# Patient Record
Sex: Female | Born: 1935 | Race: White | Hispanic: No | Marital: Married | Smoking: Never smoker
Health system: Southern US, Community
[De-identification: ages and names within clinical notes are randomized; demographics above are authoritative.]

## PROBLEM LIST (undated history)

## (undated) DIAGNOSIS — I6529 Occlusion and stenosis of unspecified carotid artery: Secondary | ICD-10-CM

## (undated) DIAGNOSIS — K5730 Diverticulosis of large intestine without perforation or abscess without bleeding: Secondary | ICD-10-CM

## (undated) DIAGNOSIS — J189 Pneumonia, unspecified organism: Secondary | ICD-10-CM

## (undated) DIAGNOSIS — I350 Nonrheumatic aortic (valve) stenosis: Secondary | ICD-10-CM

## (undated) DIAGNOSIS — G20A1 Parkinson's disease without dyskinesia, without mention of fluctuations: Secondary | ICD-10-CM

## (undated) DIAGNOSIS — I639 Cerebral infarction, unspecified: Secondary | ICD-10-CM

## (undated) DIAGNOSIS — F419 Anxiety disorder, unspecified: Secondary | ICD-10-CM

## (undated) DIAGNOSIS — M549 Dorsalgia, unspecified: Secondary | ICD-10-CM

## (undated) DIAGNOSIS — E039 Hypothyroidism, unspecified: Secondary | ICD-10-CM

## (undated) DIAGNOSIS — K219 Gastro-esophageal reflux disease without esophagitis: Secondary | ICD-10-CM

## (undated) DIAGNOSIS — N3000 Acute cystitis without hematuria: Secondary | ICD-10-CM

## (undated) DIAGNOSIS — J45909 Unspecified asthma, uncomplicated: Secondary | ICD-10-CM

## (undated) DIAGNOSIS — I10 Essential (primary) hypertension: Secondary | ICD-10-CM

## (undated) DIAGNOSIS — F329 Major depressive disorder, single episode, unspecified: Secondary | ICD-10-CM

## (undated) DIAGNOSIS — F32A Depression, unspecified: Secondary | ICD-10-CM

## (undated) DIAGNOSIS — G20 Parkinson's disease: Secondary | ICD-10-CM

## (undated) DIAGNOSIS — D649 Anemia, unspecified: Secondary | ICD-10-CM

## (undated) DIAGNOSIS — Z8669 Personal history of other diseases of the nervous system and sense organs: Secondary | ICD-10-CM

## (undated) DIAGNOSIS — E785 Hyperlipidemia, unspecified: Secondary | ICD-10-CM

## (undated) DIAGNOSIS — M1990 Unspecified osteoarthritis, unspecified site: Secondary | ICD-10-CM

## (undated) DIAGNOSIS — N390 Urinary tract infection, site not specified: Secondary | ICD-10-CM

## (undated) DIAGNOSIS — W19XXXA Unspecified fall, initial encounter: Secondary | ICD-10-CM

## (undated) DIAGNOSIS — I679 Cerebrovascular disease, unspecified: Secondary | ICD-10-CM

## (undated) DIAGNOSIS — G459 Transient cerebral ischemic attack, unspecified: Secondary | ICD-10-CM

## (undated) DIAGNOSIS — J449 Chronic obstructive pulmonary disease, unspecified: Secondary | ICD-10-CM

## (undated) DIAGNOSIS — R0602 Shortness of breath: Secondary | ICD-10-CM

## (undated) DIAGNOSIS — F0390 Unspecified dementia without behavioral disturbance: Secondary | ICD-10-CM

## (undated) DIAGNOSIS — I272 Pulmonary hypertension, unspecified: Secondary | ICD-10-CM

## (undated) DIAGNOSIS — J209 Acute bronchitis, unspecified: Secondary | ICD-10-CM

## (undated) HISTORY — DX: Nonrheumatic aortic (valve) stenosis: I35.0

## (undated) HISTORY — DX: Anxiety disorder, unspecified: F41.9

## (undated) HISTORY — DX: Diverticulosis of large intestine without perforation or abscess without bleeding: K57.30

## (undated) HISTORY — DX: Gastro-esophageal reflux disease without esophagitis: K21.9

## (undated) HISTORY — DX: Hypothyroidism, unspecified: E03.9

## (undated) HISTORY — DX: Chronic obstructive pulmonary disease, unspecified: J44.9

## (undated) HISTORY — DX: Essential (primary) hypertension: I10

## (undated) HISTORY — DX: Unspecified asthma, uncomplicated: J45.909

## (undated) HISTORY — DX: Depression, unspecified: F32.A

## (undated) HISTORY — PX: COLECTOMY: SHX59

## (undated) HISTORY — DX: Dorsalgia, unspecified: M54.9

## (undated) HISTORY — DX: Acute cystitis without hematuria: N30.00

## (undated) HISTORY — DX: Unspecified osteoarthritis, unspecified site: M19.90

## (undated) HISTORY — DX: Acute bronchitis, unspecified: J20.9

## (undated) HISTORY — DX: Personal history of other diseases of the nervous system and sense organs: Z86.69

## (undated) HISTORY — PX: VAGINAL HYSTERECTOMY: SUR661

## (undated) HISTORY — DX: Pulmonary hypertension, unspecified: I27.20

## (undated) HISTORY — DX: Major depressive disorder, single episode, unspecified: F32.9

## (undated) HISTORY — PX: TUBAL LIGATION: SHX77

## (undated) HISTORY — DX: Transient cerebral ischemic attack, unspecified: G45.9

## (undated) HISTORY — PX: FOOT SURGERY: SHX648

## (undated) HISTORY — DX: Cerebrovascular disease, unspecified: I67.9

## (undated) HISTORY — PX: ENDARTERECTOMY: SHX5162

## (undated) HISTORY — DX: Anemia, unspecified: D64.9

## (undated) HISTORY — PX: REPLACEMENT TOTAL KNEE: SUR1224

---

## 2001-11-11 ENCOUNTER — Ambulatory Visit (HOSPITAL_COMMUNITY): Admission: RE | Admit: 2001-11-11 | Discharge: 2001-11-11 | Payer: Self-pay | Admitting: Pulmonary Disease

## 2001-11-11 ENCOUNTER — Encounter: Payer: Self-pay | Admitting: Pulmonary Disease

## 2001-12-17 ENCOUNTER — Ambulatory Visit (HOSPITAL_COMMUNITY): Admission: RE | Admit: 2001-12-17 | Discharge: 2001-12-17 | Payer: Self-pay | Admitting: Gastroenterology

## 2001-12-24 ENCOUNTER — Encounter: Payer: Self-pay | Admitting: Gastroenterology

## 2001-12-24 ENCOUNTER — Encounter: Admission: RE | Admit: 2001-12-24 | Discharge: 2001-12-24 | Payer: Self-pay | Admitting: Gastroenterology

## 2002-01-19 ENCOUNTER — Encounter: Payer: Self-pay | Admitting: General Surgery

## 2002-01-23 ENCOUNTER — Inpatient Hospital Stay (HOSPITAL_COMMUNITY): Admission: RE | Admit: 2002-01-23 | Discharge: 2002-01-28 | Payer: Self-pay | Admitting: General Surgery

## 2002-01-23 ENCOUNTER — Encounter (INDEPENDENT_AMBULATORY_CARE_PROVIDER_SITE_OTHER): Payer: Self-pay | Admitting: Specialist

## 2003-03-11 ENCOUNTER — Inpatient Hospital Stay (HOSPITAL_COMMUNITY): Admission: EM | Admit: 2003-03-11 | Discharge: 2003-03-12 | Payer: Self-pay | Admitting: Emergency Medicine

## 2003-03-18 ENCOUNTER — Ambulatory Visit (HOSPITAL_COMMUNITY): Admission: RE | Admit: 2003-03-18 | Discharge: 2003-03-18 | Payer: Self-pay | Admitting: Cardiology

## 2004-09-29 ENCOUNTER — Ambulatory Visit: Payer: Self-pay | Admitting: Pulmonary Disease

## 2004-10-03 ENCOUNTER — Ambulatory Visit (HOSPITAL_COMMUNITY): Admission: RE | Admit: 2004-10-03 | Discharge: 2004-10-03 | Payer: Self-pay | Admitting: Pulmonary Disease

## 2004-10-05 ENCOUNTER — Ambulatory Visit: Payer: Self-pay

## 2004-10-16 ENCOUNTER — Ambulatory Visit: Payer: Self-pay | Admitting: Pulmonary Disease

## 2004-11-16 ENCOUNTER — Ambulatory Visit: Payer: Self-pay | Admitting: Pulmonary Disease

## 2005-02-15 ENCOUNTER — Ambulatory Visit: Payer: Self-pay | Admitting: Pulmonary Disease

## 2005-02-21 ENCOUNTER — Ambulatory Visit (HOSPITAL_COMMUNITY): Admission: RE | Admit: 2005-02-21 | Discharge: 2005-02-21 | Payer: Self-pay | Admitting: Pulmonary Disease

## 2005-03-20 ENCOUNTER — Ambulatory Visit (HOSPITAL_COMMUNITY): Admission: RE | Admit: 2005-03-20 | Discharge: 2005-03-20 | Payer: Self-pay | Admitting: Gastroenterology

## 2005-06-13 ENCOUNTER — Ambulatory Visit: Payer: Self-pay | Admitting: Pulmonary Disease

## 2005-10-05 ENCOUNTER — Ambulatory Visit: Payer: Self-pay

## 2006-02-20 ENCOUNTER — Ambulatory Visit: Payer: Self-pay | Admitting: Pulmonary Disease

## 2006-02-21 ENCOUNTER — Ambulatory Visit: Payer: Self-pay | Admitting: Pulmonary Disease

## 2006-07-17 ENCOUNTER — Encounter: Admission: RE | Admit: 2006-07-17 | Discharge: 2006-07-17 | Payer: Self-pay | Admitting: General Surgery

## 2006-08-12 ENCOUNTER — Inpatient Hospital Stay (HOSPITAL_COMMUNITY): Admission: RE | Admit: 2006-08-12 | Discharge: 2006-08-15 | Payer: Self-pay | Admitting: Orthopedic Surgery

## 2006-10-24 ENCOUNTER — Ambulatory Visit: Payer: Self-pay

## 2007-08-13 ENCOUNTER — Telehealth: Payer: Self-pay | Admitting: Pulmonary Disease

## 2007-08-15 DIAGNOSIS — E039 Hypothyroidism, unspecified: Secondary | ICD-10-CM

## 2007-08-15 DIAGNOSIS — K219 Gastro-esophageal reflux disease without esophagitis: Secondary | ICD-10-CM

## 2007-08-15 DIAGNOSIS — M545 Low back pain: Secondary | ICD-10-CM

## 2007-08-15 DIAGNOSIS — I10 Essential (primary) hypertension: Secondary | ICD-10-CM

## 2007-08-15 DIAGNOSIS — M1990 Unspecified osteoarthritis, unspecified site: Secondary | ICD-10-CM

## 2007-08-15 DIAGNOSIS — F411 Generalized anxiety disorder: Secondary | ICD-10-CM

## 2007-08-15 DIAGNOSIS — F329 Major depressive disorder, single episode, unspecified: Secondary | ICD-10-CM

## 2007-10-02 ENCOUNTER — Encounter: Payer: Self-pay | Admitting: Pulmonary Disease

## 2007-10-02 ENCOUNTER — Ambulatory Visit: Payer: Self-pay

## 2007-11-13 ENCOUNTER — Telehealth (INDEPENDENT_AMBULATORY_CARE_PROVIDER_SITE_OTHER): Payer: Self-pay | Admitting: *Deleted

## 2007-11-19 ENCOUNTER — Ambulatory Visit: Payer: Self-pay | Admitting: Pulmonary Disease

## 2007-11-19 DIAGNOSIS — J42 Unspecified chronic bronchitis: Secondary | ICD-10-CM | POA: Insufficient documentation

## 2007-11-19 DIAGNOSIS — K5730 Diverticulosis of large intestine without perforation or abscess without bleeding: Secondary | ICD-10-CM | POA: Insufficient documentation

## 2007-11-19 DIAGNOSIS — I679 Cerebrovascular disease, unspecified: Secondary | ICD-10-CM

## 2007-11-23 LAB — CONVERTED CEMR LAB
AST: 22 units/L (ref 0–37)
Alkaline Phosphatase: 53 units/L (ref 39–117)
BUN: 21 mg/dL (ref 6–23)
Basophils Absolute: 0 10*3/uL (ref 0.0–0.1)
Bilirubin, Direct: 0.1 mg/dL (ref 0.0–0.3)
Eosinophils Absolute: 0.1 10*3/uL (ref 0.0–0.7)
GFR calc non Af Amer: 58 mL/min
Glucose, Bld: 100 mg/dL — ABNORMAL HIGH (ref 70–99)
HCT: 34.6 % — ABNORMAL LOW (ref 36.0–46.0)
Hemoglobin: 12 g/dL (ref 12.0–15.0)
Lymphocytes Relative: 37 % (ref 12.0–46.0)
Monocytes Absolute: 0.4 10*3/uL (ref 0.1–1.0)
Monocytes Relative: 7.7 % (ref 3.0–12.0)
Neutro Abs: 2.6 10*3/uL (ref 1.4–7.7)
Neutrophils Relative %: 53.9 % (ref 43.0–77.0)
Total Protein: 7.2 g/dL (ref 6.0–8.3)
WBC: 5 10*3/uL (ref 4.5–10.5)

## 2008-02-11 ENCOUNTER — Telehealth (INDEPENDENT_AMBULATORY_CARE_PROVIDER_SITE_OTHER): Payer: Self-pay | Admitting: *Deleted

## 2008-08-24 ENCOUNTER — Encounter: Payer: Self-pay | Admitting: Pulmonary Disease

## 2008-10-08 ENCOUNTER — Ambulatory Visit: Payer: Self-pay | Admitting: Pulmonary Disease

## 2008-10-08 ENCOUNTER — Ambulatory Visit: Payer: Self-pay

## 2008-10-20 ENCOUNTER — Telehealth (INDEPENDENT_AMBULATORY_CARE_PROVIDER_SITE_OTHER): Payer: Self-pay | Admitting: *Deleted

## 2008-10-29 ENCOUNTER — Telehealth: Payer: Self-pay | Admitting: Pulmonary Disease

## 2008-11-02 ENCOUNTER — Ambulatory Visit: Payer: Self-pay | Admitting: Pulmonary Disease

## 2008-11-02 ENCOUNTER — Telehealth: Payer: Self-pay | Admitting: Pulmonary Disease

## 2008-11-03 ENCOUNTER — Telehealth (INDEPENDENT_AMBULATORY_CARE_PROVIDER_SITE_OTHER): Payer: Self-pay | Admitting: *Deleted

## 2008-11-16 ENCOUNTER — Ambulatory Visit: Payer: Self-pay | Admitting: Pulmonary Disease

## 2008-11-29 ENCOUNTER — Ambulatory Visit: Payer: Self-pay | Admitting: Pulmonary Disease

## 2008-12-03 LAB — CONVERTED CEMR LAB
ALT: 13 units/L (ref 0–35)
AST: 16 units/L (ref 0–37)
Albumin: 3.3 g/dL — ABNORMAL LOW (ref 3.5–5.2)
Alkaline Phosphatase: 58 units/L (ref 39–117)
BUN: 14 mg/dL (ref 6–23)
Bilirubin, Direct: 0.1 mg/dL (ref 0.0–0.3)
CO2: 30 meq/L (ref 19–32)
Cholesterol: 193 mg/dL (ref 0–200)
Creatinine, Ser: 0.8 mg/dL (ref 0.4–1.2)
GFR calc non Af Amer: 75.08 mL/min (ref >60)
HDL: 74.5 mg/dL (ref >39.00)
LDL Cholesterol: 99 mg/dL (ref 0–99)
Lymphocytes Relative: 32.2 % (ref 12.0–46.0)
Monocytes Relative: 9.1 % (ref 3.0–12.0)
Neutro Abs: 2.6 10*3/uL (ref 1.4–7.7)
Neutrophils Relative %: 55.6 % (ref 43.0–77.0)
Platelets: 281 10*3/uL (ref 150.0–400.0)
Sodium: 143 meq/L (ref 135–145)
Total Protein: 6.8 g/dL (ref 6.0–8.3)

## 2008-12-30 ENCOUNTER — Ambulatory Visit: Payer: Self-pay | Admitting: Surgery

## 2009-01-04 ENCOUNTER — Ambulatory Visit: Payer: Self-pay | Admitting: Vascular Surgery

## 2009-01-10 ENCOUNTER — Ambulatory Visit: Payer: Self-pay | Admitting: Pulmonary Disease

## 2009-01-10 DIAGNOSIS — G459 Transient cerebral ischemic attack, unspecified: Secondary | ICD-10-CM | POA: Insufficient documentation

## 2009-01-10 DIAGNOSIS — H53139 Sudden visual loss, unspecified eye: Secondary | ICD-10-CM

## 2009-01-12 ENCOUNTER — Ambulatory Visit: Payer: Self-pay | Admitting: Vascular Surgery

## 2009-01-12 ENCOUNTER — Inpatient Hospital Stay (HOSPITAL_COMMUNITY): Admission: RE | Admit: 2009-01-12 | Discharge: 2009-01-13 | Payer: Self-pay | Admitting: Vascular Surgery

## 2009-01-12 ENCOUNTER — Encounter: Payer: Self-pay | Admitting: Vascular Surgery

## 2009-01-13 HISTORY — PX: CARDIOTHORACIC PROCEDURE: SHX1298

## 2009-02-01 ENCOUNTER — Ambulatory Visit: Payer: Self-pay | Admitting: Vascular Surgery

## 2009-07-11 ENCOUNTER — Encounter: Payer: Self-pay | Admitting: Pulmonary Disease

## 2009-07-26 ENCOUNTER — Telehealth: Payer: Self-pay | Admitting: Pulmonary Disease

## 2009-08-05 ENCOUNTER — Ambulatory Visit: Payer: Self-pay | Admitting: Vascular Surgery

## 2009-10-13 ENCOUNTER — Telehealth (INDEPENDENT_AMBULATORY_CARE_PROVIDER_SITE_OTHER): Payer: Self-pay | Admitting: *Deleted

## 2010-02-07 ENCOUNTER — Ambulatory Visit: Payer: Self-pay | Admitting: Vascular Surgery

## 2010-07-12 ENCOUNTER — Encounter: Payer: Self-pay | Admitting: Pulmonary Disease

## 2010-08-29 NOTE — Progress Notes (Signed)
Summary: rx's  Phone Note Call from Patient Call back at Home Phone [phone]   Caller: Patient Call For: [name] Reason for Call: Talk to Nurse Summary of Call: pt needs written rx to take to [facility] for : Prilosec 20mg , Premorin 9 mg, Cozaar 50mg  & Synthroid 100mg .  Pt going next week.  Please mail rx to her home. Initial call taken by: Eugene Gavia,  October 13, 2009 9:23 AM  Follow-up for Phone Call        printed all requested rx's for sn to sign except premarin I have called pt but no ansewer on pt's home phone, I don't show where sn has filled premarin for pt before.  Philipp Deputy [facility]  October 13, 2009 10:27 AM    called and spoke with pt and she is aware that the rx for meds have been mailed to her---except for the premarin and she will call Dr. Arlyce Dice for this med. Randell Loop CMA  October 13, 2009 4:34 PM     Prescriptions: PRILOSEC OTC 20 MG  TBEC (OMEPRAZOLE MAGNESIUM) Take 1 tablet by mouth once a day  #90 x 3   Entered by:   Philipp Deputy CMA   Authorized by:   Michele Mcalpine MD   Signed by:   Philipp Deputy CMA on 10/13/2009   Method used:   Print then Give to Patient   RxID:   [id number] SYNTHROID 100 MCG  TABS (LEVOTHYROXINE SODIUM) Take 1 tablet by mouth once a day  #90 x 3   Entered by:   Philipp Deputy CMA   Authorized by:   Michele Mcalpine MD   Signed by:   Philipp Deputy CMA on 10/13/2009   Method used:   Print then Give to Patient   RxID:   [id number] COZAAR 50 MG  TABS (LOSARTAN POTASSIUM) Take 1 tablet by mouth once a day  #90 x 3   Entered by:   Philipp Deputy CMA   Authorized by:   Michele Mcalpine MD   Signed by:   Philipp Deputy CMA on 10/13/2009   Method used:   Print then Give to Patient   RxID:   [id number]

## 2010-08-31 NOTE — Letter (Signed)
[facility]   Imported By: Sherian Rein 07/20/2010 14:10:28  _____________________________________________________________________  External Attachment:    Type:   Image     Comment:   External Document

## 2010-10-09 ENCOUNTER — Encounter: Payer: Self-pay | Admitting: Pulmonary Disease

## 2010-10-09 ENCOUNTER — Other Ambulatory Visit: Payer: Self-pay | Admitting: Pulmonary Disease

## 2010-10-09 ENCOUNTER — Ambulatory Visit (INDEPENDENT_AMBULATORY_CARE_PROVIDER_SITE_OTHER)
Admission: RE | Admit: 2010-10-09 | Discharge: 2010-10-09 | Disposition: A | Discharge status: Home / Self Care | Payer: Medicare Other | Source: Ambulatory Visit | Attending: Pulmonary Disease | Admitting: Pulmonary Disease

## 2010-10-09 ENCOUNTER — Other Ambulatory Visit: Payer: Medicare Other

## 2010-10-09 ENCOUNTER — Ambulatory Visit (INDEPENDENT_AMBULATORY_CARE_PROVIDER_SITE_OTHER): Payer: Medicare Other | Admitting: Pulmonary Disease

## 2010-10-09 DIAGNOSIS — I10 Essential (primary) hypertension: Secondary | ICD-10-CM

## 2010-10-09 DIAGNOSIS — J42 Unspecified chronic bronchitis: Secondary | ICD-10-CM

## 2010-10-09 DIAGNOSIS — E039 Hypothyroidism, unspecified: Secondary | ICD-10-CM

## 2010-10-09 DIAGNOSIS — G459 Transient cerebral ischemic attack, unspecified: Secondary | ICD-10-CM

## 2010-10-09 DIAGNOSIS — I679 Cerebrovascular disease, unspecified: Secondary | ICD-10-CM

## 2010-10-09 DIAGNOSIS — K219 Gastro-esophageal reflux disease without esophagitis: Secondary | ICD-10-CM

## 2010-10-09 DIAGNOSIS — M1990 Unspecified osteoarthritis, unspecified site: Secondary | ICD-10-CM

## 2010-10-09 DIAGNOSIS — K5730 Diverticulosis of large intestine without perforation or abscess without bleeding: Secondary | ICD-10-CM

## 2010-10-09 DIAGNOSIS — M545 Low back pain: Secondary | ICD-10-CM

## 2010-10-09 DIAGNOSIS — J45909 Unspecified asthma, uncomplicated: Secondary | ICD-10-CM | POA: Insufficient documentation

## 2010-10-09 LAB — HEPATIC FUNCTION PANEL
ALT: 10 U/L (ref 0–35)
AST: 13 U/L (ref 0–37)
Albumin: 3.6 g/dL (ref 3.5–5.2)
Alkaline Phosphatase: 60 U/L (ref 39–117)
Bilirubin, Direct: 0.1 mg/dL (ref 0.0–0.3)
Total Protein: 7.1 g/dL (ref 6.0–8.3)

## 2010-10-09 LAB — CBC WITH DIFFERENTIAL/PLATELET
Basophils Absolute: 0 10*3/uL (ref 0.0–0.1)
Eosinophils Absolute: 0.2 10*3/uL (ref 0.0–0.7)
Lymphocytes Relative: 32.5 % (ref 12.0–46.0)
Monocytes Absolute: 0.5 10*3/uL (ref 0.1–1.0)
Neutro Abs: 3.4 10*3/uL (ref 1.4–7.7)
Neutrophils Relative %: 56.3 % (ref 43.0–77.0)
Platelets: 285 10*3/uL (ref 150.0–400.0)
RBC: 3.61 Mil/uL — ABNORMAL LOW (ref 3.87–5.11)
RDW: 14.9 % — ABNORMAL HIGH (ref 11.5–14.6)

## 2010-10-09 LAB — BASIC METABOLIC PANEL
CO2: 29 mEq/L (ref 19–32)
Calcium: 8.9 mg/dL (ref 8.4–10.5)
GFR: 73.63 mL/min (ref >60.00)
Glucose, Bld: 86 mg/dL (ref 70–99)

## 2010-10-09 LAB — TSH: TSH: 0.46 u[IU]/mL (ref 0.35–5.50)

## 2010-10-12 ENCOUNTER — Telehealth: Payer: Self-pay | Admitting: Pulmonary Disease

## 2010-10-17 NOTE — Progress Notes (Signed)
Summary: results-pt returned call  Phone Note Call from Patient Call back at Home Phone [phone]   Caller: Patient Call For: Takisha Pelle Summary of Call: pt wants results of labs/ cxr from 10/09/10 Initial call taken by: Tivis Ringer, CNA,  October 12, 2010 3:15 PM  Follow-up for Phone Call        pt is calling requesting her lab and cxr results that were done on 10/09/2010.  Results are unsigned.  Please advise.  Thank you.  Aundra Millet Reynolds LPN  October 12, 2010 3:16 PM    attempted to call pt with results---line is busy ...will try again later Randell Loop CMA  October 12, 2010 4:07 PM   pt returned call. Tivis Ringer, CNA  October 13, 2010 1:22 PM attmpted to call pt again with results of labs/cxr----no answer this time---machine picks up and states system on---will attempt to call pt back Randell Loop CMA  October 12, 2010 4:37 PM   Additional Follow-up for Phone Call Additional follow up Details #1::        called and spoke with pt about her lab results----PFT--with combined restriction and mild obstruction---all labs look ok---mild anemia that we will follow up with----cxr with some old scarring  NAD no change---pt voiced her understanding of this and will call back for any questions or concerns Randell Loop CMA  October 13, 2010 1:39 PM

## 2010-10-17 NOTE — Assessment & Plan Note (Signed)
Summary: OV SOB//SH   CC:  21 month ROV & review of mult medical problems....  History of Present Illness: 76 y/o WF here for a follow up visit... she is followed both here and at the [place] where she gets her meds ([facility])... she has mult med problems including hx refractory AB requiring Advair, Ventolin, Mucinex;  Cerebrovas dis w/ right CAE 6/10 by DrLawson;  Hypothy on synthroid;  GERD & Divertics w/ sigm colectomy 2003 for diverticulitis;  DJD w/ right TKR 2008 by DrAlusio; and Anxiety...   ~  January 10, 2009:  she developed loss of vision in right eye on 6/1 and was eval by [facility] DrMatthews... also had TIA manifestation w/ transient slurring of speech and ? arm weakness... sent to [facility] w/ repeat CDoppler showing similar 60-79% right stenosis + 40-59% LICA stenosis... she is set up for right CAE on 6/16... pre-op labs done today w/ +UTI- we will Rx w/ Cipro...   ~  October 09, 2010:  92mo ROV- she continues to get her meds at [facility] notes sl incr SOB/ DOE recently- hard for her to describe "I have the old asthma cough", no phlegm, no CP or cardiac symptoms;   we decided to recheck her CXR (COPD, mild scarring, cardiomeg, NAD) & PFT (more restricted than obstructed);  refill meds, discussed incr exerc program...    BP controlled on Losartan 50mg /d w/ BP= 130/82;  denies CP, palpit, edema, etc> and there are no cerebral ischemic symptoms (remains on ASA 81mg /d)...    Other medical problems remain stable>  Thyroid OK on Synth100 w/ TSH= 0.46;  GERD controlled w/ Prilosec & hx sigm colectomy for diverticulitis & ?when due for f/u colon (she will check w/ [facility] as DrWeissman has retired);  her GYN is Sports administrator & she tells me she saw DrKimbrough for UTIs but we don't have notes from him;  DJD & LBP followed by DrALusio (prev right TKR 2008);  still under incredible stress w/ grandsons critical illness (testic cancer surg then necrotizing fasciitis)...    Current Problems:   BRONCHITIS, RECURRENT  (ICD-491.9) - she is a non-smoker... episode refractory AB finally resolved 4/10 w/ Ab's/ Pred/ Mucinex/ etc...  HYPERTENSION (ICD-401.9) - on COZAAR 50mg /d... BP = 120/70 today and she says similar at home... takes med regularly and tol well... denies HA, fatigue, visual changes, CP, palipit, dizziness, syncope, dyspnea, edema, etc...  ~  2DEcho 8/91 showed mild MR, trace AI, EF=60%...  ~  NuclearStressTest 11/95 was normal...   CEREBROVASCULAR DISEASE (ICD-437.9) - on ASA 81mg /d... she has hx small vessel ischemic dis and has been seen by DrWillis for Neuro w/ hx amaurosis fugax in 2006... 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 CDopplers w/ progressive LICA stenosis.  ~  MRI Brain 2006 w/ sm vessel ischemic dis & 3mm intracavernous carotid aneurym- being followed.  ~  CDopplers 3/08 showed bilat ICA plaque, 0-39% bilat stenoses (no change from prev).  ~  CDopplers 3/10 showed mod plaque in prox ICAs bilat w/ 60-79% RICA & 0-39% LICA stenoses.  ~  6/10: developed loss of vision in left eye and transient slurring of speech- ophthal eval by DrMatthews & vasc eval DrLawson w/ repeat CDopplers showing 60-79% LICA stenosis, & 40-59% RICA stenosis ==> right RAE 6/10.  ~  CDopplers followed by DrLawson's office since her surg & stable...  HYPOTHYROIDISM (ICD-244.9) - on SYNTHROID 143mcg/d... feeling well, energy is good.  GERD (ICD-530.81) - on PRILOSEC 20mg /d... GI=DrWeissman & last EGD  8/06 showed esoph stricture- dilated... rec for Prilosec daily...   DIVERTICULOSIS OF COLON (ICD-562.10) - she is s/p sigmoid colectomy 6/03 by DrHoxworth... ? if she has had a f/u colonoscopy... she will check w/ DrWeissman for this.  DEGENERATIVE JOINT DISEASE (ICD-715.90) - know CSpine DJD w/ osteophytes on prev scan... she is s/p right TKR 1/08 by DrAlusio...  BACK PAIN, LUMBAR (ICD-724.2) - she takes ALEVE & VICODIN as needed.  ANXIETY (ICD-300.00) DEPRESSION (ICD-311) - she takes cranberry  extract for bladder stem problems- OK... not on anxiety or depression meds but under considerable stress w/ grandson's illness...   Preventive Screening-Counseling & Management  Alcohol-Tobacco     Smoking Status: never  Allergies: 1)  ! Penicillin 2)  ! * Relefen 3)  ! Zachery Dakins  Past History:  Past Medical History: Hx of SUDDEN VISUAL LOSS (ICD-368.11) ACUTE CYSTITIS (ICD-595.0) BRONCHITIS, RECURRENT (ICD-491.9) HYPERTENSION (ICD-401.9) CEREBROVASCULAR DISEASE (ICD-437.9) HYPOTHYROIDISM (ICD-244.9) GERD (ICD-530.81) DIVERTICULOSIS OF COLON (ICD-562.10) DEGENERATIVE JOINT DISEASE (ICD-715.90) BACK PAIN, LUMBAR (ICD-724.2) TRANSIENT ISCHEMIC ATTACK (ICD-435.9) ANXIETY (ICD-300.00) DEPRESSION (ICD-311)  Past Surgical History: S/P hysterectomy S/P sigmoid colectomyy for diverticulitis - 6/03 by DrHoxworth S/P right TKR 1/08 by DrAlusio  Family History: Reviewed history from 11/16/2008 and no changes required. Father died age 46 w/ emphysema Mother died age 12 w/ heart disease & CHF 6 Siblings: 1 died at birth 1 sister died age 31 w/ AAA (Orion Smith's wife) 2 sisters alive- w/ hx of Etoh, CAD/ stents, DJD w/ replacements 2 brothers alive- one w/ hx rheumatic fever  Social History: Reviewed history from 11/16/2008 and no changes required. Married, husb= Charles, 55 yrs 3 children (one bipolar), 5 grand children (one w/ psych issues) never smoked social alcohol retired [employer] 2yrs  Review of Systems       The patient complains of dyspnea on exertion, gas/bloating, urinary hesitancy, nocturia, back pain, joint pain, arthritis, difficulty walking, and depression.  The patient denies fever, chills, sweats, anorexia, fatigue, weakness, malaise, weight loss, sleep disorder, blurring, diplopia, eye irritation, eye discharge, vision loss, eye pain, photophobia, earache, ear discharge, tinnitus, decreased hearing, nasal congestion, nosebleeds, sore throat, hoarseness,  chest pain, palpitations, syncope, orthopnea, PND, peripheral edema, cough, dyspnea at rest, excessive sputum, hemoptysis, wheezing, pleurisy, nausea, vomiting, diarrhea, constipation, change in bowel habits, abdominal pain, melena, hematochezia, jaundice, indigestion/heartburn, dysphagia, odynophagia, dysuria, hematuria, urinary frequency, incontinence, joint swelling, muscle cramps, muscle weakness, stiffness, sciatica, restless legs, leg pain at night, leg pain with exertion, rash, itching, dryness, suspicious lesions, paralysis, paresthesias, seizures, tremors, vertigo, transient blindness, frequent falls, frequent headaches, anxiety, memory loss, confusion, cold intolerance, heat intolerance, polydipsia, polyphagia, polyuria, unusual weight change, abnormal bruising, bleeding, enlarged lymph nodes, urticaria, allergic rash, hay fever, and recurrent infections.    Vital Signs:  Patient profile:   76 year old female Height:      66 inches Weight:      194.25 pounds BMI:     31.47 O2 Sat:      98 % on Room air Temp:     97.8 degrees F oral Pulse rate:   77 / minute BP sitting:   130 / 82  (left arm) Cuff size:   regular  Vitals Entered By: Randell Loop CMA (October 09, 2010 2:37 PM)  O2 Sat at Rest %:  98 O2 Flow:  Room air CC: 21 month ROV & review of mult medical problems... Is Patient Diabetic? No Pain Assessment Patient in pain? no      Comments meds updated  today with pt   Physical Exam  Additional Exam:  WD, WN, 76 y/o WF in NAD... GENERAL:  Alert & oriented; pleasant & cooperative... HEENT:  [place]/AT, EOM-wnl, left marcus-gunn pupil, EACs-clear, TMs-wnl, NOSE-clear, THROAT-clear & wnl. NECK:  Supple w/ fairROM; no JVD; normal carotid impulses w/ faint right bruit; no thyromegaly or nodules palpated; no lymphadenopathy. CHEST:  Clear to P & A; without wheezes/ rales/ or rhonchi heard... HEART:  Regular Rhythm; without murmurs/ rubs/ or gallops detected... ABDOMEN:  Soft &  nontender; normal bowel sounds; no organomegaly or masses palpated... EXT: without deformities, mild arthritic changes; no varicose veins/ +venous insuffic/ no edema. NEURO:  no focal neuro deficits x part loss of vision left eye... DERM:  No lesions noted; no rash etc...    MISC. Report  Procedure date:  10/09/2010  Findings:      DATA REVIEWED:  ~  CXR, PFT, non-fasting labs...   ~  CDopplers by Windell Moulding 7/11...   Impression & Recommendations:  Problem # 1:  ASTHMA (ICD-493.90) Hx asthma & recurrent bronchitic infections>  everything quiet recently, continue meds below... SPIROMETRY> FVC=1.75 (56%), FEV1=1.37 (58%), %1sec=78, mid-flows=65+%... Pt asked to continue current meds and incr exercise program (& decr the stress)... Her updated medication list for this problem includes:    Advair Diskus 250-50 Mcg/dose Misc (Fluticasone-salmeterol) .Marland Kitchen... 1 puff two times a day    Ventolin Hfa 108 (90 Base) Mcg/act Aers (Albuterol sulfate) .Marland Kitchen... 1-2 puffs every 4 hours as needed for sob  Orders: Spirometry w/Graph (94010)  Problem # 2:  HYPERTENSION (ICD-401.9) Controlled>  same med. Her updated medication list for this problem includes:    Cozaar 50 Mg Tabs (Losartan potassium) .Marland Kitchen... Take 1 tablet by mouth once a day  Orders: T-1 View CXR (71010TC) TLB-BMP (Basic Metabolic Panel-BMET) (80048-METABOL) TLB-Hepatic/Liver Function Pnl (80076-HEPATIC) TLB-CBC Platelet - w/Differential (85025-CBCD) TLB-TSH (Thyroid Stimulating Hormone) (84443-TSH)  Problem # 3:  CEREBROVASCULAR DISEASE (ICD-437.9) Stable on ASA>  followed by [facility] s/p right CAE...  Problem # 4:  HYPOTHYROIDISM (ICD-244.9) Stable on the synthroid... Her updated medication list for this problem includes:    Synthroid 100 Mcg Tabs (Levothyroxine sodium) .Marland Kitchen... Take 1 tablet by mouth once a day  Problem # 5:  GI >>> Stable 7 she will check w/ [facility] re: f/u colonoscopy...  Problem # 6:  DEGENERATIVE JOINT  DISEASE (ICD-715.90) Hx DJD w/ TKR as noted + LBP & rx below... The following medications were removed from the medication list:    Aleve 220 Mg Tabs (Naproxen sodium) .Marland Kitchen... Take 1 tablet by mouth once a day as needed Her updated medication list for this problem includes:    Adult Aspirin Low Strength 81 Mg Tbdp (Aspirin) .Marland Kitchen... Take 1 tablet by mouth once a day    Hydrocodone-acetaminophen 5-500 Mg Tabs (Hydrocodone-acetaminophen) .Marland Kitchen... Take 1 tab by mouth every 6-8h as needed for pain (not to exceed 3 per day).  Problem # 7:  DEPRESSION (ICD-311) Under stress from hx of grandson's serious illness... she does NOT want anxiolytic meds even when I suggested that it would help w/ her SOB...  Problem # 8:  OTHER MEDICAL PROBLEMS AS NOTED>>>  Complete Medication List: 1)  Advair Diskus 250-50 Mcg/dose Misc (Fluticasone-salmeterol) .Marland Kitchen.. 1 puff two times a day 2)  Ventolin Hfa 108 (90 Base) Mcg/act Aers (Albuterol sulfate) .Marland Kitchen.. 1-2 puffs every 4 hours as needed for sob 3)  Adult Aspirin Low Strength 81 Mg Tbdp (Aspirin) .... Take 1 tablet by mouth once a  day 4)  Cozaar 50 Mg Tabs (Losartan potassium) .... Take 1 tablet by mouth once a day 5)  Fish Oil 1000 Mg Caps (Omega-3 fatty acids) .... Take 1 tablet by mouth once a day 6)  Synthroid 100 Mcg Tabs (Levothyroxine sodium) .... Take 1 tablet by mouth once a day 7)  Prilosec Otc 20 Mg Tbec (Omeprazole magnesium) .... Take 1 tablet by mouth once a day 8)  Premarin 0.9 Mg Tabs (Estrogens conjugated) .... Take 1 tablet by mouth once a day 9)  Calcium 600/vitamin D 600-400 Mg-unit Tabs (Calcium carbonate-vitamin d) .... Take 1 tablet by mouth once a day 10)  Vitamin B-12 Cr 1000 Mcg Tbcr (Cyanocobalamin) .... Take 1 tablets by mouth once daily 11)  Hydrocodone-acetaminophen 5-500 Mg Tabs (Hydrocodone-acetaminophen) .... Take 1 tab by mouth every 6-8h as needed for pain (not to exceed 3 per day).  Patient Instructions: 1)  Today we updated your med  list- see below.... 2)  We refilled your meds for 2012... 3)  Today we did your follow up CXR & PFT.Marland KitchenMarland Kitchen 4)  We also did your follow up nonfasting blood work... 5)  please call the "phone tree" in a few days for your lab results.Marland KitchenMarland Kitchen  6)  Call for any problems.Marland KitchenMarland Kitchen 7)  Please schedule a follow-up appointment in 1 year, sooner as needed... Prescriptions: PRILOSEC OTC 20 MG  TBEC (OMEPRAZOLE MAGNESIUM) Take 1 tablet by mouth once a day  #90 x 4   Entered and Authorized by:   Michele Mcalpine MD   Signed by:   Michele Mcalpine MD on 10/09/2010   Method used:   Print then Give to Patient   RxID:   [id number] SYNTHROID 100 MCG  TABS (LEVOTHYROXINE SODIUM) Take 1 tablet by mouth once a day  #90 x 4   Entered and Authorized by:   Michele Mcalpine MD   Signed by:   Michele Mcalpine MD on 10/09/2010   Method used:   Print then Give to Patient   RxID:   [id number] FISH OIL 1000 MG CAPS (OMEGA-3 FATTY ACIDS) Take 1 tablet by mouth once a day  #90 x 4   Entered and Authorized by:   Michele Mcalpine MD   Signed by:   Michele Mcalpine MD on 10/09/2010   Method used:   Print then Give to Patient   RxID:   [id number] COZAAR 50 MG  TABS (LOSARTAN POTASSIUM) Take 1 tablet by mouth once a day  #90 x 4   Entered and Authorized by:   Michele Mcalpine MD   Signed by:   Michele Mcalpine MD on 10/09/2010   Method used:   Print then Give to Patient   RxID:   [id number] VENTOLIN HFA 108 (90 BASE) MCG/ACT AERS (ALBUTEROL SULFATE) 1-2 puffs every 4 hours as needed for SOB  #3 x 4   Entered and Authorized by:   Michele Mcalpine MD   Signed by:   Michele Mcalpine MD on 10/09/2010   Method used:   Print then Give to Patient   RxID:   [id number] ADVAIR DISKUS 250-50 MCG/DOSE MISC (FLUTICASONE-SALMETEROL) 1 puff two times a day  #3 x 4   Entered and Authorized by:   Michele Mcalpine MD   Signed by:   Michele Mcalpine MD on 10/09/2010   Method used:   Print then Give to Patient   RxID:   [id number]    Immunization  History:  Influenza Immunization History:    Influenza:  historical (05/08/2010)  Pneumovax Immunization History:    Pneumovax:  historical (10/17/2004)

## 2010-11-06 LAB — COMPREHENSIVE METABOLIC PANEL
ALT: 12 U/L (ref 0–35)
Albumin: 3.5 g/dL (ref 3.5–5.2)
CO2: 26 mEq/L (ref 19–32)
Calcium: 8.8 mg/dL (ref 8.4–10.5)
Creatinine, Ser: 0.79 mg/dL (ref 0.4–1.2)
GFR calc non Af Amer: >60 mL/min (ref >60)
Glucose, Bld: 100 mg/dL — ABNORMAL HIGH (ref 70–99)
Potassium: 4 mEq/L (ref 3.5–5.1)
Sodium: 141 mEq/L (ref 135–145)

## 2010-11-06 LAB — BASIC METABOLIC PANEL
BUN: 8 mg/dL (ref 6–23)
GFR calc non Af Amer: >60 mL/min (ref >60)
Glucose, Bld: 118 mg/dL — ABNORMAL HIGH (ref 70–99)

## 2010-11-06 LAB — CBC
HCT: 34.6 % — ABNORMAL LOW (ref 36.0–46.0)
Hemoglobin: 10.2 g/dL — ABNORMAL LOW (ref 12.0–15.0)
MCHC: 35.2 g/dL (ref 30.0–36.0)
MCHC: 35.4 g/dL (ref 30.0–36.0)
MCV: 96.1 fL (ref 78.0–100.0)
MCV: 96.2 fL (ref 78.0–100.0)
Platelets: 197 10*3/uL (ref 150–400)
RDW: 13.6 % (ref 11.5–15.5)
RDW: 13.4 % (ref 11.5–15.5)
WBC: 7.7 10*3/uL (ref 4.0–10.5)

## 2010-11-06 LAB — URINE CULTURE
Colony Count: NO GROWTH
Culture: NO GROWTH
Special Requests: POSITIVE

## 2010-11-06 LAB — URINALYSIS, ROUTINE W REFLEX MICROSCOPIC
Glucose, UA: NEGATIVE mg/dL
Ketones, ur: NEGATIVE mg/dL
Protein, ur: NEGATIVE mg/dL
Urobilinogen, UA: 0.2 mg/dL (ref 0.0–1.0)
pH: 5.5 (ref 5.0–8.0)

## 2010-11-06 LAB — APTT: aPTT: 26 seconds (ref 24–37)

## 2010-11-06 LAB — TYPE AND SCREEN

## 2010-11-06 LAB — PROTIME-INR: Prothrombin Time: 12.5 seconds (ref 11.6–15.2)

## 2010-11-06 LAB — URINE MICROSCOPIC-ADD ON

## 2010-11-06 LAB — ABO/RH: ABO/RH(D): AB POS

## 2010-12-12 NOTE — Discharge Summary (Signed)
NAME:  [name], [name]                ACCOUNT NO.:  [id number]   MEDICAL RECORD NO.:  [id number]          PATIENT TYPE:  INP   LOCATION:  3309                         FACILITY:  [facility]   PHYSICIAN:  Quita Skye. Hart Rochester, M.D.  DATE OF BIRTH:  [birth year]   DATE OF ADMISSION:  01/12/2009  DATE OF DISCHARGE:  01/13/2009                               DISCHARGE SUMMARY   ADMISSION DIAGNOSES:  1. Right retinal embolus with right carotid occlusive disease.  2. Urinary tract infection, on Septra.   FINAL DISCHARGE DIAGNOSES:  1. Right retinal embolus with right carotid occlusive disease, status      post right carotid endarterectomy.  2. Urinary tract infection, on Septra.  3. Hyperlipidemia.  4. Hypertension.  5. History of asthma.  6. History of right total knee replacement.  7. History of partial colon resection for diverticulosis.  8. Hysterectomy.  9. History of tubal ligation.   ALLERGIES:  PENICILLIN, RELAFEN, and ORUVAIL.   PROCEDURES:  January 12, 2009, right carotid endarterectomy with Dacron  patch angioplasty by Dr. Josephina Gip.   BRIEF HISTORY:  [name] is a 76 year old Caucasian female, who  suffered partial blindness in her right eye on Dec 24, 2008, with  previous morning.  She had no further other neurologic deficits, was  found to have a retinal embolus by ophthalmologist evaluation with Dr.  Alan Mulder.  She also states she had episode of right hand became  mildly clumsy and another episode of some mild speech irregularity  lasting a very short time.  She was found to have bilateral carotid  occlusive disease at the right side, more severe than the left and the  left being relatively mild.  Dr. Hart Rochester recommended right carotid  endarterectomy to reduce her risk for future stroke.   HOSPITAL COURSE:  [name] was electively admitted to [facility] [place] on January 11, 2009, and she underwent the previously mentioned  procedure.  Preoperative labs did reveal positive  urinalysis, and her  family physician put her on Septra.  Postoperatively, she was extubated  neurologically intact.  She does have persistent visual deficit in her  right eye.  Few hours after surgery, she did have very severe headache,  systolic blood pressure around that the time between 150 and 160.  She  had no neurologic deficits, however, and ultimately this resolved with  IV Dilaudid.  Postoperative day 1, she remained hemodynamically stable.   VITAL SIGNS: 120/43, oxygen saturation 97%, heart rate around 59-71 in  sinus rhythm.  She is afebrile.   Labs showed a white count of 7.7, hemoglobin 10.2, hematocrit 28.9, and  platelet count 197.  Sodium 140, potassium 4, BUN of 8, creatinine 0.69,  and blood glucose of 118.   We had asked for urine culture that is still pending.  At morning, she  reported she had little appetite, but did not have dysphagia, nausea, or  vomiting.  She also fell a little loudly on her feet, but felt that was  improving.  We will watch her through the morning if she continues to  progress  and feels her ambulation is improving.  We anticipate she will  be ready for discharge home later on postoperative day #1, January 13, 2009.  Currently, she remained stable, improving condition.  Tongue is  midline.  Incision shows no evidence of hematoma.   DISCHARGE MEDICATIONS:  She will resume her home medications which are;  1. Premarin at her home dose daily nightly.  2. Synthroid at her home dose daily.  3. Cozaar 50 mg daily.  4. Aspirin 81 mg daily.  5. Prilosec 20 mg daily.  6. Vitamin B12 1000 mcg daily.  7. Calcium with vitamin D 600 mg daily.  8. Aleve daily as needed.  9. Advair inhaler 2 puffs b.i.d. p.r.n. per home regimen.  10.Ventolin HFA 1-2 puffs as needed per home regimen.  11.Septra as prescribed for her urinary tract infection.  She      completes her full course.  12.Tylox 1 tablet p.o. q.4 h. p.r.n. pain.   DISCHARGE INSTRUCTIONS:  She  continue a heart-healthy diet.  May shower  and clean her incisions gently with soap and water.  Call if she has  fever greater than 101, redness or drainage from her incision site,  severe headache, or new neurologic changes.  Otherwise, she could see  Dr. Hart Rochester in 2-3 weeks.  Avoid driving or heavy lifting until that  time.  She is encouraged to continue daily walking exercises.      Jerold Coombe, P.A.      Quita Skye Hart Rochester, M.D.  Electronically Signed    AWZ/MEDQ  D:  01/13/2009  T:  01/13/2009  Job:  [id number]   cc:   Beulah Gandy. Ashley Royalty, M.D.  Lonzo Cloud. Kriste Basque, MD

## 2010-12-12 NOTE — H&P (Signed)
HISTORY AND PHYSICAL EXAMINATION   January 04, 2009   Re:  [name], [name]                DOB:  04/19/1935   CHIEF COMPLAINT:  Right retinal embolus with partial blindness right  eye.   HISTORY OF PRESENT ILLNESS:  This 76 year old female on May 28 developed  acute onset of loss of vision in the right visual field (lower half).  This has not returned.  She was evaluated by Dr. Alan [name] who noted  a retinal embolus on his examination and was referred for further  evaluation.  She also denies any episodes of transient weakness on the  left side of her body but does state that she had an episode where her  right hand became mild clumsy and also another episode where her speech  possibly became slurred for a very short period of time (less than a few  minutes).  This is less clear-cut.  She has no history of stroke.   PAST MEDICAL HISTORY:  1. Hypertension.  2. Hyperlipidemia.  3. Asthma.  4. Negative for diabetes, coronary artery disease, COPD or stroke.   PAST SURGICAL HISTORY:  1. Right total knee replacement.  2. Partial colon resection for diverticulosis.  3. Hysterectomy.  4. Tubal ligation.   FAMILY HISTORY:  Positive for coronary artery disease in her mother who  had coronary artery bypass grafting, diabetes in her mother and ruptured  aneurysm in a sister.  Negative for stroke.   SOCIAL HISTORY:  The patient is married, has three children, is retired.  She does not use tobacco or alcohol.   REVIEW OF SYSTEMS:  Occasional dyspnea on exertion secondary to her  asthma.  Has had reflux esophagitis in the past.  Arthritis and change  in her recent vision as noted in history and physical/present illness.   ALLERGIES:  To penicillin, Oruvail and Relafen.   MEDICATIONS:  1. Premarin dose unknown one daily.  2. Synthroid dose unknown one daily.  3. Aspirin 81 mg one daily.  4. Prilosec dose unknown one daily.  5. Calcium 600 mEq with vitamin D one  daily.  6. B12 1000 mg one daily.  7. Cozaar 50 mg one daily.  8. Advair inhalant 2 puffs a day p.r.n.  9. Ventolin HFA one to two puffs q.4 h p.r.n.   PHYSICAL EXAMINATION:  Vital signs:  Blood pressure is 160/78, heart  rate 73, respirations 14.  General:  She is a healthy-appearing female  in no apparent distress, alert and oriented x3.  Neck:  Is supple, 3+  carotid pulse is palpable.  No bruits are audible.  Neurologic:  Reveals  loss of vision in the right eyes lower half grossly.  No other deficits  are noted.  No palpable adenopathy in the neck.  Chest:  Clear to  auscultation.  Cardiovascular:  Regular rhythm.  No murmurs.  Abdomen:  Soft, nontender with no masses.  Extremities:  Reveal 3+ femoral,  popliteal and dorsalis pedis pulses bilaterally.   Carotid duplex exam reveals moderate right internal carotid stenosis  approximating 70% with some mild left internal carotid stenosis  approximating 40-50%.   IMPRESSION:  1. Recent right retinal embolus secondary to right carotid occlusive      disease.  2. Hypertension.  3. Hyperlipidemia.  4. Asthma.   PLAN:  Is to admit the patient for right carotid endarterectomy on June  16.  The risks and benefits have been thoroughly discussed.  If the  patient has any further symptoms in the contralateral side involving  speech or the right upper or lower extremity she will be in touch with  [place] for possible left brain TIAs although her disease on the left is  fairly mild.   [name], M.D.  Electronically Signed   JDL/MEDQ  D:  01/04/2009  T:  01/05/2009  Job:  [id number]   cc:   [name], M.D.  Angel Cloud. Kriste Basque, MD

## 2010-12-12 NOTE — Procedures (Signed)
CAROTID DUPLEX EXAM   INDICATION:  Right arterial occlusion with partial vision loss since  Friday.   HISTORY:  Diabetes:  No.  Cardiac:  No.  Hypertension:  Yes.  Smoking:  No.  Previous Surgery:  No.  CV History:  Right eye partial vision loss, possible TIA Friday with  limited right arm control, yesterday the patient states had difficulty  speaking.  Amaurosis Fugax No, Paresthesias No, Hemiparesis No.                                       RIGHT             LEFT  Brachial systolic pressure:         158               160  Brachial Doppler waveforms:         WNL               WNL  Vertebral direction of flow:        Antegrade         Antegrade  DUPLEX VELOCITIES (cm/sec)  CCA peak systolic                   88                104  ECA peak systolic                   104               102  ICA peak systolic                   233               132  ICA end diastolic                   67                39  PLAQUE MORPHOLOGY:                  Calcified         Mixed  PLAQUE AMOUNT:                      Moderate          Mild/moderate  PLAQUE LOCATION:                    ICA/ECA           ICA   IMPRESSION:  1. Right ICA shows evidence of 60% to 79% stenosis (low end of range).  2. Left ICA shows evidence of 40% to 59% stenosis (low end of range).  3. Dr. Madilyn Fireman was informed of the results and follow-up appointment      scheduled to see Dr. Hart Rochester Tuesday.   ___________________________________________  Quita Skye. Hart Rochester, M.D.   AS/MEDQ  D:  12/30/2008  T:  12/30/2008  Job:  [id number]

## 2010-12-12 NOTE — Procedures (Signed)
CAROTID DUPLEX EXAM   INDICATION:  Followup of carotid artery disease.   HISTORY:  Diabetes:  No.  Cardiac:  No.  Hypertension:  Yes.  Smoking:  No.  Previous Surgery:  Right CEA with DPA 01/12/2009.  CV History:  Asymptomatic.  Amaurosis Fugax No, Paresthesias No, Hemiparesis No                                       RIGHT             LEFT  Brachial systolic pressure:         172               170  Brachial Doppler waveforms:         WNL               WNL  Vertebral direction of flow:        Antegrade         Antegrade  DUPLEX VELOCITIES (cm/sec)  CCA peak systolic                   82                81  ECA peak systolic                   103               79  ICA peak systolic                   97                125  ICA end diastolic                   34                28  PLAQUE MORPHOLOGY:                                    Mixed  PLAQUE AMOUNT:                      None              None/moderate  PLAQUE LOCATION:                                      ICA   IMPRESSION:  1. Right internal carotid artery shows no evidence of stenosis status      post carotid endarterectomy.  2. Left internal carotid artery shows evidence of 40%-59% stenosis      (low end of range).        ___________________________________________  Quita Skye Hart Rochester, M.D.   AS/MEDQ  D:  08/05/2009  T:  08/05/2009  Job:  [id number]

## 2010-12-12 NOTE — Op Note (Signed)
NAME:  [name], [name]                ACCOUNT NO.:  [id number]   MEDICAL RECORD NO.:  [id number]          PATIENT TYPE:  INP   LOCATION:  3309                         FACILITY:  [facility]   PHYSICIAN:  Quita Skye. Hart Rochester, M.D.  DATE OF BIRTH:  [birth year]   DATE OF PROCEDURE:  01/12/2009  DATE OF DISCHARGE:                               OPERATIVE REPORT   PREOPERATIVE DIAGNOSIS:  Right retinal embolus with right carotid  occlusive disease.   POSTOPERATIVE DIAGNOSIS:  Right retinal embolus with right carotid  occlusive disease.   OPERATIONS:  Right carotid endarterectomy with Dacron patch angioplasty.   SURGEON:  Quita Skye. Hart Rochester, MD   FIRST ASSISTANT:  Jerold Coombe, PA   ANESTHESIA:  General endotracheal.   BRIEF HISTORY:  This patient suffered a partial blindness in the right  eye on Dec 24, 2008, without previous warning.  She had no other  neurologic deficits, but was found to have a retinal embolus by  ophthalmologic evaluation with Dr. Alan Mulder.  She did state that  she had had an episode where her right hand had become mildly clumsy and  another episode of some mild speech irregularity lasting a very short  time.  She was found to have bilateral carotid occlusive disease with  the right side more severe than the left and the left being relatively  mild.  She is scheduled for right carotid endarterectomy to prevent  further neurologic deficit.   PROCEDURE:  The patient was taken to the operating room and placed in  the supine position at which time a satisfactory general endotracheal  anesthesia was administered.  Right neck was prepped with Betadine scrub  and solution and draped in a routine sterile manner.  An incision was  made along the anterior border of the sternocleidomastoid muscle and  carried down through the subcutaneous tissue and platysma using Bovie.  The common facial vein and external jugular veins were ligated with 3-0  silk ties and divided exposing  the common, internal, and external  carotid arteries.  Care was taken not to injure the vagus or hypoglossal  nerves both of which were exposed.  There was calcified atherosclerotic  plaque at the carotid bifurcation extending up the internal carotid  about 4-cm plaque and distal vessel appeared normal.  A #10 shunt was  prepared and the patient was heparinized.  Carotid vessels were occluded  with vascular clamps.  A longitudinal was opening made in the common  carotid with a 15 blade and extended up the internal carotid with Potts  scissors to a point distal to the disease.  The plaque was not severely  stenotic in nature, but was very irregular and ulcerated on the  posterior wall.  Distal vessel was normal.  A #10 shunt was inserted  without difficulty reestablishing flow in about 2 minutes.  Standard  endarterectomy was then performed using the elevator and Potts scissors  with an eversion endarterectomy of the external carotid.  The plaque  feathered off distal internal carotid artery nicely, not requiring any  tacking sutures.  Lumen was thoroughly irrigated with  heparin saline.  All loose debris was carefully removed and arteriotomy was closed with a  patch using continuous 6-0 Prolene.  Prior to completion of the closure,  the shunt was removed after about 30 minutes of shunt time.  Following  antegrade and retrograde flushing, closure was completed reestablishing  flow initially up the external and up the internal branch.  Carotid was  occluded for less than 2 minutes for removal shunt.  Protamine was  then given to reverse the heparin.  Following adequate hemostasis,  wounds were irrigated with saline and closed in layers with Vicryl in a  subcuticular fashion.  Sterile dressing was applied.  The patient was  taken to the recovery room in satisfactory condition.      Quita Skye Hart Rochester, M.D.  Electronically Signed     JDL/MEDQ  D:  01/12/2009  T:  01/13/2009  Job:  [id number]

## 2010-12-12 NOTE — Assessment & Plan Note (Signed)
OFFICE VISIT   Grissom, Ahniya A  DOB:  Dec 19, 1934                                       02/01/2009  [id number]#:[id number]   The patient returns status post right carotid endarterectomy on June 16  following a retinal embolus on the right side with partial visual loss.  This occurred May 28.  She had had no previous right brain symptoms.  She did have one episode of transient weakness in her right hand and a  second episode of some mild speech irregularity both lasting only a few  minutes or less and around the same time period.  Her carotid studies  revealed moderately severe right internal carotid stenosis and only mild  to moderate left internal carotid disease.  She has had no further left  brain symptoms.  She has done well since the surgery with no new  neurologic complications and she is taking one aspirin per day.  She is  swallowing well and has no hoarseness.   PHYSICAL EXAM:  Blood pressure 172/79, heart rate 90, respirations 18.  Right neck incision has healed nicely.  Carotid pulses are 3+ with no  audible bruits.  Neurologic exam is normal.  She does have a very mild  right marginal mandibular nerve paresis which is improving.   In general I think she is doing well.  We discussed potentially  proceeding with left carotid endarterectomy versus following this.  Since the disease is quite mild and she has only had one small set of  symptoms on two separate occasions she would like to follow this for  now.  I will see her in 6 months with followup carotid duplex exam  unless she develops any left brain symptoms in the interim at which  point she will be in touch with me.  We will see her in 6 months.   Quita Skye Hart Rochester, M.D.  Electronically Signed   JDL/MEDQ  D:  02/01/2009  T:  02/02/2009  Job:  [id number]

## 2010-12-12 NOTE — Procedures (Signed)
CAROTID DUPLEX EXAM   INDICATION:  Follow up known carotid disease.   HISTORY:  Diabetes:  No.  Cardiac:  No.  Hypertension:  Yes.  Smoking:  No.  Previous Surgery:  Right carotid endarterectomy with DPA on 01/02/2009.  CV History:  Asymptomatic.  Amaurosis Fugax No, Paresthesias No, Hemiparesis No.                                       RIGHT             LEFT  Brachial systolic pressure:         170               170  Brachial Doppler waveforms:         Normal            Normal  Vertebral direction of flow:        Antegrade         Antegrade  DUPLEX VELOCITIES (cm/sec)  CCA peak systolic                   80                72  ECA peak systolic                   105               75  ICA peak systolic                   112               137  ICA end diastolic                   21                44  PLAQUE MORPHOLOGY:                                    Mixed  PLAQUE AMOUNT:                      None              Mild  PLAQUE LOCATION:                    None              ICA   IMPRESSION:  1. Patent right internal carotid artery post carotid endarterectomy      with no evidence of restenosis.  2. Doppler velocities suggest low-end 40% to 59% stenosis in the left      internal carotid artery.  3. Bilateral vertebral arteries suggest antegrade flow.  4. Stable from previous exams.   ___________________________________________  Quita Skye Hart Rochester, M.D.   NT/MEDQ  D:  02/07/2010  T:  02/07/2010  Job:  [id number]

## 2010-12-15 NOTE — H&P (Signed)
NAMEERRICKA, [name]                            ACCOUNT NO.:  [id number]   MEDICAL RECORD NO.:  [id number]                   PATIENT TYPE:  INP   LOCATION:  1824                                 FACILITY:  [facility]   PHYSICIAN:  [place] Bing, M.D.               DATE OF BIRTH:  [birth year]   DATE OF ADMISSION:  03/11/2003  DATE OF DISCHARGE:                                HISTORY & PHYSICAL   REFERRING PHYSICIAN:  Lonzo Cloud. Kriste Basque, M.D.   PRIMARY CARDIOLOGIST:  Willa Rough, M.D.   HISTORY OF PRESENT ILLNESS:  A 76 year old woman with no known cardiac  disease presenting with episodic left arm pain.  [name] was evaluated by  Dr. Myrtis Ser approximately 10 years ago for presyncope and palpitations.  A  Cardiolite and presumably echocardiogram were negative at that time.  She  has no history of chest discomfort nor dyspnea.  This morning at 11 a.m.  while at rest, she developed left upper arm and shoulder discomfort that was  moderately severe.  There were no associated symptoms.  There was no  relationship to movement of the arm.  The discomfort decreased after  approximately 5 minutes.  A few hours later, she had recurrent pain  radiating down the dorsal aspect of her arm to the hand where there were  some paresthesias.  She described some clumsiness of the hand and difficulty  holding a bag while these symptoms were occurring.  She describes the arm as  being very heavy.  Again, her symptoms resolved spontaneously after a few  minutes.  She consulted a local physician, Dr. Jeannetta Nap, who found nothing  abnormal on exam except for a blood pressure of 190/110, which is decidedly  abnormal for her.  He called our office and was advised to send the patient  to the emergency department.   PAST MEDICAL HISTORY:  1. Mild asthma.  2. DJD of the spine.  3. DJD of the knees with recommendation for right total knee replacement in     the past.  4. GERD symptoms with the diagnosis of hiatal  hernia.  5. Surgery for diverticular disease in the past.   She does not have hypertension nor diabetes.  Cholesterol status is unknown.   ALLERGIES:  Described to PENICILLIN, RELAFEN, and ORVELL.   CURRENT MEDICATIONS:  Premarin, Celebrex, Zantac, and albuterol metered dose  inhaler.   SOCIAL HISTORY:  Married and lives in [place]; three children; no history  of excessive alcohol use.   FAMILY HISTORY:  Mother had CHF; father lives into his late 7s with COPD.   REVIEW OF SYSTEMS:  The patient continues to have occasional hot flashes;  she describes an episode of diplopia a few weeks ago.  She has had chronic  headaches since an accident in 1989.  There is a history of depression.  She  has arthralgias, particularly in the right knee.  All other systems  negative.   PHYSICAL EXAMINATION:  GENERAL:  Pleasant woman in no acute distress.  VITAL SIGNS:  Blood pressure 175/75, heart rate 64 and regular, respirations  16.  HEENT:  Anicteric sclerae.  NECK:  No jugular venous distention; no carotid bruits.  ENDOCRINE:  No thyromegaly.  SKIN:  No significant lesions.  LUNGS:  Clear.  CARDIAC:  Normal first and second heart sounds; fourth heart sound present.  ABDOMEN:  Soft and nontender; no organomegaly.  EXTREMITIES:  Normal distal pulses; no edema.  NEUROMUSCULAR:  Symmetric strength and tone; no sensory abnormalities.   LABORATORY DATA:  EKG: Normal sinus rhythm; within normal limits.   Chest x-ray:  No active disease.   Initial cardiac markers negative.  Chemistry profile normal.   IMPRESSION:  [name] presents with symptoms that are not classed for  myocardial ischemia, orthopedic problems, neurologic problems, or  circulatory impairment in her upper extremity.  Of these diagnostic  considerations, her symptoms most closely resemble a transient ischemic  attack.  She may be describing dysesthesias as pain.  Myocardial ischemia is  unlikely, but certainly not  impossible.  We will treat her with  anticoagulation for both of these possibilities overnight.  Serial cardiac  markers and EKGs will be obtained.  A pharmacologic stress test will be  planned as well as neurologic evaluation in the morning.                                                Maries Bing, M.D.    RR/MEDQ  D:  03/11/2003  T:  03/12/2003  Job:  [id number]

## 2010-12-15 NOTE — Consult Note (Signed)
NAME:  GAYLYNN, SEIPLE NO.:  [id number]   MEDICAL RECORD NO.:  [id number]                   PATIENT TYPE:  INP   LOCATION:  6531                                 FACILITY:  [facility]   PHYSICIAN:  Melvyn Novas, M.D.               DATE OF BIRTH:  Sep 19, 1934   DATE OF CONSULTATION:  DATE OF DISCHARGE:  03/12/2003                                   CONSULTATION   HISTORY:  This pleasant 76 year old Caucasian female states that yesterday she  drove with her husband to [facility] where he is seen in an [place] hospital and  receives medication from.  On her way back, she developed suddenly left arm  pain below the shoulder, a severe ache, but no chest tightness, shortness of  breath or pain in the neck or head region.  The spell occurred twice.  The  second stayed for 30 minutes.  The first one was perhaps 8-10 minutes long,  she estimates.  She finally noticed during the second spell that she was  unable to grip or grasp a light bag that contained the just-picked-up  medications.  Her blood pressure was measured and showed 172/79, which was,  for her, a highly-unusual finding.  She states that she never had trouble  with high blood pressure.  Her husband drove her to Dr. Jeannetta Nap' office in  [facility], and he arranged for her ER visit and admission to [facility].  The ER vital signs read 190/110 blood pressure.  At the time  she arrived here, she had no further left arm pain or clumsiness.  No IV  heparin was used, no nitroglycerin.  A CT showed no abnormalities in form of  any acute brain changes, and a cardio stress test was scheduled for today  which, reportedly, looked normal.  Cardiac enzymes on EKG showed no  abnormality.   PAST MEDICAL HISTORY:  1. The patient suffers from colon irritation.  2. Diverticulitis.  3. Asthma.  4. Kidney cysts.  5. Knee arthritis.   REVIEW OF SYSTEMS:  CARDIOVASCULAR/ENDOCRINE:  She denies hypertension,  hypercholesterolemia or hyperglycemia.   MEDICATIONS:  1. Premarin 1.25 mg once a day, which was not given here in the hospital due     to its potentially increasing the stroke risk.  2. She took no aspirin or multivitamins at home but Celebrex 100 mg twice a     day.  3. Zantac 1 a day.   SOCIAL HISTORY:  No alcohol, drugs or smoking.  She is retired from  __________ at age 76 after 30 years.  She is married, has 3 healthy children  that are all obese but have no other medical problems, and 5 grandchildren  that she describes as in good health.   PHYSICAL EXAMINATION:  GENERAL:  Her general physical is remarkable only for  the transient hypertension that did not repeat itself  when she is admitted  to the floor.  VITAL SIGNS:  Here, 120/60.  The diastolic blood pressure was sometimes as  low as 45.  She is normal temperature at 97.2.  Her respiratory rate is 14,  and her heart rate is in the low 60s and regular.  LUNGS:  Clear to auscultation.  ABDOMEN:  Soft, nontender.  EXTREMITIES:  No peripheral edema, clubbing or cyanosis.  HEENT:  Mucous membranes well perfused.  Anicteric sclerae.  The right eye  shows a mild cloudiness of the vitreum, but the patient denies having a  history of cataract.  She states that she knows she has been diagnosed with  a beginning glaucoma.  NEUROLOGIC:  Mental status:  Alert and oriented x3.  Repetition is intact,  naming fluent, and she shows no deficits for memory or attention span.  Cranial nerves show full visual fields with bilateral simultaneous  stimulation.  Also, she states that her right vision is cloudy.  She can see  lights and shades to full visual field.  She has no papilledema.  Pupils  react equally to light.  No facial numbness is seen, and the facial symmetry  is preserved.  Tongue and uvula are midline.  Motor exam shows equal  strength, tone and mass, equal grip.  There is no tremor, ataxia or  dysmetria on finger-to-nose, and  her sensory is intact to all primary  modalities and coordination.   GENERAL CONCLUSION:  The general conclusion is that the patient suffered  less likely a TIA than a peripheral nerve injury perhaps from cervical spine  arthritis as a known underlying condition in this patient.  She has no  cranial nerve or leg involvement, which makes the focal left arm finding  much more likely to be of peripheral origin and especially since it included  motor and sensory findings and was painful.   RECOMMENDATIONS:  1. I recommend an MRI of the neck spine to evaluate the spinal cords.  2. A brain MRI with and without gadolinium to evaluate if this patient has     small vessel disease at all.  3. I would then recommend an aspirin a day.  4. She should continue her Zantac.  5. I am aware that the Celebrex and aspirin together can multiply her risk     for sigmoidal or duodenal bleeds, but I feel that this risk of having any     small vessel disease might be outweighing the risk of a gastric ulcer.  6. She should follow up with a neuromuscular specialist and either Dr.     Anne Hahn or Dr. Thad Ranger at [facility], telephone number     [phone], in 4-5 weeks, and bring her MRI for the followup visit.                                               Melvyn Novas, M.D.    CD/MEDQ  D:  03/12/2003  T:  03/13/2003  Job:  [id number]

## 2010-12-15 NOTE — Discharge Summary (Signed)
[name], [name]                            ACCOUNT NO.:  [id number]   MEDICAL RECORD NO.:  [id number]                   PATIENT TYPE:  INP   LOCATION:  6531                                 FACILITY:  [facility]   PHYSICIAN:  [place] Bing, M.D.               DATE OF BIRTH:  11-Apr-1935   DATE OF ADMISSION:  03/11/2003  DATE OF DISCHARGE:  03/12/2003                                 DISCHARGE SUMMARY   DISCHARGE DIAGNOSES:  1. Left arm discomfort.     a. Cardiac enzymes negative for myocardial infarction.     b. Adenosine-Cardiolite negative for ischemia, ejection fraction 71%.     c. Neurology consult this admission - to rule out possible transient        ischemic attack.  2. History of asthma.  3. History of hiatal hernia.  4. History of diverticular disease.  5. Osteoarthritis.   HOSPITAL COURSE:  Please see the dictated admission history and physical by  Dr. Spickard Bing for complete details.  Briefly this 76 year old female  presented to the [facility] Emergency Room with left arm pain.  She had had  two episodes of left arm pain on the date of admission March 11, 2003.  She  was admitted and treated with Lovenox and aspirin.  Her blood pressure was  elevated at 177/76 upon admission and she was started on Altace 5 mg daily.  She ruled out for myocardial infarction by serial enzymes.  Her  electrocardiogram was negative for ischemia.  She went for Adenosine-  Cardiolite on March 12, 2003.  This was negative for ischemia with an EF of  71%.  Neurology was asked to see the patient to rule out possible TIA with  her left arm symptoms.  The impression from neurology was that TIA was  possible but unusual since she had no cranial nerve or leg involvement and  the infected left arm was painful.  They question peripheral nerve injury or  transient impingement.  Neck/spinal cord MRI and brain MRI with and without  gadolinium were recommended as well as to continue Zantac and add  a baby  aspirin a day.  They recommended followup with Dr. Anne Hahn or Dr. Thad Ranger  (neuromuscular specialists) after the MRIs are complete.  She will need to  bring her films with her.  The patient needs no further cardiac workup and  can follow up with Dr. Kriste Basque.  She can see Dr. Myrtis Ser, who she has seen in the  past, in the future as necessary.  She will need a BMET drawn in 2 weeks  with the initiation of her Altace and we will try to arrange that through  Dr. Jodelle Green office.  At discharge her potassium is 3.2 and we will replace  that prior to discharge.   LABORATORIES:  White count 6100, hemoglobin 11.9, hematocrit 34, platelet  count 169,000, INR 1.1, sodium 139, potassium  3.2, chloride 107, CO2 26,  glucose 86, BUN 13, creatinine 0.8, total bilirubin 0.6, alkaline  phosphatase 52, AST 15, ALT 11, total protein 6.5, albumin 3.2, calcium 8.5,  cardiac enzymes negative x3, total cholesterol 203, triglycerides 176, HDL  69, LDL 99.  Chest x-ray no acute cardiopulmonary process.   DISCHARGE MEDICATIONS:  1. Altace 5 mg daily.  2. Aspirin 81 mg daily.  3. Multivitamin daily.  4. Premarin 1.25 mg daily.  5. Celebrex 100 mg daily.  6. Zantac.  7. Inhalers as directed.   ACTIVITY:  No restrictions.   DIET:  Low fat, low sodium.   DISPOSITION AND FOLLOWUP:  The patient will be scheduled for a brain MRI  with and without gadolinium and a neck/spinal cord MRI through [facility] and she will be provided with the date and time on that prior to  discharge.  She has been asked to call Dr. Jodelle Green office and arrange a  followup appointment.  We will try to have our office contact Dr. Jodelle Green  office to set that up.  The patient will need a BMET drawn in 2 weeks to  follow up on her initiation of Altace.  She will need carotid duplex  ultrasound performed and we will set this up through our office and she will  be contacted with an appointment.  After her MRI is complete she will  need  to contact [facility] to set up an appointment with  Dr. Anne Hahn or Dr. Thad Ranger (neuromuscular specialists).  She has been  provided with phone number.      Tereso Newcomer, P.A.                        [place] Bing, M.D.    SW/MEDQ  D:  03/12/2003  T:  03/12/2003  Job:  [id number]   cc:   Lonzo Cloud. Kriste Basque, M.D. [facility]   [facility]  Attn: Dr. Anne Hahn and Dr. Vernie Shanks [facility]

## 2010-12-15 NOTE — H&P (Signed)
NAME:  [name], [name]                ACCOUNT NO.:  [id number]   MEDICAL RECORD NO.:  [id number]         PATIENT TYPE:  LINP   LOCATION:  1512                         FACILITY:  [facility]   PHYSICIAN:  Ollen Gross, M.D.    DATE OF BIRTH:  1934-10-23   DATE OF ADMISSION:  08/12/2006  DATE OF DISCHARGE:                              HISTORY & PHYSICAL   CHIEF COMPLAINT:  Right knee pain.   HISTORY OF PRESENT ILLNESS:  This is a 76 year old female seen by Dr.  Lequita Halt for ongoing right knee pain that has been ongoing right knee  pain that has been going on for several years now.  She is seen in the  office where x-rays show that she has end-stage valgus arthritis with  some lateral subluxation of the tibia and patella femoral arthritis.  It  is felt she could benefit medical knee replacement due to her end-stage  arthritis.  Risks and benefits have been discussed.  The patient is  subsequently admitted to the hospital.   ALLERGIES:  PENICILLIN causes hives.  ORUVAIL causes bleeding.  RELAFEN.   CURRENT MEDICATIONS:  Diovan, levothyroxine, Celebrex, omeprazole,  vitamin B12, B-caps, hydrocodone, Premarin, cranberry pills and aspirin.   PAST MEDICAL HISTORY:  1. Asthma.  2. Bronchitis.  3. History of pneumonia requiring hospitalization twice.  4. Hypertension.  5. Hiatal hernia.  6. History of diverticulosis.  7. Esophageal strictures.  8. History of UTIs.  9. History of cystitis.  10.Hypothyroidism.  11.History of anemia.  12.Postmenopausal.  13.Fibrocystic breast disease.   PAST SURGICAL HISTORY:  1. Tubal ligation.  2. Left breast cyst aspiration.  3. Partial colectomy secondary to diverticulosis.  4. Hysterectomy.  5. Right knee arthroscopy.  6. Nerve clipping of the right foot which sounds like a Morton's      neuroma surgery.  7. EGD with esophageal dilatation.   SOCIAL HISTORY:  Married, nonsmoker, no alcohol with three children.  Husband will be assisting with the  care after surgery.   FAMILY HISTORY:  Father deceased at age 49 with history of emphysema.  Mother deceased at age 63 with adult-onset diabetes, heart disease and  arthritis.   REVIEW OF SYSTEMS:  General:  No fevers, chills or night sweats.  Neurologic:  No seizures or paralysis.  Respiratory:  There is a little  bit of shortness of breath on exertion, none at rest.  No productive  cough or hemoptysis.  Cardiovascular:  No chest pain, angina or  orthopnea.  GI:  No nausea, vomiting, diarrhea or constipation.  GU:  No  dysuria, hematuria or discharge.  Musculoskeletal:  Right knee.   PHYSICAL EXAMINATION:  VITAL SIGNS:  Pulse 88, respirations 12, blood  pressure 174/80.  GENERAL:  A 76 year old, white female well-developed, well-nourished in  no acute distress.  She is alert, oriented and cooperative, very  pleasant, average historian.  HEENT:  Normocephalic, atraumatic, pupils equal round and reactive to  light.  Oropharynx clear.  TMs intact.  Full upper and lower denture  plates.  NECK:  Supple, no bruits.  CHEST:  Clear except with some end-inspiratory  short wheezes.  This does  clear after coughing though.  HEART:  Regular rate and rhythm with no murmur.  ABDOMEN:  Soft, nontender, bowel sounds present.  BREASTS/GENITALIA/RECTAL:  Not done and not pertinent to present  illness.  EXTREMITIES:  10-degree valgus deformity malalignment.  Range of motion  0-125 in the lateral and medial.   IMPRESSION:  1. Osteoarthritis of right knee.  2. History of asthma.  3. History of bronchitis.  4. History of pneumonia.  5. Hypertension.  6. Hiatal hernia.  7. Reflux disease.  8. History of esophageal strictures, status post dilatation.  9. History of diverticulosis, status post partial colectomy.  10.History of urinary tract infections.  11.History of cystitis.  12.Hypothyroidism.  13.History of anemia.  14.Postmenopausal.  15.History of fibrocystic breast disease.   PLAN:  The  patient is admitted to [facility] to undergo a  right total knee replacement arthroplasty.  Surgery will be performed by  Dr. Lequita Halt.      Alexzandrew L. Julien Girt, P.A.      Ollen Gross, M.D.  Electronically Signed    ALP/MEDQ  D:  08/11/2006  T:  08/12/2006  Job:  [id number]   cc:   Lonzo Cloud. Kriste Basque, MD  [address]  [place]  [place] [address]   Ollen Gross, M.D.  Fax: [phone]

## 2010-12-15 NOTE — Discharge Summary (Signed)
[facility]  Patient:    [name], [name] Visit Number: [id number] MRN: [id number]          Service Type: SUR Location: 3W 0355 01 Attending Physician:  Delsa Bern Dictated by:   Lorne Skeens. Hoxworth, M.D. Admit Date:  01/23/2002 Discharge Date: 01/28/2002   CC:         Genene Churn. Sherin Quarry, M.D.  Lonzo Cloud. Kriste Basque, M.D.   Discharge Summary  DISCHARGE DIAGNOSIS:  Diverticulosis.  OPERATIONS AND PROCEDURES:  Sigmoid colectomy on 01/23/02.  HISTORY OF PRESENT ILLNESS:  The patient is a 76 year old white female with a long history of recurrent bouts of diverticulitis that had been treated and followed by Dr. Sherin Quarry.  This has been episodic over the last 10 or 15 years with worsening this past year.  She has had a CT scan revealing diffuse pericolonic inflammatory change involving the sigmoid colon during one of these episodes.  A colonoscopy recently performed showed marked tortuosity and a possible stricture of the sigmoid colon.  Due to worsening and ongoing symptoms, we have elected to proceed with elective sigmoid colectomy. Following mechanical antibiotic bowel prep the patient is admitted for the procedure.  PAST MEDICAL HISTORY: 1. Hysterectomy through a Pfannenstiel incision. 2. Tubal ligation. 3. Herniated disk treated non-surgically. 4. She is followed for asthma by Dr. Kriste Basque.  MEDICATIONS: 1. Premarin 1.25 mg q.d. 2. Zantac one q.d. for mild reflux. 3. Atrovent and Ventolin inhalers p.r.n.  ALLERGIES:  PENICILLIN, RELAFEN.  Family history, social history, and review of systems, see detailed H&P.  PHYSICAL EXAMINATION:  GENERAL:  She is a well-developed white female in no acute distress.  Mildly overweight.  ABDOMEN:  Unremarkable to exam.  No other significant findings.  HOSPITAL COURSE:  The patient was admitted on the morning of her procedure, and underwent an uneventful sigmoid colectomy.  Her postoperative  recovery was smooth.  Foley catheter was discontinued on the first postoperative day.  She was started on a clear liquid diet on the third postoperative day, which she tolerated well, was able to be advanced rapidly to a regular diet.  By the fifth postoperative day, her bowels were moving, abdomen was soft and nontender, wound healing primarily.  She was discharged home at this time.  DISCHARGE MEDICATIONS: 1. Same as admission. 2. Tylox for pain.  Final pathology revealed extensive diverticulosis and some scarring.  FOLLOWUP:  With me in my office in 7 to 10 days. Dictated by:   Lorne Skeens. Hoxworth, M.D. Attending Physician:  Delsa Bern DD:  02/11/02 TD:  02/15/02 Job: [id number]

## 2010-12-15 NOTE — Op Note (Signed)
NAME:  CEIRA, HOESCHEN                ACCOUNT NO.:  [id number]   MEDICAL RECORD NO.:  [id number]          PATIENT TYPE:  INP   LOCATION:  X009                         [facility]ACILITY:  [facility]   PHYSICIAN:  Ollen Gross, M.D.    DATE OF BIRTH:  01-23-1935   DATE OF PROCEDURE:  08/12/2006  DATE OF DISCHARGE:                               OPERATIVE REPORT   PREOPERATIVE DIAGNOSIS:  Osteoarthritis, right knee.   POSTOPERATIVE DIAGNOSIS:  Osteoarthritis, right knee.   PROCEDURE:  Right total knee arthroplasty.   SURGEON:  Dr. Lequita Halt   ASSISTANT:  [name], PA-C   ANESTHESIA:  General with postop Marcaine pain pump.   ESTIMATED BLOOD LOSS:  Minimal.   DRAIN:  Hemovac x1.   TOURNIQUET TIME:  40 minutes at 300 mmHg   COMPLICATIONS:  None.   CONDITION:  Stable to recovery.   BRIEF CLINICAL NOTE:  Ms. Kaman is a 76 year old female with end-stage  osteoarthritis of the right knee with intractable pain.  She presents  for total knee arthroplasty.   PROCEDURE IN DETAIL:  After the successful administration of general  anesthetic, a tourniquet is placed high on the right thigh and right  lower extremity is prepped and draped in the usual sterile fashion.  Extremity is wrapped in Esmarch, knee flexed, tourniquet inflated to 300  mmHg.  Midline incision is made with a 10 blade through subcutaneous  tissue to the level of the extensor mechanism.  Given her significant  valgus deformity, we did a lateral parapatellar arthrotomy.  Soft tissue  over the proximal and lateral tibia is subperiosteally elevated to the  joint line with a knife.  We then everted the patella medially and  flexed the knee 90 degrees.  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  Drill was used to  create a starting hole in the distal femur, and the canal is thoroughly  irrigated.  A 5-degree right valgus alignment guide is placed and  referencing off the posterior  condyles, rotation is marked and the block  pinned to remove 10 mm off the distal femur.  Distal femoral resection  is made with an oscillating saw.  A sizing block is placed, and size 3  is most appropriate.  Size 3 cutting block is placed and the anterior,  posterior, and chamfer cuts are made.   Tibia is subluxed forward, and the menisci are removed.  Extramedullary  tibial alignment guide is placed, referencing proximally at the medial  aspect of the tibial tubercle and distally along the second metatarsal  axis and tibial crest.  The block is pinned to remove approximately 4 mm  off the more deficient lateral side.  Tibial resection is made with an  oscillating saw.  Sizing block is placed.  Size 3 is the most  appropriate.  The proximal tibia is then prepared with the modular drill  and keel punch for a size 3.  Femoral preparation is completed with the  intercondylar cut.   Size 3 mobile bearing tibial trial, size 3 posterior stabilized femoral  trial with a 10 mm posterior stabilized rotating platform insert trial  are placed.  With the 10, full extension is achieved with excellent  varus and valgus balance throughout full range of motion.  The patella  was again everted and thickness measured to be 22 mm.  Free-hand  resection is taken to 12 mm, 38 template placed; lug holes are drilled;  trial patellar is placed, and it tracks normally.  Osteophytes are  removed off the posterior femur with the trial in place.  All trials are  removed and the cut bone surfaces prepared with pulsatile lavage.  Cement is mixed and once ready for implantation, the size 3 mobile  bearing tibial tray, size 3 posterior stabilized femur, and 35 patella  are cemented into place, and patella is held with a clamp.  Trial 10 mm  insert is placed, knee held in full extension, and all extruded cement  removed.  Once the cement is fully hardened, then the permanent 10 mm  posterior stabilized rotating  platform insert is placed into the tibial  tray.  The wound is copiously irrigated with saline solution and the  tourniquet released for a total time of 40 minutes.  Minor bleeding  stopped with cautery.  The arthrotomy is closed over a Hemovac drain  with interrupted #1 PDS.  A left open area from the superior to the  inferior pole of the patella to serve as a mini-release.  Subcu is then  closed with interrupted 2-0 Vicryl, subcuticular running 4-0 Monocryl.  The catheter for the Marcaine pain pump is placed, and the pump is  initiated.  Steri-Strips and a bulky sterile dressing are then applied,  and she is placed into a knee immobilizer, awakened, and transported to  recovery in stable condition.      Ollen Gross, M.D.  Electronically Signed     FA/MEDQ  D:  08/12/2006  T:  08/12/2006  Job:  [id number]

## 2010-12-15 NOTE — Op Note (Signed)
[facility]  Patient:    [name], [name] Visit Number: [id number] MRN: [id number]          Service Type: SUR Location: 3W 0355 01 Attending Physician:  Delsa Bern Dictated by:   Lorne Skeens. Hoxworth, M.D. Proc. Date: 01/23/02 Admit Date:  01/23/2002                             Operative Report  PREOPERATIVE DIAGNOSIS:  Sigmoid diverticulitis.  POSTOPERATIVE DIAGNOSIS:  Sigmoid diverticulitis.  SURGICAL PROCEDURE:  Sigmoid colectomy.  SURGEON:  Lorne Skeens. Hoxworth, M.D.  ASSISTANT:  Currie Paris, M.D.  ANESTHESIA:  General.  BRIEF HISTORY:  [name] is a 76 year old white female, with a long history of recurrent bouts of acute diverticulitis documented by CT scan. This has been localized in the sigmoid colon.  More recently, these have become more frequent, and recent sigmoidoscopy was unable to negotiate the sigmoid colon due to apparent stricturing and tortuosity.  Due to worsening chronic and acute symptoms and probable developing stricture, sigmoid colectomy has been recommended and accepted.  The nature of the procedure, its indications, alternatives, risks of bleeding, infection, anastomotic leak, and possible colostomy were discussed and understood.  She is now brought to the operating room for this procedure.  DESCRIPTION OF OPERATION:  Following mechanical and antibiotic bowel prep at home, the patient was brought to the operating room and placed in the supine position on the operating table, and general endotracheal anesthesia was induced.  The abdomen was sterilely prepped and draped.  PAS were in place. She received preoperative broad-spectrum IV antibiotics.  A low midline incision obscuring the umbilicus was used and dissection carried down to the midline fascia.  The peritoneum was entered under direct vision.  The sigmoid colon was exposed and had chronic diverticular changes, adhesions down into the  pelvis, and thickening.  The sigmoid colon was extensively mobilized, dividing lateral peritoneal attachments and attachments to the peritoneum of the bladder, and the very redundant sigmoid colon was mobilized up out of the pelvis.  Examination revealed extensive diverticular changes in the mid sigmoid colon, and proximally and distally the bowel was soft and normal which would allow resection and anastomosis under no tension.  Points of proximal and distal resection at the distal left colon and rectosigmoid near the pelvic brim were chosen.  The mesentery between these two areas was then sequentially taken and divided between clamps and tied with 2-0 silk ties.  The colon was divided at these two points between [facility] and [place] clamps.  The specimen was removed.  An end-to-end anastomosis was then created using full-thickness interrupted 2-0 silk sutures circumferentially.  Following this, all gloves and instruments were changed.  The abdomen was irrigated and complete hemostasis assured.  The mesenteric defect was closed with interrupted silks. The viscera were returned to their anatomic position.  The midline fascia was closed with running #1 PDS, beginning at either end of the incision and tied centrally.  The subcutaneous tissue was irrigated and the skin closed with staples.  Sponge, needle, and instrument counts were correct.  Dry sterile dressings were applied and the patient taken to recovery in good condition. Dictated by:   Lorne Skeens. Hoxworth, M.D. Attending Physician:  Delsa Bern DD:  01/23/02 TD:  01/24/02 Job: [id number]

## 2010-12-15 NOTE — Discharge Summary (Signed)
[name], [name]                ACCOUNT NO.:  [id number]   MEDICAL RECORD NO.:  [id number]          PATIENT TYPE:  INP   LOCATION:  1512                         FACILITY:  [facility]   PHYSICIAN:  Ollen Gross, M.D.    DATE OF BIRTH:  10-15-1934   DATE OF ADMISSION:  08/12/2006  DATE OF DISCHARGE:  08/15/2006                               DISCHARGE SUMMARY   DISCHARGE SUMMARY   ADMISSION DIAGNOSES:  1. Osteoarthritis right knee.  2. History of asthma.  3. History of bronchitis.  4. History of pneumonia.  5. Hypertension.  6. Hiatal hernia.  7. Reflux disease.  8. History of esophageal stricture status post dilatation.  9. History of diverticulosis status post partial colectomy.  10.History of urinary tract infections.  11.History of cystitis.  12.Hypothyroidism.  13.History of anemia.  14.Postmenopausal.  15.History of fibrocystic breast disease.   DISCHARGE DIAGNOSES:  1. Osteoarthritis right knee status post right total knee      arthroplasty.  2. Post-op acute blood loss anemia, did not require transfusion.  3. Pre-op mild hypokalemia, improved.  4. History of asthma.  5. History of bronchitis.  6. History of pneumonia.  7. Hypertension.  8. Hiatal hernia.  9. Reflux disease.  10.History of esophageal stricture status post dilatation.  11.History of diverticulosis status post partial colectomy.  12.History of urinary tract infections.  13.History of cystitis.  14.Hypothyroidism.  15.History of anemia.  16.Postmenopausal.  17.History of fibrocystic breast disease.   PROCEDURE:  August 12, 2006:  Right total knee.   SURGEON:  Dr. Lequita Halt.   ASSISTANTJulien [name], PAC.   ANESTHESIA:  General.   CONSULTATIONS:  None.   HISTORY:  [name] is a 76 year old female with end-stage arthritis of  the right knee with intractable pain who now presents for total knee  arthroplasty.   LABORATORY DATA:  Pre-op CBC:  Hemoglobin 12.1, hematocrit 34.5, white  cell  count 4.7.  Post-op hemoglobin 9.6, drifted down to 8.8.  Last  known hemoglobin and hematocrit 8.7 and 24.7.  PT-PTT pre-op 13.3 and 28  respectively.  INR 1.  Last known PT and INR 19.2 and 1.6.  chem panel  on admission all within normal limits with the exception of mildly low  potassium of 3.4.  __________ came back up to 4.3.  Glucose 198 to 138,  back down to 124.  Remainder electrolytes remained within normal limits.  Pre-op UA negative.  Blood group type AB positive.   EKG January 8,2008:  Normal sinus rhythm.  Normal EKG when compared.  No  change since January 19, 2002, confirmed by Dr. Charlton Haws.   Two view chest August 06, 2006:  Borderline cardiomegaly.  No active  disease.   HOSPITAL COURSE:  Patient admitted to [facility].  Tolerated  the procedure well.  Later transferred to the recovery room to  orthopedic floor.  Started on PCA __________ surgery had a pretty rough  night with pain after surgery.  He was utilizing PCA and encouraged  pills, starting up on day 1.  Hemovac drain pulled.  Has some discomfort  on the back of the knee so we loosened up the dressing a little bit.  Hemoglobin was down to 9.6 post-op.  Started on iron supplements.  Started getting up with PT.  By day 2 she was doing much better.  Still  hurting but the pain was under better control and she was in better  spirits.  Looked more comfortable.  Hemoglobin was down to 8.8 but she  was asymptomatic with this.  Will monitor for symptoms.  PC and IV fluid  were discontinued.  Dressing was changed.  The incision looked good.  She got up on day 2 and walked about 30 feet.  Later did very up to 200  feet.  Did so well she was ready to go home by the next day on August 15, 2006.   DISCHARGE PLAN:  1. Patient discharged home on August 15, 2006.  2. Discharge diagnoses:  Please see above.  3. Discharge meds:  Coumadin, Vicodin, Robaxin and Nu-Iron.  4. Diet:  Resume home diet.  5. Followup  2 weeks.  6. Activity:  Weightbearing as tolerated.  Total knee protocol.  Home      PT.  Home nursing.   DISPOSITION:  Home.  Condition on discharge improved.      [name], P.A.      Ollen Gross, M.D.  Electronically Signed    ALP/MEDQ  D:  09/27/2006  T:  09/27/2006  Job:  [id number]   cc:   Ollen Gross, M.D.  Fax: [phone]   Lonzo Cloud. Kriste Basque, MD  [address]  [place]  [place] [address]

## 2011-02-06 ENCOUNTER — Other Ambulatory Visit: Payer: Self-pay

## 2011-02-20 ENCOUNTER — Other Ambulatory Visit: Payer: Self-pay

## 2011-03-06 ENCOUNTER — Other Ambulatory Visit (INDEPENDENT_AMBULATORY_CARE_PROVIDER_SITE_OTHER): Payer: Medicare Other

## 2011-03-06 ENCOUNTER — Other Ambulatory Visit: Payer: Self-pay

## 2011-03-06 DIAGNOSIS — I6529 Occlusion and stenosis of unspecified carotid artery: Secondary | ICD-10-CM

## 2011-03-06 DIAGNOSIS — Z48812 Encounter for surgical aftercare following surgery on the circulatory system: Secondary | ICD-10-CM

## 2011-03-14 ENCOUNTER — Other Ambulatory Visit: Payer: Medicare Other

## 2011-03-19 NOTE — Procedures (Unsigned)
CAROTID DUPLEX EXAM  INDICATION:  Follow up right CEA.  HISTORY: Diabetes:  No. Cardiac:  No. Hypertension:  Yes. Smoking:  No. Previous Surgery:  Right CEA performed 01/02/2009. CV History: Amaurosis Fugax No, Paresthesias No, Hemiparesis No.                                      RIGHT             LEFT Brachial systolic pressure:         182               182 Brachial Doppler waveforms:         WNL               WNL Vertebral direction of flow:        Antegrade         Antegrade DUPLEX VELOCITIES (cm/sec) CCA peak systolic                   94                64 ECA peak systolic                   72                73 ICA peak systolic                   85                88 ICA end diastolic                   19                24 PLAQUE MORPHOLOGY:                                    Heterogenous PLAQUE AMOUNT:                      NA                Mild PLAQUE LOCATION:                                      ICA  IMPRESSION: 1. Widely patent right carotid endarterectomy without evidence of     restenosis or hyperplasia. 2. 1% to 39% left internal carotid artery plaquing. 3. Bilateral vertebral arteries are within normal limits. 4. Stable results compared to previous study 1 year ago.  ___________________________________________ Quita Skye. Hart Rochester, M.D.  LT/MEDQ  D:  03/06/2011  T:  03/06/2011  Job:  [id number]

## 2011-08-22 DIAGNOSIS — Z1231 Encounter for screening mammogram for malignant neoplasm of breast: Secondary | ICD-10-CM | POA: Diagnosis not present

## 2011-08-22 DIAGNOSIS — N951 Menopausal and female climacteric states: Secondary | ICD-10-CM | POA: Diagnosis not present

## 2011-10-04 DIAGNOSIS — M76899 Other specified enthesopathies of unspecified lower limb, excluding foot: Secondary | ICD-10-CM | POA: Diagnosis not present

## 2011-10-10 ENCOUNTER — Encounter: Payer: Self-pay | Admitting: Gastroenterology

## 2011-10-10 ENCOUNTER — Encounter: Payer: Self-pay | Admitting: Pulmonary Disease

## 2011-10-10 ENCOUNTER — Other Ambulatory Visit (INDEPENDENT_AMBULATORY_CARE_PROVIDER_SITE_OTHER): Payer: Medicare Other

## 2011-10-10 ENCOUNTER — Ambulatory Visit (INDEPENDENT_AMBULATORY_CARE_PROVIDER_SITE_OTHER): Payer: Medicare Other | Admitting: Pulmonary Disease

## 2011-10-10 VITALS — BP 150/84 | HR 64 | Temp 97.0°F | Ht 65.0 in | Wt 196.2 lb

## 2011-10-10 DIAGNOSIS — E039 Hypothyroidism, unspecified: Secondary | ICD-10-CM

## 2011-10-10 DIAGNOSIS — M545 Low back pain: Secondary | ICD-10-CM

## 2011-10-10 DIAGNOSIS — I679 Cerebrovascular disease, unspecified: Secondary | ICD-10-CM | POA: Diagnosis not present

## 2011-10-10 DIAGNOSIS — K219 Gastro-esophageal reflux disease without esophagitis: Secondary | ICD-10-CM

## 2011-10-10 DIAGNOSIS — M1990 Unspecified osteoarthritis, unspecified site: Secondary | ICD-10-CM

## 2011-10-10 DIAGNOSIS — I10 Essential (primary) hypertension: Secondary | ICD-10-CM

## 2011-10-10 DIAGNOSIS — J45909 Unspecified asthma, uncomplicated: Secondary | ICD-10-CM

## 2011-10-10 DIAGNOSIS — E559 Vitamin D deficiency, unspecified: Secondary | ICD-10-CM | POA: Diagnosis not present

## 2011-10-10 DIAGNOSIS — K5730 Diverticulosis of large intestine without perforation or abscess without bleeding: Secondary | ICD-10-CM

## 2011-10-10 DIAGNOSIS — F411 Generalized anxiety disorder: Secondary | ICD-10-CM

## 2011-10-10 LAB — CBC WITH DIFFERENTIAL/PLATELET
Basophils Relative: 0.9 % (ref 0.0–3.0)
Eosinophils Relative: 0.9 % (ref 0.0–5.0)
HCT: 36.1 % (ref 36.0–46.0)
MCV: 97.2 fl (ref 78.0–100.0)
Monocytes Absolute: 0.4 10*3/uL (ref 0.1–1.0)
Monocytes Relative: 6.2 % (ref 3.0–12.0)
Neutrophils Relative %: 68.3 % (ref 43.0–77.0)
RBC: 3.71 Mil/uL — ABNORMAL LOW (ref 3.87–5.11)
WBC: 6.8 10*3/uL (ref 4.5–10.5)

## 2011-10-10 LAB — LDL CHOLESTEROL, DIRECT: Direct LDL: 103.1 mg/dL

## 2011-10-10 LAB — BASIC METABOLIC PANEL
BUN: 18 mg/dL (ref 6–23)
Creatinine, Ser: 0.9 mg/dL (ref 0.4–1.2)
GFR: 64.19 mL/min (ref >60.00)

## 2011-10-10 LAB — LIPID PANEL
Cholesterol: 214 mg/dL — ABNORMAL HIGH (ref 0–200)
HDL: 90.8 mg/dL (ref >39.00)
VLDL: 21.2 mg/dL (ref 0.0–40.0)

## 2011-10-10 LAB — HEPATIC FUNCTION PANEL
ALT: 10 U/L (ref 0–35)
Total Bilirubin: 0.3 mg/dL (ref 0.3–1.2)

## 2011-10-10 MED ORDER — LEVOTHYROXINE SODIUM 100 MCG PO TABS: 100.0000 ug | ORAL_TABLET | Freq: Every day | ORAL | Status: DC

## 2011-10-10 MED ORDER — ALBUTEROL SULFATE HFA 108 (90 BASE) MCG/ACT IN AERS: 2.0000 | INHALATION_SPRAY | RESPIRATORY_TRACT | Status: DC | PRN

## 2011-10-10 MED ORDER — LOSARTAN POTASSIUM 50 MG PO TABS: 50.0000 mg | ORAL_TABLET | Freq: Every day | ORAL | Status: DC

## 2011-10-10 MED ORDER — LOSARTAN POTASSIUM 100 MG PO TABS: 100.0000 mg | ORAL_TABLET | Freq: Every day | ORAL | Status: DC

## 2011-10-10 MED ORDER — OMEPRAZOLE 20 MG PO CPDR: 20.0000 mg | DELAYED_RELEASE_CAPSULE | Freq: Every day | ORAL | Status: DC

## 2011-10-10 NOTE — Progress Notes (Signed)
Subjective:     Patient ID: [name], female   DOB: 1935/03/07, 77 y.o.   MRN: [id number]  HPI 77 y/o WF here for a follow up visit... she is followed both here and at the [place] where she gets her meds ([facility])... she has mult med problems including hx refractory AB requiring Advair, Ventolin, Mucinex;  Cerebrovas dis w/ right CAE 6/10 by DrLawson;  Hypothy on synthroid;  GERD & Divertics w/ sigm colectomy 2003 for diverticulitis;  DJD w/ right TKR 2008 by DrAlusio; and Anxiety...  ~  January 10, 2009:  she developed loss of vision in right eye on 6/1 and was eval by [facility] DrMatthews... also had TIA manifestation w/ transient slurring of speech and ? arm weakness... sent to [facility] w/ repeat CDoppler showing similar 60-79% right stenosis + 40-59% LICA stenosis... she is set up for right CAE on 6/16... pre-op labs done today w/ +UTI- we will Rx w/ Cipro...  ~  October 09, 2010:  18mo ROV- she continues to get her meds at [facility] notes sl incr SOB/ DOE recently- hard for her to describe "I have the old asthma cough", no phlegm, no CP or cardiac symptoms;  we decided to recheck her CXR (COPD, mild scarring, cardiomeg, NAD) & PFT (more restricted than obstructed);  refill meds, discussed incr exerc program...    BP controlled on Losartan 50mg /d w/ BP= 130/82;  denies CP, palpit, edema, etc> and there are no cerebral ischemic symptoms (remains on ASA 81mg /d)...    Other medical problems remain stable>  Thyroid OK on Synth100 w/ TSH= 0.46;  GERD controlled w/ Prilosec & hx sigm colectomy for diverticulitis & ?when due for f/u colon (she will check w/ [facility] as DrWeissman has retired);  her GYN is [facility] & she tells me she saw DrKimbrough for UTIs but we don't have notes from him;  DJD & LBP followed by DrALusio (prev right TKR 2008);  still under incredible stress w/ grandsons critical illness (testic cancer surg then necrotizing fasciitis)...  ~  October 10, 2011:  Yearly ROV & Seda states that she is doing  well w/o new complaints or concerns;  She wants refill prescriptions for [facility]- see meds below...    AB> she is NOT using the Advair, only the ProventilHFA as needed; no recent URIs or resp exac...    HBP> on Losartan50; BP= 150/84 & even better at home she says; denies HA, CP, palpit, SOB, edema, etc...    Cerebrovasc dis> on ASA daily; s/p right CAE 2010 by Windell Moulding; she has yearly f/u w/ [facility] & last seen 8/12 w/ Carotid Duplex showing patent right CAE w/o restenosis, & 0-39% left ICA stenosis w/ antegrade vertebrals...    Hypothy> on Synthroid100; TSH=0.92 & she is clinically & biochemically euthyroid...    GI> GERD on Prilosec20/d, Hx divertics w/ sigmoid colectomy 2003 by DrHoxworth; prev followed by DrWeissman- ?last colon 2003? Referred to [facility] for on-going care...    DJD> she is s/p right TKR 2008 by DrAlusio; known DJD in spine, & bursitis in right hip w/ shot from DrAlusio; she uses Vicodin & osteo-biflex...    Anxiety/ Depression> not on anxiolytic meds but under stress w/ family issues; she declines med rx... LABS 3/13:  FLP- she has excellent HDL; Chems- wnl;  CBC- wnl w/ Hg=12.1;  TSH=0.92 on Synth100;  VitD=41    Current Problems:   BRONCHITIS, RECURRENT (ICD-491.9) - she is a non-smoker... episode refractory AB finally resolved 4/10 w/ Ab's/ Pred/  Mucinex/ etc; prev on Advair she stopped on her own & just uses AlbutHFA inhaler as needed...  HYPERTENSION (ICD-401.9) - on COZAAR 50mg /d... ~  2DEcho 8/91 showed mild MR, trace AI, EF=60%... ~  NuclearStressTest 11/95 was normal...  ~  3/12:  BP= 120/70 & she says similar at home... takes med regularly and tol well... denies HA, fatigue, visual changes, CP, palipit, dizziness, syncope, dyspnea, edema, etc... ~  3/13:  BP= 150/84 & even better at home; she remains asymptomatic...  CEREBROVASCULAR DISEASE (ICD-437.9) - on ASA 81mg /d... she has hx small vessel ischemic dis and has been seen by DrWillis for Neuro w/ hx amaurosis  fugax in 2006... 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 ~  MRI Brain 2006 w/ sm vessel ischemic dis & 3mm intracavernous carotid aneurym- being followed... ~  CDopplers 3/08 showed bilat ICA plaque, 0-39% bilat stenoses (no change from prev). ~  CDopplers 3/09 showed 40-59% right ICA stenosis & 0-39% left ICA stenosis; she was asymptomatic... ~  CDopplers 3/10 showed mod plaque in prox ICAs bilat w/ 60-79% RICA & 0-39% LICA stenoses. ~  6/10: developed transient loss of vision in right eye- ophthal eval by DrMatthews & vasc eval DrLawson w/ repeat CDopplers showing 60-79% RICA stenosis, & 40-59% LICA stenosis ==> right RAE 6/10. ~  CDopplers followed by DrLawson's office since her surg & stable w/ patent right ICA endart site & 40-59% (low end) LICA stenosis felt to be stable & nonprogressive (done 7/11 & 8/12)...  HYPOTHYROIDISM (ICD-244.9) - on SYNTHROID 15mcg/d... feeling well, energy is good. ~  Labs 3/13 on Synth100 showed TSH= 0.92  GERD (ICD-530.81) - on PRILOSEC 20mg /d... GI=DrWeissman & last EGD 8/06 showed esoph stricture- dilated... rec for Prilosec daily...   DIVERTICULOSIS OF COLON (ICD-562.10) - she is s/p sigmoid colectomy 6/03 by DrHoxworth... ? if she has had a f/u colonoscopy... ~  3/13:  She never ret to [employer], now retired... We will set her up to see DrKaplan for f/u colonoscopy...  DEGENERATIVE JOINT DISEASE (ICD-715.90) - know CSpine DJD w/ osteophytes on prev scan... she is s/p right TKR 1/08 by DrAlusio... ~  3/13:  She noted right hip pain & given shot for bursitis by DrAlusio...  BACK PAIN, LUMBAR (ICD-724.2) - she takes ALEVE & VICODIN as needed.  ANXIETY (ICD-300.00) DEPRESSION (ICD-311) - she takes cranberry extract for bladder stem problems- OK... not on anxiety or depression meds but under considerable stress w/ grandson's illness...   Past Surgical History  Procedure Date  . Vaginal hysterectomy   . Sigmoid colectomy   . Right tkr      Outpatient Encounter Prescriptions as of 10/10/2011  Medication Sig Dispense Refill  . albuterol (PROVENTIL HFA;VENTOLIN HFA) 108 (90 BASE) MCG/ACT inhaler Inhale 2 puffs into the lungs every 4 (four) hours as needed.      Marland Kitchen aspirin 81 MG tablet Take 81 mg by mouth daily.      . Calcium Carbonate-Vit D-Min 600-400 MG-UNIT TABS Take 1 tablet by mouth daily.      Marland Kitchen estrogens, conjugated, (PREMARIN) 0.9 MG tablet Take 0.9 mg by mouth daily. Take daily for 21 days then do not take for 7 days.      . fish oil-omega-3 fatty acids 1000 MG capsule Take 2 g by mouth daily.      . Fluticasone-Salmeterol (ADVAIR) 250-50 MCG/DOSE AEPB Inhale 1 puff into the lungs every 12 (twelve) hours as needed.      Marland Kitchen HYDROcodone-acetaminophen (VICODIN) 5-500  MG per tablet Take 1 tablet by mouth every 6 (six) hours as needed.      Marland Kitchen levothyroxine (SYNTHROID, LEVOTHROID) 100 MCG tablet Take 100 mcg by mouth daily.      Marland Kitchen losartan (COZAAR) 50 MG tablet Take 50 mg by mouth daily.      . Misc Natural Products (OSTEO BI-FLEX ADV DOUBLE ST PO) Take 2 tablets by mouth daily.      Marland Kitchen omeprazole (PRILOSEC) 20 MG capsule Take 20 mg by mouth daily.      . vitamin B-12 (CYANOCOBALAMIN) 1000 MCG tablet Take 1,000 mcg by mouth daily.        Allergies  Allergen Reactions  . Penicillins     REACTION: hives  . Relafen (Nabumetone)     Current Medications, Allergies, Past Medical History, Past Surgical History, Family History, and Social History were reviewed in [employer] record.   Review of Systems        The patient complains of dyspnea on exertion, gas/bloating, urinary hesitancy, nocturia, back pain, joint pain, arthritis, difficulty walking, and depression.  The patient denies fever, chills, sweats, anorexia, fatigue, weakness, malaise, weight loss, sleep disorder, blurring, diplopia, eye irritation, eye discharge, vision loss, eye pain, photophobia, earache, ear discharge, tinnitus, decreased  hearing, nasal congestion, nosebleeds, sore throat, hoarseness, chest pain, palpitations, syncope, orthopnea, PND, peripheral edema, cough, dyspnea at rest, excessive sputum, hemoptysis, wheezing, pleurisy, nausea, vomiting, diarrhea, constipation, change in bowel habits, abdominal pain, melena, hematochezia, jaundice, indigestion/heartburn, dysphagia, odynophagia, dysuria, hematuria, urinary frequency, incontinence, joint swelling, muscle cramps, muscle weakness, stiffness, sciatica, restless legs, leg pain at night, leg pain with exertion, rash, itching, dryness, suspicious lesions, paralysis, paresthesias, seizures, tremors, vertigo, transient blindness, frequent falls, frequent headaches, anxiety, memory loss, confusion, cold intolerance, heat intolerance, polydipsia, polyphagia, polyuria, unusual weight change, abnormal bruising, bleeding, enlarged lymph nodes, urticaria, allergic rash, hay fever, and recurrent infections.     Objective:   Physical Exam    WD, WN, 77 y/o WF in NAD... GENERAL:  Alert & oriented; pleasant & cooperative... HEENT:  [place]/AT, EOM-wnl, left marcus-gunn pupil, EACs-clear, TMs-wnl, NOSE-clear, THROAT-clear & wnl. NECK:  Supple w/ fairROM; no JVD; normal carotid impulses w/ faint right bruit; no thyromegaly or nodules palpated; no lymphadenopathy. CHEST:  Clear to P & A; without wheezes/ rales/ or rhonchi heard... HEART:  Regular Rhythm; without murmurs/ rubs/ or gallops detected... ABDOMEN:  Soft & nontender; normal bowel sounds; no organomegaly or masses palpated... EXT: without deformities, mild arthritic changes; no varicose veins/ +venous insuffic/ no edema. NEURO:  no focal neuro deficits x part loss of vision left eye... DERM:  No lesions noted; no rash etc...  RADIOLOGY DATA:  Reviewed in the EPIC EMR & discussed w/ the patient...    >>Last CXR 3/12 showed cardiomeg w/o CHF; COPD w/ scarring left base, NAD...  LABORATORY DATA:  Reviewed in the EPIC EMR &  discussed w/ the patient...    >>LABS 3/13:  FLP- she has excellent HDL; Chems- wnl;  CBC- wnl w/ Hg=12.1;  TSH=0.92 on Synth100;  VitD=41   Assessment:     AB>  She uses the ProventilHFA as needed & hasn't needed; ok as long as she avoids infections...  HBP>  Controlled on Losartan + diet> low sodium & needs to lose weight!  Cerebrovasc Dis>  Followed by [facility] & stable s/p right CAE in 2010; continue ASA & [facility] follow up...  Hypothyroid>  Stable on Synthroid 128mcg/d; continue same...  GERD>  Stable on  PPI Rx daily; continue Prilosec...  Divertics>  S/p sigmoid colectomy 2003; she is in need of GI f/u & 19yr colonoscopy- refer to DrKaplan...  DJD>  S/p right TKR w/ known back pain & bursitis right hip- s/p shot per DrAlusio...  Anxiety/ Depression>  Not on meds and she feels she is doing satis & doesn't want med rx...     Plan:     Patient's Medications  New Prescriptions   LOSARTAN (COZAAR) 100 MG TABLET    Take 1 tablet (100 mg total) by mouth daily.  Previous Medications   ASPIRIN 81 MG TABLET    Take 81 mg by mouth daily.   CALCIUM CARBONATE-VIT D-MIN 600-400 MG-UNIT TABS    Take 1 tablet by mouth daily.   ESTROGENS, CONJUGATED, (PREMARIN) 0.9 MG TABLET    Take 0.9 mg by mouth daily. Take daily for 21 days then do not take for 7 days.   FISH OIL-OMEGA-3 FATTY ACIDS 1000 MG CAPSULE    Take 2 g by mouth daily.   FLUTICASONE-SALMETEROL (ADVAIR) 250-50 MCG/DOSE AEPB    Inhale 1 puff into the lungs every 12 (twelve) hours as needed.   HYDROCODONE-ACETAMINOPHEN (VICODIN) 5-500 MG PER TABLET    Take 1 tablet by mouth every 6 (six) hours as needed.   MISC NATURAL PRODUCTS (OSTEO BI-FLEX ADV DOUBLE ST PO)    Take 2 tablets by mouth daily.   VITAMIN B-12 (CYANOCOBALAMIN) 1000 MCG TABLET    Take 1,000 mcg by mouth daily.  Modified Medications   Modified Medication Previous Medication   ALBUTEROL (PROVENTIL HFA;VENTOLIN HFA) 108 (90 BASE) MCG/ACT INHALER albuterol (PROVENTIL  HFA;VENTOLIN HFA) 108 (90 BASE) MCG/ACT inhaler      Inhale 2 puffs into the lungs every 4 (four) hours as needed.    Inhale 2 puffs into the lungs every 4 (four) hours as needed.   LEVOTHYROXINE (SYNTHROID, LEVOTHROID) 100 MCG TABLET levothyroxine (SYNTHROID, LEVOTHROID) 100 MCG tablet      Take 1 tablet (100 mcg total) by mouth daily.    Take 100 mcg by mouth daily.   OMEPRAZOLE (PRILOSEC) 20 MG CAPSULE omeprazole (PRILOSEC) 20 MG capsule      Take 1 capsule (20 mg total) by mouth daily.    Take 20 mg by mouth daily.  Discontinued Medications   LOSARTAN (COZAAR) 50 MG TABLET    Take 50 mg by mouth daily.

## 2011-10-10 NOTE — Patient Instructions (Signed)
Today we updated your med list in our EPIC system...    Continue your current medications the same...    We refilled your prescriptions per your request...  Today we did your follow up fasting blood work...    Please call the [facility]ONE [facility]REE in a few days for your results...    Dial [id number] & when prompted enter your patient number followed by the # symbol...    Your patient number is:  [phone]#  Call for any questions or if we can be of service in any way...  Let's continue our yearly check ups.Marland KitchenMarland Kitchen

## 2011-10-11 LAB — VITAMIN D 25 HYDROXY (VIT D DEFICIENCY, FRACTURES): Vit D, 25-Hydroxy: 41 ng/mL (ref 30–89)

## 2011-11-03 ENCOUNTER — Emergency Department (HOSPITAL_COMMUNITY)
Admission: EM | Admit: 2011-11-03 | Discharge: 2011-11-03 | Disposition: A | Discharge status: Home / Self Care | Payer: Medicare Other | Attending: Emergency Medicine | Admitting: Emergency Medicine

## 2011-11-03 ENCOUNTER — Emergency Department (HOSPITAL_COMMUNITY): Payer: Medicare Other

## 2011-11-03 ENCOUNTER — Other Ambulatory Visit: Payer: Self-pay

## 2011-11-03 ENCOUNTER — Encounter (HOSPITAL_COMMUNITY): Payer: Self-pay | Admitting: Nurse Practitioner

## 2011-11-03 DIAGNOSIS — Z79899 Other long term (current) drug therapy: Secondary | ICD-10-CM | POA: Insufficient documentation

## 2011-11-03 DIAGNOSIS — J449 Chronic obstructive pulmonary disease, unspecified: Secondary | ICD-10-CM

## 2011-11-03 DIAGNOSIS — K219 Gastro-esophageal reflux disease without esophagitis: Secondary | ICD-10-CM | POA: Diagnosis not present

## 2011-11-03 DIAGNOSIS — S37009A Unspecified injury of unspecified kidney, initial encounter: Secondary | ICD-10-CM | POA: Diagnosis not present

## 2011-11-03 DIAGNOSIS — R079 Chest pain, unspecified: Secondary | ICD-10-CM | POA: Diagnosis not present

## 2011-11-03 DIAGNOSIS — J984 Other disorders of lung: Secondary | ICD-10-CM | POA: Diagnosis not present

## 2011-11-03 DIAGNOSIS — J4489 Other specified chronic obstructive pulmonary disease: Secondary | ICD-10-CM | POA: Insufficient documentation

## 2011-11-03 DIAGNOSIS — I10 Essential (primary) hypertension: Secondary | ICD-10-CM | POA: Diagnosis not present

## 2011-11-03 DIAGNOSIS — R0602 Shortness of breath: Secondary | ICD-10-CM | POA: Diagnosis not present

## 2011-11-03 DIAGNOSIS — Z8673 Personal history of transient ischemic attack (TIA), and cerebral infarction without residual deficits: Secondary | ICD-10-CM | POA: Insufficient documentation

## 2011-11-03 DIAGNOSIS — N179 Acute kidney failure, unspecified: Secondary | ICD-10-CM | POA: Insufficient documentation

## 2011-11-03 DIAGNOSIS — E039 Hypothyroidism, unspecified: Secondary | ICD-10-CM | POA: Diagnosis not present

## 2011-11-03 DIAGNOSIS — Z7982 Long term (current) use of aspirin: Secondary | ICD-10-CM | POA: Insufficient documentation

## 2011-11-03 DIAGNOSIS — F341 Dysthymic disorder: Secondary | ICD-10-CM | POA: Insufficient documentation

## 2011-11-03 LAB — BASIC METABOLIC PANEL
Calcium: 9.9 mg/dL (ref 8.4–10.5)
GFR calc Af Amer: 46 mL/min — ABNORMAL LOW (ref >90)
GFR calc non Af Amer: 40 mL/min — ABNORMAL LOW (ref >90)
Potassium: 4.6 mEq/L (ref 3.5–5.1)
Sodium: 133 mEq/L — ABNORMAL LOW (ref 135–145)

## 2011-11-03 LAB — CBC
Hemoglobin: 12.4 g/dL (ref 12.0–15.0)
MCH: 32.7 pg (ref 26.0–34.0)
MCHC: 35.4 g/dL (ref 30.0–36.0)
Platelets: 253 10*3/uL (ref 150–400)
RDW: 12.6 % (ref 11.5–15.5)

## 2011-11-03 LAB — GLUCOSE, CAPILLARY
Glucose-Capillary: 82 mg/dL (ref 70–99)
Glucose-Capillary: 113 mg/dL — ABNORMAL HIGH (ref 70–99)

## 2011-11-03 LAB — TROPONIN I: Troponin I: <0.3 ng/mL (ref <0.30)

## 2011-11-03 NOTE — ED Notes (Signed)
CBG was 82. Notified Nurse Aundra Millet.

## 2011-11-03 NOTE — Discharge Instructions (Signed)
As we discussed, there was no evidence of a pneumonia on your chest x-ray or fluid on your lungs. Your EKG did not show any concerning changes. Your labs did not show any electrolyte problems. However, there is an elevation in the enzymes in your blood that we look at her kidney function. This indicates that your kidneys are not functioning as well as they were several weeks ago. You should decrease her Cozaar back to 50 mg. You should not take any NSAIDs. This includes Advil, Aleve, ibuprofen, naproxen. You can still use the diclofenac and that you apply to your skin only.      Chronic Obstructive Pulmonary Disease Chronic obstructive pulmonary disease (COPD) is a lung disease. The lungs become damaged, making it hard to get air in and out of your lungs. The damage to your lungs cannot be changed.  HOME CARE  Stop smoking if you smoke. Avoid secondhand smoke.   Only take medicine as told by your doctor.   Talk to your doctor about using cough syrup or over-the-counter medicines.   Drink enough fluids to keep your pee (urine) clear or pale yellow.   Use a humidifier or vaporizer. This may help loosen the thick spit (mucus).   Talk to your doctor about vaccines that help prevent other lung problems (pneumonia and flu vaccines).   Use home oxygen as told by your doctor.   Stay active and exercise.   Eat healthy foods.  GET HELP RIGHT AWAY IF:   Your heart is beating fast.   You become disturbed, confused, shake, or are dazed.   You have trouble breathing.   You have chest pain.   You have a fever.   You cough up thick spit that is yellowish-white or green.   Your breathing becomes worse when you exercise.   You are running out of the medicine you take for your breathing.  MAKE SURE YOU:   Understand these instructions.   Will watch your condition.   Will get help right away if you are not doing well or get worse.  Document Released: 01/02/2008 Document Revised:  07/05/2011 Document Reviewed: 09/15/2010 [facility] Patient Information 2012 [place], [place].

## 2011-11-03 NOTE — ED Notes (Signed)
Pt called from in waiting room, no answer

## 2011-11-03 NOTE — ED Notes (Signed)
Pt O2 dropped down to 84% while walked pt heart rate was 94. Pt is back in the bed and O2 is 100% and heart rate is 75

## 2011-11-03 NOTE — ED Provider Notes (Signed)
History     CSN: [id number]  Arrival date & time 11/03/11  1359   First MD Initiated Contact with Patient 11/03/11 1733      Chief Complaint  Patient presents with  . Shortness of Breath    (Consider location/radiation/quality/duration/timing/severity/associated sxs/prior treatment) Patient is a 77 y.o. female presenting with shortness of breath. The history is provided by the patient.  Shortness of Breath  The current episode started more than 1 week ago. The onset was gradual. The problem occurs frequently. The problem has been gradually worsening. The problem is moderate. The symptoms are relieved by rest. The symptoms are aggravated by activity. Associated symptoms include shortness of breath. Pertinent negatives include no chest pain, no chest pressure, no orthopnea, no fever, no cough and no wheezing. Past medical history comments: asthma, chronic bronchitis. Urine output has been normal. Recent Medical Care: seen by PCP 3 weeks ago, but denies she was having any symptoms at that time.  Pt has a reported hx of TIA vs CVA with visual loss in the right eye as well as carotid stent to the right side.  Past Medical History  Diagnosis Date  . History of sudden visual loss   . Acute cystitis   . Bronchitis, acute   . Hypertension   . Cerebrovascular disease   . Hypothyroidism   . GERD (gastroesophageal reflux disease)   . Diverticulosis of colon   . DJD (degenerative joint disease)   . Back pain   . Transient ischemic attack   . Anxiety   . Depression     Past Surgical History  Procedure Date  . Vaginal hysterectomy   . Sigmoid colectomy   . Right tkr   . Tubal ligation   . Replacement total knee     Family History  Problem Relation Age of Onset  . Emphysema Father   . Heart disease Mother   . Coronary artery disease Sister   . Rheumatic fever Brother     x2    History  Substance Use Topics  . Smoking status: Never Smoker   . Smokeless tobacco: Not on file  .  Alcohol Use: No     Review of Systems  Constitutional: Negative for fever.  Respiratory: Positive for shortness of breath. Negative for cough and wheezing.   Cardiovascular: Negative for chest pain and orthopnea.  All other systems reviewed and are negative.    Allergies  Relafen and Penicillins  Home Medications   Current Outpatient Rx  Name Route Sig Dispense Refill  . ALBUTEROL SULFATE HFA 108 (90 BASE) MCG/ACT IN AERS Inhalation Inhale 2 puffs into the lungs every 6 (six) hours as needed. For shortness of breath    . ASPIRIN 81 MG PO TABS Oral Take 81 mg by mouth daily.    Marland Kitchen CALCIUM CARBONATE-VIT D-MIN 600-400 MG-UNIT PO TABS Oral Take 2 tablets by mouth daily.     Marland Kitchen DICLOFENAC SODIUM 1 % TD GEL Topical Apply 1 application topically daily as needed. For pain    . ESTROGENS CONJUGATED 0.9 MG PO TABS Oral Take 0.9 mg by mouth daily. Take daily for 21 days then do not take for 7 days.    . OMEGA-3 FATTY ACIDS 1000 MG PO CAPS Oral Take 3 g by mouth daily.     Marland Kitchen HYDROCODONE-ACETAMINOPHEN 5-500 MG PO TABS Oral Take 1 tablet by mouth every 6 (six) hours as needed. For pain    . LEVOTHYROXINE SODIUM 100 MCG PO TABS Oral Take 1  tablet (100 mcg total) by mouth daily. 90 tablet 3  . LOSARTAN POTASSIUM 100 MG PO TABS Oral Take 1 tablet (100 mg total) by mouth daily. 90 tablet 3  . OSTEO BI-FLEX ADV DOUBLE ST PO Oral Take 2 tablets by mouth daily.    Marland Kitchen NAPROXEN SODIUM 220 MG PO TABS Oral Take 220 mg by mouth 2 (two) times daily with a meal.    . OMEPRAZOLE 20 MG PO CPDR Oral Take 1 capsule (20 mg total) by mouth daily. 90 capsule 3  . AZO-STANDARD PO Oral Take 2 tablets by mouth every morning.    Marland Kitchen VITAMIN B-12 1000 MCG PO TABS Oral Take 1,000 mcg by mouth daily.      BP 160/67  Pulse 73  Temp(Src) 97.6 F (36.4 C) (Oral)  Resp 20  Ht 5\' 5"  (1.651 m)  Wt 189 lb (85.73 kg)  BMI 31.45 kg/m2  SpO2 100%  Physical Exam  Constitutional: She is oriented to person, place, and time. She  appears well-developed and well-nourished. No distress.       VS reviewed, sig for HTN  HENT:  Head: Normocephalic and atraumatic.  Right Ear: External ear normal.  Left Ear: External ear normal.  Nose: Nose normal.  Mouth/Throat: Oropharynx is clear and moist.  Eyes: Conjunctivae and EOM are normal. Pupils are equal, round, and reactive to light.  Neck: Normal range of motion. Neck supple.       No carotid bruit heard  Cardiovascular: Normal rate, regular rhythm and normal heart sounds.   Pulmonary/Chest: Effort normal and breath sounds normal. No respiratory distress. She has no wheezes. She has no rales. She exhibits no tenderness.       Speaks in paragraphs.  Abdominal: Soft. Bowel sounds are normal. She exhibits no distension. There is no tenderness.  Musculoskeletal: She exhibits no edema and no tenderness.  Neurological: She is alert and oriented to person, place, and time. No cranial nerve deficit.  Skin: Skin is warm and dry. No rash noted.  Psychiatric: She has a normal mood and affect.    ED Course  Procedures (including critical care time)  Labs Reviewed  GLUCOSE, CAPILLARY - Abnormal; Notable for the following:    Glucose-Capillary 113 (*)    All other components within normal limits  CBC - Abnormal; Notable for the following:    RBC 3.79 (*)    HCT 35.0 (*)    All other components within normal limits  BASIC METABOLIC PANEL - Abnormal; Notable for the following:    Sodium 133 (*)    Glucose, Bld 101 (*)    BUN 32 (*)    Creatinine, Ser 1.29 (*)    GFR calc non Af Amer 40 (*)    GFR calc Af Amer 46 (*)    All other components within normal limits  GLUCOSE, CAPILLARY  TROPONIN I   Dg Chest 2 View  11/03/2011  *RADIOLOGY REPORT*  Clinical Data: Chest pain and shortness of breath  CHEST - 2 VIEW  Comparison: 10/09/2010  Findings: Heart size appears normal.  No pleural effusion or pulmonary edema.  Increased lung volumes.  The scarring is again noted within the  left lung base.  IMPRESSION:  1.  No acute findings. 2.  Stable left base scarring.  Original Report Authenticated By: Rosealee Albee, M.D.    Date: 11/03/2011  Rate: 71  Rhythm: normal sinus rhythm  QRS Axis: normal  Intervals: normal  ST/T Wave abnormalities: nonspecific T wave  changes  Conduction Disutrbances:none  Narrative Interpretation: Prior ECG dated 01/10/2009, current ECG with TWI in leads aVR, aVL, V1. Prior tracing with TWI in aVR, V1, flat t in aVL.  Old EKG Reviewed: changes noted    1. COPD (chronic obstructive pulmonary disease)   2. Acute kidney injury       MDM  Exertional dyspnea x 1 week. Otherwise negative ROS. Review of pt chart shows hx COPD. Visit with PCP approx 4 weeks ago mentions a c/o DOE. In ED, pt asymptomatic on examination. PulseOx dropped to 84% on RA while ambulating after approx 30 feet, then returned to 100% at rest. In the setting of a relatively unremarkable ECG, negative troponin with sx for at least 1 week, and hypoxia with ambulation appearance most c/w COPD rather than anginal pain. Labs are reviewed and are sig for new mild renal insufficiency, not present several weeks ago. Pt notes that she has been doubling her dose of Cozaar as her PCP recommended changing the dose when she got a refill- review of this medication side effects show renal insufficiency as a possibility.   I have recommended home oxygen, which is being arranged through [facility] and will be brought to the patient at home. In addition, I have recommended reducing her dose of Cozaar by half (to return to prior dose) until she can follow-up with her doctor regarding her new mild renal insufficiency. She has also been advised not to take any oral NSAIDs in the interim. Pt voices understanding of plan and is comfortable with d/c home.         Shaaron Adler, [place] 11/03/11 2203

## 2011-11-03 NOTE — ED Notes (Signed)
Pt c/o sob especially on exertion over past week. No pain. A&Ox4. Breathing easily at rest

## 2011-11-03 NOTE — ED Provider Notes (Addendum)
Complains of exertional dyspnea for one week. Patient asymptomatic at rest becomes dyspneic with minimal exertion no associated chest pain nausea or sweating patient asymptomatic as I examine her,. History of stroke, carotid stents On exam no distress lungs clear to auscultation heart regular rate and rhythm extremities without edema   Doug Sou, MD 11/03/11 1819  Note patient's pulse oximetry dropped to 84% on room air when she walked. Past medical history consistent with COPD. Drop in pulse ox consistent with COPD rather than anginal equivalent  Doug Sou, MD 11/03/11 1857

## 2011-11-03 NOTE — ED Notes (Signed)
Pt felt the her blood sugar had dropped blood sugar was checked it is 113

## 2011-11-04 NOTE — ED Provider Notes (Signed)
Medical screening examination/treatment/procedure(s) were conducted as a shared visit with non-physician practitioner(s) and myself.  I personally evaluated the patient during the encounter  Doug Sou, MD 11/04/11 0150

## 2011-11-05 ENCOUNTER — Ambulatory Visit (INDEPENDENT_AMBULATORY_CARE_PROVIDER_SITE_OTHER): Payer: Medicare Other | Admitting: Adult Health

## 2011-11-05 ENCOUNTER — Encounter: Payer: Self-pay | Admitting: Adult Health

## 2011-11-05 ENCOUNTER — Telehealth: Payer: Self-pay | Admitting: Pulmonary Disease

## 2011-11-05 VITALS — BP 112/70 | HR 79 | Temp 96.9°F | Ht 65.0 in | Wt 188.2 lb

## 2011-11-05 DIAGNOSIS — J45909 Unspecified asthma, uncomplicated: Secondary | ICD-10-CM | POA: Diagnosis not present

## 2011-11-05 NOTE — Assessment & Plan Note (Signed)
?  recent Flare  Workup has been unrvealing w/ neg CXR, EKG, troponin . Spirometry today nml without desats.   Plan:  Will restart on Advair.  Close follow up in 6 weeks and As needed   Please contact office for sooner follow up if symptoms do not improve or worsen or seek emergency care

## 2011-11-05 NOTE — Telephone Encounter (Signed)
Spoke with patient-States on Saturday 11-03-2011 she was outside and started having increased SOB with activity; if at rest no problems with breathing. She felt feeling like she was going to pass out and as if she had been running on the treadmill. She went to the ER; they walked her around the nurses station; O2 dropped to 84% Room Air. They placed her on 1L/M O2 and walked her again-sats stayed between 97-100%; however the ER did not send patient home on O2 due to the fact when walking without O2 (after being on O2 for a little bit) she did not drop. They requested patient be seen today by our office. Also, patient noted that she was having body cramps while on Cozaar 100mg ; the ER dropped the patients Cozaar back to 50mg  QD. Pt was placed on TP's schedule today at 10:45 per Leigh and pt is aware of appt time.

## 2011-11-05 NOTE — Progress Notes (Signed)
Subjective:     Patient ID: [name], female   DOB: [birth year], 77 y.o.   MRN: [id number]  HPI  77 y/o WF here   ~  January 10, 2009:  she developed loss of vision in right eye on 6/1 and was eval by [facility] DrMatthews... also had TIA manifestation w/ transient slurring of speech and ? arm weakness... sent to [facility] w/ repeat CDoppler showing similar 60-79% right stenosis + 40-59% LICA stenosis... she is set up for right CAE on 6/16... pre-op labs done today w/ +UTI- we will Rx w/ Cipro...  ~  October 09, 2010:  21mo ROV- she continues to get her meds at [facility] notes sl incr SOB/ DOE recently- hard for her to describe "I have the old asthma cough", no phlegm, no CP or cardiac symptoms;  we decided to recheck her CXR (COPD, mild scarring, cardiomeg, NAD) & PFT (more restricted than obstructed);  refill meds, discussed incr exerc program...    BP controlled on Losartan 50mg /d w/ BP= 130/82;  denies CP, palpit, edema, etc> and there are no cerebral ischemic symptoms (remains on ASA 81mg /d)...    Other medical problems remain stable>  Thyroid OK on Synth100 w/ TSH= 0.46;  GERD controlled w/ Prilosec & hx sigm colectomy for diverticulitis & ?when due for f/u colon (she will check w/ [facility] as DrWeissman has retired);  her GYN is Sports administrator & she tells me she saw DrKimbrough for UTIs but we don't have notes from him;  DJD & LBP followed by DrALusio (prev right TKR 2008);  still under incredible stress w/ grandsons critical illness (testic cancer surg then necrotizing fasciitis)...  ~  October 10, 2011:  Yearly ROV & Joellyn states that she is doing well w/o new complaints or concerns;  She wants refill prescriptions for [employer]- see meds below...    AB> she is NOT using the Advair, only the ProventilHFA as needed; no recent URIs or resp exac...    HBP> on Losartan50; BP= 150/84 & even better at home she says; denies HA, CP, palpit, SOB, edema, etc...    Cerebrovasc dis> on ASA daily; s/p right CAE 2010 by  Windell Moulding; she has yearly f/u w/ [facility] & last seen 8/12 w/ Carotid Duplex showing patent right CAE w/o restenosis, & 0-39% left ICA stenosis w/ antegrade vertebrals...    Hypothy> on Synthroid100; TSH=0.92 & she is clinically & biochemically euthyroid...    GI> GERD on Prilosec20/d, Hx divertics w/ sigmoid colectomy 2003 by DrHoxworth; prev followed by DrWeissman- ?last colon 2003? Referred to [facility] for on-going care...    DJD> she is s/p right TKR 2008 by DrAlusio; known DJD in spine, & bursitis in right hip w/ shot from DrAlusio; she uses Vicodin & osteo-biflex...    Anxiety/ Depression> not on anxiolytic meds but under stress w/ family issues; she declines med rx... LABS 3/13:  FLP- she has excellent HDL; Chems- wnl;  CBC- wnl w/ Hg=12.1;  TSH=0.92 on Synth100;  VitD=41   11/05/2011 Acute OV  Complains of increased SOB x 2 weeks. Went to ER on 4/6  Due to  increased SOB with activity; if at rest no problems with breathing. She was outside working in flowers. She felt feeling like she was going to pass out and as if she had been running on the treadmill. She went to the ER; they walked her around the nurses station; O2 dropped to 84% Room Air. They placed her on 1L/M O2 and walked her again-sats stayed  between 97-100%; started on O2 .   They requested patient be seen today by our office.  Chest x-ray showed no acute process. Lab work revealed a negative troponin. Patient's Cozaar was decreased to half dose. Patient has a history of asthma. She is a never smoker. Previous spirometry in March of 2012 showed restriction than  airway obstruction, with an FEV1 of 1.37 L, which was 55% of predicted. Normal ratio . She was previously on Advair however, stopped this on her on. No leg swelling or calf pain. Started weight loss nutrisystem this past few weeks , lost 10 lbs .  No chest pain . Today in office o2 sats remained 96% with walking 3 laps.  Fev1 today was nml at 1.83L 84%    Current Problems:    BRONCHITIS, RECURRENT (ICD-491.9) - she is a non-smoker... episode refractory AB finally resolved 4/10 w/ Ab's/ Pred/ Mucinex/ etc; prev on Advair she stopped on her own & just uses AlbutHFA inhaler as needed...  HYPERTENSION (ICD-401.9) - on COZAAR 50mg /d... ~  2DEcho 8/91 showed mild MR, trace AI, EF=60%... ~  NuclearStressTest 11/95 was normal...  ~  3/12:  BP= 120/70 & she says similar at home... takes med regularly and tol well... denies HA, fatigue, visual changes, CP, palipit, dizziness, syncope, dyspnea, edema, etc... ~  3/13:  BP= 150/84 & even better at home; she remains asymptomatic...  CEREBROVASCULAR DISEASE (ICD-437.9) - on ASA 81mg /d... she has hx small vessel ischemic dis and has been seen by DrWillis for Neuro w/ hx amaurosis fugax in 2006... 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 ~  MRI Brain 2006 w/ sm vessel ischemic dis & 3mm intracavernous carotid aneurym- being followed... ~  CDopplers 3/08 showed bilat ICA plaque, 0-39% bilat stenoses (no change from prev). ~  CDopplers 3/09 showed 40-59% right ICA stenosis & 0-39% left ICA stenosis; she was asymptomatic... ~  CDopplers 3/10 showed mod plaque in prox ICAs bilat w/ 60-79% RICA & 0-39% LICA stenoses. ~  6/10: developed transient loss of vision in right eye- ophthal eval by DrMatthews & vasc eval DrLawson w/ repeat CDopplers showing 60-79% RICA stenosis, & 40-59% LICA stenosis ==> right RAE 6/10. ~  CDopplers followed by DrLawson's office since her surg & stable w/ patent right ICA endart site & 40-59% (low end) LICA stenosis felt to be stable & nonprogressive (done 7/11 & 8/12)...  HYPOTHYROIDISM (ICD-244.9) - on SYNTHROID 144mcg/d... feeling well, energy is good. ~  Labs 3/13 on Synth100 showed TSH= 0.92  GERD (ICD-530.81) - on PRILOSEC 20mg /d... GI=DrWeissman & last EGD 8/06 showed esoph stricture- dilated... rec for Prilosec daily...   DIVERTICULOSIS OF COLON (ICD-562.10) - she is s/p sigmoid  colectomy 6/03 by DrHoxworth... ? if she has had a f/u colonoscopy... ~  3/13:  She never ret to [employer], now retired... We will set her up to see DrKaplan for f/u colonoscopy...  DEGENERATIVE JOINT DISEASE (ICD-715.90) - know CSpine DJD w/ osteophytes on prev scan... she is s/p right TKR 1/08 by DrAlusio... ~  3/13:  She noted right hip pain & given shot for bursitis by DrAlusio...  BACK PAIN, LUMBAR (ICD-724.2) - she takes ALEVE & VICODIN as needed.  ANXIETY (ICD-300.00) DEPRESSION (ICD-311) - she takes cranberry extract for bladder stem problems- OK... not on anxiety or depression meds but under considerable stress w/ grandson's illness...   Past Surgical History  Procedure Date  . Vaginal hysterectomy   . Sigmoid colectomy   . Right tkr   .  Tubal ligation   . Replacement total knee     Outpatient Encounter Prescriptions as of 11/05/2011  Medication Sig Dispense Refill  . albuterol (PROVENTIL HFA;VENTOLIN HFA) 108 (90 BASE) MCG/ACT inhaler Inhale 2 puffs into the lungs every 6 (six) hours as needed. For shortness of breath      . aspirin 81 MG tablet Take 81 mg by mouth daily.      . Calcium Carbonate-Vit D-Min 600-400 MG-UNIT TABS Take 2 tablets by mouth daily.       . diclofenac sodium (VOLTAREN) 1 % GEL Apply 1 application topically daily as needed. For pain      . estrogens, conjugated, (PREMARIN) 0.9 MG tablet Take 0.9 mg by mouth daily. Take daily for 21 days then do not take for 7 days.      . fish oil-omega-3 fatty acids 1000 MG capsule Take 3 g by mouth daily.       Marland Kitchen HYDROcodone-acetaminophen (VICODIN) 5-500 MG per tablet Take 1 tablet by mouth every 6 (six) hours as needed. For pain      . levothyroxine (SYNTHROID, LEVOTHROID) 100 MCG tablet Take 1 tablet (100 mcg total) by mouth daily.  90 tablet  3  . losartan (COZAAR) 100 MG tablet Take 1 tablet (100 mg total) by mouth daily.  90 tablet  3  . Misc Natural Products (OSTEO BI-FLEX ADV DOUBLE ST PO) Take 2 tablets by  mouth daily.      Marland Kitchen omeprazole (PRILOSEC) 20 MG capsule Take 1 capsule (20 mg total) by mouth daily.  90 capsule  3  . Phenazopyridine HCl (AZO-STANDARD PO) Take 2 tablets by mouth every morning.      . vitamin B-12 (CYANOCOBALAMIN) 1000 MCG tablet Take 1,000 mcg by mouth daily.        Allergies  Allergen Reactions  . Relafen (Nabumetone)     unknown  . Penicillins Hives, Swelling and Rash    Current Medications, Allergies, Past Medical History, Past Surgical History, Family History, and Social History were reviewed in [employer] record.   Review of Systems Constitutional:   No  weight loss, night sweats,  Fevers, chills,  +fatigue, or  lassitude.  HEENT:   No headaches,  Difficulty swallowing,  Tooth/dental problems, or  Sore throat,                No sneezing, itching, ear ache, nasal congestion, post nasal drip,   CV:  No chest pain,  Orthopnea, PND, swelling in lower extremities, anasarca, dizziness, palpitations, syncope.   GI  No heartburn, indigestion, abdominal pain, nausea, vomiting, diarrhea, change in bowel habits, loss of appetite, bloody stools.   Resp:  .  No excess mucus, no productive cough,  No non-productive cough,  No coughing up of blood.  No change in color of mucus.  No wheezing.  No chest wall deformity  Skin: no rash or lesions.  GU: no dysuria, change in color of urine, no urgency or frequency.  No flank pain, no hematuria   MS:  No joint pain or swelling.  No decreased range of motion.  No back pain.  Psych:  No change in mood or affect. No depression or anxiety.  No memory loss.                Objective:   Physical Exam     WD, WN, 77 y/o WF in NAD... GENERAL:  Alert & oriented; pleasant & cooperative... HEENT:  [place]/AT, EOM-wnl,   EACs-clear,  TMs-wnl, NOSE-clear, THROAT-clear & wnl. NECK:  Supple w/ fairROM; no JVD; normal carotid impulses w/ faint right bruit; no thyromegaly or nodules palpated; no  lymphadenopathy. CHEST:  Clear to P & A; without wheezes/ rales/ or rhonchi heard... HEART:  Regular Rhythm; without murmurs/ rubs/ or gallops detected... ABDOMEN:  Soft & nontender; normal bowel sounds; no organomegaly or masses palpated... EXT: without deformities, mild arthritic changes; no varicose veins/ +venous insuffic/ no edema. NEURO:  no focal neuro deficits x part loss of vision left eye... DERM:  No lesions noted; no rash etc...    Assessment:

## 2011-11-05 NOTE — Patient Instructions (Addendum)
Restart Advair 250 /50 1 puff Twice daily   follow up with Dr. Kriste Basque  In 6 weeks and As needed   Please contact office for sooner follow up if symptoms do not improve or worsen or seek emergency care

## 2011-11-22 ENCOUNTER — Encounter: Payer: Medicare Other | Admitting: Gastroenterology

## 2011-11-29 DIAGNOSIS — M76899 Other specified enthesopathies of unspecified lower limb, excluding foot: Secondary | ICD-10-CM | POA: Diagnosis not present

## 2011-12-18 ENCOUNTER — Encounter: Payer: Self-pay | Admitting: Pulmonary Disease

## 2011-12-18 ENCOUNTER — Ambulatory Visit (INDEPENDENT_AMBULATORY_CARE_PROVIDER_SITE_OTHER): Payer: Medicare Other | Admitting: Pulmonary Disease

## 2011-12-18 VITALS — BP 158/72 | HR 75 | Temp 98.2°F | Ht 65.0 in | Wt 188.0 lb

## 2011-12-18 DIAGNOSIS — I10 Essential (primary) hypertension: Secondary | ICD-10-CM

## 2011-12-18 DIAGNOSIS — M1990 Unspecified osteoarthritis, unspecified site: Secondary | ICD-10-CM

## 2011-12-18 DIAGNOSIS — K219 Gastro-esophageal reflux disease without esophagitis: Secondary | ICD-10-CM

## 2011-12-18 DIAGNOSIS — E039 Hypothyroidism, unspecified: Secondary | ICD-10-CM

## 2011-12-18 DIAGNOSIS — J45909 Unspecified asthma, uncomplicated: Secondary | ICD-10-CM | POA: Diagnosis not present

## 2011-12-18 DIAGNOSIS — I679 Cerebrovascular disease, unspecified: Secondary | ICD-10-CM

## 2011-12-18 DIAGNOSIS — G459 Transient cerebral ischemic attack, unspecified: Secondary | ICD-10-CM

## 2011-12-18 DIAGNOSIS — K5730 Diverticulosis of large intestine without perforation or abscess without bleeding: Secondary | ICD-10-CM

## 2011-12-18 MED ORDER — LOSARTAN POTASSIUM 50 MG PO TABS: 50.0000 mg | ORAL_TABLET | Freq: Every day | ORAL | Status: DC

## 2011-12-18 NOTE — Progress Notes (Signed)
Subjective:     Patient ID: [name], female   DOB: 10/23/1934, 77 y.o.   MRN: [id number]  HPI 77 y/o WF here for a follow up visit... she is followed both here and at the [place] where she gets her meds ([facility])... she has mult med problems including hx refractory AB requiring Advair, Ventolin, Mucinex;  Cerebrovas dis w/ right CAE 6/10 by DrLawson;  Hypothy on synthroid;  GERD & Divertics w/ sigm colectomy 2003 for diverticulitis;  DJD w/ right TKR 2008 by DrAlusio; and Anxiety...  ~  January 10, 2009:  she developed loss of vision in right eye on 6/1 and was eval by [facility] DrMatthews... also had TIA manifestation w/ transient slurring of speech and ? arm weakness... sent to [facility] w/ repeat CDoppler showing similar 60-79% right stenosis + 40-59% LICA stenosis... she is set up for right CAE on 6/16... pre-op labs done today w/ +UTI- we will Rx w/ Cipro...  ~  October 09, 2010:  71mo ROV- she continues to get her meds at [facility] notes sl incr SOB/ DOE recently- hard for her to describe "I have the old asthma cough", no phlegm, no CP or cardiac symptoms;  we decided to recheck her CXR (COPD, mild scarring, cardiomeg, NAD) & PFT (more restricted than obstructed);  refill meds, discussed incr exerc program...    BP controlled on Losartan 50mg /d w/ BP= 130/82;  denies CP, palpit, edema, etc> and there are no cerebral ischemic symptoms (remains on ASA 81mg /d)...    Other medical problems remain stable>  Thyroid OK on Synth100 w/ TSH= 0.46;  GERD controlled w/ Prilosec & hx sigm colectomy for diverticulitis & ?when due for f/u colon (she will check w/ [facility] as DrWeissman has retired);  her GYN is Sports administrator & she tells me she saw DrKimbrough for UTIs but we don't have notes from him;  DJD & LBP followed by DrALusio (prev right TKR 2008);  still under incredible stress w/ grandsons critical illness (testic cancer surg then necrotizing fasciitis)...  ~  October 10, 2011:  Yearly ROV & [name] states that she is doing  well w/o new complaints or concerns;  She wants refill prescriptions for [facility]- see meds below...    AB> she is NOT using the Advair, only the ProventilHFA as needed; no recent URIs or resp exac...    HBP> on Losartan50; BP= 150/84 & even better at home she says; denies HA, CP, palpit, SOB, edema, etc...    Cerebrovasc dis> on ASA daily; s/p right CAE 2010 by Windell Moulding; she has yearly f/u w/ [facility] & last seen 8/12 w/ Carotid Duplex showing patent right CAE w/o restenosis, & 0-39% left ICA stenosis w/ antegrade vertebrals...    Hypothy> on Synthroid100; TSH=0.92 & she is clinically & biochemically euthyroid...    GI> GERD on Prilosec20/d, Hx divertics w/ sigmoid colectomy 2003 by DrHoxworth; prev followed by DrWeissman- ?last colon 2003? Referred to [facility] for on-going care...    DJD> she is s/p right TKR 2008 by DrAlusio; known DJD in spine, & bursitis in right hip w/ shot from DrAlusio; she uses Vicodin & osteo-biflex...    Anxiety/ Depression> not on anxiolytic meds but under stress w/ family issues; she declines med rx... LABS 3/13:  FLP- she has excellent HDL; Chems- wnl;  CBC- wnl w/ Hg=12.1;  TSH=0.92 on Synth100;  VitD=41  ~  Dec 18, 2011:  59mo ROV & post ER follow up> After last visit we increased her Losartan to 100mg /d for  BP but she says she "can't tolerate 100mg  doses" and she feels it caused cramping all over- went to ER & decreased to 1/2 tab w/ improvement back to baseline;  Review of records indicates ER visit 11/03/11 w/ 2wk hx incr SOB/DOE, she had hypoxemia & placed on O2 at 1L/min, CXR & labs were neg, note> prev hx AB on Advair that she stopped on her own, weight down 10# on Nutrisys diet, they cut her Cozaar to 50mg  due to Creat of 1.3 on labs...  Now she feels she is back to baseline since Losartan back down to 50mg  dose, O2 sats are normal & ambulatory O2 monitor here today on RA showed sat= 97% RA at rest (HR=89), and after 3 laps sat=96% RA (HR=102), therefore we will have the  Oxygen picked up & she is pleased...  CXR 4/13 showed normal heart size, incr lung vols, clear x scarring left base, NAD... PFT 4/13 showed FVC= 2.45 (84%), FEV1= 1.83 (84%), %1sec= 75, mid-flows= 84% predicted... LABS 4/13:  Chems- ok x BUN=32 Creat=1.3;  CBC- ok w/ Hg=12.4   Problem List:    BRONCHITIS, RECURRENT (ICD-491.9) - she is a non-smoker... episode refractory AB finally resolved 4/10 w/ Ab's/ Pred/ Mucinex/ etc; prev on Advair she stopped on her own & just uses AlbutHFA inhaler as needed...  HYPERTENSION (ICD-401.9) - on COZAAR 50mg /d... ~  2DEcho 8/91 showed mild MR, trace AI, EF=60%... ~  NuclearStressTest 11/95 was normal...  ~  3/12:  BP= 120/70 & she says similar at home... takes med regularly and tol well... denies HA, fatigue, visual changes, CP, palipit, dizziness, syncope, dyspnea, edema, etc... ~  3/13:  BP= 150/84 & even better at home; she remains asymptomatic... ~  We attempted to incr the Losartan to 100mg /d but she states she can't tolerate a dose that high, went to ER w/ dyspnea, mild hypoxemia, & Creat=1.3; Cozaar reduced back to 50mg  & she states improved. ~  5/13:  BP= 158/72 but she says better at home & doesn't want additional meds...  CEREBROVASCULAR DISEASE (ICD-437.9) - on ASA 81mg /d... she has hx small vessel ischemic dis and has been seen by DrWillis for Neuro w/ hx amaurosis fugax in 2006... 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 ~  MRI Brain 2006 w/ sm vessel ischemic dis & 3mm intracavernous carotid aneurym- being followed... ~  CDopplers 3/08 showed bilat ICA plaque, 0-39% bilat stenoses (no change from prev). ~  CDopplers 3/09 showed 40-59% right ICA stenosis & 0-39% left ICA stenosis; she was asymptomatic... ~  CDopplers 3/10 showed mod plaque in prox ICAs bilat w/ 60-79% RICA & 0-39% LICA stenoses. ~  6/10: developed transient loss of vision in right eye- ophthal eval by DrMatthews & vasc eval DrLawson w/ repeat CDopplers showing  60-79% RICA stenosis, & 40-59% LICA stenosis ==> right RAE 6/10. ~  CDopplers followed by DrLawson's office since her surg & stable w/ patent right ICA endart site & 40-59% (low end) LICA stenosis felt to be stable & nonprogressive (done 7/11 & 8/12)...  HYPOTHYROIDISM (ICD-244.9) - on SYNTHROID 137mcg/d... feeling well, energy is good. ~  Labs 3/13 on Synth100 showed TSH= 0.92  GERD (ICD-530.81) - on PRILOSEC 20mg /d... GI=DrWeissman & last EGD 8/06 showed esoph stricture- dilated... rec for Prilosec daily...   DIVERTICULOSIS OF COLON (ICD-562.10) - she is s/p sigmoid colectomy 6/03 by DrHoxworth... ? if she has had a f/u colonoscopy... ~  3/13:  She never ret to [employer], now retired... We  will set her up to see DrKaplan for f/u colonoscopy...  DEGENERATIVE JOINT DISEASE (ICD-715.90) - know CSpine DJD w/ osteophytes on prev scan... she is s/p right TKR 1/08 by DrAlusio... ~  3/13:  She noted right hip pain & given shot for bursitis by DrAlusio...  BACK PAIN, LUMBAR (ICD-724.2) - she takes ALEVE & VICODIN as needed.  ANXIETY (ICD-300.00) DEPRESSION (ICD-311) - she takes cranberry extract for bladder stem problems- OK... not on anxiety or depression meds but under considerable stress w/ grandson's illness...   Past Surgical History  Procedure Date  . Vaginal hysterectomy   . Sigmoid colectomy   . Right tkr   . Tubal ligation   . Replacement total knee     right knee    Outpatient Encounter Prescriptions as of 12/18/2011  Medication Sig Dispense Refill  . albuterol (PROVENTIL HFA;VENTOLIN HFA) 108 (90 BASE) MCG/ACT inhaler Inhale 2 puffs into the lungs every 6 (six) hours as needed. For shortness of breath      . aspirin 81 MG tablet Take 81 mg by mouth daily.      . Calcium Carbonate-Vit D-Min 600-400 MG-UNIT TABS Take 2 tablets by mouth daily.       . diclofenac sodium (VOLTAREN) 1 % GEL Apply 1 application topically daily as needed. For pain      . estrogens, conjugated,  (PREMARIN) 0.9 MG tablet Take 0.9 mg by mouth daily. Take daily for 21 days then do not take for 7 days.      . fish oil-omega-3 fatty acids 1000 MG capsule Take 3 g by mouth daily.       Marland Kitchen HYDROcodone-acetaminophen (VICODIN) 5-500 MG per tablet Take 1 tablet by mouth every 6 (six) hours as needed. For pain      . levothyroxine (SYNTHROID, LEVOTHROID) 100 MCG tablet Take 1 tablet (100 mcg total) by mouth daily.  90 tablet  3  . losartan (COZAAR) 100 MG tablet daily. Take 1/2 tablet by mouth daily      . Misc Natural Products (OSTEO BI-FLEX ADV DOUBLE ST PO) Take 2 tablets by mouth daily.      Marland Kitchen omeprazole (PRILOSEC) 20 MG capsule Take 1 capsule (20 mg total) by mouth daily.  90 capsule  3  . Phenazopyridine HCl (AZO-STANDARD PO) Take 2 tablets by mouth every morning.      . vitamin B-12 (CYANOCOBALAMIN) 1000 MCG tablet Take 1,000 mcg by mouth daily.      Marland Kitchen DISCONTD: losartan (COZAAR) 100 MG tablet Take 1 tablet (100 mg total) by mouth daily.  90 tablet  3    Allergies  Allergen Reactions  . Relafen (Nabumetone)     unknown  . Penicillins Hives, Swelling and Rash    Current Medications, Allergies, Past Medical History, Past Surgical History, Family History, and Social History were reviewed in [employer] record.   Review of Systems        The patient complains of dyspnea on exertion, gas/bloating, urinary hesitancy, nocturia, back pain, joint pain, arthritis, difficulty walking, and depression.  The patient denies fever, chills, sweats, anorexia, fatigue, weakness, malaise, weight loss, sleep disorder, blurring, diplopia, eye irritation, eye discharge, vision loss, eye pain, photophobia, earache, ear discharge, tinnitus, decreased hearing, nasal congestion, nosebleeds, sore throat, hoarseness, chest pain, palpitations, syncope, orthopnea, PND, peripheral edema, cough, dyspnea at rest, excessive sputum, hemoptysis, wheezing, pleurisy, nausea, vomiting, diarrhea,  constipation, change in bowel habits, abdominal pain, melena, hematochezia, jaundice, indigestion/heartburn, dysphagia, odynophagia, dysuria, hematuria, urinary frequency,  incontinence, joint swelling, muscle cramps, muscle weakness, stiffness, sciatica, restless legs, leg pain at night, leg pain with exertion, rash, itching, dryness, suspicious lesions, paralysis, paresthesias, seizures, tremors, vertigo, transient blindness, frequent falls, frequent headaches, anxiety, memory loss, confusion, cold intolerance, heat intolerance, polydipsia, polyphagia, polyuria, unusual weight change, abnormal bruising, bleeding, enlarged lymph nodes, urticaria, allergic rash, hay fever, and recurrent infections.     Objective:   Physical Exam    WD, WN, 77 y/o WF in NAD... GENERAL:  Alert & oriented; pleasant & cooperative... HEENT:  [place]/AT, EOM-wnl, left marcus-gunn pupil, EACs-clear, TMs-wnl, NOSE-clear, THROAT-clear & wnl. NECK:  Supple w/ fairROM; no JVD; normal carotid impulses w/ faint right bruit; no thyromegaly or nodules palpated; no lymphadenopathy. CHEST:  Clear to P & A; without wheezes/ rales/ or rhonchi heard... HEART:  Regular Rhythm; without murmurs/ rubs/ or gallops detected... ABDOMEN:  Soft & nontender; normal bowel sounds; no organomegaly or masses palpated... EXT: without deformities, mild arthritic changes; no varicose veins/ +venous insuffic/ no edema. NEURO:  no focal neuro deficits x part loss of vision left eye... DERM:  No lesions noted; no rash etc...  RADIOLOGY DATA:  Reviewed in the EPIC EMR & discussed w/ the patient...    >>CXR 4/13 showed cardiomeg w/o CHF; COPD w/ scarring left base, NAD...  LABORATORY DATA:  Reviewed in the EPIC EMR & discussed w/ the patient...    >>LABS 3/13:  FLP- she has excellent HDL; Chems- wnl;  CBC- wnl w/ Hg=12.1;  TSH=0.92 on Synth100;  VitD=41   Assessment:     AB>  She uses the ProventilHFA as needed & hasn't needed; ok as long as she avoids  infections, refuses regular Advair dosing...  HBP>  Controlled on Losartan 50 & refuses the 100mg  dose; + diet,low sodium & needs to lose weight!  Cerebrovasc Dis>  Followed by [facility] & stable s/p right CAE in 2010; continue ASA & [facility] follow up...  Hypothyroid>  Stable on Synthroid 159mcg/d; continue same...  GERD>  Stable on PPI Rx daily; continue Prilosec...  Divertics>  S/p sigmoid colectomy 2003; she is in need of GI f/u & 79yr colonoscopy- refer to DrKaplan...  DJD>  S/p right TKR w/ known back pain & bursitis right hip- s/p shot per DrAlusio...  Anxiety/ Depression>  Not on meds and she feels she is doing satis & doesn't want med rx...     Plan:     Patient's Medications  New Prescriptions   No medications on file  Previous Medications   ALBUTEROL (PROVENTIL HFA;VENTOLIN HFA) 108 (90 BASE) MCG/ACT INHALER    Inhale 2 puffs into the lungs every 6 (six) hours as needed. For shortness of breath   ASPIRIN 81 MG TABLET    Take 81 mg by mouth daily.   CALCIUM CARBONATE-VIT D-MIN 600-400 MG-UNIT TABS    Take 2 tablets by mouth daily.    DICLOFENAC SODIUM (VOLTAREN) 1 % GEL    Apply 1 application topically daily as needed. For pain   ESTROGENS, CONJUGATED, (PREMARIN) 0.9 MG TABLET    Take 0.9 mg by mouth daily. Take daily for 21 days then do not take for 7 days.   FISH OIL-OMEGA-3 FATTY ACIDS 1000 MG CAPSULE    Take 3 g by mouth daily.    HYDROCODONE-ACETAMINOPHEN (VICODIN) 5-500 MG PER TABLET    Take 1 tablet by mouth every 6 (six) hours as needed. For pain   LEVOTHYROXINE (SYNTHROID, LEVOTHROID) 100 MCG TABLET    Take 1 tablet (100  mcg total) by mouth daily.   MISC NATURAL PRODUCTS (OSTEO BI-FLEX ADV DOUBLE ST PO)    Take 2 tablets by mouth daily.   OMEPRAZOLE (PRILOSEC) 20 MG CAPSULE    Take 1 capsule (20 mg total) by mouth daily.   PHENAZOPYRIDINE HCL (AZO-STANDARD PO)    Take 2 tablets by mouth every morning.   VITAMIN B-12 (CYANOCOBALAMIN) 1000 MCG TABLET    Take 1,000 mcg by  mouth daily.  Modified Medications   Modified Medication Previous Medication   LOSARTAN (COZAAR) 100 MG TABLET losartan (COZAAR) 100 MG tablet      daily. Take 1/2 tablet by mouth daily    Take 1 tablet (100 mg total) by mouth daily.  Discontinued Medications   No medications on file

## 2011-12-18 NOTE — Patient Instructions (Signed)
Today we updated your med list in our EPIC system...    Continue your current medications the same...  We will mark your record as INTOLERANT to Losartan 100mg  dose...  We will ask [facility] to pick up your Oxygen since you do not need it any longer...  Call for any questions or further problems.Marland KitchenMarland Kitchen

## 2011-12-31 ENCOUNTER — Encounter (INDEPENDENT_AMBULATORY_CARE_PROVIDER_SITE_OTHER): Payer: Medicare Other | Admitting: Ophthalmology

## 2012-01-09 ENCOUNTER — Encounter (INDEPENDENT_AMBULATORY_CARE_PROVIDER_SITE_OTHER): Payer: Medicare Other | Admitting: Ophthalmology

## 2012-01-09 DIAGNOSIS — H2510 Age-related nuclear cataract, unspecified eye: Secondary | ICD-10-CM | POA: Diagnosis not present

## 2012-01-09 DIAGNOSIS — H35039 Hypertensive retinopathy, unspecified eye: Secondary | ICD-10-CM

## 2012-01-09 DIAGNOSIS — H43819 Vitreous degeneration, unspecified eye: Secondary | ICD-10-CM | POA: Diagnosis not present

## 2012-01-09 DIAGNOSIS — I10 Essential (primary) hypertension: Secondary | ICD-10-CM

## 2012-01-09 DIAGNOSIS — H34239 Retinal artery branch occlusion, unspecified eye: Secondary | ICD-10-CM | POA: Diagnosis not present

## 2012-02-07 ENCOUNTER — Encounter (INDEPENDENT_AMBULATORY_CARE_PROVIDER_SITE_OTHER): Payer: Medicare Other | Admitting: Ophthalmology

## 2012-02-07 DIAGNOSIS — H40019 Open angle with borderline findings, low risk, unspecified eye: Secondary | ICD-10-CM

## 2012-02-07 DIAGNOSIS — H35039 Hypertensive retinopathy, unspecified eye: Secondary | ICD-10-CM | POA: Diagnosis not present

## 2012-02-07 DIAGNOSIS — I10 Essential (primary) hypertension: Secondary | ICD-10-CM | POA: Diagnosis not present

## 2012-03-05 ENCOUNTER — Encounter: Payer: Self-pay | Admitting: Neurosurgery

## 2012-03-06 ENCOUNTER — Ambulatory Visit (INDEPENDENT_AMBULATORY_CARE_PROVIDER_SITE_OTHER): Payer: Medicare Other | Admitting: *Deleted

## 2012-03-06 ENCOUNTER — Ambulatory Visit (INDEPENDENT_AMBULATORY_CARE_PROVIDER_SITE_OTHER): Payer: Medicare Other | Admitting: Neurosurgery

## 2012-03-06 ENCOUNTER — Encounter: Payer: Self-pay | Admitting: Neurosurgery

## 2012-03-06 VITALS — BP 133/60 | HR 59 | Resp 18 | Ht 65.0 in | Wt 190.0 lb

## 2012-03-06 DIAGNOSIS — Z48812 Encounter for surgical aftercare following surgery on the circulatory system: Secondary | ICD-10-CM

## 2012-03-06 DIAGNOSIS — I6529 Occlusion and stenosis of unspecified carotid artery: Secondary | ICD-10-CM | POA: Diagnosis not present

## 2012-03-06 NOTE — Progress Notes (Signed)
[facility] Carotid Office Note  CC: Annual carotid duplex Referring Physician: Hart Rochester  History of Present Illness: 77 year old female patient of Dr. Hart Rochester who status post right carotid endarterectomy in 2010. The patient denies signs or symptoms of CVA, TIA, amaurosis fugax or any neural deficit. The patient denies any new medical diagnoses or recent surgeries. The patient does state she would like to make an appointment with Dr. Hart Rochester in his vein clinic.  Past Medical History  Diagnosis Date  . History of sudden visual loss   . Acute cystitis   . Bronchitis, acute   . Hypertension   . Cerebrovascular disease   . Hypothyroidism   . GERD (gastroesophageal reflux disease)   . Diverticulosis of colon   . DJD (degenerative joint disease)   . Back pain   . Transient ischemic attack   . Anxiety   . Depression   . Asthma   . COPD (chronic obstructive pulmonary disease)     ROS: [x]  Positive   [ ]  Denies    General: [ ]  Weight loss, [ ]  Fever, [ ]  chills Neurologic: [ ]  Dizziness, [ ]  Blackouts, [ ]  Seizure [ ]  Stroke, [ ]  "Mini stroke", [ ]  Slurred speech, [ ]  Temporary blindness; [ ]  weakness in arms or legs, [ ]  Hoarseness Cardiac: [ ]  Chest pain/pressure, [ ]  Shortness of breath at rest [ ]  Shortness of breath with exertion, [ ]  Atrial fibrillation or irregular heartbeat Vascular: [ ]  Pain in legs with walking, [ ]  Pain in legs at rest, [ ]  Pain in legs at night,  [ ]  Non-healing ulcer, [ ]  Blood clot in vein/DVT,   Pulmonary: [ ]  Home oxygen, [ ]  Productive cough, [ ]  Coughing up blood, [ ]  Asthma,  [ ]  Wheezing Musculoskeletal:  [ ]  Arthritis, [ ]  Low back pain, [ ]  Joint pain Hematologic: [ ]  Easy Bruising, [ ]  Anemia; [ ]  Hepatitis Gastrointestinal: [ ]  Blood in stool, [ ]  Gastroesophageal Reflux/heartburn, [ ]  Trouble swallowing Urinary: [ ]  chronic Kidney disease, [ ]  on HD - [ ]  MWF or [ ]  TTHS, [ ]  Burning with urination, [ ]  Difficulty  urinating Skin: [ ]  Rashes, [ ]  Wounds Psychological: [ ]  Anxiety, [ ]  Depression   Social History History  Substance Use Topics  . Smoking status: Never Smoker   . Smokeless tobacco: Not on file  . Alcohol Use: No    Family History Family History  Problem Relation Age of Onset  . Emphysema Father   . Heart disease Mother   . Coronary artery disease Sister   . Heart disease Sister   . Rheumatic fever Brother     x2  . Heart attack Brother     Allergies  Allergen Reactions  . Losartan     To the 100mg  dose   . Relafen (Nabumetone)     unknown  . Penicillins Hives, Swelling and Rash    Current Outpatient Prescriptions  Medication Sig Dispense Refill  . albuterol (PROVENTIL HFA;VENTOLIN HFA) 108 (90 BASE) MCG/ACT inhaler Inhale 2 puffs into the lungs every 6 (six) hours as needed. For shortness of breath      . aspirin 81 MG tablet Take 81 mg by mouth daily.      . Calcium Carbonate-Vit D-Min 600-400 MG-UNIT TABS Take 2 tablets by mouth daily.       . diclofenac sodium (VOLTAREN) 1 % GEL Apply 1 application topically daily as needed. For  pain      . estrogens, conjugated, (PREMARIN) 0.9 MG tablet Take 0.9 mg by mouth daily. Take daily for 21 days then do not take for 7 days.      . fish oil-omega-3 fatty acids 1000 MG capsule Take 3 g by mouth daily.       Marland Kitchen HYDROcodone-acetaminophen (VICODIN) 5-500 MG per tablet Take 1 tablet by mouth every 6 (six) hours as needed. For pain      . levothyroxine (SYNTHROID, LEVOTHROID) 100 MCG tablet Take 1 tablet (100 mcg total) by mouth daily.  90 tablet  3  . losartan (COZAAR) 50 MG tablet Take 1 tablet (50 mg total) by mouth daily.  90 tablet  3  . Misc Natural Products (OSTEO BI-FLEX ADV DOUBLE ST PO) Take 2 tablets by mouth daily.      . Naproxen Sodium (ALEVE) 220 MG CAPS Take by mouth 2 (two) times daily.      Marland Kitchen omeprazole (PRILOSEC) 20 MG capsule Take 1 capsule (20 mg total) by mouth daily.  90 capsule  3  . vitamin B-12  (CYANOCOBALAMIN) 1000 MCG tablet Take 1,000 mcg by mouth daily.      . Phenazopyridine HCl (AZO-STANDARD PO) Take 2 tablets by mouth every morning.        Physical Examination  Filed Vitals:   03/06/12 1103  BP: 133/60  Pulse: 59  Resp:     Body mass index is 31.62 kg/(m^2).  General:  WDWN in NAD Gait: Normal HEENT: WNL Eyes: Pupils equal Pulmonary: normal non-labored breathing , without Rales, rhonchi,  wheezing Cardiac: RRR, without  Murmurs, rubs or gallops; Abdomen: soft, NT, no masses Skin: no rashes, ulcers noted  Vascular Exam Pulses: 3+ radial pulses bilaterally Carotid bruits: Carotid pulses to auscultation no bruits are heard Extremities without ischemic changes, no Gangrene , no cellulitis; no open wounds;  Musculoskeletal: no muscle wasting or atrophy   Neurologic: A&O X 3; Appropriate Affect ; SENSATION: normal; MOTOR FUNCTION:  moving all extremities equally. Speech is fluent/normal  Non-Invasive Vascular Imaging CAROTID DUPLEX 03/06/2012  Right ICA 0 - 19% stenosis Left ICA 20 - 39 % stenosis   ASSESSMENT/PLAN: Asymptomatic patient with minimal left internal carotid stenosis. The patient will followup in one year with repeat carotid duplex. The patient's questions were encouraged and answered, she is in agreement with this plan. The patient knows the signs and symptoms of CVA and knows to contact the nearest emergency department should that occur.  Lauree Chandler ANP   Clinic MD: Myra Gianotti on call

## 2012-03-10 ENCOUNTER — Encounter: Payer: Self-pay | Admitting: Neurosurgery

## 2012-03-10 NOTE — Addendum Note (Signed)
Addended by: MCCHESNEY, MARILYN K on: 03/10/2012 10:10 AM   Modules accepted: Orders  

## 2012-05-05 ENCOUNTER — Other Ambulatory Visit: Payer: Self-pay | Admitting: *Deleted

## 2012-05-05 DIAGNOSIS — I83893 Varicose veins of bilateral lower extremities with other complications: Secondary | ICD-10-CM

## 2012-06-16 ENCOUNTER — Encounter: Payer: Self-pay | Admitting: Vascular Surgery

## 2012-06-17 ENCOUNTER — Encounter: Payer: Medicare Other | Admitting: Vascular Surgery

## 2012-08-05 DIAGNOSIS — Z96659 Presence of unspecified artificial knee joint: Secondary | ICD-10-CM | POA: Diagnosis not present

## 2012-08-11 ENCOUNTER — Ambulatory Visit (INDEPENDENT_AMBULATORY_CARE_PROVIDER_SITE_OTHER): Payer: Medicare Other | Admitting: Ophthalmology

## 2012-08-11 DIAGNOSIS — H2510 Age-related nuclear cataract, unspecified eye: Secondary | ICD-10-CM

## 2012-08-11 DIAGNOSIS — H35039 Hypertensive retinopathy, unspecified eye: Secondary | ICD-10-CM

## 2012-08-11 DIAGNOSIS — H34239 Retinal artery branch occlusion, unspecified eye: Secondary | ICD-10-CM

## 2012-08-11 DIAGNOSIS — I10 Essential (primary) hypertension: Secondary | ICD-10-CM

## 2012-08-11 DIAGNOSIS — H43819 Vitreous degeneration, unspecified eye: Secondary | ICD-10-CM

## 2012-08-27 ENCOUNTER — Encounter: Payer: Self-pay | Admitting: Pulmonary Disease

## 2012-08-27 ENCOUNTER — Ambulatory Visit (INDEPENDENT_AMBULATORY_CARE_PROVIDER_SITE_OTHER): Payer: Medicare Other | Admitting: Pulmonary Disease

## 2012-08-27 VITALS — BP 124/80 | HR 68 | Temp 96.9°F | Ht 65.0 in | Wt 191.0 lb

## 2012-08-27 DIAGNOSIS — F411 Generalized anxiety disorder: Secondary | ICD-10-CM

## 2012-08-27 DIAGNOSIS — M25569 Pain in unspecified knee: Secondary | ICD-10-CM

## 2012-08-27 DIAGNOSIS — K219 Gastro-esophageal reflux disease without esophagitis: Secondary | ICD-10-CM

## 2012-08-27 DIAGNOSIS — K5730 Diverticulosis of large intestine without perforation or abscess without bleeding: Secondary | ICD-10-CM

## 2012-08-27 DIAGNOSIS — I10 Essential (primary) hypertension: Secondary | ICD-10-CM

## 2012-08-27 DIAGNOSIS — I679 Cerebrovascular disease, unspecified: Secondary | ICD-10-CM | POA: Diagnosis not present

## 2012-08-27 DIAGNOSIS — M545 Low back pain: Secondary | ICD-10-CM

## 2012-08-27 DIAGNOSIS — M1990 Unspecified osteoarthritis, unspecified site: Secondary | ICD-10-CM

## 2012-08-27 DIAGNOSIS — R0989 Other specified symptoms and signs involving the circulatory and respiratory systems: Secondary | ICD-10-CM | POA: Diagnosis not present

## 2012-08-27 DIAGNOSIS — E039 Hypothyroidism, unspecified: Secondary | ICD-10-CM

## 2012-08-27 DIAGNOSIS — R0609 Other forms of dyspnea: Secondary | ICD-10-CM

## 2012-08-27 DIAGNOSIS — M25562 Pain in left knee: Secondary | ICD-10-CM

## 2012-08-27 NOTE — Progress Notes (Signed)
Subjective:     Patient ID: [name], female   DOB: 05/05/1935, 78 y.o.   MRN: [id number]  HPI 78 y/o WF here for a follow up visit... she is followed both here and at the [place] where she gets her meds ([facility])... she has mult med problems including hx refractory AB requiring Advair, Ventolin, Mucinex;  Cerebrovas dis w/ right CAE 6/10 by DrLawson;  Hypothy on Synthroid;  GERD & Divertics w/ sigm colectomy 2003 for diverticulitis;  DJD w/ right TKR 2008 by DrAlusio; and Anxiety...  ~  October 09, 2010:  430mo ROV- she continues to get her meds at [facility] notes sl incr SOB/ DOE recently- hard for her to describe "I have the old asthma cough", no phlegm, no CP or cardiac symptoms;  we decided to recheck her CXR (COPD, mild scarring, cardiomeg, NAD) & PFT (more restricted than obstructed);  refill meds, discussed incr exerc program...    BP controlled on Losartan 50mg /d w/ BP= 130/82;  denies CP, palpit, edema, etc> and there are no cerebral ischemic symptoms (remains on ASA 81mg /d)...    Other medical problems remain stable>  Thyroid OK on Synth100 w/ TSH= 0.46;  GERD controlled w/ Prilosec & hx sigm colectomy for diverticulitis & ?when due for f/u colon (she will check w/ [facility] as DrWeissman has retired);  her GYN is Sports administrator & she tells me she saw DrKimbrough for UTIs but we don't have notes from him;  DJD & LBP followed by DrALusio (prev right TKR 2008);  still under incredible stress w/ grandsons critical illness (testic cancer surg then necrotizing fasciitis)...  ~  October 10, 2011:  Yearly ROV & [name] states that she is doing well w/o new complaints or concerns;  She wants refill prescriptions for [facility]- see meds below...    AB> she is NOT using the Advair, only the ProventilHFA as needed; no recent URIs or resp exac...    HBP> on Losartan50; BP= 150/84 & even better at home she says; denies HA, CP, palpit, SOB, edema, etc...    Cerebrovasc dis> on ASA daily; s/p right CAE 2010 by Windell Moulding; she  has yearly f/u w/ [facility] & last seen 8/12 w/ Carotid Duplex showing patent right CAE w/o restenosis, & 0-39% left ICA stenosis w/ antegrade vertebrals...    Hypothy> on Synthroid100; TSH=0.92 & she is clinically & biochemically euthyroid...    GI> GERD on Prilosec20/d, Hx divertics w/ sigmoid colectomy 2003 by DrHoxworth; prev followed by DrWeissman- ?last colon 2003? Referred to [facility] for on-going care...    DJD> she is s/p right TKR 2008 by DrAlusio; known DJD in spine, & bursitis in right hip w/ shot from DrAlusio; she uses Vicodin & osteo-biflex...    Anxiety/ Depression> not on anxiolytic meds but under stress w/ family issues; she declines med rx... LABS 3/13:  FLP- she has excellent HDL; Chems- wnl;  CBC- wnl w/ Hg=12.1;  TSH=0.92 on Synth100;  VitD=41  ~  Dec 18, 2011:  30mo ROV & post ER follow up> After last visit we increased her Losartan to 100mg /d for BP but she says she "can't tolerate 100mg  doses" and she feels it caused cramping all over- went to ER & decreased to 1/2 tab w/ improvement back to baseline;  Review of records indicates ER visit 11/03/11 w/ 2wk hx incr SOB/DOE, she had hypoxemia & placed on O2 at 1L/min, CXR & labs were neg, note> prev hx AB on Advair that she stopped on her own, weight down  10# on Nutris[employer] diet, they cut her Cozaar to 50mg  due to Creat of 1.3 on labs...  Now she feels she is back to baseline since Losartan back down to 50mg  dose, O2 sats are normal & ambulatory O2 monitor here today on RA showed sat= 97% RA at rest (HR=89), and after 3 laps sat=96% RA (HR=102), therefore we will have the Oxygen picked up & she is pleased...  CXR 4/13 showed normal heart size, incr lung vols, clear x scarring left base, NAD... PFT 4/13 showed FVC= 2.45 (84%), FEV1= 1.83 (84%), %1sec= 75, mid-flows= 84% predicted... LABS 4/13:  Chems- ok x BUN=32 Creat=1.3;  CBC- ok w/ Hg=12.4  ~  August 27, 2012:  108mo ROV & pre-op clearance for DrAlusio's planned left TKR> but she's not sure  she wants it & found a supplement off the internet "Alleviate" that she is very hopeful will help her knee & prevent her from needing surg (I told he OK to try the supplement but don't cancel the surg already sched for 4/14 w/ DrAlusio... We reviewed the following medical problems during today's office visit >>      AB> she is NOT using Advair, has ProventilHFA as needed; no recent URIs or resp exac but c/o DOE w/ any activity, she is way too sedentary/ no exercise at all; O2 sat on RA=99% and she was ambulated in office- 3laps w/ O2sat nadir=96%; advised to start regular exercise program thru the [facility] or similar...    HBP> on Losartan50; BP= 124/80 & similar at home she says; denies HA, CP, palpit, ch in SOB, edema, etc...    Cerebrovasc dis> on ASA daily; s/p right CAE 2010 by Windell Moulding; she has yearly f/u w/ [facility] & last seen 8/13 w/ Carotid Duplex showing patent right CAE w/o restenosis, & 0-39% left ICA stenosis w/ antegrade vertebrals (no change from last yr); she denies cerebral ischemic symptoms & continues f/u DrMatthews [facility] for stroke in right eye w/ visual impairment...    Hypothy> on Synthroid100; labs 3/13 showed TSH=0.92 & she is clinically & biochemically euthyroid...    GI> GERD on Prilosec20/d, Hx divertics w/ sigmoid colectomy 2003 by DrHoxworth; prev followed by DrWeissman- ?last colon 2003? Referred to DrKaplan for on-going care but she has yet to resched her colonoscopy & reminded to do so...    DJD> she is s/p right TKR 2008 by DrAlusio; known DJD in spine, & bursitis in right hip w/ shot from DrAlusio; she is bone on bone in the left knee & DrAlusio has sched TKR for 4/14- she wants to try supplement "Alleviate" first; she uses Vicodin & osteo-biflex as well; "I think I'm just getting old"...    Anxiety/ Depression> not on anxiolytic meds but under stress w/ family issues; she declines med rx...  We reviewed prob list, meds, xrays and labs> see below for updates >> OK for surgery...           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  BRONCHITIS, RECURRENT (ICD-491.9) - she is a non-smoker... episode refractory AB finally resolved 4/10 w/ Ab's/ Pred/ Mucinex/ etc; prev on Advair she stopped on her own & just uses AlbutHFA inhaler as needed...  HYPERTENSION (ICD-401.9) - on COZAAR 50mg /d... ~  2DEcho 8/91 showed mild MR, trace AI, EF=60%... ~  NuclearStressTest 11/95 was normal...  ~  3/12:  BP= 120/70 & she says similar at home... takes med regularly and tol well... denies HA, fatigue, visual changes, CP, palipit, dizziness, syncope, dyspnea, edema, etc... ~  3/13:  BP= 150/84 & even better at home; she remains asymptomatic... ~  We attempted to incr the Losartan to 100mg /d but she states she can't tolerate a dose that high, went to ER w/ dyspnea, mild hypoxemia, & Creat=1.3; Cozaar reduced back to 50mg  & she states improved. ~  5/13:  BP= 158/72 but she says better at home & doesn't want additional meds... ~  1/14:  on Losartan50; BP= 124/80 & similar at home she says; denies HA, CP, palpit, ch in SOB, edema, etc...  CEREBROVASCULAR DISEASE (ICD-437.9) - on ASA 81mg /d... she has hx small vessel ischemic dis and has been seen by DrWillis for Neuro w/ hx amaurosis fugax in 2006... 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 ~  MRI Brain 2006 w/ sm vessel ischemic dis & 3mm intracavernous carotid aneurym- being followed... ~  CDopplers 3/08 showed bilat ICA plaque, 0-39% bilat stenoses (no change from prev). ~  CDopplers 3/09 showed 40-59% right ICA stenosis & 0-39% left ICA stenosis; she was asymptomatic... ~  CDopplers 3/10 showed mod plaque in prox ICAs bilat w/ 60-79% RICA & 0-39% LICA stenoses. ~  6/10: developed transient loss of vision in right eye- ophthal eval by DrMatthews & vasc eval DrLawson w/ repeat CDopplers showing  60-79% RICA stenosis, & 40-59% LICA stenosis ==> right RAE 6/10. ~  CDopplers followed by DrLawson's office since her surg & stable w/ patent right ICA endart site & 40-59% (low end) LICA stenosis felt to be stable & nonprogressive (done 7/11 & 8/12)... ~  on ASA daily; seen 8/13 w/ Carotid Duplex showing patent right CAE w/o restenosis, & 0-39% left ICA stenosis w/ antegrade vertebrals (no change from last yr); she denies cerebral ischemic symptoms...  HYPOTHYROIDISM (ICD-244.9) - on SYNTHROID 19mcg/d... feeling well, energy is good. ~  Labs 3/13 on Synth100 showed TSH= 0.92  GERD (ICD-530.81) - on PRILOSEC 20mg /d... GI=DrWeissman & last EGD 8/06 showed esoph stricture- dilated... rec for Prilosec daily...   DIVERTICULOSIS OF COLON (ICD-562.10) - she is s/p sigmoid colectomy 6/03 by DrHoxworth... ? if she has had a f/u colonoscopy... ~  3/13:  She never ret to [employer], now retired... We will set her up to see DrKaplan for f/u colonoscopy... ~  1/14:  She has yet to sched the needed colonoscopy & reminded to do do...  DEGENERATIVE JOINT DISEASE (ICD-715.90) - know CSpine DJD w/ osteophytes on prev scan... she is s/p right TKR 1/08 by DrAlusio... ~  3/13:  She noted right hip pain & given shot for bursitis by DrAlusio... ~  1/14:  She is sched for left TKR w/ DrAlusio for 4/14; she wants to try "Alleviate" supplement 1st- ok...  BACK PAIN, LUMBAR (ICD-724.2) - she takes ALEVE & VICODIN as needed.  ANXIETY (ICD-300.00) DEPRESSION (ICD-311) - she takes cranberry extract for bladder stem problems- OK... not on anxiety or depression meds but under considerable stress w/ grandson's illness...   Past Surgical History  Procedure Date  . Vaginal hysterectomy   . Sigmoid colectomy   . Tubal ligation   . Replacement total knee     right knee  . Cardiothoracic procedure 01/13/2009    Right     Outpatient Encounter Prescriptions as of 08/27/2012  Medication Sig Dispense Refill  . albuterol  (PROVENTIL HFA;VENTOLIN HFA) 108 (90 BASE)  MCG/ACT inhaler Inhale 2 puffs into the lungs every 6 (six) hours as needed. For shortness of breath      . aspirin 81 MG tablet Take 81 mg by mouth daily.      . Calcium Carbonate-Vit D-Min 600-400 MG-UNIT TABS Take 2 tablets by mouth daily.       . diclofenac sodium (VOLTAREN) 1 % GEL Apply 1 application topically daily as needed. For pain      . estrogens, conjugated, (PREMARIN) 0.9 MG tablet Take 0.9 mg by mouth daily. Take daily for 21 days then do not take for 7 days.      . fish oil-omega-3 fatty acids 1000 MG capsule Take 3 g by mouth daily.       Marland Kitchen HYDROcodone-acetaminophen (VICODIN) 5-500 MG per tablet Take 1 tablet by mouth every 6 (six) hours as needed. For pain      . levothyroxine (SYNTHROID, LEVOTHROID) 100 MCG tablet Take 1 tablet (100 mcg total) by mouth daily.  90 tablet  3  . losartan (COZAAR) 50 MG tablet Take 1 tablet (50 mg total) by mouth daily.  90 tablet  3  . Misc Natural Products (OSTEO BI-FLEX ADV DOUBLE ST PO) Take 2 tablets by mouth daily.      . Naproxen Sodium (ALEVE) 220 MG CAPS Take by mouth 2 (two) times daily.      Marland Kitchen omeprazole (PRILOSEC) 20 MG capsule Take 1 capsule (20 mg total) by mouth daily.  90 capsule  3  . Phenazopyridine HCl (AZO-STANDARD PO) Take 2 tablets by mouth every morning.      . vitamin B-12 (CYANOCOBALAMIN) 1000 MCG tablet Take 1,000 mcg by mouth daily.        Allergies  Allergen Reactions  . Losartan     To the 100mg  dose   . Relafen (Nabumetone)     unknown  . Penicillins Hives, Swelling and Rash    Current Medications, Allergies, Past Medical History, Past Surgical History, Family History, and Social History were reviewed in [employer] record.   Review of Systems        The patient complains of dyspnea on exertion, gas/bloating, urinary hesitancy, nocturia, back pain, joint pain, arthritis, difficulty walking, and depression.  The patient denies fever, chills,  sweats, anorexia, fatigue, weakness, malaise, weight loss, sleep disorder, blurring, diplopia, eye irritation, eye discharge, vision loss, eye pain, photophobia, earache, ear discharge, tinnitus, decreased hearing, nasal congestion, nosebleeds, sore throat, hoarseness, chest pain, palpitations, syncope, orthopnea, PND, peripheral edema, cough, dyspnea at rest, excessive sputum, hemoptysis, wheezing, pleurisy, nausea, vomiting, diarrhea, constipation, change in bowel habits, abdominal pain, melena, hematochezia, jaundice, indigestion/heartburn, dysphagia, odynophagia, dysuria, hematuria, urinary frequency, incontinence, joint swelling, muscle cramps, muscle weakness, stiffness, sciatica, restless legs, leg pain at night, leg pain with exertion, rash, itching, dryness, suspicious lesions, paralysis, paresthesias, seizures, tremors, vertigo, transient blindness, frequent falls, frequent headaches, anxiety, memory loss, confusion, cold intolerance, heat intolerance, polydipsia, polyphagia, polyuria, unusual weight change, abnormal bruising, bleeding, enlarged lymph nodes, urticaria, allergic rash, hay fever, and recurrent infections.     Objective:   Physical Exam    WD, WN, 78 y/o WF in NAD... GENERAL:  Alert & oriented; pleasant & cooperative... HEENT:  [place]/AT, EOM-wnl, left marcus-gunn pupil, EACs-clear, TMs-wnl, NOSE-clear, THROAT-clear & wnl. NECK:  Supple w/ fairROM; no JVD; normal carotid impulses w/ faint right bruit; no thyromegaly or nodules palpated; no lymphadenopathy. CHEST:  Clear to P & A; without wheezes/ rales/ or rhonchi heard... HEART:  Regular  Rhythm; without murmurs/ rubs/ or gallops detected... ABDOMEN:  Soft & nontender; normal bowel sounds; no organomegaly or masses palpated... EXT: without deformities, mild arthritic changes; no varicose veins/ +venous insuffic/ no edema. NEURO:  no focal neuro deficits x part loss of vision left eye... DERM:  No lesions noted; no rash  etc...  RADIOLOGY DATA:  Reviewed in the EPIC EMR & discussed w/ the patient...    >>CXR 4/13 showed cardiomeg w/o CHF; COPD w/ scarring left base, NAD...  LABORATORY DATA:  Reviewed in the EPIC EMR & discussed w/ the patient...    >>LABS 3/13:  FLP- she has excellent HDL; Chems- wnl;  CBC- wnl w/ Hg=12.1;  TSH=0.92 on Synth100;  VitD=41   Assessment:      AB/ DOE>  She uses the ProventilHFA as needed & hasn't needed it often; she refuses regular Advair dosing; needs to avoid infections... Her DOE is from her sedentary lifestyle, asked to incr exercise & join the [facility]...  HBP>  Controlled on Losartan 50 & refuses the 100mg  dose of any medication; + diet- low sodium & needs to lose weight!  Cerebrovasc Dis>  Followed by [facility] & stable s/p right CAE in 2010; continue ASA & [facility]VS follow up...  Hypothyroid>  Stable on Synthroid 0.1mg /d; continue same...  GERD>  Stable on PPI Rx daily; continue Prilosec...  Divertics>  S/p sigmoid colectomy 2003; she is in need of GI f/u & 58yr colonoscopy- refer to DrKaplan...  DJD>  S/p right TKR w/ known back pain & bursitis right hip- s/p shot per DrAlusio; she is sched for left TKR 4/14- ok for surg...  Anxiety/ Depression>  Not on meds and she feels she is doing satis & doesn't want med rx...     Plan:     Patient's Medications  New Prescriptions   No medications on file  Previous Medications   ALBUTEROL (PROVENTIL HFA;VENTOLIN HFA) 108 (90 BASE) MCG/ACT INHALER    Inhale 2 puffs into the lungs every 6 (six) hours as needed. For shortness of breath   ASPIRIN 81 MG TABLET    Take 81 mg by mouth daily.   CALCIUM CARBONATE-VIT D-MIN 600-400 MG-UNIT TABS    Take 2 tablets by mouth daily.    DICLOFENAC SODIUM (VOLTAREN) 1 % GEL    Apply 1 application topically daily as needed. For pain   ESTROGENS, CONJUGATED, (PREMARIN) 0.9 MG TABLET    Take 0.9 mg by mouth daily. Take daily for 21 days then do not take for 7 days.   FISH OIL-OMEGA-3 FATTY ACIDS  1000 MG CAPSULE    Take 3 g by mouth daily.    HYDROCODONE-ACETAMINOPHEN (VICODIN) 5-500 MG PER TABLET    Take 1 tablet by mouth every 6 (six) hours as needed. For pain   LEVOTHYROXINE (SYNTHROID, LEVOTHROID) 100 MCG TABLET    Take 1 tablet (100 mcg total) by mouth daily.   LOSARTAN (COZAAR) 50 MG TABLET    Take 1 tablet (50 mg total) by mouth daily.   MISC NATURAL PRODUCTS (OSTEO BI-FLEX ADV DOUBLE ST PO)    Take 2 tablets by mouth daily.   NAPROXEN SODIUM (ALEVE) 220 MG CAPS    Take by mouth 2 (two) times daily.   OMEPRAZOLE (PRILOSEC) 20 MG CAPSULE    Take 1 capsule (20 mg total) by mouth daily.   PHENAZOPYRIDINE HCL (AZO-STANDARD PO)    Take 2 tablets by mouth every morning.   VITAMIN B-12 (CYANOCOBALAMIN) 1000 MCG TABLET  Take 1,000 mcg by mouth daily.  Modified Medications   No medications on file  Discontinued Medications   No medications on file

## 2012-08-27 NOTE — Patient Instructions (Addendum)
Today we updated your med list in our EPIC system...    Continue your current medications the same...   I have recommended an exercise program to help your mobility, breathing & stamina...    [facility]ining the [facility] is a great idea...  Good luck w/ the upcoming left knee replacement in April...  Call for any questions...  Let's plan a routine follow up visit w/ FASTING blood work in 6 months.Marland KitchenMarland Kitchen

## 2012-08-28 ENCOUNTER — Encounter: Payer: Self-pay | Admitting: Pulmonary Disease

## 2012-10-31 ENCOUNTER — Other Ambulatory Visit: Payer: Self-pay | Admitting: Orthopedic Surgery

## 2012-10-31 MED ORDER — BUPIVACAINE LIPOSOME 1.3 % IJ SUSP: 20.0000 mL | Freq: Once | INTRAMUSCULAR | Status: DC

## 2012-10-31 MED ORDER — DEXAMETHASONE SODIUM PHOSPHATE 10 MG/ML IJ SOLN: 10.0000 mg | Freq: Once | INTRAMUSCULAR | Status: DC

## 2012-10-31 NOTE — Progress Notes (Signed)
Preoperative surgical orders have been place into the Epic hospital system for [name] on 10/31/2012, 3:53 PM  by Patrica Duel for surgery on 11/17/2012.  Preop Total Knee orders including Experal, IV Tylenol, and IV Decadron as long as there are no contraindications to the above medications. [name], PA-C

## 2012-11-04 ENCOUNTER — Encounter (HOSPITAL_COMMUNITY): Payer: Self-pay | Admitting: Pharmacy Technician

## 2012-11-04 ENCOUNTER — Ambulatory Visit (INDEPENDENT_AMBULATORY_CARE_PROVIDER_SITE_OTHER): Payer: Medicare Other | Admitting: Cardiology

## 2012-11-04 ENCOUNTER — Encounter: Payer: Self-pay | Admitting: Cardiology

## 2012-11-04 VITALS — BP 161/74 | HR 66 | Ht 65.0 in | Wt 187.1 lb

## 2012-11-04 DIAGNOSIS — E7800 Pure hypercholesterolemia, unspecified: Secondary | ICD-10-CM

## 2012-11-04 DIAGNOSIS — I6529 Occlusion and stenosis of unspecified carotid artery: Secondary | ICD-10-CM | POA: Diagnosis not present

## 2012-11-04 DIAGNOSIS — R0609 Other forms of dyspnea: Secondary | ICD-10-CM

## 2012-11-04 DIAGNOSIS — I10 Essential (primary) hypertension: Secondary | ICD-10-CM | POA: Diagnosis not present

## 2012-11-04 DIAGNOSIS — R0989 Other specified symptoms and signs involving the circulatory and respiratory systems: Secondary | ICD-10-CM | POA: Diagnosis not present

## 2012-11-04 DIAGNOSIS — E785 Hyperlipidemia, unspecified: Secondary | ICD-10-CM | POA: Insufficient documentation

## 2012-11-04 MED ORDER — POTASSIUM CHLORIDE ER 10 MEQ PO TBCR: 10.0000 meq | EXTENDED_RELEASE_TABLET | Freq: Every day | ORAL | Status: DC

## 2012-11-04 MED ORDER — HYDROCHLOROTHIAZIDE 25 MG PO TABS: 25.0000 mg | ORAL_TABLET | Freq: Every day | ORAL | Status: DC

## 2012-11-04 MED ORDER — ATORVASTATIN CALCIUM 20 MG PO TABS: 20.0000 mg | ORAL_TABLET | Freq: Every day | ORAL | Status: DC

## 2012-11-04 NOTE — Patient Instructions (Addendum)
Start HCTZ(hydrochlorothiazide) 25mg  daily.   Start KCL(potassium) 10 mEq daily.   Start atorvastatin 20mg  daily.   Your physician has requested that you have an echocardiogram. Echocardiography is a painless test that uses sound waves to create images of your heart. It provides your doctor with information about the size and shape of your heart and how well your heart's chambers and valves are working. This procedure takes approximately one hour. There are no restrictions for this procedure.  Your physician has requested that you have a lexiscan myoview. For further information please visit [email or web address] Please follow instruction sheet, as given.  When you have your lab work prior to your surgery ask them to flag the results to Dr Shirlee Latch also.  Your physician recommends that you return for a FASTING lipid profile /liver profile in 2 months.  Your physician wants you to follow-up in: 1 year with Dr Shirlee Latch. (April 2015).  You will receive a reminder letter in the mail two months in advance. If you don't receive a letter, please call our office to schedule the follow-up appointment.

## 2012-11-04 NOTE — Progress Notes (Signed)
Patient ID: [name], female   DOB: [birth year], 78 y.o.   MRN: [id number] PCP: Dr. Kriste Basque  78 yo with long history of asthma with flares as well as carotid stenosis s/p CEA presents for cardiology evaluation.  She has had right TKR and now needs left TKR.  She wants her heart evaluated prior to the surgery as she is concerned about her chronic exertional dyspnea.  [name] has had exertional dyspnea that has been worse for > 1 year.  She does not feel like it is all attributable to her asthma.  At times, she will get short of breath just walking around her house.  However, sometimes she is also able to get out in the yard and work for up to an hour before giving out of breath.  She is short of breath walking up steps. No chest pain.  No PND or orthopnea.  She occasionally wheezes but gets short of breath at other times as well.  She is followed by Dr. Kriste Basque for her asthma.  Also of note, BP is significantly elevated today.  It has been high recently when she has checked it at pharmacies, etc.    ECG: NSR, normal  Labs (3/13): LDL 103, HDL 91 Labs (4/13): K 4.6, creatinine 1.29  PMH: 1. Carotid stenosis: Amaurosis fugax in 2010, then right CEA in 6/10 (follows with Hart Rochester).  2. Hypothyroidism 3. GERD 4. H/o diverticulitis with sigmoid colectomy in 2003.   5. OA with right TKR in 2008.  6. Asthma with flares 7. HTN 8. Negative stress test in 1995 9. Depression  SH: Married, never smoked, retired from [employer]  FH: Father with CABG in his 20s, mother with CAD.    ROS: All systems reviewed and negative except as per HPI.   Current Outpatient Prescriptions  Medication Sig Dispense Refill  . albuterol (PROVENTIL HFA;VENTOLIN HFA) 108 (90 BASE) MCG/ACT inhaler Inhale 2 puffs into the lungs every 6 (six) hours as needed. For shortness of breath      . aspirin 81 MG tablet Take 81 mg by mouth daily.      . Calcium Carbonate-Vit D-Min 600-400 MG-UNIT TABS Take 2 tablets by mouth daily.        . diclofenac sodium (VOLTAREN) 1 % GEL Apply 1 application topically daily as needed (pain). Apply to painful areas on knee      . estrogens, conjugated, (PREMARIN) 0.9 MG tablet Take 0.9 mg by mouth at bedtime.       . fish oil-omega-3 fatty acids 1000 MG capsule Take 2 g by mouth every morning.       Marland Kitchen HYDROcodone-acetaminophen (VICODIN) 5-500 MG per tablet Take 1 tablet by mouth every 6 (six) hours as needed. For pain      . Misc Natural Products (OSTEO BI-FLEX ADV DOUBLE ST PO) Take 2 tablets by mouth daily.      . Naproxen Sodium (ALEVE) 220 MG CAPS Take by mouth 2 (two) times daily.      Marland Kitchen omeprazole (PRILOSEC) 20 MG capsule Take 1 capsule (20 mg total) by mouth daily.  90 capsule  3  . atorvastatin (LIPITOR) 20 MG tablet Take 1 tablet (20 mg total) by mouth daily.  90 tablet  3  . bisacodyl (DULCOLAX) 5 MG EC tablet Take 5 mg by mouth daily as needed for constipation.      . Cranberry-Vitamin C-Probiotic (AZO CRANBERRY) 250-30 MG TABS Take 2 capsules by mouth daily.      Marland Kitchen  hydrochlorothiazide (HYDRODIURIL) 25 MG tablet Take 1 tablet (25 mg total) by mouth daily.  90 tablet  3  . levothyroxine (SYNTHROID, LEVOTHROID) 100 MCG tablet Take 100 mcg by mouth daily before breakfast.      . losartan (COZAAR) 50 MG tablet Take 50 mg by mouth daily before breakfast.      . potassium chloride (K-DUR) 10 MEQ tablet Take 1 tablet (10 mEq total) by mouth daily.  90 tablet  3   No current facility-administered medications for this visit.   BP 161/74  Pulse 66  Ht 5\' 5"  (1.651 m)  Wt 187 lb 1.9 oz (84.877 kg)  BMI 31.14 kg/m2  SpO2 96% General: NAD, overweight Neck: No JVD, no thyromegaly or thyroid nodule.  Lungs: Clear but somewhat prolonged expiratory phase.  CV: Nondisplaced PMI.  Heart regular S1/S2, no S3/S4, no murmur.  No peripheral edema.  No carotid bruit.  Normal pedal pulses.  Abdomen: Soft, nontender, no hepatosplenomegaly, no distention.  Skin: Intact without lesions or rashes.   Neurologic: Alert and oriented x 3.  Psych: Normal affect. Extremities: No clubbing or cyanosis.  HEENT: Normal.   Assessment/Plan: 1. Exertional dyspnea: This has been chronic but worse over the last 6+ months.  She has known vascular disease (carotid stenosis).  Other risk factors include HTN and family history of CAD.  She is going to undergo left TKR.  Given her exertional symptoms and risk factors, I think that it would be reasonable to get a Lexiscan Sestamibi prior to surgery.  She will continue ASA 81 mg daily.  2. Hyperlipidemia: She has known vascular disease (carotid stenosis).  She needs to be on a statin.  I will start her on atorvastatin 20 mg daily and will get lipids/LFTs in 2 months.  3. HTN: BP is running high.  I will start her on HCTZ 25 mg daily with KCl 10 mEq daily.  4. Carotid stenosis s/p CEA: Follows with Dr. Hart Rochester.   [name] 11/04/2012 5:10 PM

## 2012-11-05 ENCOUNTER — Other Ambulatory Visit: Payer: Self-pay

## 2012-11-05 ENCOUNTER — Ambulatory Visit (HOSPITAL_COMMUNITY): Payer: Medicare Other | Attending: Cardiovascular Disease | Admitting: Radiology

## 2012-11-05 DIAGNOSIS — E7800 Pure hypercholesterolemia, unspecified: Secondary | ICD-10-CM

## 2012-11-05 DIAGNOSIS — R0989 Other specified symptoms and signs involving the circulatory and respiratory systems: Secondary | ICD-10-CM | POA: Diagnosis not present

## 2012-11-05 DIAGNOSIS — R0602 Shortness of breath: Secondary | ICD-10-CM | POA: Insufficient documentation

## 2012-11-05 DIAGNOSIS — R0609 Other forms of dyspnea: Secondary | ICD-10-CM

## 2012-11-05 NOTE — Progress Notes (Signed)
Echocardiogram performed.  

## 2012-11-07 NOTE — Patient Instructions (Signed)
Christiona A Sapien  11/07/2012   Your procedure is scheduled on:  11/17/12   Report to [facility] at     1045 AM.  Call this number if you have problems the morning of surgery: [phone]   Remember:   Do not eat food after midnite.  May have clear liquids until 0630am then npo.     Take these medicines the morning of surgery with A SIP OF WATER:    Do not wear jewelry, make-up or nail polish.  Do not wear lotions, powders, or perfumes  Do not shave 48 hours prior to surgery.   Do not bring valuables to the hospital.  Contacts, dentures or bridgework may not be worn into surgery.  Leave suitcase in the car. After surgery it may be brought to your room.  For patients admitted to the hospital, checkout time is 11:00 AM the day of  discharge.       SEE CHG INSTRUCTION SHEET    Please read over the following fact sheets that you were given: MRSA Information, coughing and deep breathing exercises, leg exercises, Blood Transfusion Fact sheet, Incentive Spirometry Fact Sheet                Failure to comply with these instructions may result in cancellation of your surgery.                Patient Signature ____________________________              Nurse Signature _____________________________

## 2012-11-10 ENCOUNTER — Encounter (HOSPITAL_COMMUNITY)
Admission: RE | Admit: 2012-11-10 | Discharge: 2012-11-10 | Disposition: A | Discharge status: Home / Self Care | Payer: Medicare Other | Source: Ambulatory Visit | Attending: Orthopedic Surgery | Admitting: Orthopedic Surgery

## 2012-11-10 ENCOUNTER — Ambulatory Visit (HOSPITAL_COMMUNITY)
Admission: RE | Admit: 2012-11-10 | Discharge: 2012-11-10 | Disposition: A | Discharge status: Home / Self Care | Payer: Medicare Other | Source: Ambulatory Visit | Attending: Orthopedic Surgery | Admitting: Orthopedic Surgery

## 2012-11-10 ENCOUNTER — Encounter (HOSPITAL_COMMUNITY): Payer: Self-pay

## 2012-11-10 DIAGNOSIS — I10 Essential (primary) hypertension: Secondary | ICD-10-CM | POA: Diagnosis not present

## 2012-11-10 DIAGNOSIS — Z01818 Encounter for other preprocedural examination: Secondary | ICD-10-CM | POA: Diagnosis not present

## 2012-11-10 DIAGNOSIS — R0602 Shortness of breath: Secondary | ICD-10-CM | POA: Diagnosis not present

## 2012-11-10 DIAGNOSIS — Z01812 Encounter for preprocedural laboratory examination: Secondary | ICD-10-CM | POA: Diagnosis not present

## 2012-11-10 DIAGNOSIS — J984 Other disorders of lung: Secondary | ICD-10-CM | POA: Insufficient documentation

## 2012-11-10 DIAGNOSIS — M1710 Unilateral primary osteoarthritis, unspecified knee: Secondary | ICD-10-CM | POA: Insufficient documentation

## 2012-11-10 HISTORY — DX: Cerebral infarction, unspecified: I63.9

## 2012-11-10 HISTORY — DX: Pneumonia, unspecified organism: J18.9

## 2012-11-10 HISTORY — DX: Shortness of breath: R06.02

## 2012-11-10 HISTORY — DX: Urinary tract infection, site not specified: N39.0

## 2012-11-10 LAB — URINALYSIS, ROUTINE W REFLEX MICROSCOPIC
Bilirubin Urine: NEGATIVE
Hgb urine dipstick: NEGATIVE
Ketones, ur: NEGATIVE mg/dL
Nitrite: POSITIVE — AB
Urobilinogen, UA: 0.2 mg/dL (ref 0.0–1.0)
pH: 5.5 (ref 5.0–8.0)

## 2012-11-10 LAB — PROTIME-INR
INR: 0.91 (ref 0.00–1.49)
Prothrombin Time: 12.2 seconds (ref 11.6–15.2)

## 2012-11-10 LAB — COMPREHENSIVE METABOLIC PANEL
ALT: 6 U/L (ref 0–35)
Albumin: 3.1 g/dL — ABNORMAL LOW (ref 3.5–5.2)
Alkaline Phosphatase: 61 U/L (ref 39–117)
BUN: 15 mg/dL (ref 6–23)
Chloride: 103 mEq/L (ref 96–112)
Creatinine, Ser: 0.95 mg/dL (ref 0.50–1.10)
Glucose, Bld: 98 mg/dL (ref 70–99)
Potassium: 4.1 mEq/L (ref 3.5–5.1)
Total Protein: 7.4 g/dL (ref 6.0–8.3)

## 2012-11-10 LAB — CBC
HCT: 35.6 % — ABNORMAL LOW (ref 36.0–46.0)
MCH: 31.1 pg (ref 26.0–34.0)
MCHC: 33.7 g/dL (ref 30.0–36.0)
MCV: 92.2 fL (ref 78.0–100.0)
Platelets: 327 10*3/uL (ref 150–400)
RDW: 13.6 % (ref 11.5–15.5)

## 2012-11-10 LAB — APTT: aPTT: 28 seconds (ref 24–37)

## 2012-11-10 LAB — URINE MICROSCOPIC-ADD ON

## 2012-11-10 NOTE — Progress Notes (Signed)
Echo 11/05/12 epic  03/10/12 CAROTID DOPPLERS epic  Last office visit with Dr Shirlee Latch 11/04/12 EPIC  To have Stress Test 11/12/12

## 2012-11-12 ENCOUNTER — Encounter (HOSPITAL_COMMUNITY): Payer: Medicare Other

## 2012-11-12 LAB — URINE CULTURE: Colony Count: >=100000

## 2012-11-13 ENCOUNTER — Ambulatory Visit (HOSPITAL_COMMUNITY): Payer: Medicare Other | Attending: Cardiology | Admitting: Radiology

## 2012-11-13 ENCOUNTER — Telehealth: Payer: Self-pay | Admitting: Cardiology

## 2012-11-13 VITALS — BP 158/89 | Ht 65.0 in | Wt 182.0 lb

## 2012-11-13 DIAGNOSIS — Z8673 Personal history of transient ischemic attack (TIA), and cerebral infarction without residual deficits: Secondary | ICD-10-CM | POA: Diagnosis not present

## 2012-11-13 DIAGNOSIS — Z8249 Family history of ischemic heart disease and other diseases of the circulatory system: Secondary | ICD-10-CM | POA: Diagnosis not present

## 2012-11-13 DIAGNOSIS — I10 Essential (primary) hypertension: Secondary | ICD-10-CM | POA: Insufficient documentation

## 2012-11-13 DIAGNOSIS — R002 Palpitations: Secondary | ICD-10-CM | POA: Insufficient documentation

## 2012-11-13 DIAGNOSIS — I779 Disorder of arteries and arterioles, unspecified: Secondary | ICD-10-CM | POA: Insufficient documentation

## 2012-11-13 DIAGNOSIS — R0609 Other forms of dyspnea: Secondary | ICD-10-CM | POA: Diagnosis not present

## 2012-11-13 DIAGNOSIS — R0989 Other specified symptoms and signs involving the circulatory and respiratory systems: Secondary | ICD-10-CM | POA: Insufficient documentation

## 2012-11-13 DIAGNOSIS — Z01810 Encounter for preprocedural cardiovascular examination: Secondary | ICD-10-CM | POA: Diagnosis not present

## 2012-11-13 DIAGNOSIS — R0602 Shortness of breath: Secondary | ICD-10-CM | POA: Diagnosis not present

## 2012-11-13 DIAGNOSIS — E7800 Pure hypercholesterolemia, unspecified: Secondary | ICD-10-CM

## 2012-11-13 MED ORDER — TECHNETIUM TC 99M SESTAMIBI GENERIC - CARDIOLITE
33.0000 | Freq: Once | INTRAVENOUS | Status: AC | PRN
  Administered 2012-11-13: 33 via INTRAVENOUS

## 2012-11-13 MED ORDER — TECHNETIUM TC 99M SESTAMIBI GENERIC - CARDIOLITE
11.0000 | Freq: Once | INTRAVENOUS | Status: AC | PRN
  Administered 2012-11-13: 11 via INTRAVENOUS

## 2012-11-13 MED ORDER — REGADENOSON 0.4 MG/5ML IV SOLN
0.4000 mg | Freq: Once | INTRAVENOUS | Status: AC
  Administered 2012-11-13: 0.4 mg via INTRAVENOUS

## 2012-11-13 NOTE — Progress Notes (Addendum)
[facility] SITE 3 NUCLEAR MED [address] [place], [place] [address] [phone]    Cardiology Nuclear Med Study  [name] is a 78 y.o. female     MRN : [id number]     DOB: [birth year]  Procedure Date: 11/13/2012  Nuclear Med Background Indication for Stress Test:  Evaluation for Ischemia and Pending Surgical Clearance: TKR 11/17/12- Dr. Ollen Gross History:  2014 Echo EF 60-65% 2004 MPS EF 71% Normal Cardiac Risk Factors: Carotid Disease, Family History - CAD, Hypertension and TIA  Symptoms:  DOE, Palpitations and SOB   Nuclear Pre-Procedure Caffeine/Decaff Intake:  None NPO After: 8:30am   Lungs:  clear O2 Sat: 95% on room air. IV 0.9% NS with Angio Cath:  20g  IV Site: R Antecubital  IV Started by:  Stanton Kidney, EMT-P  Chest Size (in):  36 Cup Size: DD  Height: 5\' 5"  (1.651 m)  Weight:  182 lb (82.555 kg)  BMI:  Body mass index is 30.29 kg/(m^2). Tech Comments:  NA    Nuclear Med Study 1 or 2 day study: 1 day  Stress Test Type:  Lexiscan  Reading MD: Marca Ancona, MD  Order Authorizing Provider:  Marca Ancona, MD  Resting Radionuclide: Technetium 23m Sestamibi  Resting Radionuclide Dose: 10.8 mCi   Stress Radionuclide:  Technetium 60m Sestamibi  Stress Radionuclide Dose: 33.0 mCi           Stress Protocol Rest HR: 84 Stress HR: 131  Rest BP: 158/89 Stress BP: 192/69  Exercise Time (min): n/a METS: n/a   Predicted Max HR: 145 bpm % Max HR: 90.34 bpm Rate Pressure Product: 25956   Dose of Adenosine (mg):  n/a Dose of Lexiscan: 0.4 mg  Dose of Atropine (mg): n/a Dose of Dobutamine: n/a mcg/kg/min (at max HR)  Stress Test Technologist: Bonnita Levan, RN  Nuclear Technologist:  Doyne Keel, CNMT     Rest Procedure:  Myocardial perfusion imaging was performed at rest 45 minutes following the intravenous administration of Technetium 51m Sestamibi. Rest ECG: NSR - Normal EKG  Stress Procedure:  The patient received IV Lexiscan 0.4 mg over  15-seconds.  Technetium 44m Sestamibi injected at 30-seconds.  Quantitative spect images were obtained after a 45 minute delay. Stress ECG: No significant change from baseline ECG  QPS Raw Data Images:  Normal; no motion artifact; normal heart/lung ratio. Stress Images:  Normal homogeneous uptake in all areas of the myocardium. Rest Images:  Normal homogeneous uptake in all areas of the myocardium. Subtraction (SDS):  There is no evidence of scar or ischemia. Transient Ischemic Dilatation (Normal <1.22):  1.16 Lung/Heart Ratio (Normal <0.45):  0.39  Quantitative Gated Spect Images QGS EDV:  NA QGS ESV:  NA  Impression Exercise Capacity:  Lexiscan with no exercise. BP Response:  Hypertensive blood pressure response. Clinical Symptoms:  Fatigue.  ECG Impression:  No significant ST segment change suggestive of ischemia. Comparison with Prior Nuclear Study: No images to compare  Overall Impression:  Normal stress nuclear study.  LV Ejection Fraction: Study not gated.  LV Wall Motion:  NA  Marca Ancona 11/13/2012  Normal study but hypertensive BP response.   Marca Ancona 11/18/2012

## 2012-11-13 NOTE — Telephone Encounter (Signed)
Walk in pt Form " Pt Needs Handicaped Pla-Card"  Pt Did Not Leave Handicapped paperwork I called & spoke With Her Husband and Let them know If they go to the [facility] they can Pick up the paper for the Sticker Bring it here For Dr.McLean to Sign. I did Not send this Walk in pt Form around because there was paper attached, i called  Patient myself. 11/13/12/KM

## 2012-11-15 ENCOUNTER — Other Ambulatory Visit: Payer: Self-pay | Admitting: Orthopedic Surgery

## 2012-11-15 NOTE — H&P (Signed)
[name]  DOB: 26-Mar-1935 Married / Language: English / Race: White Female  Date of Admission:  11/17/2012  Chief Complaint:  Left Knee Pain  History of Present Illness The patient is a 78 year old female who comes in for a preoperative History and Physical. The patient is scheduled for a left total knee arthroplasty to be performed by Dr. Gus Rankin. Aluisio, MD at [facility] on 11/17/2012. The patient comes in today 6 years out from right total knee arthroplasty. The patient states that she is doing very well at this time. The right knee is doing great but the left knee is getting progressively worse over time. The left knee is as bad as the right one was prior to her surgery there. She is at a stage where she can not get around like she would like to. She also has pulmonary issues and has required oxygen intermittently for activities. At this stage she said the left knee is bothering her bad enough where she would like to get it fixed if possible. She has had cortisone injections in the past. They have not provided any long term benefit. She is ready to get the left one fixed. They have been treated conservatively in the past for the above stated problem and despite conservative measures, they continue to have progressive pain and severe functional limitations and dysfunction. They have failed non-operative management including home exercise, medications, and injections. It is felt that they would benefit from undergoing total joint replacement. Risks and benefits of the procedure have been discussed with the patient and they elect to proceed with surgery. There are no active contraindications to surgery such as ongoing infection or rapidly progressive neurological disease.    Problem List S/P knee replacement (V43.65) Primary osteoarthritis of one knee (715.16)   Allergies Penicillins. Hives. Relafen *ANALGESICS - ANTI-INFLAMMATORY* Oruvail *ANALGESICS -  ANTI-INFLAMMATORY*   Family History Diabetes Mellitus. mother Congestive Heart Failure. mother and brother Heart disease in female family member before age 27 Drug / Alcohol Addiction. brother Cerebrovascular Accident. sister Mother. Deceased. Congestive Heart Failure Father. Deceased. Emphysema   Social History Number of flights of stairs before winded. 2-3 Marital status. married Tobacco / smoke exposure. yes outdoors only Pain Contract. no Living situation. live with spouse Drug/Alcohol Rehab (Previously). no Drug/Alcohol Rehab (Currently). no Illicit drug use. no Exercise. Exercises never Tobacco use. never smoker Current work status. retired Copywriter, advertising. 3 Alcohol use. never consumed alcohol   Medication History Osteo Bi-Flex Triple Strength ( Oral) Active. Synthroid ( Tablet, Oral) Active. Cozaar (50MG  Tablet, Oral) Active. Aleve (220MG  Capsule, Oral) Active. Aspirin EC (81MG  Tablet DR, Oral) Active. Vitamin B12 TR ( Oral) Specific dose unknown - Active. Fish Oil Active. Calcium "900" w/D ( Oral) Active. Premarin ( Oral) Specific dose unknown - Active.   Past Surgical History Colectomy. partial Arthroscopy of Knee. right Total Knee Replacement. right Hysterectomy. partial (non-cancerous) Carotid Artery Surgery - Right. Endarterectomy Tubal Ligation Morton's Neuroma Surgery Right Foot   Medical History Hiatal Hernia Hypercholesterolemia High blood pressure Gastroesophageal Reflux Disease Cerebrovascular Accident. Right Ocular Stoke - Partial Blindness Asthma Diverticulitis Of Colon Chronic Cystitis Impaired Vision. Partial Blindness Right Eye Carotid Artery Stenosis. Mild on Left   Review of Systems General:Not Present- Chills, Fever, Night Sweats, Fatigue, Weight Gain, Weight Loss and Memory Loss. Skin:Not Present- Hives, Itching, Rash, Eczema and Lesions. HEENT:Not Present- Tinnitus, Headache, Double  Vision, Visual Loss, Hearing Loss and Dentures. Respiratory:Present- Shortness of breath with exertion. Not Present- Shortness of  breath at rest, Allergies, Coughing up blood and Chronic Cough. Cardiovascular:Not Present- Chest Pain, Racing/skipping heartbeats, Difficulty Breathing Lying Down, Murmur, Swelling and Palpitations. Gastrointestinal:Not Present- Bloody Stool, Heartburn, Abdominal Pain, Vomiting, Nausea, Constipation, Diarrhea, Difficulty Swallowing, Jaundice and Loss of appetitie. Female Genitourinary:Present- Urinary frequency and Urinating at Night. Not Present- Blood in Urine, Weak urinary stream, Discharge, Flank Pain, Incontinence, Painful Urination, Urgency and Urinary Retention. Musculoskeletal:Present- Joint Pain. Not Present- Muscle Weakness, Muscle Pain, Joint Swelling, Back Pain, Morning Stiffness and Spasms. Neurological:Not Present- Tremor, Dizziness, Blackout spells, Paralysis, Difficulty with balance and Weakness. Psychiatric:Not Present- Insomnia.   Vitals Weight: 185 lb Height: 65 in Body Surface Area: 1.96 m Body Mass Index: 30.79 kg/m Pulse: 76 (Regular) Resp.: 14 (Unlabored) BP: 142/88 (Sitting, Left Arm, Standard)    Physical Exam The physical exam findings are as follows:  Note: Patient is a 78 year old female with continued left knee pain.   General Mental Status - Alert, cooperative and good historian. General Appearance- pleasant. Not in acute distress. Orientation- Oriented X3. Build & Nutrition- Well nourished and Well developed.   Head and Neck Head- normocephalic, atraumatic . Neck Global Assessment- supple. no bruit auscultated on the right and no bruit auscultated on the left. Note: Previous right carotid endarterectomy incision noted.   Eye Pupil- Bilateral- Regular and Round. Motion- Bilateral- EOMI.   Chest and Lung Exam Auscultation: Breath sounds:- clear at anterior chest wall and -  clear at posterior chest wall. Adventitious sounds:Inspiratory wheeze- Both Lung Fields (Right more so than Left), Right Lower Lobe (Anterior), Right Middle Lobe (Anterior) and Left Upper Lobe (Anterior).   Cardiovascular Auscultation:Rhythm- Regular rate and rhythm. Heart Sounds- S1 WNL and S2 WNL. Murmurs & Other Heart Sounds:Auscultation of the heart reveals - No Murmurs.   Abdomen Palpation/Percussion:Tenderness- Abdomen is non-tender to palpation. Rigidity (guarding)- Abdomen is soft. Auscultation:Auscultation of the abdomen reveals - Bowel sounds normal.   Female Genitourinary Not done, not pertinent to present illness  Musculoskeletal Well developed female alert and oriented in no apparent distress. Both hips show normal range of motion with no discomfort. Her right knee shows no swelling. Range of motion 0 to 125 degrees. There is no tenderness or instability in the right knee. The left knee varus deformity, range 5 to 120, marked crepitus on range of motion, tenderness medial greater than lateral with no instability.  RADIOGRAPHS: AP and lateral of both knees show prosthesis on the right in excellent position, no periprosthetic abnormalities. On the left she is bone on bone arthritis medial and patellofemoral compartments.  Assessment & Plan Primary osteoarthritis of one knee (715.16) Impression: Left Knee  Note: Plan is for a Left Total Knee Replacement by Dr. Lequita Halt.  Plan is to go home.  Signed electronically by Roberts Gaudy, PA-C

## 2012-11-17 ENCOUNTER — Inpatient Hospital Stay (HOSPITAL_COMMUNITY): Payer: Medicare Other | Admitting: Anesthesiology

## 2012-11-17 ENCOUNTER — Inpatient Hospital Stay (HOSPITAL_COMMUNITY)
Admission: RE | Admit: 2012-11-17 | Discharge: 2012-11-19 | DRG: 470 | Disposition: A | Discharge status: Home Health | Payer: Medicare Other | Source: Ambulatory Visit | Attending: Orthopedic Surgery | Admitting: Orthopedic Surgery

## 2012-11-17 ENCOUNTER — Encounter (HOSPITAL_COMMUNITY): Payer: Self-pay | Admitting: Anesthesiology

## 2012-11-17 ENCOUNTER — Encounter (HOSPITAL_COMMUNITY)
Admission: RE | Disposition: A | Discharge status: Home Health | Payer: Self-pay | Source: Ambulatory Visit | Attending: Orthopedic Surgery

## 2012-11-17 ENCOUNTER — Encounter (HOSPITAL_COMMUNITY): Payer: Self-pay | Admitting: *Deleted

## 2012-11-17 DIAGNOSIS — F3289 Other specified depressive episodes: Secondary | ICD-10-CM | POA: Diagnosis present

## 2012-11-17 DIAGNOSIS — H5440 Blindness, one eye, unspecified eye: Secondary | ICD-10-CM | POA: Diagnosis present

## 2012-11-17 DIAGNOSIS — J4489 Other specified chronic obstructive pulmonary disease: Secondary | ICD-10-CM | POA: Diagnosis present

## 2012-11-17 DIAGNOSIS — J449 Chronic obstructive pulmonary disease, unspecified: Secondary | ICD-10-CM | POA: Diagnosis present

## 2012-11-17 DIAGNOSIS — M179 Osteoarthritis of knee, unspecified: Secondary | ICD-10-CM | POA: Diagnosis present

## 2012-11-17 DIAGNOSIS — E7800 Pure hypercholesterolemia, unspecified: Secondary | ICD-10-CM | POA: Diagnosis present

## 2012-11-17 DIAGNOSIS — R0602 Shortness of breath: Secondary | ICD-10-CM | POA: Diagnosis not present

## 2012-11-17 DIAGNOSIS — M1710 Unilateral primary osteoarthritis, unspecified knee: Secondary | ICD-10-CM | POA: Diagnosis not present

## 2012-11-17 DIAGNOSIS — Z96659 Presence of unspecified artificial knee joint: Secondary | ICD-10-CM | POA: Diagnosis not present

## 2012-11-17 DIAGNOSIS — K219 Gastro-esophageal reflux disease without esophagitis: Secondary | ICD-10-CM | POA: Diagnosis present

## 2012-11-17 DIAGNOSIS — I69998 Other sequelae following unspecified cerebrovascular disease: Secondary | ICD-10-CM

## 2012-11-17 DIAGNOSIS — I10 Essential (primary) hypertension: Secondary | ICD-10-CM | POA: Diagnosis present

## 2012-11-17 DIAGNOSIS — F329 Major depressive disorder, single episode, unspecified: Secondary | ICD-10-CM | POA: Diagnosis present

## 2012-11-17 DIAGNOSIS — F411 Generalized anxiety disorder: Secondary | ICD-10-CM | POA: Diagnosis present

## 2012-11-17 DIAGNOSIS — Z880 Allergy status to penicillin: Secondary | ICD-10-CM

## 2012-11-17 DIAGNOSIS — Z9049 Acquired absence of other specified parts of digestive tract: Secondary | ICD-10-CM

## 2012-11-17 DIAGNOSIS — M549 Dorsalgia, unspecified: Secondary | ICD-10-CM | POA: Diagnosis not present

## 2012-11-17 DIAGNOSIS — E039 Hypothyroidism, unspecified: Secondary | ICD-10-CM | POA: Diagnosis present

## 2012-11-17 DIAGNOSIS — IMO0002 Reserved for concepts with insufficient information to code with codable children: Secondary | ICD-10-CM | POA: Diagnosis not present

## 2012-11-17 HISTORY — PX: TOTAL KNEE ARTHROPLASTY: SHX125

## 2012-11-17 LAB — TYPE AND SCREEN: Antibody Screen: NEGATIVE

## 2012-11-17 SURGERY — ARTHROPLASTY, KNEE, TOTAL
Anesthesia: General | Site: Knee | Laterality: Left | Wound class: Clean

## 2012-11-17 MED ORDER — OXYCODONE HCL 5 MG PO TABS: 5.0000 mg | ORAL_TABLET | Freq: Once | ORAL | Status: DC | PRN

## 2012-11-17 MED ORDER — SODIUM CHLORIDE 0.9 % IV SOLN: INTRAVENOUS | Status: DC

## 2012-11-17 MED ORDER — GLYCOPYRROLATE 0.2 MG/ML IJ SOLN
INTRAMUSCULAR | Status: DC | PRN
  Administered 2012-11-17: .6 mg via INTRAVENOUS

## 2012-11-17 MED ORDER — STERILE WATER FOR IRRIGATION IR SOLN
Status: DC | PRN
  Administered 2012-11-17: 4500 mL

## 2012-11-17 MED ORDER — MIDAZOLAM HCL 5 MG/5ML IJ SOLN
INTRAMUSCULAR | Status: DC | PRN
  Administered 2012-11-17: 0.5 mg via INTRAVENOUS

## 2012-11-17 MED ORDER — ONDANSETRON HCL 4 MG PO TABS: 4.0000 mg | ORAL_TABLET | Freq: Four times a day (QID) | ORAL | Status: DC | PRN

## 2012-11-17 MED ORDER — CISATRACURIUM BESYLATE (PF) 10 MG/5ML IV SOLN
INTRAVENOUS | Status: DC | PRN
  Administered 2012-11-17: 6 mg via INTRAVENOUS

## 2012-11-17 MED ORDER — ONDANSETRON HCL 4 MG/2ML IJ SOLN
INTRAMUSCULAR | Status: DC | PRN
  Administered 2012-11-17 (×2): 2 mg via INTRAVENOUS

## 2012-11-17 MED ORDER — RIVAROXABAN 10 MG PO TABS
10.0000 mg | ORAL_TABLET | Freq: Every day | ORAL | Status: DC
  Administered 2012-11-18 – 2012-11-19 (×2): 10 mg via ORAL
  Filled 2012-11-17 (×4): qty 1

## 2012-11-17 MED ORDER — POLYETHYLENE GLYCOL 3350 17 G PO PACK: 17.0000 g | PACK | Freq: Every day | ORAL | Status: DC | PRN

## 2012-11-17 MED ORDER — DEXTROSE-NACL 5-0.9 % IV SOLN
INTRAVENOUS | Status: DC
  Administered 2012-11-17: 75 mL/h via INTRAVENOUS

## 2012-11-17 MED ORDER — METHOCARBAMOL 100 MG/ML IJ SOLN: 500.0000 mg | Freq: Four times a day (QID) | INTRAVENOUS | Status: DC | PRN

## 2012-11-17 MED ORDER — 0.9 % SODIUM CHLORIDE (POUR BTL) OPTIME
TOPICAL | Status: DC | PRN
  Administered 2012-11-17: 1000 mL

## 2012-11-17 MED ORDER — METHOCARBAMOL 500 MG PO TABS
500.0000 mg | ORAL_TABLET | Freq: Four times a day (QID) | ORAL | Status: DC | PRN
  Administered 2012-11-18 – 2012-11-19 (×5): 500 mg via ORAL
  Filled 2012-11-17 (×5): qty 1

## 2012-11-17 MED ORDER — ALBUTEROL SULFATE HFA 108 (90 BASE) MCG/ACT IN AERS
2.0000 | INHALATION_SPRAY | Freq: Four times a day (QID) | RESPIRATORY_TRACT | Status: DC | PRN
  Filled 2012-11-17: qty 6.7

## 2012-11-17 MED ORDER — HYDROMORPHONE HCL PF 1 MG/ML IJ SOLN
0.2500 mg | INTRAMUSCULAR | Status: DC | PRN
  Administered 2012-11-17 (×3): 0.5 mg via INTRAVENOUS

## 2012-11-17 MED ORDER — PHENOL 1.4 % MT LIQD: 1.0000 | OROMUCOSAL | Status: DC | PRN

## 2012-11-17 MED ORDER — METOCLOPRAMIDE HCL 5 MG/ML IJ SOLN: 5.0000 mg | Freq: Three times a day (TID) | INTRAMUSCULAR | Status: DC | PRN

## 2012-11-17 MED ORDER — NEOSTIGMINE METHYLSULFATE 1 MG/ML IJ SOLN
INTRAMUSCULAR | Status: DC | PRN
  Administered 2012-11-17: 4 mg via INTRAVENOUS

## 2012-11-17 MED ORDER — ACETAMINOPHEN 10 MG/ML IV SOLN: 1000.0000 mg | Freq: Once | INTRAVENOUS | Status: DC | PRN

## 2012-11-17 MED ORDER — ACETAMINOPHEN 650 MG RE SUPP: 650.0000 mg | Freq: Four times a day (QID) | RECTAL | Status: DC | PRN

## 2012-11-17 MED ORDER — PANTOPRAZOLE SODIUM 40 MG PO TBEC
40.0000 mg | DELAYED_RELEASE_TABLET | Freq: Every day | ORAL | Status: DC
  Administered 2012-11-18 – 2012-11-19 (×2): 40 mg via ORAL
  Filled 2012-11-17 (×2): qty 1

## 2012-11-17 MED ORDER — HYDROCHLOROTHIAZIDE 25 MG PO TABS
25.0000 mg | ORAL_TABLET | Freq: Every day | ORAL | Status: DC
  Administered 2012-11-17 – 2012-11-19 (×3): 25 mg via ORAL
  Filled 2012-11-17 (×5): qty 1

## 2012-11-17 MED ORDER — DIPHENHYDRAMINE HCL 12.5 MG/5ML PO ELIX: 12.5000 mg | ORAL_SOLUTION | ORAL | Status: DC | PRN

## 2012-11-17 MED ORDER — FLEET ENEMA 7-19 GM/118ML RE ENEM: 1.0000 | ENEMA | Freq: Once | RECTAL | Status: AC | PRN

## 2012-11-17 MED ORDER — VANCOMYCIN HCL 10 G IV SOLR
1500.0000 mg | INTRAVENOUS | Status: AC
  Administered 2012-11-17: 1500 mg via INTRAVENOUS
  Filled 2012-11-17: qty 1500

## 2012-11-17 MED ORDER — VANCOMYCIN HCL IN DEXTROSE 1-5 GM/200ML-% IV SOLN
1000.0000 mg | Freq: Two times a day (BID) | INTRAVENOUS | Status: AC
  Administered 2012-11-18: 1000 mg via INTRAVENOUS
  Filled 2012-11-17: qty 200

## 2012-11-17 MED ORDER — TRAMADOL HCL 50 MG PO TABS
50.0000 mg | ORAL_TABLET | Freq: Four times a day (QID) | ORAL | Status: DC | PRN
  Administered 2012-11-18 – 2012-11-19 (×2): 100 mg via ORAL
  Filled 2012-11-17 (×2): qty 2

## 2012-11-17 MED ORDER — SODIUM CHLORIDE 0.9 % IR SOLN
Status: DC | PRN
  Administered 2012-11-17: 1000 mL

## 2012-11-17 MED ORDER — ACETAMINOPHEN 10 MG/ML IV SOLN
1000.0000 mg | Freq: Once | INTRAVENOUS | Status: AC
  Administered 2012-11-17: 1000 mg via INTRAVENOUS

## 2012-11-17 MED ORDER — HYDROMORPHONE HCL PF 1 MG/ML IJ SOLN
INTRAMUSCULAR | Status: DC | PRN
  Administered 2012-11-17 (×4): 0.5 mg via INTRAVENOUS

## 2012-11-17 MED ORDER — ATORVASTATIN CALCIUM 20 MG PO TABS
20.0000 mg | ORAL_TABLET | Freq: Every day | ORAL | Status: DC
  Administered 2012-11-17 – 2012-11-19 (×3): 20 mg via ORAL
  Filled 2012-11-17 (×4): qty 1

## 2012-11-17 MED ORDER — CHLORHEXIDINE GLUCONATE 4 % EX LIQD
60.0000 mL | Freq: Once | CUTANEOUS | Status: DC
  Filled 2012-11-17: qty 60

## 2012-11-17 MED ORDER — MORPHINE SULFATE 2 MG/ML IJ SOLN
1.0000 mg | INTRAMUSCULAR | Status: DC | PRN
  Administered 2012-11-17: 2 mg via INTRAVENOUS
  Filled 2012-11-17: qty 1

## 2012-11-17 MED ORDER — MEPERIDINE HCL 50 MG/ML IJ SOLN: 6.2500 mg | INTRAMUSCULAR | Status: DC | PRN

## 2012-11-17 MED ORDER — MENTHOL 3 MG MT LOZG: 1.0000 | LOZENGE | OROMUCOSAL | Status: DC | PRN

## 2012-11-17 MED ORDER — LOSARTAN POTASSIUM 50 MG PO TABS
50.0000 mg | ORAL_TABLET | Freq: Every day | ORAL | Status: DC
  Administered 2012-11-17 – 2012-11-19 (×3): 50 mg via ORAL
  Filled 2012-11-17 (×4): qty 1

## 2012-11-17 MED ORDER — ACETAMINOPHEN 325 MG PO TABS: 650.0000 mg | ORAL_TABLET | Freq: Four times a day (QID) | ORAL | Status: DC | PRN

## 2012-11-17 MED ORDER — OXYCODONE HCL 5 MG/5ML PO SOLN
5.0000 mg | Freq: Once | ORAL | Status: DC | PRN
  Filled 2012-11-17: qty 5

## 2012-11-17 MED ORDER — BISACODYL 10 MG RE SUPP: 10.0000 mg | Freq: Every day | RECTAL | Status: DC | PRN

## 2012-11-17 MED ORDER — FENTANYL CITRATE 0.05 MG/ML IJ SOLN
INTRAMUSCULAR | Status: DC | PRN
  Administered 2012-11-17 (×2): 25 ug via INTRAVENOUS
  Administered 2012-11-17: 50 ug via INTRAVENOUS

## 2012-11-17 MED ORDER — ONDANSETRON HCL 4 MG/2ML IJ SOLN: 4.0000 mg | Freq: Four times a day (QID) | INTRAMUSCULAR | Status: DC | PRN

## 2012-11-17 MED ORDER — SUCCINYLCHOLINE CHLORIDE 20 MG/ML IJ SOLN
INTRAMUSCULAR | Status: DC | PRN
  Administered 2012-11-17: 100 mg via INTRAVENOUS

## 2012-11-17 MED ORDER — BUPIVACAINE LIPOSOME 1.3 % IJ SUSP
20.0000 mL | Freq: Once | INTRAMUSCULAR | Status: DC
  Filled 2012-11-17: qty 20

## 2012-11-17 MED ORDER — METOCLOPRAMIDE HCL 10 MG PO TABS: 5.0000 mg | ORAL_TABLET | Freq: Three times a day (TID) | ORAL | Status: DC | PRN

## 2012-11-17 MED ORDER — DEXAMETHASONE SODIUM PHOSPHATE 10 MG/ML IJ SOLN: 10.0000 mg | Freq: Every day | INTRAMUSCULAR | Status: AC

## 2012-11-17 MED ORDER — KETAMINE HCL 10 MG/ML IJ SOLN
INTRAMUSCULAR | Status: DC | PRN
  Administered 2012-11-17 (×2): 5 mg via INTRAVENOUS

## 2012-11-17 MED ORDER — LEVOTHYROXINE SODIUM 100 MCG PO TABS
100.0000 ug | ORAL_TABLET | Freq: Every day | ORAL | Status: DC
  Administered 2012-11-18 – 2012-11-19 (×2): 100 ug via ORAL
  Filled 2012-11-17 (×4): qty 1

## 2012-11-17 MED ORDER — PROPOFOL 10 MG/ML IV EMUL
INTRAVENOUS | Status: DC | PRN
  Administered 2012-11-17: 130 mg via INTRAVENOUS

## 2012-11-17 MED ORDER — DEXAMETHASONE 4 MG PO TABS
10.0000 mg | ORAL_TABLET | Freq: Every day | ORAL | Status: AC
  Administered 2012-11-18: 10 mg via ORAL
  Filled 2012-11-17 (×2): qty 1

## 2012-11-17 MED ORDER — SODIUM CHLORIDE 0.9 % IJ SOLN
INTRAMUSCULAR | Status: DC | PRN
  Administered 2012-11-17: 15:00:00

## 2012-11-17 MED ORDER — LIDOCAINE HCL (CARDIAC) 20 MG/ML IV SOLN
INTRAVENOUS | Status: DC | PRN
  Administered 2012-11-17: 20 mg via INTRAVENOUS

## 2012-11-17 MED ORDER — OXYCODONE HCL 5 MG PO TABS
5.0000 mg | ORAL_TABLET | ORAL | Status: DC | PRN
  Administered 2012-11-17 – 2012-11-18 (×2): 5 mg via ORAL
  Administered 2012-11-18 (×2): 10 mg via ORAL
  Administered 2012-11-18 (×2): 5 mg via ORAL
  Administered 2012-11-18: 10 mg via ORAL
  Administered 2012-11-19 (×2): 5 mg via ORAL
  Filled 2012-11-17: qty 2
  Filled 2012-11-17 (×5): qty 1
  Filled 2012-11-17: qty 2
  Filled 2012-11-17: qty 1
  Filled 2012-11-17: qty 2

## 2012-11-17 MED ORDER — DOCUSATE SODIUM 100 MG PO CAPS
100.0000 mg | ORAL_CAPSULE | Freq: Two times a day (BID) | ORAL | Status: DC
  Administered 2012-11-17 – 2012-11-19 (×4): 100 mg via ORAL

## 2012-11-17 MED ORDER — PROMETHAZINE HCL 25 MG/ML IJ SOLN: 6.2500 mg | INTRAMUSCULAR | Status: DC | PRN

## 2012-11-17 MED ORDER — POTASSIUM CHLORIDE ER 10 MEQ PO TBCR
10.0000 meq | EXTENDED_RELEASE_TABLET | Freq: Every day | ORAL | Status: DC
  Administered 2012-11-17 – 2012-11-19 (×3): 10 meq via ORAL
  Filled 2012-11-17 (×4): qty 1

## 2012-11-17 MED ORDER — LACTATED RINGERS IV SOLN
INTRAVENOUS | Status: DC | PRN
  Administered 2012-11-17: 13:00:00 via INTRAVENOUS

## 2012-11-17 MED ORDER — ACETAMINOPHEN 10 MG/ML IV SOLN
1000.0000 mg | Freq: Four times a day (QID) | INTRAVENOUS | Status: AC
  Administered 2012-11-17 – 2012-11-18 (×3): 1000 mg via INTRAVENOUS
  Filled 2012-11-17 (×5): qty 100

## 2012-11-17 SURGICAL SUPPLY — 55 items
BAG SPEC THK2 15X12 ZIP CLS (MISCELLANEOUS) ×1
BAG ZIPLOCK 12X15 (MISCELLANEOUS) ×2 IMPLANT
BANDAGE ELASTIC 6 VELCRO ST LF (GAUZE/BANDAGES/DRESSINGS) ×2 IMPLANT
BANDAGE ESMARK 6X9 LF (GAUZE/BANDAGES/DRESSINGS) ×1 IMPLANT
BLADE SAG 18X100X1.27 (BLADE) ×2 IMPLANT
BLADE SAW SGTL 11.0X1.19X90.0M (BLADE) ×2 IMPLANT
BNDG CMPR 9X6 STRL LF SNTH (GAUZE/BANDAGES/DRESSINGS) ×1
BNDG ESMARK 6X9 LF (GAUZE/BANDAGES/DRESSINGS) ×2
BOWL SMART MIX CTS (DISPOSABLE) ×2 IMPLANT
CEMENT HV SMART SET (Cement) ×4 IMPLANT
CLOTH BEACON ORANGE TIMEOUT ST (SAFETY) ×2 IMPLANT
CUFF TOURN SGL QUICK 34 (TOURNIQUET CUFF) ×2
CUFF TRNQT CYL 34X4X40X1 (TOURNIQUET CUFF) ×1 IMPLANT
DRAPE EXTREMITY T 121X128X90 (DRAPE) ×2 IMPLANT
DRAPE POUCH INSTRU U-SHP 10X18 (DRAPES) ×2 IMPLANT
DRAPE U-SHAPE 47X51 STRL (DRAPES) ×2 IMPLANT
DRSG ADAPTIC 3X8 NADH LF (GAUZE/BANDAGES/DRESSINGS) ×2 IMPLANT
DRSG PAD ABDOMINAL 8X10 ST (GAUZE/BANDAGES/DRESSINGS) ×1 IMPLANT
DURAPREP 26ML APPLICATOR (WOUND CARE) ×2 IMPLANT
ELECT REM PT RETURN 9FT ADLT (ELECTROSURGICAL) ×2
ELECTRODE REM PT RTRN 9FT ADLT (ELECTROSURGICAL) ×1 IMPLANT
EVACUATOR 1/8 PVC DRAIN (DRAIN) ×2 IMPLANT
FACESHIELD LNG OPTICON STERILE (SAFETY) ×10 IMPLANT
GLOVE BIO SURGEON STRL SZ7.5 (GLOVE) ×2 IMPLANT
GLOVE BIO SURGEON STRL SZ8 (GLOVE) ×2 IMPLANT
GLOVE BIOGEL PI IND STRL 8 (GLOVE) ×2 IMPLANT
GLOVE BIOGEL PI INDICATOR 8 (GLOVE) ×2
GLOVE SURG SS PI 6.5 STRL IVOR (GLOVE) ×4 IMPLANT
GOWN STRL NON-REIN LRG LVL3 (GOWN DISPOSABLE) ×4 IMPLANT
GOWN STRL REIN XL XLG (GOWN DISPOSABLE) ×2 IMPLANT
HANDPIECE INTERPULSE COAX TIP (DISPOSABLE) ×2
IMMOBILIZER KNEE 20 (SOFTGOODS) ×2
IMMOBILIZER KNEE 20 THIGH 36 (SOFTGOODS) ×1 IMPLANT
KIT BASIN OR (CUSTOM PROCEDURE TRAY) ×2 IMPLANT
MANIFOLD NEPTUNE II (INSTRUMENTS) ×2 IMPLANT
NDL SAFETY ECLIPSE 18X1.5 (NEEDLE) ×1 IMPLANT
NEEDLE HYPO 18GX1.5 SHARP (NEEDLE) ×2
NS IRRIG 1000ML POUR BTL (IV SOLUTION) ×2 IMPLANT
PACK TOTAL JOINT (CUSTOM PROCEDURE TRAY) ×2 IMPLANT
PAD ABD 7.5X8 STRL (GAUZE/BANDAGES/DRESSINGS) ×2 IMPLANT
PADDING CAST COTTON 6X4 STRL (CAST SUPPLIES) ×5 IMPLANT
POSITIONER SURGICAL ARM (MISCELLANEOUS) ×2 IMPLANT
SET HNDPC FAN SPRY TIP SCT (DISPOSABLE) ×1 IMPLANT
SPONGE GAUZE 4X4 12PLY (GAUZE/BANDAGES/DRESSINGS) ×2 IMPLANT
STRIP CLOSURE SKIN 1/2X4 (GAUZE/BANDAGES/DRESSINGS) ×4 IMPLANT
SUCTION FRAZIER 12FR DISP (SUCTIONS) ×2 IMPLANT
SUT MNCRL AB 4-0 PS2 18 (SUTURE) ×2 IMPLANT
SUT VIC AB 2-0 CT1 27 (SUTURE) ×6
SUT VIC AB 2-0 CT1 TAPERPNT 27 (SUTURE) ×3 IMPLANT
SUT VLOC 180 0 24IN GS25 (SUTURE) ×2 IMPLANT
SYR 50ML LL SCALE MARK (SYRINGE) ×2 IMPLANT
TOWEL OR 17X26 10 PK STRL BLUE (TOWEL DISPOSABLE) ×4 IMPLANT
TRAY FOLEY CATH 14FRSI W/METER (CATHETERS) ×2 IMPLANT
WATER STERILE IRR 1500ML POUR (IV SOLUTION) ×2 IMPLANT
WRAP KNEE MAXI GEL POST OP (GAUZE/BANDAGES/DRESSINGS) ×4 IMPLANT

## 2012-11-17 NOTE — Anesthesia Preprocedure Evaluation (Addendum)
Anesthesia Evaluation  Patient identified by MRN, date of birth, ID band Patient awake    Reviewed: Allergy & Precautions, H&P , NPO status , Patient's Chart, lab work & pertinent test results  Airway Mallampati: I TM Distance: >3 FB Neck ROM: Full    Dental  (+) Dental Advisory Given, Edentulous Upper and Edentulous Lower   Pulmonary neg pulmonary ROS, shortness of breath and with exertion, asthma , pneumonia -, resolved, COPD breath sounds clear to auscultation        Cardiovascular hypertension, Pt. on medications + Peripheral Vascular Disease + Valvular Problems/Murmurs AI Rhythm:Regular Rate:Tachycardia  Echo 11/05/2012 - Left ventricle: The cavity size was normal. Wall thickness  was normal. Systolic function was normal. The estimated  ejection fraction was in the range of 60% to 65%. Features  are consistent with a pseudonormal left ventricular  filling pattern, with concomitant abnormal relaxation and  increased filling pressure (grade 2 diastolic   dysfunction). - Aortic valve: Mild regurgitation. Mean gradient: 9mm Hg   (S). Peak gradient: 16mm Hg (S). - Mitral valve: Mild to moderate regurgitation. - Left atrium: The atrium was mildly dilated. - Right atrium: The atrium was mildly dilated. - Pulmonary arteries: Systolic pressure was moderately   increased. PA peak pressure: 49mm Hg (S).    Neuro/Psych PSYCHIATRIC DISORDERS Anxiety Depression TIACVA, Residual Symptoms    GI/Hepatic Neg liver ROS, GERD-  Medicated,  Endo/Other  Hypothyroidism   Renal/GU negative Renal ROS     Musculoskeletal  (+) Arthritis -, Osteoarthritis,    Abdominal   Peds  Hematology negative hematology ROS (+)   Anesthesia Other Findings   Reproductive/Obstetrics negative OB ROS                         Anesthesia Physical Anesthesia Plan  ASA: III  Anesthesia Plan: General   Post-op Pain Management:     Induction: Intravenous  Airway Management Planned: Oral ETT  Additional Equipment:   Intra-op Plan:   Post-operative Plan: Extubation in OR  Informed Consent: I have reviewed the patients History and Physical, chart, labs and discussed the procedure including the risks, benefits and alternatives for the proposed anesthesia with the patient or authorized representative who has indicated his/her understanding and acceptance.   Dental advisory given  Plan Discussed with: CRNA  Anesthesia Plan Comments:       Anesthesia Quick Evaluation

## 2012-11-17 NOTE — H&P (View-Only) (Signed)
[name]  DOB: 12/21/1934 Married / Language: English / Race: White Female  Date of Admission:  11/17/2012  Chief Complaint:  Left Knee Pain  History of Present Illness The patient is a 78 year old female who comes in for a preoperative History and Physical. The patient is scheduled for a left total knee arthroplasty to be performed by Dr. Frank V. Aluisio, MD at [facility] on 11/17/2012. The patient comes in today 6 years out from right total knee arthroplasty. The patient states that she is doing very well at this time. The right knee is doing great but the left knee is getting progressively worse over time. The left knee is as bad as the right one was prior to her surgery there. She is at a stage where she can not get around like she would like to. She also has pulmonary issues and has required oxygen intermittently for activities. At this stage she said the left knee is bothering her bad enough where she would like to get it fixed if possible. She has had cortisone injections in the past. They have not provided any long term benefit. She is ready to get the left one fixed. They have been treated conservatively in the past for the above stated problem and despite conservative measures, they continue to have progressive pain and severe functional limitations and dysfunction. They have failed non-operative management including home exercise, medications, and injections. It is felt that they would benefit from undergoing total joint replacement. Risks and benefits of the procedure have been discussed with the patient and they elect to proceed with surgery. There are no active contraindications to surgery such as ongoing infection or rapidly progressive neurological disease.    Problem List S/P knee replacement (V43.65) Primary osteoarthritis of one knee (715.16)   Allergies Penicillins. Hives. Relafen *ANALGESICS - ANTI-INFLAMMATORY* Oruvail *ANALGESICS -  ANTI-INFLAMMATORY*   Family History Diabetes Mellitus. mother Congestive Heart Failure. mother and brother Heart disease in female family member before age 65 Drug / Alcohol Addiction. brother Cerebrovascular Accident. sister Mother. Deceased. Congestive Heart Failure Father. Deceased. Emphysema   Social History Number of flights of stairs before winded. 2-3 Marital status. married Tobacco / smoke exposure. yes outdoors only Pain Contract. no Living situation. live with spouse Drug/Alcohol Rehab (Previously). no Drug/Alcohol Rehab (Currently). no Illicit drug use. no Exercise. Exercises never Tobacco use. never smoker Current work status. retired Children. 3 Alcohol use. never consumed alcohol   Medication History Osteo Bi-Flex Triple Strength ( Oral) Active. Synthroid (100MCG Tablet, Oral) Active. Cozaar (50MG Tablet, Oral) Active. Aleve (220MG Capsule, Oral) Active. Aspirin EC (81MG Tablet DR, Oral) Active. Vitamin B12 TR ( Oral) Specific dose unknown - Active. Fish Oil Active. Calcium "900" w/D ( Oral) Active. Premarin ( Oral) Specific dose unknown - Active.   Past Surgical History Colectomy. partial Arthroscopy of Knee. right Total Knee Replacement. right Hysterectomy. partial (non-cancerous) Carotid Artery Surgery - Right. Endarterectomy Tubal Ligation Morton's Neuroma Surgery Right Foot   Medical History Hiatal Hernia Hypercholesterolemia High blood pressure Gastroesophageal Reflux Disease Cerebrovascular Accident. Right Ocular Stoke - Partial Blindness Asthma Diverticulitis Of Colon Chronic Cystitis Impaired Vision. Partial Blindness Right Eye Carotid Artery Stenosis. Mild on Left   Review of Systems General:Not Present- Chills, Fever, Night Sweats, Fatigue, Weight Gain, Weight Loss and Memory Loss. Skin:Not Present- Hives, Itching, Rash, Eczema and Lesions. HEENT:Not Present- Tinnitus, Headache, Double  Vision, Visual Loss, Hearing Loss and Dentures. Respiratory:Present- Shortness of breath with exertion. Not Present- Shortness of   breath at rest, Allergies, Coughing up blood and Chronic Cough. Cardiovascular:Not Present- Chest Pain, Racing/skipping heartbeats, Difficulty Breathing Lying Down, Murmur, Swelling and Palpitations. Gastrointestinal:Not Present- Bloody Stool, Heartburn, Abdominal Pain, Vomiting, Nausea, Constipation, Diarrhea, Difficulty Swallowing, Jaundice and Loss of appetitie. Female Genitourinary:Present- Urinary frequency and Urinating at Night. Not Present- Blood in Urine, Weak urinary stream, Discharge, Flank Pain, Incontinence, Painful Urination, Urgency and Urinary Retention. Musculoskeletal:Present- Joint Pain. Not Present- Muscle Weakness, Muscle Pain, Joint Swelling, Back Pain, Morning Stiffness and Spasms. Neurological:Not Present- Tremor, Dizziness, Blackout spells, Paralysis, Difficulty with balance and Weakness. Psychiatric:Not Present- Insomnia.   Vitals Weight: 185 lb Height: 65 in Body Surface Area: 1.96 m Body Mass Index: 30.79 kg/m Pulse: 76 (Regular) Resp.: 14 (Unlabored) BP: 142/88 (Sitting, Left Arm, Standard)    Physical Exam The physical exam findings are as follows:  Note: Patient is a 78 year old female with continued left knee pain.   General Mental Status - Alert, cooperative and good historian. General Appearance- pleasant. Not in acute distress. Orientation- Oriented X3. Build & Nutrition- Well nourished and Well developed.   Head and Neck Head- normocephalic, atraumatic . Neck Global Assessment- supple. no bruit auscultated on the right and no bruit auscultated on the left. Note: Previous right carotid endarterectomy incision noted.   Eye Pupil- Bilateral- Regular and Round. Motion- Bilateral- EOMI.   Chest and Lung Exam Auscultation: Breath sounds:- clear at anterior chest wall and -  clear at posterior chest wall. Adventitious sounds:Inspiratory wheeze- Both Lung Fields (Right more so than Left), Right Lower Lobe (Anterior), Right Middle Lobe (Anterior) and Left Upper Lobe (Anterior).   Cardiovascular Auscultation:Rhythm- Regular rate and rhythm. Heart Sounds- S1 WNL and S2 WNL. Murmurs & Other Heart Sounds:Auscultation of the heart reveals - No Murmurs.   Abdomen Palpation/Percussion:Tenderness- Abdomen is non-tender to palpation. Rigidity (guarding)- Abdomen is soft. Auscultation:Auscultation of the abdomen reveals - Bowel sounds normal.   Female Genitourinary Not done, not pertinent to present illness  Musculoskeletal Well developed female alert and oriented in no apparent distress. Both hips show normal range of motion with no discomfort. Her right knee shows no swelling. Range of motion 0 to 125 degrees. There is no tenderness or instability in the right knee. The left knee varus deformity, range 5 to 120, marked crepitus on range of motion, tenderness medial greater than lateral with no instability.  RADIOGRAPHS: AP and lateral of both knees show prosthesis on the right in excellent position, no periprosthetic abnormalities. On the left she is bone on bone arthritis medial and patellofemoral compartments.  Assessment & Plan Primary osteoarthritis of one knee (715.16) Impression: Left Knee  Note: Plan is for a Left Total Knee Replacement by Dr. Aluisio.  Plan is to go home.  Signed electronically by DREW L PERKINS, PA-C 

## 2012-11-17 NOTE — Progress Notes (Signed)
Called Dr Renold Don that patient had 8 oz of black coffee this morning at 9 AM. Dr Renold Don states this will not affect surgery time.

## 2012-11-17 NOTE — Anesthesia Postprocedure Evaluation (Signed)
Anesthesia Post Note  Patient: [name]  Procedure(s) Performed: Procedure(s) (LRB): TOTAL KNEE ARTHROPLASTY (Left)  Anesthesia type: General  Patient location: PACU  Post pain: Pain level controlled  Post assessment: Post-op Vital signs reviewed  Last Vitals: BP 147/71  Pulse 60  Temp(Src) 36.4 C (Oral)  Resp 12  Ht 5\' 5"  (1.651 m)  Wt 182 lb (82.555 kg)  BMI 30.29 kg/m2  SpO2 94%  Post vital signs: Reviewed  Level of consciousness: sedated  Complications: No apparent anesthesia complications

## 2012-11-17 NOTE — Interval H&P Note (Signed)
History and Physical Interval Note:  11/17/2012 12:28 PM  [name]  has presented today for surgery, with the diagnosis of oa left knee   The various methods of treatment have been discussed with the patient and family. After consideration of risks, benefits and other options for treatment, the patient has consented to  Procedure(s): TOTAL KNEE ARTHROPLASTY (Left) as a surgical intervention .  The patient's history has been reviewed, patient examined, no change in status, stable for surgery.  I have reviewed the patient's chart and labs.  Questions were answered to the patient's satisfaction.     Loanne Drilling

## 2012-11-17 NOTE — Transfer of Care (Signed)
Immediate Anesthesia Transfer of Care Note  Patient: [name]  Procedure(s) Performed: Procedure(s): TOTAL KNEE ARTHROPLASTY (Left)  Patient Location: PACU  Anesthesia Type:General  Level of Consciousness: awake, alert , oriented and patient cooperative  Airway & Oxygen Therapy: Patient Spontanous Breathing and Patient connected to face mask oxygen  Post-op Assessment: Report given to PACU RN, Post -op Vital signs reviewed and stable and Patient moving all extremities  Post vital signs: Reviewed and stable  Complications: No apparent anesthesia complications

## 2012-11-17 NOTE — Op Note (Signed)
Pre-operative diagnosis- Osteoarthritis  Left knee(s)  Post-operative diagnosis- Osteoarthritis Left knee(s)  Procedure-  Left  Total Knee Arthroplasty  Surgeon- Gus Rankin. Jammie Troup, MD  Assistant- Dimitri Ped, PA-C   Anesthesia- General EBL-* No blood loss amount entered *  Drains Hemovac  Tourniquet time-  Total Tourniquet Time Documented: Thigh (Left) - 39 minutes Total: Thigh (Left) - 39 minutes    Complications- None  Condition-PACU - hemodynamically stable.   Brief Clinical Note  [name] is a 78 y.o. year old female with end stage OA of her left knee with progressively worsening pain and dysfunction. She has constant pain, with activity and at rest and significant functional deficits with difficulties even with ADLs. She has had extensive non-op management including analgesics, injections of cortisone and viscosupplements, and home exercise program, but remains in significant pain with significant dysfunction. Radiographs show bone on bone arthritis medial and patellofemoral. She presents now for left Total Knee Arthroplasty.    Procedure in detail---   The patient is brought into the operating room and positioned supine on the operating table. After successful administration of  General,   a tourniquet is placed high on the Left thigh(s) and the lower extremity is prepped and draped in the usual sterile fashion. Time out is performed by the operating team and then the  Left lower extremity is wrapped in Esmarch, knee flexed and the tourniquet inflated to 300 mmHg.       A midline incision is made with a ten blade through the subcutaneous tissue to the level of the extensor mechanism. A fresh blade is used to make a medial parapatellar arthrotomy. Soft tissue over the proximal medial tibia is subperiosteally elevated to the joint line with a knife and into the semimembranosus bursa with a Cobb elevator. Soft tissue over the proximal lateral tibia is elevated with attention  being paid to avoiding the patellar tendon on the tibial tubercle. The patella is everted, knee flexed 90 degrees and the ACL and PCL are removed. Findings are bone on bone medial and patellofemoral with large global osteophytes.        The drill is used to create a starting hole in the distal femur and the canal is thoroughly irrigated with sterile saline to remove the fatty contents. The 5 degree Left  valgus alignment guide is placed into the femoral canal and the distal femoral cutting block is pinned to remove 10 mm off the distal femur. Resection is made with an oscillating saw.      The tibia is subluxed forward and the menisci are removed. The extramedullary alignment guide is placed referencing proximally at the medial aspect of the tibial tubercle and distally along the second metatarsal axis and tibial crest. The block is pinned to remove 2mm off the more deficient medial  side. Resection is made with an oscillating saw. Size 3is the most appropriate size for the tibia and the proximal tibia is prepared with the modular drill and keel punch for that size.      The femoral sizing guide is placed and size 3 is most appropriate. Rotation is marked off the epicondylar axis and confirmed by creating a rectangular flexion gap at 90 degrees. The size 3 cutting block is pinned in this rotation and the anterior, posterior and chamfer cuts are made with the oscillating saw. The intercondylar block is then placed and that cut is made.      Trial size 3 tibial component, trial size 3 posterior stabilized  femur and a 10  mm posterior stabilized rotating platform insert trial is placed. Full extension is achieved with excellent varus/valgus and anterior/posterior balance throughout full range of motion. The patella is everted and thickness measured to be 22  mm. Free hand resection is taken to 12 mm, a 38 template is placed, lug holes are drilled, trial patella is placed, and it tracks normally. Osteophytes are  removed off the posterior femur with the trial in place. All trials are removed and the cut bone surfaces prepared with pulsatile lavage. Cement is mixed and once ready for implantation, the size 3 tibial implant, size  3 posterior stabilized femoral component, and the size 38 patella are cemented in place and the patella is held with the clamp. The trial insert is placed and the knee held in full extension. The Exparel (20 ml mixed with 50 ml saline) is injected into the extensor mechanism, posterior capsule, medial and lateral gutters and subcutaneous tissues.  All extruded cement is removed and once the cement is hard the permanent 10 mm posterior stabilized rotating platform insert is placed into the tibial tray.      The wound is copiously irrigated with saline solution and the extensor mechanism closed over a hemovac drain with #1 PDS suture. The tourniquet is released for a total tourniquet time of 39  minutes. Flexion against gravity is 140 degrees and the patella tracks normally. Subcutaneous tissue is closed with 2.0 vicryl and subcuticular with running 4.0 Monocryl. The incision is cleaned and dried and steri-strips and a bulky sterile dressing are applied. The limb is placed into a knee immobilizer and the patient is awakened and transported to recovery in stable condition.      Please note that a surgical assistant was a medical necessity for this procedure in order to perform it in a safe and expeditious manner. Surgical assistant was necessary to retract the ligaments and vital neurovascular structures to prevent injury to them and also necessary for proper positioning of the limb to allow for anatomic placement of the prosthesis.   Gus Rankin Angel Stopher, MD    11/17/2012, 2:55 PM

## 2012-11-18 ENCOUNTER — Encounter (HOSPITAL_COMMUNITY): Payer: Self-pay | Admitting: Orthopedic Surgery

## 2012-11-18 LAB — BASIC METABOLIC PANEL
CO2: 27 mEq/L (ref 19–32)
Glucose, Bld: 148 mg/dL — ABNORMAL HIGH (ref 70–99)
Potassium: 4.3 mEq/L (ref 3.5–5.1)
Sodium: 135 mEq/L (ref 135–145)

## 2012-11-18 LAB — CBC
Hemoglobin: 10.2 g/dL — ABNORMAL LOW (ref 12.0–15.0)
MCH: 31.8 pg (ref 26.0–34.0)
RBC: 3.21 MIL/uL — ABNORMAL LOW (ref 3.87–5.11)

## 2012-11-18 MED ORDER — BLISTEX EX OINT
TOPICAL_OINTMENT | CUTANEOUS | Status: AC
  Administered 2012-11-18: 1
  Filled 2012-11-18: qty 10

## 2012-11-18 MED ORDER — METHOCARBAMOL 500 MG PO TABS: 500.0000 mg | ORAL_TABLET | Freq: Four times a day (QID) | ORAL | Status: DC | PRN

## 2012-11-18 MED ORDER — RIVAROXABAN 10 MG PO TABS: 10.0000 mg | ORAL_TABLET | Freq: Every day | ORAL | Status: DC

## 2012-11-18 MED ORDER — OXYCODONE HCL 5 MG PO TABS: 5.0000 mg | ORAL_TABLET | ORAL | Status: DC | PRN

## 2012-11-18 MED ORDER — TRAMADOL HCL 50 MG PO TABS: 50.0000 mg | ORAL_TABLET | Freq: Four times a day (QID) | ORAL | Status: DC | PRN

## 2012-11-18 NOTE — Evaluation (Signed)
Physical Therapy Evaluation Patient Details Name: [name] MRN: [id number] DOB: [birth year] Today's Date: 11/18/2012 Time: 4098-1191 PT Time Calculation (min): 20 min  PT Assessment / Plan / Recommendation Clinical Impression  Pt presents s/p L TKA POD 1 with decreased strength, ROM and mobility.  Tolerated OOB and a few steps, however became dizzy and needed to return to chair.  Pt will benefit from skilled PT in acute venue to address deficits.  PT recommends HHPT for follow up at D/C to maximize pts safety and fucntion.     PT Assessment  Patient needs continued PT services    Follow Up Recommendations  Home health PT;Supervision for mobility/OOB    Does the patient have the potential to tolerate intense rehabilitation      Barriers to Discharge None      Equipment Recommendations  None recommended by PT    Recommendations for Other Services OT consult   Frequency 7X/week    Precautions / Restrictions Precautions Precautions: Knee Required Braces or Orthoses: Knee Immobilizer - Left Knee Immobilizer - Left: Discontinue once straight leg raise with < 10 degree lag Restrictions Weight Bearing Restrictions: No Other Position/Activity Restrictions: WBAT   Pertinent Vitals/Pain 5/10 ice pack applied      Mobility  Bed Mobility Bed Mobility: Supine to Sit Supine to Sit: 4: Min assist;HOB elevated;With rails Details for Bed Mobility Assistance: Assist for LLE out of bed with cues for hand placement and technique to self assist. Requires increased time for task  Transfers Transfers: Sit to Stand;Stand to Sit Sit to Stand: 4: Min assist;From elevated surface;With upper extremity assist;From bed Stand to Sit: 4: Min assist;With upper extremity assist;With armrests;To chair/3-in-1 Details for Transfer Assistance: Assist to rise and steady with cues for hand placement and LE management when sitting/standing.  Ambulation/Gait Ambulation/Gait Assistance: 1: +2 Total  assist Ambulation/Gait: Patient Percentage: 60% Ambulation Distance (Feet): 3 Feet Assistive device: Rolling walker Ambulation/Gait Assistance Details: Pt only able to take a few steps then stated she felt very dizzy and just overall looked very fatigued.  Assisted pt to chair with assist to steady and manage LLE and cues for sequencing/technique when taking steps.  Gait Pattern: Step-to pattern;Antalgic;Decreased stride length;Trunk flexed Gait velocity: decreased Stairs: No Wheelchair Mobility Wheelchair Mobility: No    Exercises     PT Diagnosis: Difficulty walking;Generalized weakness;Acute pain  PT Problem List: Decreased strength;Decreased range of motion;Decreased activity tolerance;Decreased balance;Decreased mobility;Decreased coordination;Decreased knowledge of use of DME;Decreased safety awareness;Decreased knowledge of precautions;Pain PT Treatment Interventions: DME instruction;Gait training;Stair training;Functional mobility training;Therapeutic activities;Therapeutic exercise;Balance training;Patient/family education   PT Goals Acute Rehab PT Goals PT Goal Formulation: With patient Time For Goal Achievement: 11/22/12 Potential to Achieve Goals: Good Pt will go Supine/Side to Sit: with supervision PT Goal: Supine/Side to Sit - Progress: Goal set today Pt will go Sit to Supine/Side: with supervision PT Goal: Sit to Supine/Side - Progress: Goal set today Pt will go Sit to Stand: with supervision PT Goal: Sit to Stand - Progress: Goal set today Pt will go Stand to Sit: with supervision PT Goal: Stand to Sit - Progress: Goal set today Pt will Ambulate: 51 - 150 feet;with supervision;with least restrictive assistive device PT Goal: Ambulate - Progress: Goal set today Pt will Go Up / Down Stairs: 3-5 stairs;with min assist;with least restrictive assistive device PT Goal: Up/Down Stairs - Progress: Goal set today  Visit Information  Last PT Received On: 11/18/12 Assistance  Needed: +2 (safety)    Subjective Data  Subjective: I'm just so tired Patient Stated Goal: to return home.   Prior Functioning  Home Living Lives With: Spouse;Daughter Available Help at Discharge: Family;Available 24 hours/day Type of Home: House Home Access: Stairs to enter [employer] of Steps: 3 Entrance Stairs-Rails: None Home Layout: One level Bathroom Shower/Tub: Engineer, manufacturing systems: Standard Home Adaptive Equipment: Environmental consultant - rolling Prior Function Level of Independence: Independent Able to Take Stairs?: Yes Driving: Yes Vocation: Retired Musician: No difficulties    Copywriter, advertising Arousal/Alertness: Awake/alert Behavior During Therapy: WFL for tasks assessed/performed Overall Cognitive Status: Within Functional Limits for tasks assessed    Extremity/Trunk Assessment Right Lower Extremity Assessment RLE ROM/Strength/Tone: WFL for tasks assessed RLE Sensation: WFL - Light Touch Left Lower Extremity Assessment LLE ROM/Strength/Tone: Deficits LLE ROM/Strength/Tone Deficits: ankle motion WFL, able to initiate SLR, however unable to sustain without assist from therapist.   LLE Sensation: WFL - Light Touch Trunk Assessment Trunk Assessment: Kyphotic   Balance    End of Session PT - End of Session Equipment Utilized During Treatment: Gait belt;Left knee immobilizer Activity Tolerance: Other (comment) (dizziness. ) Patient left: in chair;with call bell/phone within reach Nurse Communication: Mobility status CPM Left Knee CPM Left Knee: Off  GP     Vista Deck 11/18/2012, 10:32 AM

## 2012-11-18 NOTE — Progress Notes (Signed)
Utilization review completed.  

## 2012-11-18 NOTE — Progress Notes (Signed)
Physical Therapy Treatment Patient Details Name: [name] MRN: [id number] DOB: [birth year] Today's Date: 11/18/2012 Time: 2952-8413 PT Time Calculation (min): 26 min  PT Assessment / Plan / Recommendation Comments on Treatment Session  Pt making slow progression, but is improved from am session.  Discussed possible ST SNF and pt states she does not want SNF, but wants home.     Follow Up Recommendations  Home health PT;Supervision for mobility/OOB     Does the patient have the potential to tolerate intense rehabilitation     Barriers to Discharge        Equipment Recommendations  None recommended by PT    Recommendations for Other Services OT consult  Frequency 7X/week   Plan Discharge plan remains appropriate    Precautions / Restrictions Precautions Precautions: Knee Required Braces or Orthoses: Knee Immobilizer - Left Knee Immobilizer - Left: Discontinue once straight leg raise with < 10 degree lag Restrictions Weight Bearing Restrictions: No Other Position/Activity Restrictions: WBAT   Pertinent Vitals/Pain 6/10 with exercise, ice packs applied    Mobility  Bed Mobility Bed Mobility: Sit to Supine Sit to Supine: 4: Min assist;3: Mod assist;HOB flat Details for Bed Mobility Assistance: Assist for LLE into bed with cues for adjusting hips once in bed.   Transfers Transfers: Stand to Sit Stand to Sit: 4: Min assist;With upper extremity assist;To bed Details for Transfer Assistance: Max cues for hand placement and LE management, esp when sitting.  Ambulation/Gait Ambulation/Gait Assistance: 4: Min assist;3: Mod assist Ambulation Distance (Feet): 30 Feet Assistive device: Rolling walker Ambulation/Gait Assistance Details: Cues for sequencing/technique with RW and to maintain upright posture throughout.   Gait Pattern: Step-to pattern;Antalgic;Decreased stride length;Trunk flexed Gait velocity: decreased    Exercises Total Joint Exercises Ankle Circles/Pumps:  AROM;Both;20 reps Quad Sets: AROM;Left;10 reps Heel Slides: AAROM;Left;10 reps Hip ABduction/ADduction: AAROM;Left;10 reps Straight Leg Raises: AAROM;Left;10 reps Goniometric ROM: 35 deg of knee flex, pt very guarded with motion   PT Diagnosis:    PT Problem List:   PT Treatment Interventions:     PT Goals Acute Rehab PT Goals PT Goal Formulation: With patient Time For Goal Achievement: 11/22/12 Potential to Achieve Goals: Good Pt will go Sit to Supine/Side: with supervision PT Goal: Sit to Supine/Side - Progress: Progressing toward goal Pt will go Stand to Sit: with supervision PT Goal: Stand to Sit - Progress: Progressing toward goal Pt will Ambulate: 51 - 150 feet;with supervision;with least restrictive assistive device PT Goal: Ambulate - Progress: Progressing toward goal  Visit Information  Last PT Received On: 11/18/12 Assistance Needed: +1    Subjective Data  Subjective: I'm still so tired.  Patient Stated Goal: to return home.   Cognition  Cognition Arousal/Alertness: Awake/alert Behavior During Therapy: WFL for tasks assessed/performed Overall Cognitive Status: Within Functional Limits for tasks assessed    Balance     End of Session PT - End of Session Equipment Utilized During Treatment: Left knee immobilizer Activity Tolerance: Patient tolerated treatment well;Patient limited by fatigue;Patient limited by pain Patient left: in bed;with call bell/phone within reach Nurse Communication: Mobility status CPM Left Knee CPM Left Knee: On   GP     Vista Deck 11/18/2012, 4:03 PM

## 2012-11-18 NOTE — Progress Notes (Signed)
   Subjective: 1 Day Post-Op Procedure(s) (LRB): TOTAL KNEE ARTHROPLASTY (Left) Patient reports pain as moderate.   Much more alert this AM We will start therapy today.  Plan is to go Home after hospital stay.  Objective: Vital signs in last 24 hours: Temp:  [94.5 F (34.7 C)-98.3 F (36.8 C)] 97.4 F (36.3 C) (04/22 0627) Pulse Rate:  [57-88] 63 (04/22 0627) Resp:  [9-22] 16 (04/22 0627) BP: (118-163)/(55-90) 148/83 mmHg (04/22 0627) SpO2:  [94 %-100 %] 100 % (04/22 0627) Weight:  [182 lb (82.555 kg)] 182 lb (82.555 kg) (04/21 1630)  Intake/Output from previous day:  Intake/Output Summary (Last 24 hours) at 11/18/12 0724 Last data filed at 11/18/12 1610  Gross per 24 hour  Intake 3666.25 ml  Output   1320 ml  Net 2346.25 ml    Intake/Output this shift:    Labs:  Recent Labs  11/18/12 0455  HGB 10.2*    Recent Labs  11/18/12 0455  WBC 10.0  RBC 3.21*  HCT 29.4*  PLT 226    Recent Labs  11/18/12 0455  NA 135  K 4.3  CL 100  CO2 27  BUN 13  CREATININE 0.87  GLUCOSE 148*  CALCIUM 8.4   No results found for this basename: LABPT, INR,  in the last 72 hours  EXAM General - Patient is Alert, Appropriate and Oriented Extremity - Neurologically intact Neurovascular intact Dorsiflexion/Plantar flexion intact No cellulitis present Compartment soft Dressing - dressing C/D/I Motor Function - intact, moving foot and toes well on exam.  Hemovac pulled without difficulty.  Past Medical History  Diagnosis Date  . History of sudden visual loss   . Acute cystitis   . Bronchitis, acute   . Hypertension   . Cerebrovascular disease   . Hypothyroidism   . GERD (gastroesophageal reflux disease)   . Diverticulosis of colon   . DJD (degenerative joint disease)   . Back pain   . Transient ischemic attack   . Anxiety   . Depression   . Asthma   . COPD (chronic obstructive pulmonary disease)   . Shortness of breath     with exertion   . Pneumonia    hx of x 2   . 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 UTI (lower urinary tract infection)     hx of   . Cancer     hxof precancerous lesion on nose     Assessment/Plan: 1 Day Post-Op Procedure(s) (LRB): TOTAL KNEE ARTHROPLASTY (Left) Principal Problem:   OA (osteoarthritis) of knee   Advance diet Up with therapy D/C IV fluids Plan for discharge tomorrow Discharge home with home health  DVT Prophylaxis - Xarelto Weight-Bearing as tolerated to left leg D/C O2 and Pulse OX and try on Room Air  Lopaka Karge V 11/18/2012, 7:24 AM

## 2012-11-19 LAB — BASIC METABOLIC PANEL
BUN: 15 mg/dL (ref 6–23)
GFR calc Af Amer: 60 mL/min — ABNORMAL LOW (ref >90)
GFR calc non Af Amer: 52 mL/min — ABNORMAL LOW (ref >90)
Potassium: 4.1 mEq/L (ref 3.5–5.1)
Sodium: 133 mEq/L — ABNORMAL LOW (ref 135–145)

## 2012-11-19 LAB — CBC
MCHC: 34.8 g/dL (ref 30.0–36.0)
RDW: 13.7 % (ref 11.5–15.5)

## 2012-11-19 NOTE — Care Management Note (Signed)
    Page 1 of 2   11/19/2012     5:16:30 PM   CARE MANAGEMENT NOTE 11/19/2012  Patient:  [name], [name]   Account Number:  [id number]  Date Initiated:  11/19/2012  Documentation initiated by:  Colleen Can  Subjective/Objective Assessment:   dx osteoarthritis left knee; total knee replacemnt .    Pre-arranged with Genevieve Norlander to provide [facility] services with start date of day after discharge.     Action/Plan:   Cm spoke with patient. Plans are for patient to return to her home in [place] where spouse will be primary caregiver. She already has RW.  Genevieve Norlander will provide [facility] services upon discharge.   Anticipated DC Date:  11/20/2012   Anticipated DC Plan:  HOME W HOME HEALTH SERVICES      DC Planning Services  CM consult      PAC Choice  DURABLE MEDICAL EQUIPMENT  HOME HEALTH   Choice offered to / List presented to:  C-1 Patient   DME arranged  3-N-1      DME agency  [facility]     HH arranged  HH-2 PT      [facility] agency  [facility]   Status of service:  Completed, signed off Medicare Important Message given?  NA - LOS <3 / Initial given by admissions (If response is "NO", the following Medicare IM given date fields will be blank) Date Medicare IM given:   Date Additional Medicare IM given:    Discharge Disposition:    Per UR Regulation:    If discussed at Long Length of Stay Meetings, dates discussed:    Comments:

## 2012-11-19 NOTE — Progress Notes (Signed)
   Subjective: 2 Days Post-Op Procedure(s) (LRB): TOTAL KNEE ARTHROPLASTY (Left) Patient reports pain as mild.   Plan is to go Home after hospital stay.  Objective: Vital signs in last 24 hours: Temp:  [97.5 F (36.4 C)-98.5 F (36.9 C)] 97.5 F (36.4 C) (04/23 0600) Pulse Rate:  [77-82] 77 (04/23 0600) Resp:  [12-20] 12 (04/23 1122) BP: (125-144)/(69-79) 128/79 mmHg (04/23 0600) SpO2:  [91 %-96 %] 94 % (04/23 1122)  Intake/Output from previous day:  Intake/Output Summary (Last 24 hours) at 11/19/12 1312 Last data filed at 11/19/12 0939  Gross per 24 hour  Intake   1240 ml  Output    750 ml  Net    490 ml    Intake/Output this shift: Total I/O In: 120 [P.O.:120] Out: -   Labs:  Recent Labs  11/18/12 0455 11/19/12 0507  HGB 10.2* 9.7*    Recent Labs  11/18/12 0455 11/19/12 0507  WBC 10.0 13.1*  RBC 3.21* 3.05*  HCT 29.4* 27.9*  PLT 226 217    Recent Labs  11/18/12 0455 11/19/12 0507  NA 135 133*  K 4.3 4.1  CL 100 97  CO2 27 27  BUN 13 15  CREATININE 0.87 1.03  GLUCOSE 148* 132*  CALCIUM 8.4 9.1   No results found for this basename: LABPT, INR,  in the last 72 hours  EXAM General - Patient is Alert, Appropriate and Oriented Extremity - Neurologically intact Neurovascular intact Incision: dressing C/D/I No cellulitis present Compartment soft Dressing/Incision - clean, dry, no drainage Motor Function - intact, moving foot and toes well on exam.   Past Medical History  Diagnosis Date  . History of sudden visual loss   . Acute cystitis   . Bronchitis, acute   . Hypertension   . Cerebrovascular disease   . Hypothyroidism   . GERD (gastroesophageal reflux disease)   . Diverticulosis of colon   . DJD (degenerative joint disease)   . Back pain   . Transient ischemic attack   . Anxiety   . Depression   . Asthma   . COPD (chronic obstructive pulmonary disease)   . Shortness of breath     with exertion   . Pneumonia     hx of x 2     . 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 UTI (lower urinary tract infection)     hx of   . Cancer     hxof precancerous lesion on nose     Assessment/Plan: 2 Days Post-Op Procedure(s) (LRB): TOTAL KNEE ARTHROPLASTY (Left) Principal Problem:   OA (osteoarthritis) of knee   D/C IV fluids Discharge home with home health this afternoon or tomorrow depending on progress  DVT Prophylaxis - Xarelto Weight-Bearing as tolerated to left leg  Alania Overholt V 11/19/2012, 1:12 PM

## 2012-11-19 NOTE — Evaluation (Signed)
Occupational Therapy Evaluation Patient Details Name: [name] MRN: [id number] DOB: June 24, 1935 Today's Date: 11/19/2012 Time: 4098-1191 OT Time Calculation (min): 27 min  OT Assessment / Plan / Recommendation Clinical Impression  Pt is s/p L TKA and had the opposite knee done about 6 years ago per pt. She displays decreased strength and overall independence with ADL. Will benefit from skilled OT services to improve ADL independence.     OT Assessment  Patient needs continued OT Services    Follow Up Recommendations  Home health OT;Supervision/Assistance - 24 hour    Barriers to Discharge      Equipment Recommendations  3 in 1 bedside comode (already in room)    Recommendations for Other Services    Frequency  Min 2X/week    Precautions / Restrictions Precautions Precautions: Knee Required Braces or Orthoses: Knee Immobilizer - Left Knee Immobilizer - Left: Discontinue once straight leg raise with < 10 degree lag Restrictions Weight Bearing Restrictions: No Other Position/Activity Restrictions: WBAT        ADL  Eating/Feeding: Simulated;Independent Where Assessed - Eating/Feeding: Chair Grooming: Performed;Wash/dry hands;Minimal assistance Where Assessed - Grooming: Unsupported standing Upper Body Bathing: Simulated;Chest;Right arm;Left arm;Abdomen;Set up Where Assessed - Upper Body Bathing: Unsupported sitting Lower Body Bathing: Simulated;Minimal assistance Where Assessed - Lower Body Bathing: Supported sit to stand Upper Body Dressing: Simulated;Set up Where Assessed - Upper Body Dressing: Unsupported sitting Lower Body Dressing: Simulated;Moderate assistance Where Assessed - Lower Body Dressing: Supported sit to Pharmacist, hospital: Performed;Minimal Dentist Method: Other (comment) (into bathroom with RW) Toilet Transfer Equipment: Raised toilet seat with arms (or 3-in-1 over toilet) Toileting - Clothing Manipulation and Hygiene:  Performed;Minimal assistance Where Assessed - Engineer, mining and Hygiene: Sit to stand from 3-in-1 or toilet Equipment Used: Rolling walker ADL Comments: Husband present. Educated on how to don/doff KI and to wear until SLR. Pt states she will sponge bathe until able to get into tub. Educated on how to adjust 3in1 to appropriate height also.     OT Diagnosis: Generalized weakness  OT Problem List: Decreased strength OT Treatment Interventions: Self-care/ADL training;DME and/or AE instruction;Therapeutic activities;Patient/family education   OT Goals Acute Rehab OT Goals OT Goal Formulation: With patient Time For Goal Achievement: 11/26/12 Potential to Achieve Goals: Good ADL Goals Pt Will Perform Grooming: with supervision;Standing at sink ADL Goal: Grooming - Progress: Goal set today Pt Will Perform Lower Body Bathing: Sit to stand from bed;Sit to stand in shower;with min assist ADL Goal: Lower Body Bathing - Progress: Goal set today Pt Will Perform Lower Body Dressing: with min assist;Sit to stand from chair;Sit to stand from bed ADL Goal: Lower Body Dressing - Progress: Goal set today Pt Will Transfer to Toilet: with supervision;Ambulation;3-in-1 ADL Goal: Toilet Transfer - Progress: Goal set today Pt Will Perform Toileting - Clothing Manipulation: with supervision;Standing ADL Goal: Toileting - Clothing Manipulation - Progress: Goal set today  Visit Information  Last OT Received On: 11/19/12 Assistance Needed: +1    Subjective Data  Subjective: I did real good earlier going to the bathroom Patient Stated Goal: agreeable to OT; none stated   Prior Functioning     Home Living Lives With: Spouse;Daughter Available Help at Discharge: Family;Available 24 hours/day Type of Home: House Home Access: Stairs to enter [employer] of Steps: 3 Entrance Stairs-Rails: None Home Layout: One level Bathroom Shower/Tub: Engineer, manufacturing systems:  Standard Home Adaptive Equipment: Walker - rolling Prior Function Level of Independence: Independent Able to Take  Stairs?: Yes Driving: Yes Vocation: Retired Musician: No difficulties         Vision/Perception     Copywriter, advertising Arousal/Alertness: Awake/alert Overall Cognitive Status: Within Functional Limits for tasks assessed    Extremity/Trunk Assessment Right Upper Extremity Assessment RUE ROM/Strength/Tone: WFL for tasks assessed Left Upper Extremity Assessment LUE ROM/Strength/Tone: WFL for tasks assessed     Mobility Bed Mobility Bed Mobility: Supine to Sit Supine to Sit: 4: Min assist;HOB elevated Transfers Transfers: Sit to Stand;Stand to Sit Sit to Stand: 4: Min assist;With upper extremity assist;From bed;From chair/3-in-1 Stand to Sit: 4: Min assist;With upper extremity assist;To chair/3-in-1 Details for Transfer Assistance: min verbal cues for hand placement,  L LE management and to step all the way back to chair before sitting.     Exercise     Balance Balance Balance Assessed: Yes Dynamic Standing Balance Dynamic Standing - Level of Assistance: 4: Min assist   End of Session OT - End of Session Activity Tolerance: Patient tolerated treatment well Patient left: in chair;with call bell/phone within reach;with family/visitor present  GO     Lennox Laity [phone] 11/19/2012, 11:05 AM

## 2012-11-19 NOTE — Progress Notes (Signed)
Pt assessment unchanged from this am; pt DC'd per MD order.  Reviewed DC instructions and meds with pt.  Pt husband to transport home.

## 2012-11-19 NOTE — Progress Notes (Signed)
Physical Therapy Treatment Patient Details Name: [name] MRN: [id number] DOB: 03/14/1935 Today's Date: 11/19/2012 Time: 1324-4010 PT Time Calculation (min): 21 min  PT Assessment / Plan / Recommendation Comments on Treatment Session  Pt doing much better during am session.  Will see for pm session for stair training to determine safety for hopeful D/C home today.     Follow Up Recommendations  Home health PT;Supervision for mobility/OOB     Does the patient have the potential to tolerate intense rehabilitation     Barriers to Discharge        Equipment Recommendations  None recommended by PT    Recommendations for Other Services    Frequency 7X/week   Plan Discharge plan remains appropriate    Precautions / Restrictions Precautions Precautions: Knee Required Braces or Orthoses: Knee Immobilizer - Left Knee Immobilizer - Left: Discontinue once straight leg raise with < 10 degree lag Restrictions Weight Bearing Restrictions: No Other Position/Activity Restrictions: WBAT   Pertinent Vitals/Pain 4/10, ice packs applied    Mobility  Bed Mobility Bed Mobility: Sit to Supine Supine to Sit: 4: Min assist;HOB elevated Sit to Supine: 4: Min assist;HOB flat Details for Bed Mobility Assistance: Assist for LLE into bed with cues for adjusting hips once in bed.   Transfers Transfers: Sit to Stand;Stand to Sit Sit to Stand: 4: Min guard;With upper extremity assist;With armrests;From chair/3-in-1 Stand to Sit: 4: Min guard;With upper extremity assist;To bed Details for Transfer Assistance: Min/gaurd for safety with cues for hand placement and LE management.  Ambulation/Gait Ambulation/Gait Assistance: 4: Min guard Ambulation Distance (Feet): 95 Feet Assistive device: Rolling walker Ambulation/Gait Assistance Details: Min cues for maintaining position inside of RW, however pt with marked improvement today vs yesterday.  Gait Pattern: Step-to pattern;Antalgic;Decreased stride  length;Trunk flexed Gait velocity: decreased    Exercises     PT Diagnosis:    PT Problem List:   PT Treatment Interventions:     PT Goals Acute Rehab PT Goals PT Goal Formulation: With patient Time For Goal Achievement: 11/22/12 Potential to Achieve Goals: Good Pt will go Sit to Supine/Side: with supervision PT Goal: Sit to Supine/Side - Progress: Progressing toward goal Pt will go Sit to Stand: with supervision PT Goal: Sit to Stand - Progress: Progressing toward goal Pt will go Stand to Sit: with supervision PT Goal: Stand to Sit - Progress: Progressing toward goal Pt will Ambulate: 51 - 150 feet;with supervision;with least restrictive assistive device PT Goal: Ambulate - Progress: Progressing toward goal  Visit Information  Last PT Received On: 11/19/12 Assistance Needed: +1    Subjective Data  Subjective: I'm doing better today  Patient Stated Goal: to return home.   Cognition  Cognition Arousal/Alertness: Awake/alert Behavior During Therapy: WFL for tasks assessed/performed Overall Cognitive Status: Within Functional Limits for tasks assessed    Balance  Balance Balance Assessed: Yes Dynamic Standing Balance Dynamic Standing - Level of Assistance: 4: Min assist  End of Session PT - End of Session Equipment Utilized During Treatment: Left knee immobilizer Activity Tolerance: Patient tolerated treatment well;Patient limited by fatigue Patient left: in bed;with call bell/phone within reach Nurse Communication: Mobility status   GP     Vista Deck 11/19/2012, 1:25 PM

## 2012-11-19 NOTE — Progress Notes (Signed)
Spoke with person at phone number [phone]. They stated patient was in the hospital.

## 2012-11-19 NOTE — Progress Notes (Signed)
Notes in Epic indicate pt had knee surgery 11/17/12.

## 2012-11-19 NOTE — Clinical Documentation Improvement (Signed)
Anemia Blood Loss Clarification  THIS DOCUMENT IS NOT A PERMANENT PART OF THE MEDICAL RECORD  RESPOND TO THE THIS QUERY, FOLLOW THE INSTRUCTIONS BELOW:  1. If needed, update documentation for the patient's encounter via the notes activity.  2. Access this query again and click edit on the In [employer].  3. After updating, or not, click F2 to complete all highlighted (required) fields concerning your review. Select "additional documentation in the medical record" OR "no additional documentation provided".  4. Click Sign note button.  5. The deficiency will fall out of your In Basket *Please let us know if you are not able to complete this workflow by phone or e-mail (listed below).        11/19/12  Dear [name]/Associates  In an effort to better capture your patient's severity of illness, reflect appropriate length of stay and utilization of resources, a review of the patient medical record has revealed the following indicators.    Based on your clinical judgment, please clarify and document in a progress note and/or discharge summary the clinical condition associated with the following supporting information:  In responding to this query please exercise your independent judgment.  The fact that a query is asked, does not imply that any particular answer is desired or expected.  Pt Post Op H/H=9.7/10.2   Clarification Needed   Please clarify if Post Op H/H can be further specified as one of the diagnoses listed below and document in pn or d/c summary.     Possible Clinical Conditions?   " Expected Acute Blood Loss Anemia  " Acute Blood Loss Anemia " Acute on chronic blood loss anemia  " Chronic blood loss anemia  " Precipitous drop in Hematocrit  " Other Condition________________  " Cannot Clinically Determine  Risk Factors: (recent surgery, pre op anemia, EBL in OR)  Supporting Information:  OA, L TKR. Signs and Symptoms  Abnormal  H/H  Diagnostics: Component     Latest Ref Rng 11/18/2012 11/19/2012  Hemoglobin     12.0 - 15.0 g/dL 54.0 (L) 9.7 (L)  HCT     36.0 - 46.0 % 29.4 (L) 27.9 (L)   Treatments:  Serial H&H monitoring   Reviewed:  no additional documentation provided ljh   Thank You,  Enis Slipper  RN, BSN, MSN/Inf, CCDS Clinical Documentation Specialist [facility] Dept Pager: [phone]  Health Information Management [place]

## 2012-11-19 NOTE — Progress Notes (Signed)
Physical Therapy Treatment Patient Details Name: [name] MRN: [id number] DOB: 07-Mar-1935 Today's Date: 11/19/2012 Time: 4098-1191 PT Time Calculation (min): 43 min  PT Assessment / Plan / Recommendation Comments on Treatment Session  Pt continues to progress very well and has performed stairs safely.  Ready for D/C    Follow Up Recommendations  Home health PT;Supervision for mobility/OOB     Does the patient have the potential to tolerate intense rehabilitation     Barriers to Discharge        Equipment Recommendations  None recommended by PT    Recommendations for Other Services    Frequency 7X/week   Plan Discharge plan remains appropriate    Precautions / Restrictions Precautions Precautions: Knee Required Braces or Orthoses: Knee Immobilizer - Left Knee Immobilizer - Left: Discontinue once straight leg raise with < 10 degree lag Restrictions Weight Bearing Restrictions: No Other Position/Activity Restrictions: WBAT   Pertinent Vitals/Pain     Mobility  Bed Mobility Bed Mobility: Supine to Sit Supine to Sit: 4: Min assist;HOB flat Sit to Supine: 4: Min assist;HOB flat Details for Bed Mobility Assistance: Assist for LLE out of bed with cues for hand placement on bed to better simulate home environment.  Transfers Transfers: Sit to Stand;Stand to Sit Sit to Stand: 4: Min guard;With upper extremity assist;From bed;From chair/3-in-1 Stand to Sit: 4: Min guard;With upper extremity assist;With armrests;To chair/3-in-1 Details for Transfer Assistance: Min/guard for safety with cues for hand placement  Ambulation/Gait Ambulation/Gait Assistance: 4: Min guard Ambulation Distance (Feet): 200 Feet Assistive device: Rolling walker Ambulation/Gait Assistance Details: Min cues for maintaining position inside of RW Gait Pattern: Step-to pattern;Antalgic;Decreased stride length;Trunk flexed Gait velocity: decreased Stairs: Yes Stairs Assistance: 4: Min guard;4: Min  assist Stairs Assistance Details (indicate cue type and reason): Cues for sequencing/technique with RW and how to tell husband to assist her.  Stair Management Technique: No rails;Step to pattern;Backwards;Forwards;With walker Number of Stairs: 4    Exercises Total Joint Exercises Ankle Circles/Pumps: AROM;Both;20 reps Quad Sets: AROM;Left;10 reps Heel Slides: AAROM;Left;10 reps Hip ABduction/ADduction: AAROM;Left;10 reps Straight Leg Raises: AAROM;Left;10 reps   PT Diagnosis:    PT Problem List:   PT Treatment Interventions:     PT Goals Acute Rehab PT Goals PT Goal Formulation: With patient Time For Goal Achievement: 11/22/12 Potential to Achieve Goals: Good Pt will go Supine/Side to Sit: with supervision PT Goal: Supine/Side to Sit - Progress: Progressing toward goal Pt will go Sit to Supine/Side: with supervision PT Goal: Sit to Supine/Side - Progress: Progressing toward goal Pt will go Sit to Stand: with supervision PT Goal: Sit to Stand - Progress: Progressing toward goal Pt will go Stand to Sit: with supervision PT Goal: Stand to Sit - Progress: Progressing toward goal Pt will Ambulate: 51 - 150 feet;with supervision;with least restrictive assistive device PT Goal: Ambulate - Progress: Partly met Pt will Go Up / Down Stairs: 3-5 stairs;with min assist;with least restrictive assistive device PT Goal: Up/Down Stairs - Progress: Met  Visit Information  Last PT Received On: 11/19/12 Assistance Needed: +1    Subjective Data  Subjective: Are you going to get me again.  Patient Stated Goal: to return home.   Cognition  Cognition Arousal/Alertness: Awake/alert Behavior During Therapy: WFL for tasks assessed/performed Overall Cognitive Status: Within Functional Limits for tasks assessed    Balance     End of Session PT - End of Session Equipment Utilized During Treatment: Left knee immobilizer Activity Tolerance: Patient tolerated treatment well;Patient  limited by  fatigue Patient left: in chair;with call bell/phone within reach Nurse Communication: Mobility status CPM Left Knee CPM Left Knee: Off   GP     Vista Deck 11/19/2012, 4:01 PM

## 2012-11-20 DIAGNOSIS — Z471 Aftercare following joint replacement surgery: Secondary | ICD-10-CM | POA: Diagnosis not present

## 2012-11-20 DIAGNOSIS — IMO0001 Reserved for inherently not codable concepts without codable children: Secondary | ICD-10-CM | POA: Diagnosis not present

## 2012-11-20 DIAGNOSIS — I69998 Other sequelae following unspecified cerebrovascular disease: Secondary | ICD-10-CM | POA: Diagnosis not present

## 2012-11-20 DIAGNOSIS — H547 Unspecified visual loss: Secondary | ICD-10-CM | POA: Diagnosis not present

## 2012-11-20 DIAGNOSIS — Z96659 Presence of unspecified artificial knee joint: Secondary | ICD-10-CM | POA: Diagnosis not present

## 2012-11-20 DIAGNOSIS — H539 Unspecified visual disturbance: Secondary | ICD-10-CM | POA: Diagnosis not present

## 2012-11-21 DIAGNOSIS — Z471 Aftercare following joint replacement surgery: Secondary | ICD-10-CM | POA: Diagnosis not present

## 2012-11-21 DIAGNOSIS — Z96659 Presence of unspecified artificial knee joint: Secondary | ICD-10-CM | POA: Diagnosis not present

## 2012-11-21 DIAGNOSIS — H539 Unspecified visual disturbance: Secondary | ICD-10-CM | POA: Diagnosis not present

## 2012-11-21 DIAGNOSIS — H547 Unspecified visual loss: Secondary | ICD-10-CM | POA: Diagnosis not present

## 2012-11-21 DIAGNOSIS — IMO0001 Reserved for inherently not codable concepts without codable children: Secondary | ICD-10-CM | POA: Diagnosis not present

## 2012-11-24 DIAGNOSIS — IMO0001 Reserved for inherently not codable concepts without codable children: Secondary | ICD-10-CM | POA: Diagnosis not present

## 2012-11-24 DIAGNOSIS — I69998 Other sequelae following unspecified cerebrovascular disease: Secondary | ICD-10-CM | POA: Diagnosis not present

## 2012-11-24 DIAGNOSIS — Z96659 Presence of unspecified artificial knee joint: Secondary | ICD-10-CM | POA: Diagnosis not present

## 2012-11-24 DIAGNOSIS — Z471 Aftercare following joint replacement surgery: Secondary | ICD-10-CM | POA: Diagnosis not present

## 2012-11-24 DIAGNOSIS — H547 Unspecified visual loss: Secondary | ICD-10-CM | POA: Diagnosis not present

## 2012-11-25 DIAGNOSIS — H539 Unspecified visual disturbance: Secondary | ICD-10-CM | POA: Diagnosis not present

## 2012-11-25 DIAGNOSIS — IMO0001 Reserved for inherently not codable concepts without codable children: Secondary | ICD-10-CM | POA: Diagnosis not present

## 2012-11-25 DIAGNOSIS — Z471 Aftercare following joint replacement surgery: Secondary | ICD-10-CM | POA: Diagnosis not present

## 2012-11-25 DIAGNOSIS — Z96659 Presence of unspecified artificial knee joint: Secondary | ICD-10-CM | POA: Diagnosis not present

## 2012-11-25 DIAGNOSIS — H547 Unspecified visual loss: Secondary | ICD-10-CM | POA: Diagnosis not present

## 2012-11-26 DIAGNOSIS — IMO0001 Reserved for inherently not codable concepts without codable children: Secondary | ICD-10-CM | POA: Diagnosis not present

## 2012-11-26 DIAGNOSIS — H539 Unspecified visual disturbance: Secondary | ICD-10-CM | POA: Diagnosis not present

## 2012-11-26 DIAGNOSIS — Z471 Aftercare following joint replacement surgery: Secondary | ICD-10-CM | POA: Diagnosis not present

## 2012-11-26 DIAGNOSIS — H547 Unspecified visual loss: Secondary | ICD-10-CM | POA: Diagnosis not present

## 2012-11-26 DIAGNOSIS — Z96659 Presence of unspecified artificial knee joint: Secondary | ICD-10-CM | POA: Diagnosis not present

## 2012-11-27 ENCOUNTER — Telehealth: Payer: Self-pay | Admitting: Pulmonary Disease

## 2012-11-27 DIAGNOSIS — Z96659 Presence of unspecified artificial knee joint: Secondary | ICD-10-CM | POA: Diagnosis not present

## 2012-11-27 DIAGNOSIS — Z471 Aftercare following joint replacement surgery: Secondary | ICD-10-CM | POA: Diagnosis not present

## 2012-11-27 DIAGNOSIS — H547 Unspecified visual loss: Secondary | ICD-10-CM | POA: Diagnosis not present

## 2012-11-27 DIAGNOSIS — I69998 Other sequelae following unspecified cerebrovascular disease: Secondary | ICD-10-CM | POA: Diagnosis not present

## 2012-11-27 DIAGNOSIS — IMO0001 Reserved for inherently not codable concepts without codable children: Secondary | ICD-10-CM | POA: Diagnosis not present

## 2012-11-27 MED ORDER — OMEPRAZOLE 20 MG PO CPDR: 20.0000 mg | DELAYED_RELEASE_CAPSULE | Freq: Every day | ORAL | Status: DC

## 2012-11-27 MED ORDER — LEVOTHYROXINE SODIUM 100 MCG PO TABS: 100.0000 ug | ORAL_TABLET | Freq: Every day | ORAL | Status: DC

## 2012-11-27 MED ORDER — LOSARTAN POTASSIUM 50 MG PO TABS: 50.0000 mg | ORAL_TABLET | Freq: Every day | ORAL | Status: DC

## 2012-11-27 NOTE — Telephone Encounter (Signed)
Spoke with pt She is requesting 90 day supply rxs to be mailed to her to take to the [place] I advised we will only refill meds that SN prescribes, will need to call her other doc for premarin rx  She verbalized understanding Rxs were printed and placed on SN's cart with addressed envelope  Please mail when signed

## 2012-11-27 NOTE — Telephone Encounter (Signed)
(  continued) ...replacement surgery & just isn't up to making numerous phone calls.  Pt states that "something just aint right w/ her head right now".  Would like to p/u the written Rx's today. Please advise.  Thanks!  Antionette Fairy

## 2012-11-27 NOTE — Telephone Encounter (Signed)
rx have been signed by SN and placed in the mail for the pt. thanks

## 2012-11-28 DIAGNOSIS — H547 Unspecified visual loss: Secondary | ICD-10-CM | POA: Diagnosis not present

## 2012-11-28 DIAGNOSIS — H539 Unspecified visual disturbance: Secondary | ICD-10-CM | POA: Diagnosis not present

## 2012-11-28 DIAGNOSIS — Z471 Aftercare following joint replacement surgery: Secondary | ICD-10-CM | POA: Diagnosis not present

## 2012-11-28 DIAGNOSIS — IMO0001 Reserved for inherently not codable concepts without codable children: Secondary | ICD-10-CM | POA: Diagnosis not present

## 2012-11-28 DIAGNOSIS — Z96659 Presence of unspecified artificial knee joint: Secondary | ICD-10-CM | POA: Diagnosis not present

## 2012-11-28 DIAGNOSIS — I69998 Other sequelae following unspecified cerebrovascular disease: Secondary | ICD-10-CM | POA: Diagnosis not present

## 2012-12-01 DIAGNOSIS — Z96659 Presence of unspecified artificial knee joint: Secondary | ICD-10-CM | POA: Diagnosis not present

## 2012-12-01 DIAGNOSIS — IMO0001 Reserved for inherently not codable concepts without codable children: Secondary | ICD-10-CM | POA: Diagnosis not present

## 2012-12-01 DIAGNOSIS — H539 Unspecified visual disturbance: Secondary | ICD-10-CM | POA: Diagnosis not present

## 2012-12-01 DIAGNOSIS — Z471 Aftercare following joint replacement surgery: Secondary | ICD-10-CM | POA: Diagnosis not present

## 2012-12-01 DIAGNOSIS — H547 Unspecified visual loss: Secondary | ICD-10-CM | POA: Diagnosis not present

## 2012-12-02 NOTE — Discharge Summary (Signed)
Physician Discharge Summary   Patient ID: [name] MRN: [id number] DOB/AGE: 76-May-1936 76 y.o.  Admit date: 11/17/2012 Discharge date: 11/19/2012  Primary Diagnosis:  Osteoarthritis Left knee  Admission Diagnoses:  Past Medical History  Diagnosis Date  . History of sudden visual loss   . Acute cystitis   . Bronchitis, acute   . Hypertension   . Cerebrovascular disease   . Hypothyroidism   . GERD (gastroesophageal reflux disease)   . Diverticulosis of colon   . DJD (degenerative joint disease)   . Back pain   . Transient ischemic attack   . Anxiety   . Depression   . Asthma   . COPD (chronic obstructive pulmonary disease)   . Shortness of breath     with exertion   . Pneumonia     hx of x 2   . 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 UTI (lower urinary tract infection)     hx of   . Cancer     hxof precancerous lesion on nose    Discharge Diagnoses:   Principal Problem:   OA (osteoarthritis) of knee  Estimated body mass index is 30.29 kg/(m^2) as calculated from the following:   Height as of this encounter: 5\' 5"  (1.651 m).   Weight as of this encounter: 82.555 kg (182 lb).  Procedure:  Procedure(s) (LRB): TOTAL KNEE ARTHROPLASTY (Left)   Consults: None  HPI: [name] is a 78 y.o. year old female with end stage OA of her left knee with progressively worsening pain and dysfunction. She has constant pain, with activity and at rest and significant functional deficits with difficulties even with ADLs. She has had extensive non-op management including analgesics, injections of cortisone and viscosupplements, and home exercise program, but remains in significant pain with significant dysfunction. Radiographs show bone on bone arthritis medial and patellofemoral. She presents now for left Total Knee Arthroplasty.   Laboratory Data: Admission on 11/17/2012, Discharged on 11/19/2012  Component Date Value Range Status  . WBC 11/18/2012 10.0  4.0 - 10.5  K/uL Final  . RBC 11/18/2012 3.21* 3.87 - 5.11 MIL/uL Final  . Hemoglobin 11/18/2012 10.2* 12.0 - 15.0 g/dL Final  . HCT 30/86/5784 29.4* 36.0 - 46.0 % Final  . MCV 11/18/2012 91.6  78.0 - 100.0 fL Final  . MCH 11/18/2012 31.8  26.0 - 34.0 pg Final  . MCHC 11/18/2012 34.7  30.0 - 36.0 g/dL Final  . RDW 69/62/9528 13.5  11.5 - 15.5 % Final  . Platelets 11/18/2012 226  150 - 400 K/uL Final  . Sodium 11/18/2012 135  135 - 145 mEq/L Final  . Potassium 11/18/2012 4.3  3.5 - 5.1 mEq/L Final  . Chloride 11/18/2012 100  96 - 112 mEq/L Final  . CO2 11/18/2012 27  19 - 32 mEq/L Final  . Glucose, Bld 11/18/2012 148* 70 - 99 mg/dL Final  . BUN 41/32/4401 13  6 - 23 mg/dL Final  . Creatinine, Ser 11/18/2012 0.87  0.50 - 1.10 mg/dL Final  . Calcium 02/72/5366 8.4  8.4 - 10.5 mg/dL Final  . GFR calc non Af Amer 11/18/2012 64* >90 mL/min Final  . GFR calc Af Amer 11/18/2012 74* >90 mL/min Final   Comment:                                 The eGFR has been calculated  using the CKD EPI equation.                          This calculation has not been                          validated in all clinical                          situations.                          eGFR's persistently                          <90 mL/min signify                          possible Chronic Kidney Disease.  . WBC 11/19/2012 13.1* 4.0 - 10.5 K/uL Final  . RBC 11/19/2012 3.05* 3.87 - 5.11 MIL/uL Final  . Hemoglobin 11/19/2012 9.7* 12.0 - 15.0 g/dL Final  . HCT 16/04/9603 27.9* 36.0 - 46.0 % Final  . MCV 11/19/2012 91.5  78.0 - 100.0 fL Final  . MCH 11/19/2012 31.8  26.0 - 34.0 pg Final  . MCHC 11/19/2012 34.8  30.0 - 36.0 g/dL Final  . RDW 54/03/8118 13.7  11.5 - 15.5 % Final  . Platelets 11/19/2012 217  150 - 400 K/uL Final  . Sodium 11/19/2012 133* 135 - 145 mEq/L Final  . Potassium 11/19/2012 4.1  3.5 - 5.1 mEq/L Final  . Chloride 11/19/2012 97  96 - 112 mEq/L Final  . CO2 11/19/2012 27  19 - 32  mEq/L Final  . Glucose, Bld 11/19/2012 132* 70 - 99 mg/dL Final  . BUN 14/78/2956 15  6 - 23 mg/dL Final  . Creatinine, Ser 11/19/2012 1.03  0.50 - 1.10 mg/dL Final  . Calcium 21/30/8657 9.1  8.4 - 10.5 mg/dL Final  . GFR calc non Af Amer 11/19/2012 52* >90 mL/min Final  . GFR calc Af Amer 11/19/2012 60* >90 mL/min Final   Comment:                                 The eGFR has been calculated                          using the CKD EPI equation.                          This calculation has not been                          validated in all clinical                          situations.                          eGFR's persistently                          <90 mL/min signify  possible Chronic Kidney Disease.  Hospital Outpatient Visit on 11/10/2012  Component Date Value Range Status  . aPTT 11/10/2012 28  24 - 37 seconds Final  . WBC 11/10/2012 6.2  4.0 - 10.5 K/uL Final  . RBC 11/10/2012 3.86* 3.87 - 5.11 MIL/uL Final  . Hemoglobin 11/10/2012 12.0  12.0 - 15.0 g/dL Final  . HCT 14/78/2956 35.6* 36.0 - 46.0 % Final  . MCV 11/10/2012 92.2  78.0 - 100.0 fL Final  . MCH 11/10/2012 31.1  26.0 - 34.0 pg Final  . MCHC 11/10/2012 33.7  30.0 - 36.0 g/dL Final  . RDW 21/30/8657 13.6  11.5 - 15.5 % Final  . Platelets 11/10/2012 327  150 - 400 K/uL Final  . Sodium 11/10/2012 141  135 - 145 mEq/L Final  . Potassium 11/10/2012 4.1  3.5 - 5.1 mEq/L Final  . Chloride 11/10/2012 103  96 - 112 mEq/L Final  . CO2 11/10/2012 28  19 - 32 mEq/L Final  . Glucose, Bld 11/10/2012 98  70 - 99 mg/dL Final  . BUN 84/69/6295 15  6 - 23 mg/dL Final  . Creatinine, Ser 11/10/2012 0.95  0.50 - 1.10 mg/dL Final  . Calcium 28/41/3244 9.6  8.4 - 10.5 mg/dL Final  . Total Protein 11/10/2012 7.4  6.0 - 8.3 g/dL Final  . Albumin 08/01/7251 3.1* 3.5 - 5.2 g/dL Final  . AST 66/44/0347 12  0 - 37 U/L Final  . ALT 11/10/2012 6  0 - 35 U/L Final  . Alkaline Phosphatase 11/10/2012 61  39 - 117 U/L  Final  . Total Bilirubin 11/10/2012 0.3  0.3 - 1.2 mg/dL Final  . GFR calc non Af Amer 11/10/2012 57* >90 mL/min Final  . GFR calc Af Amer 11/10/2012 66* >90 mL/min Final   Comment:                                 The eGFR has been calculated                          using the CKD EPI equation.                          This calculation has not been                          validated in all clinical                          situations.                          eGFR's persistently                          <90 mL/min signify                          possible Chronic Kidney Disease.  Marland Kitchen Prothrombin Time 11/10/2012 12.2  11.6 - 15.2 seconds Final  . INR 11/10/2012 0.91  0.00 - 1.49 Final  . ABO/RH(D) 11/10/2012 AB POS   Final  . Antibody Screen 11/10/2012 NEG   Final  . Sample Expiration 11/10/2012 11/20/2012   Final  . Color, Urine 11/10/2012 YELLOW  YELLOW Final  . APPearance 11/10/2012 CLOUDY* CLEAR Final  . Specific Gravity, Urine 11/10/2012 1.020  1.005 - 1.030 Final  . pH 11/10/2012 5.5  5.0 - 8.0 Final  . Glucose, UA 11/10/2012 NEGATIVE  NEGATIVE mg/dL Final  . Hgb urine dipstick 11/10/2012 NEGATIVE  NEGATIVE Final  . Bilirubin Urine 11/10/2012 NEGATIVE  NEGATIVE Final  . Ketones, ur 11/10/2012 NEGATIVE  NEGATIVE mg/dL Final  . Protein, ur 96/10/5407 NEGATIVE  NEGATIVE mg/dL Final  . Urobilinogen, UA 11/10/2012 0.2  0.0 - 1.0 mg/dL Final  . Nitrite 81/19/1478 POSITIVE* NEGATIVE Final  . Leukocytes, UA 11/10/2012 MODERATE* NEGATIVE Final  . MRSA, PCR 11/10/2012 NEGATIVE  NEGATIVE Final  . Staphylococcus aureus 11/10/2012 NEGATIVE  NEGATIVE Final   Comment:                                 The Xpert SA Assay (FDA                          approved for NASAL specimens                          in patients over 23 years of age),                          is one component of                          a comprehensive surveillance                          program.  Test performance has                           been validated by [employer] for patients greater                          than or equal to 87 year old.                          It is not intended                          to diagnose infection nor to                          guide or monitor treatment.  Marland Kitchen Specimen Description 11/10/2012 URINE, RANDOM   Final  . Special Requests 11/10/2012 NONE   Final  . Culture  Setup Time 11/10/2012 11/10/2012 15:40   Final  . Colony Count 11/10/2012 >=100,000 COLONIES/ML   Final  . Culture 11/10/2012 ESCHERICHIA COLI   Final  . Report Status 11/10/2012 11/12/2012 FINAL   Final  . Organism ID, Bacteria 11/10/2012 ESCHERICHIA COLI   Final  . Squamous Epithelial / LPF 11/10/2012 RARE  RARE Final  . WBC, UA 11/10/2012 7-10  <3 WBC/hpf Final  . RBC / HPF 11/10/2012 0-2  <3 RBC/hpf Final  . Bacteria, UA 11/10/2012  MANY* RARE Final     X-Rays:Dg Chest 2 View  11/10/2012  *RADIOLOGY REPORT*  Clinical Data: Preop for left knee replacement, hypertension  CHEST - 2 VIEW  Comparison: 11/03/11  Findings:  Cardiomediastinal silhouette is stable.  No acute infiltrate or pulmonary edema.  Minimal degenerative changes mid thoracic spine.  Stable left basilar scarring.  IMPRESSION: No active disease.  No significant change.   Original Report Authenticated By: Natasha Mead, M.D.     EKG: Orders placed in visit on 11/04/12  . EKG 12-LEAD     Hospital Course: [name] is a 78 y.o. who was admitted to [facility]. They were brought to the operating room on 11/17/2012 and underwent Procedure(s): TOTAL KNEE ARTHROPLASTY.  Patient tolerated the procedure well and was later transferred to the recovery room and then to the orthopaedic floor for postoperative care.  They were given PO and IV analgesics for pain control following their surgery.  They were given 24 hours of postoperative antibiotics of  Anti-infectives   Start     Dose/Rate Route Frequency Ordered  Stop   11/18/12 0200  vancomycin (VANCOCIN) IVPB 1000 mg/200 mL premix     1,000 mg 200 mL/hr over 60 Minutes Intravenous Every 12 hours 11/17/12 1639 11/18/12 0332   11/17/12 1034  vancomycin (VANCOCIN) 1,500 mg in sodium chloride 0.9 % 500 mL IVPB     1,500 mg 250 mL/hr over 120 Minutes Intravenous On call to O.R. 11/17/12 1034 11/17/12 1315     and started on DVT prophylaxis in the form of Xarelto.   PT and OT were ordered for total joint protocol.  Discharge planning consulted to help with postop disposition and equipment needs.  Patient had a decent night on the evening of surgery.  They started to get up OOB with therapy on day one. Hemovac drain was pulled without difficulty.  Continued to work with therapy into day two.  Dressing was changed on day two and the incision was healing well.   Patient was seen in rounds and was ready to go home later that afternoon.   Discharge Medications: Prior to Admission medications   Medication Sig Start Date End Date Taking? Authorizing Provider  albuterol (PROVENTIL HFA;VENTOLIN HFA) 108 (90 BASE) MCG/ACT inhaler Inhale 2 puffs into the lungs every 6 (six) hours as needed. For shortness of breath   Yes Historical Provider, MD  aspirin 81 MG tablet Take 81 mg by mouth daily.    Historical Provider, MD  atorvastatin (LIPITOR) 20 MG tablet Take 1 tablet (20 mg total) by mouth daily. 11/04/12   Laurey Morale, MD  Calcium Carbonate-Vit D-Min 600-400 MG-UNIT TABS Take 2 tablets by mouth daily.     Historical Provider, MD  hydrochlorothiazide (HYDRODIURIL) 25 MG tablet Take 1 tablet (25 mg total) by mouth daily. 11/04/12   Laurey Morale, MD  levothyroxine (SYNTHROID, LEVOTHROID) 100 MCG tablet Take 1 tablet (100 mcg total) by mouth daily before breakfast. 11/27/12   Michele Mcalpine, MD  losartan (COZAAR) 50 MG tablet Take 1 tablet (50 mg total) by mouth daily before breakfast. 11/27/12   Michele Mcalpine, MD  methocarbamol (ROBAXIN) 500 MG tablet Take 1 tablet (500  mg total) by mouth every 6 (six) hours as needed. 11/18/12   Loanne Drilling, MD  omeprazole (PRILOSEC) 20 MG capsule Take 1 capsule (20 mg total) by mouth daily. 11/27/12   Michele Mcalpine, MD  oxyCODONE (OXY IR/ROXICODONE) 5 MG immediate release  tablet Take 1-2 tablets (5-10 mg total) by mouth every 3 (three) hours as needed. 11/18/12   Loanne Drilling, MD  potassium chloride (K-DUR) 10 MEQ tablet Take 1 tablet (10 mEq total) by mouth daily. 11/04/12   Laurey Morale, MD  rivaroxaban (XARELTO) 10 MG TABS tablet Take 1 tablet (10 mg total) by mouth daily with breakfast. 11/18/12   Loanne Drilling, MD  traMADol (ULTRAM) 50 MG tablet Take 1-2 tablets (50-100 mg total) by mouth every 6 (six) hours as needed. 11/18/12   Loanne Drilling, MD    Diet: Cardiac diet Activity: WBAT Follow-up:in 2 weeks Disposition - Home Discharged Condition: good    Future Appointments Provider Department Dept Phone   01/05/2013 10:10 AM [facility] Main [facility] [place]) [phone]   02/24/2013 10:30 AM Michele Mcalpine, MD [facility] [phone]   03/10/2013 1:00 PM [facility] [phone]   03/10/2013 2:00 PM Evern Bio, NP [facility] [phone]   05/13/2013 2:30 PM Sherrie George, MD [facility] [phone]       Medication List    STOP taking these medications       ALEVE 220 MG Caps  Generic drug:  Naproxen Sodium     AZO CRANBERRY 250-30 MG Tabs     bisacodyl 5 MG EC tablet  Commonly known as:  DULCOLAX     diclofenac sodium 1 % Gel  Commonly known as:  VOLTAREN     estrogens (conjugated) 0.9 MG tablet  Commonly known as:  PREMARIN     fish oil-omega-3 fatty acids 1000 MG capsule     HYDROcodone-acetaminophen 5-500 MG per tablet  Commonly known as:  VICODIN     OSTEO BI-FLEX ADV DOUBLE ST PO      TAKE these medications       albuterol 108 (90 BASE) MCG/ACT  inhaler  Commonly known as:  PROVENTIL HFA;VENTOLIN HFA  Inhale 2 puffs into the lungs every 6 (six) hours as needed. For shortness of breath     aspirin 81 MG tablet  Take 81 mg by mouth daily.     atorvastatin 20 MG tablet  Commonly known as:  LIPITOR  Take 1 tablet (20 mg total) by mouth daily.     Calcium Carbonate-Vit D-Min 600-400 MG-UNIT Tabs  Take 2 tablets by mouth daily.     hydrochlorothiazide 25 MG tablet  Commonly known as:  HYDRODIURIL  Take 1 tablet (25 mg total) by mouth daily.     methocarbamol 500 MG tablet  Commonly known as:  ROBAXIN  Take 1 tablet (500 mg total) by mouth every 6 (six) hours as needed.     oxyCODONE 5 MG immediate release tablet  Commonly known as:  Oxy IR/ROXICODONE  Take 1-2 tablets (5-10 mg total) by mouth every 3 (three) hours as needed.     potassium chloride 10 MEQ tablet  Commonly known as:  K-DUR  Take 1 tablet (10 mEq total) by mouth daily.     rivaroxaban 10 MG Tabs tablet  Commonly known as:  XARELTO  Take 1 tablet (10 mg total) by mouth daily with breakfast.     traMADol 50 MG tablet  Commonly known as:  ULTRAM  Take 1-2 tablets (50-100 mg total) by mouth every 6 (six) hours as needed.           Follow-up Information   Follow up with  Loanne Drilling, MD. Schedule an appointment as soon as possible for a visit on 12/02/2012. (Call [phone] tomorrow to make the appointment)    Contact information:   [address] [place] [address] [phone]       Signed: Patrica Duel 12/02/2012, 9:21 AM

## 2012-12-03 DIAGNOSIS — H539 Unspecified visual disturbance: Secondary | ICD-10-CM | POA: Diagnosis not present

## 2012-12-03 DIAGNOSIS — IMO0001 Reserved for inherently not codable concepts without codable children: Secondary | ICD-10-CM | POA: Diagnosis not present

## 2012-12-03 DIAGNOSIS — Z96659 Presence of unspecified artificial knee joint: Secondary | ICD-10-CM | POA: Diagnosis not present

## 2012-12-03 DIAGNOSIS — Z471 Aftercare following joint replacement surgery: Secondary | ICD-10-CM | POA: Diagnosis not present

## 2012-12-03 DIAGNOSIS — H547 Unspecified visual loss: Secondary | ICD-10-CM | POA: Diagnosis not present

## 2012-12-04 ENCOUNTER — Telehealth: Payer: Self-pay | Admitting: Pulmonary Disease

## 2012-12-04 DIAGNOSIS — Z96659 Presence of unspecified artificial knee joint: Secondary | ICD-10-CM | POA: Diagnosis not present

## 2012-12-04 DIAGNOSIS — H539 Unspecified visual disturbance: Secondary | ICD-10-CM | POA: Diagnosis not present

## 2012-12-04 DIAGNOSIS — Z471 Aftercare following joint replacement surgery: Secondary | ICD-10-CM | POA: Diagnosis not present

## 2012-12-04 DIAGNOSIS — H547 Unspecified visual loss: Secondary | ICD-10-CM | POA: Diagnosis not present

## 2012-12-04 DIAGNOSIS — I69998 Other sequelae following unspecified cerebrovascular disease: Secondary | ICD-10-CM | POA: Diagnosis not present

## 2012-12-04 DIAGNOSIS — IMO0001 Reserved for inherently not codable concepts without codable children: Secondary | ICD-10-CM | POA: Diagnosis not present

## 2012-12-04 MED ORDER — LOSARTAN POTASSIUM 50 MG PO TABS: 50.0000 mg | ORAL_TABLET | Freq: Every day | ORAL | Status: DC

## 2012-12-04 MED ORDER — OMEPRAZOLE 20 MG PO CPDR: 20.0000 mg | DELAYED_RELEASE_CAPSULE | Freq: Every day | ORAL | Status: DC

## 2012-12-04 MED ORDER — LEVOTHYROXINE SODIUM 100 MCG PO TABS: 100.0000 ug | ORAL_TABLET | Freq: Every day | ORAL | Status: DC

## 2012-12-04 NOTE — Telephone Encounter (Signed)
Called and spoke with pt and she is aware of refills sent in to the pharmacy ---she stated that her husband went to the [place] today and he turned in her rx but they did not give him her medications.  Pt to call back for any other concerns.

## 2012-12-05 DIAGNOSIS — H547 Unspecified visual loss: Secondary | ICD-10-CM | POA: Diagnosis not present

## 2012-12-05 DIAGNOSIS — H539 Unspecified visual disturbance: Secondary | ICD-10-CM | POA: Diagnosis not present

## 2012-12-05 DIAGNOSIS — IMO0001 Reserved for inherently not codable concepts without codable children: Secondary | ICD-10-CM | POA: Diagnosis not present

## 2012-12-05 DIAGNOSIS — I69998 Other sequelae following unspecified cerebrovascular disease: Secondary | ICD-10-CM | POA: Diagnosis not present

## 2012-12-05 DIAGNOSIS — Z96659 Presence of unspecified artificial knee joint: Secondary | ICD-10-CM | POA: Diagnosis not present

## 2012-12-05 DIAGNOSIS — Z471 Aftercare following joint replacement surgery: Secondary | ICD-10-CM | POA: Diagnosis not present

## 2012-12-08 DIAGNOSIS — M25569 Pain in unspecified knee: Secondary | ICD-10-CM | POA: Diagnosis not present

## 2012-12-09 ENCOUNTER — Telehealth: Payer: Self-pay | Admitting: Cardiology

## 2012-12-09 NOTE — Telephone Encounter (Signed)
Pt is aware of stress test results.

## 2012-12-09 NOTE — Telephone Encounter (Signed)
New Problem:    Patient called in wanting to know the results of her latest Stress Test form April.  Patient claims to have never received a call about the results.  Please call back.

## 2012-12-11 DIAGNOSIS — M25569 Pain in unspecified knee: Secondary | ICD-10-CM | POA: Diagnosis not present

## 2012-12-15 DIAGNOSIS — M25569 Pain in unspecified knee: Secondary | ICD-10-CM | POA: Diagnosis not present

## 2012-12-18 DIAGNOSIS — M25569 Pain in unspecified knee: Secondary | ICD-10-CM | POA: Diagnosis not present

## 2012-12-25 DIAGNOSIS — M25569 Pain in unspecified knee: Secondary | ICD-10-CM | POA: Diagnosis not present

## 2012-12-26 DIAGNOSIS — Z96659 Presence of unspecified artificial knee joint: Secondary | ICD-10-CM | POA: Diagnosis not present

## 2012-12-30 DIAGNOSIS — M25569 Pain in unspecified knee: Secondary | ICD-10-CM | POA: Diagnosis not present

## 2013-01-01 DIAGNOSIS — M25569 Pain in unspecified knee: Secondary | ICD-10-CM | POA: Diagnosis not present

## 2013-01-02 ENCOUNTER — Emergency Department (HOSPITAL_COMMUNITY)
Admission: EM | Admit: 2013-01-02 | Discharge: 2013-01-03 | Disposition: A | Discharge status: Home / Self Care | Payer: Medicare Other | Attending: Emergency Medicine | Admitting: Emergency Medicine

## 2013-01-02 ENCOUNTER — Emergency Department (HOSPITAL_COMMUNITY): Payer: Medicare Other

## 2013-01-02 ENCOUNTER — Encounter (HOSPITAL_COMMUNITY): Payer: Self-pay | Admitting: Adult Health

## 2013-01-02 DIAGNOSIS — Z87440 Personal history of urinary (tract) infections: Secondary | ICD-10-CM | POA: Insufficient documentation

## 2013-01-02 DIAGNOSIS — Z87448 Personal history of other diseases of urinary system: Secondary | ICD-10-CM | POA: Insufficient documentation

## 2013-01-02 DIAGNOSIS — Z880 Allergy status to penicillin: Secondary | ICD-10-CM | POA: Insufficient documentation

## 2013-01-02 DIAGNOSIS — F411 Generalized anxiety disorder: Secondary | ICD-10-CM | POA: Insufficient documentation

## 2013-01-02 DIAGNOSIS — Z7982 Long term (current) use of aspirin: Secondary | ICD-10-CM | POA: Insufficient documentation

## 2013-01-02 DIAGNOSIS — J441 Chronic obstructive pulmonary disease with (acute) exacerbation: Secondary | ICD-10-CM | POA: Diagnosis not present

## 2013-01-02 DIAGNOSIS — G459 Transient cerebral ischemic attack, unspecified: Secondary | ICD-10-CM | POA: Diagnosis not present

## 2013-01-02 DIAGNOSIS — Z8719 Personal history of other diseases of the digestive system: Secondary | ICD-10-CM | POA: Insufficient documentation

## 2013-01-02 DIAGNOSIS — I10 Essential (primary) hypertension: Secondary | ICD-10-CM | POA: Diagnosis not present

## 2013-01-02 DIAGNOSIS — Z7901 Long term (current) use of anticoagulants: Secondary | ICD-10-CM | POA: Diagnosis not present

## 2013-01-02 DIAGNOSIS — K219 Gastro-esophageal reflux disease without esophagitis: Secondary | ICD-10-CM | POA: Diagnosis not present

## 2013-01-02 DIAGNOSIS — R29810 Facial weakness: Secondary | ICD-10-CM | POA: Diagnosis not present

## 2013-01-02 DIAGNOSIS — E039 Hypothyroidism, unspecified: Secondary | ICD-10-CM | POA: Diagnosis not present

## 2013-01-02 DIAGNOSIS — J45901 Unspecified asthma with (acute) exacerbation: Secondary | ICD-10-CM | POA: Insufficient documentation

## 2013-01-02 DIAGNOSIS — H547 Unspecified visual loss: Secondary | ICD-10-CM | POA: Diagnosis not present

## 2013-01-02 DIAGNOSIS — Z85828 Personal history of other malignant neoplasm of skin: Secondary | ICD-10-CM | POA: Insufficient documentation

## 2013-01-02 DIAGNOSIS — R4701 Aphasia: Secondary | ICD-10-CM | POA: Diagnosis not present

## 2013-01-02 DIAGNOSIS — R4789 Other speech disturbances: Secondary | ICD-10-CM | POA: Diagnosis not present

## 2013-01-02 DIAGNOSIS — Z8701 Personal history of pneumonia (recurrent): Secondary | ICD-10-CM | POA: Diagnosis not present

## 2013-01-02 DIAGNOSIS — F329 Major depressive disorder, single episode, unspecified: Secondary | ICD-10-CM | POA: Insufficient documentation

## 2013-01-02 DIAGNOSIS — IMO0002 Reserved for concepts with insufficient information to code with codable children: Secondary | ICD-10-CM | POA: Insufficient documentation

## 2013-01-02 DIAGNOSIS — Z79899 Other long term (current) drug therapy: Secondary | ICD-10-CM | POA: Insufficient documentation

## 2013-01-02 DIAGNOSIS — R0602 Shortness of breath: Secondary | ICD-10-CM | POA: Diagnosis not present

## 2013-01-02 DIAGNOSIS — F3289 Other specified depressive episodes: Secondary | ICD-10-CM | POA: Insufficient documentation

## 2013-01-02 LAB — DIFFERENTIAL
Basophils Relative: 1 % (ref 0–1)
Eosinophils Absolute: 0.1 10*3/uL (ref 0.0–0.7)
Monocytes Relative: 8 % (ref 3–12)
Neutrophils Relative %: 46 % (ref 43–77)

## 2013-01-02 LAB — RAPID URINE DRUG SCREEN, HOSP PERFORMED
Amphetamines: NOT DETECTED
Benzodiazepines: NOT DETECTED
Cocaine: NOT DETECTED
Opiates: NOT DETECTED

## 2013-01-02 LAB — POCT I-STAT TROPONIN I

## 2013-01-02 LAB — COMPREHENSIVE METABOLIC PANEL
Albumin: 3.7 g/dL (ref 3.5–5.2)
Alkaline Phosphatase: 81 U/L (ref 39–117)
BUN: 32 mg/dL — ABNORMAL HIGH (ref 6–23)
Calcium: 10 mg/dL (ref 8.4–10.5)
Creatinine, Ser: 2.2 mg/dL — ABNORMAL HIGH (ref 0.50–1.10)
Potassium: 4.1 mEq/L (ref 3.5–5.1)
Total Protein: 7.6 g/dL (ref 6.0–8.3)

## 2013-01-02 LAB — PROTIME-INR
INR: 0.98 (ref 0.00–1.49)
Prothrombin Time: 12.9 seconds (ref 11.6–15.2)

## 2013-01-02 LAB — URINALYSIS, ROUTINE W REFLEX MICROSCOPIC
Bilirubin Urine: NEGATIVE
Glucose, UA: NEGATIVE mg/dL
Hgb urine dipstick: NEGATIVE
Ketones, ur: NEGATIVE mg/dL
Protein, ur: NEGATIVE mg/dL

## 2013-01-02 LAB — URINE MICROSCOPIC-ADD ON

## 2013-01-02 LAB — CBC
Hemoglobin: 10.8 g/dL — ABNORMAL LOW (ref 12.0–15.0)
MCH: 31.8 pg (ref 26.0–34.0)
MCHC: 35.4 g/dL (ref 30.0–36.0)
Platelets: 241 10*3/uL (ref 150–400)

## 2013-01-02 LAB — TROPONIN I: Troponin I: <0.3 ng/mL (ref <0.30)

## 2013-01-02 NOTE — ED Provider Notes (Signed)
History   CSN: [id number] Arrival date & time 01/02/13  2112 First MD Initiated Contact with Patient 01/02/13 2117      Chief Complaint  Patient presents with  . Aphasia    HPI Patient presents to the emergency room with complaints of drooling, difficulty speaking and left-sided facial droop.  The patient states that about 7:30 PM she felt the onset of a strange sensation in her mouth. She also had difficulty communicating. The symptoms lasted for at least 15 minutes if not longer. The patient's family called 911. She feels like the symptoms persisted at least until the ambulance arrived. Patient denies any symptoms at this time. She did not have any focal weakness. She denies any headache.  She has had some issues with shortness of breath with slight exertion but patient states this has been ongoing for at least the last year. It may be a little bit worse and she had knee surgery 5 weeks ago. She does not have any chest pain or shortness of breath at rest. She has not noticed any acute redness or new swelling associated with her left knee surgery.  Patient also has history of an old stroke. She states she has a small visual field cut in the right eye in the temporal area. Past Medical History  Diagnosis Date  . History of sudden visual loss   . Acute cystitis   . Bronchitis, acute   . Hypertension   . Cerebrovascular disease   . Hypothyroidism   . GERD (gastroesophageal reflux disease)   . Diverticulosis of colon   . DJD (degenerative joint disease)   . Back pain   . Transient ischemic attack   . Anxiety   . Depression   . Asthma   . COPD (chronic obstructive pulmonary disease)   . Shortness of breath     with exertion   . Pneumonia     hx of x 2   . 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 UTI (lower urinary tract infection)     hx of   . Cancer     hxof precancerous lesion on nose     Past Surgical History  Procedure Laterality Date  . Vaginal hysterectomy    .  Sigmoid colectomy    . Tubal ligation    . Replacement total knee      right knee  . Cardiothoracic procedure  01/13/2009    Right   . Right foot surgery       nerve cut between toes   . Endartarectomy      right carotid endartarectomy - 2010  . Total knee arthroplasty Left 11/17/2012    Procedure: TOTAL KNEE ARTHROPLASTY;  Surgeon: Loanne Drilling, MD;  Location: [facility];  Service: Orthopedics;  Laterality: Left;    Family History  Problem Relation Age of Onset  . Emphysema Father   . Heart disease Mother   . Coronary artery disease Sister   . Heart disease Sister   . Rheumatic fever Brother     x2  . Heart attack Brother     History  Substance Use Topics  . Smoking status: Never Smoker   . Smokeless tobacco: Never Used  . Alcohol Use: No    OB History   Grav Para Term Preterm Abortions TAB SAB Ect Mult Living                  Review of Systems  All  other systems reviewed and are negative.    Allergies  Relafen and Penicillins  Home Medications   Current Outpatient Rx  Name  Route  Sig  Dispense  Refill  . albuterol (PROVENTIL HFA;VENTOLIN HFA) 108 (90 BASE) MCG/ACT inhaler   Inhalation   Inhale 2 puffs into the lungs every 6 (six) hours as needed. For shortness of breath         . aspirin 81 MG tablet   Oral   Take 81 mg by mouth daily.         Marland Kitchen atorvastatin (LIPITOR) 20 MG tablet   Oral   Take 1 tablet (20 mg total) by mouth daily.   90 tablet   3   . Calcium Carbonate-Vit D-Min 600-400 MG-UNIT TABS   Oral   Take 2 tablets by mouth daily.          . hydrochlorothiazide (HYDRODIURIL) 25 MG tablet   Oral   Take 1 tablet (25 mg total) by mouth daily.   90 tablet   3   . hydrocortisone cream 1 %   Topical   Apply 1 application topically 2 (two) times daily.         Marland Kitchen levothyroxine (SYNTHROID, LEVOTHROID) 100 MCG tablet   Oral   Take 1 tablet (100 mcg total) by mouth daily before breakfast.   30 tablet   3   . losartan (COZAAR)  50 MG tablet   Oral   Take 1 tablet (50 mg total) by mouth daily before breakfast.   30 tablet   2   . methocarbamol (ROBAXIN) 500 MG tablet   Oral   Take 1 tablet (500 mg total) by mouth every 6 (six) hours as needed.   80 tablet   1   . omeprazole (PRILOSEC) 20 MG capsule   Oral   Take 1 capsule (20 mg total) by mouth daily.   30 capsule   3   . oxycodone (OXY-IR) 5 MG capsule   Oral   Take 5-10 mg by mouth every 3 (three) hours as needed for pain.         . potassium chloride (K-DUR) 10 MEQ tablet   Oral   Take 1 tablet (10 mEq total) by mouth daily.   90 tablet   3   . traMADol (ULTRAM) 50 MG tablet   Oral   Take 1-2 tablets (50-100 mg total) by mouth every 6 (six) hours as needed.   80 tablet   1   . rivaroxaban (XARELTO) 10 MG TABS tablet   Oral   Take 1 tablet (10 mg total) by mouth daily with breakfast.   18 tablet   0     BP 121/48  Pulse 78  Temp(Src) 98 F (36.7 C) (Oral)  Resp 17  SpO2 98%  Physical Exam  Nursing note and vitals reviewed. Constitutional: She is oriented to person, place, and time. She appears well-developed and well-nourished. No distress.  HENT:  Head: Normocephalic and atraumatic.  Right Ear: External ear normal.  Left Ear: External ear normal.  Mouth/Throat: Oropharynx is clear and moist.  Eyes: Conjunctivae are normal. Right eye exhibits no discharge. Left eye exhibits no discharge. No scleral icterus.  Neck: Neck supple. No tracheal deviation present.  Cardiovascular: Normal rate, regular rhythm and intact distal pulses.   Pulmonary/Chest: Effort normal and breath sounds normal. No stridor. No respiratory distress. She has no wheezes. She has no rales.  Abdominal: Soft. Bowel sounds are normal. She exhibits  no distension. There is no tenderness. There is no rebound and no guarding.  Musculoskeletal: She exhibits no edema and no tenderness.  Neurological: She is alert and oriented to person, place, and time. She has  normal strength. No cranial nerve deficit ( no gross defecits noted) or sensory deficit. She exhibits normal muscle tone. She displays no seizure activity. Coordination normal.  No pronator drift bilateral upper extrem, able to hold both legs off bed for 5 seconds, sensation intact in all extremities, patient can still see my fingers in all visual fields, no left or right sided neglect  Skin: Skin is warm and dry. No rash noted.  Psychiatric: She has a normal mood and affect.    ED Course  Procedures (including critical care time) EKG Normal sinus rhythm rate 79 Normal axis, normal intervals Normal ST-T waves No prior EKG for comparison Labs Reviewed  CBC - Abnormal; Notable for the following:    RBC 3.40 (*)    Hemoglobin 10.8 (*)    HCT 30.5 (*)    All other components within normal limits  COMPREHENSIVE METABOLIC PANEL - Abnormal; Notable for the following:    BUN 32 (*)    Creatinine, Ser 2.20 (*)    Total Bilirubin 0.2 (*)    GFR calc non Af Amer 21 (*)    GFR calc Af Amer 24 (*)    All other components within normal limits  URINALYSIS, ROUTINE W REFLEX MICROSCOPIC - Abnormal; Notable for the following:    Leukocytes, UA TRACE (*)    All other components within normal limits  URINE MICROSCOPIC-ADD ON - Abnormal; Notable for the following:    Squamous Epithelial / LPF FEW (*)    Bacteria, UA FEW (*)    Casts HYALINE CASTS (*)    All other components within normal limits  URINE CULTURE  PROTIME-INR  APTT  DIFFERENTIAL  TROPONIN I  URINE RAPID DRUG SCREEN (HOSP PERFORMED)  GLUCOSE, CAPILLARY  POCT I-STAT TROPONIN I   Ct Head Wo Contrast  01/02/2013   *RADIOLOGY REPORT*  Clinical Data: 78 year old female with left facial droop and aphasia.  CT HEAD WITHOUT CONTRAST  Technique:  Contiguous axial images were obtained from the base of the skull through the vertex without contrast.  Comparison: 10/03/2004 and 03/18/2003 MRs  Findings: Mild chronic small vessel white matter  ischemic changes are again noted. No acute intracranial abnormalities are identified, including mass lesion or mass effect, hydrocephalus, extra-axial fluid collection, midline shift, hemorrhage, or acute infarction.  The visualized bony calvarium is unremarkable.  IMPRESSION: No evidence of acute intracranial abnormality.   Original Report Authenticated By: Harmon Pier, M.D.     1. TIA (transient ischemic attack)       MDM  The patient's symptoms were concerning for TIA. I recommended patient be admitted to the hospital for further evaluation. I discussed that her 30 day stroke risk was 10%. The patient was adamant that she did not want to be in the hospital. She has followup plans already with her vascular surgeon. She will call her doctor on Monday. Patient understands that she should return over the weekend if she has any recurrent symptoms whatsoever.        Celene Kras, MD 01/03/13 [phone]

## 2013-01-02 NOTE — ED Notes (Signed)
CT notified of need for head CT, "will be next".

## 2013-01-02 NOTE — ED Notes (Signed)
Presents with left sided facial droop, drooling, difficulty speaking. Pt denies weakness. No arm drift or  Facial droop at this time. Pt is alert and oriented, answering all questions appropriately, equal grips. Symptoms began at 1930 and resolved in 15 minutes. HX of carotid stent and TIA. Reports increased SOB with light exertion since knee surgery completed 5 weeks ago.

## 2013-01-04 LAB — URINE CULTURE

## 2013-01-05 ENCOUNTER — Other Ambulatory Visit (INDEPENDENT_AMBULATORY_CARE_PROVIDER_SITE_OTHER): Payer: Medicare Other

## 2013-01-05 ENCOUNTER — Telehealth: Payer: Self-pay | Admitting: Pulmonary Disease

## 2013-01-05 DIAGNOSIS — E7800 Pure hypercholesterolemia, unspecified: Secondary | ICD-10-CM | POA: Diagnosis not present

## 2013-01-05 DIAGNOSIS — R0989 Other specified symptoms and signs involving the circulatory and respiratory systems: Secondary | ICD-10-CM

## 2013-01-05 DIAGNOSIS — R0609 Other forms of dyspnea: Secondary | ICD-10-CM | POA: Diagnosis not present

## 2013-01-05 DIAGNOSIS — G459 Transient cerebral ischemic attack, unspecified: Secondary | ICD-10-CM

## 2013-01-05 LAB — HEPATIC FUNCTION PANEL
ALT: 12 U/L (ref 0–35)
Albumin: 3.6 g/dL (ref 3.5–5.2)
Alkaline Phosphatase: 62 U/L (ref 39–117)
Total Protein: 7.9 g/dL (ref 6.0–8.3)

## 2013-01-05 LAB — LIPID PANEL
Cholesterol: 137 mg/dL (ref 0–200)
LDL Cholesterol: 55 mg/dL (ref 0–99)
Triglycerides: 121 mg/dL (ref 0.0–149.0)
VLDL: 24.2 mg/dL (ref 0.0–40.0)

## 2013-01-05 NOTE — Telephone Encounter (Signed)
Per ED note: The patient's symptoms were concerning for TIA. I recommended patient be admitted to the hospital for further evaluation. I discussed that her 30 day stroke risk was 10%. The patient was adamant that she did not want to be in the hospital. She has followup plans already with her vascular surgeon. She will call her doctor on Monday. Patient understands that she should return over the weekend if she has any recurrent symptoms whatsoever. ---  I spoke with pt.s he stated her d/c note stated to f.u with SN ASAP. Nothing available. Please advise SN thanks Last OV 08/27/12 pending 02/24/13

## 2013-01-05 NOTE — Telephone Encounter (Signed)
Per SN--  She has seen Dr. Anne Hahn in the past for "stroke in her eye"---she will need neuro eval.   We will see if they can work her in this week to be seen.  If not then we can add her on to see SN  For this Friday morning.  thanks

## 2013-01-05 NOTE — Telephone Encounter (Signed)
Pt aware of recs. She does not remembering seeing Dr. Anne Hahn. Order sent to [facility].

## 2013-01-06 ENCOUNTER — Telehealth: Payer: Self-pay | Admitting: *Deleted

## 2013-01-06 NOTE — Telephone Encounter (Signed)
Calling for pt.   She called there office by mistake.  She needed out office for f/u app for TIA on 01-03-13.

## 2013-01-07 ENCOUNTER — Encounter: Payer: Self-pay | Admitting: Neurology

## 2013-01-07 ENCOUNTER — Encounter: Payer: Self-pay | Admitting: *Deleted

## 2013-01-07 ENCOUNTER — Ambulatory Visit (INDEPENDENT_AMBULATORY_CARE_PROVIDER_SITE_OTHER): Payer: Medicare Other | Admitting: Neurology

## 2013-01-07 VITALS — BP 107/61 | HR 91 | Ht 66.0 in | Wt 165.0 lb

## 2013-01-07 DIAGNOSIS — G459 Transient cerebral ischemic attack, unspecified: Secondary | ICD-10-CM

## 2013-01-07 NOTE — Patient Instructions (Addendum)
Continues  xarelto for DVT prevention following knee surgery and when discontinued changed to aspirin 325 mg daily. Strict control of lipids with LDL cholesterol goal below 100 mg percent and hypertension with blood pressure goal below 130/90. Check MRA of the brain, neck and MRI of the brain as well as fasting lipid profile and hemoglobin A1c for TIA evaluation. Check transthoracic echocardiogram. Return follow up in 6 weeks with Larita Fife, NP

## 2013-01-07 NOTE — Progress Notes (Signed)
[facility] [address] [place].  [address] [phone]       OFFICE CONSULT NOTE  [name] Date of Birth:  30-Mar-1935 Medical Record Number:  [id number]   Referring MD:  Linwood Dibbles, MD  Reason for Referral:  TIA HPI: 78 year Caucasian lady who had a transient episode of sudden onset dysarthria, left facial droop and drooling of saliva from the left corner of mouth,  lasting 30 minutes on 01/03/13. She had stopped her aspirin recently for her knee surgery which has since been  restarted after the episode. She went to [facility] emergency room and had  a CT scan of the head which  was unremarkable. She was advised admission and TIA workup t but the patient refused both. She does have a history of stroke 4 years ago when she lost partial vision in the right eye and was found to have symptomatic carotid stenosis for which he underwent carotid endarterectomy. She is followed by Dr. Hart Rochester and states her last carotid ultrasound done 2 months ago had shown some mild left ICA stenosis but I do not have those results. She was also on Lipitor which she stopped 6 days ago for unclear reasons. She states her blood pressure is under good control. She does not smoke. ROS:   14 system review of systems is positive for fatigue, weight loss, shortness of breath, knee pain, slurred speech and facial drool  PMH:  Past Medical History  Diagnosis Date  . History of sudden visual loss   . Acute cystitis   . Bronchitis, acute   . Hypertension   . Cerebrovascular disease   . Hypothyroidism   . GERD (gastroesophageal reflux disease)   . Diverticulosis of colon   . DJD (degenerative joint disease)   . Back pain   . Transient ischemic attack   . Anxiety   . Depression   . Asthma   . COPD (chronic obstructive pulmonary disease)   . Shortness of breath     with exertion   . Pneumonia     hx of x 2   . 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 UTI (lower urinary  tract infection)     hx of   . Cancer     hxof precancerous lesion on nose     Social History:  History   Social History  . Marital Status: Married    Spouse Name: charles    Number of Children: N/A  . Years of Education: N/A   Occupational History  . Not on file.   Social History Main Topics  . Smoking status: Never Smoker   . Smokeless tobacco: Never Used  . Alcohol Use: No  . Drug Use: No  . Sexually Active: Not on file   Other Topics Concern  . Not on file   Social History Narrative  . No narrative on file    Medications:   Current Outpatient Prescriptions on File Prior to Visit  Medication Sig Dispense Refill  . albuterol (PROVENTIL HFA;VENTOLIN HFA) 108 (90 BASE) MCG/ACT inhaler Inhale 2 puffs into the lungs every 6 (six) hours as needed. For shortness of breath      . aspirin 81 MG tablet Take 81 mg by mouth daily.      . Calcium Carbonate-Vit D-Min 600-400 MG-UNIT TABS Take 2 tablets by mouth daily.       . hydrochlorothiazide (HYDRODIURIL) 25 MG tablet Take 1  tablet (25 mg total) by mouth daily.  90 tablet  3  . hydrocortisone cream 1 % Apply 1 application topically 2 (two) times daily.      Marland Kitchen levothyroxine (SYNTHROID, LEVOTHROID) 100 MCG tablet Take 1 tablet (100 mcg total) by mouth daily before breakfast.  30 tablet  3  . losartan (COZAAR) 50 MG tablet Take 1 tablet (50 mg total) by mouth daily before breakfast.  30 tablet  2  . methocarbamol (ROBAXIN) 500 MG tablet Take 1 tablet (500 mg total) by mouth every 6 (six) hours as needed.  80 tablet  1  . omeprazole (PRILOSEC) 20 MG capsule Take 1 capsule (20 mg total) by mouth daily.  30 capsule  3  . oxycodone (OXY-IR) 5 MG capsule Take 5-10 mg by mouth every 3 (three) hours as needed for pain.      . potassium chloride (K-DUR) 10 MEQ tablet Take 1 tablet (10 mEq total) by mouth daily.  90 tablet  3  . rivaroxaban (XARELTO) 10 MG TABS tablet Take 1 tablet (10 mg total) by mouth daily with breakfast.  18 tablet  0   . traMADol (ULTRAM) 50 MG tablet Take 1-2 tablets (50-100 mg total) by mouth every 6 (six) hours as needed.  80 tablet  1   No current facility-administered medications on file prior to visit.    Allergies:   Allergies  Allergen Reactions  . Relafen (Nabumetone) Diarrhea and Other (See Comments)    GI / Urinary Bleeding  . Penicillins Hives, Itching, Swelling and Rash   Filed Vitals:   01/07/13 1351  BP: 107/61  Pulse: 91     Physical Exam General: well developed, well nourished elderly Caucasian lady, seated, in no evident distress Head: head normocephalic and atraumatic. Orohparynx benign Neck: supple with no carotid or supraclavicular bruits Cardiovascular: regular rate and rhythm, no murmurs Musculoskeletal: no deformity Skin:  no rash/petichiae, surgical scar in right neck from CEA and left knee from recent surgery. Vascular:  Normal pulses all extremities  Neurologic Exam Mental Status: Awake and fully alert. Oriented to place and time. Recent and remote memory intact. Attention span, concentration and fund of knowledge appropriate. Mood and affect appropriate.  Cranial Nerves: Fundoscopic exam reveals sharp disc margins. Pupils equal, briskly reactive to light.Right eye has large scotoma in nasal inferior quadrant. Extraocular movements full without nystagmus. Visual fields full to confrontation. Hearing intact. Facial sensation intact. Face, tongue, palate moves normally and symmetrically.  Motor: Normal bulk and tone. Normal strength in all tested extremity muscles. Sensory.: intact to tough and pinprick and vibratory.  Coordination: Rapid alternating movements normal in all extremities. Finger-to-nose and heel-to-shin performed accurately bilaterally. Gait and Station: Arises from chair without difficulty. Stance is normal. Gait demonstrates normal stride length and balance . Able to heel, toe and tandem walk without difficulty.  Reflexes: 1+ and symmetric. Toes  downgoing.     ASSESSMENT:  78 year old lady with a right brain TIA on 01/03/13 likely from small vessel disease with vascular risk factors of hypertension and prior cerebrovascular disease. And carotid stenosis   PLAN: Continue aspirin for stroke prevention and strict control of hypertension with blood pressure goal below 130/90. Restart Lipitor for her elevated lipids with LDL cholesterol goal below 100 mg percent Check to have scan of the brain and MRA of the brain and neck. to evaluate carotid restenosis Check fasting lipid profile, hemoglobin A1c and transthoracic echo. Return for followup in 6 weeks with Larita Fife, NP  And call  earlier if necessary.

## 2013-01-08 DIAGNOSIS — M25569 Pain in unspecified knee: Secondary | ICD-10-CM | POA: Diagnosis not present

## 2013-01-09 DIAGNOSIS — G459 Transient cerebral ischemic attack, unspecified: Secondary | ICD-10-CM | POA: Diagnosis not present

## 2013-01-10 LAB — LIPID PANEL
Chol/HDL Ratio: 2.5 ratio units (ref 0.0–4.4)
HDL: 63 mg/dL (ref >39)
Triglycerides: 98 mg/dL (ref 0–149)

## 2013-01-15 ENCOUNTER — Ambulatory Visit (HOSPITAL_COMMUNITY): Payer: Medicare Other | Attending: Neurology

## 2013-01-15 DIAGNOSIS — R0609 Other forms of dyspnea: Secondary | ICD-10-CM | POA: Insufficient documentation

## 2013-01-15 DIAGNOSIS — G459 Transient cerebral ischemic attack, unspecified: Secondary | ICD-10-CM | POA: Insufficient documentation

## 2013-01-15 DIAGNOSIS — J4489 Other specified chronic obstructive pulmonary disease: Secondary | ICD-10-CM | POA: Insufficient documentation

## 2013-01-15 DIAGNOSIS — I10 Essential (primary) hypertension: Secondary | ICD-10-CM | POA: Diagnosis not present

## 2013-01-15 DIAGNOSIS — J449 Chronic obstructive pulmonary disease, unspecified: Secondary | ICD-10-CM | POA: Diagnosis not present

## 2013-01-15 DIAGNOSIS — R0989 Other specified symptoms and signs involving the circulatory and respiratory systems: Secondary | ICD-10-CM | POA: Insufficient documentation

## 2013-01-15 NOTE — Progress Notes (Signed)
Echocardiogram performed.  

## 2013-01-27 ENCOUNTER — Ambulatory Visit
Admission: RE | Admit: 2013-01-27 | Discharge: 2013-01-27 | Disposition: A | Discharge status: Home / Self Care | Payer: Medicare Other | Source: Ambulatory Visit | Attending: Neurology | Admitting: Neurology

## 2013-01-27 DIAGNOSIS — G459 Transient cerebral ischemic attack, unspecified: Secondary | ICD-10-CM

## 2013-01-27 DIAGNOSIS — I6789 Other cerebrovascular disease: Secondary | ICD-10-CM | POA: Diagnosis not present

## 2013-02-05 DIAGNOSIS — Z96659 Presence of unspecified artificial knee joint: Secondary | ICD-10-CM | POA: Diagnosis not present

## 2013-02-19 ENCOUNTER — Ambulatory Visit (INDEPENDENT_AMBULATORY_CARE_PROVIDER_SITE_OTHER): Payer: Medicare Other | Admitting: Nurse Practitioner

## 2013-02-19 ENCOUNTER — Encounter: Payer: Self-pay | Admitting: Nurse Practitioner

## 2013-02-19 VITALS — BP 147/64 | HR 81 | Temp 97.2°F | Ht 66.0 in | Wt 166.0 lb

## 2013-02-19 DIAGNOSIS — G459 Transient cerebral ischemic attack, unspecified: Secondary | ICD-10-CM

## 2013-02-19 MED ORDER — ASPIRIN EC 325 MG PO TBEC: 325.0000 mg | DELAYED_RELEASE_TABLET | Freq: Every day | ORAL | Status: DC

## 2013-02-19 MED ORDER — ROSUVASTATIN CALCIUM 10 MG PO TABS: 10.0000 mg | ORAL_TABLET | Freq: Every day | ORAL | Status: DC

## 2013-02-19 NOTE — Patient Instructions (Addendum)
Continue aspirin 325 mg orally every day  for secondary stroke prevention and maintain strict control of hypertension with blood pressure goal below 130/90, diabetes with hemoglobin A1c goal below 6.5% and lipids with LDL cholesterol goal below 100 mg/dL. Followup in the future with me in 6 months.  Start Crestor 10mg  daily, 1 month supply sent to [facility].    STROKE/TIA INSTRUCTIONS SMOKING Cigarette smoking nearly doubles your risk of having a stroke & is the single most alterable risk factor  If you smoke or have smoked in the last 12 months, you are advised to quit smoking for your health.  Most of the excess cardiovascular risk related to smoking disappears within a year of stopping.  Ask you doctor about anti-smoking medications   Quit Line: 1-800-QUIT NOW  Free Smoking Cessation Classes [phone]  CHOLESTEROL Know your levels; limit fat & cholesterol in your diet  Lab Results  Component Value Date   CHOL 137 01/05/2013   HDL 63 01/09/2013   LDLCALC 74 01/09/2013   LDLDIRECT 103.1 10/10/2011   TRIG 98 01/09/2013   CHOLHDL 2.5 01/09/2013      Many patients benefit from treatment even if their cholesterol is at goal.  Goal: Total Cholesterol less than 160  Goal:  LDL less than 100  Goal:  HDL greater than 40  Goal:  Triglycerides less than 150  BLOOD PRESSURE American Stroke Association blood pressure target is less that 120/80 mm/Hg  Your discharge blood pressure is:     Monitor your blood pressure  Limit your salt and alcohol intake  Many individuals will require more than one medication for high blood pressure  DIABETES (A1c is a blood sugar average for last 3 months) Goal A1c is under 7% (A1c is blood sugar average for last 3 months)  Diabetes: No known diagnosis of diabetes    No results found for this basename: HGBA1C    Your A1c can be lowered with medications, healthy diet, and exercise.  Check your blood sugar as directed by your physician  Call your  physician if you experience unexplained or low blood sugars.  PHYSICAL ACTIVITY/REHABILITATION Goal is 30 minutes at least 4 days per week    Activity decreases your risk of heart attack and stroke and makes your heart stronger.  It helps control your weight and blood pressure; helps you relax and can improve your mood.  Participate in a regular exercise program.  Talk with your doctor about the best form of exercise for you (dancing, walking, swimming, cycling).  DIET/WEIGHT Goal is to maintain a healthy weight  Your height is:    Your current weight is:   Your body Mass Index (BMI) is:     Following the type of diet specifically designed for you will help prevent another stroke.  Your goal Body Mass Index (BMI) is 19-24.  Healthy food habits can help reduce 3 risk factors for stroke:  High cholesterol, hypertension, and excess weight.

## 2013-02-19 NOTE — Progress Notes (Signed)
[facility]  PATIENT: [name] DOB: 22-Dec-1934   HISTORY FROM: patient, chart REASON FOR VISIT: TIA 6 week follow up   HISTORICAL  CHIEF COMPLAINT:  Chief Complaint  Patient presents with  . Follow-up    TIA    HISTORY OF PRESENT ILLNESS: UPDATE 02/19/13 (LL):  Patient returns to office for follow up, no neurologic complaints or episodes since last TIA on 01/03/13.  She has stopped taking Lipitor due to side effect profile and she thinks her memory is worse since starting it.  Her MRA neck showed no significant stenosis at either carotid bifurcation.  MRI shows chronic microvascular changes.  Her husband has been diagnosed with Alzheimer's and she is tearful talking about it.  She is no longer taking Xarelto for DVT prophylaxis after knee replacement, has gone back to 81 mg ASA.  PRIOR HPI (PS): 47 year Caucasian lady who had a transient episode of sudden onset dysarthria, left facial droop and drooling of saliva from the left corner of mouth, lasting 30 minutes on 01/03/13. She had stopped her aspirin recently for her knee surgery which has since been restarted after the episode. She went to [facility] emergency room and had a CT scan of the head which was unremarkable. She was advised admission and TIA workup t but the patient refused both. She does have a history of stroke 4 years ago when she lost partial vision in the right eye and was found to have symptomatic carotid stenosis for which he underwent carotid endarterectomy. She is followed by Dr. Hart Rochester and states her last carotid ultrasound done 2 months ago had shown some mild left ICA stenosis but I do not have those results. She was also on Lipitor which she stopped 6 days ago for unclear reasons. She states her blood pressure is under good control. She does not smoke.  REVIEW OF SYSTEMS: Full 14 system review of systems performed and notable only for:  Constitutional: fatigue  Cardiovascular: N/A    Ear/Nose/Throat: N/A  Skin: itching  Eyes: N/A  Respiratory: N/A  Gastroitestinal: N/A  Hematology/Lymphatic: N/A  Endocrine: N/A Musculoskeletal:N/A  Allergy/Immunology: N/A  Neurological: N/A Psychiatric: N/A   ALLERGIES: Allergies  Allergen Reactions  . Relafen (Nabumetone) Diarrhea and Other (See Comments)    GI / Urinary Bleeding  . Penicillins Hives, Itching, Swelling and Rash    HOME MEDICATIONS: Outpatient Prescriptions Prior to Visit  Medication Sig Dispense Refill  . levothyroxine (SYNTHROID, LEVOTHROID) 100 MCG tablet Take 1 tablet (100 mcg total) by mouth daily before breakfast.  30 tablet  3  . losartan (COZAAR) 50 MG tablet Take 1 tablet (50 mg total) by mouth daily before breakfast.  30 tablet  2  . omeprazole (PRILOSEC) 20 MG capsule Take 1 capsule (20 mg total) by mouth daily.  30 capsule  3  . aspirin 81 MG tablet Take 81 mg by mouth daily.      Marland Kitchen albuterol (PROVENTIL HFA;VENTOLIN HFA) 108 (90 BASE) MCG/ACT inhaler Inhale 2 puffs into the lungs every 6 (six) hours as needed. For shortness of breath      . Calcium Carbonate-Vit D-Min 600-400 MG-UNIT TABS Take 2 tablets by mouth daily.       . hydrochlorothiazide (HYDRODIURIL) 25 MG tablet Take 1 tablet (25 mg total) by mouth daily.  90 tablet  3  . hydrocortisone cream 1 % Apply 1 application topically 2 (two) times daily.      . methocarbamol (ROBAXIN) 500 MG tablet  Take 1 tablet (500 mg total) by mouth every 6 (six) hours as needed.  80 tablet  1  . oxycodone (OXY-IR) 5 MG capsule Take 5-10 mg by mouth every 3 (three) hours as needed for pain.      . potassium chloride (K-DUR) 10 MEQ tablet Take 1 tablet (10 mEq total) by mouth daily.  90 tablet  3  . rivaroxaban (XARELTO) 10 MG TABS tablet Take 1 tablet (10 mg total) by mouth daily with breakfast.  18 tablet  0  . traMADol (ULTRAM) 50 MG tablet Take 1-2 tablets (50-100 mg total) by mouth every 6 (six) hours as needed.  80 tablet  1   No  facility-administered medications prior to visit.    PAST MEDICAL HISTORY: Past Medical History  Diagnosis Date  . History of sudden visual loss   . Acute cystitis   . Bronchitis, acute   . Hypertension   . Cerebrovascular disease   . Hypothyroidism   . GERD (gastroesophageal reflux disease)   . Diverticulosis of colon   . DJD (degenerative joint disease)   . Back pain   . Transient ischemic attack   . Anxiety   . Depression   . Asthma   . COPD (chronic obstructive pulmonary disease)   . Shortness of breath     with exertion   . Pneumonia     hx of x 2   . 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 UTI (lower urinary tract infection)     hx of   . Cancer     hxof precancerous lesion on nose     PAST SURGICAL HISTORY: Past Surgical History  Procedure Laterality Date  . Vaginal hysterectomy    . Sigmoid colectomy    . Tubal ligation    . Replacement total knee      right knee  . Cardiothoracic procedure  01/13/2009    Right   . Right foot surgery       nerve cut between toes   . Endartarectomy      right carotid endartarectomy - 2010  . Total knee arthroplasty Left 11/17/2012    Procedure: TOTAL KNEE ARTHROPLASTY;  Surgeon: Loanne Drilling, MD;  Location: [facility];  Service: Orthopedics;  Laterality: Left;    FAMILY HISTORY: Family History  Problem Relation Age of Onset  . Emphysema Father   . Heart disease Mother   . Coronary artery disease Sister   . Heart disease Sister   . Rheumatic fever Brother     x2  . Heart attack Brother     SOCIAL HISTORY: History   Social History  . Marital Status: Married    Spouse Name: charles    Number of Children: N/A  . Years of Education: N/A   Occupational History  . Not on file.   Social History Main Topics  . Smoking status: Never Smoker   . Smokeless tobacco: Never Used  . Alcohol Use: No  . Drug Use: No  . Sexually Active: Not on file   Other Topics Concern  . Not on file   Social History  Narrative  . No narrative on file     PHYSICAL EXAM  Filed Vitals:   02/19/13 1306  BP: 147/64  Pulse: 81  Temp: 97.2 F (36.2 C)  TempSrc: Oral  Height: 5\' 6"  (1.676 m)  Weight: 166 lb (75.297 kg)   Body mass index is 26.81 kg/(m^2).  General:  well developed, well nourished elderly Caucasian lady, seated, in no evident distress  Head: head normocephalic and atraumatic. Orohparynx benign  Neck: supple with no carotid or supraclavicular bruits  Cardiovascular: regular rate and rhythm, 2/6 systolic murmur  Musculoskeletal: no deformity. Skin: no rash/petichiae, surgical scar in right neck from CEA and left knee from recent surgery.  Vascular: Normal pulses all extremities  Neurologic Exam  Mental Status: Awake and fully alert. Oriented to place and time. Recent and remote memory intact. Attention span, concentration and fund of knowledge appropriate. Mood and affect appropriate.  Cranial Nerves: Fundoscopic exam reveals sharp disc margins. Pupils equal, briskly reactive to light.Right eye has large scotoma in nasal inferior quadrant. Extraocular movements full without nystagmus. Visual fields full to confrontation. Hearing intact. Facial sensation intact. Face, tongue, palate moves normally and symmetrically.  Motor: Normal bulk and tone. Normal strength in all tested extremity muscles.  Sensory.: intact to tough and pinprick and vibratory.  Coordination: Rapid alternating movements normal in all extremities. Finger-to-nose and heel-to-shin performed accurately bilaterally.  Gait and Station: Arises from chair without difficulty. Stance is normal. Gait demonstrates normal stride length and balance.  UNABLE to heel, toe and tandem walk without difficulty.  Reflexes: 1+ and symmetric. Toes downgoing.   DIAGNOSTIC DATA (LABS, IMAGING, TESTING) - I reviewed patient records, labs, notes, testing and imaging myself where available.  Lab Results  Component Value Date   WBC 6.1 01/02/2013    HGB 10.8* 01/02/2013   HCT 30.5* 01/02/2013   MCV 89.7 01/02/2013   PLT 241 01/02/2013      Component Value Date/Time   NA 136 01/02/2013 2129   K 4.1 01/02/2013 2129   CL 102 01/02/2013 2129   CO2 24 01/02/2013 2129   GLUCOSE 97 01/02/2013 2129   BUN 32* 01/02/2013 2129   CREATININE 2.20* 01/02/2013 2129   CALCIUM 10.0 01/02/2013 2129   PROT 7.9 01/05/2013 0934   ALBUMIN 3.6 01/05/2013 0934   AST 19 01/05/2013 0934   ALT 12 01/05/2013 0934   ALKPHOS 62 01/05/2013 0934   BILITOT 0.5 01/05/2013 0934   GFRNONAA 21* 01/02/2013 2129   GFRAA 24* 01/02/2013 2129   Lab Results  Component Value Date   CHOL 137 01/05/2013   HDL 63 01/09/2013   LDLCALC 74 01/09/2013   LDLDIRECT 103.1 10/10/2011   TRIG 98 01/09/2013   CHOLHDL 2.5 01/09/2013   No results found for this basename: HGBA1C   No results found for this basename: VITAMINB12   Lab Results  Component Value Date   TSH 0.92 10/10/2011   Myocardial Perfusion Imaging 11/17/12 Normal stress nuclear study.  LV Ejection Fraction: Study not gated. LV Wall Motion: NA. Normal study but hypertensive BP response.  Carotid Doppler 03/06/12:  Largely patent right carotid antrectomy without evidence of hyperplasia or restenosis. 1% to 39% left internal carotid artery stenosis. Bilateral vertebral arteries within normal limits. MRA Neck wo 01/07/13 Normal noncontrast MRA of the neck without significant stenosis at either carotid bifurcation. Both vertebral arteries have antegrade flow. MRA Brain wo 01/07/13 This MRA of the brain shows no significant stenosis of the medium and large size intracranial vessels. 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 No significant changes compared with previous MRA dated 10/03/2004 except the previously described small left cavernous ICA aneurysm is not appreciated. MRI brain wo 01/07/13 Abnormal MRI scan of the brain showing mild changes of chronic microvascular ischemia with generalized cerebral  atrophy. No significant  changes compared with CT head 01/02/2013 2D Echo 01/15/13 The estimated ejection fraction was in the range of 55% to 65%. Aortic valve: There was very mild stenosis. Moderate regurgitation. Mitral valve: Mild regurgitation. Atrial septum: No defect or patent foramen ovale was identified.  ASSESSMENT: 78 year old lady with a right brain TIA on 01/03/13 likely from small vessel disease with vascular risk factors of hypertension and prior cerebrovascular disease and carotid stenosis s/p right endarterectomy.  PLAN:  Continue aspirin 325 mg  for stroke prevention and strict control of hypertension with blood pressure goal below 130/90 and elevated lipids with LDL cholesterol goal below 100 mg percent.   Patient refuses take Lipitor due to side effects she has heard about.  I convinced her to try Crestor.  Recheck lipid panel is 6 months.  Start Crestor, 10 mg daily.  Return for followup in 6 months with Larita Fife, NP and call earlier if necessary.  Meds ordered this encounter  Medications  . rosuvastatin (CRESTOR) 10 MG tablet    Sig: Take 1 tablet (10 mg total) by mouth daily.    Dispense:  30 tablet    Refill:  0    Order Specific Question:  Supervising Provider    Answer:    . rosuvastatin (CRESTOR) 10 MG tablet    Sig: Take 1 tablet (10 mg total) by mouth daily.    Dispense:  90 tablet    Refill:  3    Order Specific Question:  Supervising Provider    Answer:  Micki Riley [[id number]]     Aldrick Derrig NP-C 02/19/2013, 1:41 PM  [facility] [address] [place], [place] [address] [phone]

## 2013-02-20 ENCOUNTER — Telehealth: Payer: Self-pay

## 2013-02-20 MED ORDER — SIMVASTATIN 10 MG PO TABS: 10.0000 mg | ORAL_TABLET | Freq: Every day | ORAL | Status: DC

## 2013-02-20 NOTE — Telephone Encounter (Signed)
Patient's insurance would not cover Crestor, changing Rx to Simvastatin 10 mg, 1 tablet daily at bedtime.

## 2013-02-20 NOTE — Telephone Encounter (Signed)
Yes, I will put in an order for Simvastatin 10 mg for 1 month supply at [facility].  The patient wants a 90 day written script with 3 refills to be filled at [facility] (cheaper), can we mail that to her? Thanks, LL

## 2013-02-20 NOTE — Telephone Encounter (Signed)
[facility] sent [place] a fax noting that the patients insurance will not pay for Crestor without a documented trial and failure of Simvastatin, Pravastatin AND Atorvastatin.  Can we change to one of these formulary alternatives?  Please advise.  Thank you.

## 2013-02-20 NOTE — Telephone Encounter (Signed)
We can mail her a written Rx, no problem.  (Sorry, I tried to print it, but the system wont allow me-I can view Rx as read only at this time since you were updating meds)

## 2013-02-24 ENCOUNTER — Ambulatory Visit (INDEPENDENT_AMBULATORY_CARE_PROVIDER_SITE_OTHER): Payer: Medicare Other | Admitting: Pulmonary Disease

## 2013-02-24 ENCOUNTER — Encounter: Payer: Self-pay | Admitting: Pulmonary Disease

## 2013-02-24 VITALS — BP 150/64 | HR 79 | Temp 97.9°F | Ht 65.0 in | Wt 168.0 lb

## 2013-02-24 DIAGNOSIS — K5730 Diverticulosis of large intestine without perforation or abscess without bleeding: Secondary | ICD-10-CM

## 2013-02-24 DIAGNOSIS — I6529 Occlusion and stenosis of unspecified carotid artery: Secondary | ICD-10-CM | POA: Diagnosis not present

## 2013-02-24 DIAGNOSIS — M1990 Unspecified osteoarthritis, unspecified site: Secondary | ICD-10-CM

## 2013-02-24 DIAGNOSIS — E559 Vitamin D deficiency, unspecified: Secondary | ICD-10-CM

## 2013-02-24 DIAGNOSIS — I679 Cerebrovascular disease, unspecified: Secondary | ICD-10-CM

## 2013-02-24 DIAGNOSIS — G459 Transient cerebral ischemic attack, unspecified: Secondary | ICD-10-CM | POA: Diagnosis not present

## 2013-02-24 DIAGNOSIS — E039 Hypothyroidism, unspecified: Secondary | ICD-10-CM

## 2013-02-24 DIAGNOSIS — M545 Low back pain, unspecified: Secondary | ICD-10-CM

## 2013-02-24 DIAGNOSIS — F329 Major depressive disorder, single episode, unspecified: Secondary | ICD-10-CM

## 2013-02-24 DIAGNOSIS — I10 Essential (primary) hypertension: Secondary | ICD-10-CM | POA: Diagnosis not present

## 2013-02-24 DIAGNOSIS — D649 Anemia, unspecified: Secondary | ICD-10-CM

## 2013-02-24 DIAGNOSIS — K219 Gastro-esophageal reflux disease without esophagitis: Secondary | ICD-10-CM

## 2013-02-24 DIAGNOSIS — F411 Generalized anxiety disorder: Secondary | ICD-10-CM

## 2013-02-24 DIAGNOSIS — E785 Hyperlipidemia, unspecified: Secondary | ICD-10-CM

## 2013-02-24 DIAGNOSIS — F3289 Other specified depressive episodes: Secondary | ICD-10-CM

## 2013-02-24 MED ORDER — ALBUTEROL SULFATE HFA 108 (90 BASE) MCG/ACT IN AERS: 2.0000 | INHALATION_SPRAY | Freq: Four times a day (QID) | RESPIRATORY_TRACT | Status: DC | PRN

## 2013-02-24 MED ORDER — LEVOTHYROXINE SODIUM 100 MCG PO TABS: 100.0000 ug | ORAL_TABLET | Freq: Every day | ORAL | Status: DC

## 2013-02-24 MED ORDER — LOSARTAN POTASSIUM 50 MG PO TABS: 50.0000 mg | ORAL_TABLET | Freq: Every day | ORAL | Status: DC

## 2013-02-24 MED ORDER — OMEPRAZOLE 20 MG PO CPDR: 20.0000 mg | DELAYED_RELEASE_CAPSULE | Freq: Every day | ORAL | Status: DC

## 2013-02-24 NOTE — Progress Notes (Signed)
Subjective:     Patient ID: [name], female   DOB: 1935-07-14, 78 y.o.   MRN: [id number]  HPI 78 y/o WF here for a follow up visit... she is followed both here and at the [place] where she gets her meds ([facility])... she has mult med problems including hx refractory AB requiring Advair, Ventolin, Mucinex;  Cerebrovas dis w/ right CAE 6/10 by DrLawson;  Hypothy on Synthroid;  GERD & Divertics w/ sigm colectomy 2003 for diverticulitis;  DJD w/ right TKR 2008 by DrAlusio; and Anxiety...  ~  October 09, 2010:  88mo ROV- she continues to get her meds at [facility] notes sl incr SOB/ DOE recently- hard for her to describe "I have the old asthma cough", no phlegm, no CP or cardiac symptoms;  we decided to recheck her CXR (COPD, mild scarring, cardiomeg, NAD) & PFT (more restricted than obstructed);  refill meds, discussed incr exerc program...    BP controlled on Losartan 50mg /d w/ BP= 130/82;  denies CP, palpit, edema, etc> and there are no cerebral ischemic symptoms (remains on ASA 81mg /d)...    Other medical problems remain stable>  Thyroid OK on Synth100 w/ TSH= 0.46;  GERD controlled w/ Prilosec & hx sigm colectomy for diverticulitis & ?when due for f/u colon (she will check w/ [facility] as DrWeissman has retired);  her GYN is Sports administrator & she tells me she saw DrKimbrough for UTIs but we don't have notes from him;  DJD & LBP followed by DrALusio (prev right TKR 2008);  still under incredible stress w/ grandsons critical illness (testic cancer surg then necrotizing fasciitis)...  ~  October 10, 2011:  Yearly ROV & [name] states that she is doing well w/o new complaints or concerns;  She wants refill prescriptions for [facility]- see meds below...    AB> she is NOT using the Advair, only the ProventilHFA as needed; no recent URIs or resp exac...    HBP> on Losartan50; BP= 150/84 & even better at home she says; denies HA, CP, palpit, SOB, edema, etc...    Cerebrovasc dis> on ASA daily; s/p right CAE 2010 by [facility]; she  has yearly f/u w/ [facility] & last seen 8/12 w/ Carotid Duplex showing patent right CAE w/o restenosis, & 0-39% left ICA stenosis w/ antegrade vertebrals...    Hypothy> on Synthroid100; TSH=0.92 & she is clinically & biochemically euthyroid...    GI> GERD on Prilosec20/d, Hx divertics w/ sigmoid colectomy 2003 by DrHoxworth; prev followed by DrWeissman- ?last colon 2003? Referred to [facility] for on-going care...    DJD> she is s/p right TKR 2008 by DrAlusio; known DJD in spine, & bursitis in right hip w/ shot from DrAlusio; she uses Vicodin & osteo-biflex...    Anxiety/ Depression> not on anxiolytic meds but under stress w/ family issues; she declines med rx... LABS 3/13:  FLP- she has excellent HDL; Chems- wnl;  CBC- wnl w/ Hg=12.1;  TSH=0.92 on Synth100;  VitD=41  ~  Dec 18, 2011:  52mo ROV & post ER follow up> After last visit we increased her Losartan to 100mg /d for BP but she says she "can't tolerate 100mg  doses" and she feels it caused cramping all over- went to ER & decreased to 1/2 tab w/ improvement back to baseline;  Review of records indicates ER visit 11/03/11 w/ 2wk hx incr SOB/DOE, she had hypoxemia & placed on O2 at 1L/min, CXR & labs were neg, note> prev hx AB on Advair that she stopped on her own, weight down  10# on Nutrisys diet, they cut her Cozaar to 50mg  due to Creat of 1.3 on labs...  Now she feels she is back to baseline since Losartan back down to 50mg  dose, O2 sats are normal & ambulatory O2 monitor here today on RA showed sat= 97% RA at rest (HR=89), and after 3 laps sat=96% RA (HR=102), therefore we will have the Oxygen picked up & she is pleased...  CXR 4/13 showed normal heart size, incr lung vols, clear x scarring left base, NAD... PFT 4/13 showed FVC= 2.45 (84%), FEV1= 1.83 (84%), %1sec= 75, mid-flows= 84% predicted... LABS 4/13:  Chems- ok x BUN=32 Creat=1.3;  CBC- ok w/ Hg=12.4  ~  August 27, 2012:  58mo ROV & pre-op clearance for DrAlusio's planned left TKR> but she's not sure  she wants it & found a supplement off the internet "Alleviate" that she is very hopeful will help her knee & prevent her from needing surg (I told he OK to try the supplement but don't cancel the surg already sched for 4/14 w/ DrAlusio... We reviewed the following medical problems during today's office visit >>     AB> she is NOT using Advair, has ProventilHFA as needed; no recent URIs or resp exac but c/o DOE w/ any activity, she is way too sedentary/ no exercise at all; O2 sat on RA=99% and she was ambulated in office- 3laps w/ O2sat nadir=96%; advised to start [facility] program [facility] or similar...    HBP> on Losartan50; BP= 124/80 & similar at home she says; denies HA, CP, palpit, ch in SOB, edema, etc...    Cerebrovasc dis> on ASA daily; s/p right CAE 2010 by [facility]; she has yearly f/u w/ [facility] & last seen 8/13 w/ Carotid Duplex showing patent right CAE w/o restenosis, & 0-39% left ICA stenosis w/ antegrade vertebrals (no change from last yr); she denies cerebral ischemic symptoms & continues f/u DrMatthews [facility] for stroke in right eye w/ visual impairment...    Hypothy> on Synthroid100; labs 3/13 showed TSH=0.92 & she is clinically & biochemically euthyroid...    GI> GERD on Prilosec20/d, Hx divertics w/ sigmoid colectomy 2003 by DrHoxworth; prev followed by DrWeissman- ?last colon 2003? Referred to DrKaplan for on-going care but she has yet to resched her colonoscopy & reminded to do so...    DJD> she is s/p right TKR 2008 by DrAlusio; known DJD in spine, & bursitis in right hip w/ shot from DrAlusio; she is bone on bone in the left knee & DrAlusio has sched TKR for 4/14- she wants to try supplement "Alleviate" first; she uses Vicodin & osteo-biflex as well; "I think I'm just getting old"...    Anxiety/ Depression> not on anxiolytic meds but under stress w/ family issues; she declines med rx... We reviewed prob list, meds, xrays and labs> see below for updates >> OK for surgery...  ~   February 24, 2013:  64mo ROV & post hosp check> she presented to the ER 01/02/13 w/ aphasia, drooling, & left facial droop that occurred while off her ASA for knee surg (on Xarelto after knee surg); all symptoms resolved by the time she was eval in the ER- CT Brain showed mild chr sm vessel dis, otherw wnl, NAD; she refused adm & was seen by [facility] in their office; she has hx right CAE by [facility] 6/10; DrSethi rec to restart ASA325, restart Lipitor she had stopped on her own (she refused but did agree to [employer]), & complete stroke  w/u as outpt> SEE BELOW...  We reviewed the following medical problems during today's office visit >>     AB> she is NOT using Advair, has ProventilHFA prn use; no recent URIs or resp exac but c/o DOE as before, she is way too sedentary/ no exercise at all; O2 sat on RA=97% and she was prev ambulated in office w/o desat; advised to start regular [facility] program thru [facility] or similar...    HBP> on Losartan50; BP= 150/64 but better at home she says; weight is down 23# to 168# today; DrMcLean tried to start HCT & K10 but she didn't follow through; denies HA, CP, palpit, ch in SOB, edema, etc; she had pre-op cardiac clearance from [facility] & Myoview was non-ischemic...    Cerebrovasc dis> on ASA325 daily; s/p right CAE 2010 by [facility]; she has yearly f/u w/ [facility] & last seen 8/13 w/ Carotid Duplex showing patent right CAE w/o restenosis, & 0-39% left ICA stenosis w/ antegrade vertebrals; she had TIA as above w/ neg outpt eval by [facility], & continues f/u DrMatthews [facility] for stroke in right eye w/ visual impairment...    CHOL> she refused the Lipitor20 perceiving some side effect & Neuro convinced her to try CRESTOR10=> due for FLP on the Cres10 soon...    Hypothy> on Synthroid100; labs 3/13 showed TSH=0.92 & she is clinically & biochemically euthyroid...    GI> GERD on Prilosec20/d, Hx divertics w/ sigmoid colectomy 2003 by DrHoxworth; prev followed by DrWeissman- ?last colon  2003? Referred to DrKaplan for on-going care but she has yet to resched her colonoscopy & reminded to do so...    DJD> she is s/p right TKR 2008 by DrAlusio; known DJD in spine, & bursitis in right hip w/ shot from DrAlusio; s/p left TKR 4/14 by DrAlusio & the knee is doing satis, but she had TIA while off the ASA (on Xarelto) in the post op period=> quickly resolved back to norm.    TIA> SEE ABOVE; she is now back on ASA325mg /d    Anxiety/ Depression> she is very tearful- "my husb has Alzheimers" & I again offered anxiolytic rx but she declines... We reviewed prob list, meds, xrays and labs> see below for updates >> SHE GETS ALL HER MEDS FROM [facility]...           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  BRONCHITIS, RECURRENT (ICD-491.9) - she is a non-smoker... episode refractory AB finally resolved 4/10 w/ Ab's/ Pred/ Mucinex/ etc; prev on Advair she stopped on her own & just uses AlbutHFA inhaler as needed...  HYPERTENSION (ICD-401.9) - on COZAAR 50mg /d... AORTIC VALVE DISEASE >>  ~  2DEcho 8/91 showed mild MR, trace AI, EF=60%... ~  NuclearStressTest 11/95 was normal...  ~  3/12:  BP= 120/70 & she says similar at home... takes med regularly and tol well... denies HA, fatigue, visual changes, CP, palipit, dizziness, syncope, dyspnea, edema, etc... ~  3/13:  BP= 150/84 & even better at home; she remains asymptomatic... ~  We attempted to incr the Losartan to 100mg /d but she states she can't tolerate a dose that high, went to ER w/ dyspnea, mild hypoxemia, & Creat=1.3; Cozaar reduced back to 50mg  & she states improved. ~  5/13:  BP= 158/72 but she says better at home & doesn't want additional meds... ~  1/14:  on Losartan50; BP= 124/80 & similar at home she says; denies HA, CP, palpit, ch in SOB, edema, etc... ~  Myoview 4/14 showed hypertensive BP  response, no scar or ischemia, normal wall motion, study not gated...  ~  2DEcho 6/14 showed norm wall motion & LVF w/ EF=55-65%, mild AS/ modAI/ mildMR, no ASD or PFO... ~  7/14: on Losartan50; BP= 150/64 but better at home she says; weight is down 23# to 168# today; denies HA, CP, palpit, ch in SOB, edema, etc.  CEREBROVASCULAR DISEASE (ICD-437.9) - on ASA 81mg /d... she has hx small vessel ischemic dis and has been seen by DrWillis for Neuro w/ hx amaurosis fugax in 2006... 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 ~  MRI Brain 2006 w/ sm vessel ischemic dis & 3mm intracavernous carotid aneurym- being followed... ~  CDopplers 3/08 showed bilat ICA plaque, 0-39% bilat stenoses (no change from prev). ~  CDopplers 3/09 showed 40-59% right ICA stenosis & 0-39% left ICA stenosis; she was asymptomatic... ~  CDopplers 3/10 showed mod plaque in prox ICAs bilat w/ 60-79% RICA & 0-39% LICA stenoses. ~  6/10: developed transient loss of vision in right eye- ophthal eval by DrMatthews & vasc eval DrLawson w/ repeat CDopplers showing 60-79% RICA stenosis, & 40-59% LICA stenosis ==> right RAE 6/10. ~  CDopplers followed by DrLawson's office since her surg & stable w/ patent right ICA endart site & 40-59% (low end) LICA stenosis felt to be stable & nonprogressive (done 7/11 & 8/12)... ~  on ASA daily; seen 8/13 w/ Carotid Duplex showing patent right CAE w/o restenosis, & 0-39% left ICA stenosis w/ antegrade vertebrals (no change from last yr); she denies cerebral ischemic symptoms... ~  MRA Neck 6/14 showed no signif stenosis, bilat antegrade vertebrals... ~  MRA Brain 6/14 showed no signif stenosis and prev described sm left carvernousICA aneurysm on 2006 scan was NOT seen currently...  HYPOTHYROIDISM (ICD-244.9) - on SYNTHROID 174mcg/d... feeling well, energy is good. ~  Labs 3/13 on Synth100 showed TSH= 0.92  GERD (ICD-530.81) - on PRILOSEC 20mg /d... GI=DrWeissman & last EGD 8/06 showed  esoph stricture- dilated... rec for Prilosec daily...   DIVERTICULOSIS OF COLON (ICD-562.10) - she is s/p sigmoid colectomy 6/03 by DrHoxworth... ? if she has had a f/u colonoscopy... ~  3/13:  She never ret to [employer], now retired... We will set her up to see DrKaplan for f/u colonoscopy... ~  1/14:  She has yet to sched the needed colonoscopy & reminded to do so...  DEGENERATIVE JOINT DISEASE (ICD-715.90) - know CSpine DJD w/ osteophytes on prev scan... she is s/p right TKR 1/08 by DrAlusio... ~  3/13:  She noted right hip pain & given shot for bursitis by DrAlusio... ~  4/14:  She had her left TKR by DrAlusio; knee is doing well- she had TIA while off the ASA in the perioperative period...  BACK PAIN, LUMBAR (ICD-724.2) - she takes ALEVE & VICODIN as needed.  TIA >> presented to ER 6/14 w/ aphasia, drooling, & left facial droop that occurred while off her ASA for knee surg; all symptoms resolved in & she refused hosp; outpt w/u by [facility]... ~  MRI Brain 6/14 showed chr microvasc ischemic dis w/ generalized atrophy, NAD...   ANXIETY (ICD-300.00) DEPRESSION (ICD-311) - she takes cranberry extract for bladder stem problems- OK... not on anxiety or depression meds but under considerable stress w/ grandson's illness...   Past Surgical History  Procedure  Laterality Date  . Vaginal hysterectomy    . Sigmoid colectomy    . Tubal ligation    . Replacement total knee      right knee  . Cardiothoracic procedure  01/13/2009    Right   . Right foot surgery       nerve cut between toes   . Endartarectomy      right carotid endartarectomy - 2010  . Total knee arthroplasty Left 11/17/2012    Procedure: TOTAL KNEE ARTHROPLASTY;  Surgeon: Loanne Drilling, MD;  Location: [facility];  Service: Orthopedics;  Laterality: Left;    Outpatient Encounter Prescriptions as of 02/24/2013  Medication Sig Dispense Refill  . albuterol (PROVENTIL HFA;VENTOLIN HFA) 108 (90 BASE) MCG/ACT inhaler  Inhale 2 puffs into the lungs every 6 (six) hours as needed for wheezing.      Marland Kitchen aspirin EC 325 MG tablet Take 1 tablet (325 mg total) by mouth daily.  30 tablet  0  . levothyroxine (SYNTHROID, LEVOTHROID) 100 MCG tablet Take 1 tablet (100 mcg total) by mouth daily before breakfast.  30 tablet  3  . losartan (COZAAR) 50 MG tablet Take 1 tablet (50 mg total) by mouth daily before breakfast.  30 tablet  2  . omeprazole (PRILOSEC) 20 MG capsule Take 1 capsule (20 mg total) by mouth daily.  30 capsule  3  . rosuvastatin (CRESTOR) 10 MG tablet Take 10 mg by mouth daily.      . [DISCONTINUED] simvastatin (ZOCOR) 10 MG tablet Take 1 tablet (10 mg total) by mouth at bedtime.  30 tablet  0  . [DISCONTINUED] simvastatin (ZOCOR) 10 MG tablet Take 1 tablet (10 mg total) by mouth at bedtime.  90 tablet  3   No facility-administered encounter medications on file as of 02/24/2013.    Allergies  Allergen Reactions  . Relafen (Nabumetone) Diarrhea and Other (See Comments)    GI / Urinary Bleeding  . Lipitor (Atorvastatin)     Causes memory loss  . Penicillins Hives, Itching, Swelling and Rash    Current Medications, Allergies, Past Medical History, Past Surgical History, Family History, and Social History were reviewed in [employer] record.   Review of Systems        The patient complains of dyspnea on exertion, gas/bloating, urinary hesitancy, nocturia, back pain, joint pain, arthritis, difficulty walking, and depression.  The patient denies fever, chills, sweats, anorexia, fatigue, weakness, malaise, weight loss, sleep disorder, blurring, diplopia, eye irritation, eye discharge, vision loss, eye pain, photophobia, earache, ear discharge, tinnitus, decreased hearing, nasal congestion, nosebleeds, sore throat, hoarseness, chest pain, palpitations, syncope, orthopnea, PND, peripheral edema, cough, dyspnea at rest, excessive sputum, hemoptysis, wheezing, pleurisy, nausea, vomiting,  diarrhea, constipation, change in bowel habits, abdominal pain, melena, hematochezia, jaundice, indigestion/heartburn, dysphagia, odynophagia, dysuria, hematuria, urinary frequency, incontinence, joint swelling, muscle cramps, muscle weakness, stiffness, sciatica, restless legs, leg pain at night, leg pain with exertion, rash, itching, dryness, suspicious lesions, paralysis, paresthesias, seizures, tremors, vertigo, transient blindness, frequent falls, frequent headaches, anxiety, memory loss, confusion, cold intolerance, heat intolerance, polydipsia, polyphagia, polyuria, unusual weight change, abnormal bruising, bleeding, enlarged lymph nodes, urticaria, allergic rash, hay fever, and recurrent infections.     Objective:   Physical Exam    WD, WN, 78 y/o WF in NAD... GENERAL:  Alert & oriented; pleasant & cooperative... HEENT:  [place]/AT, EOM-wnl, left marcus-gunn pupil, EACs-clear, TMs-wnl, NOSE-clear, THROAT-clear & wnl. NECK:  Supple w/ fairROM; no JVD; normal carotid impulses w/ faint right  bruit; no thyromegaly or nodules palpated; no lymphadenopathy. CHEST:  Clear to P & A; without wheezes/ rales/ or rhonchi heard... HEART:  Regular Rhythm; without murmurs/ rubs/ or gallops detected... ABDOMEN:  Soft & nontender; normal bowel sounds; no organomegaly or masses palpated... EXT: without deformities, mild arthritic changes; no varicose veins/ +venous insuffic/ no edema. NEURO:  no focal neuro deficits x part loss of vision left eye... DERM:  No lesions noted; no rash etc...  RADIOLOGY DATA:  Reviewed in the EPIC EMR & discussed w/ the patient...  LABORATORY DATA:  Reviewed in the EPIC EMR & discussed w/ the patient...     Assessment:      HBP>  Fair control on Losartan 50 & weight loss; refuses the 100mg  dose of any medication!; + diet discussed- low sodium...  Cerebrovasc Dis>  Followed by [facility] & stable s/p right CAE in 2010; continue ASA & [facility] follow up...  CHOL>  She refused the  Lipitor believing it caused some confusion; [facility] convinced her to try Cres10- so far so good...  Hypothyroid>  Stable on Synthroid 0.1mg /d; continue same...  GERD>  Stable on PPI Rx daily; continue Prilosec...  Divertics>  S/p sigmoid colectomy 2003; she is in need of GI f/u & 19yr colonoscopy- refer to DrKaplan...  DJD>  S/p bilat TKRs w/ known back pain & bursitis right hip- s/p shot per DrAlusio...  TIA>  She is stable back on her ASA, now 325mg /d w/ f/u [facility]...  Anxiety/ Depression>  Not on meds despite her considerable stress, and she feels she is doing satis & doesn't want med rx...     Plan:     Patient's Medications  New Prescriptions   No medications on file  Previous Medications   ASPIRIN EC 325 MG TABLET    Take 1 tablet (325 mg total) by mouth daily.   ROSUVASTATIN (CRESTOR) 10 MG TABLET    Take 10 mg by mouth daily.  Modified Medications   Modified Medication Previous Medication   ALBUTEROL (PROVENTIL HFA;VENTOLIN HFA) 108 (90 BASE) MCG/ACT INHALER albuterol (PROVENTIL HFA;VENTOLIN HFA) 108 (90 BASE) MCG/ACT inhaler      Inhale 2 puffs into the lungs every 6 (six) hours as needed for wheezing.    Inhale 2 puffs into the lungs every 6 (six) hours as needed for wheezing.   LEVOTHYROXINE (SYNTHROID, LEVOTHROID) 100 MCG TABLET levothyroxine (SYNTHROID, LEVOTHROID) 100 MCG tablet      Take 1 tablet (100 mcg total) by mouth daily before breakfast.    Take 1 tablet (100 mcg total) by mouth daily before breakfast.   LOSARTAN (COZAAR) 50 MG TABLET losartan (COZAAR) 50 MG tablet      TAKE 1 TABLET (50 MG TOTAL) BY MOUTH DAILY BEFORE BREAKFAST.    Take 1 tablet (50 mg total) by mouth daily before breakfast.   OMEPRAZOLE (PRILOSEC) 20 MG CAPSULE omeprazole (PRILOSEC) 20 MG capsule      Take 1 capsule (20 mg total) by mouth daily.    Take 1 capsule (20 mg total) by mouth daily.  Discontinued Medications   SIMVASTATIN (ZOCOR) 10 MG TABLET    Take 1 tablet (10 mg  total) by mouth at bedtime.   SIMVASTATIN (ZOCOR) 10 MG TABLET    Take 1 tablet (10 mg total) by mouth at bedtime.

## 2013-02-24 NOTE — Patient Instructions (Addendum)
Today we updated your med list in our EPIC system...    Continue your current medications the same...  We refilled your meds per request...  We discussed the dizziness you feel when you bend over- this is from a VALSALVA maneuver & you want to avoid this problem...    Do not hold your breath or "push out" when you bend over etc... Breath normally to equalize the pressure in your chest...  Call for any questions...  Let's plan a follow up visit in 3-86mo w/ FASTING blood work at that time.Marland KitchenMarland Kitchen

## 2013-03-07 ENCOUNTER — Other Ambulatory Visit: Payer: Self-pay | Admitting: Pulmonary Disease

## 2013-03-10 ENCOUNTER — Other Ambulatory Visit: Payer: Medicare Other

## 2013-03-10 ENCOUNTER — Ambulatory Visit: Payer: Medicare Other | Admitting: Neurosurgery

## 2013-04-05 ENCOUNTER — Other Ambulatory Visit: Payer: Self-pay | Admitting: Pulmonary Disease

## 2013-04-06 ENCOUNTER — Other Ambulatory Visit: Payer: Self-pay | Admitting: Pulmonary Disease

## 2013-04-29 ENCOUNTER — Encounter: Payer: Self-pay | Admitting: Cardiology

## 2013-05-13 ENCOUNTER — Ambulatory Visit (INDEPENDENT_AMBULATORY_CARE_PROVIDER_SITE_OTHER): Payer: Medicare Other | Admitting: Ophthalmology

## 2013-05-13 DIAGNOSIS — H34239 Retinal artery branch occlusion, unspecified eye: Secondary | ICD-10-CM

## 2013-05-13 DIAGNOSIS — I10 Essential (primary) hypertension: Secondary | ICD-10-CM | POA: Diagnosis not present

## 2013-05-13 DIAGNOSIS — H35039 Hypertensive retinopathy, unspecified eye: Secondary | ICD-10-CM

## 2013-05-13 DIAGNOSIS — H2510 Age-related nuclear cataract, unspecified eye: Secondary | ICD-10-CM

## 2013-05-13 DIAGNOSIS — H43819 Vitreous degeneration, unspecified eye: Secondary | ICD-10-CM | POA: Diagnosis not present

## 2013-05-18 ENCOUNTER — Other Ambulatory Visit (INDEPENDENT_AMBULATORY_CARE_PROVIDER_SITE_OTHER): Payer: Medicare Other

## 2013-05-18 ENCOUNTER — Ambulatory Visit (INDEPENDENT_AMBULATORY_CARE_PROVIDER_SITE_OTHER): Payer: Medicare Other | Admitting: Pulmonary Disease

## 2013-05-18 ENCOUNTER — Encounter: Payer: Self-pay | Admitting: Pulmonary Disease

## 2013-05-18 VITALS — BP 152/70 | HR 73 | Temp 97.5°F | Ht 65.0 in | Wt 176.6 lb

## 2013-05-18 DIAGNOSIS — E785 Hyperlipidemia, unspecified: Secondary | ICD-10-CM

## 2013-05-18 DIAGNOSIS — K5730 Diverticulosis of large intestine without perforation or abscess without bleeding: Secondary | ICD-10-CM | POA: Diagnosis not present

## 2013-05-18 DIAGNOSIS — R0609 Other forms of dyspnea: Secondary | ICD-10-CM

## 2013-05-18 DIAGNOSIS — K219 Gastro-esophageal reflux disease without esophagitis: Secondary | ICD-10-CM

## 2013-05-18 DIAGNOSIS — E039 Hypothyroidism, unspecified: Secondary | ICD-10-CM

## 2013-05-18 DIAGNOSIS — I10 Essential (primary) hypertension: Secondary | ICD-10-CM | POA: Diagnosis not present

## 2013-05-18 DIAGNOSIS — M545 Low back pain, unspecified: Secondary | ICD-10-CM

## 2013-05-18 DIAGNOSIS — M179 Osteoarthritis of knee, unspecified: Secondary | ICD-10-CM

## 2013-05-18 DIAGNOSIS — I679 Cerebrovascular disease, unspecified: Secondary | ICD-10-CM | POA: Diagnosis not present

## 2013-05-18 DIAGNOSIS — I6529 Occlusion and stenosis of unspecified carotid artery: Secondary | ICD-10-CM | POA: Diagnosis not present

## 2013-05-18 DIAGNOSIS — F329 Major depressive disorder, single episode, unspecified: Secondary | ICD-10-CM

## 2013-05-18 DIAGNOSIS — R5381 Other malaise: Secondary | ICD-10-CM | POA: Diagnosis not present

## 2013-05-18 DIAGNOSIS — E559 Vitamin D deficiency, unspecified: Secondary | ICD-10-CM

## 2013-05-18 DIAGNOSIS — M1990 Unspecified osteoarthritis, unspecified site: Secondary | ICD-10-CM

## 2013-05-18 DIAGNOSIS — R0600 Dyspnea, unspecified: Secondary | ICD-10-CM

## 2013-05-18 DIAGNOSIS — D649 Anemia, unspecified: Secondary | ICD-10-CM | POA: Diagnosis not present

## 2013-05-18 DIAGNOSIS — M1710 Unilateral primary osteoarthritis, unspecified knee: Secondary | ICD-10-CM

## 2013-05-18 DIAGNOSIS — G459 Transient cerebral ischemic attack, unspecified: Secondary | ICD-10-CM

## 2013-05-18 DIAGNOSIS — F411 Generalized anxiety disorder: Secondary | ICD-10-CM

## 2013-05-18 DIAGNOSIS — F3289 Other specified depressive episodes: Secondary | ICD-10-CM

## 2013-05-18 LAB — BASIC METABOLIC PANEL
Calcium: 9.4 mg/dL (ref 8.4–10.5)
GFR: 42.73 mL/min — ABNORMAL LOW (ref >60.00)
Glucose, Bld: 99 mg/dL (ref 70–99)
Potassium: 4.4 mEq/L (ref 3.5–5.1)
Sodium: 143 mEq/L (ref 135–145)

## 2013-05-18 LAB — HEPATIC FUNCTION PANEL
AST: 16 U/L (ref 0–37)
Albumin: 3.7 g/dL (ref 3.5–5.2)
Alkaline Phosphatase: 62 U/L (ref 39–117)
Total Bilirubin: 0.9 mg/dL (ref 0.3–1.2)

## 2013-05-18 LAB — CBC WITH DIFFERENTIAL/PLATELET
Eosinophils Relative: 7.9 % — ABNORMAL HIGH (ref 0.0–5.0)
HCT: 28.9 % — ABNORMAL LOW (ref 36.0–46.0)
Hemoglobin: 10 g/dL — ABNORMAL LOW (ref 12.0–15.0)
Lymphs Abs: 1.8 10*3/uL (ref 0.7–4.0)
MCV: 93.6 fl (ref 78.0–100.0)
Monocytes Absolute: 0.4 10*3/uL (ref 0.1–1.0)
Monocytes Relative: 7 % (ref 3.0–12.0)
Neutro Abs: 3.3 10*3/uL (ref 1.4–7.7)
RDW: 13.5 % (ref 11.5–14.6)
WBC: 6 10*3/uL (ref 4.5–10.5)

## 2013-05-18 LAB — IBC PANEL: Saturation Ratios: 19.6 % — ABNORMAL LOW (ref 20.0–50.0)

## 2013-05-18 LAB — LIPID PANEL
HDL: 76.3 mg/dL (ref >39.00)
LDL Cholesterol: 66 mg/dL (ref 0–99)
VLDL: 14 mg/dL (ref 0.0–40.0)

## 2013-05-18 MED ORDER — FLUTICASONE-SALMETEROL 250-50 MCG/DOSE IN AEPB: 1.0000 | INHALATION_SPRAY | Freq: Two times a day (BID) | RESPIRATORY_TRACT | Status: DC

## 2013-05-18 MED ORDER — LEVOTHYROXINE SODIUM 100 MCG PO TABS: ORAL_TABLET | ORAL | Status: DC

## 2013-05-18 MED ORDER — OMEPRAZOLE 20 MG PO CPDR: DELAYED_RELEASE_CAPSULE | ORAL | Status: DC

## 2013-05-18 MED ORDER — LOSARTAN POTASSIUM 100 MG PO TABS: 100.0000 mg | ORAL_TABLET | Freq: Every day | ORAL | Status: DC

## 2013-05-18 NOTE — Patient Instructions (Signed)
Today we updated your med list in our EPIC system...    Continue your current medications the same...  Today we did a Pulm function test to check your shortness of breath...  You are Deconditioned from not getting enough exercise & a big part of your eventual recovery will require you to enter into an exercise program...    We can consider a formal Pulmonary Rehab Program at [facility] out pt...    Or we can try a community based program like [facility]...    Let me know your decision so you can start your exercise program right away...  We decided to add [id number]- one inhalation twice daily on a regular basis...    Continue the Albuterol rescue inhaler as needed...  We also decided to INCREASE the LOSARTAN to 100mg  daily...   Today we did your follow up FASTING blood work...    We will contact you w/ the results when available...   Call for any questions...  Let's plan a follow up visit in 50mo, sooner if needed for problems.Marland KitchenMarland Kitchen

## 2013-05-18 NOTE — Progress Notes (Signed)
Subjective:     Patient ID: [name], female   DOB: 09/04/1934, 78 y.o.   MRN: [id number]  HPI 78 y/o WF here for a follow up visit... she is followed both here and at the [place] where she gets her meds ([facility])... she has mult med problems including hx refractory AB requiring Advair, Ventolin, Mucinex;  Cerebrovas dis w/ right CAE 6/10 by DrLawson;  Hypothy on Synthroid;  GERD & Divertics w/ sigm colectomy 2003 for diverticulitis;  DJD w/ right TKR 2008 by DrAlusio; and Anxiety...  ~  Dec 18, 2011:  54mo ROV & post ER follow up> After last visit we increased her Losartan to 100mg /d for BP but she says she "can't tolerate 100mg  doses" and she feels it caused cramping all over- went to ER & decreased to 1/2 tab w/ improvement back to baseline;  Review of records indicates ER visit 11/03/11 w/ 2wk hx incr SOB/DOE, she had hypoxemia & placed on O2 at 1L/min, CXR & labs were neg, note> prev hx AB on Advair that she stopped on her own, weight down 10# on Nutrisys diet, they cut her Cozaar to 50mg  due to Creat of 1.3 on labs...  Now she feels she is back to baseline since Losartan back down to 50mg  dose, O2 sats are normal & ambulatory O2 monitor here today on RA showed sat= 97% RA at rest (HR=89), and after 3 laps sat=96% RA (HR=102), therefore we will have the Oxygen picked up & she is pleased...   CXR 4/13 showed normal heart size, incr lung vols, clear x scarring left base, NAD...  PFT 4/13 showed FVC= 2.45 (84%), FEV1= 1.83 (84%), %1sec= 75, mid-flows= 84% predicted...  LABS 4/13:  Chems- ok x BUN=32 Creat=1.3;  CBC- ok w/ Hg=12.4  ~  August 27, 2012:  73mo ROV & pre-op clearance for DrAlusio's planned left TKR> but she's not sure she wants it & found a supplement off the internet "Alleviate" that she is very hopeful will help her knee & prevent her from needing surg (I told he OK to try the supplement but don't cancel the surg already sched for 4/14 w/ DrAlusio... We reviewed the following medical  problems during today's office visit >>     AB> she is NOT using Advair, has ProventilHFA as needed; no recent URIs or resp exac but c/o DOE w/ any activity, she is way too sedentary/ no exercise at all; O2 sat on RA=99% and she was ambulated in office- 3laps w/ O2sat nadir=96%; advised to start regular [facility] program [facility] or similar...    HBP> on Losartan50; BP= 124/80 & similar at home she says; denies HA, CP, palpit, ch in SOB, edema, etc...    Cerebrovasc dis> on ASA daily; s/p right CAE 2010 by Windell Moulding; she has yearly f/u w/ [facility] & last seen 8/13 w/ Carotid Duplex showing patent right CAE w/o restenosis, & 0-39% left ICA stenosis w/ antegrade vertebrals (no change from last yr); she denies cerebral ischemic symptoms & continues f/u DrMatthews [facility] for stroke in right eye w/ visual impairment...    Hypothy> on Synthroid100; labs 3/13 showed TSH=0.92 & she is clinically & biochemically euthyroid...    GI> GERD on Prilosec20/d, Hx divertics w/ sigmoid colectomy 2003 by DrHoxworth; prev followed by DrWeissman- ?last colon 2003? Referred to DrKaplan for on-going care but she has yet to resched her colonoscopy & reminded to do so...    DJD> she is s/p right TKR 2008 by DrAlusio;  known DJD in spine, & bursitis in right hip w/ shot from DrAlusio; she is bone on bone in the left knee & DrAlusio has sched TKR for 4/14- she wants to try supplement "Alleviate" first; she uses Vicodin & osteo-biflex as well; "I think I'm just getting old"...    Anxiety/ Depression> not on anxiolytic meds but under stress w/ family issues; she declines med rx... We reviewed prob list, meds, xrays and labs> see below for updates >> OK for surgery...  ~  February 24, 2013:  35mo ROV & post hosp check> she presented to the ER 01/02/13 w/ aphasia, drooling, & left facial droop that occurred while off her ASA for knee surg (on Xarelto after knee surg); all symptoms resolved by the time she was eval in the ER- CT Brain showed mild chr  sm vessel dis, otherw wnl, NAD; she refused adm & was seen by [facility] in their office; she has hx right CAE by [facility] 6/10; DrSethi rec to restart ASA325, restart Lipitor she had stopped on her own (she refused but did agree to Cres10), & complete stroke w/u as outpt> SEE BELOW...  We reviewed the following medical problems during today's office visit >>     AB> she is NOT using Advair, has ProventilHFA prn use; no recent URIs or resp exac but c/o DOE as before, she is way too sedentary/ no exercise at all; O2 sat on RA=97% and she was prev ambulated in office w/o desat; advised to start regular exercise program thru [facility] or similar...    HBP> on Losartan50; BP= 150/64 but better at home she says; weight is down 23# to 168# today; DrMcLean tried to start HCT & K10 but she didn't follow through; denies HA, CP, palpit, ch in SOB, edema, etc; she had pre-op cardiac clearance from [facility] & Myoview was non-ischemic...    Cerebrovasc dis> on ASA325 daily; s/p right CAE 2010 by [facility]; she has yearly f/u w/ [facility] & last seen 8/13 w/ Carotid Duplex showing patent right CAE w/o restenosis, & 0-39% left ICA stenosis w/ antegrade vertebrals; she had TIA as above w/ neg outpt eval by [facility], & continues f/u DrMatthews [facility] for stroke in right eye w/ visual impairment...    CHOL> she refused the Lipitor20 perceiving some side effect & Neuro convinced her to try CRESTOR10=> due for FLP on the Cres10 soon...    Hypothy> on Synthroid100; labs 3/13 showed TSH=0.92 & she is clinically & biochemically euthyroid...    GI> GERD on Prilosec20/d, Hx divertics w/ sigmoid colectomy 2003 by DrHoxworth; prev followed by DrWeissman- ?last colon 2003? Referred to DrKaplan for on-going care but she has yet to resched her colonoscopy & reminded to do so...    DJD> she is s/p right TKR 2008 by DrAlusio; known DJD in spine, & bursitis in right hip w/ shot from DrAlusio; s/p left TKR 4/14 by DrAlusio & the knee is doing  satis, but she had TIA while off the ASA (on Xarelto) in the post op period=> quickly resolved back to norm.    TIA> SEE ABOVE; she is now back on ASA325mg /d    Anxiety/ Depression> she is very tearful- "my husb has Alzheimers" & I again offered anxiolytic rx but she declines... We reviewed prob list, meds, xrays and labs> see below for updates >> SHE GETS ALL HER MEDS FROM [facility]...  ~  May 18, 2013:  24mo ROV & [name] is c/o SOB, DOE that has been  building up for a yr she says, "it's not my weight" which is up 9# to 177# today; she denies cough, sputum, hemoptysis, CP, etc; but she has noted some wheezing even though she says "my asthma is doing ok"; NOTE: prev PFTs 5/13 were wnl and CXR 4/14 was essentially clear, NAD;  we decided to obtain a diagnostic work up w/ CXR, PFT, Ambulatory O2 sat eval, and Labs... As noted prev- she is not using her Advair, just the Albut HFA inhaler prn... She has an agenda today- she wants ADVAIR restarted "like before"; but in light of today's evaluation she is rec to start exercise program, join [employer] vs [employer]ne[employer], start MVI/ Fe/ VitD2000...    We reviewed prob list, meds, xrays and labs> see below for updates >> OK 2014 Flu vaccine today...   Last CXR was 4/14> norm heart size, clear lungs x left basilar scarring, mild DJD Tspine, NAD...  PFTs 10/14 showed FVC=1.68 (58%), FEV1=1.29 (60%), %1sec=77, mid-flows=64% predicted; c/w mild sm airways dis & prob mild restriction...   Ambulatory O2 test>  On RA- O2sat=96% at rest w/ HR=81;  Exercised 2Laps w/ lowest O2sat=96% w/ HR=96...  LABS 10/14:  FLP- at goals on Simva10 from the [place];  Chems- wnl w/ Cr=1.3;  CBC- sl anemic w/ Hg=10.0, MCV=94. Fe=78 (20%sat);  TSH=0.38 on Synth100;  VitD=35;  She is rec to take MVI, Fe, VitD2000...           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  BRONCHITIS, RECURRENT (ICD-491.9) - she is a non-smoker...  ~  episode refractory AB finally resolved 4/10 w/ Ab's/ Pred/ Mucinex/ etc; prev on Advair she stopped on her own & just uses AlbutHFA inhaler as needed... ~  PFTs 5/13 showed  ~  CXR 4/14 showed  ~  10/14:  Presents w/ c/o 75yr slowly progressive dyspnea "no breath at all" but exam was neg & work up revealed ?pulm restriction vs poor effort and deconditioning> she wanted Advair restarted-ok, plus rec for exercise program [facility] vs [employer]neakers, along w/ MVI, Fe, VitD2000u/d...   HYPERTENSION (ICD-401.9) - on COZAAR 50mg /d... AORTIC VALVE DISEASE >>  ~  2DEcho 8/91 showed mild MR, trace AI, EF=60%... ~  NuclearStressTest 11/95 was normal...  ~  3/12:  BP= 120/70 & she says similar at home... takes med regularly and tol well... denies HA, fatigue, visual changes, CP, palipit, dizziness, syncope, dyspnea, edema, etc... ~  3/13:  BP= 150/84 & even better at home; she remains asymptomatic... ~  We attempted to incr the Losartan to 100mg /d but she states she can't tolerate a dose that high, went to ER w/ dyspnea, mild hypoxemia, & Creat=1.3; Cozaar reduced back to 50mg  & she states improved. ~  5/13:  BP= 158/72 but she says better at home & doesn't want additional meds... ~  1/14:  on Losartan50; BP= 124/80 & similar at home she says; denies HA, CP, palpit, ch in SOB, edema, etc... ~  Myoview 4/14 showed hypertensive BP response, no scar or ischemia, normal wall motion, study not gated...  ~  2DEcho 6/14 showed norm wall motion & LVF w/ EF=55-65%, mild AS/ modAI/ mildMR, no ASD or PFO... ~  7/14: on Losartan50; BP= 150/64 but better at home she says; weight is down 23# to 168# today; denies HA, CP, palpit, ch  in SOB, edema, etc. ~  10/14: on Losar50; BP= 152/70 and she is rec to increase to 100mg /d, monitor BP at home...  CEREBROVASCULAR DISEASE (ICD-437.9) - on ASA daily... she has hx small vessel ischemic dis  and has been seen by DrWillis for Neuro w/ hx amaurosis fugax in 2006... 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 ~  MRI Brain 2006 w/ sm vessel ischemic dis & 3mm intracavernous carotid aneurym- being followed... ~  CDopplers 3/08 showed bilat ICA plaque, 0-39% bilat stenoses (no change from prev). ~  CDopplers 3/09 showed 40-59% right ICA stenosis & 0-39% left ICA stenosis; she was asymptomatic... ~  CDopplers 3/10 showed mod plaque in prox ICAs bilat w/ 60-79% RICA & 0-39% LICA stenoses. ~  6/10: developed transient loss of vision in right eye- ophthal eval by DrMatthews & vasc eval DrLawson w/ repeat CDopplers showing 60-79% RICA stenosis, & 40-59% LICA stenosis ==> right RAE 6/10. ~  CDopplers followed by DrLawson's office since her surg & stable w/ patent right ICA endart site & 40-59% (low end) LICA stenosis felt to be stable & nonprogressive (done 7/11 & 8/12)... ~  on ASA daily; seen 8/13 w/ Carotid Duplex showing patent right CAE w/o restenosis, & 0-39% left ICA stenosis w/ antegrade vertebrals (no change from last yr); she denies cerebral ischemic symptoms... ~  MRA Neck 6/14 showed no signif stenosis, bilat antegrade vertebrals... ~  MRA Brain 6/14 showed no signif stenosis and prev described sm left carvernousICA aneurysm on 2006 scan was NOT seen currently... ~  She continues on ASA325 daily w/o cerebral ischemic symptoms...  HYPOTHYROIDISM (ICD-244.9) - on SYNTHROID 123mcg/d... feeling well, energy is good. ~  Labs 3/13 on Synth100 showed TSH= 0.92 ~  Labs 10/14 on Synth100 showed TSH= 0.38  GERD (ICD-530.81) - on PRILOSEC 20mg /d... GI=DrWeissman & last EGD 8/06 showed esoph stricture- dilated... rec for Prilosec daily...   DIVERTICULOSIS OF COLON (ICD-562.10) - she is s/p sigmoid colectomy 6/03 by DrHoxworth... ? if she has had a f/u colonoscopy... ~  3/13:  She never ret to [employer], now retired... We will set her up to see DrKaplan for f/u colonoscopy... ~   1/14:  She has yet to sched the needed colonoscopy & reminded to do so... ~  10/14:  Again reminded of the need for f/u colonoscopy, she says she will call to set this up...  DEGENERATIVE JOINT DISEASE (ICD-715.90) - know CSpine DJD w/ osteophytes on prev scan... she is s/p right TKR 1/08 by DrAlusio... ~  3/13:  She noted right hip pain & given shot for bursitis by DrAlusio... ~  4/14:  She had her left TKR by DrAlusio; knee is doing well- she had TIA while off the ASA in the perioperative period...  BACK PAIN, LUMBAR (ICD-724.2) - she takes ALEVE & VICODIN as needed.  TIA >> presented to ER 6/14 w/ aphasia, drooling, & left facial droop that occurred while off her ASA for knee surg; all symptoms resolved in & she refused hosp; outpt w/u by [facility]... ~  MRI Brain 6/14 showed chr microvasc ischemic dis w/ generalized atrophy, NAD...  ~  She remains on ASA325mg /d w/o cerebral ischemic symptoms...  ANXIETY (ICD-300.00) DEPRESSION (ICD-311) - she takes cranberry extract for bladder stem problems- OK... not on anxiety or depression meds but under considerable stress w/ grandson's illness...   Past Surgical History  Procedure Laterality Date  . Vaginal hysterectomy    . Sigmoid colectomy    .  Tubal ligation    . Replacement total knee      right knee  . Cardiothoracic procedure  01/13/2009    Right   . Right foot surgery       nerve cut between toes   . Endartarectomy      right carotid endartarectomy - 2010  . Total knee arthroplasty Left 11/17/2012    Procedure: TOTAL KNEE ARTHROPLASTY;  Surgeon: Loanne Drilling, MD;  Location: [facility];  Service: Orthopedics;  Laterality: Left;    Outpatient Encounter Prescriptions as of 05/18/2013  Medication Sig Dispense Refill  . albuterol (PROVENTIL HFA;VENTOLIN HFA) 108 (90 BASE) MCG/ACT inhaler Inhale 2 puffs into the lungs every 6 (six) hours as needed for wheezing.  1 Inhaler  6  . aspirin EC 325 MG tablet Take 1 tablet (325 mg  total) by mouth daily.  30 tablet  0  . levothyroxine (SYNTHROID, LEVOTHROID) 100 MCG tablet TAKE 1 TABLET (100 MCG TOTAL) BY MOUTH DAILY BEFORE BREAKFAST.  30 tablet  3  . losartan (COZAAR) 50 MG tablet TAKE 1 TABLET (50 MG TOTAL) BY MOUTH DAILY BEFORE BREAKFAST.  30 tablet  2  . omeprazole (PRILOSEC) 20 MG capsule TAKE 1 CAPSULE (20 MG TOTAL) BY MOUTH DAILY.  30 capsule  3  . rosuvastatin (CRESTOR) 10 MG tablet Take 10 mg by mouth daily.       No facility-administered encounter medications on file as of 05/18/2013.    Allergies  Allergen Reactions  . Relafen [Nabumetone] Diarrhea and Other (See Comments)    GI / Urinary Bleeding  . Lipitor [Atorvastatin]     Causes memory loss  . Penicillins Hives, Itching, Swelling and Rash    Current Medications, Allergies, Past Medical History, Past Surgical History, Family History, and Social History were reviewed in [employer] record.   Review of Systems        The patient complains of dyspnea on exertion, gas/bloating, urinary hesitancy, nocturia, back pain, joint pain, arthritis, difficulty walking, and depression.  The patient denies fever, chills, sweats, anorexia, fatigue, weakness, malaise, weight loss, sleep disorder, blurring, diplopia, eye irritation, eye discharge, vision loss, eye pain, photophobia, earache, ear discharge, tinnitus, decreased hearing, nasal congestion, nosebleeds, sore throat, hoarseness, chest pain, palpitations, syncope, orthopnea, PND, peripheral edema, cough, dyspnea at rest, excessive sputum, hemoptysis, wheezing, pleurisy, nausea, vomiting, diarrhea, constipation, change in bowel habits, abdominal pain, melena, hematochezia, jaundice, indigestion/heartburn, dysphagia, odynophagia, dysuria, hematuria, urinary frequency, incontinence, joint swelling, muscle cramps, muscle weakness, stiffness, sciatica, restless legs, leg pain at night, leg pain with exertion, rash, itching, dryness, suspicious  lesions, paralysis, paresthesias, seizures, tremors, vertigo, transient blindness, frequent falls, frequent headaches, anxiety, memory loss, confusion, cold intolerance, heat intolerance, polydipsia, polyphagia, polyuria, unusual weight change, abnormal bruising, bleeding, enlarged lymph nodes, urticaria, allergic rash, hay fever, and recurrent infections.     Objective:   Physical Exam    WD, WN, 78 y/o WF in NAD... GENERAL:  Alert & oriented; pleasant & cooperative... HEENT:  [place]/AT, EOM-wnl, left marcus-gunn pupil, EACs-clear, TMs-wnl, NOSE-clear, THROAT-clear & wnl. NECK:  Supple w/ fairROM; no JVD; normal carotid impulses w/ faint right bruit; no thyromegaly or nodules palpated; no lymphadenopathy. CHEST:  Clear to P & A; without wheezes/ rales/ or rhonchi heard... HEART:  Regular Rhythm; without murmurs/ rubs/ or gallops detected... ABDOMEN:  Soft & nontender; normal bowel sounds; no organomegaly or masses palpated... EXT: without deformities, mild arthritic changes; no varicose veins/ +venous insuffic/ no edema. NEURO:  no focal  neuro deficits x part loss of vision left eye... DERM:  No lesions noted; no rash etc...  RADIOLOGY DATA:  Reviewed in the EPIC EMR & discussed w/ the patient...  LABORATORY DATA:  Reviewed in the EPIC EMR & discussed w/ the patient...     Assessment:     DYSPNEA>  Eval reveals mild restriction w/o underlying pulm parenchymal dis, suspect multifactorial etiology- deconditioned, stress, anemia, etc; she insists on restarting her ADVAIR 250- 1 puff Bid- ok; plus needs exercise program (Rehab vs silver sneakers), consider Rebeca Allegra (she declines), plus MVI/ Fe/ VitD2000u/d...   HBP>  Fair control on Losartan 50 & again rec to incr to 100mg  dose for better control; + diet discussed- low sodium...  Cerebrovasc Dis>  Followed by [facility] & stable s/p right CAE in 2010; continue ASA & [facility] follow up...  CHOL>  She refused the Lipitor believing it caused some  confusion; [facility] convinced her to try Cres10 but she gets her meds from [facility] & they changed her to Simva20-1/2 tab per day...  Hypothyroid>  Stable on Synthroid 0.1mg /d; continue same...  GERD>  Stable on PPI Rx daily; continue Prilosec...  Divertics>  S/p sigmoid colectomy 2003; she is in need of GI f/u & 4yr colonoscopy- refer to DrKaplan...  DJD>  S/p bilat TKRs w/ known back pain & bursitis right hip- s/p shot per DrAlusio...  TIA>  She is stable back on her ASA, now 325mg /d w/ f/u [facility]...  Anxiety/ Depression>  Not on meds despite her considerable stress, and she feels she is doing satis & doesn't want med rx...     Plan:

## 2013-05-19 LAB — VITAMIN D 25 HYDROXY (VIT D DEFICIENCY, FRACTURES): Vit D, 25-Hydroxy: 35 ng/mL (ref 30–89)

## 2013-07-30 DIAGNOSIS — J189 Pneumonia, unspecified organism: Secondary | ICD-10-CM

## 2013-07-30 HISTORY — DX: Pneumonia, unspecified organism: J18.9

## 2013-08-24 ENCOUNTER — Ambulatory Visit: Payer: Medicare Other | Admitting: Nurse Practitioner

## 2013-08-25 ENCOUNTER — Encounter: Payer: Self-pay | Admitting: Nurse Practitioner

## 2013-08-25 ENCOUNTER — Ambulatory Visit (INDEPENDENT_AMBULATORY_CARE_PROVIDER_SITE_OTHER): Payer: Medicare Other | Admitting: Nurse Practitioner

## 2013-08-25 ENCOUNTER — Encounter (INDEPENDENT_AMBULATORY_CARE_PROVIDER_SITE_OTHER): Payer: Self-pay

## 2013-08-25 VITALS — BP 139/66 | HR 73 | Ht 66.0 in | Wt 180.0 lb

## 2013-08-25 DIAGNOSIS — R259 Unspecified abnormal involuntary movements: Secondary | ICD-10-CM | POA: Diagnosis not present

## 2013-08-25 DIAGNOSIS — G459 Transient cerebral ischemic attack, unspecified: Secondary | ICD-10-CM

## 2013-08-25 DIAGNOSIS — R251 Tremor, unspecified: Secondary | ICD-10-CM

## 2013-08-25 MED ORDER — ALPRAZOLAM 0.25 MG PO TABS: 0.2500 mg | ORAL_TABLET | Freq: Three times a day (TID) | ORAL | Status: DC | PRN

## 2013-08-25 NOTE — Patient Instructions (Signed)
PLAN:  Start Xanax 0.25 mg TID for anxiety and tremor. Continue aspirin 325 mg for stroke prevention and strict control of hypertension with blood pressure goal below 130/90 and elevated lipids with LDL cholesterol goal below 100 mg percent.  Return for followup in 1 months with NP and call earlier if necessary.

## 2013-08-25 NOTE — Progress Notes (Signed)
PATIENT: [name] DOB: 1935/06/09   REASON FOR VISIT: follow up for TIA HISTORY FROM: patient  HISTORY OF PRESENT ILLNESS: PRIOR HPI (PS): 79 year Caucasian lady who had a transient episode of sudden onset dysarthria, left facial droop and drooling of saliva from the left corner of mouth, lasting 30 minutes on 01/03/13. She had stopped her aspirin recently for her knee surgery which has since been restarted after the episode. She went to [facility] emergency room and had a CT scan of the head which was unremarkable. She was advised admission and TIA workup t but the patient refused both. She does have a history of stroke 4 years ago when she lost partial vision in the right eye and was found to have symptomatic carotid stenosis for which he underwent carotid endarterectomy. She is followed by Dr. Kellie Simmering and states her last carotid ultrasound done 2 months ago had shown some mild left ICA stenosis but I do not have those results. She was also on Lipitor which she stopped 6 days ago for unclear reasons. She states her blood pressure is under good control. She does not smoke.   UPDATE 02/19/13 (LL): Patient returns to office for follow up, no neurologic complaints or episodes since last TIA on 01/03/13. She has stopped taking Lipitor due to side effect profile and she thinks her memory is worse since starting it. Her MRA neck showed no significant stenosis at either carotid bifurcation. MRI shows chronic microvascular changes. Her husband has been diagnosed with Alzheimer's and she is tearful talking about it. She is no longer taking Xarelto for DVT prophylaxis after knee replacement, has gone back to 81 mg ASA.   UPDATE 08/25/13 (LL):  She returns for follow up for TIA. She has had no recurrent TIA symptoms and has been feeling well.  Since last visit she has developed an action tremor in right hand, which is bothersome when she eats.  She notices tremor in her right foot as well.  She denies  excessive caffeine use, only 1 cup in the am.  She endorses a high level of stress and does not sleep well.  REVIEW OF SYSTEMS: Full 14 system review of systems performed and notable only for: ringing in ears, shortness of breath, tremors  ALLERGIES: Allergies  Allergen Reactions  . Relafen [Nabumetone] Diarrhea and Other (See Comments)    GI / Urinary Bleeding  . Lipitor [Atorvastatin]     Causes memory loss  . Penicillins Hives, Itching, Swelling and Rash    HOME MEDICATIONS: Outpatient Prescriptions Prior to Visit  Medication Sig Dispense Refill  . albuterol (PROVENTIL HFA;VENTOLIN HFA) 108 (90 BASE) MCG/ACT inhaler Inhale 2 puffs into the lungs every 6 (six) hours as needed for wheezing.  1 Inhaler  6  . aspirin EC 325 MG tablet Take 1 tablet (325 mg total) by mouth daily.  30 tablet  0  . Cholecalciferol (VITAMIN D) 2000 UNITS CAPS Take 1 capsule by mouth daily.      . Cranberry-Vitamin C-Probiotic (AZO CRANBERRY) 250-30 MG TABS Take 1 capsule by mouth daily.      . ferrous sulfate 325 (65 FE) MG tablet Take 325 mg by mouth daily with breakfast.      . fish oil-omega-3 fatty acids 1000 MG capsule Take 2 g by mouth daily.      . Fluticasone-Salmeterol (ADVAIR DISKUS) 250-50 MCG/DOSE AEPB Inhale 1 puff into the lungs 2 (two) times daily.  3 each  3  . levothyroxine (  SYNTHROID, LEVOTHROID) 100 MCG tablet TAKE 1 TABLET (100 MCG TOTAL) BY MOUTH DAILY BEFORE BREAKFAST.  90 tablet  3  . losartan (COZAAR) 100 MG tablet Take 1 tablet (100 mg total) by mouth daily.  90 tablet  3  . Multiple Vitamins-Minerals (WOMENS MULTIVITAMIN PLUS PO) Take 1 tablet by mouth daily.      . naproxen sodium (ANAPROX) 220 MG tablet Take 220 mg by mouth 2 (two) times daily with a meal.      . Nutritional Supplements (OSTEO ADVANCE) TABS Take 1 tablet by mouth daily.      Marland Kitchen omeprazole (PRILOSEC) 20 MG capsule TAKE 1 CAPSULE (20 MG TOTAL) BY MOUTH DAILY.  90 capsule  3  . simvastatin (ZOCOR) 10 MG tablet Take 10  mg by mouth at bedtime.       No facility-administered medications prior to visit.    PAST MEDICAL HISTORY: Past Medical History  Diagnosis Date  . History of sudden visual loss   . Acute cystitis   . Bronchitis, acute   . Hypertension   . Cerebrovascular disease   . Hypothyroidism   . GERD (gastroesophageal reflux disease)   . Diverticulosis of colon   . DJD (degenerative joint disease)   . Back pain   . Transient ischemic attack   . Anxiety   . Depression   . Asthma   . COPD (chronic obstructive pulmonary disease)   . Shortness of breath     with exertion   . Pneumonia     hx of x 2   . 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 UTI (lower urinary tract infection)     hx of   . Cancer     hxof precancerous lesion on nose     PAST SURGICAL HISTORY: Past Surgical History  Procedure Laterality Date  . Vaginal hysterectomy    . Sigmoid colectomy    . Tubal ligation    . Replacement total knee      right knee  . Cardiothoracic procedure  01/13/2009    Right   . Right foot surgery       nerve cut between toes   . Endartarectomy      right carotid endartarectomy - 2010  . Total knee arthroplasty Left 11/17/2012    Procedure: TOTAL KNEE ARTHROPLASTY;  Surgeon: Gearlean Alf, MD;  Location: [facility];  Service: Orthopedics;  Laterality: Left;    FAMILY HISTORY: Family History  Problem Relation Age of Onset  . Emphysema Father   . Heart disease Mother   . Coronary artery disease Sister   . Heart disease Sister   . Rheumatic fever Brother     x2  . Heart attack Brother     SOCIAL HISTORY: History   Social History  . Marital Status: Married    Spouse Name: charles    Number of Children: 3  . Years of Education: N/A   Occupational History  . Not on file.   Social History Main Topics  . Smoking status: Never Smoker   . Smokeless tobacco: Never Used  . Alcohol Use: No  . Drug Use: No  . Sexual Activity: Not on file   Other Topics Concern  . Not  on file   Social History Narrative  . No narrative on file     PHYSICAL EXAM  Filed Vitals:   08/25/13 1451  BP: 139/66  Pulse: 73  Height: 5\' 6"  (1.676 m)  Weight: 180 lb (81.647 kg)   Body mass index is 29.07 kg/(m^2).  General: well developed, well nourished elderly Caucasian lady, seated, in no evident distress, NEGATIVE GLABELLAR TAP Head: head normocephalic and atraumatic. Orohparynx benign  Neck: supple with no carotid or supraclavicular bruits  Cardiovascular: regular rate and rhythm, 2/6 systolic murmur  Musculoskeletal: no deformity.  Skin: no rash/petichiae, surgical scar in right neck from CEA and left knee from recent surgery.  Vascular: Normal pulses all extremities   Neurologic Exam  Mental Status: Awake and fully alert. Oriented to place and time. Recent and remote memory intact. Attention span, concentration and fund of knowledge appropriate. Mood and affect appropriate.  Cranial Nerves: Fundoscopic exam reveals sharp disc margins. Pupils equal, briskly reactive to light.Right eye has large scotoma in nasal inferior quadrant. Extraocular movements full without nystagmus. Visual fields full to confrontation. Hearing intact. Facial sensation intact. Face, tongue, palate moves normally and symmetrically.  Motor: Normal bulk and tone. Normal strength in all tested extremity muscles. Right hand tremor with action and fine resting tremor in right foot. NEGATIVE COGWHEELING IN BILATERAL WRISTS Sensory: intact to touch and pinprick  Coordination: Rapid alternating movements normal in all extremities. Finger-to-nose and heel-to-shin performed accurately bilaterally.  Gait and Station: Arises from chair without difficulty. Stance is normal. Gait demonstrates normal stride length and balance. UNABLE to heel, toe and tandem walk without difficulty.  Reflexes: 1+ and symmetric. Toes downgoing.   DIAGNOSTIC DATA (LABS, IMAGING, TESTING) - I reviewed patient records, labs,  notes, testing and imaging myself where available.  Lab Results  Component Value Date   WBC 6.0 05/18/2013   HGB 10.0* 05/18/2013   HCT 28.9* 05/18/2013   MCV 93.6 05/18/2013   PLT 299.0 05/18/2013      Component Value Date/Time   NA 143 05/18/2013 1003   K 4.4 05/18/2013 1003   CL 108 05/18/2013 1003   CO2 28 05/18/2013 1003   GLUCOSE 99 05/18/2013 1003   BUN 18 05/18/2013 1003   CREATININE 1.3* 05/18/2013 1003   CALCIUM 9.4 05/18/2013 1003   PROT 7.4 05/18/2013 1003   ALBUMIN 3.7 05/18/2013 1003   AST 16 05/18/2013 1003   ALT 9 05/18/2013 1003   ALKPHOS 62 05/18/2013 1003   BILITOT 0.9 05/18/2013 1003   GFRNONAA 21* 01/02/2013 2129   GFRAA 24* 01/02/2013 2129   Lab Results  Component Value Date   CHOL 156 05/18/2013   HDL 76.30 05/18/2013   LDLCALC 66 05/18/2013   LDLDIRECT 103.1 10/10/2011   TRIG 70.0 05/18/2013   CHOLHDL 2 05/18/2013   No results found for this basename: HGBA1C   No results found for this basename: VITAMINB12   Lab Results  Component Value Date   TSH 0.38 05/18/2013    ASSESSMENT AND PLAN 79 year old lady with a right brain TIA on 01/03/13 likely from small vessel disease with vascular risk factors of hypertension and prior cerebrovascular disease and carotid stenosis s/p right endarterectomy.  New essential tremor in right hand and resting tremor in right leg.  PLAN:  Start Xanax 0.25 mg TID for anxiety and tremor. Continue aspirin 325 mg for stroke prevention and strict control of hypertension with blood pressure goal below 130/90 and elevated lipids with LDL cholesterol goal below 100 mg percent. Patient refuses take Lipitor due to side effects.  Return for followup in 1 months with NP and call earlier if necessary.  Meds ordered this encounter  Medications  . ALPRAZolam (XANAX) 0.25 MG tablet  Sig: Take 1 tablet (0.25 mg total) by mouth 3 (three) times daily as needed for anxiety.    Dispense:  90 tablet    Refill:  2    Order Specific  Question:  Supervising Provider    Answer:  Garvin Fila [[id number]]   No Follow-up on file.  Philmore Pali, MSN, NP-C 08/25/2013, 3:17 PM [facility] [address], [place], [place] [address] [phone]  Note: This document was prepared with digital dictation and possible smart phrase technology. Any transcriptional errors that result from this process are unintentional.

## 2013-09-10 ENCOUNTER — Telehealth: Payer: Self-pay | Admitting: Pulmonary Disease

## 2013-09-10 NOTE — Telephone Encounter (Signed)
Called and spoke with pt and she stated that she is needing to get a handicap placard.  Pt stated that she is ok at home but when she goes out she gets very winded and stated that its hard for her to park the car and walk long distances.  Pt would like to pick this up on Monday.  SN please advise if you are ok to sign the handicap placard.  Pt stated that she did not go to the pulmonary rehab due to all the flu that was going around in the hospital.  Pt wanted SN to know this.  Thanks  Allergies  Allergen Reactions  . Relafen [Nabumetone] Diarrhea and Other (See Comments)    GI / Urinary Bleeding  . Lipitor [Atorvastatin]     Causes memory loss  . Penicillins Hives, Itching, Swelling and Rash

## 2013-09-11 NOTE — Telephone Encounter (Signed)
Form has been signed by SN and i have called and made the pt aware. She will come by on Monday to pick this up.

## 2013-09-14 ENCOUNTER — Telehealth: Payer: Self-pay | Admitting: Pulmonary Disease

## 2013-09-14 NOTE — Telephone Encounter (Signed)
lmtcb x1 w/ spouse 

## 2013-09-17 NOTE — Telephone Encounter (Signed)
Called and spoke with pt. She reports she did not call us and did not need anything. Will sign off message

## 2013-09-21 ENCOUNTER — Encounter: Payer: Self-pay | Admitting: Pulmonary Disease

## 2013-09-21 ENCOUNTER — Ambulatory Visit (INDEPENDENT_AMBULATORY_CARE_PROVIDER_SITE_OTHER): Payer: Medicare Other | Admitting: Pulmonary Disease

## 2013-09-21 VITALS — BP 158/70 | HR 80 | Temp 97.0°F | Ht 65.0 in | Wt 182.2 lb

## 2013-09-21 DIAGNOSIS — R0600 Dyspnea, unspecified: Secondary | ICD-10-CM

## 2013-09-21 DIAGNOSIS — G459 Transient cerebral ischemic attack, unspecified: Secondary | ICD-10-CM | POA: Diagnosis not present

## 2013-09-21 DIAGNOSIS — E785 Hyperlipidemia, unspecified: Secondary | ICD-10-CM

## 2013-09-21 DIAGNOSIS — R0609 Other forms of dyspnea: Secondary | ICD-10-CM | POA: Diagnosis not present

## 2013-09-21 DIAGNOSIS — K5730 Diverticulosis of large intestine without perforation or abscess without bleeding: Secondary | ICD-10-CM

## 2013-09-21 DIAGNOSIS — R0989 Other specified symptoms and signs involving the circulatory and respiratory systems: Secondary | ICD-10-CM

## 2013-09-21 DIAGNOSIS — I679 Cerebrovascular disease, unspecified: Secondary | ICD-10-CM

## 2013-09-21 DIAGNOSIS — F411 Generalized anxiety disorder: Secondary | ICD-10-CM

## 2013-09-21 DIAGNOSIS — R5381 Other malaise: Secondary | ICD-10-CM | POA: Diagnosis not present

## 2013-09-21 DIAGNOSIS — E039 Hypothyroidism, unspecified: Secondary | ICD-10-CM

## 2013-09-21 DIAGNOSIS — M1990 Unspecified osteoarthritis, unspecified site: Secondary | ICD-10-CM

## 2013-09-21 DIAGNOSIS — R251 Tremor, unspecified: Secondary | ICD-10-CM

## 2013-09-21 DIAGNOSIS — K219 Gastro-esophageal reflux disease without esophagitis: Secondary | ICD-10-CM

## 2013-09-21 DIAGNOSIS — M545 Low back pain, unspecified: Secondary | ICD-10-CM

## 2013-09-21 DIAGNOSIS — I6529 Occlusion and stenosis of unspecified carotid artery: Secondary | ICD-10-CM | POA: Diagnosis not present

## 2013-09-21 DIAGNOSIS — I10 Essential (primary) hypertension: Secondary | ICD-10-CM

## 2013-09-21 MED ORDER — LORAZEPAM 0.5 MG PO TABS: ORAL_TABLET | ORAL | Status: DC

## 2013-09-21 MED ORDER — LOSARTAN POTASSIUM 100 MG PO TABS: 100.0000 mg | ORAL_TABLET | Freq: Every day | ORAL | Status: DC

## 2013-09-21 NOTE — Patient Instructions (Signed)
Today we updated your med list in our EPIC system...    Continue your current medications the same...  We added ATIVAN 0.5mg  - take 1/2 to 1 tab twice daily for the shaking & to help your shortness of breath...  Today we gave you a follow up combination TETANUS shot called the TDAP (this is good for 10 yrs)...  We will make a referral to Pulmonary Rehab to get you enrolled...   Call for any questions...  Let's plan a follow up visit in 73mo, sooner if needed for problems.Marland KitchenMarland Kitchen

## 2013-09-21 NOTE — Progress Notes (Signed)
Subjective:     Patient ID: [name], female   DOB: 1934-09-11, 79 y.o.   MRN: [id number]  HPI 79 y/o WF here for a follow up visit... she is followed both here and at the [place] where she gets her meds ([facility])... she has mult med problems including hx refractory AB requiring Advair, Ventolin, Mucinex;  Cerebrovas dis w/ right CAE 6/10 by DrLawson;  Hypothy on Synthroid;  GERD & Divertics w/ sigm colectomy 2003 for diverticulitis;  DJD w/ right TKR 2008 by DrAlusio; and Anxiety...  ~  Dec 18, 2011:  20moROV & post ER follow up> After last visit we increased her Losartan to 1024md for BP but she says she "can't tolerate 10024moses" and she feels it caused cramping all over- went to ER & decreased to 1/2 tab w/ improvement back to baseline;  Review of records indicates ER visit 11/03/11 w/ 2wk hx incr SOB/DOE, she had hypoxemia & placed on O2 at 1L/min, CXR & labs were neg, note> prev hx AB on Advair that she stopped on her own, weight down 10# on Nutrisys diet, they cut her Cozaar to 81m55me to Creat of 1.3 on labs...  Now she feels she is back to baseline since Losartan back down to 81mg19me, O2 sats are normal & ambulatory O2 monitor here today on RA showed sat= 97% RA at rest (HR=89), and after 3 laps sat=96% RA (HR=102), therefore we will have the Oxygen picked up & she is pleased...  CXR 4/13 showed normal heart size, incr lung vols, clear x scarring left base, NAD... PFT 4/13 showed FVC= 2.45 (84%), FEV1= 1.83 (84%), %1sec= 75, mid-flows= 84% predicted... LABS 4/13:  Chems- ok x BUN=32 Creat=1.3;  CBC- ok w/ Hg=12.4  ~  August 27, 2012:  19mo R36mo pre-op clearance for DrAlusio's planned left TKR> but she's not sure she wants it & found a supplement off the internet "Alleviate" that she is very hopeful will help her knee & prevent her from needing surg (I told he OK to try the supplement but don't cancel the surg already sched for 4/14 w/ DrAlusio... We reviewed the following medical problems  during today's office visit >>     AB> she is NOT using Advair, has ProventilHFA as needed; no recent URIs or resp exac but c/o DOE w/ any activity, she is way too sedentary/ no exercise at all; O2 sat on RA=99% and she was ambulated in office- 3laps w/ O2sat nadir=96%; advised to [facility] or [facility]...    HBP> on Losartan50; BP= 124/80 & similar at home she says; denies HA, CP, palpit, ch in SOB, edema, etc...    Cerebrovasc dis> on ASA daily; s/p right CAE 2010 by DrLawsSheryn Bisonhas yearly f/u w/ [facility] & last seen 8/13 w/ Carotid Duplex showing patent right CAE w/o restenosis, & 0-39% left ICA stenosis w/ antegrade vertebrals (no change from last yr); she denies cerebral ischemic symptoms & continues f/u DrMatthews [facility] for stroke in right eye w/ visual impairment...    Hypothy> on Synthroid100; labs 3/13 showed TSH=0.92 & she is clinically & biochemically euthyroid...    GI> GERD on Prilosec20/d, Hx divertics w/ sigmoid colectomy 2003 by DrHoxworth; prev followed by DrWeissman- ?last colon 2003? [place] but she has yet to resched her colonoscopy & reminded to do so...    DJD> she is s/p right TKR 2008 by DrAlusio; known DJD in spine, &  bursitis in right hip w/ shot from DrAlusio; she is bone on bone in the left knee & DrAlusio has sched TKR for 4/14- she wants to try supplement "Alleviate" first; she uses Vicodin & osteo-biflex as well; "I think I'm just getting old"...    Anxiety/ Depression> not on anxiolytic meds but under stress w/ family issues; she declines med rx... We reviewed prob list, meds, xrays and labs> see below for updates >> OK for surgery...  ~  February 24, 2013:  61moROV & post hosp check> she presented to the ER 01/02/13 w/ aphasia, drooling, & left facial droop that occurred while off her ASA for knee surg (on Xarelto after knee surg); all symptoms resolved by the time she was eval in the ER- CT Brain showed mild chr sm vessel  dis, otherw wnl, NAD; she refused adm & was seen by [facility] in their office; [facility] DrSethi rec to restart ASA325, restart Lipitor she had stopped on her own (she refused but did agree to Cres10), & complete stroke w/u as outpt> SEE BELOW...  We reviewed the following medical problems during today's office visit >>     AB> she is NOT using Advair, has ProventilHFA prn use; no recent URIs or resp exac but c/o DOE as before, she is way too sedentary/ no exercise at all; O2 sat on RA=97% and she was prev ambulated in office w/o desat; advised to start regular exercise program thru [facility] or similar...    HBP> on Losartan50; BP= 150/64 but better at home she says; weight is down 23# to 168# today; DrMcLean tried to start HCT & K10 but she didn't follow through; denies HA, CP, palpit, ch in SOB, edema, etc; she [place]...    Cerebrovasc dis> on ASA325 daily; [facility] w/ [facility] & last seen 8/13 w/ Carotid Duplex showing patent right CAE w/o restenosis, & 0-39% left ICA stenosis w/ antegrade vertebrals; she had TIA as above w/ neg outpt eval by [facility], & continues f/u DrMatthews [facility] for stroke in right eye w/ visual impairment...    CHOL> she refused the Lipitor20 perceiving some side effect & Neuro convinced her to try CRESTOR10=> due for FLP on the Cres10 soon...    Hypothy> on Synthroid100; labs 3/13 showed TSH=0.92 & she is clinically & biochemically euthyroid...    GI> GERD on Prilosec20/d, Hx divertics w/ sigmoid colectomy 2003 by DrHoxworth; prev followed by DrWeissman- ?last colon 2003? [place] but she has yet to resched her colonoscopy & reminded to do so...    DJD> she is s/p right TKR 2008 by DrAlusio; known DJD in spine, & bursitis in right hip w/ shot from DrAlusio; s/p left TKR 4/14 by DrAlusio & the knee is doing satis, but  she had TIA while off the ASA (on Xarelto) in the post op period=> quickly resolved back to norm.    TIA> SEE ABOVE; she is now back on ASA3241md    Anxiety/ Depression> she is very tearful- "my husb has Alzheimers" & I again offered anxiolytic rx but she declines... We reviewed prob list, meds, xrays and labs> see below for updates >> SHE GETS ALL HER MEDS FROM [facility]...  ~ May 18, 2013: 28m128moV & PeaNykeria c/o SOB, DOE that has been building up for a yr she says, "  it's not my weight" which is up 9# to 177# today; she denies cough, sputum, hemoptysis, CP, etc; but she has noted some wheezing even though she says "my asthma is doing ok"; NOTE: prev PFTs 5/13 were wnl and CXR 4/14 was essentially clear, NAD; we decided to obtain a diagnostic work up w/ CXR, PFT, Ambulatory O2 sat eval, and Labs... As noted prev- she is not using her Advair, just the Albut HFA inhaler prn... She has an agenda today- she wants ADVAIR restarted "like before"; but in light of today's evaluation she is rec to start exercise program, join [employer] vs [place], start MVI/ Fe/ VitD2000...  We reviewed prob list, meds, xrays and labs> see below for updates >> OK 2014 Flu vaccine today...   Last CXR was 4/14> norm heart size, clear lungs x left basilar scarring, mild DJD Tspine, NAD...  PFTs 10/14 showed FVC=1.68 (58%), FEV1=1.29 (60%), %1sec=77, mid-flows=64% predicted; c/w mild sm airways dis & prob mild restriction...   Ambulatory O2 test> On RA- O2sat=96% at rest w/ HR=81; Exercised 2Laps w/ lowest O2sat=96% w/ HR=96...  LABS 10/14: FLP- at goals on Simva10 from the [place]; Chems- wnl w/ Cr=1.3; CBC- sl anemic w/ Hg=10.0, MCV=94. Fe=78 (20%sat); TSH=0.38 on Synth100; VitD=35; She is rec to take MVI, Fe, VitD2000...  ~  September 21, 2013:  15moROV & Terris indicates that she has not followed our instructions after her last visit to start a regular exercise program- no PulmRehab (refused due to all the flu going  around), no Silver Sneakers, no home exercise on her own; she also notes that she is not any better than before- we reviewed the results of her 10/14 eval & their interpretation; she requests a small portable O2 tank to take w/ her when out & about but we discussed the expense since she doesn't qualify under Medicare; she tells me that DrSethi tried her on Xanax but it was too strong & made her too sleepy "my shaking is really bad" (no tremor noticed on today's exam); she tells me that she is now ready for PulmRehab program and will try ATIVAN 0.578m 1/2 to 1 tab Tid...     She is also c/o neck pain, back pain, & rec to take OTC anti-inflamm rx + heat & f/u w/ her Ortho, DrAlusio...     We reviewed prob list, meds, xrays and labs> see below for updates >> Given TDAP today...           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  BRONCHITIS, RECURRENT (ICD-491.9) - she is a non-smoker...  DYSPNEA >>  ~  episode refractory AB finally resolved 4/10 w/ Ab's/ Pred/ Mucinex/ etc; prev on Advair she stopped on her own & just uses AlbutHFA inhaler as needed... ~  10/14:  Presents w/ c/o 1y46yrowly progressive dyspnea "no breath at all" but exam was neg & work up revealed ?pulm restriction vs poor effort and deconditioning> she wanted Advair restarted-ok, plus rec for exercise program PulmRehab vs Silver Sneakers, along w/ MVI, Fe, VitD2000u/d...  ~  2/15:  She notes persistent symptoms but never followed recommendations from 10/14; Rec to start exercise program & consider Ativan 0.5mg3md...  HYPERTENSION (ICD-401.9) - on COZAAR 50mg41m. AORTIC VALVE DISEASE >>  ~  2DEcho 8/91 showed mild MR, trace AI, EF=60%... ~  NuclearStressTest 11/95 was normal...  ~  3/12:  BP= 120/70 & she says similar at home... takes med regularly and tol well... denies HA, fatigue, visual  changes, CP, palipit, dizziness, syncope, dyspnea, edema, etc... ~  3/13:  BP= 150/84 & even better at home; she remains asymptomatic... ~  We attempted to incr the Losartan to 110m/d but she states she can't tolerate a dose that high, went to ER w/ dyspnea, mild hypoxemia, & Creat=1.3; Cozaar reduced back to 533m& she states improved. ~  5/13:  BP= 158/72 but she says better at home & doesn't want additional meds... ~  1/14:  on Losartan50; BP= 124/80 & similar at home she says; denies HA, CP, palpit, ch in SOB, edema, etc... ~  Myoview 4/14 showed hypertensive BP response, no scar or ischemia, normal wall motion, study not gated...  ~  2DEcho 6/14 showed norm wall motion & LVF w/ EF=55-65%, mild AS/ modAI/ mildMR, no ASD or PFO... ~  7/14: on Losartan50; BP= 150/64 but better at home she says; weight is down 23# to 168# today; denies HA, CP, palpit, ch in SOB, edema, etc. ~ 10/14: on Losar50; BP= 152/70 and she is rec to increase to 10060m, monitor BP at home... ~  2/15: on Losar100; BP= 150/70 & weight is up a further 6#; we reviewed low sodium weight reducing diet...  CEREBROVASCULAR DISEASE (ICD-437.9) - on ASA 23m34m.. she has hx small vessel ischemic dis and has been seen by DrWillis for Neuro w/ hx amaurosis fugax in 2006... 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 ~  MRI Brain 2006 w/ sm vessel ischemic dis & 3mm i83macavernous carotid aneurym- being followed... ~  CDopplers 3/08 showed bilat ICA plaque, 0-39% bilat stenoses (no change from prev). ~  CDopplers 3/09 showed 40-59% right ICA stenosis & 0-39% left ICA stenosis; she was asymptomatic... ~  CDopplers 3/10 showed mod plaque in prox ICAs bilat w/ 60-79%29-56%& 0-39% 2-13%stenoses. ~  6/10: developed transient loss of vision in right eye- ophthal eval by DrMatthews & vasc eval DrLawson w/ repeat CDopplers showing 60-79% RICA stenosis, & 40-59%08-65%stenosis ==> right RAE 6/10. ~  CDopplers followed by DrLawson's  office since her surg & stable w/ patent right ICA endart site & 40-59%78-46%end) LICA stenosis felt to be stable & nonprogressive (done 7/11 & 8/12)... ~  on ASA daily; seen 8/13 w/ Carotid Duplex showing patent right CAE w/o restenosis, & 0-39% left ICA stenosis w/ antegrade vertebrals (no change from last yr); she denies cerebral ischemic symptoms... ~  MRA Neck 6/14 showed no signif stenosis, bilat antegrade vertebrals... ~  MRA Brain 6/14 showed no signif stenosis and prev described sm left carvernousICA aneurysm on 2006 scan was NOT seen currently... ~  She continues on ASA325 daily w/o cerebral ischemic symptoms...  HYPOTHYROIDISM (ICD-244.9) - on SYNTHROID 100mcg/2m feeling well, energy is good. ~  Labs 3/13 on Synth100 showed TSH= 0.92 ~ Labs 10/14 on Synth100 showed TSH= 0.38  GERD (ICD-530.81) - on PRILOSEC 20mg/d.42mI=DrWeissman & last EGD 8/06 showed esoph stricture- dilated... rec for Prilosec daily...   DIVERTICULOSIS OF COLON (ICD-562.10) - she is s/p sigmoid colectomy 6/03 by DrHoxworth... ? if she has had a f/u colonoscopy... ~  3/13:  She never ret to DrWeissmIAC/[facility]... We will set her up to see DrKaplan for f/u colonoscopy... ~  1/14:  She has yet to sched the needed colonoscopy & reminded to do so... ~  10/14: Again reminded of the need for f/u colonoscopy, she says she will call to set this up...  DEGENERATIVE JOINT DISEASE (ICD-715.90) - know CSpine DJD w/ osteophytes on prev scan... she is s/p right TKR 1/08 by DrAlusio... ~  3/13:  She noted right hip pain & given shot for bursitis by DrAlusio... ~  4/14:  She had her left TKR by DrAlusio; knee is doing well- she had TIA while off the ASA in the perioperative period... ~  2/15: she is c/o neck & back pain & will f/u w/ Ortho...  BACK PAIN, LUMBAR (ICD-724.2) - she takes [place] as needed.  TIA >> presented to ER 6/14 w/ aphasia, drooling, & left facial droop that occurred while off her ASA for knee  surg; all symptoms resolved in 64mn & she refused hosp; [facility] ~  MRI Brain 6/14 showed chr microvasc ischemic dis w/ generalized atrophy, NAD...  ~ She remains on ASA3280md w/o cerebral ischemic symptoms...  ANXIETY (ICD-300.00) DEPRESSION (ICD-311) - she takes cranberry extract for bladder stem problems- OK... not on anxiety or depression meds but under considerable stress w/ grandson's illness... ~  2/15:  DrSethi tried her on Xanax 0.21m73mut it was too strong she says; rec to try ATIVAN 0.21mg36m/2 to 1 tab Tid as directed...   Past Surgical History  Procedure Laterality Date  . Vaginal hysterectomy    . Sigmoid colectomy    . Tubal ligation    . Replacement total knee      right knee  . Cardiothoracic procedure  01/13/2009    Right   . Right foot surgery       nerve cut between toes   . Endartarectomy      right carotid endartarectomy - 2010  . Total knee arthroplasty Left 11/17/2012    Procedure: TOTAL KNEE ARTHROPLASTY;  Surgeon: FranGearlean Alf;  Location: [facility];  Service: Orthopedics;  Laterality: Left;    Outpatient Encounter Prescriptions as of 09/21/2013  Medication Sig  . albuterol (PROVENTIL HFA;VENTOLIN HFA) 108 (90 BASE) MCG/ACT inhaler Inhale 2 puffs into the lungs every 6 (six) hours as needed for wheezing.  . asMarland Kitchenirin EC 325 MG tablet Take 1 tablet (325 mg total) by mouth daily.  . Cholecalciferol (VITAMIN D) 2000 UNITS CAPS Take 1 capsule by mouth daily.  . Cranberry-Vitamin C-Probiotic (AZO CRANBERRY) 250-30 MG TABS Take 1 capsule by mouth daily.  . ferrous sulfate 325 (65 FE) MG tablet Take 325 mg by mouth daily with breakfast.  . fish oil-omega-3 fatty acids 1000 MG capsule Take 2 g by mouth daily.  . Fluticasone-Salmeterol (ADVAIR DISKUS) 250-50 MCG/DOSE AEPB Inhale 1 puff into the lungs 2 (two) times daily.  . leMarland Kitchenothyroxine (SYNTHROID, LEVOTHROID) 100 MCG tablet TAKE 1 TABLET (100 MCG TOTAL) BY MOUTH DAILY BEFORE BREAKFAST.  .  lMarland Kitchensartan (COZAAR) 100 MG tablet Take 1 tablet (100 mg total) by mouth daily.  . naproxen sodium (ANAPROX) 220 MG tablet Take 220 mg by mouth 2 (two) times daily with a meal.  . Nutritional Supplements (OSTEO ADVANCE) TABS Take 1 tablet by mouth daily.  . omMarland Kitchenprazole (PRILOSEC) 20 MG capsule TAKE 1 CAPSULE (20 MG TOTAL) BY MOUTH DAILY.  . Multiple Vitamins-Minerals (WOMENS MULTIVITAMIN PLUS PO) Take 1 tablet by mouth daily.  . [DISCONTINUED] ALPRAZolam (XANAX) 0.25 MG tablet Take 1 tablet (0.25 mg total) by mouth 3 (three) times daily as needed for anxiety.    Allergies  Allergen Reactions  .  Relafen [Nabumetone] Diarrhea and Other (See Comments)    GI / Urinary Bleeding  . Lipitor [Atorvastatin]     Causes memory loss  . Penicillins Hives, Itching, Swelling and Rash    Current Medications, Allergies, Past Medical History, Past Surgical History, Family History, and Social History were reviewed in [employer] record.   Review of Systems        The patient complains of dyspnea on exertion, gas/bloating, urinary hesitancy, nocturia, back pain, joint pain, arthritis, difficulty walking, and depression.  The patient denies fever, chills, sweats, anorexia, fatigue, weakness, malaise, weight loss, sleep disorder, blurring, diplopia, eye irritation, eye discharge, vision loss, eye pain, photophobia, earache, ear discharge, tinnitus, decreased hearing, nasal congestion, nosebleeds, sore throat, hoarseness, chest pain, palpitations, syncope, orthopnea, PND, peripheral edema, cough, dyspnea at rest, excessive sputum, hemoptysis, wheezing, pleurisy, nausea, vomiting, diarrhea, constipation, change in bowel habits, abdominal pain, melena, hematochezia, jaundice, indigestion/heartburn, dysphagia, odynophagia, dysuria, hematuria, urinary frequency, incontinence, joint swelling, muscle cramps, muscle weakness, stiffness, sciatica, restless legs, leg pain at night, leg pain with exertion,  rash, itching, dryness, suspicious lesions, paralysis, paresthesias, seizures, tremors, vertigo, transient blindness, frequent falls, frequent headaches, anxiety, memory loss, confusion, cold intolerance, heat intolerance, polydipsia, polyphagia, polyuria, unusual weight change, abnormal bruising, bleeding, enlarged lymph nodes, urticaria, allergic rash, hay fever, and recurrent infections.     Objective:   Physical Exam    WD, WN, 79 y/o WF in NAD... GENERAL:  Alert & oriented; pleasant & cooperative... HEENT:  [place]/AT, EOM-wnl, left marcus-gunn pupil, EACs-clear, TMs-wnl, NOSE-clear, THROAT-clear & wnl. NECK:  Supple w/ fairROM; no JVD; normal carotid impulses w/ faint right bruit; no thyromegaly or nodules palpated; no lymphadenopathy. CHEST:  Clear to P & A; without wheezes/ rales/ or rhonchi heard... HEART:  Regular Rhythm; without murmurs/ rubs/ or gallops detected... ABDOMEN:  Soft & nontender; normal bowel sounds; no organomegaly or masses palpated... EXT: without deformities, mild arthritic changes; no varicose veins/ +venous insuffic/ no edema. NEURO:  no focal neuro deficits x part loss of vision left eye... DERM:  No lesions noted; no rash etc...  RADIOLOGY DATA:  Reviewed in the EPIC EMR & discussed w/ the patient...  LABORATORY DATA:  Reviewed in the EPIC EMR & discussed w/ the patient...     Assessment:     DYSPNEA> Eval reveals mild restriction w/o underlying pulm parenchymal dis, suspect multifactorial etiology- deconditioned, stress, anemia, etc; she insists on restarting her ADVAIR 250- 1 puff Bid- ok; plus needs exercise program (Rehab vs silver sneakers), consider ATIVAN 0.28m- 1/2 to 1 tab Tid, plus MVI/ Fe/ VitD2000u/d...   HBP>  Fair control on Losartan 100 & we reviewed diet, wt reduction & low sodium...  Cerebrovasc Dis>  [facility] s/p right CAE in 2010; continue ASA & [facility] follow up...  CHOL>  She refused the Lipitor believing it caused some  confusion; [facility] convinced her to try Cres10 but [facility] changed her to Simva20-1/2 tab daily...  Hypothyroid>  Stable on Synthroid 0.179md; continue same...  GERD>  Stable on PPI Rx daily; continue Prilosec...  Divertics>  S/p sigmoid colectomy 2003; she is in need of GI f/u & 1081yrlonoscopy- refer to DrKOberlin  DJD>  S/p bilat TKRs w/ known back pain & bursitis right hip- s/p shot per DrAlusio...  TIA>  She is stable back on her ASA, now 325m82mw/ f/u [facility]...  Anxiety/ Depression>  Not on meds despite her considerable stress, and she feels she is  doing satis & doesn't want med rx...     Plan:     Patient's Medications  New Prescriptions   LORAZEPAM (ATIVAN) 0.5 MG TABLET    Take 1/2 to 1 tablet by mouth two times daily  Previous Medications   ALBUTEROL (PROVENTIL HFA;VENTOLIN HFA) 108 (90 BASE) MCG/ACT INHALER    Inhale 2 puffs into the lungs every 6 (six) hours as needed for wheezing.   ASPIRIN EC 325 MG TABLET    Take 1 tablet (325 mg total) by mouth daily.   CHOLECALCIFEROL (VITAMIN D) 2000 UNITS CAPS    Take 1 capsule by mouth daily.   CRANBERRY-VITAMIN C-PROBIOTIC (AZO CRANBERRY) 250-30 MG TABS    Take 1 capsule by mouth daily.   FERROUS SULFATE 325 (65 FE) MG TABLET    Take 325 mg by mouth daily with breakfast.   FISH OIL-OMEGA-3 FATTY ACIDS 1000 MG CAPSULE    Take 2 g by mouth daily.   FLUTICASONE-SALMETEROL (ADVAIR DISKUS) 250-50 MCG/DOSE AEPB    Inhale 1 puff into the lungs 2 (two) times daily.   LEVOTHYROXINE (SYNTHROID, LEVOTHROID) 100 MCG TABLET    TAKE 1 TABLET (100 MCG TOTAL) BY MOUTH DAILY BEFORE BREAKFAST.   MULTIPLE VITAMINS-MINERALS (WOMENS MULTIVITAMIN PLUS PO)    Take 1 tablet by mouth daily.   NAPROXEN SODIUM (ANAPROX) 220 MG TABLET    Take 220 mg by mouth 2 (two) times daily with a meal.   NUTRITIONAL SUPPLEMENTS (OSTEO ADVANCE) TABS    Take 1 tablet by mouth daily.   OMEPRAZOLE (PRILOSEC) 20 MG CAPSULE    TAKE 1 CAPSULE (20 MG TOTAL) BY  MOUTH DAILY.  Modified Medications   Modified Medication Previous Medication   LOSARTAN (COZAAR) 100 MG TABLET losartan (COZAAR) 100 MG tablet      Take 1 tablet (100 mg total) by mouth daily.    Take 1 tablet (100 mg total) by mouth daily.  Discontinued Medications   ALPRAZOLAM (XANAX) 0.25 MG TABLET    Take 1 tablet (0.25 mg total) by mouth 3 (three) times daily as needed for anxiety.

## 2013-09-23 ENCOUNTER — Other Ambulatory Visit: Payer: Self-pay | Admitting: Vascular Surgery

## 2013-09-23 DIAGNOSIS — I6529 Occlusion and stenosis of unspecified carotid artery: Secondary | ICD-10-CM

## 2013-09-23 DIAGNOSIS — Z48812 Encounter for surgical aftercare following surgery on the circulatory system: Secondary | ICD-10-CM

## 2013-09-25 ENCOUNTER — Telehealth: Payer: Self-pay | Admitting: *Deleted

## 2013-09-25 NOTE — Telephone Encounter (Signed)
Pt has appt with Dr. Jenny Reichmann to set up for new primary care on 11/18/2013

## 2013-10-21 ENCOUNTER — Encounter (HOSPITAL_COMMUNITY): Payer: Self-pay

## 2013-10-21 ENCOUNTER — Encounter (HOSPITAL_COMMUNITY)
Admission: RE | Admit: 2013-10-21 | Discharge: 2013-10-21 | Disposition: A | Discharge status: Home / Self Care | Payer: Medicare Other | Source: Ambulatory Visit | Attending: Pulmonary Disease | Admitting: Pulmonary Disease

## 2013-10-21 DIAGNOSIS — Z8673 Personal history of transient ischemic attack (TIA), and cerebral infarction without residual deficits: Secondary | ICD-10-CM | POA: Insufficient documentation

## 2013-10-21 DIAGNOSIS — M25559 Pain in unspecified hip: Secondary | ICD-10-CM | POA: Diagnosis not present

## 2013-10-21 DIAGNOSIS — Z5189 Encounter for other specified aftercare: Secondary | ICD-10-CM | POA: Insufficient documentation

## 2013-10-21 DIAGNOSIS — J45909 Unspecified asthma, uncomplicated: Secondary | ICD-10-CM | POA: Insufficient documentation

## 2013-10-21 HISTORY — DX: Occlusion and stenosis of unspecified carotid artery: I65.29

## 2013-10-21 HISTORY — DX: Hyperlipidemia, unspecified: E78.5

## 2013-10-21 NOTE — Progress Notes (Addendum)
Ms. Gilberti arrived via wheelchair for orientation to Pulmonary Rehab.  Neat, well dressed, appearing a little nervous, very talkative.  Alert , oriented.  She is complaining of right hip pain which she says she has been having for a long time.  Has had 2 previous injections for bursitis which did help at that time.  She rates today's pain at 8/10.  She is oriented to the program guidelines and expectations.  Goals for rehab defined and obtainable.  Health history and medications reviewed with patient.   She does state that she has previously had a stroke and a mini stroke.  Grips are strong and equal.  She does have a mild tremor in her right and left hand and right foot. She says sometimes she is a little wobbly when walking.   Also  she sometimes will just burst out in tears and may be laughing at the same time and she attributes this to the previous stroke. No peripheral edema, distal pulses +3 on left and +2 on right.  Breath sounds clear.  She states she is followed by Dr. Kellie Simmering for blockage in left carotid artery, and she has had stent in right carotid artery.  Demonstration and practice of PLB using pulse oximeter.  Patient able to return demonstration satisfactorily. Safety and hand hygiene in the exercise area reviewed with patient.  Patient voices understanding. We discussed her present status with right hip pain and she is advised to call Dr. Maureen Ralphs.s office for evaluation prior to walk test and exercise.   She is in agreement and will call today and let Pulmonary Rehab Staff know when she will be ready to start exercise.  We look forward to assisting this nice lady with improvement in her QOL.   10:55-12:05 Leverne Humbles RN

## 2013-10-28 DIAGNOSIS — Z96659 Presence of unspecified artificial knee joint: Secondary | ICD-10-CM | POA: Diagnosis not present

## 2013-10-28 DIAGNOSIS — M76899 Other specified enthesopathies of unspecified lower limb, excluding foot: Secondary | ICD-10-CM | POA: Diagnosis not present

## 2013-10-28 DIAGNOSIS — Z471 Aftercare following joint replacement surgery: Secondary | ICD-10-CM | POA: Diagnosis not present

## 2013-11-03 ENCOUNTER — Encounter (HOSPITAL_COMMUNITY)
Admission: RE | Admit: 2013-11-03 | Discharge: 2013-11-03 | Disposition: A | Discharge status: Home / Self Care | Payer: Medicare Other | Source: Ambulatory Visit | Attending: Pulmonary Disease | Admitting: Pulmonary Disease

## 2013-11-03 ENCOUNTER — Encounter (HOSPITAL_COMMUNITY): Payer: Self-pay

## 2013-11-03 DIAGNOSIS — Z5189 Encounter for other specified aftercare: Secondary | ICD-10-CM | POA: Diagnosis not present

## 2013-11-03 DIAGNOSIS — Z8673 Personal history of transient ischemic attack (TIA), and cerebral infarction without residual deficits: Secondary | ICD-10-CM | POA: Insufficient documentation

## 2013-11-03 DIAGNOSIS — J45909 Unspecified asthma, uncomplicated: Secondary | ICD-10-CM | POA: Insufficient documentation

## 2013-11-03 DIAGNOSIS — M25559 Pain in unspecified hip: Secondary | ICD-10-CM | POA: Diagnosis not present

## 2013-11-03 NOTE — Progress Notes (Signed)
[name] completed a Six-Minute Walk Test on 11/03/13 . [name] walked 723 feet with 0 breaks.  The patient's lowest oxygen saturation was 94% , highest heart rate was 93 , and highest blood pressure was 138/60. The patient was on 0 liters. [name] stated that overall fatigue hindered their walk test.

## 2013-11-05 ENCOUNTER — Encounter (HOSPITAL_COMMUNITY)
Admission: RE | Admit: 2013-11-05 | Discharge: 2013-11-05 | Disposition: A | Discharge status: Home / Self Care | Payer: Medicare Other | Source: Ambulatory Visit | Attending: Pulmonary Disease | Admitting: Pulmonary Disease

## 2013-11-05 ENCOUNTER — Encounter (HOSPITAL_COMMUNITY): Payer: Self-pay

## 2013-11-05 NOTE — Progress Notes (Signed)
Today, [name] exercised at [facility]. Service time was from 1:30 to 3:30.  The patient exercised for more than 31 minutes performing aerobic, strengthening, and stretching exercises. Oxygen saturation, heart rate, blood pressure, rate of perceived exertion, and shortness of breath were all monitored before, during, and after exercise. [name] presented with no problems at today's exercise session. The patient attended "Advanced Directives" today with [name].  There was no workload change during today's exercise session.  Pre-exercise vitals:   Weight kg: 84.5   Liters of O2: 0   SpO2: 99   HR: 78   BP: 130/62   CBG: na  Exercise vitals:   Highest heartrate:  87   Lowest oxygen saturation: 95   Highest blood pressure: 126/64   Liters of 02: 0  Post-exercise vitals:   SpO2: 99   HR: 70   BP: 133/63   Liters of O2: 0   CBG: na  Dr. Brand Males, Medical Director Dr. Dyann Kief is immediately available during today's Pulmonary Rehab session for [facility] on 11/05/13 at 1:30 class time.

## 2013-11-10 ENCOUNTER — Encounter (HOSPITAL_COMMUNITY): Payer: Self-pay | Admitting: *Deleted

## 2013-11-10 ENCOUNTER — Encounter (HOSPITAL_COMMUNITY)
Admission: RE | Admit: 2013-11-10 | Discharge: 2013-11-10 | Disposition: A | Discharge status: Home / Self Care | Payer: Medicare Other | Source: Ambulatory Visit | Attending: Pulmonary Disease | Admitting: Pulmonary Disease

## 2013-11-10 NOTE — Progress Notes (Signed)
Today, [name] exercised at [facility]. Service time was from 1330 to 1530.  The patient exercised for more than 31 minutes performing aerobic, strengthening, and stretching exercises. Oxygen saturation, heart rate, blood pressure, rate of perceived exertion, and shortness of breath were all monitored before, during, and after exercise. [name] presented with no problems at today's exercise session.   There was no workload change during today's exercise session.  Pre-exercise vitals:   Weight kg: 84.2   Liters of O2: RA   SpO2: 97   HR: 80   BP: 130/62   CBG: NA  Exercise vitals:   Highest heartrate:  80   Lowest oxygen saturation: 96   Highest blood pressure: 130/70   Liters of 02: RA  Post-exercise vitals:   SpO2: 96   HR: 72   BP: 114/60   Liters of O2: RA   CBG: NA Dr. Brand Males, Medical Director Dr. Dyann Kief is immediately available during today's Pulmonary Rehab session for [facility] on 11/10/2013 at 1:30 pm class time.

## 2013-11-11 ENCOUNTER — Encounter: Payer: Self-pay | Admitting: Cardiology

## 2013-11-11 ENCOUNTER — Encounter: Payer: Self-pay | Admitting: *Deleted

## 2013-11-11 ENCOUNTER — Ambulatory Visit (INDEPENDENT_AMBULATORY_CARE_PROVIDER_SITE_OTHER): Payer: Medicare Other | Admitting: Cardiology

## 2013-11-11 VITALS — BP 140/55 | HR 77 | Ht 65.0 in | Wt 185.0 lb

## 2013-11-11 DIAGNOSIS — R0600 Dyspnea, unspecified: Secondary | ICD-10-CM

## 2013-11-11 DIAGNOSIS — I6529 Occlusion and stenosis of unspecified carotid artery: Secondary | ICD-10-CM | POA: Diagnosis not present

## 2013-11-11 DIAGNOSIS — I10 Essential (primary) hypertension: Secondary | ICD-10-CM

## 2013-11-11 DIAGNOSIS — I38 Endocarditis, valve unspecified: Secondary | ICD-10-CM | POA: Diagnosis not present

## 2013-11-11 DIAGNOSIS — R0609 Other forms of dyspnea: Secondary | ICD-10-CM | POA: Diagnosis not present

## 2013-11-11 DIAGNOSIS — E785 Hyperlipidemia, unspecified: Secondary | ICD-10-CM

## 2013-11-11 DIAGNOSIS — R0989 Other specified symptoms and signs involving the circulatory and respiratory systems: Secondary | ICD-10-CM | POA: Diagnosis not present

## 2013-11-11 MED ORDER — SIMVASTATIN 10 MG PO TABS: 10.0000 mg | ORAL_TABLET | Freq: Every day | ORAL | Status: DC

## 2013-11-11 NOTE — Patient Instructions (Signed)
Your physician has requested that you have an echocardiogram. Echocardiography is a painless test that uses sound waves to create images of your heart. It provides your doctor with information about the size and shape of your heart and how well your heart's chambers and valves are working. This procedure takes approximately one hour. There are no restrictions for this procedure.  Your physician wants you to follow-up in: 1 year with Dr Aundra Dubin. (April 2016).  You will receive a reminder letter in the mail two months in advance. If you don't receive a letter, please call our office to schedule the follow-up appointment.

## 2013-11-12 ENCOUNTER — Encounter (HOSPITAL_COMMUNITY)
Admission: RE | Admit: 2013-11-12 | Discharge: 2013-11-12 | Disposition: A | Discharge status: Home / Self Care | Payer: Medicare Other | Source: Ambulatory Visit | Attending: Pulmonary Disease | Admitting: Pulmonary Disease

## 2013-11-12 ENCOUNTER — Encounter (HOSPITAL_COMMUNITY): Payer: Self-pay

## 2013-11-12 NOTE — Progress Notes (Signed)
Today, [name] exercised at [facility]. Service time was from 1:30 to 3:30.  The patient exercised for more than 31 minutes performing aerobic, strengthening, and stretching exercises. Oxygen saturation, heart rate, blood pressure, rate of perceived exertion, and shortness of breath were all monitored before, during, and after exercise. [name] presented with no problems at today's exercise session.   There was no workload change during today's exercise session.  Pre-exercise vitals:   Weight kg: 83.8   Liters of O2: ra   SpO2: 99   HR: 84   BP: 120/56   CBG: na  Exercise vitals:   Highest heartrate:  81   Lowest oxygen saturation: 98   Highest blood pressure: 146/70   Liters of 02: ra  Post-exercise vitals:   SpO2: 98   HR: 78   BP: 138/80   Liters of O2: ra   CBG: na  Dr. Brand Males, Medical Director Dr. Aileen Fass is immediately available during today's Pulmonary Rehab session for [facility] on 11/12/13 at 1:30pm class time.

## 2013-11-13 DIAGNOSIS — I38 Endocarditis, valve unspecified: Secondary | ICD-10-CM | POA: Insufficient documentation

## 2013-11-13 NOTE — Addendum Note (Signed)
Addended by: Katrine Coho on: 11/13/2013 08:44 AM   Modules accepted: Orders

## 2013-11-13 NOTE — Progress Notes (Signed)
Patient ID: [name], female   DOB: 02/25/1935, 79 y.o.   MRN: [id number] PCP: Dr. Lenna Gilford  79 yo with long history of asthma with flares as well as carotid stenosis s/p CEA presents for cardiology followup.  She had a Lexiscan Cardiolite in 4/14 with no ischemia or infarction.  She had an echo in 6/14 with EF 55-60%, very mild AS, moderate AI, mild MR, and moderate TR.  She has been doing pulmonary rehab for her chronic asthma.  She has baseline exertional dyspnea after walking 50-100 feet.  This has been present for a long time now without change.  No orthopnea or PND.  No chest pain.    ECG: NSR, normal  Labs (3/13): LDL 103, HDL 91 Labs (4/13): K 4.6, creatinine 1.29 Labs (10/14): K 4.4, creatinine 1.3, LDL 66, HDL 76  PMH: 1. Carotid stenosis: Amaurosis fugax in 2010, then right CEA in 6/10 (follows with Kellie Simmering).  2. Hypothyroidism 3. GERD 4. H/o diverticulitis with sigmoid colectomy in 2003.   5. OA with right TKR in 2008.  6. Asthma with flares 7. HTN 8. Dyspnea: Negative stress test in 1995. Lexiscan Cardiolite (4/14) with no ischemia or infarction.  Echo (6/14) with EF 55-60%, mild LVH, very mild AS, moderate AI, mild MR, moderate TR.  9. Depression  SH: Married, never smoked, retired from [employer]  FH: Father with CABG in his 12s, mother with CAD.    ROS: All systems reviewed and negative except as per HPI.   Current Outpatient Prescriptions  Medication Sig Dispense Refill  . aspirin EC 325 MG tablet Take 1 tablet (325 mg total) by mouth daily.  30 tablet  0  . Cholecalciferol (VITAMIN D) 2000 UNITS CAPS Take 1 capsule by mouth daily.      . Cranberry-Vitamin C-Probiotic (AZO CRANBERRY) 250-30 MG TABS Take 1 capsule by mouth daily.      . ferrous sulfate 325 (65 FE) MG tablet Take 325 mg by mouth daily with breakfast.      . fish oil-omega-3 fatty acids 1000 MG capsule Take 2 g by mouth daily.      . Fluticasone-Salmeterol (ADVAIR DISKUS) 250-50 MCG/DOSE AEPB Inhale 1  puff into the lungs 2 (two) times daily.  3 each  3  . levothyroxine (SYNTHROID, LEVOTHROID) 100 MCG tablet TAKE 1 TABLET (100 MCG TOTAL) BY MOUTH DAILY BEFORE BREAKFAST.  90 tablet  3  . LORazepam (ATIVAN) 0.5 MG tablet Take 1/2 to 1 tablet by mouth two times daily  60 tablet  5  . losartan (COZAAR) 100 MG tablet Take 1 tablet (100 mg total) by mouth daily.  90 tablet  1  . Multiple Vitamins-Minerals (WOMENS MULTIVITAMIN PLUS PO) Take 1 tablet by mouth daily.      . naproxen sodium (ANAPROX) 220 MG tablet Take 220 mg by mouth 2 (two) times daily with a meal.      . omeprazole (PRILOSEC) 20 MG capsule TAKE 1 CAPSULE (20 MG TOTAL) BY MOUTH DAILY.  90 capsule  3  . Nutritional Supplements (OSTEO ADVANCE) TABS Take 1 tablet by mouth daily.      . simvastatin (ZOCOR) 10 MG tablet Take 1 tablet (10 mg total) by mouth at bedtime.  90 tablet  3   No current facility-administered medications for this visit.   BP 140/55  Pulse 77  Ht 5\' 5"  (1.651 m)  Wt 83.915 kg (185 lb)  BMI 30.79 kg/m2  SpO2 99% General: NAD, overweight Neck: No JVD, no thyromegaly  or thyroid nodule.  Lungs: Clear but somewhat prolonged expiratory phase.  CV: Nondisplaced PMI.  Heart regular S1/S2, no S3/S4, no murmur.  No peripheral edema.  No carotid bruit.  Normal pedal pulses.  Abdomen: Soft, nontender, no hepatosplenomegaly, no distention.  Skin: Intact without lesions or rashes.  Neurologic: Alert and oriented x 3.  Psych: Normal affect. Extremities: No clubbing or cyanosis.   Assessment/Plan: 1. Exertional dyspnea: This has been chronic but stable.  She does not appear volume overloaded on exam.  Lexiscan Cardiolite showed no ischemia/infarction in 4/14.  I suspect the dyspnea is mainly due to deconditioning and possibly asthma.   2. Hyperlipidemia: She has known vascular disease (carotid stenosis).  I will refill her simvastatin today.  Good lipids in 10/14.   3. HTN: BP ok.  4. Carotid stenosis s/p CEA: Follows  with Dr. Kellie Simmering, needs appointment.  5. Valvular heart disease: Moderate AI, mild AS, moderate TR on prior echo.  Given chronic dyspnea, I will repeat echo to make sure that valve disease is not progressing.   Larey Dresser 11/13/2013

## 2013-11-17 ENCOUNTER — Encounter (HOSPITAL_COMMUNITY)
Admission: RE | Admit: 2013-11-17 | Discharge: 2013-11-17 | Disposition: A | Discharge status: Home / Self Care | Payer: Medicare Other | Source: Ambulatory Visit | Attending: Pulmonary Disease | Admitting: Pulmonary Disease

## 2013-11-17 NOTE — Progress Notes (Signed)
[name] 79 y.o. female Nutrition Note Spoke with pt. Pt is overweight. Pt usually eats 2 meals (breakfast and dinner) and snacks in between her meals. Most meals are prepared at home. Pt is making healthy food choices the majority of the time. Pt has some room for improvement. Pt's Rate Your Plate results reviewed with pt. Pt reports she has been getting too tired to prepare meals at home, which then she would usually eat out or eat ready-to-eat prepared foods, such as canned tuna. Pt was educated that eating out/ready-to-eat foods are fine as long as healthy choices are considered and watch for the fat and salt contents. Pt also reports she gets full easily, thus does not eat a lot during meals. Pt was educated on trying to eat 6 small meals a day of foods that are nutrient dense and high in protein. Pt expresses that she usually drinks diet pepsi throughout the day. Pt was encouraged to drink more water and try to limit her diet soda intake. Pt expressed understanding.     Nutrition Diagnosis   Food-and nutrition-related knowledge deficit related to lack of exposure to information as related to diagnosis of pulmonary disease   Overweight related to excessive energy intake as evidenced by a BMI of 29.  Nutrition Rx/Est. Daily Nutrition Needs for: ? wt loss  1700-2000 Kcal, 80-95 gm protein, 1500-2000 mg or less sodium      Nutrition Intervention   Pt's individual nutrition plan and goals reviewed with pt.   Benefits of adopting healthy eating habits discussed when pt's Rate Your Plate reviewed.   Pt to attend the Nutrition and Lung Disease class   Continual client-centered nutrition education by RD, as part of interdisciplinary care. Goal(s) 1. The pt will consume high-energy, high-nutrient dense beverages when necessary to compensate for decreased oral intake of solid foods. 2. Identify food quantities necessary to achieve wt loss of  -2# per week to a goal wt of 73-81 kg (160-178 lb) at  graduation from pulmonary rehab. 3. Describe the benefit of including fruits, vegetables, whole grains, and low-fat dairy products in a healthy meal plan.  Monitor and Evaluate progress toward nutrition goal with team.   [place] Intern   11/17/2013 2:41 PM  Derek Mound, M.Ed, RD, LDN, CDE 11/19/2013 2:21 PM

## 2013-11-17 NOTE — Progress Notes (Signed)
Today, [name] exercised at [employer] [facility]. Service time was from 1330 to 1515.  The patient exercised for more than 31 minutes performing aerobic, strengthening, and stretching exercises. Oxygen saturation, heart rate, blood pressure, rate of perceived exertion, and shortness of breath were all monitored before, during, and after exercise. [name] presented with no problems at today's exercise session.   There was one workload change during today's exercise session.  Pre-exercise vitals:   Weight kg: 83.2   Liters of O2: RA   SpO2: 99   HR: 70   BP: 128/60   CBG: NA  Exercise vitals:   Highest heartrate:  92   Lowest oxygen saturation: 94   Highest blood pressure: 128/60   Liters of 02: RA  Post-exercise vitals:   SpO2: 98   HR: 69   BP: 114/64   Liters of O2: RA   CBG: NA Dr. Brand Males, Medical Director Dr. Aileen Fass is immediately available during today's Pulmonary Rehab session for [facility] on 11/17/2013 at 1:30 pm class time.

## 2013-11-18 ENCOUNTER — Encounter: Payer: Self-pay | Admitting: Internal Medicine

## 2013-11-18 ENCOUNTER — Ambulatory Visit (INDEPENDENT_AMBULATORY_CARE_PROVIDER_SITE_OTHER): Payer: Medicare Other | Admitting: Internal Medicine

## 2013-11-18 ENCOUNTER — Other Ambulatory Visit: Payer: Self-pay | Admitting: Internal Medicine

## 2013-11-18 ENCOUNTER — Other Ambulatory Visit (INDEPENDENT_AMBULATORY_CARE_PROVIDER_SITE_OTHER): Payer: Medicare Other

## 2013-11-18 VITALS — BP 126/58 | HR 72 | Temp 96.7°F | Resp 14 | Wt 184.5 lb

## 2013-11-18 DIAGNOSIS — I10 Essential (primary) hypertension: Secondary | ICD-10-CM

## 2013-11-18 DIAGNOSIS — I6529 Occlusion and stenosis of unspecified carotid artery: Secondary | ICD-10-CM | POA: Diagnosis not present

## 2013-11-18 DIAGNOSIS — D649 Anemia, unspecified: Secondary | ICD-10-CM | POA: Diagnosis not present

## 2013-11-18 DIAGNOSIS — F329 Major depressive disorder, single episode, unspecified: Secondary | ICD-10-CM | POA: Diagnosis not present

## 2013-11-18 DIAGNOSIS — J45901 Unspecified asthma with (acute) exacerbation: Secondary | ICD-10-CM | POA: Diagnosis not present

## 2013-11-18 DIAGNOSIS — F3289 Other specified depressive episodes: Secondary | ICD-10-CM

## 2013-11-18 DIAGNOSIS — E039 Hypothyroidism, unspecified: Secondary | ICD-10-CM

## 2013-11-18 DIAGNOSIS — I350 Nonrheumatic aortic (valve) stenosis: Secondary | ICD-10-CM

## 2013-11-18 DIAGNOSIS — Z79899 Other long term (current) drug therapy: Secondary | ICD-10-CM | POA: Diagnosis not present

## 2013-11-18 DIAGNOSIS — D539 Nutritional anemia, unspecified: Secondary | ICD-10-CM | POA: Insufficient documentation

## 2013-11-18 DIAGNOSIS — Z0000 Encounter for general adult medical examination without abnormal findings: Secondary | ICD-10-CM | POA: Insufficient documentation

## 2013-11-18 DIAGNOSIS — Z23 Encounter for immunization: Secondary | ICD-10-CM

## 2013-11-18 HISTORY — DX: Nonrheumatic aortic (valve) stenosis: I35.0

## 2013-11-18 LAB — HEPATIC FUNCTION PANEL
ALK PHOS: 66 U/L (ref 39–117)
ALT: 10 U/L (ref 0–35)
AST: 13 U/L (ref 0–37)
Albumin: 3.8 g/dL (ref 3.5–5.2)
Bilirubin, Direct: 0.1 mg/dL (ref 0.0–0.3)
TOTAL PROTEIN: 7.5 g/dL (ref 6.0–8.3)
Total Bilirubin: 0.8 mg/dL (ref 0.3–1.2)

## 2013-11-18 LAB — URINALYSIS, ROUTINE W REFLEX MICROSCOPIC
BILIRUBIN URINE: NEGATIVE
HGB URINE DIPSTICK: NEGATIVE
KETONES UR: NEGATIVE
Nitrite: POSITIVE — AB
PH: 5.5 (ref 5.0–8.0)
RBC / HPF: NONE SEEN (ref 0–?)
Specific Gravity, Urine: 1.025 (ref 1.000–1.030)
TOTAL PROTEIN, URINE-UPE24: NEGATIVE
URINE GLUCOSE: NEGATIVE
Urobilinogen, UA: 0.2 (ref 0.0–1.0)

## 2013-11-18 LAB — CBC WITH DIFFERENTIAL/PLATELET
BASOS ABS: 0.1 10*3/uL (ref 0.0–0.1)
Basophils Relative: 0.9 % (ref 0.0–3.0)
Eosinophils Absolute: 0.3 10*3/uL (ref 0.0–0.7)
Eosinophils Relative: 5.4 % — ABNORMAL HIGH (ref 0.0–5.0)
HEMATOCRIT: 32.3 % — AB (ref 36.0–46.0)
Hemoglobin: 11.1 g/dL — ABNORMAL LOW (ref 12.0–15.0)
LYMPHS ABS: 2.1 10*3/uL (ref 0.7–4.0)
Lymphocytes Relative: 33.6 % (ref 12.0–46.0)
MCHC: 34.5 g/dL (ref 30.0–36.0)
MCV: 96.8 fl (ref 78.0–100.0)
MONO ABS: 0.5 10*3/uL (ref 0.1–1.0)
MONOS PCT: 7.8 % (ref 3.0–12.0)
NEUTROS PCT: 52.3 % (ref 43.0–77.0)
Neutro Abs: 3.2 10*3/uL (ref 1.4–7.7)
Platelets: 271 10*3/uL (ref 150.0–400.0)
RBC: 3.34 Mil/uL — ABNORMAL LOW (ref 3.87–5.11)
RDW: 13.9 % (ref 11.5–14.6)
WBC: 6.2 10*3/uL (ref 4.5–10.5)

## 2013-11-18 LAB — BASIC METABOLIC PANEL
BUN: 35 mg/dL — AB (ref 6–23)
CHLORIDE: 109 meq/L (ref 96–112)
CO2: 23 meq/L (ref 19–32)
Calcium: 9.8 mg/dL (ref 8.4–10.5)
Creatinine, Ser: 1.3 mg/dL — ABNORMAL HIGH (ref 0.4–1.2)
GFR: 40.84 mL/min — AB (ref >60.00)
Glucose, Bld: 86 mg/dL (ref 70–99)
POTASSIUM: 4.5 meq/L (ref 3.5–5.1)
SODIUM: 141 meq/L (ref 135–145)

## 2013-11-18 LAB — LIPID PANEL
CHOLESTEROL: 193 mg/dL (ref 0–200)
HDL: 77.5 mg/dL (ref >39.00)
LDL Cholesterol: 103 mg/dL — ABNORMAL HIGH (ref 0–99)
Total CHOL/HDL Ratio: 2
Triglycerides: 64 mg/dL (ref 0.0–149.0)
VLDL: 12.8 mg/dL (ref 0.0–40.0)

## 2013-11-18 LAB — IBC PANEL
IRON: 163 ug/dL — AB (ref 42–145)
SATURATION RATIOS: 52 % — AB (ref 20.0–50.0)
Transferrin: 223.9 mg/dL (ref 212.0–360.0)

## 2013-11-18 LAB — TSH: TSH: 0.19 u[IU]/mL — ABNORMAL LOW (ref 0.35–5.50)

## 2013-11-18 MED ORDER — LEVOTHYROXINE SODIUM 88 MCG PO TABS: 88.0000 ug | ORAL_TABLET | Freq: Every day | ORAL | Status: DC

## 2013-11-18 MED ORDER — PNEUMOCOCCAL 13-VAL CONJ VACC IM SUSP: 0.5000 mL | Freq: Once | INTRAMUSCULAR | Status: DC

## 2013-11-18 MED ORDER — ESCITALOPRAM OXALATE 10 MG PO TABS: 10.0000 mg | ORAL_TABLET | Freq: Every day | ORAL | Status: DC

## 2013-11-18 NOTE — Patient Instructions (Addendum)
You had ythe new Prevnar pneumonia shot today  Please call for your yearly mammogram  Please take all new medication as prescribed - the lexapro for low mood  Please continue all other medications as before, and refills have been done if requested. Please have the pharmacy call with any other refills you may need.  Please continue your efforts at being more active, low cholesterol diet, and weight control. You are otherwise up to date with prevention measures today.  Please keep your appointments with your specialists as you have planned - Dr Lenna Gilford for asthma  Please go to the LAB in the Basement (turn left off the elevator) for the tests to be done today  You will be contacted by phone if any changes need to be made immediately.  Otherwise, you will receive a letter about your results with an explanation, but please check with MyChart first.  Please remember to sign up for MyChart if you have not done so, as this will be important to you in the future with finding out test results, communicating by private email, and scheduling acute appointments online when needed.  Please return in 6 months, or sooner if needed

## 2013-11-18 NOTE — Progress Notes (Signed)
Subjective:    Patient ID: Zenon Mayo, female    DOB: 1934-08-26, 79 y.o.   MRN: [id number]  HPI  Here to transfer primary care from Dr Lenna Gilford, who plans to still see her for asthma.  Today states lack of energy and general weakness for last several days but hx somewhat vague. No fever, cough, or overtly increased sob/wheezing.  Has had some incr WOB mild with exertion.  Did see pulm rehab last wk, reports o2 sat at that time 95-99% even with exertion.  Today is 85% after walk in from parking lot, adn some minutes of rest.  Pt denies chest pain, orthopnea, PND, increased LE swelling, palpitations, dizziness or syncope.  Has had mild worsening depressive symptoms in the past few weeks with tearfulness,but no suicidal ideation, or panic; has ongoing anxiety, not increased recently., has benzo in past started per neurology after her stroke.  Past Medical History  Diagnosis Date  . History of sudden visual loss   . Acute cystitis   . Bronchitis, acute   . Hypertension   . Cerebrovascular disease   . Hypothyroidism   . GERD (gastroesophageal reflux disease)   . Diverticulosis of colon   . DJD (degenerative joint disease)   . Back pain   . Transient ischemic attack   . Anxiety   . Depression   . Asthma   . COPD (chronic obstructive pulmonary disease)   . Shortness of breath     with exertion   . Pneumonia     hx of x 2   . 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 UTI (lower urinary tract infection)     hx of   . Carotid artery occlusion   . Hyperlipidemia    Past Surgical History  Procedure Laterality Date  . Vaginal hysterectomy    . Sigmoid colectomy    . Tubal ligation    . Replacement total knee      right and left knee  . Cardiothoracic procedure  01/13/2009    Right   . Right foot surgery       nerve cut between toes   . Endartarectomy      right carotid endartarectomy - 2010  . Total knee arthroplasty Left 11/17/2012    Procedure: TOTAL KNEE ARTHROPLASTY;   Surgeon: Gearlean Alf, MD;  Location: [facility];  Service: Orthopedics;  Laterality: Left;    reports that she has never smoked. She has never used smokeless tobacco. She reports that she does not drink alcohol or use illicit drugs. family history includes Coronary artery disease in her sister; Emphysema in her father; Heart attack in her brother; Heart disease in her mother and sister; Rheumatic fever in her brother. Allergies  Allergen Reactions  . Relafen [Nabumetone] Diarrhea and Other (See Comments)    GI / Urinary Bleeding  . Lipitor [Atorvastatin]     Causes memory loss  . Penicillins Hives, Itching, Swelling and Rash   Current Outpatient Prescriptions on File Prior to Visit  Medication Sig Dispense Refill  . aspirin EC 325 MG tablet Take 1 tablet (325 mg total) by mouth daily.  30 tablet  0  . Cholecalciferol (VITAMIN D) 2000 UNITS CAPS Take 1 capsule by mouth daily.      . Cranberry-Vitamin C-Probiotic (AZO CRANBERRY) 250-30 MG TABS Take 1 capsule by mouth daily.      . fish oil-omega-3 fatty acids 1000 MG capsule Take 2 g by mouth  daily.      . Fluticasone-Salmeterol (ADVAIR DISKUS) 250-50 MCG/DOSE AEPB Inhale 1 puff into the lungs 2 (two) times daily.  3 each  3  . levothyroxine (SYNTHROID, LEVOTHROID) 100 MCG tablet TAKE 1 TABLET (100 MCG TOTAL) BY MOUTH DAILY BEFORE BREAKFAST.  90 tablet  3  . losartan (COZAAR) 100 MG tablet Take 1 tablet (100 mg total) by mouth daily.  90 tablet  1  . Multiple Vitamins-Minerals (WOMENS MULTIVITAMIN PLUS PO) Take 1 tablet by mouth daily.      . naproxen sodium (ANAPROX) 220 MG tablet Take 220 mg by mouth 2 (two) times daily with a meal.      . Nutritional Supplements (OSTEO ADVANCE) TABS Take 1 tablet by mouth daily.      Marland Kitchen omeprazole (PRILOSEC) 20 MG capsule TAKE 1 CAPSULE (20 MG TOTAL) BY MOUTH DAILY.  90 capsule  3  . simvastatin (ZOCOR) 10 MG tablet Take 1 tablet (10 mg total) by mouth at bedtime.  90 tablet  3  . ferrous sulfate 325 (65  FE) MG tablet Take 325 mg by mouth daily with breakfast.      . LORazepam (ATIVAN) 0.5 MG tablet Take 1/2 to 1 tablet by mouth two times daily  60 tablet  5   No current facility-administered medications on file prior to visit.    Review of Systems  Constitutional: Negative for unexpected weight change, or unusual diaphoresis  HENT: Negative for tinnitus.   Eyes: Negative for photophobia and visual disturbance.  Respiratory: Negative for choking and stridor.   Gastrointestinal: Negative for vomiting and blood in stool.  Genitourinary: Negative for hematuria and decreased urine volume.  Musculoskeletal: Negative for acute joint swelling Skin: Negative for color change and wound.  Neurological: Negative for tremors and numbness other than noted  Psychiatric/Behavioral: Negative for decreased concentration or  hyperactivity.       Objective:   Physical Exam BP 126/58  Pulse 72  Temp(Src) 96.7 F (35.9 C) (Oral)  Resp 14  Wt 184 lb 8 oz (83.689 kg)  SpO2 85% Repeat o2 sat at rest - 95%. amb sat 92%at 100 ft VS noted, not ill but weak appearing Constitutional: Pt appears well-developed and well-nourished.  HENT: Head: NCAT.  Right Ear: External ear normal.  Left Ear: External ear normal.  Eyes: Conjunctivae and EOM are normal. Pupils are equal, round, and reactive to light.  Neck: Normal range of motion. Neck supple.  Cardiovascular: Normal rate and regular rhythm.   Pulmonary/Chest: Effort normal and breath sounds normal.  Abd:  Soft, NT, non-distended, + BS Neurological: Pt is alert. Not confused , o/w not done in detail Skin: Skin is warm. No erythema. No LE edema Psychiatric: Pt behavior is normal.      Assessment & Plan:

## 2013-11-18 NOTE — Addendum Note (Signed)
Addended by: Johnsie Cancel on: 11/18/2013 11:17 AM   Modules accepted: Orders

## 2013-11-18 NOTE — Assessment & Plan Note (Signed)
stable overall by history and exam, recent data reviewed with pt, and pt to continue medical treatment as before,  to f/u any worsening symptoms or concerns BP Readings from Last 3 Encounters:  11/18/13 126/58  11/11/13 140/55  10/21/13 110/52    Note:  Total time for pt hx, exam, review of record with pt in the room, determination of diagnoses and plan for further eval and tx is > 40 min, with over 50% spent in coordination and counseling of patient

## 2013-11-18 NOTE — Assessment & Plan Note (Signed)
stable overall by history and exam, recent data reviewed with pt, and pt to continue medical treatment as before,  to f/u any worsening symptoms or concerns Lab Results  Component Value Date   TSH 0.38 05/18/2013

## 2013-11-18 NOTE — Progress Notes (Signed)
Pre visit review using our clinic review tool, if applicable. No additional management support is needed unless otherwise documented below in the visit note. 

## 2013-11-18 NOTE — Assessment & Plan Note (Signed)
To start lexapro 10  qd 

## 2013-11-18 NOTE — Assessment & Plan Note (Signed)
For f/u lab, check iron

## 2013-11-19 ENCOUNTER — Encounter (HOSPITAL_COMMUNITY): Payer: Medicare Other

## 2013-11-24 ENCOUNTER — Encounter (HOSPITAL_COMMUNITY)
Admission: RE | Admit: 2013-11-24 | Discharge: 2013-11-24 | Disposition: A | Discharge status: Home / Self Care | Payer: Medicare Other | Source: Ambulatory Visit | Attending: Pulmonary Disease | Admitting: Pulmonary Disease

## 2013-11-24 NOTE — Progress Notes (Signed)
Today, Idalia exercised at [employer] [facility]. Service time was from 1330 to 1515.  The patient exercised for more than 31 minutes performing aerobic, strengthening, and stretching exercises. Oxygen saturation, heart rate, blood pressure, rate of perceived exertion, and shortness of breath were all monitored before, during, and after exercise. Yaretzi presented with no problems at today's exercise session, but did complain of feeling tired.   There was a workload change during today's exercise session.  Pre-exercise vitals:   Weight kg: 83.5   Liters of O2: RA   SpO2: 95   HR: 73   BP: 112/60   CBG: NA  Exercise vitals:   Highest heartrate:  81   Lowest oxygen saturation: 93   Highest blood pressure: 120/52   Liters of 02: RA  Post-exercise vitals:   SpO2: 96   HR: 67   BP: 124/56   Liters of O2: RA   CBG: NA Dr. Brand Males, Medical Director Dr. Sheran Fava is immediately available during today's Pulmonary Rehab session for [facility] on 11/24/2013 at 1330 class time.

## 2013-11-26 ENCOUNTER — Encounter (HOSPITAL_COMMUNITY)
Admission: RE | Admit: 2013-11-26 | Discharge: 2013-11-26 | Disposition: A | Discharge status: Home / Self Care | Payer: Medicare Other | Source: Ambulatory Visit | Attending: Pulmonary Disease | Admitting: Pulmonary Disease

## 2013-11-26 NOTE — Progress Notes (Signed)
Today, [name] exercised at [employer]. Service time was from 1:30pm to 3:30pm.  The patient exercised for more than 31 minutes performing aerobic, strengthening, and stretching exercises. Oxygen saturation, heart rate, blood pressure, rate of perceived exertion, and shortness of breath were all monitored before, during, and after exercise. [name] presented with no problems at today's exercise session. The patient attended "Exercise for the Pulmonary Patient" education class today during rehab.  There was no workload change during today's exercise session.  Pre-exercise vitals:   Weight kg: 83.2   Liters of O2: 0   SpO2: 94   HR: 72   BP: 98/80   CBG: na  Exercise vitals:   Highest heartrate:  73   Lowest oxygen saturation: 97   Highest blood pressure: 140/60   Liters of 02: 0  Post-exercise vitals:   SpO2: 95   HR: 63   BP: 108/58   Liters of O2: 0   CBG: na  Dr. Brand Males, Medical Director Dr. Aileen Fass is immediately available during today's Pulmonary Rehab session for [facility] on 11/26/13 at 1:30pm class time.

## 2013-12-01 ENCOUNTER — Encounter (HOSPITAL_COMMUNITY)
Admission: RE | Admit: 2013-12-01 | Discharge: 2013-12-01 | Disposition: A | Discharge status: Home / Self Care | Payer: Medicare Other | Source: Ambulatory Visit | Attending: Pulmonary Disease | Admitting: Pulmonary Disease

## 2013-12-01 DIAGNOSIS — Z5189 Encounter for other specified aftercare: Secondary | ICD-10-CM | POA: Diagnosis not present

## 2013-12-01 DIAGNOSIS — J45909 Unspecified asthma, uncomplicated: Secondary | ICD-10-CM | POA: Insufficient documentation

## 2013-12-01 DIAGNOSIS — M25559 Pain in unspecified hip: Secondary | ICD-10-CM | POA: Diagnosis not present

## 2013-12-01 DIAGNOSIS — Z8673 Personal history of transient ischemic attack (TIA), and cerebral infarction without residual deficits: Secondary | ICD-10-CM | POA: Insufficient documentation

## 2013-12-01 NOTE — Progress Notes (Signed)
Today, [name] exercised at [employer] [facility]. Service time was from 1:30 to 3:15.  The patient exercised for more than 31 minutes performing aerobic, strengthening, and stretching exercises. Oxygen saturation, heart rate, blood pressure, rate of perceived exertion, and shortness of breath were all monitored before, during, and after exercise. [name] presented with no problems at today's exercise session.   There was no workload change during today's exercise session.  Pre-exercise vitals:   Weight kg: 84.3   Liters of O2: ra   SpO2: 96   HR: 72   BP: 110/54   CBG: na  Exercise vitals:   Highest heartrate:  91   Lowest oxygen saturation: 91   Highest blood pressure: 126/64   Liters of 02: 0  Post-exercise vitals:   SpO2: 97   HR: 65   BP: 122/66   Liters of O2: ra   CBG: na  Dr. Brand Males, Medical Director Dr. Aileen Fass is immediately available during today's Pulmonary Rehab session for [facility] on 12/01/13 at 1:30 class time.

## 2013-12-03 ENCOUNTER — Encounter (HOSPITAL_COMMUNITY)
Admission: RE | Admit: 2013-12-03 | Discharge: 2013-12-03 | Disposition: A | Discharge status: Home / Self Care | Payer: Medicare Other | Source: Ambulatory Visit | Attending: Pulmonary Disease | Admitting: Pulmonary Disease

## 2013-12-03 NOTE — Progress Notes (Signed)
Today, [name] exercised at [employer]. Service time was from 1:30 to 3:15.  The patient exercised for more than 31 minutes performing aerobic, strengthening, and stretching exercises. Oxygen saturation, heart rate, blood pressure, rate of perceived exertion, and shortness of breath were all monitored before, during, and after exercise. [name] presented with no problems at today's exercise session. The patient attended the education class "MD Lecture Day" with Dr. Chase Caller.  There was no workload change during today's exercise session.  Pre-exercise vitals:   Weight kg: 83.8   Liters of O2: ra   SpO2: 95   HR: 63   BP: 104/66   CBG: na  Exercise vitals:   Highest heartrate:  75   Lowest oxygen saturation: 97   Highest blood pressure: 126/74   Liters of 02: ra  Post-exercise vitals:   SpO2: 96   HR: 70   BP: 108/72   Liters of O2: ra   CBG: na  Dr. Brand Males, Medical Director Dr. Coralyn Pear is immediately available during today's Pulmonary Rehab session for [facility] on 12/03/13 at 1:30pm class time.

## 2013-12-04 ENCOUNTER — Ambulatory Visit (INDEPENDENT_AMBULATORY_CARE_PROVIDER_SITE_OTHER): Payer: Medicare Other | Admitting: Nurse Practitioner

## 2013-12-04 ENCOUNTER — Encounter: Payer: Self-pay | Admitting: Nurse Practitioner

## 2013-12-04 VITALS — BP 134/60 | HR 73 | Ht 65.0 in | Wt 185.0 lb

## 2013-12-04 DIAGNOSIS — R259 Unspecified abnormal involuntary movements: Secondary | ICD-10-CM

## 2013-12-04 DIAGNOSIS — G459 Transient cerebral ischemic attack, unspecified: Secondary | ICD-10-CM | POA: Diagnosis not present

## 2013-12-04 DIAGNOSIS — I6529 Occlusion and stenosis of unspecified carotid artery: Secondary | ICD-10-CM | POA: Diagnosis not present

## 2013-12-04 DIAGNOSIS — R251 Tremor, unspecified: Secondary | ICD-10-CM

## 2013-12-04 NOTE — Patient Instructions (Signed)
Continue aspirin 325 mg for stroke prevention and strict control of hypertension with blood pressure goal below 130/90 and elevated lipids with LDL cholesterol goal below 100 mg percent. Recommend Lipitor if tolerated. Return for followup in 6 months call earlier if necessary.

## 2013-12-04 NOTE — Progress Notes (Signed)
PATIENT: [name] DOB: 10-Dec-1934  REASON FOR VISIT: routine TIA follow up HISTORY FROM: patient  HISTORY OF PRESENT ILLNESS: PRIOR HPI (PS): 31 year Caucasian lady who had a transient episode of sudden onset dysarthria, left facial droop and drooling of saliva from the left corner of mouth, lasting 30 minutes on 01/03/13. She had stopped her aspirin recently for her knee surgery which has since been restarted after the episode. She went to [facility] emergency room and had a CT scan of the head which was unremarkable. She was advised admission and TIA workup t but the patient refused both. She does have a history of stroke 4 years ago when she lost partial vision in the right eye and was found to have symptomatic carotid stenosis for which he underwent carotid endarterectomy. She is followed by Dr. Kellie Simmering and states her last carotid ultrasound done 2 months ago had shown some mild left ICA stenosis but I do not have those results. She was also on Lipitor which she stopped 6 days ago for unclear reasons. She states her blood pressure is under good control. She does not smoke.   UPDATE 02/19/13 (LL): Patient returns to office for follow up, no neurologic complaints or episodes since last TIA on 01/03/13. She has stopped taking Lipitor due to side effect profile and she thinks her memory is worse since starting it. Her MRA neck showed no significant stenosis at either carotid bifurcation. MRI shows chronic microvascular changes. Her husband has been diagnosed with Alzheimer's and she is tearful talking about it. She is no longer taking Xarelto for DVT prophylaxis after knee replacement, has gone back to 81 mg ASA.   UPDATE 08/25/13 (LL): She returns for follow up for TIA. She has had no recurrent TIA symptoms and has been feeling well. Since last visit she has developed a tremor in right hand, which is bothersome when she eats. She notices tremor in her right foot as well. She denies excessive  caffeine use, only 1 cup in the am. She endorses a high level of stress and does not sleep well.   UPDATE 12/04/13 (LL):  Since last visit, she has established care with Dr. Cathlean Cower as her PCP, and she continues to see Dr. Lenna Gilford for asthma.  Dr. Jenny Reichmann started her on Lexapro for depression, she states she feels so much better, it has made a big difference. GDS score today is 4. The Xanax did not help her tremor.  Her synthroid dose was decreased recently but she has not started to take the lower dose yet. She has not had any recurrent TIAs. She is tolerating daily aspirin well without any significant bruising.  REVIEW OF SYSTEMS: Full 14 system review of systems performed and notable only for: ringing in ears, shortness of breath, tremors  ALLERGIES: Allergies  Allergen Reactions  . Relafen [Nabumetone] Diarrhea and Other (See Comments)    GI / Urinary Bleeding  . Lipitor [Atorvastatin]     Causes memory loss  . Penicillins Hives, Itching, Swelling and Rash    HOME MEDICATIONS: Outpatient Prescriptions Prior to Visit  Medication Sig Dispense Refill  . aspirin EC 325 MG tablet Take 1 tablet (325 mg total) by mouth daily.  30 tablet  0  . Cholecalciferol (VITAMIN D) 2000 UNITS CAPS Take 1 capsule by mouth daily.      . Cranberry-Vitamin C-Probiotic (AZO CRANBERRY) 250-30 MG TABS Take 1 capsule by mouth daily.      Marland Kitchen escitalopram (LEXAPRO) 10  MG tablet Take 1 tablet (10 mg total) by mouth daily.  90 tablet  3  . ferrous sulfate 325 (65 FE) MG tablet Take 325 mg by mouth daily with breakfast.      . fish oil-omega-3 fatty acids 1000 MG capsule Take 2 g by mouth daily.      . Fluticasone-Salmeterol (ADVAIR DISKUS) 250-50 MCG/DOSE AEPB Inhale 1 puff into the lungs 2 (two) times daily.  3 each  3  . levothyroxine (SYNTHROID, LEVOTHROID) 88 MCG tablet Take 1 tablet (88 mcg total) by mouth daily.  90 tablet  3  . LORazepam (ATIVAN) 0.5 MG tablet Take 1/2 to 1 tablet by mouth two times daily  60  tablet  5  . losartan (COZAAR) 100 MG tablet Take 1 tablet (100 mg total) by mouth daily.  90 tablet  1  . Multiple Vitamins-Minerals (WOMENS MULTIVITAMIN PLUS PO) Take 1 tablet by mouth daily.      . naproxen sodium (ANAPROX) 220 MG tablet Take 220 mg by mouth 2 (two) times daily with a meal.      . Nutritional Supplements (OSTEO ADVANCE) TABS Take 1 tablet by mouth daily.      Marland Kitchen omeprazole (PRILOSEC) 20 MG capsule TAKE 1 CAPSULE (20 MG TOTAL) BY MOUTH DAILY.  90 capsule  3  . simvastatin (ZOCOR) 10 MG tablet Take 1 tablet (10 mg total) by mouth at bedtime.  90 tablet  3   Facility-Administered Medications Prior to Visit  Medication Dose Route Frequency Provider Last Rate Last Dose  . pneumococcal 13-valent conjugate vaccine (PREVNAR 13) injection 0.5 mL  0.5 mL Intramuscular Once Biagio Borg, MD         PHYSICAL EXAM  Filed Vitals:   12/04/13 1426  BP: 134/60  Pulse: 73  Height: 5\' 5"  (1.651 m)  Weight: 185 lb (83.915 kg)   Body mass index is 30.79 kg/(m^2).  General: well developed, well nourished elderly Caucasian lady, seated, in no evident distress, NEGATIVE GLABELLAR TAP  Head: head normocephalic and atraumatic. Orohparynx benign  Neck: supple with no carotid or supraclavicular bruits  Cardiovascular: regular rate and rhythm, 2/6 systolic murmur  Musculoskeletal: no deformity.  Skin: no rash/petichiae, surgical scar in right neck from CEA and left knee from recent surgery.  Vascular: Normal pulses all extremities   Neurologic Exam  Mental Status: Awake and fully alert. Oriented to place and time. Recent and remote memory intact. Attention span, concentration and fund of knowledge appropriate. Mood and affect appropriate.  Cranial Nerves: Pupils equal, briskly reactive to light.  Right eye has large scotoma in nasal inferior quadrant. Extraocular movements full without nystagmus. Visual fields full to confrontation. Hearing intact. Facial sensation intact. Face, tongue,  palate moves normally and symmetrically.  Motor: Normal bulk and tone. Normal strength in all tested extremity muscles. Right hand tremor with action and resting tremor in right foot. NEGATIVE COGWHEELING IN BILATERAL WRISTS, ELBOWS  Sensory: intact to touch and pinprick  Coordination: Rapid alternating movements normal in all extremities. Finger-to-nose and heel-to-shin performed accurately bilaterally.  Gait and Station: Arises from chair without difficulty. Stance is normal. Gait demonstrates normal stride length and balance. UNABLE to heel, toe and tandem walk without difficulty.  Reflexes: 1+ and symmetric. Toes downgoing.   DIAGNOSTIC DATA (LABS, IMAGING, TESTING) - I reviewed patient records, labs, notes, testing and imaging myself where available.  Lab Results  Component Value Date   TSH 0.19* 11/18/2013   ASSESSMENT AND PLAN 79 year old lady with a  right brain TIA on 01/03/13 likely from small vessel disease with vascular risk factors of hypertension and prior cerebrovascular disease and carotid stenosis s/p right endarterectomy. New resting tremor in right hand and leg, without other symptoms suggestive of Parkinson's.  PLAN:  Continue aspirin 325 mg for stroke prevention and strict control of hypertension with blood pressure goal below 130/90 and elevated lipids with LDL cholesterol goal below 100 mg percent. Continue Zocor as tolerated. Return for followup in 6 months call earlier if necessary.  Philmore Pali, MSN, NP-C 12/06/2013, 8:36 PM [facility] [address], [place], [place] [address] [phone]  Note: This document was prepared with digital dictation and possible smart phrase technology. Any transcriptional errors that result from this process are unintentional.

## 2013-12-06 ENCOUNTER — Encounter: Payer: Self-pay | Admitting: Nurse Practitioner

## 2013-12-08 ENCOUNTER — Encounter (HOSPITAL_COMMUNITY): Payer: Medicare Other

## 2013-12-10 ENCOUNTER — Encounter (HOSPITAL_COMMUNITY): Payer: Medicare Other

## 2013-12-15 ENCOUNTER — Encounter (HOSPITAL_COMMUNITY)
Admission: RE | Admit: 2013-12-15 | Discharge: 2013-12-15 | Disposition: A | Discharge status: Home / Self Care | Payer: Medicare Other | Source: Ambulatory Visit | Attending: Pulmonary Disease | Admitting: Pulmonary Disease

## 2013-12-15 ENCOUNTER — Encounter: Payer: Medicare Other | Admitting: Internal Medicine

## 2013-12-15 NOTE — Progress Notes (Signed)
Today, [name] exercised at [employer]. Service time was from 1330 to 1515.  The patient exercised for more than 31 minutes performing aerobic, strengthening, and stretching exercises. Oxygen saturation, heart rate, blood pressure, rate of perceived exertion, and shortness of breath were all monitored before, during, and after exercise. [name] presented with no problems at today's exercise session.   There was no workload change during today's exercise session.  Pre-exercise vitals:   Weight kg: 83.2   Liters of O2: RA   SpO2: 95   HR: 68   BP: 112/52   CBG: NA  Exercise vitals:   Highest heartrate:  75   Lowest oxygen saturation: 95   Highest blood pressure: 110/60   Liters of 02: RA  Post-exercise vitals:   SpO2: 97   HR: 67   BP: 112/56   Liters of O2: RA   CBG: NA Dr. Brand Males, Medical Director Dr. Aileen Fass is immediately available during today's Pulmonary Rehab session for [facility] on 12/15/2013 at 1330 class time.

## 2013-12-17 ENCOUNTER — Encounter (HOSPITAL_COMMUNITY)
Admission: RE | Admit: 2013-12-17 | Discharge: 2013-12-17 | Disposition: A | Discharge status: Home / Self Care | Payer: Medicare Other | Source: Ambulatory Visit | Attending: Pulmonary Disease | Admitting: Pulmonary Disease

## 2013-12-17 NOTE — Progress Notes (Signed)
Today, Deosha exercised at [employer]. Service time was from 1330 to 1500.  The patient exercised for more than 31 minutes performing aerobic, strengthening, and stretching exercises. Oxygen saturation, heart rate, blood pressure, rate of perceived exertion, and shortness of breath were all monitored before, during, and after exercise. Zerline presented with no problems at today's exercise session. Zuleyka attended inhaler education session today.  There was no workload change during today's exercise session.  Pre-exercise vitals:   Weight kg: 83.6   Liters of O2: RA   SpO2: 94   HR: 64   BP: 126/62   CBG: NA  Exercise vitals:   Highest heartrate:  89   Lowest oxygen saturation: 96   Highest blood pressure: 126/52   Liters of 02: RA  Post-exercise vitals:   SpO2: 95   HR: 74   BP: 120/56   Liters of O2: RA   CBG: NA  Dr. Brand Males, Medical Director Dr. Dyann Kief is immediately available during today's Pulmonary Rehab session for [facility] on 12/17/2013 at 1330 class time.

## 2013-12-22 ENCOUNTER — Encounter (HOSPITAL_COMMUNITY)
Admission: RE | Admit: 2013-12-22 | Discharge: 2013-12-22 | Disposition: A | Discharge status: Home / Self Care | Payer: Medicare Other | Source: Ambulatory Visit | Attending: Pulmonary Disease | Admitting: Pulmonary Disease

## 2013-12-22 NOTE — Progress Notes (Signed)
Today, [name] exercised at [employer]. Service time was from 1330 to 1500.  The patient exercised for more than 31 minutes performing aerobic, strengthening, and stretching exercises. Oxygen saturation, heart rate, blood pressure, rate of perceived exertion, and shortness of breath were all monitored before, during, and after exercise. [name] presented with no problems at today's exercise session.   There was NO workload change during today's exercise session.  Pre-exercise vitals:   Weight kg: 84.2   Liters of O2: RA   SpO2: 96   HR: 75   BP: 118/62   CBG: NA  Exercise vitals:   Highest heartrate:  107   Lowest oxygen saturation: 95   Highest blood pressure: 124/58   Liters of 02: RA  Post-exercise vitals:   SpO2: 95   HR: 71   BP: 110/56   Liters of O2: RA   CBG: NA  Dr. Brand Males, Medical Director Dr. Dyann Kief is immediately available during today's Pulmonary Rehab session for [facility] on 12/22/2013 at 1330 class time.

## 2013-12-24 ENCOUNTER — Encounter (HOSPITAL_COMMUNITY)
Admission: RE | Admit: 2013-12-24 | Discharge: 2013-12-24 | Disposition: A | Discharge status: Home / Self Care | Payer: Medicare Other | Source: Ambulatory Visit | Attending: Pulmonary Disease | Admitting: Pulmonary Disease

## 2013-12-24 NOTE — Progress Notes (Signed)
Today, [name] exercised at [employer]. Service time was from 1330 to 1530.  The patient exercised for more than 31 minutes performing aerobic, strengthening, and stretching exercises. Oxygen saturation, heart rate, blood pressure, rate of perceived exertion, and shortness of breath were all monitored before, during, and after exercise. [name] presented with no problems at today's exercise session. [name] also attended an education session on the s/s of infection.  There was 1 workload change during today's exercise session.  Pre-exercise vitals:   Weight kg: 84.2   Liters of O2: ra   SpO2: 96   HR: 77   BP: 140/58   CBG: na  Exercise vitals:   Highest heartrate:  85   Lowest oxygen saturation: 96   Highest blood pressure: 124/70   Liters of 02: ra  Post-exercise vitals:   SpO2: 94   HR: 67   BP: 126/62   Liters of O2: ra   CBG: na  Dr. Brand Males, Medical Director Dr. Aileen Fass is immediately available during today's Pulmonary Rehab session for [facility] on 12/24/2013 at 1330 class time.

## 2013-12-29 ENCOUNTER — Encounter (HOSPITAL_COMMUNITY)
Admission: RE | Admit: 2013-12-29 | Discharge: 2013-12-29 | Disposition: A | Discharge status: Home / Self Care | Payer: Medicare Other | Source: Ambulatory Visit | Attending: Pulmonary Disease | Admitting: Pulmonary Disease

## 2013-12-29 DIAGNOSIS — J45909 Unspecified asthma, uncomplicated: Secondary | ICD-10-CM | POA: Insufficient documentation

## 2013-12-29 DIAGNOSIS — Z8673 Personal history of transient ischemic attack (TIA), and cerebral infarction without residual deficits: Secondary | ICD-10-CM | POA: Insufficient documentation

## 2013-12-29 DIAGNOSIS — Z5189 Encounter for other specified aftercare: Secondary | ICD-10-CM | POA: Insufficient documentation

## 2013-12-29 DIAGNOSIS — M25559 Pain in unspecified hip: Secondary | ICD-10-CM | POA: Diagnosis not present

## 2013-12-29 NOTE — Progress Notes (Signed)
Today, [name] exercised at [employer]. Service time was from 1330 to 1515.  The patient exercised for more than 31 minutes performing aerobic, strengthening, and stretching exercises. Oxygen saturation, heart rate, blood pressure, rate of perceived exertion, and shortness of breath were all monitored before, during, and after exercise. [name] presented with no problems at today's exercise session.   There was a workload change during today's exercise session.  Pre-exercise vitals:   Weight kg: 84.5   Liters of O2: ra   SpO2: 99   HR: 66   BP: 120/60   CBG: na  Exercise vitals:   Highest heartrate:  87   Lowest oxygen saturation: 98   Highest blood pressure: 142/56   Liters of 02: ra  Post-exercise vitals:   SpO2: 99   HR: 65   BP: 102/60   Liters of O2: ra   CBG: na  Dr. Brand Males, Medical Director Dr. Aileen Fass is immediately available during today's Pulmonary Rehab session for [facility] on 12/29/2013 at 1515 class time.

## 2013-12-31 ENCOUNTER — Telehealth (HOSPITAL_COMMUNITY): Payer: Self-pay | Admitting: *Deleted

## 2013-12-31 ENCOUNTER — Encounter (HOSPITAL_COMMUNITY): Payer: Medicare Other

## 2014-01-05 ENCOUNTER — Encounter (HOSPITAL_COMMUNITY)
Admission: RE | Admit: 2014-01-05 | Discharge: 2014-01-05 | Disposition: A | Discharge status: Home / Self Care | Payer: Medicare Other | Source: Ambulatory Visit | Attending: Pulmonary Disease | Admitting: Pulmonary Disease

## 2014-01-05 NOTE — Progress Notes (Signed)
[name] presented to pulmonary rehab today and gave a history of feeling weak, more mucus production andwheezing at night for the last 7 days.  She also reports having 2 syncopal episodes in the last 1 week.  She was not allowed to exercise.  I called Dr. Cathlean Cower to report symptoms and obtained a n appointment for Wednesday, January 06, 2014 @ 1:45 pm.  She has used her rescue in [place] two times in the last week.  Patient felt she felt well enough to drive home.  Gave patient appointment information.

## 2014-01-06 ENCOUNTER — Encounter: Payer: Self-pay | Admitting: Internal Medicine

## 2014-01-06 ENCOUNTER — Ambulatory Visit (INDEPENDENT_AMBULATORY_CARE_PROVIDER_SITE_OTHER)
Admission: RE | Admit: 2014-01-06 | Discharge: 2014-01-06 | Disposition: A | Discharge status: Home / Self Care | Payer: Medicare Other | Source: Ambulatory Visit | Attending: Internal Medicine | Admitting: Internal Medicine

## 2014-01-06 ENCOUNTER — Ambulatory Visit (INDEPENDENT_AMBULATORY_CARE_PROVIDER_SITE_OTHER): Payer: Medicare Other | Admitting: Internal Medicine

## 2014-01-06 ENCOUNTER — Other Ambulatory Visit (INDEPENDENT_AMBULATORY_CARE_PROVIDER_SITE_OTHER): Payer: Medicare Other

## 2014-01-06 VITALS — BP 128/62 | HR 83 | Temp 98.6°F | Ht 65.0 in | Wt 186.5 lb

## 2014-01-06 DIAGNOSIS — J449 Chronic obstructive pulmonary disease, unspecified: Secondary | ICD-10-CM | POA: Diagnosis not present

## 2014-01-06 DIAGNOSIS — J209 Acute bronchitis, unspecified: Secondary | ICD-10-CM | POA: Diagnosis not present

## 2014-01-06 DIAGNOSIS — J45901 Unspecified asthma with (acute) exacerbation: Secondary | ICD-10-CM

## 2014-01-06 DIAGNOSIS — I6529 Occlusion and stenosis of unspecified carotid artery: Secondary | ICD-10-CM | POA: Diagnosis not present

## 2014-01-06 DIAGNOSIS — W19XXXA Unspecified fall, initial encounter: Secondary | ICD-10-CM | POA: Insufficient documentation

## 2014-01-06 LAB — CBC WITH DIFFERENTIAL/PLATELET
Basophils Absolute: 0.1 10*3/uL (ref 0.0–0.1)
Basophils Relative: 0.6 % (ref 0.0–3.0)
EOS PCT: 1.9 % (ref 0.0–5.0)
Eosinophils Absolute: 0.2 10*3/uL (ref 0.0–0.7)
HEMATOCRIT: 28.7 % — AB (ref 36.0–46.0)
Hemoglobin: 9.8 g/dL — ABNORMAL LOW (ref 12.0–15.0)
LYMPHS ABS: 1.9 10*3/uL (ref 0.7–4.0)
Lymphocytes Relative: 21.3 % (ref 12.0–46.0)
MCHC: 34.1 g/dL (ref 30.0–36.0)
MCV: 97.9 fl (ref 78.0–100.0)
MONO ABS: 0.6 10*3/uL (ref 0.1–1.0)
Monocytes Relative: 7.1 % (ref 3.0–12.0)
NEUTROS PCT: 69.1 % (ref 43.0–77.0)
Neutro Abs: 6.1 10*3/uL (ref 1.4–7.7)
Platelets: 248 10*3/uL (ref 150.0–400.0)
RBC: 2.93 Mil/uL — ABNORMAL LOW (ref 3.87–5.11)
RDW: 13.4 % (ref 11.5–15.5)
WBC: 8.9 10*3/uL (ref 4.0–10.5)

## 2014-01-06 LAB — BASIC METABOLIC PANEL
BUN: 17 mg/dL (ref 6–23)
CO2: 26 mEq/L (ref 19–32)
CREATININE: 1.1 mg/dL (ref 0.4–1.2)
Calcium: 9.3 mg/dL (ref 8.4–10.5)
Chloride: 108 mEq/L (ref 96–112)
GFR: 49.2 mL/min — AB (ref >60.00)
Glucose, Bld: 99 mg/dL (ref 70–99)
Potassium: 3.9 mEq/L (ref 3.5–5.1)
Sodium: 141 mEq/L (ref 135–145)

## 2014-01-06 LAB — HEPATIC FUNCTION PANEL
ALT: 14 U/L (ref 0–35)
AST: 14 U/L (ref 0–37)
Albumin: 3.4 g/dL — ABNORMAL LOW (ref 3.5–5.2)
Alkaline Phosphatase: 68 U/L (ref 39–117)
BILIRUBIN TOTAL: 0.5 mg/dL (ref 0.2–1.2)
Bilirubin, Direct: 0.1 mg/dL (ref 0.0–0.3)
Total Protein: 6.8 g/dL (ref 6.0–8.3)

## 2014-01-06 MED ORDER — PREDNISONE 10 MG PO TABS: ORAL_TABLET | ORAL | Status: DC

## 2014-01-06 MED ORDER — SIMVASTATIN 10 MG PO TABS: 10.0000 mg | ORAL_TABLET | Freq: Every day | ORAL | Status: DC

## 2014-01-06 MED ORDER — METHYLPREDNISOLONE ACETATE 80 MG/ML IJ SUSP
80.0000 mg | Freq: Once | INTRAMUSCULAR | Status: AC
  Administered 2014-01-06: 80 mg via INTRAMUSCULAR

## 2014-01-06 MED ORDER — AZITHROMYCIN 250 MG PO TABS: ORAL_TABLET | ORAL | Status: DC

## 2014-01-06 MED ORDER — FLUTICASONE-SALMETEROL 250-50 MCG/DOSE IN AEPB: 1.0000 | INHALATION_SPRAY | Freq: Two times a day (BID) | RESPIRATORY_TRACT | Status: DC

## 2014-01-06 MED ORDER — HYDROCODONE-HOMATROPINE 5-1.5 MG/5ML PO SYRP: 5.0000 mL | ORAL_SOLUTION | Freq: Four times a day (QID) | ORAL | Status: DC | PRN

## 2014-01-06 NOTE — Assessment & Plan Note (Signed)
Also for cbc, bmet, lft's today,  to f/u any worsening symptoms or concerns

## 2014-01-06 NOTE — Progress Notes (Addendum)
Subjective:    Patient ID: [name], female    DOB: 1934-10-02, 79 y.o.   MRN: [id number]  HPI  Here to f/u with onset 4 days feeling warm, general weakness and malaise, mild prod cough (not sure of color but no blood), worsening cough and now wheezing in last 2 days, really kept her up last night, was sent home from pulm rehab yesterday without starting;  Last episode "quite a while ago" , has dx of recurrent bronchitis on chart, as well as asthma.  [place] last Friday without loc, though did graze the upper lip on brick fireplace (ok siince then).  Has known heart valve dz as well, for echo planned very soon..  Also states lexapro has really helped - Denies worsening depressive symptoms, suicidal ideation, or panic. No longer feels she needs the lorazepam Past Medical History  Diagnosis Date  . History of sudden visual loss   . Acute cystitis   . Bronchitis, acute   . Hypertension   . Cerebrovascular disease   . Hypothyroidism   . GERD (gastroesophageal reflux disease)   . Diverticulosis of colon   . DJD (degenerative joint disease)   . Back pain   . Transient ischemic attack   . Anxiety   . Depression   . Asthma   . COPD (chronic obstructive pulmonary disease)   . Shortness of breath     with exertion   . Pneumonia     hx of x 2   . 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 UTI (lower urinary tract infection)     hx of   . Carotid artery occlusion   . Hyperlipidemia   . Aortic stenosis, mild 11/18/2013   Past Surgical History  Procedure Laterality Date  . Vaginal hysterectomy    . Sigmoid colectomy    . Tubal ligation    . Replacement total knee      right and left knee  . Cardiothoracic procedure  01/13/2009    Right   . Right foot surgery       nerve cut between toes   . Endartarectomy      right carotid endartarectomy - 2010  . Total knee arthroplasty Left 11/17/2012    Procedure: TOTAL KNEE ARTHROPLASTY;  Surgeon: Gearlean Alf, MD;  Location: [facility];   Service: Orthopedics;  Laterality: Left;    reports that she has never smoked. She has never used smokeless tobacco. She reports that she does not drink alcohol or use illicit drugs. family history includes Coronary artery disease in her sister; Emphysema in her father; Heart attack in her brother; Heart disease in her mother and sister; Rheumatic fever in her brother. Allergies  Allergen Reactions  . Relafen [Nabumetone] Diarrhea and Other (See Comments)    GI / Urinary Bleeding  . Lipitor [Atorvastatin]     Causes memory loss  . Penicillins Hives, Itching, Swelling and Rash   Current Outpatient Prescriptions on File Prior to Visit  Medication Sig Dispense Refill  . aspirin EC 325 MG tablet Take 1 tablet (325 mg total) by mouth daily.  30 tablet  0  . Cholecalciferol (VITAMIN D) 2000 UNITS CAPS Take 1 capsule by mouth daily.      . Cranberry-Vitamin C-Probiotic (AZO CRANBERRY) 250-30 MG TABS Take 1 capsule by mouth daily.      Marland Kitchen escitalopram (LEXAPRO) 10 MG tablet Take 1 tablet (10 mg total) by mouth daily.  90 tablet  3  . ferrous sulfate 325 (65 FE) MG tablet Take 325 mg by mouth daily with breakfast.      . fish oil-omega-3 fatty acids 1000 MG capsule Take 2 g by mouth daily.      . Fluticasone-Salmeterol (ADVAIR DISKUS) 250-50 MCG/DOSE AEPB Inhale 1 puff into the lungs 2 (two) times daily.  3 each  3  . levothyroxine (SYNTHROID, LEVOTHROID) 88 MCG tablet Take 1 tablet (88 mcg total) by mouth daily.  90 tablet  3  . LORazepam (ATIVAN) 0.5 MG tablet Take 1/2 to 1 tablet by mouth two times daily  60 tablet  5  . losartan (COZAAR) 100 MG tablet Take 1 tablet (100 mg total) by mouth daily.  90 tablet  1  . Multiple Vitamins-Minerals (WOMENS MULTIVITAMIN PLUS PO) Take 1 tablet by mouth daily.      . naproxen sodium (ANAPROX) 220 MG tablet Take 220 mg by mouth 2 (two) times daily with a meal.      . Nutritional Supplements (OSTEO ADVANCE) TABS Take 1 tablet by mouth daily.      Marland Kitchen omeprazole  (PRILOSEC) 20 MG capsule TAKE 1 CAPSULE (20 MG TOTAL) BY MOUTH DAILY.  90 capsule  3   Current Facility-Administered Medications on File Prior to Visit  Medication Dose Route Frequency Provider Last Rate Last Dose  . pneumococcal 13-valent conjugate vaccine (PREVNAR 13) injection 0.5 mL  0.5 mL Intramuscular Once Biagio Borg, MD       Review of Systems  Constitutional: Negative for unusual diaphoresis or other sweats  HENT: Negative for ringing in ear Eyes: Negative for double vision or worsening visual disturbance.  Respiratory: Negative for choking and stridor.   Gastrointestinal: Negative for vomiting or other signifcant bowel change Genitourinary: Negative for hematuria or decreased urine volume.  Musculoskeletal: Negative for other MSK pain or swelling Skin: Negative for color change and worsening wound.  Neurological: Negative for tremors and numbness other than noted  Psychiatric/Behavioral: Negative for decreased concentration or agitation other than above  ;    Objective:   Physical Exam BP 128/62  Pulse 83  Temp(Src) 98.6 F (37 C) (Oral)  Ht 5\' 5"  (1.651 m)  Wt 186 lb 8 oz (84.596 kg)  BMI 31.04 kg/m2  SpO2 98% VS noted,  Constitutional: Pt appears well-developed, well-nourished.  HENT: Head: NCAT.  Right Ear: External ear normal.  Left Ear: External ear normal.  Eyes: . Pupils are equal, round, and reactive to light. Conjunctivae and EOM are normal Neck: Normal range of motion. Neck supple.  Bilat tm's with no erythema.  Max sinus areas non tender.  Pharynx with mild erythema, no exudate Cardiovascular: Normal rate and regular rhythm.   Pulmonary/Chest: Effort normal and breath sounds decreased bilat with few wheeze, no rhonchi or rales.  Abd:  Soft, NT, ND, + BS Neurological: Pt is alert. Not confused , motor grossly intact Skin: Skin is warm. No rash, no LE edema Psychiatric: Pt behavior is normal. No agitation. mild nervous, but not depressed affect      Assessment & Plan:

## 2014-01-06 NOTE — Assessment & Plan Note (Addendum)
Mild to mod, for depomedrol IM, predpac asd,  to f/u any worsening symptoms or concerns, cough med prn; also for cxr - r/o pna vs volume overload with hx of mild valve dz

## 2014-01-06 NOTE — Addendum Note (Signed)
Addended by: Biagio Borg on: 01/06/2014 02:10 PM   Modules accepted: Orders, Medications

## 2014-01-06 NOTE — Addendum Note (Signed)
Addended by: Biagio Borg on: 01/06/2014 02:13 PM   Modules accepted: Orders

## 2014-01-06 NOTE — Patient Instructions (Addendum)
You had the steroid shot today  Please take all new medication as prescribed - the antibiotic, cough medicine, and prednisone  Please go to the XRAY Department in the Basement (go straight as you get off the elevator) for the x-ray testing  You will be contacted by phone if any changes need to be made immediately.  Otherwise, you will receive a letter about your results with an explanation, but please check with MyChart first.  Please continue all other medications as before, and refills have been done if requested.  Please have the pharmacy call with any other refills you may need.  Please keep your appointments with your specialists as you may have planned such as the echocardiogram  Please go to the LAB in the Basement (turn left off the elevator) for the tests to be done today

## 2014-01-06 NOTE — Progress Notes (Signed)
Pre visit review using our clinic review tool, if applicable. No additional management support is needed unless otherwise documented below in the visit note. 

## 2014-01-06 NOTE — Assessment & Plan Note (Signed)
Mild to mod, for antibx course,  to f/u any worsening symptoms or concerns 

## 2014-01-07 ENCOUNTER — Telehealth (HOSPITAL_COMMUNITY): Payer: Self-pay

## 2014-01-07 ENCOUNTER — Encounter (HOSPITAL_COMMUNITY): Admission: RE | Admit: 2014-01-07 | Payer: Medicare Other | Source: Ambulatory Visit

## 2014-01-08 ENCOUNTER — Encounter: Payer: Self-pay | Admitting: Internal Medicine

## 2014-01-08 ENCOUNTER — Ambulatory Visit (INDEPENDENT_AMBULATORY_CARE_PROVIDER_SITE_OTHER): Payer: Medicare Other | Admitting: Internal Medicine

## 2014-01-08 VITALS — BP 120/72 | HR 83 | Temp 98.1°F | Wt 188.4 lb

## 2014-01-08 DIAGNOSIS — J45901 Unspecified asthma with (acute) exacerbation: Secondary | ICD-10-CM

## 2014-01-08 DIAGNOSIS — I6529 Occlusion and stenosis of unspecified carotid artery: Secondary | ICD-10-CM | POA: Diagnosis not present

## 2014-01-08 DIAGNOSIS — J189 Pneumonia, unspecified organism: Secondary | ICD-10-CM

## 2014-01-08 DIAGNOSIS — I10 Essential (primary) hypertension: Secondary | ICD-10-CM

## 2014-01-08 NOTE — Assessment & Plan Note (Signed)
stable overall by history and exam, recent data reviewed with pt, and pt to continue medical treatment as before,  to f/u any worsening symptoms or concerns BP Readings from Last 3 Encounters:  01/08/14 120/72  01/06/14 128/62  12/04/13 134/60

## 2014-01-08 NOTE — Progress Notes (Signed)
Pre visit review using our clinic review tool, if applicable. No additional management support is needed unless otherwise documented below in the visit note. 

## 2014-01-08 NOTE — Patient Instructions (Signed)
Please continue all other medications as before, and refills have been done if requested.  Please have the pharmacy call with any other refills you may need.  Please continue your efforts at being more active, low cholesterol diet, and weight control.  Please keep your appointments with your specialists as you may have planned     

## 2014-01-08 NOTE — Assessment & Plan Note (Signed)
Improved, cont same tx,  to f/u any worsening symptoms or concerns 

## 2014-01-08 NOTE — Assessment & Plan Note (Signed)
Fortunately improving clinically, cont same tx, no further changes or xrays needed at this time, to call for any worsening s/s

## 2014-01-08 NOTE — Progress Notes (Signed)
Subjective:    Patient ID: [name], female    DOB: 1935/01/21, 79 y.o.   MRN: [id number]  HPI  Here to f/u, overall improved today after 2 days antibx tx after seen June 10 with cxr c/w bilat patchy pna.  States overall stamina and sense of wellbeing improved, less cough, nonprod, no blood, no f/c or worsening sob/doe/wob/cp/fever/chills  No recent falls.  No other new complaints  WBC normal June 10 Past Medical History  Diagnosis Date  . History of sudden visual loss   . Acute cystitis   . Bronchitis, acute   . Hypertension   . Cerebrovascular disease   . Hypothyroidism   . GERD (gastroesophageal reflux disease)   . Diverticulosis of colon   . DJD (degenerative joint disease)   . Back pain   . Transient ischemic attack   . Anxiety   . Depression   . Asthma   . COPD (chronic obstructive pulmonary disease)   . Shortness of breath     with exertion   . Pneumonia     hx of x 2   . 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 UTI (lower urinary tract infection)     hx of   . Carotid artery occlusion   . Hyperlipidemia   . Aortic stenosis, mild 11/18/2013   Past Surgical History  Procedure Laterality Date  . Vaginal hysterectomy    . Sigmoid colectomy    . Tubal ligation    . Replacement total knee      right and left knee  . Cardiothoracic procedure  01/13/2009    Right   . Right foot surgery       nerve cut between toes   . Endartarectomy      right carotid endartarectomy - 2010  . Total knee arthroplasty Left 11/17/2012    Procedure: TOTAL KNEE ARTHROPLASTY;  Surgeon: Gearlean Alf, MD;  Location: [facility];  Service: Orthopedics;  Laterality: Left;    reports that she has never smoked. She has never used smokeless tobacco. She reports that she does not drink alcohol or use illicit drugs. family history includes Coronary artery disease in her sister; Emphysema in her father; Heart attack in her brother; Heart disease in her mother and sister; Rheumatic fever in  her brother. Allergies  Allergen Reactions  . Relafen [Nabumetone] Diarrhea and Other (See Comments)    GI / Urinary Bleeding  . Lipitor [Atorvastatin]     Causes memory loss  . Penicillins Hives, Itching, Swelling and Rash   Current Outpatient Prescriptions on File Prior to Visit  Medication Sig Dispense Refill  . aspirin EC 325 MG tablet Take 1 tablet (325 mg total) by mouth daily.  30 tablet  0  . azithromycin (ZITHROMAX Z-PAK) 250 MG tablet Use as directed  6 each  1  . Cholecalciferol (VITAMIN D) 2000 UNITS CAPS Take 1 capsule by mouth daily.      . Cranberry-Vitamin C-Probiotic (AZO CRANBERRY) 250-30 MG TABS Take 1 capsule by mouth daily.      Marland Kitchen escitalopram (LEXAPRO) 10 MG tablet Take 1 tablet (10 mg total) by mouth daily.  90 tablet  3  . ferrous sulfate 325 (65 FE) MG tablet Take 325 mg by mouth daily with breakfast.      . fish oil-omega-3 fatty acids 1000 MG capsule Take 2 g by mouth daily.      . Fluticasone-Salmeterol (ADVAIR DISKUS) 250-50 MCG/DOSE AEPB  Inhale 1 puff into the lungs 2 (two) times daily.  3 each  3  . HYDROcodone-homatropine (HYCODAN) 5-1.5 MG/5ML syrup Take 5 mLs by mouth every 6 (six) hours as needed for cough.  180 mL  0  . levothyroxine (SYNTHROID, LEVOTHROID) 88 MCG tablet Take 1 tablet (88 mcg total) by mouth daily.  90 tablet  3  . losartan (COZAAR) 100 MG tablet Take 1 tablet (100 mg total) by mouth daily.  90 tablet  1  . Multiple Vitamins-Minerals (WOMENS MULTIVITAMIN PLUS PO) Take 1 tablet by mouth daily.      . naproxen sodium (ANAPROX) 220 MG tablet Take 220 mg by mouth 2 (two) times daily with a meal.      . Nutritional Supplements (OSTEO ADVANCE) TABS Take 1 tablet by mouth daily.      Marland Kitchen omeprazole (PRILOSEC) 20 MG capsule TAKE 1 CAPSULE (20 MG TOTAL) BY MOUTH DAILY.  90 capsule  3  . predniSONE (DELTASONE) 10 MG tablet 3 tabs by mouth per day for 3 days,2tabs per day for 3 days,1tab per day for 3 days  18 tablet  0  . simvastatin (ZOCOR) 10 MG  tablet Take 1 tablet (10 mg total) by mouth at bedtime.  90 tablet  3   Current Facility-Administered Medications on File Prior to Visit  Medication Dose Route Frequency Provider Last Rate Last Dose  . pneumococcal 13-valent conjugate vaccine (PREVNAR 13) injection 0.5 mL  0.5 mL Intramuscular Once Biagio Borg, MD       Review of Systems  Constitutional: Negative for unusual diaphoresis or other sweats  HENT: Negative for ringing in ear Eyes: Negative for double vision or worsening visual disturbance.  Respiratory: Negative for choking and stridor.   Gastrointestinal: Negative for vomiting or other signifcant bowel change Genitourinary: Negative for hematuria or decreased urine volume.  Musculoskeletal: Negative for other MSK pain or swelling Skin: Negative for color change and worsening wound.  Neurological: Negative for tremors and numbness other than noted  Psychiatric/Behavioral: Negative for decreased concentration or agitation other than above       Objective:   Physical Exam VS noted,  Constitutional: Pt appears well-developed, well-nourished.  HENT: Head: NCAT.  Right Ear: External ear normal.  Left Ear: External ear normal.  Eyes: . Pupils are equal, round, and reactive to light. Conjunctivae and EOM are normal Neck: Normal range of motion. Neck supple.  Cardiovascular: Normal rate and regular rhythm.   Pulmonary/Chest: Effort normal and breath sounds with bibas few crackles. , no rales or wheezing or rhonchi Neurological: Pt is alert. Not confused , motor grossly intact Skin: Skin is warm. No rash Psychiatric: Pt behavior is normal. No agitation.     Assessment & Plan:

## 2014-01-09 ENCOUNTER — Telehealth: Payer: Self-pay | Admitting: Internal Medicine

## 2014-01-09 NOTE — Telephone Encounter (Signed)
Relevant patient education mailed to patient.  

## 2014-01-11 ENCOUNTER — Ambulatory Visit (HOSPITAL_COMMUNITY): Payer: Medicare Other | Attending: Cardiology | Admitting: Radiology

## 2014-01-11 DIAGNOSIS — I350 Nonrheumatic aortic (valve) stenosis: Secondary | ICD-10-CM

## 2014-01-11 DIAGNOSIS — E669 Obesity, unspecified: Secondary | ICD-10-CM | POA: Insufficient documentation

## 2014-01-11 DIAGNOSIS — I369 Nonrheumatic tricuspid valve disorder, unspecified: Secondary | ICD-10-CM

## 2014-01-11 DIAGNOSIS — I079 Rheumatic tricuspid valve disease, unspecified: Secondary | ICD-10-CM | POA: Diagnosis not present

## 2014-01-11 DIAGNOSIS — I359 Nonrheumatic aortic valve disorder, unspecified: Secondary | ICD-10-CM | POA: Insufficient documentation

## 2014-01-11 DIAGNOSIS — R0989 Other specified symptoms and signs involving the circulatory and respiratory systems: Secondary | ICD-10-CM

## 2014-01-11 DIAGNOSIS — R0609 Other forms of dyspnea: Secondary | ICD-10-CM

## 2014-01-11 DIAGNOSIS — R0602 Shortness of breath: Secondary | ICD-10-CM

## 2014-01-11 DIAGNOSIS — E785 Hyperlipidemia, unspecified: Secondary | ICD-10-CM | POA: Diagnosis not present

## 2014-01-11 DIAGNOSIS — I059 Rheumatic mitral valve disease, unspecified: Secondary | ICD-10-CM

## 2014-01-11 DIAGNOSIS — I10 Essential (primary) hypertension: Secondary | ICD-10-CM | POA: Insufficient documentation

## 2014-01-11 NOTE — Progress Notes (Signed)
Echocardiogram performed.  

## 2014-01-12 ENCOUNTER — Encounter (HOSPITAL_COMMUNITY)
Admission: RE | Admit: 2014-01-12 | Discharge: 2014-01-12 | Disposition: A | Discharge status: Home / Self Care | Payer: Medicare Other | Source: Ambulatory Visit | Attending: Pulmonary Disease | Admitting: Pulmonary Disease

## 2014-01-12 NOTE — Progress Notes (Signed)
Today, [name] exercised at [employer]. [facility]. Service time was from 1330 to 1515.  The patient exercised for more than 31 minutes performing aerobic, strengthening, and stretching exercises. Oxygen saturation, heart rate, blood pressure, rate of perceived exertion, and shortness of breath were all monitored before, during, and after exercise. [name] presented with complaints of feeling weak  at today's exercise session. She is recovering from pneumonia and was instructed to work at a lower level today.    There was no workload change during today's exercise session.  Pre-exercise vitals:   Weight kg: 86.8   Liters of O2: RA   SpO2: 94   HR: 74   BP: 166/70   CBG: NA  Exercise vitals:   Highest heartrate:  76   Lowest oxygen saturation: 95   Highest blood pressure: 142/60   Liters of 02: RA  Post-exercise vitals:   SpO2: 95   HR: 73   BP: 140/68   Liters of O2: RA   CBG: NA Dr. Brand Males, Medical Director Dr. Aileen Fass is immediately available during today's Pulmonary Rehab session for [facility] on 01/12/2014 at 1330 class time.

## 2014-01-14 ENCOUNTER — Encounter (HOSPITAL_COMMUNITY): Payer: Medicare Other

## 2014-01-14 ENCOUNTER — Telehealth (HOSPITAL_COMMUNITY): Payer: Self-pay | Admitting: Internal Medicine

## 2014-01-18 ENCOUNTER — Telehealth (HOSPITAL_COMMUNITY): Payer: Self-pay

## 2014-01-19 ENCOUNTER — Telehealth (HOSPITAL_COMMUNITY): Payer: Self-pay

## 2014-01-19 ENCOUNTER — Ambulatory Visit (INDEPENDENT_AMBULATORY_CARE_PROVIDER_SITE_OTHER): Payer: Medicare Other | Admitting: Pulmonary Disease

## 2014-01-19 ENCOUNTER — Encounter: Payer: Self-pay | Admitting: Pulmonary Disease

## 2014-01-19 ENCOUNTER — Encounter (HOSPITAL_COMMUNITY): Payer: Medicare Other

## 2014-01-19 VITALS — BP 132/72 | HR 67 | Temp 98.1°F | Ht 65.0 in | Wt 190.2 lb

## 2014-01-19 DIAGNOSIS — R0989 Other specified symptoms and signs involving the circulatory and respiratory systems: Secondary | ICD-10-CM | POA: Diagnosis not present

## 2014-01-19 DIAGNOSIS — R5381 Other malaise: Secondary | ICD-10-CM | POA: Diagnosis not present

## 2014-01-19 DIAGNOSIS — R0609 Other forms of dyspnea: Secondary | ICD-10-CM

## 2014-01-19 DIAGNOSIS — I272 Pulmonary hypertension, unspecified: Secondary | ICD-10-CM | POA: Insufficient documentation

## 2014-01-19 DIAGNOSIS — I38 Endocarditis, valve unspecified: Secondary | ICD-10-CM | POA: Diagnosis not present

## 2014-01-19 DIAGNOSIS — I2789 Other specified pulmonary heart diseases: Secondary | ICD-10-CM

## 2014-01-19 DIAGNOSIS — R0600 Dyspnea, unspecified: Secondary | ICD-10-CM

## 2014-01-19 DIAGNOSIS — I679 Cerebrovascular disease, unspecified: Secondary | ICD-10-CM

## 2014-01-19 DIAGNOSIS — I6529 Occlusion and stenosis of unspecified carotid artery: Secondary | ICD-10-CM | POA: Diagnosis not present

## 2014-01-19 DIAGNOSIS — I10 Essential (primary) hypertension: Secondary | ICD-10-CM

## 2014-01-19 NOTE — Patient Instructions (Signed)
Today we updated your med list in our EPIC system...    Continue your current medications the same...  Keep your follow up appt w/ DrMcLean to discuss further eval & treatment for your heart valve problem...  Hold off on the Pulmonary Rehab visits for now until you have completed the cardiology eval & rx...  Call for any questions...  Let's plan a follow up visit in 3-63months.Marland KitchenMarland Kitchen

## 2014-01-19 NOTE — Progress Notes (Signed)
Subjective:     Patient ID: [name], female   DOB: 03/16/1935, 79 y.o.   MRN: [id number]  HPI 79 y/o WF here for a follow up visit... she is followed both here and at the [place] where she gets her meds ([facility])... she has mult med problems including hx refractory AB requiring Advair, Ventolin, Mucinex;  Cerebrovas dis w/ right CAE 6/10 by DrLawson;  Hypothy on Synthroid;  GERD & Divertics w/ sigm colectomy 2003 for diverticulitis;  DJD w/ right TKR 2008 by DrAlusio; and Anxiety...  ~  Dec 18, 2011:  69mo ROV & post ER follow up> After last visit we increased her Losartan to 100mg /d for BP but she says she "can't tolerate 100mg  doses" and she feels it caused cramping all over- went to ER & decreased to 1/2 tab w/ improvement back to baseline;  Review of records indicates ER visit 11/03/11 w/ 2wk hx incr SOB/DOE, she had hypoxemia & placed on O2 at 1L/min, CXR & labs were neg, note> prev hx AB on Advair that she stopped on her own, weight down 10# on Nutrisys diet, they cut her Cozaar to 50mg  due to Creat of 1.3 on labs...  Now she feels she is back to baseline since Losartan back down to 50mg  dose, O2 sats are normal & ambulatory O2 monitor here today on RA showed sat= 97% RA at rest (HR=89), and after 3 laps sat=96% RA (HR=102), therefore we will have the Oxygen picked up & she is pleased...  CXR 4/13 showed normal heart size, incr lung vols, clear x scarring left base, NAD... PFT 4/13 showed FVC= 2.45 (84%), FEV1= 1.83 (84%), %1sec= 75, mid-flows= 84% predicted... LABS 4/13:  Chems- ok x BUN=32 Creat=1.3;  CBC- ok w/ Hg=12.4  ~  August 27, 2012:  13mo ROV & pre-op clearance for DrAlusio's planned left TKR> but she's not sure she wants it & found a supplement off the internet "Alleviate" that she is very hopeful will help her knee & prevent her from needing surg (I told he OK to try the supplement but don't cancel the surg already sched for 4/14 w/ DrAlusio... We reviewed the following medical problems  during today's office visit >>     AB> she is NOT using Advair, has ProventilHFA as needed; no recent URIs or resp exac but c/o DOE w/ any activity, she is way too sedentary/ no exercise at all; O2 sat on RA=99% and she was ambulated in office- 3laps w/ O2sat nadir=96%; advised [facility]...    HBP> on Losartan50; BP= 124/80 & similar at home she says; denies HA, CP, palpit, ch in SOB, edema, etc...    Cerebrovasc dis> on ASA daily; s/p right CAE 2010 by Sheryn Bison; she has yearly f/u w/ [facility] & last seen 8/13 w/ Carotid Duplex showing patent right CAE w/o restenosis, & 0-39% left ICA stenosis w/ antegrade vertebrals (no change from last yr); she denies cerebral ischemic symptoms & continues f/u DrMatthews [facility] for stroke in right eye w/ visual impairment...    Hypothy> on Synthroid100; labs 3/13 showed TSH=0.92 & she is clinically & biochemically euthyroid...    GI> GERD on Prilosec20/d, Hx divertics w/ sigmoid colectomy 2003 by DrHoxworth; prev followed by DrWeissman- ?last colon 2003? Referred to [place] for on-going care but she has yet to resched her colonoscopy & reminded to do so...    DJD> she is s/p right TKR 2008 by DrAlusio; known DJD in spine, &  bursitis in right hip w/ shot from DrAlusio; she is bone on bone in the left knee & DrAlusio has sched TKR for 4/14- she wants to try supplement "Alleviate" first; she uses Vicodin & osteo-biflex as well; "I think I'm just getting old"...    Anxiety/ Depression> not on anxiolytic meds but under stress w/ family issues; she declines med rx... We reviewed prob list, meds, xrays and labs> see below for updates >> OK for surgery...  NOTE:  Cards [facility]14 for Cardiac clearance prior to left THR> hx exertional dyspnea, HBP, carotid dis w/ prev R-CAE 2010 by Sheryn Bison; he rec adding HCT & Atorva20 due to vasc dis; 2DEcho 4/14 showed norm LV size & fuction w/ EF=60-65%, Gr2DD, mild AI, modMR, mild LA&RA dil,  PAsys=49;  Myoview 4/14 showed hypertensive BP response, no scar or ischemia, normal wall motion, study not gated...   ~  February 24, 2013:  89mo ROV & post hosp check> she presented to the ER 01/02/13 w/ aphasia, drooling, & left facial droop that occurred while off her ASA for knee surg (on Xarelto after knee surg); all symptoms resolved by the time she was eval in the ER- CT Brain showed mild chr sm vessel dis, otherw wnl, NAD; she refused adm & was seen by [facility] in their office; she has hx right CAE by [facility] 6/10; DrSethi rec to restart ASA325, restart Lipitor she had stopped on her own (she refused but did agree to Cres10), & complete stroke w/u as outpt> SEE BELOW (included 2DEcho 6/14 showing norm LV size & function w/ EF=60-65%, Gr1DD, mildAS, modAI, mildMR, modTR, PAsys=36)...  We reviewed the following medical problems during today's office visit >>     AB> she is NOT using Advair, has ProventilHFA prn use; no recent URIs or resp exac but c/o DOE as before, she is way too sedentary/ no exercise at all; O2 sat on RA=97% and she was prev ambulated in office w/o desat; advised to start [facility] program [facility] or [facility]...    HBP> on Losartan50; BP= 150/64 but better at home she says; weight is down 23# to 168# today; DrMcLean tried to start HCT & K10 but she didn't follow through; denies HA, CP, palpit, ch in SOB, edema, etc; she had pre-op cardiac clearance from [place] was non-ischemic...    Cerebrovasc dis> on ASA325 daily; s/p right CAE 2010 by [facility]; she has yearly f/u w/ [facility] & last seen 8/13 w/ Carotid Duplex showing patent right CAE w/o restenosis, & 0-39% left ICA stenosis w/ antegrade vertebrals; she had TIA as above w/ neg outpt eval by [facility], & continues f/u DrMatthews [facility] for stroke in right eye w/ visual impairment...    CHOL> she refused the Lipitor20 perceiving some side effect & Neuro convinced her to try CRESTOR10=> due for FLP on the Cres10  soon...    Hypothy> on Synthroid100; labs 3/13 showed TSH=0.92 & she is clinically & biochemically euthyroid...    GI> GERD on Prilosec20/d, Hx divertics w/ sigmoid colectomy 2003 by DrHoxworth; prev followed by DrWeissman- ?last colon 2003? Referred to [place] for on-going care but she has yet to resched her colonoscopy & reminded to do so...    DJD> she is s/p right TKR 2008 by DrAlusio; known DJD in spine, & bursitis in right hip w/ shot from DrAlusio; s/p left TKR 4/14 by DrAlusio & the knee is doing satis, but she had TIA while off the ASA (  on Xarelto) in the post op period=> quickly resolved back to norm.    TIA> SEE ABOVE; she is now back on ASA325mg /d    Anxiety/ Depression> she is very tearful- "my husb has Alzheimers" & I again offered anxiolytic rx but she declines... We reviewed prob list, meds, xrays and labs> see below for updates >> SHE GETS ALL HER MEDS FROM [facility]...  ~ May 18, 2013: 14mo ROV & Mikaiya is c/o SOB, DOE that has been building up for a yr she says, "it's not my weight" which is up 9# to 177# today; she denies cough, sputum, hemoptysis, CP, etc; but she has noted some wheezing even though she says "my asthma is doing ok"; NOTE: prev PFTs 5/13 were wnl and CXR 4/14 was essentially clear, NAD; we decided to obtain a diagnostic work up w/ CXR, PFT, Ambulatory O2 sat eval, and Labs... As noted prev- she is not using her Advair, just the Albut HFA inhaler prn... She has an agenda today- she wants ADVAIR restarted "like before"; but in light of today's evaluation she is rec to start exercise program, join [employer] [facility] [employer], start MVI/ Fe/ VitD2000...  We reviewed prob list, meds, xrays and labs> see below for updates >> OK 2014 Flu vaccine today...   Last CXR was 4/14> norm heart size, clear lungs x left basilar scarring, mild DJD Tspine, NAD...  PFTs 10/14 showed FVC=1.68 (58%), FEV1=1.29 (60%), %1sec=77, mid-flows=64% predicted; c/w mild sm airways dis & prob  mild restriction...   Ambulatory O2 test> On RA- O2sat=96% at rest w/ HR=81; Exercised 2Laps w/ lowest O2sat=96% w/ HR=96...  LABS 10/14: FLP- at goals on Simva10 from the [place]; Chems- wnl w/ Cr=1.3; CBC- sl anemic w/ Hg=10.0, MCV=94. Fe=78 (20%sat); TSH=0.38 on Synth100; VitD=35; She is rec to take MVI, Fe, VitD2000...  ~  September 21, 2013:  21mo ROV & Daryana indicates that she has not followed our instructions after her last visit to start a regular exercise program- no PulmRehab (refused due to all the flu going around), no Silver Sneakers, no home exercise on her own; she also notes that she is not any better than before- we reviewed the results of her 10/14 eval- she requests a small portable O2 tank to take w/ her when out & about but we discussed the expense since she doesn't qualify under Medicare; she tells me that DrSethi tried her on Xanax but it was too strong & made her too sleepy "my shaking is really bad" (no tremor noticed on today's exam); she tells me that she is now ready for PulmRehab program and will try ATIVAN 0.5mg - 1/2 to 1 tab Tid...     She is also c/o neck pain, back pain, & rec to take OTC anti-inflamm rx + heat & f/u w/ her Ortho, DrAlusio...     We reviewed prob list, meds, xrays and labs> see below for updates >> Given TDAP today...  ~  January 19, 2014:  68mo ROV & Ia has had a lot going on during the interval>>    She started [employer] program in April & had a baseline 41minWT w/ ambulation of 711ft, 0 breaks, lowest O2sat on RA was 94% & highest HR=39;  She had difficulty initially due to right hip pain, but more recently w/ incr exertional dyspnea- see below- f/u [facility]cho [facility] revealed severe MR and PulmHTN w/ PAsys=65...     She has established w/ DrJohn for primary care & saw him again  01/06/14 for weakness, malaise, cough, SOB, wheezing; she was afebrile, no purulent phlegm, no consolidation on exam, & treated for bronchitis w/ ZPak, Pred taper, Hycodan;  CXR 6/15  showed borderline cardiomeg, chronic changes and sl incr markings at bases;  Labs showed norm chemistries and mod anemia w/ Hg=9.8 and wbc normal at 8,900 w/ normal diff; she is improved but has her chr exertional dyspnea as before...    She had a yearly Cards f/u DrMcLean 4/15> HBP, Valv heart dis, hx carotid stenosis; prev Myoview 4/14 was neg for ischemia or infarction; prev Echo's from 4/14 & 6/14 reviewed (valv heart dis- modAI&TR, mild-modMR, & PulmHTN); he ordered a repeat 2DEcho to check for valve progression but it wasn't done until 6/15 and revealed norm LV size & function w/ EF=60-65%, modAI, severeMR, modTR, severe LAdil, PAsys-65 => she has f/u appt pending to discuss further eval & rx options (?cath, ?valve surg, etc)...     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  BRONCHITIS, RECURRENT and Hx ASTHMA - she is a non-smoker...  EXERTIONAL DYSPNEA >>  ~  episode refractory AB finally resolved 4/10 w/ Ab's/ Pred/ Mucinex/ etc; prev on Advair she stopped on her own & just uses AlbutHFA inhaler as needed... ~  10/14:  Presents w/ c/o 12yr slowly progressive dyspnea "no breath at all" but exam was neg & work up revealed ?pulm restriction vs poor effort and deconditioning> she wanted Advair restarted-ok, plus rec for exercise program PulmRehab vs Silver Sneakers, along w/ MVI, Fe, VitD2000u/d...  ~  2/15:  She notes persistent symptoms but never  followed recommendations from 10/14; Rec to start exercise program & consider Ativan 0.5mg  Tid... ~  6/15:  Treated for bronchitis by DrJohn w/ ZPak & Pred; CXR 6/15 showed borderline cardiomeg, chronic changes and sl incr markings at bases; supposed to be on Advair250-Bid...  HYPERTENSION (ICD-401.9) >> VALVULAR HEART DISEASE >> with mod AI & TR, severe MR & LA dil found on 2DEcho 6/15 w/ severe PulmHTN (PAsys=65) ~  2DEcho 8/91 showed mild MR, trace AI, EF=60%... ~  NuclearStressTest 11/95 was normal...  ~  3/12:  BP= 120/70 & she says similar at home... takes med regularly and tol well... denies HA, fatigue, visual changes, CP, palipit, dizziness, syncope, dyspnea, edema, etc... ~  3/13:  BP= 150/84 & even better at home; she remains asymptomatic... ~  We attempted to incr the Losartan to 100mg /d but she states she can't tolerate a dose that high, went to ER w/ dyspnea, mild hypoxemia, & Creat=1.3; Cozaar reduced back to 50mg  & she states improved. ~  5/13:  BP= 158/72 but she says better at home & doesn't want additional meds... ~  1/14:  on Losartan50; BP= 124/80 & similar at home she says; denies HA, CP, palpit, ch in SOB, edema, etc... ~  [facility] 4/14 showed  hypertensive BP response, no scar or ischemia, normal wall motion, study not gated...  ~  2DEcho 6/14 showed norm wall motion & LVF w/ EF=55-65%, mild AS/ modAI/ mildMR, no ASD or PFO... ~  7/14: on Losartan50; BP= 150/64 but better at home she says; weight is down 23# to 168# today; denies HA, CP, palpit, ch in SOB, edema, etc. ~ 10/14: on Losar50; BP= 152/70 and she is rec to increase to 100mg /d, monitor BP at home... ~  2/15: on Losar100; BP= 150/70 & weight is up a further 6#; we reviewed low sodium weight reducing diet... ~  4/15 to 6/15: on ASA325 & Losar100; she saw DrMcLean for Cards f/u appt> HBP, Valv heart dis, hx carotid stenosis; prev Myoview 4/14 was neg for ischemia or infarction; prev Echo's from 4/14 & 6/14 reviewed (valv  heart dis- modAI&TR, mild-modMR, & PulmHTN); he ordered a repeat 2DEcho to check for valve progression but it wasn't done until 6/15 and revealed norm LV size & function w/ EF=60-65%, modAI, severeMR, modTR, severe LAdil, PAsys-65 => she has f/u appt pending to discuss further eval & rx options (?cath, ?valve surg, etc)...   CEREBROVASCULAR DISEASE (ICD-437.9) - on ASA 81mg /d... she has hx small vessel ischemic dis and has been seen by DrWillis for Neuro w/ hx amaurosis fugax in 2006... prev MRI w/ 36mm intracavernous carotid aneurysm on left... ~  MRI Brain 2006 w/ sm vessel ischemic dis & 54mm intracavernous carotid aneurym- being followed... ~  CDopplers 3/08 showed bilat ICA plaque, 0-39% bilat stenoses (no change from prev). ~  CDopplers 3/09 showed 40-59% right ICA stenosis & 0-39% left ICA stenosis; she was asymptomatic... ~  CDopplers 3/10 showed mod plaque in prox ICAs bilat w/ 28-31% RICA & 5-17% LICA stenoses. ~  6/10: developed transient loss of vision in right eye- ophthal eval by DrMatthews & vasc eval DrLawson w/ repeat CDopplers showing 60-79% RICA stenosis, & 61-60% LICA stenosis ==> right RAE 6/10. ~  CDopplers followed by DrLawson's office since her surg & stable w/ patent right ICA endart site & 73-71% (low end) LICA stenosis felt to be stable & nonprogressive (done 7/11 & 8/12)... ~  on ASA daily; seen 8/13 w/ Carotid Duplex showing patent right CAE w/o restenosis, & 0-39% left ICA stenosis w/ antegrade vertebrals (no change from last yr); she denies cerebral ischemic symptoms... ~  MRA Neck 6/14 showed no signif stenosis, bilat antegrade vertebrals... ~  MRA Brain 6/14 showed no signif stenosis and prev described sm left carvernousICA aneurysm on 2006 scan was NOT seen currently... ~  She continues on ASA325 daily w/o cerebral ischemic symptoms...   HYPOTHYROIDISM (ICD-244.9) - on SYNTHROID 68mcg/d now  GERD (ICD-530.81) - on PRILOSEC 20mg /d... GI=DrWeissman & last EGD 8/06  showed esoph stricture- dilated... rec for Prilosec daily...   DIVERTICULOSIS OF COLON (ICD-562.10) - she is s/p sigmoid colectomy 6/03 by DrHoxworth...   DEGENERATIVE JOINT DISEASE (ICD-715.90) - know CSpine DJD w/ osteophytes on prev scan... she is s/p right TKR 1/08 and left TKR 4/14 by DrAlusio...  BACK PAIN, LUMBAR (ICD-724.2) - she takes [place] as needed.  TIA >> presented to ER 6/14 w/ aphasia, drooling, & left facial droop that occurred while off her ASA for knee surg; all symptoms resolved in 55min & she refused hosp; outpt w/u by [facility] Neuro... ~  MRI Brain 6/14 showed chr microvasc ischemic dis w/ generalized atrophy, NAD...  ~ She remains on ASA325mg /d w/o cerebral ischemic symptoms...  ANXIETY (ICD-300.00)  DEPRESSION (ICD-311) - taking Lexapro10mg /d...   Past Surgical History  Procedure Laterality Date  . Vaginal hysterectomy    . Sigmoid colectomy    . Tubal ligation    . Replacement total knee      right and left knee  . Cardiothoracic procedure  01/13/2009    Right   . Right foot surgery       nerve cut between toes   . Endartarectomy      right carotid endartarectomy - 2010  . Total knee arthroplasty Left 11/17/2012    Procedure: TOTAL KNEE ARTHROPLASTY;  Surgeon: Gearlean Alf, MD;  Location: [facility];  Service: Orthopedics;  Laterality: Left;    Outpatient Encounter Prescriptions as of 01/19/2014  Medication Sig  . aspirin EC 325 MG tablet Take 1 tablet (325 mg total) by mouth daily.  . Cholecalciferol (VITAMIN D) 2000 UNITS CAPS Take 1 capsule by mouth daily.  . Cranberry-Vitamin C-Probiotic (AZO CRANBERRY) 250-30 MG TABS Take 1 capsule by mouth daily.  Marland Kitchen escitalopram (LEXAPRO) 10 MG tablet Take 1 tablet (10 mg total) by mouth daily.  . ferrous sulfate 325 (65 FE) MG tablet Take 325 mg by mouth daily with breakfast.  . fish oil-omega-3 fatty acids 1000 MG capsule Take 2 g by mouth daily.  . Fluticasone-Salmeterol (ADVAIR DISKUS) 250-50 MCG/DOSE  AEPB Inhale 1 puff into the lungs 2 (two) times daily.  Marland Kitchen levothyroxine (SYNTHROID, LEVOTHROID) 88 MCG tablet Take 1 tablet (88 mcg total) by mouth daily.  Marland Kitchen losartan (COZAAR) 100 MG tablet Take 1 tablet (100 mg total) by mouth daily.  . naproxen sodium (ANAPROX) 220 MG tablet Take 220 mg by mouth 2 (two) times daily with a meal.  . Nutritional Supplements (OSTEO ADVANCE) TABS Take 1 tablet by mouth daily.  Marland Kitchen omeprazole (PRILOSEC) 20 MG capsule TAKE 1 CAPSULE (20 MG TOTAL) BY MOUTH DAILY.  . simvastatin (ZOCOR) 10 MG tablet Take 1 tablet (10 mg total) by mouth at bedtime.  . [DISCONTINUED] azithromycin (ZITHROMAX Z-PAK) 250 MG tablet Use as directed  . [DISCONTINUED] HYDROcodone-homatropine (HYCODAN) 5-1.5 MG/5ML syrup Take 5 mLs by mouth every 6 (six) hours as needed for cough.  . [DISCONTINUED] Multiple Vitamins-Minerals (WOMENS MULTIVITAMIN PLUS PO) Take 1 tablet by mouth daily.  . [DISCONTINUED] predniSONE (DELTASONE) 10 MG tablet 3 tabs by mouth per day for 3 days,2tabs per day for 3 days,1tab per day for 3 days    Allergies  Allergen Reactions  . Relafen [Nabumetone] Diarrhea and Other (See Comments)    GI / Urinary Bleeding  . Lipitor [Atorvastatin]     Causes memory loss  . Penicillins Hives, Itching, Swelling and Rash    Current Medications, Allergies, Past Medical History, Past Surgical History, Family History, and Social History were reviewed in [employer] record.   Review of Systems        The patient complains of dyspnea on exertion, gas/bloating, urinary hesitancy, nocturia, back pain, joint pain, arthritis, difficulty walking, and depression.  The patient denies fever, chills, sweats, anorexia, fatigue, weakness, malaise, weight loss, sleep disorder, blurring, diplopia, eye irritation, eye discharge, vision loss, eye pain, photophobia, earache, ear discharge, tinnitus, decreased hearing, nasal congestion, nosebleeds, sore throat, hoarseness, chest  pain, palpitations, syncope, orthopnea, PND, peripheral edema, cough, dyspnea at rest, excessive sputum, hemoptysis, wheezing, pleurisy, nausea, vomiting, diarrhea, constipation, change in bowel habits, abdominal pain, melena, hematochezia, jaundice, indigestion/heartburn, dysphagia, odynophagia, dysuria, hematuria, urinary frequency, incontinence, joint swelling, muscle cramps, muscle weakness, stiffness, sciatica, restless legs,  leg pain at night, leg pain with exertion, rash, itching, dryness, suspicious lesions, paralysis, paresthesias, seizures, tremors, vertigo, transient blindness, frequent falls, frequent headaches, anxiety, memory loss, confusion, cold intolerance, heat intolerance, polydipsia, polyphagia, polyuria, unusual weight change, abnormal bruising, bleeding, enlarged lymph nodes, urticaria, allergic rash, hay fever, and recurrent infections.     Objective:   Physical Exam    WD, WN, 79 y/o WF in NAD... GENERAL:  Alert & oriented; pleasant & cooperative... HEENT:  [place]/AT, EOM-wnl, left marcus-gunn pupil, EACs-clear, TMs-wnl, NOSE-clear, THROAT-clear & wnl. NECK:  Supple w/ fairROM; no JVD; normal carotid impulses w/ faint right bruit; no thyromegaly or nodules palpated; no lymphadenopathy. CHEST:  Clear to P & A; without wheezes/ rales/ or rhonchi heard... HEART:  Regular Rhythm; without murmurs/ rubs/ or gallops detected... ABDOMEN:  Soft & nontender; normal bowel sounds; no organomegaly or masses palpated... EXT: without deformities, mild arthritic changes; no varicose veins/ +venous insuffic/ no edema. NEURO:  no focal neuro deficits x part loss of vision left eye... DERM:  No lesions noted; no rash etc...  RADIOLOGY DATA:  Reviewed in the EPIC EMR & discussed w/ the patient...  LABORATORY DATA:  Reviewed in the EPIC EMR & discussed w/ the patient...     Assessment:      DYSPNEA> no real improvement after Advair, PulmRehab started, Iron tabs for her anemia, etc;  Repeat  2DEcho w/ severe MR and PulmHTN w/ PAsys=65;  She has f/u w/ DrMcLean soon to review results and discuss options for further eval & rx...    HBP>  Fair control on Losartan 100 & we reviewed diet, wt reduction & low sodium...  Cerebrovasc Dis>  Followed by [facility] & stable s/p right CAE in 2010; continue ASA & VVS follow up...  CHOL>  She refused the Lipitor believing it caused some confusion; [facility] convinced her to try Cres10 but [facility] changed her to Simva20-1/2 tab daily...  Hypothyroid>  Synthroid dose decr by DrJohn w/ TSH=0.19  GERD>  Stable on PPI Rx daily; continue Prilosec...  Divertics>  S/p sigmoid colectomy 2003; she is in need of GI f/u & 5yr colonoscopy- refer to [place] (she still hasn't set this up)...  DJD>  S/p bilat TKRs w/ known back pain & bursitis right hip- s/p shot per DrAlusio...  TIA>  She is stable back on her ASA, now 325mg /d w/ f/u [facility]...  Anxiety/ Depression>  Not on meds despite her considerable stress, and she feels she is doing satis & doesn't want med rx...     Plan:     Patient's Medications  New Prescriptions   No medications on file  Previous Medications   ASPIRIN EC 325 MG TABLET    Take 1 tablet (325 mg total) by mouth daily.   CHOLECALCIFEROL (VITAMIN D) 2000 UNITS CAPS    Take 1 capsule by mouth daily.   CRANBERRY-VITAMIN C-PROBIOTIC (AZO CRANBERRY) 250-30 MG TABS    Take 1 capsule by mouth daily.   ESCITALOPRAM (LEXAPRO) 10 MG TABLET    Take 1 tablet (10 mg total) by mouth daily.   FERROUS SULFATE 325 (65 FE) MG TABLET    Take 325 mg by mouth daily with breakfast.   FISH OIL-OMEGA-3 FATTY ACIDS 1000 MG CAPSULE    Take 2 g by mouth daily.   FLUTICASONE-SALMETEROL (ADVAIR DISKUS) 250-50 MCG/DOSE AEPB    Inhale 1 puff into the lungs 2 (two) times daily.   LEVOTHYROXINE (SYNTHROID, LEVOTHROID) 88 MCG TABLET    Take  1 tablet (88 mcg total) by mouth daily.   LOSARTAN (COZAAR) 100 MG TABLET    Take 1 tablet (100 mg total) by  mouth daily.   NAPROXEN SODIUM (ANAPROX) 220 MG TABLET    Take 220 mg by mouth 2 (two) times daily with a meal.   NUTRITIONAL SUPPLEMENTS (OSTEO ADVANCE) TABS    Take 1 tablet by mouth daily.   OMEPRAZOLE (PRILOSEC) 20 MG CAPSULE    TAKE 1 CAPSULE (20 MG TOTAL) BY MOUTH DAILY.   SIMVASTATIN (ZOCOR) 10 MG TABLET    Take 1 tablet (10 mg total) by mouth at bedtime.  Modified Medications   No medications on file  Discontinued Medications   AZITHROMYCIN (ZITHROMAX Z-PAK) 250 MG TABLET    Use as directed   HYDROCODONE-HOMATROPINE (HYCODAN) 5-1.5 MG/5ML SYRUP    Take 5 mLs by mouth every 6 (six) hours as needed for cough.   MULTIPLE VITAMINS-MINERALS (WOMENS MULTIVITAMIN PLUS PO)    Take 1 tablet by mouth daily.   PREDNISONE (DELTASONE) 10 MG TABLET    3 tabs by mouth per day for 3 days,2tabs per day for 3 days,1tab per day for 3 days

## 2014-01-21 ENCOUNTER — Encounter (HOSPITAL_COMMUNITY): Payer: Medicare Other

## 2014-01-26 ENCOUNTER — Encounter: Payer: Self-pay | Admitting: *Deleted

## 2014-01-26 ENCOUNTER — Encounter: Payer: Self-pay | Admitting: Cardiology

## 2014-01-26 ENCOUNTER — Encounter (HOSPITAL_COMMUNITY): Payer: Medicare Other

## 2014-01-26 ENCOUNTER — Ambulatory Visit (INDEPENDENT_AMBULATORY_CARE_PROVIDER_SITE_OTHER): Payer: Medicare Other | Admitting: Cardiology

## 2014-01-26 VITALS — BP 159/70 | HR 78 | Ht 65.0 in | Wt 186.0 lb

## 2014-01-26 DIAGNOSIS — R0600 Dyspnea, unspecified: Secondary | ICD-10-CM

## 2014-01-26 DIAGNOSIS — I10 Essential (primary) hypertension: Secondary | ICD-10-CM

## 2014-01-26 DIAGNOSIS — I340 Nonrheumatic mitral (valve) insufficiency: Secondary | ICD-10-CM

## 2014-01-26 DIAGNOSIS — R0989 Other specified symptoms and signs involving the circulatory and respiratory systems: Secondary | ICD-10-CM

## 2014-01-26 DIAGNOSIS — E785 Hyperlipidemia, unspecified: Secondary | ICD-10-CM | POA: Diagnosis not present

## 2014-01-26 DIAGNOSIS — I6529 Occlusion and stenosis of unspecified carotid artery: Secondary | ICD-10-CM | POA: Diagnosis not present

## 2014-01-26 DIAGNOSIS — I059 Rheumatic mitral valve disease, unspecified: Secondary | ICD-10-CM

## 2014-01-26 DIAGNOSIS — J45909 Unspecified asthma, uncomplicated: Secondary | ICD-10-CM

## 2014-01-26 DIAGNOSIS — I38 Endocarditis, valve unspecified: Secondary | ICD-10-CM

## 2014-01-26 DIAGNOSIS — R0609 Other forms of dyspnea: Secondary | ICD-10-CM

## 2014-01-26 MED ORDER — ASPIRIN EC 81 MG PO TBEC
81.0000 mg | DELAYED_RELEASE_TABLET | Freq: Every day | ORAL | Status: DC
Start: 2014-01-26 — End: 2016-10-04

## 2014-01-26 MED ORDER — FUROSEMIDE 20 MG PO TABS: 20.0000 mg | ORAL_TABLET | Freq: Every day | ORAL | Status: DC

## 2014-01-26 MED ORDER — POTASSIUM CHLORIDE CRYS ER 20 MEQ PO TBCR: 20.0000 meq | EXTENDED_RELEASE_TABLET | Freq: Every day | ORAL | Status: DC

## 2014-01-26 NOTE — Patient Instructions (Signed)
Decrease aspirin to 81mg  daily.   Start lasix (furosemide) 20mg  daily.  Start KCL(potassium) 20 mEq daily.   Your physician recommends that you return for lab work in: about 10 days--BMET/CBCD/PT/INR.  Your physician has requested that you have a TEE. During a TEE, sound waves are used to create images of your heart. It provides your doctor with information about the size and shape of your heart and how well your heart's chambers and valves are working. In this test, a transducer is attached to the end of a flexible tube that's guided down your throat and into your esophagus (the tube leading from you mouth to your stomach) to get a more detailed image of your heart. You are not awake for the procedure. Please see the instruction sheet given to you today. For further information please visit [email or web address]. Monday July 13,2015   Your physician has requested that you have a cardiac catheterization. Cardiac catheterization is used to diagnose and/or treat various heart conditions. Doctors may recommend this procedure for a number of different reasons. The most common reason is to evaluate chest pain. Chest pain can be a symptom of coronary artery disease (CAD), and cardiac catheterization can show whether plaque is narrowing or blocking your heart's arteries. This procedure is also used to evaluate the valves, as well as measure the blood flow and oxygen levels in different parts of your heart. For further information please visit [email or web address]. Please follow instruction sheet, as given. Monday July 13,2015  Your physician recommends that you schedule a follow-up appointment in: 1 month with Dr Aundra Dubin.

## 2014-01-26 NOTE — Progress Notes (Signed)
Patient ID: [name], female   DOB: 1934-11-06, 79 y.o.   MRN: [id number] PCP: Dr. Jenny Reichmann Pulmonology: Dr. Lenna Gilford  79 yo with long history of asthma with flares as well as carotid stenosis s/p CEA presents for cardiology followup.  She had a Lexiscan Cardiolite in 4/14 with no ischemia or infarction.  She has been doing pulmonary rehab for her chronic asthma.  This has not helped much.   For the last three months, her exertional dyspnea has been worse.  She is short of breath after walking 20 yards.  No orthopnea or PND.  She is very limited.  She has had minimal wheezing recently.  No chest pain.  I had her get an echocardiogram after last appointment (6/15).  This showed EF 60-65%, moderate AI, severe MR, moderate TR with PA systolic pressure 65 mmHg.  This is a progression of valvular disease compared to the prior echo.    Labs (3/13): LDL 103, HDL 91 Labs (4/13): K 4.6, creatinine 1.29 Labs (10/14): K 4.4, creatinine 1.3, LDL 66, HDL 76 Labs (6/15): K 3.9, creatinine 1.1, LDL 103, HDL 77  PMH: 1. Carotid stenosis: Amaurosis fugax in 2010, then right CEA in 6/10 (follows with Kellie Simmering).  2. Hypothyroidism 3. GERD 4. H/o diverticulitis with sigmoid colectomy in 2003.   5. OA with right TKR in 2008.  6. Asthma with flares 7. HTN 8. Dyspnea: Negative stress test in 1995. Lexiscan Cardiolite (4/14) with no ischemia or infarction.   9.  Valvular heart disease: Echo (6/14) with EF 55-60%, mild LVH, very mild AS, moderate AI, mild MR, moderate TR.  Echo (6/15) with EF 60-65%, moderate AI, severe MR, moderate TR, PA systolic pressure 65 mmHg.  10. Depression  SH: Married, never smoked, retired from [employer]  FH: Father with CABG in his 31s, mother with CAD.  Mother with rheumatic heart disease, had valve replacement.   ROS: All systems reviewed and negative except as per HPI.   Current Outpatient Prescriptions  Medication Sig Dispense Refill  . Cholecalciferol (VITAMIN D) 2000 UNITS CAPS  Take 1 capsule by mouth daily.      . Cranberry-Vitamin C-Probiotic (AZO CRANBERRY) 250-30 MG TABS Take 1 capsule by mouth daily.      Marland Kitchen escitalopram (LEXAPRO) 10 MG tablet Take 1 tablet (10 mg total) by mouth daily.  90 tablet  3  . ferrous sulfate 325 (65 FE) MG tablet Take 325 mg by mouth daily with breakfast.      . fish oil-omega-3 fatty acids 1000 MG capsule Take 2 g by mouth daily.      . Fluticasone-Salmeterol (ADVAIR DISKUS) 250-50 MCG/DOSE AEPB Inhale 1 puff into the lungs 2 (two) times daily.  3 each  3  . levothyroxine (SYNTHROID, LEVOTHROID) 88 MCG tablet Take 1 tablet (88 mcg total) by mouth daily.  90 tablet  3  . losartan (COZAAR) 100 MG tablet Take 1 tablet (100 mg total) by mouth daily.  90 tablet  1  . naproxen sodium (ANAPROX) 220 MG tablet Take 440 mg by mouth daily.       . Nutritional Supplements (OSTEO ADVANCE) TABS Take 2 tablets by mouth daily.       . simvastatin (ZOCOR) 10 MG tablet Take 1 tablet (10 mg total) by mouth at bedtime.  90 tablet  3  . albuterol (PROVENTIL HFA;VENTOLIN HFA) 108 (90 BASE) MCG/ACT inhaler Inhale 2 puffs into the lungs every 6 (six) hours as needed for wheezing or shortness of breath.      Marland Kitchen  aspirin EC 81 MG tablet Take 1 tablet (81 mg total) by mouth daily.  90 tablet  3  . furosemide (LASIX) 20 MG tablet Take 1 tablet (20 mg total) by mouth daily.  30 tablet  3  . omeprazole (PRILOSEC) 20 MG capsule Take 20 mg by mouth daily.      . potassium chloride SA (K-DUR,KLOR-CON) 20 MEQ tablet Take 1 tablet (20 mEq total) by mouth daily.  30 tablet  3   Current Facility-Administered Medications  Medication Dose Route Frequency Provider Last Rate Last Dose  . pneumococcal 13-valent conjugate vaccine (PREVNAR 13) injection 0.5 mL  0.5 mL Intramuscular Once Biagio Borg, MD       BP 159/70  Pulse 78  Ht 5\' 5"  (1.651 m)  Wt 186 lb (84.369 kg)  BMI 30.95 kg/m2 General: NAD, overweight Neck: No JVD, no thyromegaly or thyroid nodule.  Lungs: Clear  but somewhat prolonged expiratory phase.  CV: Nondisplaced PMI.  Heart regular S1/S2, no S3/S4, 2/6 HSM LLSB, apex.  No peripheral edema.  No carotid bruit.  Normal pedal pulses.  Abdomen: Soft, nontender, no hepatosplenomegaly, no distention.  Skin: Intact without lesions or rashes.  Neurologic: Alert and oriented x 3.  Psych: Normal affect. Extremities: No clubbing or cyanosis.   Assessment/Plan: 1. Exertional dyspnea: Asthma seems stable and she has not been wheezing much.  Her symptoms are significantly increased over the last few months, now NYHA class III.  Her echo in 6/15 showed significant worsening of valvular disease, now with severe MR, moderate AI, moderate TR, and at least moderate pulmonary hypertension.  I am concerned that valvular disease is the driver of her dyspnea.  I am unsure about the underlying valvular pathology, ?rheumatic heart disease.  - I will arrange TEE to more closely assess her valves.  - I will arrange LHC/RHC on the same day.  If TEE shows significant valvular disease, LHC/RHC would be needed prior to any surgery.  If valvular disease is not as severe as thought by TTE, then LHC/RHC will still be appropriate for diagnosis of worsening exertional symptoms with no explanation. ] - I will start her on Lasix 20 mg daily with KCl 20 mEq daily.  BMET in 10 days.  2. Hyperlipidemia: She has known vascular disease (carotid stenosis).  Continue statin.  Good lipids in 10/14.   3. HTN: BP high today but nervous.  Has been normal at pulmonary rehab and prior appointments.   4. Carotid stenosis s/p CEA: OK to decrease ASA to 81 mg daily.  Continue statin.  Followed by [facility].   Followup 1 month.   [name] 01/26/2014

## 2014-01-27 ENCOUNTER — Other Ambulatory Visit (HOSPITAL_COMMUNITY): Payer: Medicare Other

## 2014-01-27 ENCOUNTER — Ambulatory Visit: Payer: Medicare Other | Admitting: Family

## 2014-01-28 ENCOUNTER — Encounter (HOSPITAL_COMMUNITY): Payer: Medicare Other

## 2014-02-01 ENCOUNTER — Telehealth (HOSPITAL_COMMUNITY): Payer: Self-pay

## 2014-02-01 ENCOUNTER — Telehealth: Payer: Self-pay | Admitting: Cardiology

## 2014-02-01 NOTE — Telephone Encounter (Signed)
Pt would like to know if she can stop going to Pulmonary rehab because she is getting weak and weaker. Pt was made aware that on 01/19/14 Pulmonary medicine recommended for pt to hold off on pulmonary rehab until she completed the cardianc evaluation. Pt is scheduled for cardiac cath on 7/13 pt is aware.

## 2014-02-01 NOTE — Telephone Encounter (Signed)
New message     Should pt continue to go to pulm rehab even though she is getting weaker and weaker

## 2014-02-02 ENCOUNTER — Encounter (HOSPITAL_COMMUNITY): Payer: Medicare Other

## 2014-02-04 ENCOUNTER — Encounter (HOSPITAL_COMMUNITY): Payer: Medicare Other

## 2014-02-05 ENCOUNTER — Other Ambulatory Visit (INDEPENDENT_AMBULATORY_CARE_PROVIDER_SITE_OTHER): Payer: Medicare Other

## 2014-02-05 ENCOUNTER — Other Ambulatory Visit: Payer: Self-pay | Admitting: *Deleted

## 2014-02-05 DIAGNOSIS — I059 Rheumatic mitral valve disease, unspecified: Secondary | ICD-10-CM | POA: Diagnosis not present

## 2014-02-05 DIAGNOSIS — R0600 Dyspnea, unspecified: Secondary | ICD-10-CM

## 2014-02-05 DIAGNOSIS — I340 Nonrheumatic mitral (valve) insufficiency: Secondary | ICD-10-CM

## 2014-02-05 LAB — CBC WITH DIFFERENTIAL/PLATELET
Basophils Absolute: 0 10*3/uL (ref 0.0–0.1)
Basophils Relative: 0.6 % (ref 0.0–3.0)
EOS PCT: 3.9 % (ref 0.0–5.0)
Eosinophils Absolute: 0.2 10*3/uL (ref 0.0–0.7)
HCT: 34.6 % — ABNORMAL LOW (ref 36.0–46.0)
Hemoglobin: 11.7 g/dL — ABNORMAL LOW (ref 12.0–15.0)
Lymphocytes Relative: 33.9 % (ref 12.0–46.0)
Lymphs Abs: 1.8 10*3/uL (ref 0.7–4.0)
MCHC: 33.7 g/dL (ref 30.0–36.0)
MCV: 99.6 fl (ref 78.0–100.0)
MONOS PCT: 7.6 % (ref 3.0–12.0)
Monocytes Absolute: 0.4 10*3/uL (ref 0.1–1.0)
Neutro Abs: 2.8 10*3/uL (ref 1.4–7.7)
Neutrophils Relative %: 54 % (ref 43.0–77.0)
PLATELETS: 264 10*3/uL (ref 150.0–400.0)
RBC: 3.48 Mil/uL — ABNORMAL LOW (ref 3.87–5.11)
RDW: 14.1 % (ref 11.5–15.5)
WBC: 5.3 10*3/uL (ref 4.0–10.5)

## 2014-02-05 LAB — BASIC METABOLIC PANEL
BUN: 51 mg/dL — AB (ref 6–23)
CO2: 24 mEq/L (ref 19–32)
CREATININE: 2 mg/dL — AB (ref 0.4–1.2)
Calcium: 10 mg/dL (ref 8.4–10.5)
Chloride: 103 mEq/L (ref 96–112)
GFR: 25.42 mL/min — AB (ref >60.00)
Glucose, Bld: 112 mg/dL — ABNORMAL HIGH (ref 70–99)
Potassium: 5 mEq/L (ref 3.5–5.1)
Sodium: 136 mEq/L (ref 135–145)

## 2014-02-05 LAB — PROTIME-INR
INR: 1 ratio (ref 0.8–1.0)
PROTHROMBIN TIME: 11.1 s (ref 9.6–13.1)

## 2014-02-08 ENCOUNTER — Telehealth: Payer: Self-pay | Admitting: *Deleted

## 2014-02-08 ENCOUNTER — Encounter (HOSPITAL_COMMUNITY)
Admission: RE | Disposition: A | Discharge status: Home / Self Care | Payer: Self-pay | Source: Ambulatory Visit | Attending: Cardiology

## 2014-02-08 ENCOUNTER — Ambulatory Visit (HOSPITAL_COMMUNITY)
Admission: RE | Admit: 2014-02-08 | Discharge: 2014-02-08 | Disposition: A | Discharge status: Home / Self Care | Payer: Medicare Other | Source: Ambulatory Visit | Attending: Cardiology | Admitting: Cardiology

## 2014-02-08 ENCOUNTER — Encounter (HOSPITAL_COMMUNITY): Payer: Self-pay | Admitting: Gastroenterology

## 2014-02-08 ENCOUNTER — Encounter (HOSPITAL_COMMUNITY)
Admission: RE | Disposition: A | Discharge status: Home / Self Care | Payer: Medicare Other | Source: Ambulatory Visit | Attending: Cardiology

## 2014-02-08 DIAGNOSIS — R0989 Other specified symptoms and signs involving the circulatory and respiratory systems: Secondary | ICD-10-CM | POA: Insufficient documentation

## 2014-02-08 DIAGNOSIS — F3289 Other specified depressive episodes: Secondary | ICD-10-CM | POA: Insufficient documentation

## 2014-02-08 DIAGNOSIS — J45909 Unspecified asthma, uncomplicated: Secondary | ICD-10-CM | POA: Diagnosis not present

## 2014-02-08 DIAGNOSIS — I359 Nonrheumatic aortic valve disorder, unspecified: Secondary | ICD-10-CM | POA: Insufficient documentation

## 2014-02-08 DIAGNOSIS — R5381 Other malaise: Secondary | ICD-10-CM | POA: Diagnosis not present

## 2014-02-08 DIAGNOSIS — R5383 Other fatigue: Secondary | ICD-10-CM

## 2014-02-08 DIAGNOSIS — K219 Gastro-esophageal reflux disease without esophagitis: Secondary | ICD-10-CM | POA: Diagnosis not present

## 2014-02-08 DIAGNOSIS — I10 Essential (primary) hypertension: Secondary | ICD-10-CM | POA: Diagnosis not present

## 2014-02-08 DIAGNOSIS — F329 Major depressive disorder, single episode, unspecified: Secondary | ICD-10-CM | POA: Diagnosis not present

## 2014-02-08 DIAGNOSIS — Z96659 Presence of unspecified artificial knee joint: Secondary | ICD-10-CM | POA: Insufficient documentation

## 2014-02-08 DIAGNOSIS — E039 Hypothyroidism, unspecified: Secondary | ICD-10-CM | POA: Diagnosis not present

## 2014-02-08 DIAGNOSIS — I059 Rheumatic mitral valve disease, unspecified: Secondary | ICD-10-CM | POA: Diagnosis not present

## 2014-02-08 DIAGNOSIS — R0609 Other forms of dyspnea: Secondary | ICD-10-CM | POA: Diagnosis not present

## 2014-02-08 DIAGNOSIS — Z7982 Long term (current) use of aspirin: Secondary | ICD-10-CM | POA: Diagnosis not present

## 2014-02-08 DIAGNOSIS — I079 Rheumatic tricuspid valve disease, unspecified: Secondary | ICD-10-CM | POA: Diagnosis not present

## 2014-02-08 HISTORY — PX: TEE WITHOUT CARDIOVERSION: SHX5443

## 2014-02-08 LAB — BASIC METABOLIC PANEL
ANION GAP: 13 (ref 5–15)
BUN: 55 mg/dL — ABNORMAL HIGH (ref 6–23)
CHLORIDE: 108 meq/L (ref 96–112)
CO2: 20 mEq/L (ref 19–32)
Calcium: 8.4 mg/dL (ref 8.4–10.5)
Creatinine, Ser: 1.73 mg/dL — ABNORMAL HIGH (ref 0.50–1.10)
GFR calc non Af Amer: 27 mL/min — ABNORMAL LOW (ref >90)
GFR, EST AFRICAN AMERICAN: 32 mL/min — AB (ref >90)
Glucose, Bld: 105 mg/dL — ABNORMAL HIGH (ref 70–99)
POTASSIUM: 4.5 meq/L (ref 3.7–5.3)
SODIUM: 141 meq/L (ref 137–147)

## 2014-02-08 LAB — CBC
HCT: 30.8 % — ABNORMAL LOW (ref 36.0–46.0)
HEMOGLOBIN: 10.3 g/dL — AB (ref 12.0–15.0)
MCH: 32.7 pg (ref 26.0–34.0)
MCHC: 33.4 g/dL (ref 30.0–36.0)
MCV: 97.8 fL (ref 78.0–100.0)
Platelets: 214 10*3/uL (ref 150–400)
RBC: 3.15 MIL/uL — ABNORMAL LOW (ref 3.87–5.11)
RDW: 13.1 % (ref 11.5–15.5)
WBC: 5.1 10*3/uL (ref 4.0–10.5)

## 2014-02-08 SURGERY — ECHOCARDIOGRAM, TRANSESOPHAGEAL
Anesthesia: Moderate Sedation

## 2014-02-08 SURGERY — LEFT AND RIGHT HEART CATHETERIZATION WITH CORONARY ANGIOGRAM
Anesthesia: LOCAL

## 2014-02-08 MED ORDER — MIDAZOLAM HCL 10 MG/2ML IJ SOLN
INTRAMUSCULAR | Status: DC | PRN
  Administered 2014-02-08: 2 mg via INTRAVENOUS

## 2014-02-08 MED ORDER — SODIUM CHLORIDE 0.9 % IV SOLN
INTRAVENOUS | Status: DC
  Administered 2014-02-08: 500 mL via INTRAVENOUS

## 2014-02-08 MED ORDER — FENTANYL CITRATE 0.05 MG/ML IJ SOLN
INTRAMUSCULAR | Status: DC | PRN
  Administered 2014-02-08: 25 ug via INTRAVENOUS

## 2014-02-08 MED ORDER — FENTANYL CITRATE 0.05 MG/ML IJ SOLN
INTRAMUSCULAR | Status: AC
  Filled 2014-02-08: qty 2

## 2014-02-08 MED ORDER — SODIUM CHLORIDE 0.9 % IV SOLN: INTRAVENOUS | Status: DC

## 2014-02-08 MED ORDER — MIDAZOLAM HCL 5 MG/ML IJ SOLN
INTRAMUSCULAR | Status: AC
  Filled 2014-02-08: qty 1

## 2014-02-08 MED ORDER — AMLODIPINE BESYLATE 2.5 MG PO TABS: 2.5000 mg | ORAL_TABLET | Freq: Every day | ORAL | Status: DC

## 2014-02-08 NOTE — H&P (View-Only) (Signed)
Patient ID: [name], female   DOB: 1935-02-28, 79 y.o.   MRN: [id number] PCP: Dr. Jenny [name] Pulmonology: Dr. Lenna [name]  79 yo with long history of asthma with flares as well as carotid stenosis s/p CEA presents for cardiology followup.  She had a Lexiscan Cardiolite in 4/14 with no ischemia or infarction.  She has been doing pulmonary rehab for her chronic asthma.  This has not helped much.   For the last three months, her exertional dyspnea has been worse.  She is short of breath after walking 20 yards.  No orthopnea or PND.  She is very limited.  She has had minimal wheezing recently.  No chest pain.  I had her get an echocardiogram after last appointment (6/15).  This showed EF 60-65%, moderate AI, severe MR, moderate TR with PA systolic pressure 65 mmHg.  This is a progression of valvular disease compared to the prior echo.    Labs (3/13): LDL 103, HDL 91 Labs (4/13): K 4.6, creatinine 1.29 Labs (10/14): K 4.4, creatinine 1.3, LDL 66, HDL 76 Labs (6/15): K 3.9, creatinine 1.1, LDL 103, HDL 77  PMH: 1. Carotid stenosis: Amaurosis fugax in 2010, then right CEA in 6/10 (follows with [name]).  2. Hypothyroidism 3. GERD 4. H/o diverticulitis with sigmoid colectomy in 2003.   5. OA with right TKR in 2008.  6. Asthma with flares 7. HTN 8. Dyspnea: Negative stress test in 1995. Lexiscan Cardiolite (4/14) with no ischemia or infarction.   9.  Valvular heart disease: Echo (6/14) with EF 55-60%, mild LVH, very mild AS, moderate AI, mild MR, moderate TR.  Echo (6/15) with EF 60-65%, moderate AI, severe MR, moderate TR, PA systolic pressure 65 mmHg.  10. Depression  SH: Married, never smoked, retired from [employer]  FH: Father with CABG in his 67s, mother with CAD.  Mother with rheumatic heart disease, had valve replacement.   ROS: All systems reviewed and negative except as per HPI.   Current Outpatient Prescriptions  Medication Sig Dispense Refill  . Cholecalciferol (VITAMIN D) 2000 UNITS CAPS  Take 1 capsule by mouth daily.      . Cranberry-Vitamin C-Probiotic (AZO CRANBERRY) 250-30 MG TABS Take 1 capsule by mouth daily.      Marland Kitchen escitalopram (LEXAPRO) 10 MG tablet Take 1 tablet (10 mg total) by mouth daily.  90 tablet  3  . ferrous sulfate 325 (65 FE) MG tablet Take 325 mg by mouth daily with breakfast.      . fish oil-omega-3 fatty acids 1000 MG capsule Take 2 g by mouth daily.      . Fluticasone-Salmeterol (ADVAIR DISKUS) 250-50 MCG/DOSE AEPB Inhale 1 puff into the lungs 2 (two) times daily.  3 each  3  . levothyroxine (SYNTHROID, LEVOTHROID) 88 MCG tablet Take 1 tablet (88 mcg total) by mouth daily.  90 tablet  3  . losartan (COZAAR) 100 MG tablet Take 1 tablet (100 mg total) by mouth daily.  90 tablet  1  . naproxen sodium (ANAPROX) 220 MG tablet Take 440 mg by mouth daily.       . Nutritional Supplements (OSTEO ADVANCE) TABS Take 2 tablets by mouth daily.       . simvastatin (ZOCOR) 10 MG tablet Take 1 tablet (10 mg total) by mouth at bedtime.  90 tablet  3  . albuterol (PROVENTIL HFA;VENTOLIN HFA) 108 (90 BASE) MCG/ACT inhaler Inhale 2 puffs into the lungs every 6 (six) hours as needed for wheezing or shortness of breath.      Marland Kitchen  aspirin EC 81 MG tablet Take 1 tablet (81 mg total) by mouth daily.  90 tablet  3  . furosemide (LASIX) 20 MG tablet Take 1 tablet (20 mg total) by mouth daily.  30 tablet  3  . omeprazole (PRILOSEC) 20 MG capsule Take 20 mg by mouth daily.      . potassium chloride SA (K-DUR,KLOR-CON) 20 MEQ tablet Take 1 tablet (20 mEq total) by mouth daily.  30 tablet  3   Current Facility-Administered Medications  Medication Dose Route Frequency Provider Last Rate Last Dose  . pneumococcal 13-valent conjugate vaccine (PREVNAR 13) injection 0.5 mL  0.5 mL Intramuscular Once Angel Borg, MD       BP 159/70  Pulse 78  Ht 5\' 5"  (1.651 m)  Wt 186 lb (84.369 kg)  BMI 30.95 kg/m2 General: NAD, overweight Neck: No JVD, no thyromegaly or thyroid nodule.  Lungs: Clear  but somewhat prolonged expiratory phase.  CV: Nondisplaced PMI.  Heart regular S1/S2, no S3/S4, 2/6 HSM LLSB, apex.  No peripheral edema.  No carotid bruit.  Normal pedal pulses.  Abdomen: Soft, nontender, no hepatosplenomegaly, no distention.  Skin: Intact without lesions or rashes.  Neurologic: Alert and oriented x 3.  Psych: Normal affect. Extremities: No clubbing or cyanosis.   Assessment/Plan: 1. Exertional dyspnea: Asthma seems stable and she has not been wheezing much.  Her symptoms are significantly increased over the last few months, now NYHA class III.  Her echo in 6/15 showed significant worsening of valvular disease, now with severe MR, moderate AI, moderate TR, and at least moderate pulmonary hypertension.  I am concerned that valvular disease is the driver of her dyspnea.  I am unsure about the underlying valvular pathology, ?rheumatic heart disease.  - I will arrange TEE to more closely assess her valves.  - I will arrange LHC/RHC on the same day.  If TEE shows significant valvular disease, LHC/RHC would be needed prior to any surgery.  If valvular disease is not as severe as thought by TTE, then LHC/RHC will still be appropriate for diagnosis of worsening exertional symptoms with no explanation. ] - I will start her on Lasix 20 mg daily with KCl 20 mEq daily.  BMET in 10 days.  2. Hyperlipidemia: She has known vascular disease (carotid stenosis).  Continue statin.  Good lipids in 10/14.   3. HTN: BP high today but nervous.  Has been normal at pulmonary rehab and prior appointments.   4. Carotid stenosis s/p CEA: OK to decrease ASA to 81 mg daily.  Continue statin.  Followed by [facility].   Followup 1 month.   [name] 01/26/2014

## 2014-02-08 NOTE — Interval H&P Note (Signed)
History and Physical Interval Note:  02/08/2014 9:25 AM  [name]  has presented today for surgery, with the diagnosis of MITRAL REGURGITIATION  The various methods of treatment have been discussed with the patient and family. After consideration of risks, benefits and other options for treatment, the patient has consented to  Procedure(s): TRANSESOPHAGEAL ECHOCARDIOGRAM (TEE) (N/A) as a surgical intervention .  The patient's history has been reviewed, patient examined, no change in status, stable for surgery.  I have reviewed the patient's chart and labs.  Questions were answered to the patient's satisfaction.     Thula Stewart [employer]

## 2014-02-08 NOTE — CV Procedure (Signed)
Procedure: TEE  Indication: Severe MR by TTE.   Sedation: Versed 2 mg IV, Fentanyl 25 mcg IV  Findings: Please see echo section for full report.  Normal LV size with mild LV hypertrophy.  EF 55-60%.  Normal RV size and systolic function.  Mild tricuspid regurgitation.  Mitral valve was mildly thickened with only mild mitral regurgitation.  There was no systolic flow reversal in the pulmonary vein doppler pattern.  There was moderate aortic insufficiency with trileaflet aortic valve.  Peak RV-RA gradient 33 mmHg suggesting mild pulmonary hypertension.   Impression: Valvular disease not particularly marked.  MR and TR appear mild, AI is moderate but this has not changed from 2014.  This does not explain her profound dyspnea/fatigue.  She has stopped Lasix and we will plan on RHC/LHC next week, given renal function time to recovery.   [name] 02/08/2014 9:52 AM

## 2014-02-08 NOTE — Progress Notes (Signed)
D/c instructions written out on downtime form.Marland KitchenMarland KitchenMarland Kitchen

## 2014-02-08 NOTE — Telephone Encounter (Signed)
Left msg on triage stating hospital md told her to contact pcp to inform him her BP med is too strong for her. Called pt med inform her need a ER follow-up. Made appt for tomorrow @ 6:15pm/lmb

## 2014-02-08 NOTE — Telephone Encounter (Signed)
Notes Recorded by Larey Dresser, MD on 02/08/2014 at 11:30 AM  Creatinine better but still above baseline. Have her cut losartan in half to 50 mg daily and start amlodipine 2.5 mg daily. Drink fluids and repeat BMET on Friday. Would tentatively look for day for RHC/LHC next week.  Pt notified.

## 2014-02-08 NOTE — Progress Notes (Signed)
Dr. Aundra Dubin in, states that the TEE shows less valve involvement than they expected.  Will check [facility] today. Plan for cath next week with d/c of lasix.

## 2014-02-09 ENCOUNTER — Encounter (HOSPITAL_COMMUNITY): Payer: Medicare Other

## 2014-02-09 ENCOUNTER — Encounter: Payer: Self-pay | Admitting: *Deleted

## 2014-02-09 ENCOUNTER — Telehealth: Payer: Self-pay | Admitting: *Deleted

## 2014-02-09 ENCOUNTER — Ambulatory Visit (INDEPENDENT_AMBULATORY_CARE_PROVIDER_SITE_OTHER): Payer: Medicare Other | Admitting: Internal Medicine

## 2014-02-09 ENCOUNTER — Encounter (HOSPITAL_COMMUNITY): Payer: Self-pay | Admitting: Cardiology

## 2014-02-09 VITALS — BP 140/70 | HR 82 | Temp 98.3°F | Wt 177.0 lb

## 2014-02-09 DIAGNOSIS — R5381 Other malaise: Secondary | ICD-10-CM

## 2014-02-09 DIAGNOSIS — E039 Hypothyroidism, unspecified: Secondary | ICD-10-CM | POA: Diagnosis not present

## 2014-02-09 DIAGNOSIS — R0609 Other forms of dyspnea: Secondary | ICD-10-CM

## 2014-02-09 DIAGNOSIS — R634 Abnormal weight loss: Secondary | ICD-10-CM | POA: Diagnosis not present

## 2014-02-09 DIAGNOSIS — J45901 Unspecified asthma with (acute) exacerbation: Secondary | ICD-10-CM

## 2014-02-09 DIAGNOSIS — R5383 Other fatigue: Secondary | ICD-10-CM

## 2014-02-09 DIAGNOSIS — I6529 Occlusion and stenosis of unspecified carotid artery: Secondary | ICD-10-CM | POA: Diagnosis not present

## 2014-02-09 DIAGNOSIS — I059 Rheumatic mitral valve disease, unspecified: Secondary | ICD-10-CM

## 2014-02-09 DIAGNOSIS — J4541 Moderate persistent asthma with (acute) exacerbation: Secondary | ICD-10-CM

## 2014-02-09 DIAGNOSIS — R0989 Other specified symptoms and signs involving the circulatory and respiratory systems: Secondary | ICD-10-CM

## 2014-02-09 MED ORDER — PREDNISONE 10 MG PO TABS: ORAL_TABLET | ORAL | Status: DC

## 2014-02-09 MED ORDER — METHYLPREDNISOLONE ACETATE 80 MG/ML IJ SUSP
80.0000 mg | Freq: Once | INTRAMUSCULAR | Status: AC
  Administered 2014-02-09: 80 mg via INTRAMUSCULAR

## 2014-02-09 NOTE — Patient Instructions (Signed)
You had the steroid shot today  Please take all new medication as prescribed - the prednisone  Please continue all other medications as before, and refills have been done if requested.  Please have the pharmacy call with any other refills you may need.  Please keep your appointments with your specialists as you may have planned  Please go to the LAB in the Basement (turn left off the elevator) for the tests to be done at your convenience  Please return in 6 months, or sooner if needed

## 2014-02-09 NOTE — Telephone Encounter (Signed)
Message copied by Katrine Coho on Tue Feb 09, 2014 11:46 AM ------      Message from: Larey Dresser      Created: Mon Feb 08, 2014  9:53 AM       Webb Silversmith,       Could you reschedule Mrs Berko a LHC/RHC for next week?  May need to coordinate day with Kevan Rosebush since I am covering CHF and have some clinic over there.  We are repeating a BMET today in the endo suite and would like to get one again on Friday of this week if today's is still above baseline.      Thanks.      Dalton  ------

## 2014-02-09 NOTE — Telephone Encounter (Signed)
R and LHC scheduled for 02/18/14 8:30AM, lab scheduled for 02/12/14.  Pt advised.

## 2014-02-09 NOTE — Progress Notes (Signed)
Pre visit review using our clinic review tool, if applicable. No additional management support is needed unless otherwise documented below in the visit note. 

## 2014-02-09 NOTE — Progress Notes (Signed)
Subjective:    Patient ID: [name], female    DOB: Nov 01, 1934, 79 y.o.   MRN: [id number]  HPI  Here to f/u., c/o mild > 1 wk ongoing sob, has known hx of asthma, no CP but is Due for heart cath July 23.    Took the 100 mg losartan today, but will start taking half tomorrow,as per cardiology.  Pt denies chest pain, wheezing, orthopnea, PND, increased LE swelling, palpitations, dizziness or syncope.Does c/o ongoing fatigue, but denies signficant daytime hypersomnolence.  Denies hyper or hypo thyroid symptoms such as voice, skin or hair change.  Also with wt loss 192 to 177 in several months, unclear reason, appetite ok.  Past Medical History  Diagnosis Date  . History of sudden visual loss   . Acute cystitis   . Bronchitis, acute   . Hypertension   . Cerebrovascular disease   . Hypothyroidism   . GERD (gastroesophageal reflux disease)   . Diverticulosis of colon   . DJD (degenerative joint disease)   . Back pain   . Transient ischemic attack   . Anxiety   . Depression   . Asthma   . COPD (chronic obstructive pulmonary disease)   . Shortness of breath     with exertion   . Pneumonia     hx of x 2   . 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 UTI (lower urinary tract infection)     hx of   . Carotid artery occlusion   . Hyperlipidemia   . Aortic stenosis, mild 11/18/2013   Past Surgical History  Procedure Laterality Date  . Vaginal hysterectomy    . Sigmoid colectomy    . Tubal ligation    . Replacement total knee      right and left knee  . Cardiothoracic procedure  01/13/2009    Right   . Right foot surgery       nerve cut between toes   . Endartarectomy      right carotid endartarectomy - 2010  . Total knee arthroplasty Left 11/17/2012    Procedure: TOTAL KNEE ARTHROPLASTY;  Surgeon: Gearlean Alf, MD;  Location: [facility] ORS;  Service: Orthopedics;  Laterality: Left;  . Tee without cardioversion N/A 02/08/2014    Procedure: TRANSESOPHAGEAL ECHOCARDIOGRAM (TEE);   Surgeon: Larey Dresser, MD;  Location: [facility] ENDOSCOPY;  Service: Cardiovascular;  Laterality: N/A;    reports that she has never smoked. She has never used smokeless tobacco. She reports that she does not drink alcohol or use illicit drugs. family history includes Coronary artery disease in her sister; Emphysema in her father; Heart attack in her brother; Heart disease in her mother and sister; Rheumatic fever in her brother. Allergies  Allergen Reactions  . Relafen [Nabumetone] Diarrhea and Other (See Comments)    GI / Urinary Bleeding  . Lipitor [Atorvastatin]     Causes memory loss  . Other     "orvail" unknown  . Penicillins Hives, Itching, Swelling and Rash   Current Outpatient Prescriptions on File Prior to Visit  Medication Sig Dispense Refill  . albuterol (PROVENTIL HFA;VENTOLIN HFA) 108 (90 BASE) MCG/ACT inhaler Inhale 2 puffs into the lungs every 6 (six) hours as needed for wheezing or shortness of breath.      Marland Kitchen amLODipine (NORVASC) 2.5 MG tablet Take 1 tablet (2.5 mg total) by mouth daily.  30 tablet  3  . aspirin EC 81 MG tablet Take 1  tablet (81 mg total) by mouth daily.  90 tablet  3  . Cholecalciferol (VITAMIN D) 2000 UNITS CAPS Take 1 capsule by mouth daily.      . Cranberry-Vitamin C-Probiotic (AZO CRANBERRY) 250-30 MG TABS Take 1 capsule by mouth daily.      Marland Kitchen escitalopram (LEXAPRO) 10 MG tablet Take 1 tablet (10 mg total) by mouth daily.  90 tablet  3  . ferrous sulfate 325 (65 FE) MG tablet Take 325 mg by mouth daily with breakfast.      . fish oil-omega-3 fatty acids 1000 MG capsule Take 2 g by mouth daily.      . Fluticasone-Salmeterol (ADVAIR DISKUS) 250-50 MCG/DOSE AEPB Inhale 1 puff into the lungs 2 (two) times daily.  3 each  3  . levothyroxine (SYNTHROID, LEVOTHROID) 88 MCG tablet Take 1 tablet (88 mcg total) by mouth daily.  90 tablet  3  . losartan (COZAAR) 50 MG tablet Take 1 tablet (50 mg total) by mouth daily.      . Nutritional Supplements (OSTEO ADVANCE)  TABS Take 2 tablets by mouth daily.       Marland Kitchen omeprazole (PRILOSEC) 20 MG capsule Take 20 mg by mouth daily.      . simvastatin (ZOCOR) 10 MG tablet Take 1 tablet (10 mg total) by mouth at bedtime.  90 tablet  3   No current facility-administered medications on file prior to visit.      Review of Systems  Constitutional: Negative for unusual diaphoresis or other sweats  HENT: Negative for ringing in ear Eyes: Negative for double vision or worsening visual disturbance.  Respiratory: Negative for choking and stridor.   Gastrointestinal: Negative for vomiting or other signifcant bowel change Genitourinary: Negative for hematuria or decreased urine volume.  Musculoskeletal: Negative for other MSK pain or swelling Skin: Negative for color change and worsening wound.  Neurological: Negative for tremors and numbness other than noted  Psychiatric/Behavioral: Negative for decreased concentration or agitation other than above       Objective:   Physical Exam BP 140/70  Pulse 82  Temp(Src) 98.3 F (36.8 C) (Oral)  Wt 177 lb (80.287 kg)  SpO2 96% VS noted,  Constitutional: Pt appears well-developed, well-nourished.  HENT: Head: NCAT.  Right Ear: External ear normal.  Left Ear: External ear normal.  Eyes: . Pupils are equal, round, and reactive to light. Conjunctivae and EOM are normal Neck: Normal range of motion. Neck supple.  Cardiovascular: Normal rate and regular rhythm.   Pulmonary/Chest: Effort normal and breath sounds decr bilat, trace wheeze.  Abd:  Soft, NT, ND, + BS Neurological: Pt is alert. Not confused , motor grossly intact Skin: Skin is warm. No rash Psychiatric: Pt behavior is normal. No agitation.     Assessment & Plan:

## 2014-02-11 ENCOUNTER — Encounter (HOSPITAL_COMMUNITY): Admission: RE | Admit: 2014-02-11 | Payer: Medicare Other | Source: Ambulatory Visit

## 2014-02-11 DIAGNOSIS — R531 Weakness: Secondary | ICD-10-CM | POA: Insufficient documentation

## 2014-02-11 DIAGNOSIS — R634 Abnormal weight loss: Secondary | ICD-10-CM | POA: Insufficient documentation

## 2014-02-11 NOTE — Assessment & Plan Note (Signed)
stable overall by history and exam, except for wt loss,  recent data reviewed with pt, and pt to continue medical treatment as before,  to f/u any worsening symptoms or concerns, for TSH

## 2014-02-11 NOTE — Assessment & Plan Note (Signed)
Mild to mod, for depomedrol IM, predpack asd,  to f/u any worsening symptoms or concerns 

## 2014-02-11 NOTE — Assessment & Plan Note (Signed)
Also for SPEP, encouraged po,  to f/u any worsening symptoms or concerns

## 2014-02-11 NOTE — Assessment & Plan Note (Signed)
Exam o/w unrevealing, also to check UA

## 2014-02-12 ENCOUNTER — Other Ambulatory Visit (INDEPENDENT_AMBULATORY_CARE_PROVIDER_SITE_OTHER): Payer: Medicare Other

## 2014-02-12 DIAGNOSIS — I059 Rheumatic mitral valve disease, unspecified: Secondary | ICD-10-CM | POA: Diagnosis not present

## 2014-02-12 DIAGNOSIS — R0609 Other forms of dyspnea: Secondary | ICD-10-CM

## 2014-02-12 DIAGNOSIS — R0989 Other specified symptoms and signs involving the circulatory and respiratory systems: Secondary | ICD-10-CM

## 2014-02-12 LAB — CBC WITH DIFFERENTIAL/PLATELET
Basophils Absolute: 0 10*3/uL (ref 0.0–0.1)
Basophils Relative: 0.3 % (ref 0.0–3.0)
EOS ABS: 0 10*3/uL (ref 0.0–0.7)
Eosinophils Relative: 0 % (ref 0.0–5.0)
HCT: 32.1 % — ABNORMAL LOW (ref 36.0–46.0)
HEMOGLOBIN: 10.8 g/dL — AB (ref 12.0–15.0)
LYMPHS PCT: 22.3 % (ref 12.0–46.0)
Lymphs Abs: 1.7 10*3/uL (ref 0.7–4.0)
MCHC: 33.7 g/dL (ref 30.0–36.0)
MCV: 99.3 fl (ref 78.0–100.0)
MONO ABS: 0.3 10*3/uL (ref 0.1–1.0)
Monocytes Relative: 4.1 % (ref 3.0–12.0)
NEUTROS ABS: 5.5 10*3/uL (ref 1.4–7.7)
Neutrophils Relative %: 73.3 % (ref 43.0–77.0)
Platelets: 207 10*3/uL (ref 150.0–400.0)
RBC: 3.23 Mil/uL — AB (ref 3.87–5.11)
RDW: 13.8 % (ref 11.5–15.5)
WBC: 7.5 10*3/uL (ref 4.0–10.5)

## 2014-02-12 LAB — BASIC METABOLIC PANEL
BUN: 42 mg/dL — ABNORMAL HIGH (ref 6–23)
CHLORIDE: 106 meq/L (ref 96–112)
CO2: 25 mEq/L (ref 19–32)
Calcium: 9.9 mg/dL (ref 8.4–10.5)
Creatinine, Ser: 1.5 mg/dL — ABNORMAL HIGH (ref 0.4–1.2)
GFR: 35.56 mL/min — ABNORMAL LOW (ref >60.00)
Glucose, Bld: 120 mg/dL — ABNORMAL HIGH (ref 70–99)
Potassium: 4.4 mEq/L (ref 3.5–5.1)
SODIUM: 137 meq/L (ref 135–145)

## 2014-02-12 NOTE — Progress Notes (Signed)
Quick Note:  Preliminary report reviewed by triage nurse and sent to MD desk. ______ 

## 2014-02-15 ENCOUNTER — Telehealth (HOSPITAL_COMMUNITY): Payer: Self-pay

## 2014-02-16 ENCOUNTER — Encounter (HOSPITAL_COMMUNITY): Payer: Medicare Other

## 2014-02-16 ENCOUNTER — Encounter (HOSPITAL_COMMUNITY): Payer: Self-pay | Admitting: Pharmacy Technician

## 2014-02-18 ENCOUNTER — Encounter (HOSPITAL_COMMUNITY)
Admission: RE | Admit: 2014-02-18 | Discharge: 2014-02-18 | Disposition: A | Discharge status: Home / Self Care | Payer: Medicare Other | Source: Ambulatory Visit | Attending: Pulmonary Disease | Admitting: Pulmonary Disease

## 2014-02-18 ENCOUNTER — Ambulatory Visit (HOSPITAL_COMMUNITY)
Admission: RE | Admit: 2014-02-18 | Discharge: 2014-02-18 | Disposition: A | Discharge status: Home / Self Care | Payer: Medicare Other | Source: Ambulatory Visit | Attending: Cardiology | Admitting: Cardiology

## 2014-02-18 ENCOUNTER — Telehealth (HOSPITAL_COMMUNITY): Payer: Self-pay

## 2014-02-18 ENCOUNTER — Other Ambulatory Visit (HOSPITAL_COMMUNITY): Payer: Self-pay | Admitting: Cardiology

## 2014-02-18 ENCOUNTER — Encounter (HOSPITAL_COMMUNITY)
Admission: RE | Disposition: A | Discharge status: Home / Self Care | Payer: Self-pay | Source: Ambulatory Visit | Attending: Cardiology

## 2014-02-18 DIAGNOSIS — Z96659 Presence of unspecified artificial knee joint: Secondary | ICD-10-CM | POA: Diagnosis not present

## 2014-02-18 DIAGNOSIS — I10 Essential (primary) hypertension: Secondary | ICD-10-CM | POA: Insufficient documentation

## 2014-02-18 DIAGNOSIS — I359 Nonrheumatic aortic valve disorder, unspecified: Secondary | ICD-10-CM | POA: Insufficient documentation

## 2014-02-18 DIAGNOSIS — I2510 Atherosclerotic heart disease of native coronary artery without angina pectoris: Secondary | ICD-10-CM | POA: Diagnosis not present

## 2014-02-18 DIAGNOSIS — Z8249 Family history of ischemic heart disease and other diseases of the circulatory system: Secondary | ICD-10-CM | POA: Insufficient documentation

## 2014-02-18 DIAGNOSIS — F3289 Other specified depressive episodes: Secondary | ICD-10-CM | POA: Diagnosis not present

## 2014-02-18 DIAGNOSIS — I2789 Other specified pulmonary heart diseases: Secondary | ICD-10-CM | POA: Insufficient documentation

## 2014-02-18 DIAGNOSIS — F329 Major depressive disorder, single episode, unspecified: Secondary | ICD-10-CM | POA: Insufficient documentation

## 2014-02-18 DIAGNOSIS — E785 Hyperlipidemia, unspecified: Secondary | ICD-10-CM | POA: Insufficient documentation

## 2014-02-18 DIAGNOSIS — I079 Rheumatic tricuspid valve disease, unspecified: Secondary | ICD-10-CM | POA: Insufficient documentation

## 2014-02-18 DIAGNOSIS — J45909 Unspecified asthma, uncomplicated: Secondary | ICD-10-CM | POA: Insufficient documentation

## 2014-02-18 DIAGNOSIS — E039 Hypothyroidism, unspecified: Secondary | ICD-10-CM | POA: Diagnosis not present

## 2014-02-18 DIAGNOSIS — R0989 Other specified symptoms and signs involving the circulatory and respiratory systems: Secondary | ICD-10-CM | POA: Diagnosis not present

## 2014-02-18 DIAGNOSIS — I059 Rheumatic mitral valve disease, unspecified: Secondary | ICD-10-CM | POA: Insufficient documentation

## 2014-02-18 DIAGNOSIS — I6529 Occlusion and stenosis of unspecified carotid artery: Secondary | ICD-10-CM | POA: Diagnosis not present

## 2014-02-18 DIAGNOSIS — Z7982 Long term (current) use of aspirin: Secondary | ICD-10-CM | POA: Diagnosis not present

## 2014-02-18 DIAGNOSIS — R0609 Other forms of dyspnea: Principal | ICD-10-CM

## 2014-02-18 DIAGNOSIS — M25559 Pain in unspecified hip: Secondary | ICD-10-CM | POA: Insufficient documentation

## 2014-02-18 DIAGNOSIS — Z5189 Encounter for other specified aftercare: Secondary | ICD-10-CM | POA: Insufficient documentation

## 2014-02-18 DIAGNOSIS — Z8673 Personal history of transient ischemic attack (TIA), and cerebral infarction without residual deficits: Secondary | ICD-10-CM | POA: Insufficient documentation

## 2014-02-18 DIAGNOSIS — K219 Gastro-esophageal reflux disease without esophagitis: Secondary | ICD-10-CM | POA: Insufficient documentation

## 2014-02-18 HISTORY — PX: LEFT AND RIGHT HEART CATHETERIZATION WITH CORONARY ANGIOGRAM: SHX5449

## 2014-02-18 HISTORY — PX: CARDIAC CATHETERIZATION: SHX172

## 2014-02-18 LAB — POCT I-STAT 3, ART BLOOD GAS (G3+)
Bicarbonate: 25.3 mEq/L — ABNORMAL HIGH (ref 20.0–24.0)
O2 Saturation: 90 %
PCO2 ART: 42.2 mmHg (ref 35.0–45.0)
PH ART: 7.385 (ref 7.350–7.450)
PO2 ART: 61 mmHg — AB (ref 80.0–100.0)
TCO2: 27 mmol/L (ref 0–100)

## 2014-02-18 LAB — POCT I-STAT 3, VENOUS BLOOD GAS (G3P V)
Acid-base deficit: 1 mmol/L (ref 0.0–2.0)
BICARBONATE: 24.7 meq/L — AB (ref 20.0–24.0)
O2 SAT: 67 %
PO2 VEN: 36 mmHg (ref 30.0–45.0)
TCO2: 26 mmol/L (ref 0–100)
pCO2, Ven: 44.5 mmHg — ABNORMAL LOW (ref 45.0–50.0)
pH, Ven: 7.353 — ABNORMAL HIGH (ref 7.250–7.300)

## 2014-02-18 SURGERY — LEFT AND RIGHT HEART CATHETERIZATION WITH CORONARY ANGIOGRAM
Anesthesia: LOCAL

## 2014-02-18 MED ORDER — ASPIRIN 81 MG PO CHEW
81.0000 mg | CHEWABLE_TABLET | ORAL | Status: AC
  Administered 2014-02-18: 81 mg via ORAL

## 2014-02-18 MED ORDER — SODIUM CHLORIDE 0.9 % IV SOLN: INTRAVENOUS | Status: DC

## 2014-02-18 MED ORDER — SODIUM CHLORIDE 0.9 % IV SOLN: 250.0000 mL | INTRAVENOUS | Status: DC | PRN

## 2014-02-18 MED ORDER — MORPHINE SULFATE 10 MG/ML IJ SOLN: 2.0000 mg | Freq: Once | INTRAMUSCULAR | Status: DC

## 2014-02-18 MED ORDER — ACETAMINOPHEN 325 MG PO TABS: 650.0000 mg | ORAL_TABLET | ORAL | Status: DC | PRN

## 2014-02-18 MED ORDER — SODIUM CHLORIDE 0.9 % IJ SOLN: 3.0000 mL | INTRAMUSCULAR | Status: DC | PRN

## 2014-02-18 MED ORDER — MIDAZOLAM HCL 2 MG/2ML IJ SOLN
INTRAMUSCULAR | Status: AC
  Filled 2014-02-18: qty 2

## 2014-02-18 MED ORDER — MORPHINE SULFATE 2 MG/ML IJ SOLN
INTRAMUSCULAR | Status: AC
  Administered 2014-02-18: 2 mg via INTRAVENOUS
  Filled 2014-02-18: qty 1

## 2014-02-18 MED ORDER — ASPIRIN 81 MG PO CHEW
CHEWABLE_TABLET | ORAL | Status: AC
  Administered 2014-02-18: 81 mg via ORAL
  Filled 2014-02-18: qty 1

## 2014-02-18 MED ORDER — SODIUM CHLORIDE 0.9 % IJ SOLN: 3.0000 mL | Freq: Two times a day (BID) | INTRAMUSCULAR | Status: DC

## 2014-02-18 MED ORDER — ONDANSETRON HCL 4 MG/2ML IJ SOLN: 4.0000 mg | Freq: Four times a day (QID) | INTRAMUSCULAR | Status: DC | PRN

## 2014-02-18 MED ORDER — FENTANYL CITRATE 0.05 MG/ML IJ SOLN
INTRAMUSCULAR | Status: AC
  Filled 2014-02-18: qty 2

## 2014-02-18 MED ORDER — SODIUM CHLORIDE 0.9 % IV SOLN
INTRAVENOUS | Status: DC
  Administered 2014-02-18: 07:00:00 via INTRAVENOUS

## 2014-02-18 NOTE — Interval H&P Note (Signed)
Cath Lab Visit (complete for each Cath Lab visit)  Clinical Evaluation Leading to the Procedure:   ACS: No.  Non-ACS:    Anginal Classification: CCS III  Anti-ischemic medical therapy: Minimal Therapy (1 class of medications)  Non-Invasive Test Results: No non-invasive testing performed  Prior CABG: Previous CABG      History and Physical Interval Note:  02/18/2014 8:45 AM  [name]  has presented today for surgery, with the diagnosis of aortic insufficiency   The various methods of treatment have been discussed with the patient and family. After consideration of risks, benefits and other options for treatment, the patient has consented to  Procedure(s): LEFT AND RIGHT HEART CATHETERIZATION WITH CORONARY ANGIOGRAM (N/A) as a surgical intervention .  The patient's history has been reviewed, patient examined, no change in status, stable for surgery.  I have reviewed the patient's chart and labs.  Questions were answered to the patient's satisfaction.     [name] [employer]

## 2014-02-18 NOTE — Discharge Instructions (Signed)

## 2014-02-18 NOTE — CV Procedure (Signed)
    Cardiac Catheterization Procedure Note  Name: [name] MRN: [id number] DOB: [birth year]  Procedure: Right Heart Cath, Left Heart Cath, Selective Coronary Angiography  Indication: Exertional dyspnea.    Procedural Details: The right groin was prepped, draped, and anesthetized with 1% lidocaine. Using the modified Seldinger technique a 5 French sheath was placed in the right femoral artery and a 7 French sheath was placed in the right femoral vein. A Swan-Ganz catheter was used for the right heart catheterization. Standard protocol was followed for recording of right heart pressures and sampling of oxygen saturations. Fick cardiac output was calculated. Standard Judkins catheters were used for selective coronary angiography. There were no immediate procedural complications. The patient was transferred to the post catheterization recovery area for further monitoring.  30 cc contrast was used.   Procedural Findings: Hemodynamics (mmHg) RA mean 3 RV 37/4 PA 34/13, mean 21 PCWP mean 10 LV 138/10 AO 135/54  Oxygen saturations: PA 67% AO 90%  Cardiac Output (Fick) 7.44  Cardiac Index (Fick) 3.94   Coronary angiography: Coronary dominance: right  Left mainstem: No significant disease.   Left anterior descending (LAD): The LAD itself was relatively small, no significant disease.  There was a large high D1 with 50% mid-vessel stenosis.   Left circumflex (LCx): Moderate high OM1, small OM2, moderate PLOM.  Minimal coronary disease in the LCx system.   Right coronary artery (RCA): No significant coronary disease.  Left ventriculography: Not done, CKD  Final Conclusions:  Mild nonobstructive CAD, normal filling pressures.  I do not think that her dyspnea is cardiac related after doing the TEE and right/left heart cath.  I think that she will need to continue working with Dr Lenna Gilford regarding her lung disease (has history of asthma with multiple flares.  She will need to increase  simvastatin to 20 mg daily with goal LDL < 70 (will review this at next visit.    [name] 02/18/2014, 9:29 AM

## 2014-02-18 NOTE — H&P (View-Only) (Signed)
Patient ID: [name], female   DOB: 13-Mar-1935, 79 y.o.   MRN: [id number] PCP: Dr. Jenny Reichmann Pulmonology: Dr. Lenna Gilford  79 yo with long history of asthma with flares as well as carotid stenosis s/p CEA presents for cardiology followup.  She had a Lexiscan Cardiolite in 4/14 with no ischemia or infarction.  She has been doing pulmonary rehab for her chronic asthma.  This has not helped much.   For the last three months, her exertional dyspnea has been worse.  She is short of breath after walking 20 yards.  No orthopnea or PND.  She is very limited.  She has had minimal wheezing recently.  No chest pain.  I had her get an echocardiogram after last appointment (6/15).  This showed EF 60-65%, moderate AI, severe MR, moderate TR with PA systolic pressure 65 mmHg.  This is a progression of valvular disease compared to the prior echo.    Labs (3/13): LDL 103, HDL 91 Labs (4/13): K 4.6, creatinine 1.29 Labs (10/14): K 4.4, creatinine 1.3, LDL 66, HDL 76 Labs (6/15): K 3.9, creatinine 1.1, LDL 103, HDL 77  PMH: 1. Carotid stenosis: Amaurosis fugax in 2010, then right CEA in 6/10 (follows with Kellie Simmering).  2. Hypothyroidism 3. GERD 4. H/o diverticulitis with sigmoid colectomy in 2003.   5. OA with right TKR in 2008.  6. Asthma with flares 7. HTN 8. Dyspnea: Negative stress test in 1995. Lexiscan Cardiolite (4/14) with no ischemia or infarction.   9.  Valvular heart disease: Echo (6/14) with EF 55-60%, mild LVH, very mild AS, moderate AI, mild MR, moderate TR.  Echo (6/15) with EF 60-65%, moderate AI, severe MR, moderate TR, PA systolic pressure 65 mmHg.  10. Depression  SH: Married, never smoked, retired from [place]  FH: Father with CABG in his 50s, mother with CAD.  Mother with rheumatic heart disease, had valve replacement.   ROS: All systems reviewed and negative except as per HPI.   Current Outpatient Prescriptions  Medication Sig Dispense Refill  . Cholecalciferol (VITAMIN D) 2000 UNITS CAPS  Take 1 capsule by mouth daily.      . Cranberry-Vitamin C-Probiotic (AZO CRANBERRY) 250-30 MG TABS Take 1 capsule by mouth daily.      Marland Kitchen escitalopram (LEXAPRO) 10 MG tablet Take 1 tablet (10 mg total) by mouth daily.  90 tablet  3  . ferrous sulfate 325 (65 FE) MG tablet Take 325 mg by mouth daily with breakfast.      . fish oil-omega-3 fatty acids 1000 MG capsule Take 2 g by mouth daily.      . Fluticasone-Salmeterol (ADVAIR DISKUS) 250-50 MCG/DOSE AEPB Inhale 1 puff into the lungs 2 (two) times daily.  3 each  3  . levothyroxine (SYNTHROID, LEVOTHROID) 88 MCG tablet Take 1 tablet (88 mcg total) by mouth daily.  90 tablet  3  . losartan (COZAAR) 100 MG tablet Take 1 tablet (100 mg total) by mouth daily.  90 tablet  1  . naproxen sodium (ANAPROX) 220 MG tablet Take 440 mg by mouth daily.       . Nutritional Supplements (OSTEO ADVANCE) TABS Take 2 tablets by mouth daily.       . simvastatin (ZOCOR) 10 MG tablet Take 1 tablet (10 mg total) by mouth at bedtime.  90 tablet  3  . albuterol (PROVENTIL HFA;VENTOLIN HFA) 108 (90 BASE) MCG/ACT inhaler Inhale 2 puffs into the lungs every 6 (six) hours as needed for wheezing or shortness of breath.      Marland Kitchen  aspirin EC 81 MG tablet Take 1 tablet (81 mg total) by mouth daily.  90 tablet  3  . furosemide (LASIX) 20 MG tablet Take 1 tablet (20 mg total) by mouth daily.  30 tablet  3  . omeprazole (PRILOSEC) 20 MG capsule Take 20 mg by mouth daily.      . potassium chloride SA (K-DUR,KLOR-CON) 20 MEQ tablet Take 1 tablet (20 mEq total) by mouth daily.  30 tablet  3   Current Facility-Administered Medications  Medication Dose Route Frequency Provider Last Rate Last Dose  . pneumococcal 13-valent conjugate vaccine (PREVNAR 13) injection 0.5 mL  0.5 mL Intramuscular Once Biagio Borg, MD       BP 159/70  Pulse 78  Ht 5\' 5"  (1.651 m)  Wt 186 lb (84.369 kg)  BMI 30.95 kg/m2 General: NAD, overweight Neck: No JVD, no thyromegaly or thyroid nodule.  Lungs: Clear  but somewhat prolonged expiratory phase.  CV: Nondisplaced PMI.  Heart regular S1/S2, no S3/S4, 2/6 HSM LLSB, apex.  No peripheral edema.  No carotid bruit.  Normal pedal pulses.  Abdomen: Soft, nontender, no hepatosplenomegaly, no distention.  Skin: Intact without lesions or rashes.  Neurologic: Alert and oriented x 3.  Psych: Normal affect. Extremities: No clubbing or cyanosis.   Assessment/Plan: 1. Exertional dyspnea: Asthma seems stable and she has not been wheezing much.  Her symptoms are significantly increased over the last few months, now NYHA class III.  Her echo in 6/15 showed significant worsening of valvular disease, now with severe MR, moderate AI, moderate TR, and at least moderate pulmonary hypertension.  I am concerned that valvular disease is the driver of her dyspnea.  I am unsure about the underlying valvular pathology, ?rheumatic heart disease.  - I will arrange TEE to more closely assess her valves.  - I will arrange LHC/RHC on the same day.  If TEE shows significant valvular disease, LHC/RHC would be needed prior to any surgery.  If valvular disease is not as severe as thought by TTE, then LHC/RHC will still be appropriate for diagnosis of worsening exertional symptoms with no explanation. ] - I will start her on Lasix 20 mg daily with KCl 20 mEq daily.  BMET in 10 days.  2. Hyperlipidemia: She has known vascular disease (carotid stenosis).  Continue statin.  Good lipids in 10/14.   3. HTN: BP high today but nervous.  Has been normal at pulmonary rehab and prior appointments.   4. Carotid stenosis s/p CEA: OK to decrease ASA to 81 mg daily.  Continue statin.  Followed by [facility].   Followup 1 month.   [name] 01/26/2014

## 2014-02-19 ENCOUNTER — Encounter: Payer: Self-pay | Admitting: *Deleted

## 2014-02-23 ENCOUNTER — Ambulatory Visit (INDEPENDENT_AMBULATORY_CARE_PROVIDER_SITE_OTHER): Payer: Medicare Other | Admitting: Cardiology

## 2014-02-23 ENCOUNTER — Encounter: Payer: Self-pay | Admitting: Cardiology

## 2014-02-23 VITALS — BP 122/66 | HR 81 | Ht 65.0 in | Wt 177.0 lb

## 2014-02-23 DIAGNOSIS — E785 Hyperlipidemia, unspecified: Secondary | ICD-10-CM

## 2014-02-23 DIAGNOSIS — I6529 Occlusion and stenosis of unspecified carotid artery: Secondary | ICD-10-CM

## 2014-02-23 DIAGNOSIS — I38 Endocarditis, valve unspecified: Secondary | ICD-10-CM

## 2014-02-23 DIAGNOSIS — I059 Rheumatic mitral valve disease, unspecified: Secondary | ICD-10-CM | POA: Diagnosis not present

## 2014-02-23 DIAGNOSIS — I10 Essential (primary) hypertension: Secondary | ICD-10-CM

## 2014-02-23 DIAGNOSIS — G459 Transient cerebral ischemic attack, unspecified: Secondary | ICD-10-CM

## 2014-02-23 DIAGNOSIS — R0602 Shortness of breath: Secondary | ICD-10-CM

## 2014-02-23 MED ORDER — SIMVASTATIN 20 MG PO TABS: 20.0000 mg | ORAL_TABLET | Freq: Every day | ORAL | Status: DC

## 2014-02-23 NOTE — Progress Notes (Signed)
Patient ID: [name], female   DOB: 1935/06/20, 79 y.o.   MRN: [id number] PCP: Dr. Jenny Reichmann Pulmonology: Dr. Lenna Gilford  79 yo with long history of asthma with flares as well as carotid stenosis s/p CEA presents for cardiology followup.  She had a Lexiscan Cardiolite in 4/14 with no ischemia or infarction.  She has been doing pulmonary rehab for her chronic asthma.  This has not helped much.   For the last several months, her exertional dyspnea and fatigue has been worse.  She is short of breath after walking 20 yards.  No orthopnea or PND.  She is very limited.  She has had minimal wheezing recently.  No chest pain.  I had her get an echocardiogram in 6/15.  This showed EF 60-65%, moderate AI, severe MR, moderate TR with PA systolic pressure 65 mmHg.  This was a progression of valvular disease compared to the prior echo.    Given the concern for progressive MR echo, I had her do a TEE.  This actually showed moderate AI and only mild MR.  EF was 60-65%.  Giving ongoing exertional symptoms, I got a right and left heart cath.  This showed nonobstructive CAD and normal filling pressures.  There was no PAH.  She continues to have the same symptoms.  Of note, I tried her empirically on Lasix.  This did not help her symptoms.    ECG: NSR, PACs  Labs (3/13): LDL 103, HDL 91 Labs (4/13): K 4.6, creatinine 1.29 Labs (10/14): K 4.4, creatinine 1.3, LDL 66, HDL 76 Labs (6/15): K 3.9, creatinine 1.1, LDL 103, HDL 77 Labs (7/15): K 4.4, creatinine 1.5, HCT 32.1  PMH: 1. Carotid stenosis: Amaurosis fugax in 2010, then right CEA in 6/10 (follows with Kellie Simmering).  2. Hypothyroidism 3. GERD 4. H/o diverticulitis with sigmoid colectomy in 2003.   5. OA with right TKR in 2008.  6. Asthma with flares 7. HTN 8. Dyspnea: Negative stress test in 1995. Lexiscan Cardiolite (4/14) with no ischemia or infarction.   9.  Valvular heart disease: Echo (6/14) with EF 55-60%, mild LVH, very mild AS, moderate AI, mild MR, moderate  TR.  Echo (6/15) with EF 60-65%, moderate AI, severe MR, moderate TR, PA systolic pressure 65 mmHg.  TEE (7/15) with EF 60-65%, normal RV size and systolic function, moderate AI, mild MR.  LHC/RHC (7/15): mean RA 3, PA 34/13 mean 21, mean PCWP 10, CI 3.94, nonobstructive CAD (high D1 with 50% stenosis).  10. Depression  SH: Married, never smoked, retired from [employer]  FH: Father with CABG in his 2s, mother with CAD.  Mother with rheumatic heart disease, had valve replacement.   ROS: All systems reviewed and negative except as per HPI.   Current Outpatient Prescriptions  Medication Sig Dispense Refill  . albuterol (PROVENTIL HFA;VENTOLIN HFA) 108 (90 BASE) MCG/ACT inhaler Inhale 2 puffs into the lungs every 6 (six) hours as needed for wheezing or shortness of breath.      Marland Kitchen amLODipine (NORVASC) 2.5 MG tablet Take 1 tablet (2.5 mg total) by mouth daily.  30 tablet  3  . aspirin EC 81 MG tablet Take 1 tablet (81 mg total) by mouth daily.  90 tablet  3  . Cholecalciferol (VITAMIN D) 2000 UNITS CAPS Take 1 capsule by mouth daily.      . Cranberry-Vitamin C-Probiotic (AZO CRANBERRY) 250-30 MG TABS Take 1 capsule by mouth daily.      Marland Kitchen escitalopram (LEXAPRO) 10 MG tablet Take 1 tablet (  10 mg total) by mouth daily.  90 tablet  3  . ferrous sulfate 325 (65 FE) MG tablet Take 325 mg by mouth daily with breakfast.      . fish oil-omega-3 fatty acids 1000 MG capsule Take 2 g by mouth daily.      . Fluticasone-Salmeterol (ADVAIR DISKUS) 250-50 MCG/DOSE AEPB Inhale 1 puff into the lungs 2 (two) times daily.  3 each  3  . levothyroxine (SYNTHROID, LEVOTHROID) 88 MCG tablet Take 1 tablet (88 mcg total) by mouth daily.  90 tablet  3  . losartan (COZAAR) 50 MG tablet Take 1 tablet (50 mg total) by mouth daily.      . Nutritional Supplements (OSTEO ADVANCE) TABS Take 2 tablets by mouth daily.       Marland Kitchen omeprazole (PRILOSEC) 20 MG capsule Take 20 mg by mouth daily.      . simvastatin (ZOCOR) 20 MG tablet Take 1  tablet (20 mg total) by mouth at bedtime.  30 tablet  3   No current facility-administered medications for this visit.   BP 122/66  Pulse 81  Ht 5\' 5"  (1.651 m)  Wt 177 lb (80.287 kg)  BMI 29.45 kg/m2 General: NAD, overweight Neck: No JVD, no thyromegaly or thyroid nodule.  Lungs: Clear but somewhat prolonged expiratory phase. Crackles right base.  CV: Nondisplaced PMI.  Heart regular S1/S2, no S3/S4, 2/6 HSM LLSB, apex.  No peripheral edema.  No carotid bruit.  Normal pedal pulses.  Abdomen: Soft, nontender, no hepatosplenomegaly, no distention.  Skin: Intact without lesions or rashes.  Neurologic: Alert and oriented x 3.  Psych: Normal affect. Extremities: No clubbing or cyanosis.   Assessment/Plan: 1. Exertional dyspnea:  Her echo in 6/15 showed significant worsening of valvular disease, with severe MR, moderate AI, moderate TR, and at least moderate pulmonary hypertension. I did a TEE, that showed moderate AI but only mild MR.  I tried her on Lasix with no improvement.  LHC/RHC showed mild nonobstructive CAD and normal filling pressures.  I suspect that her exertional dyspnea is lung-related.  I would like her to followup with Dr. Lenna Gilford for further pulmonary evaluation.  2. Hyperlipidemia: She has known vascular disease (carotid stenosis).  Would like to see LDL < 70.  I will increase simvastatin to 20 mg daily with lipids/LFTs in 3 months.   3. HTN: BP controlled.  4. Carotid stenosis s/p CEA: Continue ASA 81 mg daily. Continue statin.  Followed by [facility]. 5. Fatigue: Check TSH.   6. CKD: Check BMET.   Followup 6 months.   [name] 02/23/2014

## 2014-02-23 NOTE — Patient Instructions (Signed)
Increase Zocor (simvastatin) to 20mg  daily.  Your physician recommends that you have lab today--BMET/BNP/CBCd  Your physician recommends that you return for a FASTING lipid profile /liver profile in 2 months.   Your physician wants you to follow-up in: 6 months with Dr Aundra Dubin. (January 2016).  You will receive a reminder letter in the mail two months in advance. If you don't receive a letter, please call our office to schedule the follow-up appointment.

## 2014-02-24 LAB — BRAIN NATRIURETIC PEPTIDE: Pro B Natriuretic peptide (BNP): 54 pg/mL (ref 0.0–100.0)

## 2014-02-24 LAB — CBC WITH DIFFERENTIAL/PLATELET
Basophils Absolute: 0 10*3/uL (ref 0.0–0.1)
Basophils Relative: 0.1 % (ref 0.0–3.0)
EOS ABS: 0.2 10*3/uL (ref 0.0–0.7)
Eosinophils Relative: 1.6 % (ref 0.0–5.0)
HEMATOCRIT: 31.7 % — AB (ref 36.0–46.0)
Hemoglobin: 10.7 g/dL — ABNORMAL LOW (ref 12.0–15.0)
LYMPHS PCT: 18.9 % (ref 12.0–46.0)
Lymphs Abs: 1.9 10*3/uL (ref 0.7–4.0)
MCHC: 33.7 g/dL (ref 30.0–36.0)
MCV: 99 fl (ref 78.0–100.0)
Monocytes Absolute: 0.4 10*3/uL (ref 0.1–1.0)
Monocytes Relative: 4.4 % (ref 3.0–12.0)
Neutro Abs: 7.5 10*3/uL (ref 1.4–7.7)
Neutrophils Relative %: 75 % (ref 43.0–77.0)
Platelets: 231 10*3/uL (ref 150.0–400.0)
RBC: 3.21 Mil/uL — ABNORMAL LOW (ref 3.87–5.11)
RDW: 13.4 % (ref 11.5–15.5)
WBC: 10 10*3/uL (ref 4.0–10.5)

## 2014-02-24 LAB — BASIC METABOLIC PANEL
BUN: 33 mg/dL — AB (ref 6–23)
CO2: 29 mEq/L (ref 19–32)
CREATININE: 1.9 mg/dL — AB (ref 0.4–1.2)
Calcium: 9.1 mg/dL (ref 8.4–10.5)
Chloride: 107 mEq/L (ref 96–112)
GFR: 28.13 mL/min — AB (ref >60.00)
Glucose, Bld: 146 mg/dL — ABNORMAL HIGH (ref 70–99)
Potassium: 4.1 mEq/L (ref 3.5–5.1)
Sodium: 139 mEq/L (ref 135–145)

## 2014-02-25 ENCOUNTER — Encounter: Payer: Self-pay | Admitting: Family

## 2014-02-25 ENCOUNTER — Encounter (HOSPITAL_COMMUNITY)
Admission: RE | Admit: 2014-02-25 | Discharge: 2014-02-25 | Disposition: A | Discharge status: Home / Self Care | Payer: Medicare Other | Source: Ambulatory Visit | Attending: Pulmonary Disease | Admitting: Pulmonary Disease

## 2014-02-25 NOTE — Progress Notes (Addendum)
Today, [name] exercised at [employer]. Service time was from 1330 to 1515.  The patient exercised for more than 31 minutes performing aerobic, strengthening, and stretching exercises. Oxygen saturation, heart rate, blood pressure, rate of perceived exertion, and shortness of breath were all monitored before, during, and after exercise. [name] presented with no problems at today's exercise session. [name] also attended an education session on warning signs/symptoms of emergencies for the pulmonary patient.  There was no workload change during today's exercise session.  Pre-exercise vitals:   Weight kg: 80.5   Liters of O2: ra   SpO2: 97   HR: 76   BP: 108/40   CBG: na  Exercise vitals:   Highest heartrate:  85   Lowest oxygen saturation: 96   Highest blood pressure: 122/62   Liters of 02: ra  Post-exercise vitals:   SpO2: 98   HR: 74   BP: 130/52, down to 124/62   Liters of O2: ra   CBG: na  Dr. Brand Males, Medical Director Dr. Coralyn Pear is immediately available during today's Pulmonary Rehab session for [facility] on 02/25/2014 at 1330 class time.

## 2014-02-26 ENCOUNTER — Telehealth: Payer: Self-pay | Admitting: *Deleted

## 2014-02-26 ENCOUNTER — Inpatient Hospital Stay (HOSPITAL_COMMUNITY): Admission: RE | Admit: 2014-02-26 | Payer: Medicare Other | Source: Ambulatory Visit

## 2014-02-26 ENCOUNTER — Ambulatory Visit: Payer: Medicare Other | Admitting: Family

## 2014-02-26 DIAGNOSIS — I10 Essential (primary) hypertension: Secondary | ICD-10-CM

## 2014-02-26 DIAGNOSIS — I059 Rheumatic mitral valve disease, unspecified: Secondary | ICD-10-CM

## 2014-02-26 MED ORDER — AMLODIPINE BESYLATE 5 MG PO TABS: 5.0000 mg | ORAL_TABLET | Freq: Every day | ORAL | Status: DC

## 2014-02-26 NOTE — Telephone Encounter (Signed)
Notes Recorded by Larey Dresser, MD on 02/26/2014 at 12:33 PM Creatinine higher, stop losartan and increase amlodipine to 5 mg daily. Repeat BMET in 10 days.  Pt advised, verbalized understanding.

## 2014-03-02 ENCOUNTER — Encounter (HOSPITAL_COMMUNITY)
Admission: RE | Admit: 2014-03-02 | Discharge: 2014-03-02 | Disposition: A | Discharge status: Home / Self Care | Payer: Medicare Other | Source: Ambulatory Visit | Attending: Pulmonary Disease | Admitting: Pulmonary Disease

## 2014-03-02 DIAGNOSIS — M25559 Pain in unspecified hip: Secondary | ICD-10-CM | POA: Diagnosis not present

## 2014-03-02 DIAGNOSIS — Z5189 Encounter for other specified aftercare: Secondary | ICD-10-CM | POA: Diagnosis not present

## 2014-03-02 DIAGNOSIS — J45909 Unspecified asthma, uncomplicated: Secondary | ICD-10-CM | POA: Insufficient documentation

## 2014-03-02 DIAGNOSIS — Z8673 Personal history of transient ischemic attack (TIA), and cerebral infarction without residual deficits: Secondary | ICD-10-CM | POA: Insufficient documentation

## 2014-03-02 NOTE — Progress Notes (Signed)
Today, [name] exercised at [employer]. Service time was from 1330 to 1515.  The patient exercised for more than 31 minutes performing aerobic, strengthening, and stretching exercises. Oxygen saturation, heart rate, blood pressure, rate of perceived exertion, and shortness of breath were all monitored before, during, and after exercise. [name] presented with no problems at today's exercise session.   There was no workload change during today's exercise session.  Pre-exercise vitals:   Weight kg: 82.1   Liters of O2: RA   SpO2: 98   HR: 89   BP: 120/60   CBG: NA  Exercise vitals:   Highest heartrate:  85   Lowest oxygen saturation: 94   Highest blood pressure: 150/70   Liters of 02: RA  Post-exercise vitals:   SpO2: 96   HR: 74   BP: 130/56   Liters of O2: RA   CBG: NA Dr. Brand Males, Medical Director Dr. Coralyn Pear is immediately available during today's Pulmonary Rehab session for [facility] on 03/02/2014 at 1330 class time.

## 2014-03-02 NOTE — Progress Notes (Signed)
I have reviewed a Home Exercise Prescription with [facility] . [name] is not currently exercising at home.  The patient was advised to walk 3 days a week for 20-25 minutes.  Nikeria and I discussed how to progress their exercise prescription.  The patient stated that their goals were to be able to walk and breathe better.  The patient stated that they understand the exercise prescription.  We reviewed exercise guidelines, target heart rate during exercise, oxygen use, weather, home pulse oximeter, endpoints for exercise, and goals.  Patient is encouraged to come to me with any questions. I will continue to follow up with the patient to assist them with progression and safety.

## 2014-03-08 ENCOUNTER — Other Ambulatory Visit (INDEPENDENT_AMBULATORY_CARE_PROVIDER_SITE_OTHER): Payer: Medicare Other

## 2014-03-08 DIAGNOSIS — I10 Essential (primary) hypertension: Secondary | ICD-10-CM

## 2014-03-08 LAB — BASIC METABOLIC PANEL
BUN: 19 mg/dL (ref 6–23)
CALCIUM: 9.4 mg/dL (ref 8.4–10.5)
CO2: 28 mEq/L (ref 19–32)
CREATININE: 1.2 mg/dL (ref 0.4–1.2)
Chloride: 106 mEq/L (ref 96–112)
GFR: 45.91 mL/min — ABNORMAL LOW (ref >60.00)
Glucose, Bld: 88 mg/dL (ref 70–99)
Potassium: 4 mEq/L (ref 3.5–5.1)
SODIUM: 144 meq/L (ref 135–145)

## 2014-03-09 ENCOUNTER — Encounter (HOSPITAL_COMMUNITY)
Admission: RE | Admit: 2014-03-09 | Discharge: 2014-03-09 | Disposition: A | Discharge status: Home / Self Care | Payer: Medicare Other | Source: Ambulatory Visit | Attending: Pulmonary Disease | Admitting: Pulmonary Disease

## 2014-03-09 DIAGNOSIS — Z5189 Encounter for other specified aftercare: Secondary | ICD-10-CM | POA: Diagnosis not present

## 2014-03-09 NOTE — Progress Notes (Signed)
Today, [name] exercised at [employer]. Service time was from 1330 to 1515.  The patient exercised for more than 31 minutes performing aerobic, strengthening, and stretching exercises. Oxygen saturation, heart rate, blood pressure, rate of perceived exertion, and shortness of breath were all monitored before, during, and after exercise. [name] presented with no problems at today's exercise session.   There was no workload change during today's exercise session.  Pre-exercise vitals:   Weight kg: 82.3   Liters of O2: ra   SpO2: 96   HR: 78   BP: 122/50   CBG: na  Exercise vitals:   Highest heartrate:  91   Lowest oxygen saturation: 94   Highest blood pressure: 134/60   Liters of 02: ra  Post-exercise vitals:   SpO2: 96   HR: 68   BP: 112/60   Liters of O2: ra   CBG: na  Dr. Brand Males, Medical Director Dr. Frederic Jericho is immediately available during today's Pulmonary Rehab session for [facility] on 03/09/2014 at 1330 class time.

## 2014-03-11 ENCOUNTER — Encounter (HOSPITAL_COMMUNITY): Payer: Medicare Other

## 2014-03-16 ENCOUNTER — Encounter (HOSPITAL_COMMUNITY)
Admission: RE | Admit: 2014-03-16 | Discharge: 2014-03-16 | Disposition: A | Discharge status: Home / Self Care | Payer: Medicare Other | Source: Ambulatory Visit | Attending: Pulmonary Disease | Admitting: Pulmonary Disease

## 2014-03-16 DIAGNOSIS — Z5189 Encounter for other specified aftercare: Secondary | ICD-10-CM | POA: Diagnosis not present

## 2014-03-16 NOTE — Progress Notes (Signed)
Today, [name] exercised at [employer]. Service time was from 1330 to 1500.  The patient exercised for more than 31 minutes performing aerobic, strengthening, and stretching exercises. Oxygen saturation, heart rate, blood pressure, rate of perceived exertion, and shortness of breath were all monitored before, during, and after exercise. [name] presented with no problems at today's exercise session.   There was no workload change during today's exercise session.  Pre-exercise vitals:   Weight kg: 81.5   Liters of O2: RA   SpO2: 97   HR: 76   BP: 104/50   CBG: NA  Exercise vitals:   Highest heartrate:  90   Lowest oxygen saturation: 95   Highest blood pressure: 150/74   Liters of 02: RA  Post-exercise vitals:   SpO2: 96   HR: 73   BP: 116/62   Liters of O2: RA   CBG: NA Dr. Brand Males, Medical Director Dr. Maryland Pink is immediately available during today's Pulmonary Rehab session for [facility] on 03/16/2014 at 1330 class time.

## 2014-03-18 ENCOUNTER — Encounter (HOSPITAL_COMMUNITY)
Admission: RE | Admit: 2014-03-18 | Discharge: 2014-03-18 | Disposition: A | Discharge status: Home / Self Care | Payer: Medicare Other | Source: Ambulatory Visit | Attending: Pulmonary Disease | Admitting: Pulmonary Disease

## 2014-03-18 DIAGNOSIS — Z5189 Encounter for other specified aftercare: Secondary | ICD-10-CM | POA: Diagnosis not present

## 2014-03-18 NOTE — Progress Notes (Signed)
Today, [name] exercised at [employer]. [name]. Service time was from 1330 to 1530.  The patient exercised for more than 31 minutes performing aerobic, strengthening, and stretching exercises. Oxygen saturation, heart rate, blood pressure, rate of perceived exertion, and shortness of breath were all monitored before, during, and after exercise. [name] presented with no problems at today's exercise session. [name] also attended an education session on home oxygen use.  There was no workload change during today's exercise session.  Pre-exercise vitals:   Weight kg: 81.5   Liters of O2: ra   SpO2: 97   HR: 81   BP: 112/50   CBG: na  Exercise vitals:   Highest heartrate:  79   Lowest oxygen saturation: 96   Highest blood pressure: 154/50   Liters of 02: ra  Post-exercise vitals:   SpO2: 95   HR: 73   BP: 112/60   Liters of O2: ra   CBG: na  [name], [name] [name] is immediately available during today's Pulmonary [name] session for [name] on 03/18/2014 at 1330 class time.

## 2014-03-23 ENCOUNTER — Encounter (HOSPITAL_COMMUNITY): Payer: Medicare Other

## 2014-03-25 ENCOUNTER — Encounter (HOSPITAL_COMMUNITY): Payer: Medicare Other

## 2014-03-30 ENCOUNTER — Encounter (HOSPITAL_COMMUNITY)
Admission: RE | Admit: 2014-03-30 | Discharge: 2014-03-30 | Disposition: A | Discharge status: Home / Self Care | Payer: Medicare Other | Source: Ambulatory Visit | Attending: Pulmonary Disease | Admitting: Pulmonary Disease

## 2014-03-30 DIAGNOSIS — Z8673 Personal history of transient ischemic attack (TIA), and cerebral infarction without residual deficits: Secondary | ICD-10-CM | POA: Diagnosis not present

## 2014-03-30 DIAGNOSIS — J45909 Unspecified asthma, uncomplicated: Secondary | ICD-10-CM | POA: Diagnosis not present

## 2014-03-30 DIAGNOSIS — M25559 Pain in unspecified hip: Secondary | ICD-10-CM | POA: Diagnosis not present

## 2014-03-30 DIAGNOSIS — Z5189 Encounter for other specified aftercare: Secondary | ICD-10-CM | POA: Diagnosis not present

## 2014-03-30 NOTE — Progress Notes (Signed)
Today, Samanvi exercised at [employer]. Service time was from 1330 to 1515.  The patient exercised for more than 31 minutes performing aerobic, strengthening, and stretching exercises. Oxygen saturation, heart rate, blood pressure, rate of perceived exertion, and shortness of breath were all monitored before, during, and after exercise. Samyra presented with no problems at today's exercise session.   There was no workload change during today's exercise session.  Pre-exercise vitals:   Weight kg: 81.5   Liters of O2: RA   SpO2: 97   HR: 74   BP: 128/64   CBG: NA  Exercise vitals:   Highest heartrate:  83   Lowest oxygen saturation: 96   Highest blood pressure: 142/66   Liters of 02: RA  Post-exercise vitals:   SpO2: 97   HR: 69   BP: 122/54   Liters of O2: RA   CBG: NA Dr. Brand Males, Medical Director Dr. Broadus [name] is immediately available during today's Pulmonary Rehab session for [facility] on 03/30/2014 at 1330 class time.

## 2014-04-01 ENCOUNTER — Encounter (HOSPITAL_COMMUNITY): Admission: RE | Admit: 2014-04-01 | Payer: Medicare Other | Source: Ambulatory Visit

## 2014-04-06 ENCOUNTER — Encounter (HOSPITAL_COMMUNITY)
Admission: RE | Admit: 2014-04-06 | Discharge: 2014-04-06 | Disposition: A | Discharge status: Home / Self Care | Payer: Medicare Other | Source: Ambulatory Visit | Attending: Pulmonary Disease | Admitting: Pulmonary Disease

## 2014-04-06 DIAGNOSIS — Z5189 Encounter for other specified aftercare: Secondary | ICD-10-CM | POA: Diagnosis not present

## 2014-04-06 NOTE — Progress Notes (Signed)
Today, [name] exercised at [employer]. Service time was from 1330 to 1500.  The patient exercised for more than 31 minutes performing aerobic, strengthening, and stretching exercises. Oxygen saturation, heart rate, blood pressure, rate of perceived exertion, and shortness of breath were all monitored before, during, and after exercise. [name] presented with no problems at today's exercise session.   There was no workload change during today's exercise session.  Pre-exercise vitals:   Weight kg: 82.4   Liters of O2: ra   SpO2: 100   HR: 83   BP: 134/68   CBG: na  Exercise vitals:   Highest heartrate:  91   Lowest oxygen saturation: 94   Highest blood pressure: 128/70   Liters of 02: ra  Post-exercise vitals:   SpO2: 96   HR: 65   BP: 124/62   Liters of O2: ra   CBG: na  Dr. Brand Males, Medical Director Dr. Wendee Beavers is immediately available during today's Pulmonary Rehab session for [facility] on 04/06/2014 at 1330 class time.

## 2014-04-08 ENCOUNTER — Encounter (HOSPITAL_COMMUNITY): Payer: Medicare Other

## 2014-04-13 ENCOUNTER — Encounter (HOSPITAL_COMMUNITY)
Admission: RE | Admit: 2014-04-13 | Discharge: 2014-04-13 | Disposition: A | Discharge status: Home / Self Care | Payer: Medicare Other | Source: Ambulatory Visit | Attending: Pulmonary Disease | Admitting: Pulmonary Disease

## 2014-04-13 DIAGNOSIS — Z5189 Encounter for other specified aftercare: Secondary | ICD-10-CM | POA: Diagnosis not present

## 2014-04-13 NOTE — Progress Notes (Signed)
Today, [name] exercised at [employer]. [facility]. Service time was from 1330 to 1500.  The patient exercised for more than 31 minutes performing aerobic, strengthening, and stretching exercises. Oxygen saturation, heart rate, blood pressure, rate of perceived exertion, and shortness of breath were all monitored before, during, and after exercise. [name] presented with no problems at today's exercise session.   There was no workload change during today's exercise session.  Pre-exercise vitals:   Weight kg: 82.1   Liters of O2: ra   SpO2: 97   HR: 82   BP: 128/60   CBG: na  Exercise vitals:   Highest heartrate:  84   Lowest oxygen saturation: 94   Highest blood pressure: 126/62   Liters of 02: ra  Post-exercise vitals:   SpO2: 96   HR: 63   BP: 128/68   Liters of O2: ra   CBG: na  Dr. Brand Males, Medical Director Dr. Coralyn Pear is immediately available during today's Pulmonary Rehab session for [facility] on 04/13/2014 at 1330 class time.

## 2014-04-15 ENCOUNTER — Encounter (HOSPITAL_COMMUNITY)
Admission: RE | Admit: 2014-04-15 | Discharge: 2014-04-15 | Disposition: A | Discharge status: Home / Self Care | Payer: Medicare Other | Source: Ambulatory Visit | Attending: Pulmonary Disease | Admitting: Pulmonary Disease

## 2014-04-15 DIAGNOSIS — Z5189 Encounter for other specified aftercare: Secondary | ICD-10-CM | POA: Diagnosis not present

## 2014-04-15 NOTE — Progress Notes (Signed)
[name] completed a Six-Minute Walk Test on 04/15/14 . [name] walked 800 feet with 0 breaks.  The patient's lowest oxygen saturation was 92% , highest heart rate was 98bpm , and highest blood pressure was 160/60. The patient was on room air.  [name] stated that fatigue hindered their walk test.

## 2014-04-15 NOTE — Progress Notes (Signed)
Discharge Note from Pulmonary Rehab  Patient graduated from pulmonary rehab today.  She was a pleasure to have in the program.  She feels she became stronger physically from participating, but still is unable to exercise at the level she would like.  She requires rest breaks when walking on the track, but is able to continue after resting 1-2 minutes.  She plans on walking at [facility] with her granddaughter for exercise after discharge from the program.  She scored  0 on her PHQ scale.  She feels she did benefit from participating in the program.

## 2014-04-16 ENCOUNTER — Encounter: Payer: Self-pay | Admitting: Family

## 2014-04-19 ENCOUNTER — Ambulatory Visit (HOSPITAL_COMMUNITY)
Admission: RE | Admit: 2014-04-19 | Discharge: 2014-04-19 | Disposition: A | Discharge status: Home / Self Care | Payer: Medicare Other | Source: Ambulatory Visit | Attending: Family | Admitting: Family

## 2014-04-19 ENCOUNTER — Ambulatory Visit (INDEPENDENT_AMBULATORY_CARE_PROVIDER_SITE_OTHER): Payer: Medicare Other | Admitting: Family

## 2014-04-19 ENCOUNTER — Encounter: Payer: Self-pay | Admitting: Family

## 2014-04-19 VITALS — BP 122/59 | HR 61 | Resp 16 | Ht 65.0 in | Wt 180.0 lb

## 2014-04-19 DIAGNOSIS — Z48812 Encounter for surgical aftercare following surgery on the circulatory system: Secondary | ICD-10-CM

## 2014-04-19 DIAGNOSIS — I6529 Occlusion and stenosis of unspecified carotid artery: Secondary | ICD-10-CM

## 2014-04-19 DIAGNOSIS — M79609 Pain in unspecified limb: Secondary | ICD-10-CM

## 2014-04-19 DIAGNOSIS — M79605 Pain in left leg: Secondary | ICD-10-CM

## 2014-04-19 NOTE — Progress Notes (Signed)
Established Carotid Patient   History of Present Illness  [name] is a 79 y.o. female patient of Dr. Kellie Simmering who status post right carotid endarterectomy in 2010. She returns today for follow up. Right before her right CEA, she had a right retinal embolus with monocular loss of vision, right arm weakness and trembling, both of the above symptoms remain. She denies right leg weakness, denies expressive aphasia at that time.  She had a TIA about 2012 or 2013 as manifested by confusion and expressive aphasia, denies any further hemiparesis, denies any further loss of vision, denies any further TIA or stroke activity. She denies claudication symptoms with walking. She has had asthma since childhood.  Pt reports New Medical or Surgical History: she is in pulmonary therapy. Her balance is off, has low energy. She sates that she declined an offer by Dr. Maureen Ralphs to get balance therapy.  Pt Diabetic: No Pt smoker: non-smoker  Pt meds include: Statin : Yes ASA: Yes Other anticoagulants/antiplatelets: no   Past Medical History  Diagnosis Date  . History of sudden visual loss   . Acute cystitis   . Bronchitis, acute   . Hypertension   . Cerebrovascular disease   . Hypothyroidism   . GERD (gastroesophageal reflux disease)   . Diverticulosis of colon   . DJD (degenerative joint disease)   . Back pain   . Transient ischemic attack   . Anxiety   . Depression   . Asthma   . COPD (chronic obstructive pulmonary disease)   . Shortness of breath     with exertion   . Pneumonia 2015    hx of x 2   . 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 UTI (lower urinary tract infection)     hx of   . Carotid artery occlusion   . Hyperlipidemia   . Aortic stenosis, mild 11/18/2013    Social History History  Substance Use Topics  . Smoking status: Never Smoker   . Smokeless tobacco: Never Used  . Alcohol Use: No    Family History Family History  Problem Relation Age of Onset  .  Emphysema Father   . Heart disease Mother   . Coronary artery disease Sister   . Heart disease Sister   . Rheumatic fever Brother     x2  . Heart attack Brother     Surgical History Past Surgical History  Procedure Laterality Date  . Vaginal hysterectomy    . Sigmoid colectomy    . Tubal ligation    . Replacement total knee      right and left knee  . Cardiothoracic procedure  01/13/2009    Right   . Right foot surgery       nerve cut between toes   . Endartarectomy      right carotid endartarectomy - 2010  . Total knee arthroplasty Left 11/17/2012    Procedure: TOTAL KNEE ARTHROPLASTY;  Surgeon: Gearlean Alf, MD;  Location: [facility];  Service: Orthopedics;  Laterality: Left;  . Tee without cardioversion N/A 02/08/2014    Procedure: TRANSESOPHAGEAL ECHOCARDIOGRAM (TEE);  Surgeon: Larey Dresser, MD;  Location: [facility];  Service: Cardiovascular;  Laterality: N/A;  . Cardiac catheterization  02/18/14    Allergies  Allergen Reactions  . Relafen [Nabumetone] Diarrhea and Other (See Comments)    GI / Urinary Bleeding  . Other Other (See Comments)    "orvail" unknown:  Causes Bloody stool  .  Lipitor [Atorvastatin]     Causes memory loss  . Penicillins Hives, Itching, Swelling and Rash    Current Outpatient Prescriptions  Medication Sig Dispense Refill  . albuterol (PROVENTIL HFA;VENTOLIN HFA) 108 (90 BASE) MCG/ACT inhaler Inhale 2 puffs into the lungs every 6 (six) hours as needed for wheezing or shortness of breath.      Marland Kitchen amLODipine (NORVASC) 5 MG tablet Take 1 tablet (5 mg total) by mouth daily.  30 tablet  6  . aspirin EC 81 MG tablet Take 1 tablet (81 mg total) by mouth daily.  90 tablet  3  . Cholecalciferol (VITAMIN D) 2000 UNITS CAPS Take 1 capsule by mouth daily.      . Cranberry-Vitamin C-Probiotic (AZO CRANBERRY) 250-30 MG TABS Take 1 capsule by mouth daily.      Marland Kitchen escitalopram (LEXAPRO) 10 MG tablet Take 1 tablet (10 mg total) by mouth daily.  90 tablet  3  .  ferrous sulfate 325 (65 FE) MG tablet Take 325 mg by mouth daily with breakfast.      . fish oil-omega-3 fatty acids 1000 MG capsule Take 2 g by mouth daily.      . Fluticasone-Salmeterol (ADVAIR DISKUS) 250-50 MCG/DOSE AEPB Inhale 1 puff into the lungs 2 (two) times daily.  3 each  3  . levothyroxine (SYNTHROID, LEVOTHROID) 88 MCG tablet Take 1 tablet (88 mcg total) by mouth daily.  90 tablet  3  . Nutritional Supplements (OSTEO ADVANCE) TABS Take 2 tablets by mouth daily.       Marland Kitchen omeprazole (PRILOSEC) 20 MG capsule Take 20 mg by mouth daily.      . simvastatin (ZOCOR) 20 MG tablet Take 1 tablet (20 mg total) by mouth at bedtime.  30 tablet  3   No current facility-administered medications for this visit.    Review of Systems : See HPI for pertinent positives and negatives.  Physical Examination  Filed Vitals:   04/19/14 1140 04/19/14 1143  BP: 141/66 122/59  Pulse: 62 61  Resp:  16  Height:  5\' 5"  (1.651 m)  Weight:  180 lb (81.647 kg)  SpO2:  99%   Body mass index is 29.95 kg/(m^2).  General: WDWN female in NAD GAIT: slow and deliberate Eyes: PERRLA Pulmonary:  Non-labored, CTAB, decreased air movement in posterior fields, Negative  Rales, Positive rhonchi, & Negative wheezing.  Cardiac: regular Rhythm ,  Negative detected murmur.  VASCULAR EXAM Carotid Bruits Right Left   Negative Negative    Aorta is not palpable. Radial pulses are 2+ palpable and equal.                                                                                                                            LE Pulses Right Left       POPLITEAL  not palpable   not palpable       POSTERIOR TIBIAL   2+ palpable   2+ palpable  DORSALIS PEDIS      ANTERIOR TIBIAL not palpable  1+ palpable     Gastrointestinal: soft, nontender, BS WNL, no r/g,  negative masses palpated.  Musculoskeletal: Negative muscle atrophy/wasting. M/S 4/5 throughout Extremities without ischemic changes.  Neurologic:  A&O X 3; Appropriate Affect ; SENSATION ;normal;  Speech is normal CN 2-12 intact, Pain and light touch intact in extremities, Motor exam as listed above. Resting tremor in right foot, tremor in right outstretched hand.   Non-Invasive Vascular Imaging CAROTID DUPLEX 04/19/2014   CEREBROVASCULAR DUPLEX EVALUATION    INDICATION: Carotid artery disease    PREVIOUS INTERVENTION(S): Right carotid endarterectomy 01/02/2009    DUPLEX EXAM: Carotid duplex    RIGHT  LEFT  Peak Systolic Velocities (cm/s) End Diastolic Velocities (cm/s) Plaque LOCATION Peak Systolic Velocities (cm/s) End Diastolic Velocities (cm/s) Plaque  73 6 - CCA PROXIMAL 114 18 -  87 11 - CCA MID 82 18 -  77 15 - CCA DISTAL 82 15 HM  84 6 - ECA 60 5 -  78 12 HT ICA PROXIMAL 88 26 HT  72 20 - ICA MID 85 20 -  116 31 - ICA DISTAL 124 33 -    N/A ICA / CCA Ratio (PSV) 1.5  Antegrade Vertebral Flow Antegrade  703 Brachial Systolic Pressure (mmHg) 500  Triphasic Brachial Artery Waveforms Triphasic    Plaque Morphology:  HM = Homogeneous, HT = Heterogeneous, CP = Calcific Plaque, SP = Smooth Plaque, IP = Irregular Plaque     ADDITIONAL FINDINGS:     IMPRESSION: 1. Technically difficult exam due to breathing artifact 2. Patent right carotid endarterectomy site with no evidence for restenosis. 3. Less than 40% left internal carotid artery stenosis.    Compared to the previous exam:  No change.    Assessment: [name] is a 79 y.o. female who is status post right carotid endarterectomy in 2010. Right before her right CEA, she had a right retinal embolus with monocular loss of vision, right arm weakness and trembling, both of the above symptoms remain. She denies right leg weakness, denies expressive aphasia at that time.  She had a TIA about 2012 or 2013 as manifested by confusion and expressive aphasia, denies any further hemiparesis, denies any further loss of vision, denies any further TIA or stroke  activity. Today's carotid Duplex reveals a Technically difficult exam due to breathing artifact, patent right carotid endarterectomy site with no evidence for restenosis, and less than 40% left internal carotid artery stenosis.  The  ICA stenosis is  Unchanged from previous exam.  Plan: Follow-up in 1 year with Carotid Duplex scan.   I discussed in depth with the patient the nature of atherosclerosis, and emphasized the importance of maximal medical management including strict control of blood pressure, blood glucose, and lipid levels, obtaining regular exercise, and continued cessation of smoking.  The patient is aware that without maximal medical management the underlying atherosclerotic disease process will progress, limiting the benefit of any interventions. The patient was given information about stroke prevention and what symptoms should prompt the patient to seek immediate medical care. Thank you for allowing [place] to participate in this patient's care.  Clemon Chambers, RN, MSN, FNP-C [facility] Office: [facility] Physician: Trula Slade  04/19/2014 11:49 AM

## 2014-04-19 NOTE — Patient Instructions (Signed)
Stroke Prevention Some medical conditions and behaviors are associated with an increased chance of having a stroke. You may prevent a stroke by making healthy choices and managing medical conditions. HOW CAN I REDUCE MY RISK OF HAVING A STROKE?   Stay physically active. Get at least 30 minutes of activity on most or all days.  Do not smoke. It may also be helpful to avoid exposure to secondhand smoke.  Limit alcohol use. Moderate alcohol use is considered to be:  No more than 2 drinks per day for men.  No more than 1 drink per day for nonpregnant women.  Eat healthy foods. This involves:  Eating 5 or more servings of fruits and vegetables a day.  Making dietary changes that address high blood pressure (hypertension), high cholesterol, diabetes, or obesity.  Manage your cholesterol levels.  Making food choices that are high in fiber and low in saturated fat, trans fat, and cholesterol may control cholesterol levels.  Take any prescribed medicines to control cholesterol as directed by your health care provider.  Manage your diabetes.  Controlling your carbohydrate and sugar intake is recommended to manage diabetes.  Take any prescribed medicines to control diabetes as directed by your health care provider.  Control your hypertension.  Making food choices that are low in salt (sodium), saturated fat, trans fat, and cholesterol is recommended to manage hypertension.  Take any prescribed medicines to control hypertension as directed by your health care provider.  Maintain a healthy weight.  Reducing calorie intake and making food choices that are low in sodium, saturated fat, trans fat, and cholesterol are recommended to manage weight.  Stop drug abuse.  Avoid taking birth control pills.  Talk to your health care provider about the risks of taking birth control pills if you are over 35 years old, smoke, get migraines, or have ever had a blood clot.  Get evaluated for sleep  disorders (sleep apnea).  Talk to your health care provider about getting a sleep evaluation if you snore a lot or have excessive sleepiness.  Take medicines only as directed by your health care provider.  For some people, aspirin or blood thinners (anticoagulants) are helpful in reducing the risk of forming abnormal blood clots that can lead to stroke. If you have the irregular heart rhythm of atrial fibrillation, you should be on a blood thinner unless there is a good reason you cannot take them.  Understand all your medicine instructions.  Make sure that other conditions (such as anemia or atherosclerosis) are addressed. SEEK IMMEDIATE MEDICAL CARE IF:   You have sudden weakness or numbness of the face, arm, or leg, especially on one side of the body.  Your face or eyelid droops to one side.  You have sudden confusion.  You have trouble speaking (aphasia) or understanding.  You have sudden trouble seeing in one or both eyes.  You have sudden trouble walking.  You have dizziness.  You have a loss of balance or coordination.  You have a sudden, severe headache with no known cause.  You have new chest pain or an irregular heartbeat. Any of these symptoms may represent a serious problem that is an emergency. Do not wait to see if the symptoms will go away. Get medical help at once. Call your local emergency services (911 in [place] Do not drive yourself to the hospital. Document Released: 08/23/2004 Document Revised: 11/30/2013 Document Reviewed: 01/16/2013 ExitCare Patient Information 2015 [employer]. This information is not intended to replace advice given   to you by your health care provider. Make sure you discuss any questions you have with your health care provider.  

## 2014-04-20 ENCOUNTER — Other Ambulatory Visit: Payer: Self-pay | Admitting: Pulmonary Disease

## 2014-05-20 ENCOUNTER — Ambulatory Visit (INDEPENDENT_AMBULATORY_CARE_PROVIDER_SITE_OTHER): Payer: Medicare Other | Admitting: Internal Medicine

## 2014-05-20 ENCOUNTER — Encounter: Payer: Self-pay | Admitting: Internal Medicine

## 2014-05-20 VITALS — BP 140/68 | HR 76 | Temp 98.5°F | Wt 185.2 lb

## 2014-05-20 DIAGNOSIS — I10 Essential (primary) hypertension: Secondary | ICD-10-CM | POA: Diagnosis not present

## 2014-05-20 DIAGNOSIS — E038 Other specified hypothyroidism: Secondary | ICD-10-CM

## 2014-05-20 DIAGNOSIS — R0602 Shortness of breath: Secondary | ICD-10-CM | POA: Diagnosis not present

## 2014-05-20 DIAGNOSIS — I059 Rheumatic mitral valve disease, unspecified: Secondary | ICD-10-CM | POA: Diagnosis not present

## 2014-05-20 DIAGNOSIS — I6529 Occlusion and stenosis of unspecified carotid artery: Secondary | ICD-10-CM

## 2014-05-20 DIAGNOSIS — F329 Major depressive disorder, single episode, unspecified: Secondary | ICD-10-CM

## 2014-05-20 DIAGNOSIS — F32A Depression, unspecified: Secondary | ICD-10-CM

## 2014-05-20 DIAGNOSIS — E785 Hyperlipidemia, unspecified: Secondary | ICD-10-CM | POA: Diagnosis not present

## 2014-05-20 MED ORDER — ESCITALOPRAM OXALATE 10 MG PO TABS: 10.0000 mg | ORAL_TABLET | Freq: Every day | ORAL | Status: DC

## 2014-05-20 MED ORDER — LEVOTHYROXINE SODIUM 88 MCG PO TABS: 88.0000 ug | ORAL_TABLET | Freq: Every day | ORAL | Status: DC

## 2014-05-20 MED ORDER — AMLODIPINE BESYLATE 5 MG PO TABS: 5.0000 mg | ORAL_TABLET | Freq: Every day | ORAL | Status: DC

## 2014-05-20 MED ORDER — OMEPRAZOLE 20 MG PO CPDR: 20.0000 mg | DELAYED_RELEASE_CAPSULE | Freq: Every day | ORAL | Status: DC

## 2014-05-20 MED ORDER — SIMVASTATIN 20 MG PO TABS: 20.0000 mg | ORAL_TABLET | Freq: Every day | ORAL | Status: DC

## 2014-05-20 NOTE — Assessment & Plan Note (Signed)
stable overall by history and exam, recent data reviewed with pt, and pt to continue medical treatment as before,  to f/u any worsening symptoms or concerns BP Readings from Last 3 Encounters:  05/20/14 140/68  04/19/14 122/59  02/23/14 122/66

## 2014-05-20 NOTE — Progress Notes (Signed)
Subjective:    Patient ID: [name], female    DOB: 1934-10-09, 79 y.o.   MRN: [id number]  HPI  Here to f/u; overall doing ok,  Pt denies chest pain, increased sob or doe, wheezing, orthopnea, PND, increased LE swelling, palpitations, dizziness or syncope.  Pt denies polydipsia, polyuria, or low sugar symptoms such as weakness or confusion improved with po intake.  Pt denies new neurological symptoms such as new headache, or facial or extremity weakness or numbness.   Pt states overall good compliance with meds, has been trying to follow lower cholesterol diet, with wt overall stable,  but little exercise however. Denies worsening depressive symptoms, suicidal ideation, or panic; has ongoing anxiety, not increased recently.   Denies hyper or hypo thyroid symptoms such as voice, skin or hair change. No other complaints Past Medical History  Diagnosis Date  . History of sudden visual loss   . Acute cystitis   . Bronchitis, acute   . Hypertension   . Cerebrovascular disease   . Hypothyroidism   . GERD (gastroesophageal reflux disease)   . Diverticulosis of colon   . DJD (degenerative joint disease)   . Back pain   . Transient ischemic attack   . Anxiety   . Depression   . Asthma   . COPD (chronic obstructive pulmonary disease)   . Shortness of breath     with exertion   . Pneumonia 2015    hx of x 2   . 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 UTI (lower urinary tract infection)     hx of   . Carotid artery occlusion   . Hyperlipidemia   . Aortic stenosis, mild 11/18/2013   Past Surgical History  Procedure Laterality Date  . Vaginal hysterectomy    . Sigmoid colectomy    . Tubal ligation    . Replacement total knee      right and left knee  . Cardiothoracic procedure  01/13/2009    Right   . Right foot surgery       nerve cut between toes   . Endartarectomy      right carotid endartarectomy - 2010  . Total knee arthroplasty Left 11/17/2012    Procedure: TOTAL  KNEE ARTHROPLASTY;  Surgeon: Gearlean Alf, MD;  Location: [facility];  Service: Orthopedics;  Laterality: Left;  . Tee without cardioversion N/A 02/08/2014    Procedure: TRANSESOPHAGEAL ECHOCARDIOGRAM (TEE);  Surgeon: Larey Dresser, MD;  Location: [place];  Service: Cardiovascular;  Laterality: N/A;  . Cardiac catheterization  02/18/14    reports that she has never smoked. She has never used smokeless tobacco. She reports that she does not drink alcohol or use illicit drugs. family history includes Coronary artery disease in her sister; Emphysema in her father; Heart attack in her brother; Heart disease in her mother and sister; Rheumatic fever in her brother. Allergies  Allergen Reactions  . Relafen [Nabumetone] Diarrhea and Other (See Comments)    GI / Urinary Bleeding  . Other Other (See Comments)    "orvail" unknown:  Causes Bloody stool  . Lipitor [Atorvastatin]     Causes memory loss  . Penicillins Hives, Itching, Swelling and Rash   Current Outpatient Prescriptions on File Prior to Visit  Medication Sig Dispense Refill  . albuterol (PROVENTIL HFA;VENTOLIN HFA) 108 (90 BASE) MCG/ACT inhaler Inhale 2 puffs into the lungs every 6 (six) hours as needed for wheezing or shortness of  breath.      Marland Kitchen aspirin EC 81 MG tablet Take 1 tablet (81 mg total) by mouth daily.  90 tablet  3  . Cholecalciferol (VITAMIN D) 2000 UNITS CAPS Take 1 capsule by mouth daily.      . Cranberry-Vitamin C-Probiotic (AZO CRANBERRY) 250-30 MG TABS Take 1 capsule by mouth daily.      . ferrous sulfate 325 (65 FE) MG tablet Take 325 mg by mouth daily with breakfast.      . fish oil-omega-3 fatty acids 1000 MG capsule Take 2 g by mouth daily.      . Fluticasone-Salmeterol (ADVAIR DISKUS) 250-50 MCG/DOSE AEPB Inhale 1 puff into the lungs 2 (two) times daily.  3 each  3  . Nutritional Supplements (OSTEO ADVANCE) TABS Take 2 tablets by mouth daily.        No current facility-administered medications on file prior to  visit.   Review of Systems  Constitutional: Negative for unusual diaphoresis or other sweats  HENT: Negative for ringing in ear Eyes: Negative for double vision or worsening visual disturbance.  Respiratory: Negative for choking and stridor.   Gastrointestinal: Negative for vomiting or other signifcant bowel change Genitourinary: Negative for hematuria or decreased urine volume.  Musculoskeletal: Negative for other MSK pain or swelling Skin: Negative for color change and worsening wound.  Neurological: Negative for tremors and numbness other than noted  Psychiatric/Behavioral: Negative for decreased concentration or agitation other than above       Objective:   Physical Exam BP 140/68  Pulse 76  Temp(Src) 98.5 F (36.9 C) (Oral)  Wt 185 lb 4 oz (84.029 kg)  SpO2 93% VS noted,  Constitutional: Pt appears well-developed, well-nourished.  HENT: Head: NCAT.  Right Ear: External ear normal.  Left Ear: External ear normal.  Eyes: . Pupils are equal, round, and reactive to light. Conjunctivae and EOM are normal Neck: Normal range of motion. Neck supple.  Cardiovascular: Normal rate and regular rhythm.   Pulmonary/Chest: Effort normal and breath sounds normal.  Neurological: Pt is alert. Not confused , motor grossly intact Skin: Skin is warm. No rash Psychiatric: Pt behavior is normal. No agitation. mild nervous, not depressed affect    Assessment & Plan:

## 2014-05-20 NOTE — Assessment & Plan Note (Signed)
stable overall by history and exam, recent data reviewed with pt, and pt to continue medical treatment as before,  to f/u any worsening symptoms or concerns Lab Results  Component Value Date   WBC 10.0 02/23/2014   HGB 10.7* 02/23/2014   HCT 31.7* 02/23/2014   PLT 231.0 02/23/2014   GLUCOSE 88 03/08/2014   CHOL 193 11/18/2013   TRIG 64.0 11/18/2013   HDL 77.50 11/18/2013   LDLDIRECT 103.1 10/10/2011   LDLCALC 103* 11/18/2013   ALT 14 01/06/2014   AST 14 01/06/2014   NA 144 03/08/2014   K 4.0 03/08/2014   CL 106 03/08/2014   CREATININE 1.2 03/08/2014   BUN 19 03/08/2014   CO2 28 03/08/2014   TSH 0.19* 11/18/2013   INR 1.0 02/05/2014

## 2014-05-20 NOTE — Progress Notes (Signed)
Pre visit review using our clinic review tool, if applicable. No additional management support is needed unless otherwise documented below in the visit note. 

## 2014-05-20 NOTE — Patient Instructions (Signed)
Please continue all other medications as before, and refills have been done if requested.  Please have the pharmacy call with any other refills you may need.  Please continue your efforts at being more active, low cholesterol diet, and weight control.  You are otherwise up to date with prevention measures today.  Please keep your appointments with your specialists as you may have planned  We can hold off on lab work today  Please return in 6 months, or sooner if needed

## 2014-05-20 NOTE — Assessment & Plan Note (Addendum)
stable overall by history and exam, recent data reviewed with pt, and pt to continue medical treatment as before,  to f/u any worsening symptoms or concerns Lab Results  Component Value Date   TSH 0.19* 11/18/2013  med adjusted to 88 mcg, doing well, delcines f/u lab today, will ask next visit

## 2014-05-20 NOTE — Assessment & Plan Note (Signed)
stable overall by history and exam, recent data reviewed with pt, and pt to continue medical treatment as before,  to f/u any worsening symptoms or concerns Lab Results  Component Value Date   LDLCALC 103* 11/18/2013

## 2014-05-21 ENCOUNTER — Telehealth: Payer: Self-pay | Admitting: Internal Medicine

## 2014-05-21 NOTE — Telephone Encounter (Signed)
emmi mailed  °

## 2014-06-16 ENCOUNTER — Ambulatory Visit: Payer: Medicare Other | Admitting: Neurology

## 2014-07-08 ENCOUNTER — Encounter (HOSPITAL_COMMUNITY): Payer: Self-pay | Admitting: Cardiology

## 2014-07-19 ENCOUNTER — Encounter (HOSPITAL_COMMUNITY): Payer: Medicare Other

## 2014-07-19 ENCOUNTER — Ambulatory Visit: Payer: Medicare Other | Admitting: Surgery

## 2014-07-19 ENCOUNTER — Other Ambulatory Visit (HOSPITAL_COMMUNITY): Payer: Medicare Other

## 2014-08-06 ENCOUNTER — Telehealth: Payer: Self-pay | Admitting: Pulmonary Disease

## 2014-08-06 MED ORDER — AZITHROMYCIN 250 MG PO TABS: ORAL_TABLET | ORAL | Status: DC

## 2014-08-06 NOTE — Telephone Encounter (Signed)
Called and spoke with pt and she is calling wanting a zpak to be sent in for her cough and congestion.  She stated that she is not able to cough anything up at this time.  Has been going on for 4 days.  Denies any fever.  SN please advise of recs for the pt.  Thanks  Allergies  Allergen Reactions  . Relafen [Nabumetone] Diarrhea and Other (See Comments)    GI / Urinary Bleeding  . Other Other (See Comments)    "orvail" unknown:  Causes Bloody stool  . Lipitor [Atorvastatin]     Causes memory loss  . Penicillins Hives, Itching, Swelling and Rash    Current Outpatient Prescriptions on File Prior to Visit  Medication Sig Dispense Refill  . albuterol (PROVENTIL HFA;VENTOLIN HFA) 108 (90 BASE) MCG/ACT inhaler Inhale 2 puffs into the lungs every 6 (six) hours as needed for wheezing or shortness of breath.    Marland Kitchen amLODipine (NORVASC) 5 MG tablet Take 1 tablet (5 mg total) by mouth daily. 90 tablet 3  . aspirin EC 81 MG tablet Take 1 tablet (81 mg total) by mouth daily. 90 tablet 3  . Cholecalciferol (VITAMIN D) 2000 UNITS CAPS Take 1 capsule by mouth daily.    . Cranberry-Vitamin C-Probiotic (AZO CRANBERRY) 250-30 MG TABS Take 1 capsule by mouth daily.    Marland Kitchen escitalopram (LEXAPRO) 10 MG tablet Take 1 tablet (10 mg total) by mouth daily. 90 tablet 3  . ferrous sulfate 325 (65 FE) MG tablet Take 325 mg by mouth daily with breakfast.    . fish oil-omega-3 fatty acids 1000 MG capsule Take 2 g by mouth daily.    . Fluticasone-Salmeterol (ADVAIR DISKUS) 250-50 MCG/DOSE AEPB Inhale 1 puff into the lungs 2 (two) times daily. 3 each 3  . levothyroxine (SYNTHROID, LEVOTHROID) 88 MCG tablet Take 1 tablet (88 mcg total) by mouth daily. 90 tablet 3  . Nutritional Supplements (OSTEO ADVANCE) TABS Take 2 tablets by mouth daily.     Marland Kitchen omeprazole (PRILOSEC) 20 MG capsule Take 1 capsule (20 mg total) by mouth daily. 90 capsule 3  . simvastatin (ZOCOR) 20 MG tablet Take 1 tablet (20 mg total) by mouth at bedtime. 90  tablet 3   No current facility-administered medications on file prior to visit.

## 2014-08-06 NOTE — Telephone Encounter (Signed)
Per SN---  Ok to give the pt zpak #1  Take as directed Delsym prn for cough mucinex  600mg   1 po QID with plenty of fluids.    Called and spoke with pt and she is aware of SN recs.  Nothing further is needed.

## 2014-08-12 ENCOUNTER — Ambulatory Visit (INDEPENDENT_AMBULATORY_CARE_PROVIDER_SITE_OTHER): Payer: Medicare Other | Admitting: Internal Medicine

## 2014-08-12 ENCOUNTER — Encounter: Payer: Self-pay | Admitting: Internal Medicine

## 2014-08-12 VITALS — BP 134/80 | HR 76 | Temp 98.2°F | Ht 65.0 in | Wt 182.0 lb

## 2014-08-12 DIAGNOSIS — J209 Acute bronchitis, unspecified: Secondary | ICD-10-CM | POA: Diagnosis not present

## 2014-08-12 DIAGNOSIS — J4541 Moderate persistent asthma with (acute) exacerbation: Secondary | ICD-10-CM | POA: Diagnosis not present

## 2014-08-12 DIAGNOSIS — I499 Cardiac arrhythmia, unspecified: Secondary | ICD-10-CM | POA: Diagnosis not present

## 2014-08-12 DIAGNOSIS — I10 Essential (primary) hypertension: Secondary | ICD-10-CM | POA: Diagnosis not present

## 2014-08-12 MED ORDER — METHYLPREDNISOLONE ACETATE 80 MG/ML IJ SUSP
80.0000 mg | Freq: Once | INTRAMUSCULAR | Status: AC
Start: 2014-08-12 — End: 2014-08-12
  Administered 2014-08-12: 80 mg via INTRAMUSCULAR

## 2014-08-12 MED ORDER — OMEPRAZOLE 20 MG PO CPDR: 20.0000 mg | DELAYED_RELEASE_CAPSULE | Freq: Every day | ORAL | Status: DC

## 2014-08-12 MED ORDER — HYDROCODONE-HOMATROPINE 5-1.5 MG/5ML PO SYRP: 5.0000 mL | ORAL_SOLUTION | Freq: Four times a day (QID) | ORAL | Status: DC | PRN

## 2014-08-12 NOTE — Progress Notes (Signed)
Subjective:    Patient ID: [name], female    DOB: 09-01-1934, 80 y.o.   MRN: [id number]  HPI Here with acute onset mild to mod 2-3 wks ST, HA, general weakness and malaise, with prod cough greenish sputum, but Pt denies chest pain, increased sob or doe, orthopnea, PND, increased LE swelling, palpitations, dizziness or syncope. Some better with recent zpack, but still coughing, and mild wheeze. Has some dizziness with lying down, mouth dry as well.  Unaware of any irreg heart beat today.  Had thyroid issue recently Past Medical History  Diagnosis Date  . History of sudden visual loss   . Acute cystitis   . Bronchitis, acute   . Hypertension   . Cerebrovascular disease   . Hypothyroidism   . GERD (gastroesophageal reflux disease)   . Diverticulosis of colon   . DJD (degenerative joint disease)   . Back pain   . Transient ischemic attack   . Anxiety   . Depression   . Asthma   . COPD (chronic obstructive pulmonary disease)   . Shortness of breath     with exertion   . Pneumonia 2015    hx of x 2   . 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 UTI (lower urinary tract infection)     hx of   . Carotid artery occlusion   . Hyperlipidemia   . Aortic stenosis, mild 11/18/2013   Past Surgical History  Procedure Laterality Date  . Vaginal hysterectomy    . Sigmoid colectomy    . Tubal ligation    . Replacement total knee      right and left knee  . Cardiothoracic procedure  01/13/2009    Right   . Right foot surgery       nerve cut between toes   . Endartarectomy      right carotid endartarectomy - 2010  . Total knee arthroplasty Left 11/17/2012    Procedure: TOTAL KNEE ARTHROPLASTY;  Surgeon: Gearlean Alf, MD;  Location: [facility];  Service: Orthopedics;  Laterality: Left;  . Tee without cardioversion N/A 02/08/2014    Procedure: TRANSESOPHAGEAL ECHOCARDIOGRAM (TEE);  Surgeon: Larey Dresser, MD;  Location: [facility];  Service: Cardiovascular;  Laterality: N/A;  .  Cardiac catheterization  02/18/14  . Left and right heart catheterization with coronary angiogram N/A 02/18/2014    Procedure: LEFT AND RIGHT HEART CATHETERIZATION WITH CORONARY ANGIOGRAM;  Surgeon: Larey Dresser, MD;  Location: [facility] C[facility];  Service: Cardiovascular;  Laterality: N/A;    reports that she has never smoked. She has never used smokeless tobacco. She reports that she does not drink alcohol or use illicit drugs. family history includes Coronary artery disease in her sister; Emphysema in her father; Heart attack in her brother; Heart disease in her mother and sister; Rheumatic fever in her brother. Allergies  Allergen Reactions  . Relafen [Nabumetone] Diarrhea and Other (See Comments)    GI / Urinary Bleeding  . Other Other (See Comments)    "orvail" unknown:  Causes Bloody stool  . Lipitor [Atorvastatin]     Causes memory loss  . Penicillins Hives, Itching, Swelling and Rash   Current Outpatient Prescriptions on File Prior to Visit  Medication Sig Dispense Refill  . albuterol (PROVENTIL HFA;VENTOLIN HFA) 108 (90 BASE) MCG/ACT inhaler Inhale 2 puffs into the lungs every 6 (six) hours as needed for wheezing or shortness of breath.    Marland Kitchen amLODipine (  NORVASC) 5 MG tablet Take 1 tablet (5 mg total) by mouth daily. 90 tablet 3  . aspirin EC 81 MG tablet Take 1 tablet (81 mg total) by mouth daily. 90 tablet 3  . Cholecalciferol (VITAMIN D) 2000 UNITS CAPS Take 1 capsule by mouth daily.    . Cranberry-Vitamin C-Probiotic (AZO CRANBERRY) 250-30 MG TABS Take 1 capsule by mouth daily.    Marland Kitchen escitalopram (LEXAPRO) 10 MG tablet Take 1 tablet (10 mg total) by mouth daily. 90 tablet 3  . ferrous sulfate 325 (65 FE) MG tablet Take 325 mg by mouth daily with breakfast.    . fish oil-omega-3 fatty acids 1000 MG capsule Take 2 g by mouth daily.    . Fluticasone-Salmeterol (ADVAIR DISKUS) 250-50 MCG/DOSE AEPB Inhale 1 puff into the lungs 2 (two) times daily. 3 each 3  . levothyroxine (SYNTHROID,  LEVOTHROID) 88 MCG tablet Take 1 tablet (88 mcg total) by mouth daily. 90 tablet 3  . Nutritional Supplements (OSTEO ADVANCE) TABS Take 2 tablets by mouth daily.     Marland Kitchen omeprazole (PRILOSEC) 20 MG capsule Take 1 capsule (20 mg total) by mouth daily. 90 capsule 3  . simvastatin (ZOCOR) 20 MG tablet Take 1 tablet (20 mg total) by mouth at bedtime. 90 tablet 3   No current facility-administered medications on file prior to visit.   Review of Systems  Constitutional: Negative for unusual diaphoresis or other sweats  HENT: Negative for ringing in ear Eyes: Negative for double vision or worsening visual disturbance.  Respiratory: Negative for choking and stridor.   Gastrointestinal: Negative for vomiting or other signifcant bowel change Genitourinary: Negative for hematuria or decreased urine volume.  Musculoskeletal: Negative for other MSK pain or swelling Skin: Negative for color change and worsening wound.  Neurological: Negative for tremors and numbness other than noted  Psychiatric/Behavioral: Negative for decreased concentration or agitation other than above       Objective:   Physical Exam BP 134/80 mmHg  Pulse 76  Temp(Src) 98.2 F (36.8 C) (Oral)  Ht 5\' 5"  (1.651 m)  Wt 182 lb (82.555 kg)  BMI 30.29 kg/m2  SpO2 93% VS noted, mild ill Constitutional: Pt appears well-developed, well-nourished.  HENT: Head: NCAT.  Right Ear: External ear normal.  Left Ear: External ear normal.  Eyes: . Pupils are equal, round, and reactive to light. Conjunctivae and EOM are normal Bilat tm's with mild erythema.  Max sinus areas non tender.  Pharynx with mild erythema, no exudate Neck: Normal range of motion. Neck supple.  Cardiovascular: Normal rate and IRR irregular rhythm.   Pulmonary/Chest: Effort normal and breath sounds without rales , but decr bilat, few wheeze bilat Abd:  Soft, NT, ND, + BS Neurological: Pt is alert. Not confused , motor grossly intact Skin: Skin is warm. No  rash Psychiatric: Pt behavior is normal. No agitation. mild nervous      Assessment & Plan:

## 2014-08-12 NOTE — Assessment & Plan Note (Addendum)
ECG reviewed as per emr, pt with apparent benign ectopy noted on ausculatation today,  to f/u any worsening symptoms or concerns

## 2014-08-12 NOTE — Patient Instructions (Addendum)
You had the steroid shot today  Your EKG was OK today  Please continue all other medications as before, and refills have been done if requested - the cough medicine  Please have the pharmacy call with any other refills you may need.  Please keep your appointments with your specialists as you may have planned  Please go to the LAB in the Basement (turn left off the elevator) for the tests to be done today  You will be contacted by phone if any changes need to be made immediately.  Otherwise, you will receive a letter about your results with an explanation, but please check with MyChart first.  Please remember to sign up for MyChart if you have not done so, as this will be important to you in the future with finding out test results, communicating by private email, and scheduling acute appointments online when needed.  Please return in 6 months, or sooner if needed

## 2014-08-12 NOTE — Progress Notes (Signed)
Pre visit review using our clinic review tool, if applicable. No additional management support is needed unless otherwise documented below in the visit note. 

## 2014-08-15 NOTE — Assessment & Plan Note (Signed)
Mild to mod, for antibx course,  to f/u any worsening symptoms or concerns 

## 2014-08-15 NOTE — Assessment & Plan Note (Signed)
Mild to mod, for depomedrol IM, predpac asd, to f/u any worsening symptoms or concerns 

## 2014-08-15 NOTE — Assessment & Plan Note (Signed)
stable overall by history and exam, recent data reviewed with pt, and pt to continue medical treatment as before,  to f/u any worsening symptoms or concerns BP Readings from Last 3 Encounters:  08/12/14 134/80  05/20/14 140/68  04/19/14 122/59

## 2014-08-23 ENCOUNTER — Ambulatory Visit: Payer: Medicare Other | Admitting: Cardiology

## 2014-08-27 ENCOUNTER — Other Ambulatory Visit: Payer: Self-pay | Admitting: Cardiology

## 2014-09-12 ENCOUNTER — Other Ambulatory Visit: Payer: Self-pay | Admitting: Cardiology

## 2014-09-14 ENCOUNTER — Ambulatory Visit (INDEPENDENT_AMBULATORY_CARE_PROVIDER_SITE_OTHER): Payer: Medicare Other | Admitting: Adult Health

## 2014-09-14 ENCOUNTER — Encounter: Payer: Self-pay | Admitting: Adult Health

## 2014-09-14 VITALS — BP 148/72 | HR 77 | Ht 66.0 in | Wt 179.0 lb

## 2014-09-14 DIAGNOSIS — R251 Tremor, unspecified: Secondary | ICD-10-CM

## 2014-09-14 DIAGNOSIS — R296 Repeated falls: Secondary | ICD-10-CM | POA: Diagnosis not present

## 2014-09-14 DIAGNOSIS — Z8673 Personal history of transient ischemic attack (TIA), and cerebral infarction without residual deficits: Secondary | ICD-10-CM | POA: Diagnosis not present

## 2014-09-14 NOTE — Progress Notes (Signed)
PATIENT: [name] DOB: 05-18-1935  REASON FOR VISIT: follow up- TIA, tremor HISTORY FROM: patient  HISTORY OF PRESENT ILLNESS: [name] is a 80 year old female with a history of TIA event and resting tremor. She returns today for follow-up. She is currently taking aspirin for stroke prevention. Her BP is controlled.Today it is 148/72. Her last LDL was 103, 1 year ago. She is currently taking a statin to help control her cholesterol. She has a  tremor that has gotten worse. She states that it is primarily in the right hand but is now moving to the left. She states that if she tries to eat she  has a hard time. According to the notes the patient has tried taking xanax but she does not recall this. She states that anything she does with her hands causes her some issues due to the tremor. Patient states that she fell last Wednesday. She states that she lost her balance and fell but did not suffer any injuries. She does not use an assistive device. However she states that she is looking to get a Rolator. She does have a walker at home that she uses when she gets up at night to go to the bathroom. In the last six month she has fallen 6-7 times. She states that her balance was affected after having the stroke. She denies any stroke like symptoms. She continues with the Lexapro and feels that it has offered good benefit. Patient was also in pulmonary rehab for her asthma. She is SOB while speaking with me.   HISTORY: PRIOR HPI (PS): 60 year Caucasian lady who had a transient episode of sudden onset dysarthria, left facial droop and drooling of saliva from the left corner of mouth, lasting 30 minutes on 01/03/13. She had stopped her aspirin recently for her knee surgery which has since been restarted after the episode. She went to [facility] emergency room and had a CT scan of the head which was unremarkable. She was advised admission and TIA workup t but the patient refused both. She does have a history  of stroke 4 years ago when she lost partial vision in the right eye and was found to have symptomatic carotid stenosis for which he underwent carotid endarterectomy. She is followed by Dr. Kellie Simmering and states her last carotid ultrasound done 2 months ago had shown some mild left ICA stenosis but I do not have those results. She was also on Lipitor which she stopped 6 days ago for unclear reasons. She states her blood pressure is under good control. She does not smoke.   UPDATE 02/19/13 (LL): Patient returns to office for follow up, no neurologic complaints or episodes since last TIA on 01/03/13. She has stopped taking Lipitor due to side effect profile and she thinks her memory is worse since starting it. Her MRA neck showed no significant stenosis at either carotid bifurcation. MRI shows chronic microvascular changes. Her husband has been diagnosed with Alzheimer's and she is tearful talking about it. She is no longer taking Xarelto for DVT prophylaxis after knee replacement, has gone back to 81 mg ASA.   UPDATE 08/25/13 (LL): She returns for follow up for TIA. She has had no recurrent TIA symptoms and has been feeling well. Since last visit she has developed a tremor in right hand, which is bothersome when she eats. She notices tremor in her right foot as well. She denies excessive caffeine use, only 1 cup in the am. She endorses  a high level of stress and does not sleep well.   UPDATE 12/04/13 (LL):  Since last visit, she has established care with Dr. Cathlean Cower as her PCP, and she continues to see Dr. Lenna Gilford for asthma.  Dr. Jenny Reichmann started her on Lexapro for depression, she states she feels so much better, it has made a big difference. GDS score today is 4. The Xanax did not help her tremor.  Her synthroid dose was decreased recently but she has not started to take the lower dose yet. She has not had any recurrent TIAs. She is tolerating daily aspirin well without any significant bruising.  REVIEW OF SYSTEMS:  Out of a complete 14 system review of symptoms, the patient complains only of the following symptoms, and all other reviewed systems are negative.  Ringing in ears, wheezing, shortness of breath, walking difficulty, neck pain, memory loss, dizziness, headache, weakness, tremors, anemia  ALLERGIES: Allergies  Allergen Reactions  . Relafen [Nabumetone] Diarrhea and Other (See Comments)    GI / Urinary Bleeding  . Other Other (See Comments)    "orvail" unknown:  Causes Bloody stool  . Lipitor [Atorvastatin]     Causes memory loss  . Penicillins Hives, Itching, Swelling and Rash    HOME MEDICATIONS: Outpatient Prescriptions Prior to Visit  Medication Sig Dispense Refill  . albuterol (PROVENTIL HFA;VENTOLIN HFA) 108 (90 BASE) MCG/ACT inhaler Inhale 2 puffs into the lungs every 6 (six) hours as needed for wheezing or shortness of breath.    Marland Kitchen amLODipine (NORVASC) 5 MG tablet Take 1 tablet (5 mg total) by mouth daily. 90 tablet 3  . amLODipine (NORVASC) 5 MG tablet TAKE 1 TABLET BY MOUTH EVERY DAY 30 tablet 6  . aspirin EC 81 MG tablet Take 1 tablet (81 mg total) by mouth daily. 90 tablet 3  . Cholecalciferol (VITAMIN D) 2000 UNITS CAPS Take 1 capsule by mouth daily.    . Cranberry-Vitamin C-Probiotic (AZO CRANBERRY) 250-30 MG TABS Take 1 capsule by mouth daily.    Marland Kitchen escitalopram (LEXAPRO) 10 MG tablet Take 1 tablet (10 mg total) by mouth daily. 90 tablet 3  . ferrous sulfate 325 (65 FE) MG tablet Take 325 mg by mouth daily with breakfast.    . fish oil-omega-3 fatty acids 1000 MG capsule Take 2 g by mouth daily.    . Fluticasone-Salmeterol (ADVAIR DISKUS) 250-50 MCG/DOSE AEPB Inhale 1 puff into the lungs 2 (two) times daily. 3 each 3  . HYDROcodone-homatropine (HYCODAN) 5-1.5 MG/5ML syrup Take 5 mLs by mouth every 6 (six) hours as needed for cough. 180 mL 0  . levothyroxine (SYNTHROID, LEVOTHROID) 88 MCG tablet Take 1 tablet (88 mcg total) by mouth daily. 90 tablet 3  . Nutritional  Supplements (OSTEO ADVANCE) TABS Take 2 tablets by mouth daily.     Marland Kitchen omeprazole (PRILOSEC) 20 MG capsule Take 1 capsule (20 mg total) by mouth daily. 90 capsule 3  . simvastatin (ZOCOR) 20 MG tablet Take 1 tablet (20 mg total) by mouth at bedtime. 90 tablet 3  . simvastatin (ZOCOR) 20 MG tablet TAKE 1 TABLET (20 MG TOTAL) BY MOUTH AT BEDTIME. 30 tablet 3   No facility-administered medications prior to visit.    PAST MEDICAL HISTORY: Past Medical History  Diagnosis Date  . History of sudden visual loss   . Acute cystitis   . Bronchitis, acute   . Hypertension   . Cerebrovascular disease   . Hypothyroidism   . GERD (gastroesophageal reflux disease)   .  Diverticulosis of colon   . DJD (degenerative joint disease)   . Back pain   . Transient ischemic attack   . Anxiety   . Depression   . Asthma   . COPD (chronic obstructive pulmonary disease)   . Shortness of breath     with exertion   . Pneumonia 2015    hx of x 2   . 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 UTI (lower urinary tract infection)     hx of   . Carotid artery occlusion   . Hyperlipidemia   . Aortic stenosis, mild 11/18/2013    PAST SURGICAL HISTORY: Past Surgical History  Procedure Laterality Date  . Vaginal hysterectomy    . Sigmoid colectomy    . Tubal ligation    . Replacement total knee      right and left knee  . Cardiothoracic procedure  01/13/2009    Right   . Right foot surgery       nerve cut between toes   . Endartarectomy      right carotid endartarectomy - 2010  . Total knee arthroplasty Left 11/17/2012    Procedure: TOTAL KNEE ARTHROPLASTY;  Surgeon: Gearlean Alf, MD;  Location: [facility];  Service: Orthopedics;  Laterality: Left;  . Tee without cardioversion N/A 02/08/2014    Procedure: TRANSESOPHAGEAL ECHOCARDIOGRAM (TEE);  Surgeon: Larey Dresser, MD;  Location: [place];  Service: Cardiovascular;  Laterality: N/A;  . Cardiac catheterization  02/18/14  . Left and right heart  catheterization with coronary angiogram N/A 02/18/2014    Procedure: LEFT AND RIGHT HEART CATHETERIZATION WITH CORONARY ANGIOGRAM;  Surgeon: Larey Dresser, MD;  Location: [facility]ATH [facility];  Service: Cardiovascular;  Laterality: N/A;    FAMILY HISTORY: Family History  Problem Relation Age of Onset  . Emphysema Father   . Heart disease Mother   . Coronary artery disease Sister   . Heart disease Sister   . Rheumatic fever Brother     x2  . Heart attack Brother     SOCIAL HISTORY: History   Social History  . Marital Status: Married    Spouse Name: Journalist, newspaper  . Number of Children: 3  . Years of Education: N/A   Occupational History  . Not on file.   Social History Main Topics  . Smoking status: Never Smoker   . Smokeless tobacco: Never Used  . Alcohol Use: No  . Drug Use: No  . Sexual Activity: Not on file   Other Topics Concern  . Not on file   Social History Narrative      PHYSICAL EXAM  Filed Vitals:   09/14/14 0919  BP: 148/72  Pulse: 77  Height: 5\' 6"  (1.676 m)  Weight: 179 lb (81.194 kg)   Body mass index is 28.91 kg/(m^2).  Generalized: Well developed, in no acute distress  Chest: SOB noted with conversations and with minimal activity.  Neurological examination  Mentation: Alert oriented to time, place, history taking. Follows all commands speech and language fluent Cranial nerve II-XII: Pupils were equal round reactive to light. Extraocular movements were full, visual field were full on confrontational test. Facial sensation and strength were normal. Uvula tongue midline. Head turning and shoulder shrug  were normal and symmetric. Motor: The motor testing reveals 5 over 5 strength of all 4 extremities. Good symmetric motor tone is noted throughout. Mld resting tremor in right hand. Intention tremor in right and left hand--right>left. Sensory:  Sensory testing is intact to soft touch on all 4 extremities. No evidence of extinction is noted.  Coordination:  Cerebellar testing reveals good finger-nose-finger and heel-to-shin bilaterally.  Gait and station: Patient has decreased arm swing when ambulating. No tremor noted while ambulating. Moderate stride. Tandem gait not attempted. Romberg is negative. No drift is seen.  Reflexes: Deep tendon reflexes are symmetric and normal bilaterally.    DIAGNOSTIC DATA (LABS, IMAGING, TESTING) - I reviewed patient records, labs, notes, testing and imaging myself where available.  Lab Results  Component Value Date   WBC 10.0 02/23/2014   HGB 10.7* 02/23/2014   HCT 31.7* 02/23/2014   MCV 99.0 02/23/2014   PLT 231.0 02/23/2014      Component Value Date/Time   NA 144 03/08/2014 0746   K 4.0 03/08/2014 0746   CL 106 03/08/2014 0746   CO2 28 03/08/2014 0746   GLUCOSE 88 03/08/2014 0746   BUN 19 03/08/2014 0746   CREATININE 1.2 03/08/2014 0746   CALCIUM 9.4 03/08/2014 0746   PROT 6.8 01/06/2014 1415   ALBUMIN 3.4* 01/06/2014 1415   AST 14 01/06/2014 1415   ALT 14 01/06/2014 1415   ALKPHOS 68 01/06/2014 1415   BILITOT 0.5 01/06/2014 1415   GFRNONAA 27* 02/08/2014 1000   GFRAA 32* 02/08/2014 1000   Lab Results  Component Value Date   CHOL 193 11/18/2013   HDL 77.50 11/18/2013   LDLCALC 103* 11/18/2013   LDLDIRECT 103.1 10/10/2011   TRIG 64.0 11/18/2013   CHOLHDL 2 11/18/2013   No results found for: HGBA1C No results found for: VITAMINB12 Lab Results  Component Value Date   TSH 0.19* 11/18/2013      ASSESSMENT AND PLAN 80 y.o. year old female  has a past medical history of History of sudden visual loss; Acute cystitis; Bronchitis, acute; Hypertension; Cerebrovascular disease; Hypothyroidism; GERD (gastroesophageal reflux disease); Diverticulosis of colon; DJD (degenerative joint disease); Back pain; Transient ischemic attack; Anxiety; Depression; Asthma; COPD (chronic obstructive pulmonary disease); Shortness of breath; Pneumonia (2015); Stroke; UTI (lower urinary tract infection); Carotid  artery occlusion; Hyperlipidemia; and Aortic stenosis, mild (11/18/2013). here with:  1. History of TIA 2. Tremor 3. Frequent falls  Continue aspirin 325 mg for stroke prevention and strict control of hypertension with blood pressure goal below 130/90 and  lipids with LDL cholesterol goal below 100 mg percent. Continue Zocor as tolerated. Patient has had 6-7 falls in the last 6 months. I will refer the patient for physical therapy. She was also in pulmonary rehab- patient still becomes SOB with activity and during conversations. I have recommended that she follow-up with her pulmonologist. For now we will not start the patient on any medication for the tremor. She does not want to start mysoline. Xanax was considered but would rather the patient's gait be more stable to reduce the chance of falling. She will f/u in 6 months or sooner if needed.      Ward Givens, MSN, NP-C 09/14/2014, 9:50 AM [facility] [address] [place], [place] [address] [phone]  Note: This document was prepared with digital dictation and possible smart phrase technology. Any transcriptional errors that result from this process are unintentional.

## 2014-09-14 NOTE — Patient Instructions (Signed)
Continue aspirin 325 mg for stroke prevention and strict control of hypertension with blood pressure goal below 130/90 and elevated lipids with LDL cholesterol goal below 100 mg percent. Continue Zocor as tolerated. I will put a referral in for physical therapy.

## 2014-09-15 ENCOUNTER — Ambulatory Visit: Payer: Medicare Other | Admitting: Neurology

## 2014-09-17 NOTE — Progress Notes (Signed)
I agree with the above plan 

## 2014-09-29 ENCOUNTER — Ambulatory Visit: Payer: Medicare Other | Attending: Adult Health

## 2014-09-29 DIAGNOSIS — R269 Unspecified abnormalities of gait and mobility: Secondary | ICD-10-CM | POA: Diagnosis not present

## 2014-09-29 DIAGNOSIS — J449 Chronic obstructive pulmonary disease, unspecified: Secondary | ICD-10-CM | POA: Insufficient documentation

## 2014-09-29 DIAGNOSIS — R5381 Other malaise: Secondary | ICD-10-CM | POA: Diagnosis not present

## 2014-09-29 DIAGNOSIS — I10 Essential (primary) hypertension: Secondary | ICD-10-CM | POA: Insufficient documentation

## 2014-09-29 NOTE — Therapy (Signed)
[facility] [address] [place], [place], [address] Phone: [phone]   Fax:  [phone]  Physical Therapy Evaluation  Patient Details  Name: [name] MRN: [id number] Date of Birth: 05-Aug-1934 Referring Provider:  Ward Givens, NP  Encounter Date: 09/29/2014      PT End of Session - 09/29/14 1306    Visit Number 1   Number of Visits 17   Date for PT Re-Evaluation 11/28/14   Authorization Type G-code every 10th visit and [employer]   PT Start Time 1100   PT Stop Time 1140   PT Time Calculation (min) 40 min   Equipment Utilized During Treatment Gait belt   Activity Tolerance Other (comment);Patient limited by fatigue  limited by SOB most likely due to anxiety, as pt's SaO2 on room air was >93%.   Behavior During Therapy Anxious      Past Medical History  Diagnosis Date  . History of sudden visual loss   . Acute cystitis   . Bronchitis, acute   . Hypertension   . Cerebrovascular disease   . Hypothyroidism   . GERD (gastroesophageal reflux disease)   . Diverticulosis of colon   . DJD (degenerative joint disease)   . Back pain   . Transient ischemic attack   . Anxiety   . Depression   . Asthma   . COPD (chronic obstructive pulmonary disease)   . Shortness of breath     with exertion   . Pneumonia 2015    hx of x 2   . 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 UTI (lower urinary tract infection)     hx of   . Carotid artery occlusion   . Hyperlipidemia   . Aortic stenosis, mild 11/18/2013    Past Surgical History  Procedure Laterality Date  . Vaginal hysterectomy    . Sigmoid colectomy    . Tubal ligation    . Replacement total knee      right and left knee  . Cardiothoracic procedure  01/13/2009    Right   . Right foot surgery       nerve cut between toes   . Endartarectomy      right carotid endartarectomy - 2010  . Total knee arthroplasty Left 11/17/2012    Procedure: TOTAL KNEE  ARTHROPLASTY;  Surgeon: Gearlean Alf, MD;  Location: [facility];  Service: Orthopedics;  Laterality: Left;  . Tee without cardioversion N/A 02/08/2014    Procedure: TRANSESOPHAGEAL ECHOCARDIOGRAM (TEE);  Surgeon: Larey Dresser, MD;  Location: [facility];  Service: Cardiovascular;  Laterality: N/A;  . Cardiac catheterization  02/18/14  . Left and right heart catheterization with coronary angiogram N/A 02/18/2014    Procedure: LEFT AND RIGHT HEART CATHETERIZATION WITH CORONARY ANGIOGRAM;  Surgeon: Larey Dresser, MD;  Location: [facility] C[facility] LAB;  Service: Cardiovascular;  Laterality: N/A;    There were no vitals taken for this visit.  Visit Diagnosis:  Abnormality of gait - Plan: PT plan of care cert/re-cert  Debility - Plan: PT plan of care cert/re-cert      Subjective Assessment - 09/29/14 1109    Symptoms Impaired balance, falls, SOB with talking/activity   Pertinent History B TKA, CVA 4-5 years ago which resulted in partial blindness in R eye, TIA in 12/2012, B hand and R LE tremors   Patient Stated Goals More energy, stop the falls and improve balance   Currently in Pain? No/denies  [facility] PT Assessment - 09/29/14 1113    Assessment   Medical Diagnosis Frequent falls   Onset Date 07/31/11   Prior Therapy Pulmonary rehab   Precautions   Precautions Fall   Restrictions   Weight Bearing Restrictions No   Balance Screen   Has the patient fallen in the past 6 months Yes   How many times? 6-7   Has the patient had a decrease in activity level because of a fear of falling?  No   Is the patient reluctant to leave their home because of a fear of falling?  Yes   [place] Private residence   Living Arrangements Spouse/significant other  husband   Available Help at Discharge Other (Comment)  unable to help due to bad back/dementia   Type of [place] to enter  5 steps with handrails at front   Entrance Stairs-Number of Steps  2   [place] or work area in basement;One level   [place] - single point;Walker - 2 wheels;Shower seat - built in;Bedside commode;Toilet riser  pt would like a rollator, use RW at night   Prior Function   Level of Independence Independent with basic ADLs;Independent with homemaking with ambulation;Independent with gait;Independent with transfers   Vocation Retired   Leisure Go to [employer], travel with husband, scrapbooking, photography, walk outdoors (pick pecans off tree in yard)   Cognition   Overall Cognitive Status Within Functional Limits for tasks assessed   Behaviors Other (comment)  Pt appeared anxious during first half of eval.   Observation/Other Assessments   Focus on Therapeutic Outcomes (FOTO)  Pt did not care to finish FOTO, as she does not use computers   Sensation   Light Touch Appears Intact   Coordination   Gross Motor Movements are Fluid and Coordinated Yes   Fine Motor Movements are Fluid and Coordinated Yes   Posture/Postural Control   Posture/Postural Control Postural limitations   Postural Limitations Forward head   ROM / Strength   AROM / PROM / Strength AROM;Strength   AROM   Overall AROM  Within functional limits for tasks performed   Strength   Overall Strength Deficits   Overall Strength Comments B LE strength grossly: 4/5, except for B hip abd 3+/5 and B ankle dorsiflex: 3+/5.   Transfers   Transfers Sit to Stand;Stand to Sit   Sit to Stand 5: Supervision;With upper extremity assist;From chair/3-in-1   Stand to Sit 5: Supervision;With upper extremity assist;To chair/3-in-1   Ambulation/Gait   Ambulation/Gait Yes   Ambulation/Gait Assistance 5: Supervision;4: Min guard   Ambulation/Gait Assistance Details Pt ambulated over terrain with one LOB, which she self corrected with stepping strategy. Pt SOB after amb., SaO2 on room air 96% prior to amb. and 94% after amb. HR 76bpm at rest and 84bpm after amb.  Pt's SOB appears to be from debility and anxiety due to fear of falling.   Ambulation Distance (Feet) 75 Feet   Assistive device None   Gait Pattern Step-through pattern;Decreased stride length;Decreased dorsiflexion - right;Decreased dorsiflexion - left  Decreased B arm swing and hip/trunk rotation   Ambulation Surface Level;Indoor   Gait velocity 1.31ft/sec.  no AD   Balance   Balance Assessed Yes   Static Standing Balance   Static Standing - Balance Support No upper extremity supported   Static Standing - Level of Assistance 5: Stand by assistance   Static  Standing - Comment/# of Minutes Pt able to stand for 30 seconds with no LOB. Pt unable to trial other static standing positions due to fatigue. Will trial next visit.   Standardized Balance Assessment   Standardized Balance Assessment Timed Up and Go Test  pt unable to perform BERG due to fatigue and SOB   Timed Up and Go Test   TUG Normal TUG   Normal TUG (seconds) 19.64  no AD                            PT Short Term Goals - 09/29/14 1309    PT SHORT TERM GOAL #1   Title Pt will be independent in HEP to improve functional mobility. Target date: 10/27/14   Status New   PT SHORT TERM GOAL #2   Title Perform BERG and write appropriate STG and LTG. Target date: 10/27/14.   Status New   PT SHORT TERM GOAL #3   Title Pt will ascend/descend 2 steps without handrails, with min guard, to improve functional mobility. Target date: 10/27/14.   Status New   PT SHORT TERM GOAL #4   Title Pt will ambulate 200' over even terrain, with LRAD, and supervision to improve funtional mobility. Target date: 10/27/14.   Status New   PT SHORT TERM GOAL #5   Title Pt will perform TUG with LRAD in </=13.5 seconds to decrease falls risk. Target date: 10/27/14.   Status New           PT Long Term Goals - 09/29/14 1357    PT LONG TERM GOAL #1   Title Pt will verbalize understanding of falls prevention strategies to decrease  falls risk. Target date: 11/24/14.   Status New   PT LONG TERM GOAL #2   Title Pt will ambulate 400' over even/uneven terrain with LRAD at MOD I level to improve functional mobility. Target date: 11/24/14.   Status New   PT LONG TERM GOAL #3   Title Pt will improve gait speed to >/=2.73ft/sec. 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 Target date: 11/24/14.   Status New   PT LONG TERM GOAL #4   Title Pt will ascend/descend 2 steps without handrail at MOD I level to improve functional mobility. Target date: 11/24/14.   Status New   PT LONG TERM GOAL #5   Title Pt will report 0 falls in the last 4 weeks to improve safety during ambulation. Target date: 11/24/14.   Status New               Plan - 09/29/14 1107    Clinical Impression Statement Pt is a pleasant 80 y/o female presenting to OPPT neuro with frequent falls and impaired balance. Pt reported she just "suddenly falls", and she's not sure why. She reported her balance has become progressively worse over the last 6 months, but balance issues began about 3 years ago. She reports 6-7 falls in the last six months. Pt reported she thinks the tremors in her hands and R LE make her balance worse. Pt reported she has been tested for Parkinson's and was negative. Pt appeared anxious during the first half of the eval but gradually became more relaxed while sitting.   Pt will benefit from skilled therapeutic intervention in order to improve on the following deficits Abnormal gait;Decreased activity tolerance;Decreased knowledge of use of DME;Decreased balance;Decreased strength;Decreased mobility   Rehab Potential Good   PT Frequency 2x /  week   PT Duration 8 weeks   PT Treatment/Interventions ADLs/Self Care Home Management;Gait training;Neuromuscular re-education;Stair training;Biofeedback;Functional mobility training;Patient/family education;Therapeutic activities;Therapeutic exercise;Manual techniques;DME Instruction;Balance training   PT Next  Visit Plan Assess BERG, initiate balance/strength HEP.   Consulted and Agree with Plan of Care Patient          G-Codes - 10-29-2014 1614    Functional Assessment Tool Used Gait speed without AD: 1.25ft/sec.; TUG without AD: 19.64sec.; pt able to stand with feet apart without LOB for 30 seconds.   Functional Limitation Mobility: Walking and moving around   Mobility: Walking and Moving Around Current Status [phone]) At least 40 percent but less than 60 percent impaired, limited or restricted   Mobility: Walking and Moving Around Goal Status [phone]) At least 1 percent but less than 20 percent impaired, limited or restricted       Problem List Patient Active Problem List   Diagnosis Date Noted  . Acute bronchitis 08/12/2014  . Irregular heart beats 08/12/2014  . Aftercare following surgery of the circulatory system, [place] 04/19/2014  . Pain of left lower extremity-Knee 04/19/2014  . Other malaise and fatigue 02/11/2014  . Loss of weight 02/11/2014  . Valvular heart disease 01/19/2014  . Pulmonary hypertension 01/19/2014  . CAP (community acquired pneumonia) 01/08/2014  . Asthma with acute exacerbation 01/06/2014  . Fall 01/06/2014  . Aortic stenosis, mild 11/18/2013  . Preventative health care 11/18/2013  . Anemia, unspecified 11/18/2013  . Tremor 09/21/2013  . Dyspnea 05/18/2013  . Physical deconditioning 05/18/2013  . OA (osteoarthritis) of knee 11/17/2012  . Hyperlipidemia 11/04/2012  . Occlusion and stenosis of carotid artery without mention of cerebral infarction 03/06/2012  . ASTHMA 10/09/2010  . Sudden visual loss 01/10/2009  . TRANSIENT ISCHEMIC ATTACK 01/10/2009  . CEREBROVASCULAR DISEASE 11/19/2007  . BRONCHITIS, RECURRENT 11/19/2007  . DIVERTICULOSIS OF COLON 11/19/2007  . Hypothyroidism 08/15/2007  . ANXIETY 08/15/2007  . Depression 08/15/2007  . Essential hypertension 08/15/2007  . GERD 08/15/2007  . DEGENERATIVE JOINT DISEASE 08/15/2007  . BACK PAIN, LUMBAR  08/15/2007    [name] 10/29/2014, 4:20 PM  [facility] [address] [place], [place], [address] Phone: [phone]   Fax:  [phone]  Geoffry Paradise, PT,DPT Oct 29, 2014 4:20 PM Phone: [phone] Fax: [phone]

## 2014-10-06 ENCOUNTER — Ambulatory Visit: Payer: Medicare Other

## 2014-10-06 DIAGNOSIS — R269 Unspecified abnormalities of gait and mobility: Secondary | ICD-10-CM

## 2014-10-06 DIAGNOSIS — R5381 Other malaise: Secondary | ICD-10-CM

## 2014-10-06 NOTE — Patient Instructions (Signed)
Perform all balance activities in corner with chair in front of you for safety:  Feet Apart, Head Motion - Eyes Open   With eyes open, feet apart, move head slowly: up and down and side to side for 30 seconds. Repeat _3___ times per session. Do __1__ sessions per day.  Copyright  VHI. All rights reserved.  Feet Together, Varied Arm Positions - Eyes Closed   Stand with feet together and arms at your side. Close eyes and visualize upright position. Hold _10-30___ seconds. Repeat __3__ times per session. Do __1__ sessions per day.  Copyright  VHI. All rights reserved.  Feet Together, Varied Arm Positions - Eyes Open   With eyes open, feet together, arms at your side, look straight ahead at a stationary object. Hold __30__ seconds. Repeat __3__ times per session. Do _1___ sessions per day.  Copyright  VHI. All rights reserved.  Feet Heel-Toe "Tandem", Varied Arm Positions - Eyes Open   With eyes open, right foot directly in front of the other, arms out, look straight ahead at a stationary object. Hold __15-30__ seconds. Repeat with left foot in front. Repeat __3__ times per session. Do __1__ sessions per day.  Copyright  VHI. All rights reserved.  Single Leg - Eyes Open   Holding support, lift right leg while maintaining balance over other leg. Progress to removing hands from support surface for longer periods of time. Hold__10-30__ seconds. Repeat with other leg lifted. Repeat __3__ times per session per leg. Do __1__ sessions per day.  Copyright  VHI. All rights reserved.    Functional Quadriceps: Sit to Stand   Sit on edge of chair, feet flat on floor. Stand upright, extending knees fully. Repeat _10___ times per set. Do ___1_ sets per session. Do __2__ sessions per day.  http://orth.exer.us/735   Copyright  VHI. All rights reserved.

## 2014-10-06 NOTE — Therapy (Signed)
[facility] [address] [place], [place], [address] Phone: [phone]   Fax:  [phone]  Physical Therapy Treatment  Patient Details  Name: [name] MRN: [id number] Date of Birth: August 31, 1934 Referring Provider:  Biagio Borg, MD  Encounter Date: 10/06/2014      PT End of Session - 10/06/14 1256    Visit Number 2   Number of Visits 17   Date for PT Re-Evaluation 11/28/14   Authorization Type G-code every 10th visit and [employer]   PT Start Time 1146   PT Stop Time 1227   PT Time Calculation (min) 41 min   Equipment Utilized During Treatment Gait belt   Activity Tolerance Patient tolerated treatment well   Behavior During Therapy [facility] for tasks assessed/performed      Past Medical History  Diagnosis Date  . History of sudden visual loss   . Acute cystitis   . Bronchitis, acute   . Hypertension   . Cerebrovascular disease   . Hypothyroidism   . GERD (gastroesophageal reflux disease)   . Diverticulosis of colon   . DJD (degenerative joint disease)   . Back pain   . Transient ischemic attack   . Anxiety   . Depression   . Asthma   . COPD (chronic obstructive pulmonary disease)   . Shortness of breath     with exertion   . Pneumonia 2015    hx of x 2   . 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 UTI (lower urinary tract infection)     hx of   . Carotid artery occlusion   . Hyperlipidemia   . Aortic stenosis, mild 11/18/2013    Past Surgical History  Procedure Laterality Date  . Vaginal hysterectomy    . Sigmoid colectomy    . Tubal ligation    . Replacement total knee      right and left knee  . Cardiothoracic procedure  01/13/2009    Right   . Right foot surgery       nerve cut between toes   . Endartarectomy      right carotid endartarectomy - 2010  . Total knee arthroplasty Left 11/17/2012    Procedure: TOTAL KNEE ARTHROPLASTY;  Surgeon: Gearlean Alf, MD;  Location: [facility];   Service: Orthopedics;  Laterality: Left;  . Tee without cardioversion N/A 02/08/2014    Procedure: TRANSESOPHAGEAL ECHOCARDIOGRAM (TEE);  Surgeon: Larey Dresser, MD;  Location: [facility];  Service: Cardiovascular;  Laterality: N/A;  . Cardiac catheterization  02/18/14  . Left and right heart catheterization with coronary angiogram N/A 02/18/2014    Procedure: LEFT AND RIGHT HEART CATHETERIZATION WITH CORONARY ANGIOGRAM;  Surgeon: Larey Dresser, MD;  Location: [facility] CATH LAB;  Service: Cardiovascular;  Laterality: N/A;    There were no vitals taken for this visit.  Visit Diagnosis:  Abnormality of gait  Debility      Subjective Assessment - 10/06/14 1148    Symptoms Pt denied falls or changes since last visit.   Pertinent History B TKA, CVA 4-5 years ago which resulted in partial blindness in R eye, TIA in 12/2012, B hand and R LE tremors   Patient Stated Goals More energy, stop the falls and improve balance   Currently in Pain? No/denies                    [facility] Adult PT Treatment/Exercise - 10/06/14 1149  Transfers   Transfers Sit to Stand;Stand to Sit   Sit to Stand 5: Supervision;Without upper extremity assist  to/from mat   Sit to Stand Details (indicate cue type and reason) x10. VC's to not use UEs.   Stand to Sit 5: Supervision;Without upper extremity assist  to/from mat   Stand to Sit Details x10. VC's to improve eccentric control.   Balance   Balance Assessed Yes   Static Standing Balance   Static Standing - Balance Support No upper extremity supported;Right upper extremity supported   Static Standing - Level of Assistance 5: Stand by assistance;Other (comment)  min guard   Static Standing - Comment/# of Minutes Performed in corner with chair in front of pt for safety: B LEs, 2-3 sets with 10-30 second holds: feet apart with eyes open/closed, feet apart with head turns, feet together with eyes open/closed, tandem stance, single leg stance with one UE  support. Pt required UE support during 4 LOB episodes. VC's and demonstration for technique.   Standardized Balance Assessment   Standardized Balance Assessment Berg Balance Test   Berg Balance Test   Sit to Stand Able to stand without using hands and stabilize independently   Standing Unsupported Able to stand safely 2 minutes   Sitting with Back Unsupported but Feet Supported on Floor or Stool Able to sit safely and securely 2 minutes   Stand to Sit Sits safely with minimal use of hands   Transfers Able to transfer safely, minor use of hands   Standing Unsupported with Eyes Closed Able to stand 10 seconds with supervision   Standing Ubsupported with Feet Together Able to place feet together independently and stand for 1 minute with supervision   From Standing, Reach Forward with Outstretched Arm Can reach forward >12 cm safely (5")  7.5"   From Standing Position, Pick up Object from Floor Able to pick up shoe, needs supervision   From Standing Position, Turn to Look Behind Over each Shoulder Looks behind from both sides and weight shifts well   Turn 360 Degrees Needs close supervision or verbal cueing   Standing Unsupported, Alternately Place Feet on Step/Stool Able to complete 4 steps without aid or supervision   Standing Unsupported, One Foot in Front Able to plae foot ahead of the other independently and hold 30 seconds   Standing on One Leg Tries to lift leg/unable to hold 3 seconds but remains standing independently  2 seconds   Total Score 43      Pt required seated rest breaks during session due to fatigue.          PT Education - 10/06/14 1256    Education provided Yes   Education Details Balance HEP   Person(s) Educated Patient   Methods Explanation;Demonstration;Verbal cues;Handout   Comprehension Verbalized understanding;Returned demonstration;Need further instruction          PT Short Term Goals - 10/06/14 1259    PT SHORT TERM GOAL #1   Title Pt will be  independent in HEP to improve functional mobility. Target date: 10/27/14   Status On-going   PT SHORT TERM GOAL #2   Title Perform BERG and write appropriate STG and LTG. Target date: 10/27/14.   Status Achieved   PT SHORT TERM GOAL #3   Title Pt will ascend/descend 2 steps without handrails, with min guard, to improve functional mobility. Target date: 10/27/14.   Status On-going   PT SHORT TERM GOAL #4   Title Pt will ambulate 200' over even  terrain, with LRAD, and supervision to improve funtional mobility. Target date: 10/27/14.   Status On-going   PT SHORT TERM GOAL #5   Title Pt will perform TUG with LRAD in </=13.5 seconds to decrease falls risk. Target date: 10/27/14.   Status On-going   Additional Short Term Goals   Additional Short Term Goals Yes   PT SHORT TERM GOAL #6   Title Pt will improve BERG balance score to >/=47/56 to decrease falls risk. Target date: 10/27/14.   Status New           PT Long Term Goals - 10/06/14 1259    PT LONG TERM GOAL #1   Title Pt will verbalize understanding of falls prevention strategies to decrease falls risk. Target date: 11/24/14.   Status On-going   PT LONG TERM GOAL #2   Title Pt will ambulate 400' over even/uneven terrain with LRAD at MOD I level to improve functional mobility. Target date: 11/24/14.   Status On-going   PT LONG TERM GOAL #3   Title Pt will improve gait speed to >/=2.61ft/sec. 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 Target date: 11/24/14.   Status On-going   PT LONG TERM GOAL #4   Title Pt will ascend/descend 2 steps without handrail at MOD I level to improve functional mobility. Target date: 11/24/14.   Status On-going   PT LONG TERM GOAL #5   Title Pt will report 0 falls in the last 4 weeks to improve safety during ambulation. Target date: 11/24/14.   Status On-going   Additional Long Term Goals   Additional Long Term Goals Yes   PT LONG TERM GOAL #6   Title Pt will improve BERG balance score to >/=51/56 to reduce falls  risk. Target date: 11/24/14.   Status New               Plan - 10/06/14 1256    Clinical Impression Statement Pt scored a 43/56 on the BERG scale indicating she is at a significant risk for falls. Pt noted to experience heavy breathing but denied SOB during session, pt's SaO2 on room air was 94% and HR 74bpm; heavy breathing likely due to fear of falling. Continue with POC.   Pt will benefit from skilled therapeutic intervention in order to improve on the following deficits Abnormal gait;Decreased activity tolerance;Decreased knowledge of use of DME;Decreased balance;Decreased strength;Decreased mobility   Rehab Potential Good   PT Frequency 2x / week   PT Duration 8 weeks   PT Treatment/Interventions ADLs/Self Care Home Management;Gait training;Neuromuscular re-education;Stair training;Biofeedback;Functional mobility training;Patient/family education;Therapeutic activities;Therapeutic exercise;Manual techniques;DME Instruction;Balance training   PT Next Visit Plan Initiate strength HEP and gait training with SPC/RW/rollator   PT Home Exercise Plan balance HEP   Consulted and Agree with Plan of Care Patient        Problem List Patient Active Problem List   Diagnosis Date Noted  . Acute bronchitis 08/12/2014  . Irregular heart beats 08/12/2014  . Aftercare following surgery of the circulatory system, [place] 04/19/2014  . Pain of left lower extremity-Knee 04/19/2014  . Other malaise and fatigue 02/11/2014  . Loss of weight 02/11/2014  . Valvular heart disease 01/19/2014  . Pulmonary hypertension 01/19/2014  . CAP (community acquired pneumonia) 01/08/2014  . Asthma with acute exacerbation 01/06/2014  . Fall 01/06/2014  . Aortic stenosis, mild 11/18/2013  . Preventative health care 11/18/2013  . Anemia, unspecified 11/18/2013  . Tremor 09/21/2013  . Dyspnea 05/18/2013  . Physical deconditioning 05/18/2013  .  OA (osteoarthritis) of knee 11/17/2012  . Hyperlipidemia 11/04/2012   . Occlusion and stenosis of carotid artery without mention of cerebral infarction 03/06/2012  . ASTHMA 10/09/2010  . Sudden visual loss 01/10/2009  . TRANSIENT ISCHEMIC ATTACK 01/10/2009  . CEREBROVASCULAR DISEASE 11/19/2007  . BRONCHITIS, RECURRENT 11/19/2007  . DIVERTICULOSIS OF COLON 11/19/2007  . Hypothyroidism 08/15/2007  . ANXIETY 08/15/2007  . Depression 08/15/2007  . Essential hypertension 08/15/2007  . GERD 08/15/2007  . DEGENERATIVE JOINT DISEASE 08/15/2007  . BACK PAIN, LUMBAR 08/15/2007    Karron Alvizo L 10/06/2014, 1:00 PM  [facility] [address] [place], [place], [address] Phone: [phone]   Fax:  [phone]     Geoffry Paradise, PT,DPT 10/06/2014 1:00 PM Phone: [phone] Fax: [phone]

## 2014-10-12 ENCOUNTER — Ambulatory Visit: Payer: Medicare Other

## 2014-10-12 DIAGNOSIS — R5381 Other malaise: Secondary | ICD-10-CM

## 2014-10-12 DIAGNOSIS — R269 Unspecified abnormalities of gait and mobility: Secondary | ICD-10-CM

## 2014-10-12 NOTE — Patient Instructions (Addendum)
EXTENSION: Standing (Active)   Stand, both feet flat. Draw right leg behind body as far as possible. Repeat with left leg. Complete _3__ sets of _10__ repetitions. Perform _3__ sessions per week.  http://gtsc.exer.us/77   Copyright  VHI. All rights reserved.  ABDUCTION: Standing (Active)   Stand, feet flat, and take a half step back with kicking leg. Lift right leg out to side. Repeat with left leg. Complete _3__ sets of _10__ repetitions. Perform _3__ sessions per week.  http://gtsc.exer.us/111   Copyright  VHI. All rights reserved.   KNEE: Flexion - Standing   Bend knee to move heel toward buttocks. Do not move thigh forward. Repeat with other leg _3__ reps per set, __10_ sets per day, _3__ days per week Hold onto a support.  Copyright  VHI. All rights reserved.

## 2014-10-12 NOTE — Therapy (Signed)
[facility] [address] [place], [place], [address] Phone: [phone]   Fax:  [phone]  Physical Therapy Treatment  Patient Details  Name: [name] MRN: [id number] Date of Birth: 01-01-1935 Referring Provider:  Biagio Borg, MD  Encounter Date: 10/12/2014      PT End of Session - 10/12/14 1659    Visit Number 3   Number of Visits 17   Date for PT Re-Evaluation 11/28/14   Authorization Type G-code every 10th visit and [employer]   PT Start Time 1404   PT Stop Time 1444   PT Time Calculation (min) 40 min   Activity Tolerance Patient tolerated treatment well   Behavior During Therapy [facility] for tasks assessed/performed      Past Medical History  Diagnosis Date  . History of sudden visual loss   . Acute cystitis   . Bronchitis, acute   . Hypertension   . Cerebrovascular disease   . Hypothyroidism   . GERD (gastroesophageal reflux disease)   . Diverticulosis of colon   . DJD (degenerative joint disease)   . Back pain   . Transient ischemic attack   . Anxiety   . Depression   . Asthma   . COPD (chronic obstructive pulmonary disease)   . Shortness of breath     with exertion   . Pneumonia 2015    hx of x 2   . 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 UTI (lower urinary tract infection)     hx of   . Carotid artery occlusion   . Hyperlipidemia   . Aortic stenosis, mild 11/18/2013    Past Surgical History  Procedure Laterality Date  . Vaginal hysterectomy    . Sigmoid colectomy    . Tubal ligation    . Replacement total knee      right and left knee  . Cardiothoracic procedure  01/13/2009    Right   . Right foot surgery       nerve cut between toes   . Endartarectomy      right carotid endartarectomy - 2010  . Total knee arthroplasty Left 11/17/2012    Procedure: TOTAL KNEE ARTHROPLASTY;  Surgeon: Gearlean Alf, MD;  Location: [facility];  Service: Orthopedics;  Laterality: Left;  . Tee  without cardioversion N/A 02/08/2014    Procedure: TRANSESOPHAGEAL ECHOCARDIOGRAM (TEE);  Surgeon: Larey Dresser, MD;  Location: [facility];  Service: Cardiovascular;  Laterality: N/A;  . Cardiac catheterization  02/18/14  . Left and right heart catheterization with coronary angiogram N/A 02/18/2014    Procedure: LEFT AND RIGHT HEART CATHETERIZATION WITH CORONARY ANGIOGRAM;  Surgeon: Larey Dresser, MD;  Location: [facility] C[facility];  Service: Cardiovascular;  Laterality: N/A;    There were no vitals filed for this visit.  Visit Diagnosis:  Abnormality of gait  Debility      Subjective Assessment - 10/12/14 1408    Symptoms Pt reported she fell when trying to pick a book off the floor. Pt reported her L upper arm muscle feels bruised. Pt reported she has performed some of her HEP.   Pertinent History B TKA, CVA 4-5 years ago which resulted in partial blindness in R eye, TIA in 12/2012, B hand and R LE tremors   Patient Stated Goals More energy, stop the falls and improve balance   Currently in Pain? No/denies      Therex -Seated: R ankle dorsiflexion with  red theraband 2x10. VC's and demonstration for technique. Pt reported exercise was too easy. -Hooklying B hip abd: 3x10 with red theraband. VC's for technique. Pt reported therex too easy. -Standing: B hip abd/hip ext., hamstring curls all 3x10. VC's and demonstration for technique and to maintain upright posture.. Pt required three seated rest breaks and two standing rest breaks during session due to fatigue.                       PT Education - 10/12/14 1659    Education provided Yes   Education Details Strengthening HEP   Person(s) Educated Patient   Methods Explanation;Demonstration;Verbal cues;Handout   Comprehension Verbalized understanding;Returned demonstration          PT Short Term Goals - 10/12/14 1702    PT SHORT TERM GOAL #1   Title Pt will be independent in HEP to improve functional mobility.  Target date: 10/27/14   Status On-going   PT SHORT TERM GOAL #2   Title Perform BERG and write appropriate STG and LTG. Target date: 10/27/14.   Status Achieved   PT SHORT TERM GOAL #3   Title Pt will ascend/descend 2 steps without handrails, with min guard, to improve functional mobility. Target date: 10/27/14.   Status On-going   PT SHORT TERM GOAL #4   Title Pt will ambulate 200' over even terrain, with LRAD, and supervision to improve funtional mobility. Target date: 10/27/14.   Status On-going   PT SHORT TERM GOAL #5   Title Pt will perform TUG with LRAD in </=13.5 seconds to decrease falls risk. Target date: 10/27/14.   Status On-going   PT SHORT TERM GOAL #6   Title Pt will improve BERG balance score to >/=47/56 to decrease falls risk. Target date: 10/27/14.   Status On-going           PT Long Term Goals - 10/12/14 1702    PT LONG TERM GOAL #1   Title Pt will verbalize understanding of falls prevention strategies to decrease falls risk. Target date: 11/24/14.   Status On-going   PT LONG TERM GOAL #2   Title Pt will ambulate 400' over even/uneven terrain with LRAD at MOD I level to improve functional mobility. Target date: 11/24/14.   Status On-going   PT LONG TERM GOAL #3   Title Pt will improve gait speed to >/=2.28ft/sec. 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 Target date: 11/24/14.   Status On-going   PT LONG TERM GOAL #4   Title Pt will ascend/descend 2 steps without handrail at MOD I level to improve functional mobility. Target date: 11/24/14.   Status On-going   PT LONG TERM GOAL #5   Title Pt will report 0 falls in the last 4 weeks to improve safety during ambulation. Target date: 11/24/14.   Status On-going   PT LONG TERM GOAL #6   Title Pt will improve BERG balance score to >/=51/56 to reduce falls risk. Target date: 11/24/14.   Status On-going               Plan - 10/12/14 1700    Clinical Impression Statement Pt required frequent standing or seated rest  breaks during session due to fatigue. Pt tolerated standing HEP well, as she reported supine/seated therex was "too easy". Continue with POC.   Pt will benefit from skilled therapeutic intervention in order to improve on the following deficits Abnormal gait;Decreased activity tolerance;Decreased knowledge of use of DME;Decreased balance;Decreased strength;Decreased mobility  Rehab Potential Good   PT Frequency 2x / week   PT Duration 8 weeks   PT Treatment/Interventions ADLs/Self Care Home Management;Gait training;Neuromuscular re-education;Stair training;Biofeedback;Functional mobility training;Patient/family education;Therapeutic activities;Therapeutic exercise;Manual techniques;DME Instruction;Balance training   PT Next Visit Plan Provide fall prevention strategies, and provide heel/toe raises HEP.   PT Home Exercise Plan Balance and strengthening HEP   Consulted and Agree with Plan of Care Patient        Problem List Patient Active Problem List   Diagnosis Date Noted  . Acute bronchitis 08/12/2014  . Irregular heart beats 08/12/2014  . Aftercare following surgery of the circulatory system, [place] 04/19/2014  . Pain of left lower extremity-Knee 04/19/2014  . Other malaise and fatigue 02/11/2014  . Loss of weight 02/11/2014  . Valvular heart disease 01/19/2014  . Pulmonary hypertension 01/19/2014  . CAP (community acquired pneumonia) 01/08/2014  . Asthma with acute exacerbation 01/06/2014  . Fall 01/06/2014  . Aortic stenosis, mild 11/18/2013  . Preventative health care 11/18/2013  . Anemia, unspecified 11/18/2013  . Tremor 09/21/2013  . Dyspnea 05/18/2013  . Physical deconditioning 05/18/2013  . OA (osteoarthritis) of knee 11/17/2012  . Hyperlipidemia 11/04/2012  . Occlusion and stenosis of carotid artery without mention of cerebral infarction 03/06/2012  . ASTHMA 10/09/2010  . Sudden visual loss 01/10/2009  . TRANSIENT ISCHEMIC ATTACK 01/10/2009  . CEREBROVASCULAR DISEASE  11/19/2007  . BRONCHITIS, RECURRENT 11/19/2007  . DIVERTICULOSIS OF COLON 11/19/2007  . Hypothyroidism 08/15/2007  . ANXIETY 08/15/2007  . Depression 08/15/2007  . Essential hypertension 08/15/2007  . GERD 08/15/2007  . DEGENERATIVE JOINT DISEASE 08/15/2007  . BACK PAIN, LUMBAR 08/15/2007    Sharonica Kraszewski L 10/12/2014, 5:02 PM  [facility] [address] [place], [place], [address] Phone: [phone]   Fax:  [phone]    Geoffry Paradise, PT,DPT 10/12/2014 5:02 PM Phone: [phone] Fax: [phone]

## 2014-10-13 ENCOUNTER — Ambulatory Visit: Payer: Medicare Other

## 2014-10-13 DIAGNOSIS — R269 Unspecified abnormalities of gait and mobility: Secondary | ICD-10-CM | POA: Diagnosis not present

## 2014-10-13 DIAGNOSIS — R5381 Other malaise: Secondary | ICD-10-CM

## 2014-10-13 NOTE — Therapy (Signed)
[facility] [address] [place], [place], [address] Phone: [phone]   Fax:  [phone]  Physical Therapy Treatment  Patient Details  Name: [name] MRN: [id number] Date of Birth: Sep 17, 1934 Referring Provider:  Biagio Borg, MD  Encounter Date: 10/13/2014      PT End of Session - 10/13/14 1641    Visit Number 4   Number of Visits 17   Date for PT Re-Evaluation 11/28/14   Authorization Type G-code every 10th visit and [employer]   PT Start Time 1447   PT Stop Time 1528   PT Time Calculation (min) 41 min   Equipment Utilized During Treatment Gait belt   Activity Tolerance Patient tolerated treatment well   Behavior During Therapy [facility] for tasks assessed/performed      Past Medical History  Diagnosis Date  . History of sudden visual loss   . Acute cystitis   . Bronchitis, acute   . Hypertension   . Cerebrovascular disease   . Hypothyroidism   . GERD (gastroesophageal reflux disease)   . Diverticulosis of colon   . DJD (degenerative joint disease)   . Back pain   . Transient ischemic attack   . Anxiety   . Depression   . Asthma   . COPD (chronic obstructive pulmonary disease)   . Shortness of breath     with exertion   . Pneumonia 2015    hx of x 2   . 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 UTI (lower urinary tract infection)     hx of   . Carotid artery occlusion   . Hyperlipidemia   . Aortic stenosis, mild 11/18/2013    Past Surgical History  Procedure Laterality Date  . Vaginal hysterectomy    . Sigmoid colectomy    . Tubal ligation    . Replacement total knee      right and left knee  . Cardiothoracic procedure  01/13/2009    Right   . Right foot surgery       nerve cut between toes   . Endartarectomy      right carotid endartarectomy - 2010  . Total knee arthroplasty Left 11/17/2012    Procedure: TOTAL KNEE ARTHROPLASTY;  Surgeon: Gearlean Alf, MD;  Location: [facility];   Service: Orthopedics;  Laterality: Left;  . Tee without cardioversion N/A 02/08/2014    Procedure: TRANSESOPHAGEAL ECHOCARDIOGRAM (TEE);  Surgeon: Larey Dresser, MD;  Location: [facility];  Service: Cardiovascular;  Laterality: N/A;  . Cardiac catheterization  02/18/14  . Left and right heart catheterization with coronary angiogram N/A 02/18/2014    Procedure: LEFT AND RIGHT HEART CATHETERIZATION WITH CORONARY ANGIOGRAM;  Surgeon: Larey Dresser, MD;  Location: [facility] CATH LAB;  Service: Cardiovascular;  Laterality: N/A;    There were no vitals filed for this visit.  Visit Diagnosis:  Abnormality of gait  Debility      Subjective Assessment - 10/13/14 1450    Symptoms Pt denied falls or changes since last visit. Pt reported she has been a little more tired than usual.   Pertinent History B TKA, CVA 4-5 years ago which resulted in partial blindness in R eye, TIA in 12/2012, B hand and R LE tremors   Patient Stated Goals More energy, stop the falls and improve balance   Currently in Pain? No/denies  [facility] Adult PT Treatment/Exercise - 10/13/14 1635    Ambulation/Gait   Ambulation/Gait Yes   Ambulation/Gait Assistance 5: Supervision;4: Min guard   Ambulation/Gait Assistance Details Pt ambulated over even terrain with and without performing head turn. Pt required min guard while performing head turns as she experienced 3 LOB episodes which she self corrected with stepping strategy. VC's to improve stride length, B heel strike, and B arm swing. Pt required two seated rest breaks 2/2 fatigue.    Ambulation Distance (Feet) --  117', 230'   Assistive device None   Gait Pattern Step-through pattern;Decreased stride length;Decreased dorsiflexion - right;Decreased dorsiflexion - left   Ambulation Surface Level;Indoor   High Level Balance   High Level Balance Activities Marching forwards   High Level Balance Comments 4x7': At counter with one UE support and min  guard. Pt experienced 2 LOB episodes due to narrow BOS, pt self corrected balance with UE support. VC's for technique and to improve narrow BOS.   Exercises   Exercises Ankle   Ankle Exercises: Standing   Heel Raises 20 reps   Heel Raises Limitations B LE: at counter with 1-2 UE support. VC's for technique.   Toe Raise 20 reps   Toe Raise Limitations B LE: at counter with cues to decr. trunk flexion.      Pt required seated rest breaks after performing balance and therex activities, due to fatigue.          PT Education - 10/13/14 1641    Education provided Yes   Education Details Toe/heel raises, marches at counter, and fall prevention hanout. PT educated pt on the importance of wearing tennis shoes/rubber soled shoes vs. slippers to prevent falls.   Person(s) Educated Patient   Methods Explanation;Demonstration;Verbal cues;Handout   Comprehension Verbalized understanding;Returned demonstration          PT Short Term Goals - 10/12/14 1702    PT SHORT TERM GOAL #1   Title Pt will be independent in HEP to improve functional mobility. Target date: 10/27/14   Status On-going   PT SHORT TERM GOAL #2   Title Perform BERG and write appropriate STG and LTG. Target date: 10/27/14.   Status Achieved   PT SHORT TERM GOAL #3   Title Pt will ascend/descend 2 steps without handrails, with min guard, to improve functional mobility. Target date: 10/27/14.   Status On-going   PT SHORT TERM GOAL #4   Title Pt will ambulate 200' over even terrain, with LRAD, and supervision to improve funtional mobility. Target date: 10/27/14.   Status On-going   PT SHORT TERM GOAL #5   Title Pt will perform TUG with LRAD in </=13.5 seconds to decrease falls risk. Target date: 10/27/14.   Status On-going   PT SHORT TERM GOAL #6   Title Pt will improve BERG balance score to >/=47/56 to decrease falls risk. Target date: 10/27/14.   Status On-going           PT Long Term Goals - 10/12/14 1702    PT LONG  TERM GOAL #1   Title Pt will verbalize understanding of falls prevention strategies to decrease falls risk. Target date: 11/24/14.   Status On-going   PT LONG TERM GOAL #2   Title Pt will ambulate 400' over even/uneven terrain with LRAD at MOD I level to improve functional mobility. Target date: 11/24/14.   Status On-going   PT LONG TERM GOAL #3   Title Pt will improve gait speed to >/=2.74ft/sec. 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 Target date: 11/24/14.   Status On-going   PT LONG TERM GOAL #4   Title Pt will ascend/descend 2 steps without handrail at MOD I level to improve functional mobility. Target date: 11/24/14.   Status On-going   PT LONG TERM GOAL #5   Title Pt will report 0 falls in the last 4 weeks to improve safety during ambulation. Target date: 11/24/14.   Status On-going   PT LONG TERM GOAL #6   Title Pt will improve BERG balance score to >/=51/56 to reduce falls risk. Target date: 11/24/14.   Status On-going               Plan - 10/13/14 1642    Clinical Impression Statement Pt demonstrated progress towards goals, as she was able to perform dynamic gait activities with min guard. Pt experienced LOB due to narrow BOS during counter marches, and required cues to correct. Pt continues to require rest breaks 2/2 fatigue. Pt would continue to benefit from skilled PT to improve safety during functional mobility.    Pt will benefit from skilled therapeutic intervention in order to improve on the following deficits Abnormal gait;Decreased activity tolerance;Decreased knowledge of use of DME;Decreased balance;Decreased strength;Decreased mobility   Rehab Potential Good   PT Frequency 2x / week   PT Duration 8 weeks   PT Treatment/Interventions ADLs/Self Care Home Management;Gait training;Neuromuscular re-education;Stair training;Biofeedback;Functional mobility training;Patient/family education;Therapeutic activities;Therapeutic exercise;Manual techniques;DME Instruction;Balance  training   PT Next Visit Plan Progress dynamic gait activities   PT Home Exercise Plan Balance and strengthening HEP   Consulted and Agree with Plan of Care Patient        Problem List Patient Active Problem List   Diagnosis Date Noted  . Acute bronchitis 08/12/2014  . Irregular heart beats 08/12/2014  . Aftercare following surgery of the circulatory system, [place] 04/19/2014  . Pain of left lower extremity-Knee 04/19/2014  . Other malaise and fatigue 02/11/2014  . Loss of weight 02/11/2014  . Valvular heart disease 01/19/2014  . Pulmonary hypertension 01/19/2014  . CAP (community acquired pneumonia) 01/08/2014  . Asthma with acute exacerbation 01/06/2014  . Fall 01/06/2014  . Aortic stenosis, mild 11/18/2013  . Preventative health care 11/18/2013  . Anemia, unspecified 11/18/2013  . Tremor 09/21/2013  . Dyspnea 05/18/2013  . Physical deconditioning 05/18/2013  . OA (osteoarthritis) of knee 11/17/2012  . Hyperlipidemia 11/04/2012  . Occlusion and stenosis of carotid artery without mention of cerebral infarction 03/06/2012  . ASTHMA 10/09/2010  . Sudden visual loss 01/10/2009  . TRANSIENT ISCHEMIC ATTACK 01/10/2009  . CEREBROVASCULAR DISEASE 11/19/2007  . BRONCHITIS, RECURRENT 11/19/2007  . DIVERTICULOSIS OF COLON 11/19/2007  . Hypothyroidism 08/15/2007  . ANXIETY 08/15/2007  . Depression 08/15/2007  . Essential hypertension 08/15/2007  . GERD 08/15/2007  . DEGENERATIVE JOINT DISEASE 08/15/2007  . BACK PAIN, LUMBAR 08/15/2007    Delorean Knutzen L 10/13/2014, 4:45 PM  [facility] [address] [place], [place], [address] Phone: [phone]   Fax:  [phone]     Geoffry Paradise, PT,DPT 10/13/2014 4:45 PM Phone: [phone] Fax: [phone]

## 2014-10-13 NOTE — Patient Instructions (Addendum)
Heel Raises   Stand with support and with knees straight, raise heels off ground. Hold _2__ seconds. Repeat _20__ times. Do __1_ times a day.  Copyright  VHI. All rights reserved.  ANKLE: Dorsiflexion - Standing   Stand with upright posture. Raise toes of both feet up at same time. _20__ reps per set, _1__ sets per day, _7__ days per week Hold onto a support.  Copyright  VHI. All rights reserved.    "I love a Press photographer, march along counter for 10 steps. Make sure to not cross your feet. Repeat __4__ times. Do __1__ sessions per day.  http://gt2.exer.us/345   Copyright  VHI. All rights reserved.    Fall Prevention and Home Safety Falls cause injuries and can affect all age groups. It is possible to use preventive measures to significantly decrease the likelihood of falls. There are many simple measures which can make your home safer and prevent falls. OUTDOORS  Repair cracks and edges of walkways and driveways.  Remove high doorway thresholds.  Trim shrubbery on the main path into your home.  Have good outside lighting.  Clear walkways of tools, rocks, debris, and clutter.  Check that handrails are not broken and are securely fastened. Both sides of steps should have handrails.  Have leaves, snow, and ice cleared regularly.  Use sand or salt on walkways during winter months.  In the garage, clean up grease or oil spills. BATHROOM  Install night lights.  Install grab bars by the toilet and in the tub and shower.  Use non-skid mats or decals in the tub or shower.  Place a plastic non-slip stool in the shower to sit on, if needed.  Keep floors dry and clean up all water on the floor immediately.  Remove soap buildup in the tub or shower on a regular basis.  Secure bath mats with non-slip, double-sided rug tape.  Remove throw rugs and tripping hazards from the floors. BEDROOMS  Install night lights.  Make sure a bedside light  is easy to reach.  Do not use oversized bedding.  Keep a telephone by your bedside.  Have a firm chair with side arms to use for getting dressed.  Remove throw rugs and tripping hazards from the floor. KITCHEN  Keep handles on pots and pans turned toward the center of the stove. Use back burners when possible.  Clean up spills quickly and allow time for drying.  Avoid walking on wet floors.  Avoid hot utensils and knives.  Position shelves so they are not too high or low.  Place commonly used objects within easy reach.  If necessary, use a sturdy step stool with a grab bar when reaching.  Keep electrical cables out of the way.  Do not use floor polish or wax that makes floors slippery. If you must use wax, use non-skid floor wax.  Remove throw rugs and tripping hazards from the floor. STAIRWAYS  Never leave objects on stairs.  Place handrails on both sides of stairways and use them. Fix any loose handrails. Make sure handrails on both sides of the stairways are as long as the stairs.  Check carpeting to make sure it is firmly attached along stairs. Make repairs to worn or loose carpet promptly.  Avoid placing throw rugs at the top or bottom of stairways, or properly secure the rug with carpet tape to prevent slippage. Get rid of throw rugs, if possible.  Have an electrician put in a light switch  at the top and bottom of the stairs. OTHER FALL PREVENTION TIPS  Wear low-heel or rubber-soled shoes that are supportive and fit well. Wear closed toe shoes.  When using a stepladder, make sure it is fully opened and both spreaders are firmly locked. Do not climb a closed stepladder.  Add color or contrast paint or tape to grab bars and handrails in your home. Place contrasting color strips on first and last steps.  Learn and use mobility aids as needed. Install an electrical emergency response system.  Turn on lights to avoid dark areas. Replace light bulbs that burn out  immediately. Get light switches that glow.  Arrange furniture to create clear pathways. Keep furniture in the same place.  Firmly attach carpet with non-skid or double-sided tape.  Eliminate uneven floor surfaces.  Select a carpet pattern that does not visually hide the edge of steps.  Be aware of all pets. OTHER HOME SAFETY TIPS  Set the water temperature for 120 F (48.8 C).  Keep emergency numbers on or near the telephone.  Keep smoke detectors on every level of the home and near sleeping areas. Document Released: 07/06/2002 Document Revised: 01/15/2012 Document Reviewed: 10/05/2011 [facility] Patient Information 2015 [place], [place]. This information is not intended to replace advice given to you by your health care provider. Make sure you discuss any questions you have with your health care provider.

## 2014-10-19 ENCOUNTER — Ambulatory Visit: Payer: Medicare Other

## 2014-10-19 VITALS — BP 133/61 | HR 80

## 2014-10-19 DIAGNOSIS — R5381 Other malaise: Secondary | ICD-10-CM

## 2014-10-19 DIAGNOSIS — R269 Unspecified abnormalities of gait and mobility: Secondary | ICD-10-CM

## 2014-10-19 NOTE — Therapy (Signed)
[facility] [address] [place], [place], [address] Phone: [phone]   Fax:  [phone]  Physical Therapy Treatment  Patient Details  Name: [name] MRN: [id number] Date of Birth: 1935-06-12 Referring Provider:  Biagio Borg, MD  Encounter Date: 10/19/2014      PT End of Session - 10/19/14 1440    Visit Number 5   Number of Visits 17   Date for PT Re-Evaluation 11/28/14   Authorization Type G-code every 10th visit and [employer]   PT Start Time 1401   PT Stop Time 1443   PT Time Calculation (min) 42 min   Equipment Utilized During Treatment Gait belt   Activity Tolerance Patient tolerated treatment well   Behavior During Therapy [facility] for tasks assessed/performed      Past Medical History  Diagnosis Date  . History of sudden visual loss   . Acute cystitis   . Bronchitis, acute   . Hypertension   . Cerebrovascular disease   . Hypothyroidism   . GERD (gastroesophageal reflux disease)   . Diverticulosis of colon   . DJD (degenerative joint disease)   . Back pain   . Transient ischemic attack   . Anxiety   . Depression   . Asthma   . COPD (chronic obstructive pulmonary disease)   . Shortness of breath     with exertion   . Pneumonia 2015    hx of x 2   . 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 UTI (lower urinary tract infection)     hx of   . Carotid artery occlusion   . Hyperlipidemia   . Aortic stenosis, mild 11/18/2013    Past Surgical History  Procedure Laterality Date  . Vaginal hysterectomy    . Sigmoid colectomy    . Tubal ligation    . Replacement total knee      right and left knee  . Cardiothoracic procedure  01/13/2009    Right   . Right foot surgery       nerve cut between toes   . Endartarectomy      right carotid endartarectomy - 2010  . Total knee arthroplasty Left 11/17/2012    Procedure: TOTAL KNEE ARTHROPLASTY;  Surgeon: Gearlean Alf, MD;  Location: [facility];   Service: Orthopedics;  Laterality: Left;  . Tee without cardioversion N/A 02/08/2014    Procedure: TRANSESOPHAGEAL ECHOCARDIOGRAM (TEE);  Surgeon: Larey Dresser, MD;  Location: [facility];  Service: Cardiovascular;  Laterality: N/A;  . Cardiac catheterization  02/18/14  . Left and right heart catheterization with coronary angiogram N/A 02/18/2014    Procedure: LEFT AND RIGHT HEART CATHETERIZATION WITH CORONARY ANGIOGRAM;  Surgeon: Larey Dresser, MD;  Location: [facility] CATH LAB;  Service: Cardiovascular;  Laterality: N/A;    Filed Vitals:   10/19/14 1418 10/19/14 1419 10/19/14 1427  BP:   133/61  Pulse: 73 83 80  SpO2: 99% 99% 99%    Visit Diagnosis:  Abnormality of gait  Debility      Subjective Assessment - 10/19/14 1405    Symptoms Pt denied falls since last visit. Pt reported she hasn't been feeling good all weekend, but denies N/V. Pt unable to state why she did not feel well, just that she was tired.   Pertinent History B TKA, CVA 4-5 years ago which resulted in partial blindness in R eye, TIA in 12/2012, B hand and R LE tremors  Patient Stated Goals More energy, stop the falls and improve balance   Currently in Pain? No/denies                       [facility] Adult PT Treatment/Exercise - 10/19/14 1407    Ambulation/Gait   Ambulation/Gait Yes   Ambulation/Gait Assistance 5: Supervision   Ambulation/Gait Assistance Details Pt ambulated over even/uneven (indoors and outdoors) terrain while performing head turns. Vc's to improve B stride length, B heel strike, and to look straight ahead. Pt required two standing breaks during ambulation and seated rest breaks after each bout of amb. 2/2 fatigue. SaO2 on room air remained >98% during session, please see vitals section for details.   Ambulation Distance (Feet) --  75', 50', 300', 117'   Assistive device None   Gait Pattern Step-through pattern;Decreased stride length;Decreased dorsiflexion - right;Decreased dorsiflexion -  left   Ambulation Surface Level;Unlevel;Indoor;Outdoor   Exercises   Exercises Knee/Hip   Knee/Hip Exercises: Aerobic   Stationary Bike B LEs only: x8 minutes at level 2.0 for resistance to improve strength and endurance. Distance: 0. miles. VC's to use B LEs only and to keep >40RPMs.   Shoulder Exercises: Seated   Retraction 10 reps;Strengthening;Both   Theraband Level (Shoulder Retraction) Other (comment)  none   Retraction Weight (lbs) 0   Retraction Limitations 3x10 B UEs, VC's, demonstation, and tactile cues for technique.                PT Education - 10/19/14 1412    Education provided Yes   Education Details Scapular retraction HEP   Person(s) Educated Patient   Methods Explanation;Demonstration;Tactile cues;Verbal cues;Handout   Comprehension Verbalized understanding;Returned demonstration          PT Short Term Goals - 10/12/14 1702    PT SHORT TERM GOAL #1   Title Pt will be independent in HEP to improve functional mobility. Target date: 10/27/14   Status On-going   PT SHORT TERM GOAL #2   Title Perform BERG and write appropriate STG and LTG. Target date: 10/27/14.   Status Achieved   PT SHORT TERM GOAL #3   Title Pt will ascend/descend 2 steps without handrails, with min guard, to improve functional mobility. Target date: 10/27/14.   Status On-going   PT SHORT TERM GOAL #4   Title Pt will ambulate 200' over even terrain, with LRAD, and supervision to improve funtional mobility. Target date: 10/27/14.   Status On-going   PT SHORT TERM GOAL #5   Title Pt will perform TUG with LRAD in </=13.5 seconds to decrease falls risk. Target date: 10/27/14.   Status On-going   PT SHORT TERM GOAL #6   Title Pt will improve BERG balance score to >/=47/56 to decrease falls risk. Target date: 10/27/14.   Status On-going           PT Long Term Goals - 10/12/14 1702    PT LONG TERM GOAL #1   Title Pt will verbalize understanding of falls prevention strategies to  decrease falls risk. Target date: 11/24/14.   Status On-going   PT LONG TERM GOAL #2   Title Pt will ambulate 400' over even/uneven terrain with LRAD at MOD I level to improve functional mobility. Target date: 11/24/14.   Status On-going   PT LONG TERM GOAL #3   Title Pt will improve gait speed to >/=2.48ft/sec. 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 Target date: 11/24/14.   Status On-going   PT  LONG TERM GOAL #4   Title Pt will ascend/descend 2 steps without handrail at MOD I level to improve functional mobility. Target date: 11/24/14.   Status On-going   PT LONG TERM GOAL #5   Title Pt will report 0 falls in the last 4 weeks to improve safety during ambulation. Target date: 11/24/14.   Status On-going   PT LONG TERM GOAL #6   Title Pt will improve BERG balance score to >/=51/56 to reduce falls risk. Target date: 11/24/14.   Status On-going               Plan - 10/19/14 1634    Clinical Impression Statement Pt was limited today by fatigue and SOB, although SaO2 on room air remained >98% during session. PT encouraged pt to notify MD of SOB and increased fatigue. PT assessed pt's BP which was WNL. Pt continues to require rest breaks during session 2/2 fatigue. Continue with POC.   Pt will benefit from skilled therapeutic intervention in order to improve on the following deficits Abnormal gait;Decreased activity tolerance;Decreased knowledge of use of DME;Decreased balance;Decreased strength;Decreased mobility   Rehab Potential Good   PT Frequency 2x / week   PT Duration 8 weeks   PT Treatment/Interventions ADLs/Self Care Home Management;Gait training;Neuromuscular re-education;Stair training;Biofeedback;Functional mobility training;Patient/family education;Therapeutic activities;Therapeutic exercise;Manual techniques;DME Instruction;Balance training   PT Next Visit Plan Progress dynamic gait activities   PT Home Exercise Plan Balance and strengthening HEP   Consulted and Agree with Plan  of Care Patient        Problem List Patient Active Problem List   Diagnosis Date Noted  . Acute bronchitis 08/12/2014  . Irregular heart beats 08/12/2014  . Aftercare following surgery of the circulatory system, [place] 04/19/2014  . Pain of left lower extremity-Knee 04/19/2014  . Other malaise and fatigue 02/11/2014  . Loss of weight 02/11/2014  . Valvular heart disease 01/19/2014  . Pulmonary hypertension 01/19/2014  . CAP (community acquired pneumonia) 01/08/2014  . Asthma with acute exacerbation 01/06/2014  . Fall 01/06/2014  . Aortic stenosis, mild 11/18/2013  . Preventative health care 11/18/2013  . Anemia, unspecified 11/18/2013  . Tremor 09/21/2013  . Dyspnea 05/18/2013  . Physical deconditioning 05/18/2013  . OA (osteoarthritis) of knee 11/17/2012  . Hyperlipidemia 11/04/2012  . Occlusion and stenosis of carotid artery without mention of cerebral infarction 03/06/2012  . ASTHMA 10/09/2010  . Sudden visual loss 01/10/2009  . TRANSIENT ISCHEMIC ATTACK 01/10/2009  . CEREBROVASCULAR DISEASE 11/19/2007  . BRONCHITIS, RECURRENT 11/19/2007  . DIVERTICULOSIS OF COLON 11/19/2007  . Hypothyroidism 08/15/2007  . ANXIETY 08/15/2007  . Depression 08/15/2007  . Essential hypertension 08/15/2007  . GERD 08/15/2007  . DEGENERATIVE JOINT DISEASE 08/15/2007  . BACK PAIN, LUMBAR 08/15/2007    Miller,Jennifer L 10/19/2014, 4:37 PM  [facility] [address] [place], [place], [address] Phone: [phone]   Fax:  [phone]     Geoffry Paradise, PT,DPT 10/19/2014 4:37 PM Phone: [phone] Fax: [phone]

## 2014-10-19 NOTE — Patient Instructions (Signed)
SCAPULA: Retraction   Keep elbows at your side. Pinch shoulder blades together. Do not shrug shoulders. Hold _2__ seconds.  _10__ reps per set, _3__ sets per day, __4_ days per week.  Copyright  VHI. All rights reserved.

## 2014-10-21 ENCOUNTER — Ambulatory Visit: Payer: Medicare Other

## 2014-10-26 ENCOUNTER — Ambulatory Visit: Payer: Medicare Other

## 2014-10-28 ENCOUNTER — Ambulatory Visit: Payer: Medicare Other

## 2014-11-01 ENCOUNTER — Other Ambulatory Visit: Payer: Self-pay | Admitting: Internal Medicine

## 2014-11-02 ENCOUNTER — Ambulatory Visit: Payer: Medicare Other | Attending: Adult Health

## 2014-11-02 DIAGNOSIS — R5381 Other malaise: Secondary | ICD-10-CM | POA: Insufficient documentation

## 2014-11-02 DIAGNOSIS — J449 Chronic obstructive pulmonary disease, unspecified: Secondary | ICD-10-CM | POA: Diagnosis not present

## 2014-11-02 DIAGNOSIS — R269 Unspecified abnormalities of gait and mobility: Secondary | ICD-10-CM | POA: Insufficient documentation

## 2014-11-02 DIAGNOSIS — I10 Essential (primary) hypertension: Secondary | ICD-10-CM | POA: Insufficient documentation

## 2014-11-02 NOTE — Therapy (Signed)
[facility] [address] [place], [place], [address] Phone: [phone]   Fax:  [phone]  Physical Therapy Treatment  Patient Details  Name: [name] MRN: [id number] Date of Birth: 01/12/1935 Referring Provider:  Biagio Borg, MD  Encounter Date: 11/02/2014      PT End of Session - 11/02/14 1528    Visit Number 6   Number of Visits 17   Date for PT Re-Evaluation 11/28/14   Authorization Type G-code every 10th visit and [employer]   PT Start Time 1400   PT Stop Time 1441   PT Time Calculation (min) 41 min   Equipment Utilized During Treatment Gait belt   Activity Tolerance Patient tolerated treatment well   Behavior During Therapy [facility] for tasks assessed/performed      Past Medical History  Diagnosis Date  . History of sudden visual loss   . Acute cystitis   . Bronchitis, acute   . Hypertension   . Cerebrovascular disease   . Hypothyroidism   . GERD (gastroesophageal reflux disease)   . Diverticulosis of colon   . DJD (degenerative joint disease)   . Back pain   . Transient ischemic attack   . Anxiety   . Depression   . Asthma   . COPD (chronic obstructive pulmonary disease)   . Shortness of breath     with exertion   . Pneumonia 2015    hx of x 2   . 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 UTI (lower urinary tract infection)     hx of   . Carotid artery occlusion   . Hyperlipidemia   . Aortic stenosis, mild 11/18/2013    Past Surgical History  Procedure Laterality Date  . Vaginal hysterectomy    . Sigmoid colectomy    . Tubal ligation    . Replacement total knee      right and left knee  . Cardiothoracic procedure  01/13/2009    Right   . Right foot surgery       nerve cut between toes   . Endartarectomy      right carotid endartarectomy - 2010  . Total knee arthroplasty Left 11/17/2012    Procedure: TOTAL KNEE ARTHROPLASTY;  Surgeon: Gearlean Alf, MD;  Location: [facility];   Service: Orthopedics;  Laterality: Left;  . Tee without cardioversion N/A 02/08/2014    Procedure: TRANSESOPHAGEAL ECHOCARDIOGRAM (TEE);  Surgeon: Larey Dresser, MD;  Location: [facility];  Service: Cardiovascular;  Laterality: N/A;  . Cardiac catheterization  02/18/14  . Left and right heart catheterization with coronary angiogram N/A 02/18/2014    Procedure: LEFT AND RIGHT HEART CATHETERIZATION WITH CORONARY ANGIOGRAM;  Surgeon: Larey Dresser, MD;  Location: [facility] CATH LAB;  Service: Cardiovascular;  Laterality: N/A;    There were no vitals filed for this visit.  Visit Diagnosis:  Abnormality of gait  Debility      Subjective Assessment - 11/02/14 1402    Subjective Pt reported her neck has been bothering her, and that's why she missed PT last week. Pt denied falls since last visit. Pt reported she has tried to perform HEP but is having difficulty with it due to neck issues.   Pertinent History B TKA, CVA 4-5 years ago which resulted in partial blindness in R eye, TIA in 12/2012, B hand and R LE tremors   Patient Stated Goals More energy, stop the falls and improve balance  Currently in Pain? No/denies                       [facility] Adult PT Treatment/Exercise - 11/02/14 1410    Ambulation/Gait   Ambulation/Gait Yes   Ambulation/Gait Assistance 5: Supervision   Ambulation/Gait Assistance Details No LOB, pt ambulated in a guarded manner. VC's to improve upright posture, stride length, and heel strike.   Ambulation Distance (Feet) --  59' and 150' no AD   Assistive device None   Gait Pattern Step-through pattern;Decreased stride length;Decreased dorsiflexion - right;Decreased dorsiflexion - left   Ambulation Surface Level;Indoor   Gait velocity 1.70f/sec.  no AD   Stairs Yes   Stairs Assistance 4: Min guard;4: Min assist   Stairs Assistance Details (indicate cue type and reason) Pt used step through and step to pattern to traverse steps. min guard to ascend/descend  3 steps and pt required min A while descending last step. Cues to improve weight shifting.   Stair Management Technique No rails;Alternating pattern;Step to pattern   Number of Stairs 4   Height of Stairs 6   Standardized Balance Assessment   Standardized Balance Assessment Berg Balance Test;Timed Up and Go Test   Berg Balance Test   Sit to Stand Able to stand without using hands and stabilize independently   Standing Unsupported Able to stand safely 2 minutes   Sitting with Back Unsupported but Feet Supported on Floor or Stool Able to sit safely and securely 2 minutes   Stand to Sit Sits safely with minimal use of hands   Transfers Able to transfer safely, minor use of hands   Standing Unsupported with Eyes Closed Able to stand 10 seconds with supervision   Standing Ubsupported with Feet Together Able to place feet together independently and stand 1 minute safely   From Standing, Reach Forward with Outstretched Arm Can reach confidently >25 cm (10")   From Standing Position, Pick up Object from Floor Able to pick up shoe safely and easily   From Standing Position, Turn to Look Behind Over each Shoulder Looks behind from both sides and weight shifts well   Turn 360 Degrees Able to turn 360 degrees safely but slowly   Standing Unsupported, Alternately Place Feet on Step/Stool Able to stand independently and complete 8 steps >20 seconds   Standing Unsupported, One Foot in Front Able to plae foot ahead of the other independently and hold 30 seconds   Standing on One Leg Able to lift leg independently and hold equal to or more than 3 seconds   Total Score 49   Timed Up and Go Test   TUG Normal TUG   Normal TUG (seconds) 11.54  no AD                PT Education - 11/02/14 1527    Education provided Yes   Education Details PT educated pt on goals progress and meaning of outcome measure scores. PT also discussed checking STGs and then holding therapy until after MD appt. on 11/15/14,  as pt had to miss PT due to neck pain and is not sure if she will need neck surgery.   Person(s) Educated Patient   Methods Explanation   Comprehension Verbalized understanding          PT Short Term Goals - 11/02/14 1531    PT SHORT TERM GOAL #1   Title Pt will be independent in HEP to improve functional mobility. Target date: 10/27/14  Status On-going   PT SHORT TERM GOAL #2   Title Perform BERG and write appropriate STG and LTG. Target date: 10/27/14.   Status Achieved   PT SHORT TERM GOAL #3   Title Pt will ascend/descend 2 steps without handrails, with min guard, to improve functional mobility. Target date: 10/27/14.   Status Achieved   PT SHORT TERM GOAL #4   Title Pt will ambulate 200' over even terrain, with LRAD, and supervision to improve funtional mobility. Target date: 10/27/14.   Status Achieved   PT SHORT TERM GOAL #5   Title Pt will perform TUG with LRAD in </=13.5 seconds to decrease falls risk. Target date: 10/27/14.   Status Achieved   PT SHORT TERM GOAL #6   Title Pt will improve BERG balance score to >/=47/56 to decrease falls risk. Target date: 10/27/14.   Status Achieved           PT Long Term Goals - 10/12/14 1702    PT LONG TERM GOAL #1   Title Pt will verbalize understanding of falls prevention strategies to decrease falls risk. Target date: 11/24/14.   Status On-going   PT LONG TERM GOAL #2   Title Pt will ambulate 400' over even/uneven terrain with LRAD at MOD I level to improve functional mobility. Target date: 11/24/14.   Status On-going   PT LONG TERM GOAL #3   Title Pt will improve gait speed to >/=2.71ft/sec. 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 Target date: 11/24/14.   Status On-going   PT LONG TERM GOAL #4   Title Pt will ascend/descend 2 steps without handrail at MOD I level to improve functional mobility. Target date: 11/24/14.   Status On-going   PT LONG TERM GOAL #5   Title Pt will report 0 falls in the last 4 weeks to improve safety  during ambulation. Target date: 11/24/14.   Status On-going   PT LONG TERM GOAL #6   Title Pt will improve BERG balance score to >/=51/56 to reduce falls risk. Target date: 11/24/14.   Status On-going               Plan - 11/02/14 1529    Clinical Impression Statement Pt demonstrated progress as she met STGs 3, 4, 5, and 6. Pt required frequent seated rest breaks 2/2 fatigue. PT will assess HEP STG next session and hold PT until after 11/15/14 appt. with MD to assess neck pain. Pt will call after 11/15/14 to notify PT on whether or not she wishes to continue PT. Continue with POC.   Pt will benefit from skilled therapeutic intervention in order to improve on the following deficits Abnormal gait;Decreased activity tolerance;Decreased knowledge of use of DME;Decreased balance;Decreased strength;Decreased mobility   Rehab Potential Good   PT Frequency 2x / week   PT Duration 8 weeks   PT Treatment/Interventions ADLs/Self Care Home Management;Gait training;Neuromuscular re-education;Stair training;Biofeedback;Functional mobility training;Patient/family education;Therapeutic activities;Therapeutic exercise;Manual techniques;DME Instruction;Balance training   PT Next Visit Plan Check HEP STG and then hold pt for 2 weeks.   PT Home Exercise Plan Balance and strengthening HEP   Consulted and Agree with Plan of Care Patient        Problem List Patient Active Problem List   Diagnosis Date Noted  . Acute bronchitis 08/12/2014  . Irregular heart beats 08/12/2014  . Aftercare following surgery of the circulatory system, [place] 04/19/2014  . Pain of left lower extremity-Knee 04/19/2014  . Other malaise and fatigue 02/11/2014  . Loss of  weight 02/11/2014  . Valvular heart disease 01/19/2014  . Pulmonary hypertension 01/19/2014  . CAP (community acquired pneumonia) 01/08/2014  . Asthma with acute exacerbation 01/06/2014  . Fall 01/06/2014  . Aortic stenosis, mild 11/18/2013  . Preventative  health care 11/18/2013  . Anemia, unspecified 11/18/2013  . Tremor 09/21/2013  . Dyspnea 05/18/2013  . Physical deconditioning 05/18/2013  . OA (osteoarthritis) of knee 11/17/2012  . Hyperlipidemia 11/04/2012  . Occlusion and stenosis of carotid artery without mention of cerebral infarction 03/06/2012  . ASTHMA 10/09/2010  . Sudden visual loss 01/10/2009  . TRANSIENT ISCHEMIC ATTACK 01/10/2009  . CEREBROVASCULAR DISEASE 11/19/2007  . BRONCHITIS, RECURRENT 11/19/2007  . DIVERTICULOSIS OF COLON 11/19/2007  . Hypothyroidism 08/15/2007  . ANXIETY 08/15/2007  . Depression 08/15/2007  . Essential hypertension 08/15/2007  . GERD 08/15/2007  . DEGENERATIVE JOINT DISEASE 08/15/2007  . BACK PAIN, LUMBAR 08/15/2007    Delaine Hernandez L 11/02/2014, 3:32 PM  [facility] [address] [place], [place], [address] Phone: [phone]   Fax:  [phone]     Geoffry Paradise, PT,DPT 11/02/2014 3:32 PM Phone: [phone] Fax: [phone]

## 2014-11-05 ENCOUNTER — Ambulatory Visit: Payer: Medicare Other

## 2014-11-11 ENCOUNTER — Other Ambulatory Visit: Payer: Self-pay | Admitting: Internal Medicine

## 2014-11-12 ENCOUNTER — Ambulatory Visit: Payer: Medicare Other | Admitting: Cardiology

## 2014-11-19 ENCOUNTER — Encounter: Payer: Self-pay | Admitting: Internal Medicine

## 2014-11-19 ENCOUNTER — Ambulatory Visit (INDEPENDENT_AMBULATORY_CARE_PROVIDER_SITE_OTHER): Payer: Medicare Other | Admitting: Internal Medicine

## 2014-11-19 ENCOUNTER — Other Ambulatory Visit (INDEPENDENT_AMBULATORY_CARE_PROVIDER_SITE_OTHER): Payer: Medicare Other

## 2014-11-19 ENCOUNTER — Ambulatory Visit (INDEPENDENT_AMBULATORY_CARE_PROVIDER_SITE_OTHER)
Admission: RE | Admit: 2014-11-19 | Discharge: 2014-11-19 | Disposition: A | Discharge status: Home / Self Care | Payer: Medicare Other | Source: Ambulatory Visit | Attending: Internal Medicine | Admitting: Internal Medicine

## 2014-11-19 ENCOUNTER — Other Ambulatory Visit: Payer: Self-pay | Admitting: Internal Medicine

## 2014-11-19 VITALS — BP 128/64 | HR 74 | Temp 98.3°F | Ht 66.0 in | Wt 179.5 lb

## 2014-11-19 DIAGNOSIS — I270 Primary pulmonary hypertension: Secondary | ICD-10-CM | POA: Diagnosis not present

## 2014-11-19 DIAGNOSIS — R0609 Other forms of dyspnea: Secondary | ICD-10-CM

## 2014-11-19 DIAGNOSIS — J45909 Unspecified asthma, uncomplicated: Secondary | ICD-10-CM | POA: Diagnosis not present

## 2014-11-19 DIAGNOSIS — I10 Essential (primary) hypertension: Secondary | ICD-10-CM | POA: Diagnosis not present

## 2014-11-19 DIAGNOSIS — I272 Pulmonary hypertension, unspecified: Secondary | ICD-10-CM | POA: Insufficient documentation

## 2014-11-19 HISTORY — DX: Pulmonary hypertension, unspecified: I27.20

## 2014-11-19 LAB — URINALYSIS, ROUTINE W REFLEX MICROSCOPIC
Bilirubin Urine: NEGATIVE
HGB URINE DIPSTICK: NEGATIVE
Nitrite: POSITIVE — AB
Specific Gravity, Urine: 1.025 (ref 1.000–1.030)
TOTAL PROTEIN, URINE-UPE24: NEGATIVE
UROBILINOGEN UA: 0.2 (ref 0.0–1.0)
Urine Glucose: NEGATIVE
pH: 5.5 (ref 5.0–8.0)

## 2014-11-19 LAB — CBC WITH DIFFERENTIAL/PLATELET
BASOS ABS: 0 10*3/uL (ref 0.0–0.1)
Basophils Relative: 0.6 % (ref 0.0–3.0)
Eosinophils Absolute: 0.1 10*3/uL (ref 0.0–0.7)
Eosinophils Relative: 1.6 % (ref 0.0–5.0)
HCT: 34 % — ABNORMAL LOW (ref 36.0–46.0)
Hemoglobin: 11.7 g/dL — ABNORMAL LOW (ref 12.0–15.0)
Lymphocytes Relative: 31.6 % (ref 12.0–46.0)
Lymphs Abs: 1.9 10*3/uL (ref 0.7–4.0)
MCHC: 34.4 g/dL (ref 30.0–36.0)
MCV: 96.2 fl (ref 78.0–100.0)
MONO ABS: 0.5 10*3/uL (ref 0.1–1.0)
Monocytes Relative: 8.1 % (ref 3.0–12.0)
Neutro Abs: 3.5 10*3/uL (ref 1.4–7.7)
Neutrophils Relative %: 58.1 % (ref 43.0–77.0)
PLATELETS: 236 10*3/uL (ref 150.0–400.0)
RBC: 3.54 Mil/uL — ABNORMAL LOW (ref 3.87–5.11)
RDW: 13.9 % (ref 11.5–15.5)
WBC: 6.1 10*3/uL (ref 4.0–10.5)

## 2014-11-19 LAB — HEPATIC FUNCTION PANEL
ALT: 8 U/L (ref 0–35)
AST: 15 U/L (ref 0–37)
Albumin: 4.2 g/dL (ref 3.5–5.2)
Alkaline Phosphatase: 63 U/L (ref 39–117)
BILIRUBIN TOTAL: 0.5 mg/dL (ref 0.2–1.2)
Bilirubin, Direct: 0.1 mg/dL (ref 0.0–0.3)
Total Protein: 7.5 g/dL (ref 6.0–8.3)

## 2014-11-19 LAB — BASIC METABOLIC PANEL
BUN: 26 mg/dL — ABNORMAL HIGH (ref 6–23)
CHLORIDE: 103 meq/L (ref 96–112)
CO2: 30 mEq/L (ref 19–32)
CREATININE: 1.29 mg/dL — AB (ref 0.40–1.20)
Calcium: 9.8 mg/dL (ref 8.4–10.5)
GFR: 42.56 mL/min — AB (ref >60.00)
Glucose, Bld: 85 mg/dL (ref 70–99)
Potassium: 4.2 mEq/L (ref 3.5–5.1)
Sodium: 140 mEq/L (ref 135–145)

## 2014-11-19 LAB — TSH: TSH: 0.05 u[IU]/mL — ABNORMAL LOW (ref 0.35–4.50)

## 2014-11-19 MED ORDER — LEVOTHYROXINE SODIUM 50 MCG PO TABS: 50.0000 ug | ORAL_TABLET | Freq: Every day | ORAL | Status: DC

## 2014-11-19 MED ORDER — NITROFURANTOIN MONOHYD MACRO 100 MG PO CAPS: 100.0000 mg | ORAL_CAPSULE | Freq: Two times a day (BID) | ORAL | Status: DC

## 2014-11-19 MED ORDER — BUDESONIDE-FORMOTEROL FUMARATE 160-4.5 MCG/ACT IN AERO: 2.0000 | INHALATION_SPRAY | Freq: Two times a day (BID) | RESPIRATORY_TRACT | Status: DC

## 2014-11-19 MED ORDER — ALBUTEROL SULFATE HFA 108 (90 BASE) MCG/ACT IN AERS: 2.0000 | INHALATION_SPRAY | Freq: Four times a day (QID) | RESPIRATORY_TRACT | Status: DC | PRN

## 2014-11-19 NOTE — Progress Notes (Signed)
Pre visit review using our clinic review tool, if applicable. No additional management support is needed unless otherwise documented below in the visit note. 

## 2014-11-19 NOTE — Patient Instructions (Signed)
Your oxygen, blood pressure , heart rate and temp was Ok today  Your EKG was OK today  Please take all new medication as prescribed - the symbicort (if ok with [facility])  OK to stop the advair  We can change to [facility] if the symbicort if not covered by your insurance  Please go to the XRAY Department in the Basement (go straight as you get off the elevator) for the x-ray testing  Please go to the LAB in the Basement (turn left off the elevator) for the tests to be done today  You will be contacted by phone if any changes need to be made immediately.  Otherwise, you will receive a letter about your results with an explanation, but please check with MyChart first.  Please remember to sign up for MyChart if you have not done so, as this will be important to you in the future with finding out test results, communicating by private email, and scheduling acute appointments online when needed.  Please re-start your Physical Therapy as you are able, or call for a new referral if needed

## 2014-11-19 NOTE — Assessment & Plan Note (Addendum)
ECG reviewed as per emr, etiology unclear, no desat with exertion, hx and exam o/w unrevealing, for basic labs as documented, cxr, consider pulm referral, for d/c advair (not using) and trial symbicort asd  Note:  Total time for pt hx, exam, review of record with pt in the room, determination of diagnoses and plan for further eval and tx is > 40 min, with over 50% spent in coordination and counseling of patient

## 2014-11-19 NOTE — Progress Notes (Signed)
Subjective:    Patient ID: [name], female    DOB: [birth year], 80 y.o.   MRN: [id number]  HPI  Here with daughter, c/o sob/doe ongoing ? Gradually worse x 1-2 months, Pt denies chest pain, wheezing, orthopnea, PND, increased LE swelling, palpitations, dizziness or syncope.  Pt denies new neurological symptoms such as new headache, or facial or extremity weakness or numbness   Pt denies polydipsia, polyuria.  Denies worsening depressive symptoms, suicidal ideation, or panic  Is not using the advair as is too difficult Past Medical History  Diagnosis Date  . History of sudden visual loss   . Acute cystitis   . Bronchitis, acute   . Hypertension   . Cerebrovascular disease   . Hypothyroidism   . GERD (gastroesophageal reflux disease)   . Diverticulosis of colon   . DJD (degenerative joint disease)   . Back pain   . Transient ischemic attack   . Anxiety   . Depression   . Asthma   . COPD (chronic obstructive pulmonary disease)   . Shortness of breath     with exertion   . Pneumonia 2015    hx of x 2   . 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 UTI (lower urinary tract infection)     hx of   . Carotid artery occlusion   . Hyperlipidemia   . Aortic stenosis, mild 11/18/2013  . Pulmonary hypertension, moderate to severe 11/19/2014   Past Surgical History  Procedure Laterality Date  . Vaginal hysterectomy    . Sigmoid colectomy    . Tubal ligation    . Replacement total knee      right and left knee  . Cardiothoracic procedure  01/13/2009    Right   . Right foot surgery       nerve cut between toes   . Endartarectomy      right carotid endartarectomy - 2010  . Total knee arthroplasty Left 11/17/2012    Procedure: TOTAL KNEE ARTHROPLASTY;  Surgeon: Gearlean Alf, MD;  Location: [facility];  Service: Orthopedics;  Laterality: Left;  . Tee without cardioversion N/A 02/08/2014    Procedure: TRANSESOPHAGEAL ECHOCARDIOGRAM (TEE);  Surgeon: Larey Dresser, MD;  Location: [place];  Service: Cardiovascular;  Laterality: N/A;  . Cardiac catheterization  02/18/14  . Left and right heart catheterization with coronary angiogram N/A 02/18/2014    Procedure: LEFT AND RIGHT HEART CATHETERIZATION WITH CORONARY ANGIOGRAM;  Surgeon: Larey Dresser, MD;  Location: [facility];  Service: Cardiovascular;  Laterality: N/A;    reports that she has never smoked. She has never used smokeless tobacco. She reports that she does not drink alcohol or use illicit drugs. family history includes Coronary artery disease in her sister; Emphysema in her father; Heart attack in her brother; Heart disease in her mother and sister; Rheumatic fever in her brother. Allergies  Allergen Reactions  . Relafen [Nabumetone] Diarrhea and Other (See Comments)    GI / Urinary Bleeding  . Other Other (See Comments)    "orvail" unknown:  Causes Bloody stool  . Lipitor [Atorvastatin]     Causes memory loss  . Penicillins Hives, Itching, Swelling and Rash   Current Outpatient Prescriptions on File Prior to Visit  Medication Sig Dispense Refill  . amLODipine (NORVASC) 5 MG tablet Take 1 tablet (5 mg total) by mouth daily. 90 tablet 3  . aspirin EC 81 MG tablet Take 1 tablet (  81 mg total) by mouth daily. 90 tablet 3  . Cranberry-Vitamin C-Probiotic (AZO CRANBERRY) 250-30 MG TABS Take 1 capsule by mouth daily.    Marland Kitchen escitalopram (LEXAPRO) 10 MG tablet Take 1 tablet (10 mg total) by mouth daily. 90 tablet 3  . ferrous sulfate 325 (65 FE) MG tablet Take 325 mg by mouth daily with breakfast.    . fish oil-omega-3 fatty acids 1000 MG capsule Take 2 g by mouth daily.    . Nutritional Supplements (OSTEO ADVANCE) TABS Take 2 tablets by mouth daily.     Marland Kitchen omeprazole (PRILOSEC) 20 MG capsule Take 1 capsule (20 mg total) by mouth daily. 90 capsule 3  . simvastatin (ZOCOR) 20 MG tablet Take 1 tablet (20 mg total) by mouth at bedtime. 90 tablet 3   No current facility-administered medications on file prior to  visit.   Review of Systems  Constitutional: Negative for unusual diaphoresis or night sweats HENT: Negative for ringing in ear or discharge Eyes: Negative for double vision or worsening visual disturbance.  Respiratory: Negative for choking and stridor.   Gastrointestinal: Negative for vomiting or other signifcant bowel change Genitourinary: Negative for hematuria or change in urine volume.  Musculoskeletal: Negative for other MSK pain or swelling Skin: Negative for color change and worsening wound.  Neurological: Negative for tremors and numbness other than noted  Psychiatric/Behavioral: Negative for decreased concentration or agitation other than above       Objective:   Physical Exam Ambulatory o2 sat 93% on RA BP 128/64 mmHg  Pulse 74  Temp(Src) 98.3 F (36.8 C) (Oral)  Ht 5\' 6"  (1.676 m)  Wt 179 lb 8 oz (81.421 kg)  BMI 28.99 kg/m2  SpO2 95% VS noted,  Constitutional: Pt appears in no significant distress HENT: Head: NCAT.  Right Ear: External ear normal.  Left Ear: External ear normal.  Eyes: . Pupils are equal, round, and reactive to light. Conjunctivae and EOM are normal Neck: Normal range of motion. Neck supple.  Cardiovascular: Normal rate and regular rhythm.   Pulmonary/Chest: Effort normal and breath sounds decreased without rales or wheezing.  Abd:  Soft, NT, ND, + BS Neurological: Pt is alert. Not confused , motor grossly intact Skin: Skin is warm. No rash, no LE edema Psychiatric: Pt behavior is normal. No agitation.     Assessment & Plan:

## 2014-11-23 ENCOUNTER — Telehealth: Payer: Self-pay

## 2014-11-23 ENCOUNTER — Encounter: Payer: Self-pay | Admitting: Internal Medicine

## 2014-11-23 ENCOUNTER — Other Ambulatory Visit: Payer: Self-pay | Admitting: Internal Medicine

## 2014-11-23 MED ORDER — ADVAIR HFA 230-21 MCG/ACT IN AERO: 2.0000 | INHALATION_SPRAY | Freq: Two times a day (BID) | RESPIRATORY_TRACT | Status: DC

## 2014-11-23 MED ORDER — ADVAIR HFA 230-21 MCG/ACT IN AERO: 2.0000 | INHALATION_SPRAY | Freq: Two times a day (BID) | RESPIRATORY_TRACT | Status: AC

## 2014-11-23 NOTE — Telephone Encounter (Signed)
na

## 2014-11-23 NOTE — Assessment & Plan Note (Signed)
stable overall by history and exam, recent data reviewed with pt, and pt to continue medical treatment as before,  to f/u any worsening symptoms or concerns BP Readings from Last 3 Encounters:  11/19/14 128/64  10/19/14 133/61  09/14/14 148/72

## 2014-11-23 NOTE — Addendum Note (Signed)
Addended by: Earnstine Regal on: 11/23/2014 10:14 AM   Modules accepted: Orders

## 2014-11-23 NOTE — Telephone Encounter (Signed)
PT called patient regarding continuing therapy, as pt cancelled due to poor health. Pt reported she will see her MD on 12/06/14 and will call back after appt. to either schedule additional PT visits or to d/c.

## 2014-11-23 NOTE — Telephone Encounter (Signed)
Received note per pharmacy plan - [facility] -   symbicort not covered by ins  OK for advair HFA asd

## 2014-11-23 NOTE — Assessment & Plan Note (Signed)
No LE edema, possible related to doe, consider card referral

## 2014-12-06 ENCOUNTER — Ambulatory Visit (INDEPENDENT_AMBULATORY_CARE_PROVIDER_SITE_OTHER): Payer: Medicare Other | Admitting: Cardiology

## 2014-12-06 ENCOUNTER — Encounter: Payer: Self-pay | Admitting: Cardiology

## 2014-12-06 VITALS — BP 128/70 | HR 75 | Ht 66.0 in | Wt 176.0 lb

## 2014-12-06 DIAGNOSIS — R0600 Dyspnea, unspecified: Secondary | ICD-10-CM

## 2014-12-06 DIAGNOSIS — E785 Hyperlipidemia, unspecified: Secondary | ICD-10-CM

## 2014-12-06 DIAGNOSIS — I351 Nonrheumatic aortic (valve) insufficiency: Secondary | ICD-10-CM

## 2014-12-06 DIAGNOSIS — I38 Endocarditis, valve unspecified: Secondary | ICD-10-CM

## 2014-12-06 LAB — LIPID PANEL
CHOLESTEROL: 196 mg/dL (ref 0–200)
HDL: 70.8 mg/dL (ref >39.00)
LDL Cholesterol: 104 mg/dL — ABNORMAL HIGH (ref 0–99)
NonHDL: 125.2
TRIGLYCERIDES: 108 mg/dL (ref 0.0–149.0)
Total CHOL/HDL Ratio: 3
VLDL: 21.6 mg/dL (ref 0.0–40.0)

## 2014-12-06 LAB — BRAIN NATRIURETIC PEPTIDE: PRO B NATRI PEPTIDE: 61 pg/mL (ref 0.0–100.0)

## 2014-12-06 NOTE — Patient Instructions (Signed)
Medication Instructions:  Your physician recommends that you continue on your current medications as directed. Please refer to the Current Medication list given to you today.  Labwork: Your physician recommends that you have lab work today: Lipid and BNP  Testing/Procedures: Your physician has requested that you have an echocardiogram. Echocardiography is a painless test that uses sound waves to create images of your heart. It provides your doctor with information about the size and shape of your heart and how well your heart's chambers and valves are working. This procedure takes approximately one hour. There are no restrictions for this procedure.  Your physician has recommended that you have a pulmonary function test. Pulmonary Function Tests are a group of tests that measure how well air moves in and out of your lungs.  Follow-Up: Your physician wants you to follow-up in: 6 MONTHS with Dr Aundra Dubin.  You will receive a reminder letter in the mail two months in advance. If you don't receive a letter, please call our office to schedule the follow-up appointment.   Any Other Special Instructions Will Be Listed Below (If Applicable).

## 2014-12-07 NOTE — Progress Notes (Signed)
Patient ID: [name], female   DOB: April 30, 1935, 80 y.o.   MRN: [id number] PCP: Dr. Jenny Reichmann Pulmonology: Dr. Lenna Gilford  80 yo with long history of asthma with flares as well as carotid stenosis s/p CEA presents for cardiology followup.  She had a Lexiscan Cardiolite in 4/14 with no ischemia or infarction.  She has been doing pulmonary rehab for her chronic asthma.  This has not helped much.   For the last several months, her exertional dyspnea and fatigue has been worse.  She is short of breath after walking 20 yards.  No orthopnea or PND.  She is very limited.  She has had minimal wheezing recently.  No chest pain.  I had her get an echocardiogram in 6/15.  This showed EF 60-65%, moderate AI, severe MR, moderate TR with PA systolic pressure 65 mmHg.  This was a progression of valvular disease compared to the prior echo.    Given the concern for progressive MR echo, I had her do a TEE in 7/15.  This actually showed moderate AI and only mild MR.  EF was 60-65%.  Giving ongoing exertional symptoms, I had her do a right and left heart cath in 7/15.  This showed nonobstructive CAD and normal filling pressures.  There was no PAH.    Since last appointment, she did pulmonary rehab.  This did not seem to help much.  She says that her breathing has been worse recently.  She is short of breath walking 40-50 feet or going up any incline.  No orthopnea or PND.  No chest pain.  No wheezing.    Labs (3/13): LDL 103, HDL 91 Labs (4/13): K 4.6, creatinine 1.29 Labs (10/14): K 4.4, creatinine 1.3, LDL 66, HDL 76 Labs (6/15): K 3.9, creatinine 1.1, LDL 103, HDL 77 Labs (7/15): K 4.4, creatinine 1.5, HCT 32.1 Labs (4/16): K 4.2, creatinine 1.29, HCT 34  PMH: 1. Carotid stenosis: Amaurosis fugax in 2010, then right CEA in 6/10 (follows with Kellie Simmering).  2. Hypothyroidism 3. GERD 4. H/o diverticulitis with sigmoid colectomy in 2003.   5. OA with right TKR in 2008.  6. Asthma with flares 7. HTN 8. Dyspnea: Negative  stress test in 1995. Lexiscan Cardiolite (4/14) with no ischemia or infarction.   9.  Valvular heart disease: Echo (6/14) with EF 55-60%, mild LVH, very mild AS, moderate AI, mild MR, moderate TR.  Echo (6/15) with EF 60-65%, moderate AI, severe MR, moderate TR, PA systolic pressure 65 mmHg.  TEE (7/15) with EF 60-65%, normal RV size and systolic function, moderate AI, mild MR.  LHC/RHC (7/15): mean RA 3, PA 34/13 mean 21, mean PCWP 10, CI 3.94, nonobstructive CAD (high D1 with 50% stenosis).  10. Depression  SH: Married, never smoked, retired from [employer]  FH: Father with CABG in his 73s, mother with CAD.  Mother with rheumatic heart disease, had valve replacement.   ROS: All systems reviewed and negative except as per HPI.   Current Outpatient Prescriptions  Medication Sig Dispense Refill  . ADVAIR HFA 230-21 MCG/ACT inhaler Inhale 2 puffs into the lungs 2 (two) times daily. 1 Inhaler 3  . albuterol (PROVENTIL HFA;VENTOLIN HFA) 108 (90 BASE) MCG/ACT inhaler Inhale 2 puffs into the lungs every 6 (six) hours as needed for wheezing or shortness of breath. 3 Inhaler 3  . amLODipine (NORVASC) 5 MG tablet Take 1 tablet (5 mg total) by mouth daily. 90 tablet 3  . aspirin EC 81 MG tablet Take 1 tablet (81  mg total) by mouth daily. 90 tablet 3  . Cranberry-Vitamin C-Probiotic (AZO CRANBERRY) 250-30 MG TABS Take 1 capsule by mouth daily.    Marland Kitchen escitalopram (LEXAPRO) 10 MG tablet Take 1 tablet (10 mg total) by mouth daily. 90 tablet 3  . ferrous sulfate 325 (65 FE) MG tablet Take 325 mg by mouth daily with breakfast.    . fish oil-omega-3 fatty acids 1000 MG capsule Take 2 g by mouth daily.    Marland Kitchen levothyroxine (SYNTHROID, LEVOTHROID) 50 MCG tablet Take 1 tablet (50 mcg total) by mouth daily. 90 tablet 3  . nitrofurantoin, macrocrystal-monohydrate, (MACROBID) 100 MG capsule Take 1 capsule (100 mg total) by mouth 2 (two) times daily. 20 capsule 0  . Nutritional Supplements (OSTEO ADVANCE) TABS Take 2  tablets by mouth daily.     Marland Kitchen omeprazole (PRILOSEC) 20 MG capsule Take 1 capsule (20 mg total) by mouth daily. 90 capsule 3  . simvastatin (ZOCOR) 20 MG tablet Take 1 tablet (20 mg total) by mouth at bedtime. 90 tablet 3   No current facility-administered medications for this visit.   BP 128/70 mmHg  Pulse 75  Ht 5\' 6"  (1.676 m)  Wt 176 lb (79.833 kg)  BMI 28.42 kg/m2  SpO2 95% General: NAD, overweight Neck: No JVD, no thyromegaly or thyroid nodule.  Lungs: Dry crackles at bases bilaterally.  CV: Nondisplaced PMI.  Heart regular S1/S2, no S3/S4, 1/6 SEM RUSB.  No peripheral edema.  No carotid bruit.  Normal pedal pulses.  Abdomen: Soft, nontender, no hepatosplenomegaly, no distention.  Skin: Intact without lesions or rashes.  Neurologic: Alert and oriented x 3.  Psych: Normal affect. Extremities: No clubbing or cyanosis.   Assessment/Plan: 1. Exertional dyspnea:  Her echo in 6/15 showed significant worsening of valvular disease, with severe MR, moderate AI, moderate TR, and at least moderate pulmonary hypertension. I did a TEE in 7/15 that showed moderate AI but only mild MR.  I tried her on Lasix with no improvement.  LHC/RHC showed mild nonobstructive CAD and normal filling pressures. She says that her breathing is worse recently.  On exam today, she is not volume overloaded.  I continue to suspect that her exertional dyspnea is lung-related.  ?Pulmonary fibrosis, ?emphysema (never smoked but worked for [employer] for years so lots of secondhand smoke).   - Repeat echo to make sure that valvular heart disease has not worsened (she only has a soft murmur on exam).  - I will check a BNP.  - I will arrrange for full PFTs.   - I would like her to followup with Dr. Lenna Gilford for further pulmonary evaluation, will arrange appt.  2. Hyperlipidemia: She has known vascular disease (carotid stenosis).  Would like to see LDL < 70. Check lipids today (on simvastatin). 3. HTN: BP controlled.  4.  Carotid stenosis s/p CEA: Continue ASA 81 mg daily. Continue statin.  Followed by [facility].  Followup 6 months.   [name] 12/07/2014

## 2014-12-08 NOTE — Therapy (Signed)
[facility] [address] [place], [place], [address] Phone: [phone]   Fax:  [phone]  Patient Details  Name: [name] MRN: [id number] Date of Birth: 1934-08-17 Referring Provider:  No ref. provider found  Encounter Date: 12/08/2014  PHYSICAL THERAPY DISCHARGE SUMMARY  Visits from Start of Care: 6  Current functional level related to goals / functional outcomes:     PT Short Term Goals - 11/02/14 1531    PT SHORT TERM GOAL #1   Title Pt will be independent in HEP to improve functional mobility. Target date: 10/27/14   Status On-going   PT SHORT TERM GOAL #2   Title Perform BERG and write appropriate STG and LTG. Target date: 10/27/14.   Status Achieved   PT SHORT TERM GOAL #3   Title Pt will ascend/descend 2 steps without handrails, with min guard, to improve functional mobility. Target date: 10/27/14.   Status Achieved   PT SHORT TERM GOAL #4   Title Pt will ambulate 200' over even terrain, with LRAD, and supervision to improve funtional mobility. Target date: 10/27/14.   Status Achieved   PT SHORT TERM GOAL #5   Title Pt will perform TUG with LRAD in </=13.5 seconds to decrease falls risk. Target date: 10/27/14.   Status Achieved   PT SHORT TERM GOAL #6   Title Pt will improve BERG balance score to >/=47/56 to decrease falls risk. Target date: 10/27/14.   Status Achieved          PT Long Term Goals - 10/12/14 1702    PT LONG TERM GOAL #1   Title Pt will verbalize understanding of falls prevention strategies to decrease falls risk. Target date: 11/24/14.   Status On-going   PT LONG TERM GOAL #2   Title Pt will ambulate 400' over even/uneven terrain with LRAD at MOD I level to improve functional mobility. Target date: 11/24/14.   Status On-going   PT LONG TERM GOAL #3   Title Pt will improve gait speed to >/=2.15f/sec. 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   Status On-going   PT  LONG TERM GOAL #4   Title Pt will ascend/descend 2 steps without handrail at MOD I level to improve functional mobility. Target date: 11/24/14.   Status On-going   PT LONG TERM GOAL #5   Title Pt will report 0 falls in the last 4 weeks to improve safety during ambulation. Target date: 11/24/14.   Status On-going   PT LONG TERM GOAL #6   Title Pt will improve BERG balance score to >/=51/56 to reduce falls risk. Target date: 11/24/14.   Status On-going        Remaining deficits: Unknown as pt did not return to PT. Pt cancelled PT appointment due to illness, she stated that she would call back to reschedule or d/c. However, pt never called back. She is now outside her POC date and will need a new PT order to return to PT.   Education / Equipment: HEP  Plan: Patient agrees to discharge.  Patient goals were partially met. Patient is being discharged due to not returning since the last visit.  ?????        Unknown Flannigan L 12/08/2014, 9:16 AM  [facility] [address]Phone: [phone]  Fax:  [phone]  JGeoffry Paradise PT,DPT 12/08/2014 9:18 AM Phone: [phone]: [phone]

## 2014-12-10 ENCOUNTER — Other Ambulatory Visit: Payer: Self-pay

## 2014-12-10 ENCOUNTER — Ambulatory Visit (HOSPITAL_COMMUNITY): Payer: Medicare Other | Attending: Internal Medicine

## 2014-12-10 DIAGNOSIS — R0600 Dyspnea, unspecified: Secondary | ICD-10-CM | POA: Diagnosis not present

## 2014-12-10 DIAGNOSIS — I10 Essential (primary) hypertension: Secondary | ICD-10-CM | POA: Insufficient documentation

## 2014-12-10 DIAGNOSIS — J45909 Unspecified asthma, uncomplicated: Secondary | ICD-10-CM | POA: Diagnosis not present

## 2014-12-10 DIAGNOSIS — I350 Nonrheumatic aortic (valve) stenosis: Secondary | ICD-10-CM | POA: Diagnosis present

## 2014-12-10 DIAGNOSIS — I082 Rheumatic disorders of both aortic and tricuspid valves: Secondary | ICD-10-CM | POA: Insufficient documentation

## 2014-12-10 DIAGNOSIS — I351 Nonrheumatic aortic (valve) insufficiency: Secondary | ICD-10-CM | POA: Diagnosis not present

## 2014-12-10 DIAGNOSIS — E785 Hyperlipidemia, unspecified: Secondary | ICD-10-CM | POA: Diagnosis not present

## 2014-12-31 ENCOUNTER — Ambulatory Visit: Payer: Medicare Other | Admitting: Pulmonary Disease

## 2015-01-04 ENCOUNTER — Encounter: Payer: Self-pay | Admitting: Pulmonary Disease

## 2015-01-04 ENCOUNTER — Ambulatory Visit (INDEPENDENT_AMBULATORY_CARE_PROVIDER_SITE_OTHER): Payer: Medicare Other | Admitting: Pulmonary Disease

## 2015-01-04 VITALS — BP 118/64 | HR 70 | Temp 97.0°F | Wt 176.6 lb

## 2015-01-04 DIAGNOSIS — R531 Weakness: Secondary | ICD-10-CM

## 2015-01-04 DIAGNOSIS — J984 Other disorders of lung: Secondary | ICD-10-CM | POA: Diagnosis not present

## 2015-01-04 DIAGNOSIS — M159 Polyosteoarthritis, unspecified: Secondary | ICD-10-CM

## 2015-01-04 DIAGNOSIS — I270 Primary pulmonary hypertension: Secondary | ICD-10-CM

## 2015-01-04 DIAGNOSIS — M150 Primary generalized (osteo)arthritis: Secondary | ICD-10-CM

## 2015-01-04 DIAGNOSIS — I272 Pulmonary hypertension, unspecified: Secondary | ICD-10-CM

## 2015-01-04 DIAGNOSIS — R0600 Dyspnea, unspecified: Secondary | ICD-10-CM | POA: Diagnosis not present

## 2015-01-04 DIAGNOSIS — I38 Endocarditis, valve unspecified: Secondary | ICD-10-CM

## 2015-01-04 NOTE — Progress Notes (Signed)
Subjective:     Patient ID: [name], female   DOB: 14-Aug-1934, 80 y.o.   MRN: [id number]  HPI 80 y/o WF here for a follow up visit... she is followed both here and at the [place] where she gets her meds ([name])... she has mult med problems including hx refractory AB requiring Advair, Ventolin, Mucinex;  Cerebrovas dis w/ right CAE 6/10 by [name];  Hypothy on Synthroid;  GERD & Divertics w/ sigm colectomy 2003 for diverticulitis;  DJD w/ right TKR 2008 by [name]; and Anxiety... ~  SEE PREV EPIC NOTES FOR OLDER DATA >>    CXR 4/13 showed normal heart size, incr lung vols, clear x scarring left base, NAD...  PFT 4/13 showed FVC= 2.45 (84%), FEV1= 1.83 (84%), %1sec= 75, mid-flows= 84% predicted...  LABS 4/13:  Chems- ok x BUN=32 Creat=1.3;  CBC- ok w/ Hg=12.4  Cards eval by [name] 4/14 for Cardiac clearance prior to left THR> hx exertional dyspnea, HBP, carotid dis w/ prev R-CAE 2010 by [name]; he rec adding HCT & Atorva20 due to vasc dis; 2DEcho 4/14 showed norm LV size & fuction w/ EF=60-65%, Gr2DD, mild AI, modMR, mild LA&RA dil, PAsys=49;  Myoview 4/14 showed hypertensive BP response, no scar or ischemia, normal wall motion, study not gated...   ~  February 24, 2013:  83mo ROV & post hosp check> she presented to the ER 01/02/13 w/ aphasia, drooling, & left facial droop that occurred while off her ASA for knee surg (on Xarelto after knee surg); all symptoms resolved by the time she was eval in the ER- CT Brain showed mild chr sm vessel dis, otherw wnl, NAD; she refused adm & was seen by [name] in their office; she has hx right CAE by [facility] 6/10; [name] rec to restart ASA325, restart Lipitor she had stopped on her own (she refused but did agree to Cres10), & complete stroke w/u as outpt> SEE BELOW (included 2DEcho 6/14 showing norm LV size & function w/ EF=60-65%, Gr1DD, mildAS, modAI, mildMR, modTR, PAsys=36)...  We reviewed the following medical problems during today's office visit >>       AB> she is NOT using Advair, has ProventilHFA prn use; no recent URIs or resp exac but c/o DOE as before, she is way too sedentary/ no exercise at all; O2 sat on RA=97% and she was prev ambulated in office w/o desat; advised to [facility] or [facility]...    HBP> on Losartan50; BP= 150/64 but better at home she says; weight is down 23# to 168# today; [name] tried to start HCT & K10 but she didn't follow through; denies HA, CP, palpit, ch in SOB, edema, etc; she had pre-op cardiac clearance from [name] was non-ischemic...    Cerebrovasc dis> on ASA325 daily; s/p right CAE 2010 by [name]; she has yearly f/u w/ [name] & last seen 8/13 w/ Carotid Duplex showing patent right CAE w/o restenosis, & 0-39% left ICA stenosis w/ antegrade vertebrals; she had TIA as above w/ neg outpt eval by [name], & continues f/u [name] [facility] for stroke in right eye w/ visual impairment...    CHOL> she refused the Lipitor20 perceiving some side effect & [name] convinced her to try CRESTOR10=> due for FLP on the Cres10 soon...    Hypothy> on Synthroid100; labs 3/13 showed TSH=0.92 & she is clinically & biochemically euthyroid...    GI> GERD on Prilosec20/d, Hx divertics w/ sigmoid colectomy 2003 by [name]; prev followed by  [name]- ?last colon 2003? Referred to [name] for on-going care but she has yet to resched her colonoscopy & reminded to do so...    DJD> she is s/p right TKR 2008 by [name]; known DJD in spine, & bursitis in right hip w/ shot from [name]; s/p left TKR 4/14 by [name] & the knee is doing satis, but she had TIA while off the ASA (on Xarelto) in the post op period=> quickly resolved back to norm.    TIA> SEE ABOVE; she is now back on ASA325mg /d    Anxiety/ Depression> she is very tearful- "my husb has Alzheimers" & I again offered anxiolytic rx but she declines... We reviewed prob list, meds, xrays and labs> see below for updates >> SHE GETS ALL  HER MEDS FROM [facility]...  ~ May 18, 2013: 35mo ROV & [name] is c/o SOB, DOE that has been building up for a yr she says, "it's not my weight" which is up 9# to 177# today; she denies cough, sputum, hemoptysis, CP, etc; but she has noted some wheezing even though she says "my asthma is doing ok"; NOTE: prev PFTs 5/13 were wnl and CXR 4/14 was essentially clear, NAD; we decided to obtain a diagnostic work up w/ CXR, PFT, Ambulatory O2 sat eval, and Labs... As noted prev- she is not using her Advair, just the Albut HFA inhaler prn... She has an agenda today- she wants ADVAIR restarted "like before"; but in light of today's evaluation she is rec to start exercise program, join [employer] vs [employer], start MVI/ Fe/ VitD2000...  We reviewed prob list, meds, xrays and labs> see below for updates >> OK 2014 Flu vaccine today...   Last CXR was 4/14> norm heart size, clear lungs x left basilar scarring, mild DJD Tspine, NAD...  PFTs 10/14 showed FVC=1.68 (58%), FEV1=1.29 (60%), %1sec=77, mid-flows=64% predicted; c/w mild sm airways dis & prob mild restriction...   Ambulatory O2 test> On RA- O2sat=96% at rest w/ HR=81; Exercised 2Laps w/ lowest O2sat=96% w/ HR=96...  LABS 10/14: FLP- at goals on Simva10 from the [place]; Chems- wnl w/ Cr=1.3; CBC- sl anemic w/ Hg=10.0, MCV=94. Fe=78 (20%sat); TSH=0.38 on Synth100; VitD=35; She is rec to take MVI, Fe, VitD2000...  ~  September 21, 2013:  32mo ROV & [name] indicates that she has not followed our instructions after her last visit to start a regular exercise program- no PulmRehab (refused due to all the flu going around), no Silver Sneakers, no home exercise on her own; she also notes that she is not any better than before- we reviewed the results of her 10/14 eval- she requests a small portable O2 tank to take w/ her when out & about but we discussed the expense since she doesn't qualify under Medicare; she tells me that [name] tried her on Xanax but it was too  strong & made her too sleepy "my shaking is really bad" (no tremor noticed on today's exam); she tells me that she is now ready for PulmRehab program and will try ATIVAN 0.5mg - 1/2 to 1 tab Tid...     She is also c/o neck pain, back pain, & rec to take OTC anti-inflamm rx + heat & f/u w/ her Ortho, [name]...     We reviewed prob list, meds, xrays and labs> see below for updates >> Given TDAP today...  ~  January 19, 2014:  49mo ROV & Cira has had a lot going on during the interval>>    She started [employer]  program in April & had a baseline 63minWT w/ ambulation of 765ft, 0 breaks, lowest O2sat on RA was 94% & highest HR=39;  She had difficulty initially due to right hip pain, but more recently w/ incr exertional dyspnea- see below- f/u 2DEcho by [name] revealed severe MR and PulmHTN w/ PAsys=65...     She has established w/ DrJohn for primary care & saw him again 01/06/14 for weakness, malaise, cough, SOB, wheezing; she was afebrile, no purulent phlegm, no consolidation on exam, & treated for bronchitis w/ ZPak, Pred taper, Hycodan;  CXR 6/15 showed borderline cardiomeg, chronic changes and sl incr markings at bases;  Labs showed norm chemistries and mod anemia w/ Hg=9.8 and wbc normal at 8,900 w/ normal diff; she is improved but has her chr exertional dyspnea as before...    She had a yearly Cards f/u [name] 4/15> HBP, Valv heart dis, hx carotid stenosis; prev Myoview 4/14 was neg for ischemia or infarction; prev Echo's from 4/14 & 6/14 reviewed (valv heart dis- modAI&TR, mild-modMR, & PulmHTN); he ordered a repeat 2DEcho to check for valve progression but it wasn't done until 6/15 and revealed norm LV size & function w/ EF=60-65%, modAI, severeMR, modTR, severe LAdil, PAsys-65 => she has f/u appt pending to discuss further eval & rx options (?cath, ?valve surg, etc)...     She had f/u [name], DrLam 5/15> known cerebrovasc dis- s/p right CAE 2010 by [name], hx TIA 6/14, known 64mm intracavernous carotid  aneurysm on left; DrJohn started her on Lexapro for depression & she said she was improved; Xanax did not help her tremor (Thyroid dose decr sl for TSH=0.19); rec to continue ASA, BP control & statin rx...  We reviewed prob list, meds, xrays and labs> see below for updates >>   2DEcho 6/15 showed norm LV size & function w/ EF=60-65%, modAI, severeMR, modTR, severe LAdil, PAsys-65...  TEE 7/15 showed EF=60-65%, mod AI, & only mild MR (EF 60-65%, normal RV size and systolic function, moderate AI, mild MR).  CATH 7/15 (L&R heart) showed nonobstructive CAD, & norm filling pressures w/ no PAH (mean RA 3, PA 34/13 mean 21, mean PCWP 10, CI 3.94, nonobstructive CAD [high D1 with 50% stenosis]).   ~  January 04, 2015:  1year ROV requested by pt for worsening dyspnea> Luticia has been in declining health- her PCP is DrJohn and she sees [name] & CARDS-[name]... Today her CC is incr weakness, SOB, DOE w/ ADLs, and notes that her balance is off, she has fallen, she has tremors and has to eat w/ her fingers (it all appears neurodegenerative & she has an upcoming appt w/ [name] for [name]);  We reviewed the following medical problems during today's office visit >>     AB> on Advair230-2spBid but NOT using regularly, has ProventilHFA prn use; no recent URIs or resp exac but c/o progressive SOB/DOE, she is way too sedentary/ no exercise at all; O2 sat on RA=96% and she was prev ambulated in office w/o desat; she tried [employer] 7-03/2014 w/o much benefit; advised to [facility], [facility]akers, or similar...    HBP, valvular heart dis> on Amlod5; BP= 118/64; weight is up 9# to 177# today; denies HA, CP, palpit, ch in SOB, edema, etc; she had f/u w/ [name] 5/16> prev Myoview 2014 was non-ischemic; last Echo 6/15 as above showed progressive valvular dis & pulmHTN; They checked TEE & Cath (L&R heart cath)- SEE ABOVE... She had no improvement in  her dyspnea w/ Lasix...    Cerebrovasc dis> on ASA81  daily; s/p right CAE 2010 by [facility]; she has yearly f/u w/ [name] & last seen 9/15 w/ Carotid Duplex showing patent right CAE w/o restenosis, & 0-39% left ICA stenosis w/ antegrade vertebrals; she had TIA as above w/ neg outpt eval by [name], & continues f/u [name] [facility] for stroke in right eye w/ visual impairment...    CHOL> she refused the Lipitor20 perceiving some side effect & [name] convinced her to try CRESTOR10 but it was too $$, now on Simva20 w/ FLP 5/16 showing TChol 196, TG 108, HDL 71, LDL 104    Hypothy> on Synthroid50 now; labs were over-replaced on 64mcg/d, improved on 35mcg/d 6/16 w/ TSH=1.48, FreeT4=0.78    GI> GERD on Prilosec20/d, Hx divertics w/ sigmoid colectomy 2003 by [name]; prev followed by [name]- ?last colon 2003? Referred to [facility] for on-going care but she has yet to resched her colonoscopy...    DJD> she is s/p right TKR 2008 by [name]; known DJD in spine, & bursitis in right hip w/ shot from [name]; s/p left TKR 4/14 by [name] & the knee is doing satis, but she had TIA while off the ASA (on Xarelto) in the post op period=> quickly resolved back to norm.    TIA> SEE ABOVE; she is now back on ASA81mg /d    Anxiety/ Depression> she is very tearful- husb w/ Alzheimers and her deteriorating health... On Lexapro10. We reviewed prob list, meds, xrays and labs> see below for updates >> SHE GETS ALL HER MEDS FROM [facility]...  CXR 4/16 showed norm heart size, mild chr incr pulm interstitial markings- unchanged, no edema, NAD...   Spirometry 6/16 showed FVC=1.83 (62%), FEV1=1.36 (62%), %1sec=74, mid-flows sl reduced at 71% predicted... This is c/w mod restrictive dis but she won't be able to perform complete PFTs to confirm...  Ambulatory oxygen saturation test 6/16> O2sat=97% at rest on RA w/ pulse=65/min; she ambulated only 1 lap in office w/ lowest O2sat=90% w/ pulse=89/min; she does NOT want oxygen  PLAN>>  Her main problems at this point appear to be  generalized weakness and neurodegenerative- we have very little to offer from the pulm standpoint; PFT is more restricted that obstructed & her benefit from additional bronchdil Rx is thought to be min; she does not want oxygen therapy (I have recommended an ONO but she declines at this time); OK to continue her Advair & AlbutHFA prn; I have encouraged a regular exercize program as a way of fighting her severe deconditioning; she has an upcoming [name] f/u appt...            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  BRONCHITIS, RECURRENT and Hx ASTHMA - she is a non-smoker...  EXERTIONAL DYSPNEA >>  ~  episode refractory AB finally resolved 4/10 w/ Ab's/ Pred/ Mucinex/ etc; prev on Advair she stopped on her own & just uses AlbutHFA inhaler as needed... ~  10/14:  Presents w/ c/o 84yr slowly progressive dyspnea "no breath at all" but exam was neg & work up revealed ?pulm restriction vs poor effort and deconditioning> she wanted Advair restarted-ok, plus rec for exercise program PulmRehab vs Silver Sneakers, along w/ MVI, Fe, VitD2000u/d...  ~  2/15:  She notes persistent symptoms but never followed recommendations from 10/14; Rec to start  exercise program & consider Ativan 0.5mg  Tid... ~  6/15:  Treated for bronchitis by DrJohn w/ ZPak & Pred; CXR 6/15 showed borderline cardiomeg, chronic changes and sl incr markings at bases; supposed to be on Advair250-Bid... ~  6/16:  She presents w/ progressive SOB/DOE- appears to be in declining health, c/o generalized weakness & neurodegenerative symtoms; pulm eval w/ mod restrictive lung dis as a result, min basilar fibrosis, mild gas exchange abn but she declines home O2 at this point...  ~  CXR 4/16 showed norm heart size, mild chr incr pulm interstitial markings- unchanged, no edema, NAD...  ~  Spirometry 6/16 showed  FVC=1.83 (62%), FEV1=1.36 (62%), %1sec=74, mid-flows sl reduced at 71% predicted... This is c/w mod restrictive dis but she won't be able to perform complete PFTs to confirm... ~  Ambulatory oxygen saturation test 6/16> O2sat=97% at rest on RA w/ pulse=65/min; she ambulated only 1 lap in office w/ lowest O2sat=90% w/ pulse=89/min; she does NOT want oxygen   HYPERTENSION (ICD-401.9) >> VALVULAR HEART DISEASE >> with mod AI & TR, severe MR & LA dil found on 2DEcho 6/15 w/ severe PulmHTN (PAsys=65) ~  2DEcho 8/91 showed mild MR, trace AI, EF=60%... ~  NuclearStressTest 11/95 was normal...  ~  3/12:  BP= 120/70 & she says similar at home... takes med regularly and tol well... denies HA, fatigue, visual changes, CP, palipit, dizziness, syncope, dyspnea, edema, etc... ~  3/13:  BP= 150/84 & even better at home; she remains asymptomatic... ~  We attempted to incr the Losartan to 100mg /d but she states she can't tolerate a dose that high, went to ER w/ dyspnea, mild hypoxemia, & Creat=1.3; Cozaar reduced back to 50mg  & she states improved. ~  5/13:  BP= 158/72 but she says better at home & doesn't want additional meds... ~  1/14:  on Losartan50; BP= 124/80 & similar at home she says; denies HA, CP, palpit, ch in SOB, edema, etc... ~  Myoview 4/14 showed hypertensive BP response, no scar or ischemia, normal wall motion, study not gated...  ~  2DEcho 6/14 showed norm wall motion & LVF w/ EF=55-65%, mild AS/ modAI/ mildMR, no ASD or PFO... ~  7/14: on Losartan50; BP= 150/64 but better at home she says; weight is down 23# to 168# today; denies HA, CP, palpit, ch in SOB, edema, etc. ~ 10/14: on Losar50; BP= 152/70 and she is rec to increase to 100mg /d, monitor BP at home... ~  2/15: on Losar100; BP= 150/70 & weight is up a further 6#; we reviewed low sodium weight reducing diet... ~  4/15 to 6/15: on ASA325 & Losar100; she saw [name] for Cards f/u appt> HBP, Valv heart dis, hx carotid stenosis; prev Myoview  4/14 was neg for ischemia or infarction; prev Echo's from 4/14 & 6/14 reviewed (valv heart dis- modAI&TR, mild-modMR, & PulmHTN); he ordered a repeat 2DEcho to check for valve progression but it wasn't done until 6/15 and revealed norm LV size & function w/ EF=60-65%, modAI, severeMR, modTR, severe LAdil, PAsys-65 => she has f/u appt pending to discuss further eval & rx options (?cath, ?valve surg, etc)...  ~  7/15:  She had TEE & R&L heart cath> TEE 7/15 showed EF=60-65%, mod AI, & only mild MR (EF 60-65%, normal RV size and systolic function, moderate AI, mild MR). CATH 7/15 (L&R heart) showed nonobstructive CAD, & norm filling pressures w/ no PAH (mean RA 3, PA 34/13 mean 21, mean PCWP 10, CI 3.94, nonobstructive CAD [high  D1 with 50% stenosis]).  ~  She continues regular f/u w/ [name] for CARDS...   CEREBROVASCULAR DISEASE (ICD-437.9) - on ASA 81mg /d... she has hx small vessel ischemic dis and has been seen by DrWillis for [name] w/ hx amaurosis fugax in 2006... prev MRI w/ 44mm intracavernous carotid aneurysm on left... ~  MRI Brain 2006 w/ sm vessel ischemic dis & 63mm intracavernous carotid aneurym- being followed... ~  CDopplers 3/08 showed bilat ICA plaque, 0-39% bilat stenoses (no change from prev). ~  CDopplers 3/09 showed 40-59% right ICA stenosis & 0-39% left ICA stenosis; she was asymptomatic... ~  CDopplers 3/10 showed mod plaque in prox ICAs bilat w/ 87-86% RICA & 7-67% LICA stenoses. ~  6/10: developed transient loss of vision in right eye- ophthal eval by [name] & vasc eval [name] w/ repeat CDopplers showing 60-79% RICA stenosis, & 20-94% LICA stenosis ==> right RAE 6/10. ~  CDopplers followed by [name]'s office since her surg & stable w/ patent right ICA endart site & 70-96% (low end) LICA stenosis felt to be stable & nonprogressive (done 7/11 & 8/12)... ~  on ASA daily; seen 8/13 w/ Carotid Duplex showing patent right CAE w/o restenosis, & 0-39% left ICA stenosis w/ antegrade  vertebrals (no change from last yr); she denies cerebral ischemic symptoms... ~  MRA Neck 6/14 showed no signif stenosis, bilat antegrade vertebrals... ~  MRA Brain 6/14 showed no signif stenosis and prev described sm left carvernousICA aneurysm on 2006 scan was NOT seen currently... ~  She continues on ASA325 daily w/o cerebral ischemic symptoms... ~  She cut her ASA to 81mg /d> [name] f/u 9/15 w/ stable CDopplers...   HYPOTHYROIDISM (ICD-244.9) - on SYNTHROID 59mcg/d now  GERD (ICD-530.81) - on PRILOSEC 20mg /d... GI=[name] & last EGD 8/06 showed esoph stricture- dilated... rec for Prilosec daily...   DIVERTICULOSIS OF COLON (ICD-562.10) - she is s/p sigmoid colectomy 6/03 by [name]...   DEGENERATIVE JOINT DISEASE (ICD-715.90) - know CSpine DJD w/ osteophytes on prev scan... she is s/p right TKR 1/08 and left TKR 4/14 by [name]...  BACK PAIN, LUMBAR (ICD-724.2) - she takes [place] as needed.  TIA >> presented to ER 6/14 w/ aphasia, drooling, & left facial droop that occurred while off her ASA for knee surg; all symptoms resolved in 42min & she refused hosp; outpt w/u by [facility] [name]... ~  MRI Brain 6/14 showed chr microvasc ischemic dis w/ generalized atrophy, NAD...  ~ She remains on ASA325mg /d w/o cerebral ischemic symptoms... ~  6/16:  Her main problems appears to be generalized weakness and neurodegenerative- she has upcoming appt w/ [name]...  ANXIETY (ICD-300.00) DEPRESSION (ICD-311) - taking Lexapro10mg /d...   Past Surgical History  Procedure Laterality Date  . Vaginal hysterectomy    . Sigmoid colectomy    . Tubal ligation    . Replacement total knee      right and left knee  . Cardiothoracic procedure  01/13/2009    Right   . Right foot surgery       nerve cut between toes   . Endartarectomy      right carotid endartarectomy - 2010  . Total knee arthroplasty Left 11/17/2012    Procedure: TOTAL KNEE ARTHROPLASTY;  Surgeon: Gearlean Alf, MD;   Location: [facility];  Service: Orthopedics;  Laterality: Left;  . Tee without cardioversion N/A 02/08/2014    Procedure: TRANSESOPHAGEAL ECHOCARDIOGRAM (TEE);  Surgeon: Larey Dresser, MD;  Location: [facility];  Service: Cardiovascular;  Laterality: N/A;  .  Cardiac catheterization  02/18/14  . Left and right heart catheterization with coronary angiogram N/A 02/18/2014    Procedure: LEFT AND RIGHT HEART CATHETERIZATION WITH CORONARY ANGIOGRAM;  Surgeon: Larey Dresser, MD;  Location: [facility] CATH LAB;  Service: Cardiovascular;  Laterality: N/A;    Outpatient Encounter Prescriptions as of 01/04/2015  Medication Sig  . ADVAIR HFA 230-21 MCG/ACT inhaler Inhale 2 puffs into the lungs 2 (two) times daily.  Marland Kitchen albuterol (PROVENTIL HFA;VENTOLIN HFA) 108 (90 BASE) MCG/ACT inhaler Inhale 2 puffs into the lungs every 6 (six) hours as needed for wheezing or shortness of breath.  Marland Kitchen amLODipine (NORVASC) 5 MG tablet Take 1 tablet (5 mg total) by mouth daily.  Marland Kitchen aspirin EC 81 MG tablet Take 1 tablet (81 mg total) by mouth daily.  . Cranberry-Vitamin C-Probiotic (AZO CRANBERRY) 250-30 MG TABS Take 1 capsule by mouth daily.  Marland Kitchen escitalopram (LEXAPRO) 10 MG tablet Take 1 tablet (10 mg total) by mouth daily.  . ferrous sulfate 325 (65 FE) MG tablet Take 325 mg by mouth daily with breakfast.  . fish oil-omega-3 fatty acids 1000 MG capsule Take 2 g by mouth daily.  Marland Kitchen levothyroxine (SYNTHROID, LEVOTHROID) 50 MCG tablet Take 1 tablet (50 mcg total) by mouth daily.  . nitrofurantoin, macrocrystal-monohydrate, (MACROBID) 100 MG capsule Take 1 capsule (100 mg total) by mouth 2 (two) times daily.  . Nutritional Supplements (OSTEO ADVANCE) TABS Take 2 tablets by mouth daily.   Marland Kitchen omeprazole (PRILOSEC) 20 MG capsule Take 1 capsule (20 mg total) by mouth daily.  . simvastatin (ZOCOR) 20 MG tablet Take 1 tablet (20 mg total) by mouth at bedtime.   No facility-administered encounter medications on file as of 01/04/2015.    Allergies    Allergen Reactions  . Relafen [Nabumetone] Diarrhea and Other (See Comments)    GI / Urinary Bleeding  . Other Other (See Comments)    "orvail" unknown:  Causes Bloody stool  . Lipitor [Atorvastatin]     Causes memory loss  . Penicillins Hives, Itching, Swelling and Rash    Current Medications, Allergies, Past Medical History, Past Surgical History, Family History, and Social History were reviewed in [employer] record.   Review of Systems        The patient complains of dyspnea on exertion, gas/bloating, urinary hesitancy, nocturia, back pain, joint pain, arthritis, difficulty walking, and depression.  The patient denies fever, chills, sweats, anorexia, fatigue, weakness, malaise, weight loss, sleep disorder, blurring, diplopia, eye irritation, eye discharge, vision loss, eye pain, photophobia, earache, ear discharge, tinnitus, decreased hearing, nasal congestion, nosebleeds, sore throat, hoarseness, chest pain, palpitations, syncope, orthopnea, PND, peripheral edema, cough, dyspnea at rest, excessive sputum, hemoptysis, wheezing, pleurisy, nausea, vomiting, diarrhea, constipation, change in bowel habits, abdominal pain, melena, hematochezia, jaundice, indigestion/heartburn, dysphagia, odynophagia, dysuria, hematuria, urinary frequency, incontinence, joint swelling, muscle cramps, muscle weakness, stiffness, sciatica, restless legs, leg pain at night, leg pain with exertion, rash, itching, dryness, suspicious lesions, paralysis, paresthesias, seizures, tremors, vertigo, transient blindness, frequent falls, frequent headaches, anxiety, memory loss, confusion, cold intolerance, heat intolerance, polydipsia, polyphagia, polyuria, unusual weight change, abnormal bruising, bleeding, enlarged lymph nodes, urticaria, allergic rash, hay fever, and recurrent infections.     Objective:   Physical Exam    WD, WN, 80 y/o WF in NAD... GENERAL:  Alert & oriented; pleasant &  cooperative... HEENT:  [place]/AT, EOM-wnl, left marcus-gunn pupil, EACs-clear, TMs-wnl, NOSE-clear, THROAT-clear & wnl. NECK:  Supple w/ fairROM; no JVD; normal carotid impulses w/ faint  right bruit; no thyromegaly or nodules palpated; no lymphadenopathy. CHEST:  Bibasilar crackles w/o wheezing, rhonchi, signs of consolidation... HEART:  Regular Rhythm; without murmurs/ rubs/ or gallops detected... ABDOMEN:  Soft & nontender; normal bowel sounds; no organomegaly or masses palpated... EXT: without deformities, mild arthritic changes; no varicose veins/ +venous insuffic/ no edema. [name]:  no focal [name] deficits x part loss of vision left eye... DERM:  No lesions noted; no rash etc...  RADIOLOGY DATA:  Reviewed in the EPIC EMR & discussed w/ the patient...  LABORATORY DATA:  Reviewed in the EPIC EMR & discussed w/ the patient...     Assessment:      DYSPNEA> no real improvement after Advair, PulmRehab, etc;  Full cardiac eval by [name] reviewed;  She declines home oxygen therapy at this time... 6/16> Her main problems at this point appear to be generalized weakness and neurodegenerative- we have very little to offer from the pulm standpoint; PFT is more restricted that obstructed & her benefit from additional bronchdil Rx is thought to be min; she does not want oxygen therapy (I have recommended an ONO but she declines at this time); OK to continue her Advair & AlbutHFA prn; I have encouraged a regular exercize program as a way of fighting her severe deconditioning; she has an upcoming [name] f/u appt...   HBP>  Fair control on Losartan 100 & we reviewed diet, wt reduction & low sodium...  Cerebrovasc Dis>  Followed by [name] & stable s/p right CAE in 2010; continue ASA & [name] follow up...  CHOL>  She refused the Lipitor believing it caused some confusion; [name] convinced her to try Cres10 but [name] changed her to Simva20-1/2 tab daily...  Hypothyroid>  Synthroid dose decr by DrJohn  w/ TSH=0.19  GERD>  Stable on PPI Rx daily; continue Prilosec...  Divertics>  S/p sigmoid colectomy 2003; she is in need of GI f/u & 31yr colonoscopy- refer to [facility] (she still hasn't set this up)...  DJD>  S/p bilat TKRs w/ known back pain & bursitis right hip- s/p shot per [name]...  TIA>  She is stable back on her ASA, now 325mg /d w/ f/u [name]...  Anxiety/ Depression>  Not on meds despite her considerable stress, and she feels she is doing satis & doesn't want med rx...     Plan:     Patient's Medications  New Prescriptions   No medications on file  Previous Medications   ADVAIR HFA 230-21 MCG/ACT INHALER    Inhale 2 puffs into the lungs 2 (two) times daily.   ALBUTEROL (PROVENTIL HFA;VENTOLIN HFA) 108 (90 BASE) MCG/ACT INHALER    Inhale 2 puffs into the lungs every 6 (six) hours as needed for wheezing or shortness of breath.   AMLODIPINE (NORVASC) 5 MG TABLET    Take 1 tablet (5 mg total) by mouth daily.   ASPIRIN EC 81 MG TABLET    Take 1 tablet (81 mg total) by mouth daily.   CRANBERRY-VITAMIN C-PROBIOTIC (AZO CRANBERRY) 250-30 MG TABS    Take 1 capsule by mouth daily.   ESCITALOPRAM (LEXAPRO) 10 MG TABLET    Take 1 tablet (10 mg total) by mouth daily.   FERROUS SULFATE 325 (65 FE) MG TABLET    Take 325 mg by mouth daily with breakfast.   FISH OIL-OMEGA-3 FATTY ACIDS 1000 MG CAPSULE    Take 2 g by mouth daily.   LEVOTHYROXINE (SYNTHROID, LEVOTHROID) 50 MCG TABLET    Take 1 tablet (50 mcg  total) by mouth daily.   NITROFURANTOIN, MACROCRYSTAL-MONOHYDRATE, (MACROBID) 100 MG CAPSULE    Take 1 capsule (100 mg total) by mouth 2 (two) times daily.   NUTRITIONAL SUPPLEMENTS (OSTEO ADVANCE) TABS    Take 2 tablets by mouth daily.    OMEPRAZOLE (PRILOSEC) 20 MG CAPSULE    Take 1 capsule (20 mg total) by mouth daily.   SIMVASTATIN (ZOCOR) 20 MG TABLET    Take 1 tablet (20 mg total) by mouth at bedtime.  Modified Medications   No medications on file  Discontinued Medications     No medications on file

## 2015-01-04 NOTE — Patient Instructions (Signed)
Today we updated your med list in our EPIC system...    Continue your current medications the same...  Today we attempted a follow up pulmonary function test and an ambulatory oxygen saturation test...      If you are able to safely increase your exercise program at home, this will help to build your stamina...  Call for any questions or if we can be of service in any way.Marland KitchenMarland Kitchen

## 2015-01-06 DIAGNOSIS — R269 Unspecified abnormalities of gait and mobility: Secondary | ICD-10-CM | POA: Diagnosis not present

## 2015-01-06 DIAGNOSIS — M1990 Unspecified osteoarthritis, unspecified site: Secondary | ICD-10-CM | POA: Diagnosis not present

## 2015-01-06 DIAGNOSIS — F341 Dysthymic disorder: Secondary | ICD-10-CM | POA: Diagnosis not present

## 2015-01-06 DIAGNOSIS — Z8673 Personal history of transient ischemic attack (TIA), and cerebral infarction without residual deficits: Secondary | ICD-10-CM | POA: Diagnosis not present

## 2015-01-06 DIAGNOSIS — I10 Essential (primary) hypertension: Secondary | ICD-10-CM | POA: Diagnosis not present

## 2015-01-06 DIAGNOSIS — R531 Weakness: Secondary | ICD-10-CM | POA: Diagnosis not present

## 2015-01-06 DIAGNOSIS — J984 Other disorders of lung: Secondary | ICD-10-CM | POA: Diagnosis not present

## 2015-01-06 DIAGNOSIS — E039 Hypothyroidism, unspecified: Secondary | ICD-10-CM | POA: Diagnosis not present

## 2015-01-11 ENCOUNTER — Telehealth: Payer: Self-pay | Admitting: Internal Medicine

## 2015-01-11 DIAGNOSIS — I10 Essential (primary) hypertension: Secondary | ICD-10-CM | POA: Diagnosis not present

## 2015-01-11 DIAGNOSIS — R531 Weakness: Secondary | ICD-10-CM | POA: Diagnosis not present

## 2015-01-11 DIAGNOSIS — R269 Unspecified abnormalities of gait and mobility: Secondary | ICD-10-CM | POA: Diagnosis not present

## 2015-01-11 DIAGNOSIS — F341 Dysthymic disorder: Secondary | ICD-10-CM | POA: Diagnosis not present

## 2015-01-11 DIAGNOSIS — M1990 Unspecified osteoarthritis, unspecified site: Secondary | ICD-10-CM | POA: Diagnosis not present

## 2015-01-11 DIAGNOSIS — J984 Other disorders of lung: Secondary | ICD-10-CM | POA: Diagnosis not present

## 2015-01-11 NOTE — Telephone Encounter (Signed)
Patient fell yesterday 6/13 evening.  Patient lost balance but states did not hurt herself.

## 2015-01-11 NOTE — Telephone Encounter (Signed)
States Patient has been exhausted and cant get anything done. Feels like she is getting worse.  She does not know why.  States all vitals are fine. Will schedule patient an appointment.

## 2015-01-11 NOTE — Telephone Encounter (Signed)
Pt should go to ER now, unless she feels she is safe enough to be seen at next available appt, to make sure of things such as infection, anemia or electrolyte issues

## 2015-01-12 DIAGNOSIS — I10 Essential (primary) hypertension: Secondary | ICD-10-CM | POA: Diagnosis not present

## 2015-01-12 DIAGNOSIS — R269 Unspecified abnormalities of gait and mobility: Secondary | ICD-10-CM | POA: Diagnosis not present

## 2015-01-12 DIAGNOSIS — J984 Other disorders of lung: Secondary | ICD-10-CM | POA: Diagnosis not present

## 2015-01-12 DIAGNOSIS — R531 Weakness: Secondary | ICD-10-CM | POA: Diagnosis not present

## 2015-01-12 DIAGNOSIS — M1990 Unspecified osteoarthritis, unspecified site: Secondary | ICD-10-CM | POA: Diagnosis not present

## 2015-01-12 DIAGNOSIS — F341 Dysthymic disorder: Secondary | ICD-10-CM | POA: Diagnosis not present

## 2015-01-12 NOTE — Telephone Encounter (Signed)
HHRN advised of MD's recommendation and states that she believes pt has increased fatigue but would be all right to wait until appointment. Pt does have another [facility] visit this week, office will be informed if pt's condition worsens.

## 2015-01-17 DIAGNOSIS — F341 Dysthymic disorder: Secondary | ICD-10-CM | POA: Diagnosis not present

## 2015-01-17 DIAGNOSIS — M1990 Unspecified osteoarthritis, unspecified site: Secondary | ICD-10-CM | POA: Diagnosis not present

## 2015-01-17 DIAGNOSIS — J984 Other disorders of lung: Secondary | ICD-10-CM | POA: Diagnosis not present

## 2015-01-17 DIAGNOSIS — I10 Essential (primary) hypertension: Secondary | ICD-10-CM | POA: Diagnosis not present

## 2015-01-17 DIAGNOSIS — R269 Unspecified abnormalities of gait and mobility: Secondary | ICD-10-CM | POA: Diagnosis not present

## 2015-01-17 DIAGNOSIS — R531 Weakness: Secondary | ICD-10-CM | POA: Diagnosis not present

## 2015-01-19 ENCOUNTER — Other Ambulatory Visit (INDEPENDENT_AMBULATORY_CARE_PROVIDER_SITE_OTHER): Payer: Medicare Other

## 2015-01-19 ENCOUNTER — Encounter: Payer: Self-pay | Admitting: Internal Medicine

## 2015-01-19 ENCOUNTER — Ambulatory Visit (INDEPENDENT_AMBULATORY_CARE_PROVIDER_SITE_OTHER): Payer: Medicare Other | Admitting: Internal Medicine

## 2015-01-19 VITALS — BP 128/82 | HR 69 | Temp 98.0°F | Ht 65.0 in | Wt 177.0 lb

## 2015-01-19 DIAGNOSIS — I10 Essential (primary) hypertension: Secondary | ICD-10-CM | POA: Diagnosis not present

## 2015-01-19 DIAGNOSIS — R5383 Other fatigue: Secondary | ICD-10-CM | POA: Diagnosis not present

## 2015-01-19 DIAGNOSIS — R300 Dysuria: Secondary | ICD-10-CM

## 2015-01-19 DIAGNOSIS — E038 Other specified hypothyroidism: Secondary | ICD-10-CM | POA: Diagnosis not present

## 2015-01-19 LAB — BASIC METABOLIC PANEL
BUN: 22 mg/dL (ref 6–23)
CO2: 29 mEq/L (ref 19–32)
Calcium: 9.6 mg/dL (ref 8.4–10.5)
Chloride: 105 mEq/L (ref 96–112)
Creatinine, Ser: 1.28 mg/dL — ABNORMAL HIGH (ref 0.40–1.20)
GFR: 42.92 mL/min — ABNORMAL LOW (ref >60.00)
Glucose, Bld: 91 mg/dL (ref 70–99)
Potassium: 4.1 mEq/L (ref 3.5–5.1)
Sodium: 141 mEq/L (ref 135–145)

## 2015-01-19 LAB — URINALYSIS, ROUTINE W REFLEX MICROSCOPIC
BILIRUBIN URINE: NEGATIVE
HGB URINE DIPSTICK: NEGATIVE
Ketones, ur: NEGATIVE
LEUKOCYTES UA: NEGATIVE
NITRITE: NEGATIVE
Specific Gravity, Urine: >=1.03 — AB (ref 1.000–1.030)
Total Protein, Urine: NEGATIVE
UROBILINOGEN UA: 0.2 (ref 0.0–1.0)
Urine Glucose: NEGATIVE
pH: 5.5 (ref 5.0–8.0)

## 2015-01-19 LAB — HEPATIC FUNCTION PANEL
ALT: 9 U/L (ref 0–35)
AST: 15 U/L (ref 0–37)
Albumin: 4.1 g/dL (ref 3.5–5.2)
Alkaline Phosphatase: 61 U/L (ref 39–117)
BILIRUBIN TOTAL: 0.5 mg/dL (ref 0.2–1.2)
Bilirubin, Direct: 0.1 mg/dL (ref 0.0–0.3)
TOTAL PROTEIN: 7.3 g/dL (ref 6.0–8.3)

## 2015-01-19 LAB — CBC WITH DIFFERENTIAL/PLATELET
BASOS PCT: 0.6 % (ref 0.0–3.0)
Basophils Absolute: 0 10*3/uL (ref 0.0–0.1)
EOS ABS: 0.1 10*3/uL (ref 0.0–0.7)
Eosinophils Relative: 2 % (ref 0.0–5.0)
HCT: 34.4 % — ABNORMAL LOW (ref 36.0–46.0)
HEMOGLOBIN: 11.7 g/dL — AB (ref 12.0–15.0)
Lymphocytes Relative: 30 % (ref 12.0–46.0)
Lymphs Abs: 1.9 10*3/uL (ref 0.7–4.0)
MCHC: 34.1 g/dL (ref 30.0–36.0)
MCV: 97.6 fl (ref 78.0–100.0)
MONO ABS: 0.4 10*3/uL (ref 0.1–1.0)
MONOS PCT: 6.4 % (ref 3.0–12.0)
NEUTROS ABS: 3.8 10*3/uL (ref 1.4–7.7)
Neutrophils Relative %: 61 % (ref 43.0–77.0)
Platelets: 237 10*3/uL (ref 150.0–400.0)
RBC: 3.53 Mil/uL — ABNORMAL LOW (ref 3.87–5.11)
RDW: 13.6 % (ref 11.5–15.5)
WBC: 6.3 10*3/uL (ref 4.0–10.5)

## 2015-01-19 LAB — TSH: TSH: 1.48 u[IU]/mL (ref 0.35–4.50)

## 2015-01-19 LAB — T4, FREE: Free T4: 0.78 ng/dL (ref 0.60–1.60)

## 2015-01-19 NOTE — Progress Notes (Signed)
Subjective:    Patient ID: [name], female    DOB: Jul 06, 1935, 80 y.o.   MRN: [id number]  HPI  Here for yearly f/u;  Overall doing ok;  Pt denies Chest pain, worsening SOB, DOE, wheezing, orthopnea, PND, worsening LE edema, palpitations, dizziness or syncope.  Pt denies neurological change such as new headache, facial or extremity weakness.  Pt denies polydipsia, polyuria, or low sugar symptoms. Pt states overall good compliance with treatment and medications, good tolerability, and has been trying to follow appropriate diet.  Pt denies worsening depressive symptoms, suicidal ideation or panic. No fever, night sweats, wt loss, loss of appetite, or other constitutional symptoms.  Pt states good ability with ADL's, has low fall risk, home safety reviewed and adequate, no other significant changes in hearing or vision, and only occasionally active with exercise. Does c/o ongoing fatigue for several months, gradually worse. but denies signficant daytime hypersomnolence. Has new rolling walker with seat, and can walk as far a grocery ailse before sitting.   Pt denies fever, wt loss, night sweats, loss of appetite, or other constitutional symptoms  Did have a fall to her knees only last month, then fell adn broke her clothes basket, Falling now 2-3 times per wk for the past month Wt Readings from Last 3 Encounters:  01/19/15 177 lb (80.287 kg)  01/04/15 176 lb 9.6 oz (80.105 kg)  12/06/14 176 lb (79.833 kg)  Also has dysuria similar to prior UTI per pt, states tx last mo with last UTI. Denies urinary symptoms such as dysuria, but no frequency, urgency, flank pain, hematuria or n/v, fever, chills.  Denies hyper or hypo thyroid symptoms such as voice, skin or hair change.  Has seen card and pulm with no help with fatigue.  Did have thyroid med reduced from 100 to 37 - due for f/u.  Has no snoring at night, seems to have restorative sleep, though c/po not being to sleep well.  Has worsening tremors in the  am, has seen Dr [name]. Taking statin qhs but not every night., tends to forget it.  Has ongoing PT, last seen Monday June 20, with next PT session tomorrow.   Past Medical History  Diagnosis Date  . History of sudden visual loss   . Acute cystitis   . Bronchitis, acute   . Hypertension   . Cerebrovascular disease   . Hypothyroidism   . GERD (gastroesophageal reflux disease)   . Diverticulosis of colon   . DJD (degenerative joint disease)   . Back pain   . Transient ischemic attack   . Anxiety   . Depression   . Asthma   . COPD (chronic obstructive pulmonary disease)   . Shortness of breath     with exertion   . Pneumonia 2015    hx of x 2   . 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 UTI (lower urinary tract infection)     hx of   . Carotid artery occlusion   . Hyperlipidemia   . Aortic stenosis, mild 11/18/2013  . Pulmonary hypertension, moderate to severe 11/19/2014   Past Surgical History  Procedure Laterality Date  . Vaginal hysterectomy    . Sigmoid colectomy    . Tubal ligation    . Replacement total knee      right and left knee  . Cardiothoracic procedure  01/13/2009    Right   . Right foot surgery  nerve cut between toes   . Endartarectomy      right carotid endartarectomy - 2010  . Total knee arthroplasty Left 11/17/2012    Procedure: TOTAL KNEE ARTHROPLASTY;  Surgeon: Gearlean Alf, MD;  Location: [facility];  Service: Orthopedics;  Laterality: Left;  . Tee without cardioversion N/A 02/08/2014    Procedure: TRANSESOPHAGEAL ECHOCARDIOGRAM (TEE);  Surgeon: Larey Dresser, MD;  Location: [facility];  Service: Cardiovascular;  Laterality: N/A;  . Cardiac catheterization  02/18/14  . Left and right heart catheterization with coronary angiogram N/A 02/18/2014    Procedure: LEFT AND RIGHT HEART CATHETERIZATION WITH CORONARY ANGIOGRAM;  Surgeon: Larey Dresser, MD;  Location: [facility] CATH [facility];  Service: Cardiovascular;  Laterality: N/A;    reports that she has  never smoked. She has never used smokeless tobacco. She reports that she does not drink alcohol or use illicit drugs. family history includes Coronary artery disease in her sister; Emphysema in her father; Heart attack in her brother; Heart disease in her mother and sister; Rheumatic fever in her brother. Allergies  Allergen Reactions  . Relafen [Nabumetone] Diarrhea and Other (See Comments)    GI / Urinary Bleeding  . Other Other (See Comments)    "orvail" unknown:  Causes Bloody stool  . Lipitor [Atorvastatin]     Causes memory loss  . Penicillins Hives, Itching, Swelling and Rash   Current Outpatient Prescriptions on File Prior to Visit  Medication Sig Dispense Refill  . ADVAIR HFA 230-21 MCG/ACT inhaler Inhale 2 puffs into the lungs 2 (two) times daily. 1 Inhaler 3  . albuterol (PROVENTIL HFA;VENTOLIN HFA) 108 (90 BASE) MCG/ACT inhaler Inhale 2 puffs into the lungs every 6 (six) hours as needed for wheezing or shortness of breath. 3 Inhaler 3  . amLODipine (NORVASC) 5 MG tablet Take 1 tablet (5 mg total) by mouth daily. 90 tablet 3  . aspirin EC 81 MG tablet Take 1 tablet (81 mg total) by mouth daily. 90 tablet 3  . Cranberry-Vitamin C-Probiotic (AZO CRANBERRY) 250-30 MG TABS Take 1 capsule by mouth daily.    Marland Kitchen escitalopram (LEXAPRO) 10 MG tablet Take 1 tablet (10 mg total) by mouth daily. 90 tablet 3  . ferrous sulfate 325 (65 FE) MG tablet Take 325 mg by mouth daily with breakfast.    . fish oil-omega-3 fatty acids 1000 MG capsule Take 2 g by mouth daily.    Marland Kitchen levothyroxine (SYNTHROID, LEVOTHROID) 50 MCG tablet Take 1 tablet (50 mcg total) by mouth daily. 90 tablet 3  . nitrofurantoin, macrocrystal-monohydrate, (MACROBID) 100 MG capsule Take 1 capsule (100 mg total) by mouth 2 (two) times daily. 20 capsule 0  . Nutritional Supplements (OSTEO ADVANCE) TABS Take 2 tablets by mouth daily.     Marland Kitchen omeprazole (PRILOSEC) 20 MG capsule Take 1 capsule (20 mg total) by mouth daily. 90 capsule 3    . simvastatin (ZOCOR) 20 MG tablet Take 1 tablet (20 mg total) by mouth at bedtime. 90 tablet 3   No current facility-administered medications on file prior to visit.   Review of Systems  Constitutional: Negative for unusual diaphoresis or night sweats HENT: Negative for ringing in ear or discharge Eyes: Negative for double vision or worsening visual disturbance.  Respiratory: Negative for choking and stridor.   Gastrointestinal: Negative for vomiting or other signifcant bowel change Genitourinary: Negative for hematuria or change in urine volume.  Musculoskeletal: Negative for other MSK pain or swelling Skin: Negative for color change and worsening wound.  Neurological: Negative for tremors and numbness other than noted  Psychiatric/Behavioral: Negative for decreased concentration or agitation other than above       Objective:   Physical Exam BP 128/82 mmHg  Pulse 69  Temp(Src) 98 F (36.7 C) (Oral)  Ht 5\' 5"  (1.651 m)  Wt 177 lb (80.287 kg)  BMI 29.45 kg/m2  SpO2 98% Wt Readings from Last 3 Encounters:  01/19/15 177 lb (80.287 kg)  01/04/15 176 lb 9.6 oz (80.105 kg)  12/06/14 176 lb (79.833 kg)   VS noted,  Constitutional: Pt appears in no significant distress HENT: Head: NCAT.  Right Ear: External ear normal.  Left Ear: External ear normal.  Eyes: . Pupils are equal, round, and reactive to light. Conjunctivae and EOM are normal Neck: Normal range of motion. Neck supple.  Cardiovascular: Normal rate and regular rhythm.   Pulmonary/Chest: Effort normal and breath sounds without rales or wheezing.  Abd:  Soft, NT, ND, + BS Neurological: Pt is alert. Not confused , motor grossly intact Skin: Skin is warm. No rash, no LE edema Psychiatric: Pt behavior is normal. No agitation.  not depressed affect  today  Lab Results  Component Value Date   WBC 6.1 11/19/2014   HGB 11.7* 11/19/2014   HCT 34.0* 11/19/2014   PLT 236.0 11/19/2014   GLUCOSE 85 11/19/2014   CHOL 196  12/06/2014   TRIG 108.0 12/06/2014   HDL 70.80 12/06/2014   LDLDIRECT 103.1 10/10/2011   LDLCALC 104* 12/06/2014   ALT 8 11/19/2014   AST 15 11/19/2014   NA 140 11/19/2014   K 4.2 11/19/2014   CL 103 11/19/2014   CREATININE 1.29* 11/19/2014   BUN 26* 11/19/2014   CO2 30 11/19/2014   TSH 0.05* 11/19/2014   INR 1.0 02/05/2014       Assessment & Plan:

## 2015-01-19 NOTE — Patient Instructions (Addendum)

## 2015-01-19 NOTE — Progress Notes (Signed)
Pre visit review using our clinic review tool, if applicable. No additional management support is needed unless otherwise documented below in the visit note. 

## 2015-01-19 NOTE — Assessment & Plan Note (Addendum)
Has mult comorbids, but most likely I suspect related to overall inactivity except for when PT sees her - o/w sits constantly, will check labs - r/o anemia , cont PT, cont current med,  to f/u any worsening symptoms or concerns  Note:  Total time for pt hx, exam, review of record with pt in the room, determination of diagnoses and plan for further eval and tx is > 40 min, with over 50% spent in coordination and counseling of patient

## 2015-01-19 NOTE — Assessment & Plan Note (Signed)
Exam benign, but cant r/o cystitis - for UA, cx

## 2015-01-19 NOTE — Assessment & Plan Note (Signed)
With recent thyroid med reduced by 50%, will need fu labs today, if low may need higher replacement, could be related to fatigue Lab Results  Component Value Date   TSH 0.05* 11/19/2014

## 2015-01-19 NOTE — Assessment & Plan Note (Signed)
stable overall by history and exam, recent data reviewed with pt, and pt to continue medical treatment as before,  to f/u any worsening symptoms or concerns BP Readings from Last 3 Encounters:  01/19/15 128/82  01/04/15 118/64  12/06/14 128/70

## 2015-01-20 DIAGNOSIS — J984 Other disorders of lung: Secondary | ICD-10-CM | POA: Diagnosis not present

## 2015-01-20 DIAGNOSIS — M1990 Unspecified osteoarthritis, unspecified site: Secondary | ICD-10-CM | POA: Diagnosis not present

## 2015-01-20 DIAGNOSIS — R531 Weakness: Secondary | ICD-10-CM | POA: Diagnosis not present

## 2015-01-20 DIAGNOSIS — R269 Unspecified abnormalities of gait and mobility: Secondary | ICD-10-CM | POA: Diagnosis not present

## 2015-01-20 DIAGNOSIS — F341 Dysthymic disorder: Secondary | ICD-10-CM | POA: Diagnosis not present

## 2015-01-20 DIAGNOSIS — I10 Essential (primary) hypertension: Secondary | ICD-10-CM | POA: Diagnosis not present

## 2015-01-20 LAB — URINE CULTURE
Colony Count: NO GROWTH
ORGANISM ID, BACTERIA: NO GROWTH

## 2015-01-24 DIAGNOSIS — R531 Weakness: Secondary | ICD-10-CM | POA: Diagnosis not present

## 2015-01-24 DIAGNOSIS — F341 Dysthymic disorder: Secondary | ICD-10-CM | POA: Diagnosis not present

## 2015-01-24 DIAGNOSIS — I10 Essential (primary) hypertension: Secondary | ICD-10-CM | POA: Diagnosis not present

## 2015-01-24 DIAGNOSIS — R269 Unspecified abnormalities of gait and mobility: Secondary | ICD-10-CM | POA: Diagnosis not present

## 2015-01-24 DIAGNOSIS — J984 Other disorders of lung: Secondary | ICD-10-CM | POA: Diagnosis not present

## 2015-01-24 DIAGNOSIS — M1990 Unspecified osteoarthritis, unspecified site: Secondary | ICD-10-CM | POA: Diagnosis not present

## 2015-01-27 DIAGNOSIS — F341 Dysthymic disorder: Secondary | ICD-10-CM | POA: Diagnosis not present

## 2015-01-27 DIAGNOSIS — J984 Other disorders of lung: Secondary | ICD-10-CM | POA: Diagnosis not present

## 2015-01-27 DIAGNOSIS — R531 Weakness: Secondary | ICD-10-CM | POA: Diagnosis not present

## 2015-01-27 DIAGNOSIS — R269 Unspecified abnormalities of gait and mobility: Secondary | ICD-10-CM | POA: Diagnosis not present

## 2015-01-27 DIAGNOSIS — M1990 Unspecified osteoarthritis, unspecified site: Secondary | ICD-10-CM | POA: Diagnosis not present

## 2015-01-27 DIAGNOSIS — I10 Essential (primary) hypertension: Secondary | ICD-10-CM | POA: Diagnosis not present

## 2015-01-31 DIAGNOSIS — J984 Other disorders of lung: Secondary | ICD-10-CM | POA: Diagnosis not present

## 2015-01-31 DIAGNOSIS — F341 Dysthymic disorder: Secondary | ICD-10-CM | POA: Diagnosis not present

## 2015-01-31 DIAGNOSIS — R269 Unspecified abnormalities of gait and mobility: Secondary | ICD-10-CM | POA: Diagnosis not present

## 2015-01-31 DIAGNOSIS — M1990 Unspecified osteoarthritis, unspecified site: Secondary | ICD-10-CM | POA: Diagnosis not present

## 2015-01-31 DIAGNOSIS — R531 Weakness: Secondary | ICD-10-CM | POA: Diagnosis not present

## 2015-01-31 DIAGNOSIS — I10 Essential (primary) hypertension: Secondary | ICD-10-CM | POA: Diagnosis not present

## 2015-02-03 DIAGNOSIS — F341 Dysthymic disorder: Secondary | ICD-10-CM | POA: Diagnosis not present

## 2015-02-03 DIAGNOSIS — J984 Other disorders of lung: Secondary | ICD-10-CM | POA: Diagnosis not present

## 2015-02-03 DIAGNOSIS — M1990 Unspecified osteoarthritis, unspecified site: Secondary | ICD-10-CM | POA: Diagnosis not present

## 2015-02-03 DIAGNOSIS — R531 Weakness: Secondary | ICD-10-CM | POA: Diagnosis not present

## 2015-02-03 DIAGNOSIS — I10 Essential (primary) hypertension: Secondary | ICD-10-CM | POA: Diagnosis not present

## 2015-02-03 DIAGNOSIS — R269 Unspecified abnormalities of gait and mobility: Secondary | ICD-10-CM | POA: Diagnosis not present

## 2015-02-09 ENCOUNTER — Telehealth: Payer: Self-pay | Admitting: Internal Medicine

## 2015-02-09 NOTE — Telephone Encounter (Signed)
Patients son called stating patient has a uti and she just wants an antibiotic sent in. I told him that she will need to be seen in order to get it. He says she will not come in. He thought if Dr. Jenny Reichmann could call her to tell her. I told he is out and can't call her. Can you please call her to discuss that she does need to be seen.

## 2015-02-09 NOTE — Telephone Encounter (Signed)
Pt advised to and transferred to make OV

## 2015-02-16 ENCOUNTER — Ambulatory Visit: Payer: Medicare Other | Admitting: Internal Medicine

## 2015-03-15 ENCOUNTER — Encounter: Payer: Self-pay | Admitting: Adult Health

## 2015-03-15 ENCOUNTER — Ambulatory Visit (INDEPENDENT_AMBULATORY_CARE_PROVIDER_SITE_OTHER): Payer: Medicare Other | Admitting: Adult Health

## 2015-03-15 ENCOUNTER — Ambulatory Visit: Payer: Medicare Other | Admitting: Adult Health

## 2015-03-15 VITALS — BP 139/70 | HR 60 | Ht 65.0 in | Wt 176.0 lb

## 2015-03-15 DIAGNOSIS — Z8673 Personal history of transient ischemic attack (TIA), and cerebral infarction without residual deficits: Secondary | ICD-10-CM | POA: Diagnosis not present

## 2015-03-15 DIAGNOSIS — R296 Repeated falls: Secondary | ICD-10-CM

## 2015-03-15 DIAGNOSIS — R251 Tremor, unspecified: Secondary | ICD-10-CM | POA: Diagnosis not present

## 2015-03-15 NOTE — Progress Notes (Signed)
I agree with the above plan 

## 2015-03-15 NOTE — Progress Notes (Signed)
[name]: [name] DOB: 28-Nov-1934  REASON FOR VISIT: follow up- history of TIA, resting tremor, frequent falls HISTORY FROM: [name]  HISTORY OF PRESENT ILLNESS: [name] is a 80 year old female with a history of a TIA event and resting tremor. She returns today for follow-up. The [name] continues to take aspirin for stroke prevention. Her blood pressure has been controlled. Today it is 139/70. She continues on Zocor for her cholesterol. Her primary care is managing her hypertension and hyperlipidemia. [name] reports that her tremor may be slightly worse. It is greater in the right hand than left. And her tremor tends to be worse in the mornings than in the afternoons. She states that it limits what she is able to do with her hands. At the last visit she reported that she had had several falls. She was referred to physical therapy. She states that she did go for several sessions that she did not feel well and did not finish. She states that she's had at least 4 falls since her last visit. She states that she's not suffered any significant injuries other than bruising. She returns today for an evaluation.  HISTORY 09/14/14: [name] is a 80 year old female with a history of TIA event and resting tremor. She returns today for follow-up. She is currently taking aspirin for stroke prevention. Her BP is controlled.Today it is 148/72. Her last LDL was 103, 1 year ago. She is currently taking a statin to help control her cholesterol. She has a tremor that has gotten worse. She states that it is primarily in the right hand but is now moving to the left. She states that if she tries to eat she has a hard time. According to the notes the [name] has tried taking xanax but she does not recall this. She states that anything she does with her hands causes her some issues due to the tremor. [name] states that she fell last Wednesday. She states that she lost her balance and fell but did not suffer any  injuries. She does not use an assistive device. However she states that she is looking to get a Rolator. She does have a walker at home that she uses when she gets up at night to go to the bathroom. In the last six month she has fallen 6-7 times. She states that her balance was affected after having the stroke. She denies any stroke like symptoms. She continues with the Lexapro and feels that it has offered good benefit. [name] was also in pulmonary rehab for her asthma. She is SOB while speaking with me.   HISTORY: PRIOR HPI (PS): 27 year Caucasian lady who had a transient episode of sudden onset dysarthria, left facial droop and drooling of saliva from the left corner of mouth, lasting 30 minutes on 01/03/13. She had stopped her aspirin recently for her knee surgery which has since been restarted after the episode. She went to [facility] emergency room and had a CT scan of the head which was unremarkable. She was advised admission and TIA workup t but the [name] refused both. She does have a history of stroke 4 years ago when she lost partial vision in the right eye and was found to have symptomatic carotid stenosis for which he underwent carotid endarterectomy. She is followed by Dr. Kellie Simmering and states her last carotid ultrasound done 2 months ago had shown some mild left ICA stenosis but I do not have those results. She was also on Lipitor  which she stopped 6 days ago for unclear reasons. She states her blood pressure is under good control. She does not smoke.   UPDATE 02/19/13 (LL): [name] returns to office for follow up, no neurologic complaints or episodes since last TIA on 01/03/13. She has stopped taking Lipitor due to side effect profile and she thinks her memory is worse since starting it. Her MRA neck showed no significant stenosis at either carotid bifurcation. MRI shows chronic microvascular changes. Her husband has been diagnosed with Alzheimer's and she is tearful talking about it. She is no  longer taking Xarelto for DVT prophylaxis after knee replacement, has gone back to 81 mg ASA.   UPDATE 08/25/13 (LL): She returns for follow up for TIA. She has had no recurrent TIA symptoms and has been feeling well. Since last visit she has developed a tremor in right hand, which is bothersome when she eats. She notices tremor in her right foot as well. She denies excessive caffeine use, only 1 cup in the am. She endorses a high level of stress and does not sleep well.   UPDATE 12/04/13 (LL): Since last visit, she has established care with Dr. Cathlean Cower as her PCP, and she continues to see Dr. Lenna Gilford for asthma. Dr. Jenny Reichmann started her on Lexapro for depression, she states she feels so much better, it has made a big difference. GDS score today is 4. The Xanax did not help her tremor. Her synthroid dose was decreased recently but she has not started to take the lower dose yet. She has not had any recurrent TIAs. She is tolerating daily aspirin well without any significant bruising  REVIEW OF SYSTEMS: Out of a complete 14 system review of symptoms, the [name] complains only of the following symptoms, and all other reviewed systems are negative.  Fatigue, excessive sweating, ringing in ears, cough, wheezing, shortness of breath, eye discharge, flushing, walking difficulty, neck pain, neck stiffness, weakness, tremors, agitation, bruise/bleed easily, anemia  ALLERGIES: Allergies  Allergen Reactions  . Relafen [Nabumetone] Diarrhea and Other (See Comments)    GI / Urinary Bleeding  . Other Other (See Comments)    "orvail" unknown:  Causes Bloody stool  . Lipitor [Atorvastatin]     Causes memory loss  . Penicillins Hives, Itching, Swelling and Rash    HOME MEDICATIONS: Outpatient Prescriptions Prior to Visit  Medication Sig Dispense Refill  . ADVAIR HFA 230-21 MCG/ACT inhaler Inhale 2 puffs into the lungs 2 (two) times daily. 1 Inhaler 3  . albuterol (PROVENTIL HFA;VENTOLIN HFA) 108 (90 BASE)  MCG/ACT inhaler Inhale 2 puffs into the lungs every 6 (six) hours as needed for wheezing or shortness of breath. 3 Inhaler 3  . amLODipine (NORVASC) 5 MG tablet Take 1 tablet (5 mg total) by mouth daily. 90 tablet 3  . aspirin EC 81 MG tablet Take 1 tablet (81 mg total) by mouth daily. 90 tablet 3  . Cranberry-Vitamin C-Probiotic (AZO CRANBERRY) 250-30 MG TABS Take 1 capsule by mouth daily.    Marland Kitchen escitalopram (LEXAPRO) 10 MG tablet Take 1 tablet (10 mg total) by mouth daily. 90 tablet 3  . ferrous sulfate 325 (65 FE) MG tablet Take 325 mg by mouth daily with breakfast.    . fish oil-omega-3 fatty acids 1000 MG capsule Take 2 g by mouth daily.    Marland Kitchen levothyroxine (SYNTHROID, LEVOTHROID) 50 MCG tablet Take 1 tablet (50 mcg total) by mouth daily. 90 tablet 3  . nitrofurantoin, macrocrystal-monohydrate, (MACROBID) 100 MG capsule Take  1 capsule (100 mg total) by mouth 2 (two) times daily. 20 capsule 0  . Nutritional Supplements (OSTEO ADVANCE) TABS Take 2 tablets by mouth daily.     Marland Kitchen omeprazole (PRILOSEC) 20 MG capsule Take 1 capsule (20 mg total) by mouth daily. 90 capsule 3  . simvastatin (ZOCOR) 20 MG tablet Take 1 tablet (20 mg total) by mouth at bedtime. 90 tablet 3   No facility-administered medications prior to visit.    PAST MEDICAL HISTORY: Past Medical History  Diagnosis Date  . History of sudden visual loss   . Acute cystitis   . Bronchitis, acute   . Hypertension   . Cerebrovascular disease   . Hypothyroidism   . GERD (gastroesophageal reflux disease)   . Diverticulosis of colon   . DJD (degenerative joint disease)   . Back pain   . Transient ischemic attack   . Anxiety   . Depression   . Asthma   . COPD (chronic obstructive pulmonary disease)   . Shortness of breath     with exertion   . Pneumonia 2015    hx of x 2   . 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 UTI (lower urinary tract infection)     hx of   . Carotid artery occlusion   . Hyperlipidemia   .  Aortic stenosis, mild 11/18/2013  . Pulmonary hypertension, moderate to severe 11/19/2014    PAST SURGICAL HISTORY: Past Surgical History  Procedure Laterality Date  . Vaginal hysterectomy    . Sigmoid colectomy    . Tubal ligation    . Replacement total knee      right and left knee  . Cardiothoracic procedure  01/13/2009    Right   . Right foot surgery       nerve cut between toes   . Endartarectomy      right carotid endartarectomy - 2010  . Total knee arthroplasty Left 11/17/2012    Procedure: TOTAL KNEE ARTHROPLASTY;  Surgeon: Gearlean Alf, MD;  Location: [facility];  Service: Orthopedics;  Laterality: Left;  . Tee without cardioversion N/A 02/08/2014    Procedure: TRANSESOPHAGEAL ECHOCARDIOGRAM (TEE);  Surgeon: Larey Dresser, MD;  Location: [place];  Service: Cardiovascular;  Laterality: N/A;  . Cardiac catheterization  02/18/14  . Left and right heart catheterization with coronary angiogram N/A 02/18/2014    Procedure: LEFT AND RIGHT HEART CATHETERIZATION WITH CORONARY ANGIOGRAM;  Surgeon: Larey Dresser, MD;  Location: [facility] CATH LAB;  Service: Cardiovascular;  Laterality: N/A;    FAMILY HISTORY: Family History  Problem Relation Age of Onset  . Emphysema Father   . Heart disease Mother   . Coronary artery disease Sister   . Heart disease Sister   . Rheumatic fever Brother     x2  . Heart attack Brother     SOCIAL HISTORY: Social History   Social History  . Marital Status: Married    Spouse Name: Journalist, newspaper  . Number of Children: 3  . Years of Education: N/A   Occupational History  . Not on file.   Social History Main Topics  . Smoking status: Never Smoker   . Smokeless tobacco: Never Used  . Alcohol Use: No  . Drug Use: No  . Sexual Activity: Not on file   Other Topics Concern  . Not on file   Social History Narrative      PHYSICAL EXAM  Filed Vitals:   03/15/15  1557  BP: 139/70  Pulse: 60  Height: 5\' 5"  (1.651 m)  Weight: 176 lb (79.833 kg)     Body mass index is 29.29 kg/(m^2).  Generalized: Well developed, in no acute distress   Neurological examination  Mentation: Alert oriented to time, place, history taking. Follows all commands speech and language fluent Cranial nerve II-XII: Pupils were equal round reactive to light. Extraocular movements were full, visual field were full on confrontational test. Facial sensation and strength were normal. Uvula tongue midline. Head turning and shoulder shrug  were normal and symmetric. Motor: The motor testing reveals 5 over 5 strength of all 4 extremities. Good symmetric motor tone is noted throughout. Resting and intention tremor noted in the right hand. Mild intention tremor in the left. Mild resting tremor in the right foot. Sensory: Sensory testing is intact to soft touch on all 4 extremities. No evidence of extinction is noted.  Coordination: Cerebellar testing reveals good finger-nose-finger and heel-to-shin bilaterally.  Gait and station: requires assistance when going from a sitting to standing position. Gait is slow and cautious- she did not bring her walker with her today. Reflexes: Deep tendon reflexes are symmetric and normal bilaterally.   DIAGNOSTIC DATA (LABS, IMAGING, TESTING) - I reviewed [name] records, labs, notes, testing and imaging myself where available.  Lab Results  Component Value Date   WBC 6.3 01/19/2015   HGB 11.7* 01/19/2015   HCT 34.4* 01/19/2015   MCV 97.6 01/19/2015   PLT 237.0 01/19/2015      Component Value Date/Time   NA 141 01/19/2015 1458   K 4.1 01/19/2015 1458   CL 105 01/19/2015 1458   CO2 29 01/19/2015 1458   GLUCOSE 91 01/19/2015 1458   BUN 22 01/19/2015 1458   CREATININE 1.28* 01/19/2015 1458   CALCIUM 9.6 01/19/2015 1458   PROT 7.3 01/19/2015 1458   ALBUMIN 4.1 01/19/2015 1458   AST 15 01/19/2015 1458   ALT 9 01/19/2015 1458   ALKPHOS 61 01/19/2015 1458   BILITOT 0.5 01/19/2015 1458   GFRNONAA 27* 02/08/2014 1000   GFRAA 32*  02/08/2014 1000   Lab Results  Component Value Date   CHOL 196 12/06/2014   HDL 70.80 12/06/2014   LDLCALC 104* 12/06/2014   LDLDIRECT 103.1 10/10/2011   TRIG 108.0 12/06/2014   CHOLHDL 3 12/06/2014   No results found for: HGBA1C No results found for: VITAMINB12 Lab Results  Component Value Date   TSH 1.48 01/19/2015      ASSESSMENT AND PLAN 80 y.o. year old female  has a past medical history of History of sudden visual loss; Acute cystitis; Bronchitis, acute; Hypertension; Cerebrovascular disease; Hypothyroidism; GERD (gastroesophageal reflux disease); Diverticulosis of colon; DJD (degenerative joint disease); Back pain; Transient ischemic attack; Anxiety; Depression; Asthma; COPD (chronic obstructive pulmonary disease); Shortness of breath; Pneumonia (2015); Stroke; UTI (lower urinary tract infection); Carotid artery occlusion; Hyperlipidemia; Aortic stenosis, mild (11/18/2013); and Pulmonary hypertension, moderate to severe (11/19/2014). here with:  1. History of TIA 2. Tremor 3. Frequent falls  Continue aspirin 325 mg for stroke prevention and strict control of hypertension with blood pressure goal below 130/90 and lipids with LDL cholesterol goal below 100 mg percent. We will continue to monitor the tremor for now the [name] continues to have frequent falls. She is encouraged to use her walker at all times. [name] advised that if her symptoms worsen or she develops new symptoms she should let us know. She will follow-up in 6 months with Dr. [name].  Ward Givens, MSN, NP-C 03/15/2015, 3:59 PM [facility] [address] [place], [place] [address] [phone]  Note: This document was prepared with digital dictation and possible smart phrase technology. Any transcriptional errors that result from this process are unintentional.

## 2015-03-15 NOTE — Patient Instructions (Signed)
Continue aspirin 325 mg for stroke prevention and strict control of  blood pressure goal below 130/90 and lipids with LDL cholesterol goal below 100 mg percent.  Use walker at all times.  If your symptoms worsen or you develop new symptoms please let us know.

## 2015-04-09 ENCOUNTER — Other Ambulatory Visit: Payer: Self-pay | Admitting: Adult Health

## 2015-04-18 ENCOUNTER — Encounter: Payer: Self-pay | Admitting: Family

## 2015-04-19 ENCOUNTER — Encounter: Payer: Self-pay | Admitting: Family

## 2015-04-19 ENCOUNTER — Ambulatory Visit (INDEPENDENT_AMBULATORY_CARE_PROVIDER_SITE_OTHER): Payer: Medicare Other | Admitting: Family

## 2015-04-19 ENCOUNTER — Ambulatory Visit (HOSPITAL_COMMUNITY)
Admission: RE | Admit: 2015-04-19 | Discharge: 2015-04-19 | Disposition: A | Discharge status: Home / Self Care | Payer: Medicare Other | Source: Ambulatory Visit | Attending: Family | Admitting: Family

## 2015-04-19 VITALS — BP 157/69 | HR 63 | Temp 97.3°F | Resp 20 | Ht 65.0 in | Wt 177.4 lb

## 2015-04-19 DIAGNOSIS — I6523 Occlusion and stenosis of bilateral carotid arteries: Secondary | ICD-10-CM

## 2015-04-19 DIAGNOSIS — Z9889 Other specified postprocedural states: Secondary | ICD-10-CM

## 2015-04-19 DIAGNOSIS — I10 Essential (primary) hypertension: Secondary | ICD-10-CM | POA: Diagnosis not present

## 2015-04-19 DIAGNOSIS — E785 Hyperlipidemia, unspecified: Secondary | ICD-10-CM | POA: Insufficient documentation

## 2015-04-19 DIAGNOSIS — Z48812 Encounter for surgical aftercare following surgery on the circulatory system: Secondary | ICD-10-CM | POA: Diagnosis not present

## 2015-04-19 NOTE — Patient Instructions (Signed)
Stroke Prevention Some medical conditions and behaviors are associated with an increased chance of having a stroke. You may prevent a stroke by making healthy choices and managing medical conditions. HOW CAN I REDUCE MY RISK OF HAVING A STROKE?   Stay physically active. Get at least 30 minutes of activity on most or all days.  Do not smoke. It may also be helpful to avoid exposure to secondhand smoke.  Limit alcohol use. Moderate alcohol use is considered to be:  No more than 2 drinks per day for men.  No more than 1 drink per day for nonpregnant women.  Eat healthy foods. This involves:  Eating 5 or more servings of fruits and vegetables a day.  Making dietary changes that address high blood pressure (hypertension), high cholesterol, diabetes, or obesity.  Manage your cholesterol levels.  Making food choices that are high in fiber and low in saturated fat, trans fat, and cholesterol may control cholesterol levels.  Take any prescribed medicines to control cholesterol as directed by your health care provider.  Manage your diabetes.  Controlling your carbohydrate and sugar intake is recommended to manage diabetes.  Take any prescribed medicines to control diabetes as directed by your health care provider.  Control your hypertension.  Making food choices that are low in salt (sodium), saturated fat, trans fat, and cholesterol is recommended to manage hypertension.  Take any prescribed medicines to control hypertension as directed by your health care provider.  Maintain a healthy weight.  Reducing calorie intake and making food choices that are low in sodium, saturated fat, trans fat, and cholesterol are recommended to manage weight.  Stop drug abuse.  Avoid taking birth control pills.  Talk to your health care provider about the risks of taking birth control pills if you are over 35 years old, smoke, get migraines, or have ever had a blood clot.  Get evaluated for sleep  disorders (sleep apnea).  Talk to your health care provider about getting a sleep evaluation if you snore a lot or have excessive sleepiness.  Take medicines only as directed by your health care provider.  For some people, aspirin or blood thinners (anticoagulants) are helpful in reducing the risk of forming abnormal blood clots that can lead to stroke. If you have the irregular heart rhythm of atrial fibrillation, you should be on a blood thinner unless there is a good reason you cannot take them.  Understand all your medicine instructions.  Make sure that other conditions (such as anemia or atherosclerosis) are addressed. SEEK IMMEDIATE MEDICAL CARE IF:   You have sudden weakness or numbness of the face, arm, or leg, especially on one side of the body.  Your face or eyelid droops to one side.  You have sudden confusion.  You have trouble speaking (aphasia) or understanding.  You have sudden trouble seeing in one or both eyes.  You have sudden trouble walking.  You have dizziness.  You have a loss of balance or coordination.  You have a sudden, severe headache with no known cause.  You have new chest pain or an irregular heartbeat. Any of these symptoms may represent a serious problem that is an emergency. Do not wait to see if the symptoms will go away. Get medical help at once. Call your local emergency services (911 in [place] Do not drive yourself to the hospital. Document Released: 08/23/2004 Document Revised: 11/30/2013 Document Reviewed: 01/16/2013 ExitCare Patient Information 2015 [employer]. This information is not intended to replace advice given   to you by your health care provider. Make sure you discuss any questions you have with your health care provider.  

## 2015-04-19 NOTE — Progress Notes (Signed)
Established Carotid Patient   History of Present Illness  [name] is a 80 y.o. female patient of Dr. Kellie Simmering who is status post right carotid endarterectomy in 2010. She returns today for follow up. Right before her right CEA, she had a right retinal embolus with monocular loss of vision, right arm weakness and trembling, both of the above symptoms remain. She denies right leg weakness, denies expressive aphasia at that time.  She had a TIA about 2012 or 2013 as manifested by confusion and expressive aphasia, denies any further hemiparesis, denies any further loss of vision, denies any further TIA or stroke activity. She denies claudication symptoms with walking. She has had asthma since childhood.  Pt reports New Medical or Surgical History: none She was in pulmoary therapy. Her balance is off, has low energy. She sates that she declined an offer by Dr. Maureen Ralphs to get balance therapy.  Pt Diabetic: No Pt smoker: non-smoker  Pt meds include: Statin : Pt states she ran out, will discuss with her PCP that she thinks her current statin is causing her to have memory problems ASA: Yes Other anticoagulants/antiplatelets: no   Past Medical History  Diagnosis Date  . History of sudden visual loss   . Acute cystitis   . Bronchitis, acute   . Hypertension   . Cerebrovascular disease   . Hypothyroidism   . GERD (gastroesophageal reflux disease)   . Diverticulosis of colon   . DJD (degenerative joint disease)   . Back pain   . Transient ischemic attack   . Anxiety   . Depression   . Asthma   . COPD (chronic obstructive pulmonary disease)   . Shortness of breath     with exertion   . Pneumonia 2015    hx of x 2   . 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 UTI (lower urinary tract infection)     hx of   . Carotid artery occlusion   . Hyperlipidemia   . Aortic stenosis, mild 11/18/2013  . Pulmonary hypertension, moderate to severe 11/19/2014  . Anemia     Social  History Social History  Substance Use Topics  . Smoking status: Never Smoker   . Smokeless tobacco: Never Used  . Alcohol Use: No    Family History Family History  Problem Relation Age of Onset  . Emphysema Father   . Heart disease Mother   . Hyperlipidemia Mother   . Hypertension Mother   . Coronary artery disease Sister   . Heart disease Sister   . Hypertension Sister   . Varicose Veins Sister   . AAA (abdominal aortic aneurysm) Sister   . Rheumatic fever Brother     x2  . Heart attack Brother     Surgical History Past Surgical History  Procedure Laterality Date  . Vaginal hysterectomy    . Sigmoid colectomy    . Tubal ligation    . Replacement total knee      right and left knee  . Cardiothoracic procedure  01/13/2009    Right   . Right foot surgery       nerve cut between toes   . Endartarectomy      right carotid endartarectomy - 2010  . Total knee arthroplasty Left 11/17/2012    Procedure: TOTAL KNEE ARTHROPLASTY;  Surgeon: Gearlean Alf, MD;  Location: [facility];  Service: Orthopedics;  Laterality: Left;  . Tee without cardioversion N/A 02/08/2014  Procedure: TRANSESOPHAGEAL ECHOCARDIOGRAM (TEE);  Surgeon: Larey Dresser, MD;  Location: [place];  Service: Cardiovascular;  Laterality: N/A;  . Cardiac catheterization  02/18/14  . Left and right heart catheterization with coronary angiogram N/A 02/18/2014    Procedure: LEFT AND RIGHT HEART CATHETERIZATION WITH CORONARY ANGIOGRAM;  Surgeon: Larey Dresser, MD;  Location: [facility] CATH LAB;  Service: Cardiovascular;  Laterality: N/A;    Allergies  Allergen Reactions  . Relafen [Nabumetone] Diarrhea and Other (See Comments)    GI / Urinary Bleeding  . Other Other (See Comments)    "orvail" unknown:  Causes Bloody stool  . Lipitor [Atorvastatin]     Causes memory loss  . Penicillins Hives, Itching, Swelling and Rash    Current Outpatient Prescriptions  Medication Sig Dispense Refill  . ADVAIR HFA 230-21  MCG/ACT inhaler Inhale 2 puffs into the lungs 2 (two) times daily. 1 Inhaler 3  . albuterol (PROVENTIL HFA;VENTOLIN HFA) 108 (90 BASE) MCG/ACT inhaler Inhale 2 puffs into the lungs every 6 (six) hours as needed for wheezing or shortness of breath. 3 Inhaler 3  . amLODipine (NORVASC) 5 MG tablet Take 1 tablet (5 mg total) by mouth daily. (Patient not taking: Reported on 04/19/2015) 90 tablet 3  . amLODipine (NORVASC) 5 MG tablet TAKE 1 TABLET BY MOUTH EVERY DAY 30 tablet 1  . aspirin EC 81 MG tablet Take 1 tablet (81 mg total) by mouth daily. 90 tablet 3  . Cranberry-Vitamin C-Probiotic (AZO CRANBERRY) 250-30 MG TABS Take 1 capsule by mouth daily.    . Diphenhydramine-APAP, sleep, (TYLENOL PM EXTRA STRENGTH PO) Take 1 tablet by mouth as needed.    Marland Kitchen escitalopram (LEXAPRO) 10 MG tablet Take 1 tablet (10 mg total) by mouth daily. 90 tablet 3  . ferrous sulfate 325 (65 FE) MG tablet Take 325 mg by mouth daily with breakfast.    . fish oil-omega-3 fatty acids 1000 MG capsule Take 2 g by mouth daily.    Marland Kitchen levothyroxine (SYNTHROID, LEVOTHROID) 50 MCG tablet Take 1 tablet (50 mcg total) by mouth daily. 90 tablet 3  . nitrofurantoin, macrocrystal-monohydrate, (MACROBID) 100 MG capsule Take 1 capsule (100 mg total) by mouth 2 (two) times daily. 20 capsule 0  . Nutritional Supplements (OSTEO ADVANCE) TABS Take 2 tablets by mouth daily.     Marland Kitchen omeprazole (PRILOSEC) 20 MG capsule Take 1 capsule (20 mg total) by mouth daily. 90 capsule 3  . simvastatin (ZOCOR) 20 MG tablet Take 1 tablet (20 mg total) by mouth at bedtime. (Patient not taking: Reported on 04/19/2015) 90 tablet 3   No current facility-administered medications for this visit.    Review of Systems : See HPI for pertinent positives and negatives.  Physical Examination  Filed Vitals:   04/19/15 1208 04/19/15 1209  BP: 146/66 157/69  Pulse: 63   Temp: 97.3 F (36.3 C)   TempSrc: Oral   Resp: 20   Height: 5\' 5"  (1.651 m)   Weight: 177 lb 6.4  oz (80.468 kg)   SpO2: 100%    Body mass index is 29.52 kg/(m^2).  General: WDWN female in NAD GAIT: slow and deliberate Eyes: PERRLA Pulmonary: Non-labored, CTAB, decreased air movement in posterior fields, Negative Rales, Positive rhonchi, & Negative wheezing.  Cardiac: regular rhythm, no detected murmur.  VASCULAR EXAM Carotid Bruits Right Left   Negative Negative   Aorta is not palpable. Radial pulses are 2+ palpable and equal.      LE Pulses Right Left  POPLITEAL not palpable  not palpable   POSTERIOR TIBIAL  2+ palpable  2+ palpable    DORSALIS PEDIS  ANTERIOR TIBIAL not palpable  1+ palpable     Gastrointestinal: soft, nontender, BS WNL, no r/g,no masses palpated.  Musculoskeletal: Negative muscle atrophy/wasting. M/S 4/5 throughout Extremities without ischemic changes.  Neurologic: A&O X 3; Appropriate Affect,  Speech is normal CN 2-12 intact, Pain and light touch intact in extremities, Motor exam as listed above. Resting tremor in right foot, pill rolling tremor in right hand.         Non-Invasive Vascular Imaging CAROTID DUPLEX 04/19/2015   CEREBROVASCULAR DUPLEX EVALUATION    INDICATION: Carotid artery disease     PREVIOUS INTERVENTION(S): Right carotid endarterectomy 01/02/2009    DUPLEX EXAM:     RIGHT  LEFT  Peak Systolic Velocities (cm/s) End Diastolic Velocities (cm/s) Plaque LOCATION Peak Systolic Velocities (cm/s) End Diastolic Velocities (cm/s) Plaque  81 13  CCA PROXIMAL 66 9   69 15  CCA MID 65 15   86 15  CCA DISTAL 58 8 HM  56 0  ECA 53 0   82 11 HT ICA PROXIMAL 33 8 HT  69 20  ICA MID 68 21   89 26  ICA DISTAL 56 18     NA ICA / CCA Ratio (PSV) 1.04  Antegrade  Vertebral Flow Antegrade   710 Brachial Systolic Pressure (mmHg) 626  Multiphasic (Subclavian  artery) Brachial Artery Waveforms Multiphasic (Subclavian artery)    Plaque Morphology:  HM = Homogeneous, HT = Heterogeneous, CP = Calcific Plaque, SP = Smooth Plaque, IP = Irregular Plaque  ADDITIONAL FINDINGS: Technically difficult exam due to heavy respirations and patient movement.    IMPRESSION: Patent right carotid endarterectomy site with evidence of mild hyperplasia in the surgical bulb. Left internal carotid artery velocities suggest a <40% stenosis.      Compared to the previous exam:  No significant change in comparison to the last exam on 04/19/2014.     Assessment: [name] is a 80 y.o. female who is status post right carotid endarterectomy in 2010. Right before her right CEA, she had a right retinal embolus with monocular loss of vision, right arm weakness and trembling, both of the above symptoms remain. She denies right leg weakness, denies expressive aphasia at that time.  She had a TIA about 2012 or 2013 as manifested by confusion and expressive aphasia, denies any further hemiparesis, denies any further loss of vision, denies any further TIA or stroke activity. Today's carotid duplex suggests a patent right carotid endarterectomy site with evidence of mild hyperplasia in the surgical bulb and minimal left ICA stenosis. No significant change in comparison to the last exam on 04/19/2014.    Plan: Follow-up in 1 year with Carotid Duplex.   I discussed in depth with the patient the nature of atherosclerosis, and emphasized the importance of maximal medical management including strict control of blood pressure, blood glucose, and lipid levels, obtaining regular exercise, and continued cessation of smoking.  The patient is aware that without maximal medical management the underlying atherosclerotic disease process will progress, limiting the benefit of any interventions. The patient was given information about stroke prevention and what symptoms should prompt the patient to  seek immediate medical care. Thank you for allowing [place] to participate in this patient's care.  Clemon Chambers, RN, MSN, FNP-C [facility] Office: [phone]  Clinic Physician: Early  04/19/2015 12:21 PM

## 2015-05-26 ENCOUNTER — Encounter: Payer: Self-pay | Admitting: *Deleted

## 2015-06-03 ENCOUNTER — Other Ambulatory Visit: Payer: Self-pay | Admitting: Cardiology

## 2015-06-21 DIAGNOSIS — M50122 Cervical disc disorder at C5-C6 level with radiculopathy: Secondary | ICD-10-CM | POA: Diagnosis not present

## 2015-06-21 DIAGNOSIS — M50322 Other cervical disc degeneration at C5-C6 level: Secondary | ICD-10-CM | POA: Diagnosis not present

## 2015-06-21 DIAGNOSIS — M542 Cervicalgia: Secondary | ICD-10-CM | POA: Diagnosis not present

## 2015-06-27 DIAGNOSIS — B078 Other viral warts: Secondary | ICD-10-CM | POA: Diagnosis not present

## 2015-06-27 DIAGNOSIS — D485 Neoplasm of uncertain behavior of skin: Secondary | ICD-10-CM | POA: Diagnosis not present

## 2015-06-28 ENCOUNTER — Encounter (HOSPITAL_COMMUNITY): Payer: Self-pay | Admitting: *Deleted

## 2015-06-28 ENCOUNTER — Emergency Department (HOSPITAL_COMMUNITY)
Admission: EM | Admit: 2015-06-28 | Discharge: 2015-06-28 | Disposition: A | Discharge status: Home / Self Care | Payer: Medicare Other | Attending: Emergency Medicine | Admitting: Emergency Medicine

## 2015-06-28 DIAGNOSIS — Z880 Allergy status to penicillin: Secondary | ICD-10-CM | POA: Insufficient documentation

## 2015-06-28 DIAGNOSIS — K219 Gastro-esophageal reflux disease without esophagitis: Secondary | ICD-10-CM | POA: Insufficient documentation

## 2015-06-28 DIAGNOSIS — J449 Chronic obstructive pulmonary disease, unspecified: Secondary | ICD-10-CM | POA: Insufficient documentation

## 2015-06-28 DIAGNOSIS — G8929 Other chronic pain: Secondary | ICD-10-CM | POA: Insufficient documentation

## 2015-06-28 DIAGNOSIS — Z7982 Long term (current) use of aspirin: Secondary | ICD-10-CM | POA: Diagnosis not present

## 2015-06-28 DIAGNOSIS — D649 Anemia, unspecified: Secondary | ICD-10-CM | POA: Diagnosis not present

## 2015-06-28 DIAGNOSIS — E785 Hyperlipidemia, unspecified: Secondary | ICD-10-CM | POA: Insufficient documentation

## 2015-06-28 DIAGNOSIS — N390 Urinary tract infection, site not specified: Secondary | ICD-10-CM

## 2015-06-28 DIAGNOSIS — Z8669 Personal history of other diseases of the nervous system and sense organs: Secondary | ICD-10-CM | POA: Insufficient documentation

## 2015-06-28 DIAGNOSIS — Z8701 Personal history of pneumonia (recurrent): Secondary | ICD-10-CM | POA: Diagnosis not present

## 2015-06-28 DIAGNOSIS — F419 Anxiety disorder, unspecified: Secondary | ICD-10-CM | POA: Diagnosis not present

## 2015-06-28 DIAGNOSIS — Z79899 Other long term (current) drug therapy: Secondary | ICD-10-CM | POA: Diagnosis not present

## 2015-06-28 DIAGNOSIS — E039 Hypothyroidism, unspecified: Secondary | ICD-10-CM | POA: Insufficient documentation

## 2015-06-28 DIAGNOSIS — M1990 Unspecified osteoarthritis, unspecified site: Secondary | ICD-10-CM | POA: Insufficient documentation

## 2015-06-28 DIAGNOSIS — Z8673 Personal history of transient ischemic attack (TIA), and cerebral infarction without residual deficits: Secondary | ICD-10-CM | POA: Insufficient documentation

## 2015-06-28 DIAGNOSIS — Z8742 Personal history of other diseases of the female genital tract: Secondary | ICD-10-CM | POA: Insufficient documentation

## 2015-06-28 DIAGNOSIS — I10 Essential (primary) hypertension: Secondary | ICD-10-CM | POA: Insufficient documentation

## 2015-06-28 DIAGNOSIS — M549 Dorsalgia, unspecified: Secondary | ICD-10-CM | POA: Diagnosis not present

## 2015-06-28 DIAGNOSIS — M542 Cervicalgia: Secondary | ICD-10-CM | POA: Insufficient documentation

## 2015-06-28 DIAGNOSIS — R52 Pain, unspecified: Secondary | ICD-10-CM | POA: Diagnosis not present

## 2015-06-28 DIAGNOSIS — Z9889 Other specified postprocedural states: Secondary | ICD-10-CM | POA: Diagnosis not present

## 2015-06-28 DIAGNOSIS — F329 Major depressive disorder, single episode, unspecified: Secondary | ICD-10-CM | POA: Diagnosis not present

## 2015-06-28 LAB — URINE MICROSCOPIC-ADD ON: RBC / HPF: NONE SEEN RBC/hpf (ref 0–5)

## 2015-06-28 LAB — CBC
HCT: 35.8 % — ABNORMAL LOW (ref 36.0–46.0)
HEMOGLOBIN: 12.2 g/dL (ref 12.0–15.0)
MCH: 33.6 pg (ref 26.0–34.0)
MCHC: 34.1 g/dL (ref 30.0–36.0)
MCV: 98.6 fL (ref 78.0–100.0)
PLATELETS: 274 10*3/uL (ref 150–400)
RBC: 3.63 MIL/uL — ABNORMAL LOW (ref 3.87–5.11)
RDW: 13.1 % (ref 11.5–15.5)
WBC: 9.7 10*3/uL (ref 4.0–10.5)

## 2015-06-28 LAB — BASIC METABOLIC PANEL
Anion gap: 9 (ref 5–15)
BUN: 34 mg/dL — ABNORMAL HIGH (ref 6–20)
CALCIUM: 9 mg/dL (ref 8.9–10.3)
CO2: 25 mmol/L (ref 22–32)
CREATININE: 1.42 mg/dL — AB (ref 0.44–1.00)
Chloride: 105 mmol/L (ref 101–111)
GFR, EST AFRICAN AMERICAN: 40 mL/min — AB (ref >60)
GFR, EST NON AFRICAN AMERICAN: 35 mL/min — AB (ref >60)
GLUCOSE: 112 mg/dL — AB (ref 65–99)
Potassium: 4.1 mmol/L (ref 3.5–5.1)
Sodium: 139 mmol/L (ref 135–145)

## 2015-06-28 LAB — URINALYSIS, ROUTINE W REFLEX MICROSCOPIC
BILIRUBIN URINE: NEGATIVE
GLUCOSE, UA: NEGATIVE mg/dL
Hgb urine dipstick: NEGATIVE
KETONES UR: NEGATIVE mg/dL
NITRITE: POSITIVE — AB
PH: 5 (ref 5.0–8.0)
PROTEIN: NEGATIVE mg/dL
Specific Gravity, Urine: 1.018 (ref 1.005–1.030)

## 2015-06-28 MED ORDER — METHOCARBAMOL 500 MG PO TABS: 500.0000 mg | ORAL_TABLET | Freq: Two times a day (BID) | ORAL | Status: DC | PRN

## 2015-06-28 MED ORDER — FENTANYL CITRATE (PF) 100 MCG/2ML IJ SOLN
50.0000 ug | Freq: Once | INTRAMUSCULAR | Status: AC
  Administered 2015-06-28: 25 ug via INTRAVENOUS
  Filled 2015-06-28: qty 2

## 2015-06-28 MED ORDER — METHOCARBAMOL 500 MG PO TABS
500.0000 mg | ORAL_TABLET | Freq: Once | ORAL | Status: AC
  Administered 2015-06-28: 500 mg via ORAL
  Filled 2015-06-28: qty 1

## 2015-06-28 MED ORDER — SULFAMETHOXAZOLE-TRIMETHOPRIM 800-160 MG PO TABS: 1.0000 | ORAL_TABLET | Freq: Two times a day (BID) | ORAL | Status: AC

## 2015-06-28 MED ORDER — SULFAMETHOXAZOLE-TRIMETHOPRIM 800-160 MG PO TABS
1.0000 | ORAL_TABLET | Freq: Once | ORAL | Status: AC
  Administered 2015-06-28: 1 via ORAL
  Filled 2015-06-28: qty 1

## 2015-06-28 NOTE — ED Notes (Signed)
Bed: [facility] Expected date:  Expected time:  Means of arrival:  Comments: EMS 80 yo female from home, neck pain and muscle spasms for 3 weeks, DJD, seen by ortho for same, Fentanyl 50 mcg

## 2015-06-28 NOTE — ED Notes (Signed)
3 weeks ago neck pain started.  Pt went to [facility] and got xrays, showing DDD in C5-C6. her next appt is on this Thursday. She was in too much pain today to wait till then. She has Norco, Xanaflex, & Valium & prednisone taper (Rx back from 06/21/15).

## 2015-06-28 NOTE — ED Notes (Signed)
Back pain rad to Lt shoulder PTA.  Pt reports pain meds aren't enough at home.  14mcg of Fentanyl given PTA and pt sts pain is 5/10 but sats are 93% on RA now.

## 2015-06-28 NOTE — Discharge Instructions (Signed)
Taking muscle relaxant medicines in addition to the pain medicines may cause reduced ability to breath - this can be a severe and life threatening problem - please limit the use of the robaxin to one dose every 12 hours as needed.  Please obtain all of your results from medical records or have your doctors office obtain the results - share them with your doctor - you should be seen at your doctors office in the next 2 days. Call today to arrange your follow up. Take the medications as prescribed. Please review all of the medicines and only take them if you do not have an allergy to them. Please be aware that if you are taking birth control pills, taking other prescriptions, ESPECIALLY ANTIBIOTICS may make the birth control ineffective - if this is the case, either do not engage in sexual activity or use alternative methods of birth control such as condoms until you have finished the medicine and your family doctor says it is OK to restart them. If you are on a blood thinner such as COUMADIN, be aware that any other medicine that you take may cause the coumadin to either work too much, or not enough - you should have your coumadin level rechecked in next 7 days if this is the case.  ?  It is also a possibility that you have an allergic reaction to any of the medicines that you have been prescribed - Everybody reacts differently to medications and while MOST people have no trouble with most medicines, you may have a reaction such as nausea, vomiting, rash, swelling, shortness of breath. If this is the case, please stop taking the medicine immediately and contact your physician.  ?  You should return to the ER if you develop severe or worsening symptoms.

## 2015-06-28 NOTE — ED Provider Notes (Signed)
CSN: [id number]     Arrival date & time 06/28/15  1938 History   First MD Initiated Contact with Patient 06/28/15 2018     Chief Complaint  Patient presents with  . Neck Pain    DDD per [facility] dx (C5-C6)     (Consider location/radiation/quality/duration/timing/severity/associated sxs/prior Treatment) HPI Comments: The patient is a 80 year old female, she has a known history of severe chronic neck pain, she has already seen at [facility] where she received an MRI this week for her severe neck pain. This is chronic neck pain, mostly on the left, radiates in a sharp and shooting and burning sensation down into her elbow, seems to be worse with certain arm movements, never goes away. She has been using hydrocodone 5 mg tablets as well as recent course of prednisone which she states did help however after the prednisone. Her pain came back. She was given fentanyl by the paramedics, this improved her pain as well. The family states that they would like her to have better pain control until she can be seen within 48 hours at her orthopedics office. They are unsure about her MRI results, she has never had cervical spine surgery.  Patient is a 80 y.o. female presenting with neck pain. The history is provided by the patient.  Neck Pain   Past Medical History  Diagnosis Date  . History of sudden visual loss   . Acute cystitis   . Bronchitis, acute   . Hypertension   . Cerebrovascular disease   . Hypothyroidism   . GERD (gastroesophageal reflux disease)   . Diverticulosis of colon   . DJD (degenerative joint disease)   . Back pain   . Transient ischemic attack   . Anxiety   . Depression   . Asthma   . COPD (chronic obstructive pulmonary disease) ([place])   . Shortness of breath     with exertion   . Pneumonia 2015    hx of x 2   . 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 UTI (lower urinary tract infection)     hx of   . Carotid artery occlusion   .  Hyperlipidemia   . Aortic stenosis, mild 11/18/2013  . Pulmonary hypertension, moderate to severe ([place]) 11/19/2014  . Anemia    Past Surgical History  Procedure Laterality Date  . Vaginal hysterectomy    . Sigmoid colectomy    . Tubal ligation    . Replacement total knee      right and left knee  . Cardiothoracic procedure  01/13/2009    Right   . Right foot surgery       nerve cut between toes   . Endartarectomy      right carotid endartarectomy - 2010  . Total knee arthroplasty Left 11/17/2012    Procedure: TOTAL KNEE ARTHROPLASTY;  Surgeon: Gearlean Alf, MD;  Location: [facility];  Service: Orthopedics;  Laterality: Left;  . Tee without cardioversion N/A 02/08/2014    Procedure: TRANSESOPHAGEAL ECHOCARDIOGRAM (TEE);  Surgeon: Larey Dresser, MD;  Location: [facility];  Service: Cardiovascular;  Laterality: N/A;  . Cardiac catheterization  02/18/14  . Left and right heart catheterization with coronary angiogram N/A 02/18/2014    Procedure: LEFT AND RIGHT HEART CATHETERIZATION WITH CORONARY ANGIOGRAM;  Surgeon: Larey Dresser, MD;  Location: [facility] CATH LAB;  Service: Cardiovascular;  Laterality: N/A;   Family History  Problem Relation Age of Onset  .  Emphysema Father   . Heart disease Mother   . Hyperlipidemia Mother   . Hypertension Mother   . Coronary artery disease Sister   . Heart disease Sister   . Hypertension Sister   . Varicose Veins Sister   . AAA (abdominal aortic aneurysm) Sister   . Rheumatic fever Brother     x2  . Heart attack Brother    Social History  Substance Use Topics  . Smoking status: Never Smoker   . Smokeless tobacco: Never Used  . Alcohol Use: No   OB History    No data available     Review of Systems  Musculoskeletal: Positive for neck pain.  All other systems reviewed and are negative.     Allergies  Relafen; Other; Lipitor; and Penicillins  Home Medications   Prior to Admission medications   Medication Sig Start Date End Date  Taking? Authorizing Provider  albuterol (PROVENTIL HFA;VENTOLIN HFA) 108 (90 BASE) MCG/ACT inhaler Inhale 2 puffs into the lungs every 6 (six) hours as needed for wheezing or shortness of breath. 11/19/14  Yes Biagio Borg, MD  amLODipine (NORVASC) 5 MG tablet Take 1 tablet (5 mg total) by mouth daily. 05/20/14  Yes Biagio Borg, MD  aspirin EC 81 MG tablet Take 1 tablet (81 mg total) by mouth daily. 01/26/14  Yes Larey Dresser, MD  Cranberry-Vitamin C-Probiotic (AZO CRANBERRY) 250-30 MG TABS Take 1 capsule by mouth daily.   Yes Historical Provider, MD  Diphenhydramine-APAP, sleep, (TYLENOL PM EXTRA STRENGTH PO) Take 1 tablet by mouth as needed (sleep/ pain).    Yes Historical Provider, MD  escitalopram (LEXAPRO) 10 MG tablet Take 1 tablet (10 mg total) by mouth daily. 05/20/14  Yes Biagio Borg, MD  ferrous sulfate 325 (65 FE) MG tablet Take 325 mg by mouth daily with breakfast.   Yes Historical Provider, MD  fish oil-omega-3 fatty acids 1000 MG capsule Take 1 g by mouth daily.    Yes Historical Provider, MD  HYDROcodone-acetaminophen (NORCO) 10-325 MG tablet Take 1 tablet by mouth 3 (three) times daily as needed for moderate pain.  06/21/15  Yes Historical Provider, MD  levothyroxine (SYNTHROID, LEVOTHROID) 50 MCG tablet Take 1 tablet (50 mcg total) by mouth daily. 11/19/14  Yes Biagio Borg, MD  Nutritional Supplements (OSTEO ADVANCE) TABS Take 2 tablets by mouth daily.    Yes Historical Provider, MD  omeprazole (PRILOSEC) 20 MG capsule Take 1 capsule (20 mg total) by mouth daily. 08/12/14  Yes Biagio Borg, MD  tiZANidine (ZANAFLEX) 4 MG tablet Take 4 mg by mouth 3 (three) times daily as needed for muscle spasms.  06/21/15  Yes Historical Provider, MD  ADVAIR HFA 230-21 MCG/ACT inhaler Inhale 2 puffs into the lungs 2 (two) times daily. Patient not taking: Reported on 06/28/2015 11/23/14   Biagio Borg, MD  methocarbamol (ROBAXIN) 500 MG tablet Take 1 tablet (500 mg total) by mouth 2 (two) times  daily as needed for muscle spasms. 06/28/15   Noemi Chapel, MD  nitrofurantoin, macrocrystal-monohydrate, (MACROBID) 100 MG capsule Take 1 capsule (100 mg total) by mouth 2 (two) times daily. Patient not taking: Reported on 06/28/2015 11/19/14   Biagio Borg, MD  simvastatin (ZOCOR) 20 MG tablet Take 1 tablet (20 mg total) by mouth at bedtime. Patient not taking: Reported on 04/19/2015 05/20/14   Biagio Borg, MD  sulfamethoxazole-trimethoprim (BACTRIM DS,SEPTRA DS) 800-160 MG tablet Take 1 tablet by mouth 2 (two) times daily. 06/28/15  07/05/15  Noemi Chapel, MD   BP 142/57 mmHg  Pulse 74  Temp(Src) 98.5 F (36.9 C) (Oral)  Resp 18  SpO2 94% Physical Exam  Constitutional: She appears well-developed and well-nourished. No distress.  HENT:  Head: Normocephalic and atraumatic.  Mouth/Throat: Oropharynx is clear and moist. No oropharyngeal exudate.  Eyes: Conjunctivae and EOM are normal. Pupils are equal, round, and reactive to light. Right eye exhibits no discharge. Left eye exhibits no discharge. No scleral icterus.  Neck: Normal range of motion. Neck supple. No JVD present. No thyromegaly present.  Cardiovascular: Normal rate, regular rhythm, normal heart sounds and intact distal pulses.  Exam reveals no gallop and no friction rub.   No murmur heard. Pulmonary/Chest: Effort normal and breath sounds normal. No respiratory distress. She has no wheezes. She has no rales.  Abdominal: Soft. Bowel sounds are normal. She exhibits no distension and no mass. There is no tenderness.  Musculoskeletal: Normal range of motion. She exhibits no edema or tenderness.  Lymphadenopathy:    She has no cervical adenopathy.  Neurological: She is alert. Coordination normal.  Normal strength and sensation distal to the left shoulder, normal range of motion except for pain in the neck when she raises her left arm. She has normal dermatomal sensation as well as normal function of the hand, normal strength in all  dermatomes as well as radial, median and ulnar nerves.  Skin: Skin is warm and dry. No rash noted. No erythema.  Psychiatric: She has a normal mood and affect. Her behavior is normal.  Nursing note and vitals reviewed.   ED Course  Procedures (including critical care time) Labs Review Labs Reviewed  URINALYSIS, ROUTINE W REFLEX MICROSCOPIC (NOT AT [facility]) - Abnormal; Notable for the following:    APPearance CLOUDY (*)    Nitrite POSITIVE (*)    Leukocytes, UA MODERATE (*)    All other components within normal limits  BASIC METABOLIC PANEL - Abnormal; Notable for the following:    Glucose, Bld 112 (*)    BUN 34 (*)    Creatinine, Ser 1.42 (*)    GFR calc non Af Amer 35 (*)    GFR calc Af Amer 40 (*)    All other components within normal limits  CBC - Abnormal; Notable for the following:    RBC 3.63 (*)    HCT 35.8 (*)    All other components within normal limits  URINE MICROSCOPIC-ADD ON - Abnormal; Notable for the following:    Squamous Epithelial / LPF 0-5 (*)    Bacteria, UA MANY (*)    All other components within normal limits    Imaging Review No results found. I have personally reviewed and evaluated these images and lab results as part of my medical decision-making.    MDM   Final diagnoses:  Neck pain  UTI (lower urinary tract infection)    The patient has vital signs which are unremarkable, she is not short of breath, oxygen 93% but no abnormal lung sounds.  I suspect that she has a radiculopathy of her cervical spine, at this time she has no focal neurologic deficits, she has a follow up within 36-48 hours at [facility], at this time I do not think she needs more steroids however she will be given a couple of pills of increased strength hydrocodone, the family will administer this medication to make sure that she does not take too much, she will also be given Robaxin in short supply. She appears  stable for discharge, I do not think she needs a  neurosurgical consultation with a normal neurologic exam and chronic pain.   The pt and family has requested d/c despite being dx withi UTI and ongoing pain - I have offered them admission overnight - they want to go home - the daughter is working on placement as outpt.  Meds given in ED:  Medications  sulfamethoxazole-trimethoprim (BACTRIM DS,SEPTRA DS) 800-160 MG per tablet 1 tablet (not administered)  methocarbamol (ROBAXIN) tablet 500 mg (not administered)  fentaNYL (SUBLIMAZE) injection 50 mcg (25 mcg Intravenous Given 06/28/15 2134)    New Prescriptions   METHOCARBAMOL (ROBAXIN) 500 MG TABLET    Take 1 tablet (500 mg total) by mouth 2 (two) times daily as needed for muscle spasms.   SULFAMETHOXAZOLE-TRIMETHOPRIM (BACTRIM DS,SEPTRA DS) 800-160 MG TABLET    Take 1 tablet by mouth 2 (two) times daily.      Noemi Chapel, MD 06/28/15 [phone]

## 2015-06-28 NOTE — ED Notes (Signed)
Pt discharged in care of son and daughter.  Wheeled out to car in wheelchair.

## 2015-06-28 NOTE — ED Notes (Signed)
Delay in lab draw pt enroute to bathroom. 

## 2015-06-28 NOTE — ED Notes (Signed)
Discussed with MD Sabra Heck.  MD Sabra Heck reports can give 25 mcg only.

## 2015-06-30 DIAGNOSIS — M50122 Cervical disc disorder at C5-C6 level with radiculopathy: Secondary | ICD-10-CM | POA: Diagnosis not present

## 2015-06-30 DIAGNOSIS — M542 Cervicalgia: Secondary | ICD-10-CM | POA: Diagnosis not present

## 2015-06-30 DIAGNOSIS — M50322 Other cervical disc degeneration at C5-C6 level: Secondary | ICD-10-CM | POA: Diagnosis not present

## 2015-07-05 ENCOUNTER — Ambulatory Visit (INDEPENDENT_AMBULATORY_CARE_PROVIDER_SITE_OTHER): Payer: Medicare Other | Admitting: Internal Medicine

## 2015-07-05 ENCOUNTER — Other Ambulatory Visit (INDEPENDENT_AMBULATORY_CARE_PROVIDER_SITE_OTHER): Payer: Medicare Other

## 2015-07-05 VITALS — BP 132/66 | HR 70 | Temp 97.9°F | Resp 18 | Wt 176.4 lb

## 2015-07-05 DIAGNOSIS — F411 Generalized anxiety disorder: Secondary | ICD-10-CM | POA: Diagnosis not present

## 2015-07-05 DIAGNOSIS — I679 Cerebrovascular disease, unspecified: Secondary | ICD-10-CM

## 2015-07-05 DIAGNOSIS — N3000 Acute cystitis without hematuria: Secondary | ICD-10-CM

## 2015-07-05 DIAGNOSIS — I6523 Occlusion and stenosis of bilateral carotid arteries: Secondary | ICD-10-CM | POA: Diagnosis not present

## 2015-07-05 DIAGNOSIS — M542 Cervicalgia: Secondary | ICD-10-CM

## 2015-07-05 DIAGNOSIS — G8929 Other chronic pain: Secondary | ICD-10-CM

## 2015-07-05 DIAGNOSIS — W19XXXA Unspecified fall, initial encounter: Secondary | ICD-10-CM

## 2015-07-05 DIAGNOSIS — Z23 Encounter for immunization: Secondary | ICD-10-CM

## 2015-07-05 DIAGNOSIS — N390 Urinary tract infection, site not specified: Secondary | ICD-10-CM | POA: Insufficient documentation

## 2015-07-05 DIAGNOSIS — R5381 Other malaise: Secondary | ICD-10-CM

## 2015-07-05 HISTORY — DX: Unspecified fall, initial encounter: W19.XXXA

## 2015-07-05 LAB — URINALYSIS, ROUTINE W REFLEX MICROSCOPIC
Bilirubin Urine: NEGATIVE
Hgb urine dipstick: NEGATIVE
KETONES UR: NEGATIVE
Leukocytes, UA: NEGATIVE
Nitrite: NEGATIVE
PH: 5.5 (ref 5.0–8.0)
SPECIFIC GRAVITY, URINE: 1.025 (ref 1.000–1.030)
Total Protein, Urine: NEGATIVE
Urine Glucose: NEGATIVE
Urobilinogen, UA: 0.2 (ref 0.0–1.0)

## 2015-07-05 MED ORDER — DIAZEPAM 2 MG PO TABS: ORAL_TABLET | ORAL | Status: DC

## 2015-07-05 NOTE — Progress Notes (Signed)
Pre visit review using our clinic review tool, if applicable. No additional management support is needed unless otherwise documented below in the visit note. 

## 2015-07-05 NOTE — Progress Notes (Signed)
Subjective:    Patient ID: [name], female    DOB: September 24, 1934, 80 y.o.   MRN: [id number]  HPI  Here with daughter to f/u, pt states she is chronically miserable, in wheelchair looking fatigued and down, some HOH as well. C/o chronic pain, worse at the neck, has appt with Dr Eugenia Pancoast 11/15.  Due for flu shot.  Denies urinary symptoms such as dysuria, urgency, flank pain, hematuria or n/v, fever, chills., but has ongoing frequency worse in the past wk. No high fevers.  Denies worsening depressive symptoms, suicidal ideation, or panic; has ongoing anxiety, some increased recently. Daughter asks for soc services referral to begin outpatient placement process for SNF, with contact to be son Wreatha Jong, [phone].  Pt denies chest pain, increased sob or doe, wheezing, orthopnea, PND, increased LE swelling, palpitations, dizziness or syncope.  Pt denies new neurological symptoms such as new headache, or facial or extremity weakness or numbness   Pt denies polydipsia, polyuria Past Medical History  Diagnosis Date  . History of sudden visual loss   . Acute cystitis   . Bronchitis, acute   . Hypertension   . Cerebrovascular disease   . Hypothyroidism   . GERD (gastroesophageal reflux disease)   . Diverticulosis of colon   . DJD (degenerative joint disease)   . Back pain   . Transient ischemic attack   . Anxiety   . Depression   . Asthma   . COPD (chronic obstructive pulmonary disease) ([place])   . Shortness of breath     with exertion   . Pneumonia 2015    hx of x 2   . 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 UTI (lower urinary tract infection)     hx of   . Carotid artery occlusion   . Hyperlipidemia   . Aortic stenosis, mild 11/18/2013  . Pulmonary hypertension, moderate to severe ([place]) 11/19/2014  . Anemia    Past Surgical History  Procedure Laterality Date  . Vaginal hysterectomy    . Sigmoid colectomy    . Tubal ligation    . Replacement total knee     right and left knee  . Cardiothoracic procedure  01/13/2009    Right   . Right foot surgery       nerve cut between toes   . Endartarectomy      right carotid endartarectomy - 2010  . Total knee arthroplasty Left 11/17/2012    Procedure: TOTAL KNEE ARTHROPLASTY;  Surgeon: Gearlean Alf, MD;  Location: [facility];  Service: Orthopedics;  Laterality: Left;  . Tee without cardioversion N/A 02/08/2014    Procedure: TRANSESOPHAGEAL ECHOCARDIOGRAM (TEE);  Surgeon: Larey Dresser, MD;  Location: [facility];  Service: Cardiovascular;  Laterality: N/A;  . Cardiac catheterization  02/18/14  . Left and right heart catheterization with coronary angiogram N/A 02/18/2014    Procedure: LEFT AND RIGHT HEART CATHETERIZATION WITH CORONARY ANGIOGRAM;  Surgeon: Larey Dresser, MD;  Location: [facility] CATH LAB;  Service: Cardiovascular;  Laterality: N/A;    reports that she has never smoked. She has never used smokeless tobacco. She reports that she does not drink alcohol or use illicit drugs. family history includes AAA (abdominal aortic aneurysm) in her sister; Coronary artery disease in her sister; Emphysema in her father; Heart attack in her brother; Heart disease in her mother and sister; Hyperlipidemia in her mother; Hypertension in her mother and sister; Rheumatic fever in  her brother; Varicose Veins in her sister. Allergies  Allergen Reactions  . Relafen [Nabumetone] Diarrhea and Other (See Comments)    GI / Urinary Bleeding  . Other Other (See Comments)    "orvail" unknown:  Causes Bloody stool  . Lipitor [Atorvastatin]     Causes memory loss  . Penicillins Hives, Itching, Swelling and Rash    Has patient had a PCN reaction causing immediate rash, facial/tongue/throat swelling, SOB or lightheadedness with hypotension: no Has patient had a PCN reaction causing severe rash involving mucus membranes or skin necrosis: unknown Has patient had a PCN reaction that required hospitalization no Has patient had a PCN  reaction occurring within the last 10 years: no If all of the above answers are "NO", then may proceed with Cephalosporin use.    Current Outpatient Prescriptions on File Prior to Visit  Medication Sig Dispense Refill  . ADVAIR HFA 230-21 MCG/ACT inhaler Inhale 2 puffs into the lungs 2 (two) times daily. 1 Inhaler 3  . albuterol (PROVENTIL HFA;VENTOLIN HFA) 108 (90 BASE) MCG/ACT inhaler Inhale 2 puffs into the lungs every 6 (six) hours as needed for wheezing or shortness of breath. 3 Inhaler 3  . amLODipine (NORVASC) 5 MG tablet Take 1 tablet (5 mg total) by mouth daily. 90 tablet 3  . aspirin EC 81 MG tablet Take 1 tablet (81 mg total) by mouth daily. 90 tablet 3  . Cranberry-Vitamin C-Probiotic (AZO CRANBERRY) 250-30 MG TABS Take 1 capsule by mouth daily.    . Diphenhydramine-APAP, sleep, (TYLENOL PM EXTRA STRENGTH PO) Take 1 tablet by mouth as needed (sleep/ pain).     Marland Kitchen escitalopram (LEXAPRO) 10 MG tablet Take 1 tablet (10 mg total) by mouth daily. 90 tablet 3  . ferrous sulfate 325 (65 FE) MG tablet Take 325 mg by mouth daily with breakfast.    . fish oil-omega-3 fatty acids 1000 MG capsule Take 1 g by mouth daily.     Marland Kitchen HYDROcodone-acetaminophen (NORCO) 10-325 MG tablet Take 1 tablet by mouth 3 (three) times daily as needed for moderate pain.   0  . levothyroxine (SYNTHROID, LEVOTHROID) 50 MCG tablet Take 1 tablet (50 mcg total) by mouth daily. 90 tablet 3  . methocarbamol (ROBAXIN) 500 MG tablet Take 1 tablet (500 mg total) by mouth 2 (two) times daily as needed for muscle spasms. 10 tablet 0  . nitrofurantoin, macrocrystal-monohydrate, (MACROBID) 100 MG capsule Take 1 capsule (100 mg total) by mouth 2 (two) times daily. 20 capsule 0  . Nutritional Supplements (OSTEO ADVANCE) TABS Take 2 tablets by mouth daily.     Marland Kitchen omeprazole (PRILOSEC) 20 MG capsule Take 1 capsule (20 mg total) by mouth daily. 90 capsule 3  . simvastatin (ZOCOR) 20 MG tablet Take 1 tablet (20 mg total) by mouth at  bedtime. 90 tablet 3  . tiZANidine (ZANAFLEX) 4 MG tablet Take 4 mg by mouth 3 (three) times daily as needed for muscle spasms.   0   No current facility-administered medications on file prior to visit.   Review of Systems  Constitutional: Negative for unusual diaphoresis or night sweats HENT: Negative for ringing in ear or discharge Eyes: Negative for double vision or worsening visual disturbance.  Respiratory: Negative for choking and stridor.   Gastrointestinal: Negative for vomiting or other signifcant bowel change Genitourinary: Negative for hematuria or change in urine volume.  Musculoskeletal: Negative for other MSK pain or swelling Skin: Negative for color change and worsening wound.  Neurological: Negative for tremors  and numbness other than noted  Psychiatric/Behavioral: Negative for decreased concentration or agitation other than above       Objective:   Physical Exam BP 132/66 mmHg  Pulse 70  Temp(Src) 97.9 F (36.6 C) (Oral)  Resp 18  Wt 176 lb 6 oz (80.003 kg) VS noted,  Constitutional: Pt appears in no significant distress HENT: Head: NCAT.  Right Ear: External ear normal.  Left Ear: External ear normal.  Eyes: . Pupils are equal, round, and reactive to light. Conjunctivae and EOM are normal Neck: Normal range of motion. Neck supple.  Cardiovascular: Normal rate and regular rhythm.   Pulmonary/Chest: Effort normal and breath sounds without rales or wheezing.  Abd:  Soft, NT, ND, + BS, no flank tender Neurological: Pt is alert. Not confused , motor grossly intact Skin: Skin is warm. No rash, no LE edema Psychiatric: Pt behavior is  Not agitation, flat affect, fatigued and generallyt weak appearing. , 2+ nervous    Assessment & Plan:

## 2015-07-05 NOTE — Patient Instructions (Addendum)
You had the flu shot today  Please take all new medication as prescribed - the valium  Please finish your current antibiotic  Please go to the LAB in the Basement (turn left off the elevator) for the tests to be done after tomorrow for repeat urine testing  You will be contacted regarding the referral for: urology, and social services for the [facility] and SNF placement  Please continue all other medications as before, and refills have been done if requested.  Please have the pharmacy call with any other refills you may need.  Please keep your appointments with your specialists as you may have planned  Please return in 3 months, or sooner if needed

## 2015-07-06 ENCOUNTER — Telehealth: Payer: Self-pay | Admitting: Internal Medicine

## 2015-07-06 ENCOUNTER — Emergency Department (HOSPITAL_COMMUNITY): Payer: Medicare Other

## 2015-07-06 ENCOUNTER — Observation Stay (HOSPITAL_COMMUNITY)
Admission: EM | Admit: 2015-07-06 | Discharge: 2015-07-10 | Disposition: A | Discharge status: Skilled Nursing Facility | Payer: Medicare Other | Attending: Internal Medicine | Admitting: Internal Medicine

## 2015-07-06 ENCOUNTER — Encounter (HOSPITAL_COMMUNITY): Payer: Self-pay | Admitting: Emergency Medicine

## 2015-07-06 DIAGNOSIS — M5412 Radiculopathy, cervical region: Secondary | ICD-10-CM | POA: Diagnosis present

## 2015-07-06 DIAGNOSIS — R627 Adult failure to thrive: Secondary | ICD-10-CM | POA: Diagnosis not present

## 2015-07-06 DIAGNOSIS — R2689 Other abnormalities of gait and mobility: Secondary | ICD-10-CM | POA: Diagnosis not present

## 2015-07-06 DIAGNOSIS — R739 Hyperglycemia, unspecified: Secondary | ICD-10-CM | POA: Diagnosis present

## 2015-07-06 DIAGNOSIS — K219 Gastro-esophageal reflux disease without esophagitis: Secondary | ICD-10-CM | POA: Insufficient documentation

## 2015-07-06 DIAGNOSIS — I129 Hypertensive chronic kidney disease with stage 1 through stage 4 chronic kidney disease, or unspecified chronic kidney disease: Secondary | ICD-10-CM | POA: Diagnosis not present

## 2015-07-06 DIAGNOSIS — I739 Peripheral vascular disease, unspecified: Secondary | ICD-10-CM | POA: Insufficient documentation

## 2015-07-06 DIAGNOSIS — F419 Anxiety disorder, unspecified: Secondary | ICD-10-CM | POA: Insufficient documentation

## 2015-07-06 DIAGNOSIS — J449 Chronic obstructive pulmonary disease, unspecified: Secondary | ICD-10-CM | POA: Diagnosis not present

## 2015-07-06 DIAGNOSIS — R296 Repeated falls: Secondary | ICD-10-CM | POA: Diagnosis not present

## 2015-07-06 DIAGNOSIS — S79911A Unspecified injury of right hip, initial encounter: Secondary | ICD-10-CM | POA: Diagnosis not present

## 2015-07-06 DIAGNOSIS — M5136 Other intervertebral disc degeneration, lumbar region: Secondary | ICD-10-CM | POA: Insufficient documentation

## 2015-07-06 DIAGNOSIS — N179 Acute kidney failure, unspecified: Secondary | ICD-10-CM | POA: Diagnosis not present

## 2015-07-06 DIAGNOSIS — R269 Unspecified abnormalities of gait and mobility: Secondary | ICD-10-CM

## 2015-07-06 DIAGNOSIS — Z6828 Body mass index (BMI) 28.0-28.9, adult: Secondary | ICD-10-CM | POA: Diagnosis not present

## 2015-07-06 DIAGNOSIS — Y998 Other external cause status: Secondary | ICD-10-CM | POA: Diagnosis not present

## 2015-07-06 DIAGNOSIS — Z79899 Other long term (current) drug therapy: Secondary | ICD-10-CM | POA: Insufficient documentation

## 2015-07-06 DIAGNOSIS — S7001XA Contusion of right hip, initial encounter: Secondary | ICD-10-CM | POA: Diagnosis not present

## 2015-07-06 DIAGNOSIS — E039 Hypothyroidism, unspecified: Secondary | ICD-10-CM | POA: Diagnosis not present

## 2015-07-06 DIAGNOSIS — E785 Hyperlipidemia, unspecified: Secondary | ICD-10-CM | POA: Diagnosis not present

## 2015-07-06 DIAGNOSIS — W19XXXA Unspecified fall, initial encounter: Secondary | ICD-10-CM | POA: Diagnosis not present

## 2015-07-06 DIAGNOSIS — Z7982 Long term (current) use of aspirin: Secondary | ICD-10-CM | POA: Diagnosis not present

## 2015-07-06 DIAGNOSIS — N189 Chronic kidney disease, unspecified: Secondary | ICD-10-CM | POA: Insufficient documentation

## 2015-07-06 DIAGNOSIS — M25551 Pain in right hip: Secondary | ICD-10-CM | POA: Diagnosis not present

## 2015-07-06 DIAGNOSIS — F329 Major depressive disorder, single episode, unspecified: Secondary | ICD-10-CM | POA: Insufficient documentation

## 2015-07-06 DIAGNOSIS — S7000XA Contusion of unspecified hip, initial encounter: Secondary | ICD-10-CM | POA: Diagnosis present

## 2015-07-06 DIAGNOSIS — M47896 Other spondylosis, lumbar region: Secondary | ICD-10-CM | POA: Diagnosis not present

## 2015-07-06 DIAGNOSIS — M1990 Unspecified osteoarthritis, unspecified site: Secondary | ICD-10-CM | POA: Insufficient documentation

## 2015-07-06 DIAGNOSIS — Z880 Allergy status to penicillin: Secondary | ICD-10-CM | POA: Diagnosis not present

## 2015-07-06 DIAGNOSIS — Y9389 Activity, other specified: Secondary | ICD-10-CM | POA: Insufficient documentation

## 2015-07-06 DIAGNOSIS — M25559 Pain in unspecified hip: Secondary | ICD-10-CM

## 2015-07-06 DIAGNOSIS — I272 Other secondary pulmonary hypertension: Secondary | ICD-10-CM | POA: Insufficient documentation

## 2015-07-06 DIAGNOSIS — Y9289 Other specified places as the place of occurrence of the external cause: Secondary | ICD-10-CM | POA: Insufficient documentation

## 2015-07-06 DIAGNOSIS — H5442 Blindness, left eye, normal vision right eye: Secondary | ICD-10-CM | POA: Diagnosis not present

## 2015-07-06 DIAGNOSIS — M542 Cervicalgia: Secondary | ICD-10-CM | POA: Diagnosis not present

## 2015-07-06 DIAGNOSIS — G20 Parkinson's disease: Secondary | ICD-10-CM | POA: Diagnosis not present

## 2015-07-06 DIAGNOSIS — Z888 Allergy status to other drugs, medicaments and biological substances status: Secondary | ICD-10-CM | POA: Diagnosis not present

## 2015-07-06 DIAGNOSIS — I69398 Other sequelae of cerebral infarction: Secondary | ICD-10-CM | POA: Diagnosis not present

## 2015-07-06 DIAGNOSIS — F32A Depression, unspecified: Secondary | ICD-10-CM | POA: Diagnosis present

## 2015-07-06 DIAGNOSIS — M25521 Pain in right elbow: Secondary | ICD-10-CM | POA: Diagnosis not present

## 2015-07-06 DIAGNOSIS — G20A1 Parkinson's disease without dyskinesia, without mention of fluctuations: Secondary | ICD-10-CM | POA: Diagnosis present

## 2015-07-06 HISTORY — DX: Unspecified fall, initial encounter: W19.XXXA

## 2015-07-06 LAB — COMPREHENSIVE METABOLIC PANEL
ALK PHOS: 56 U/L (ref 38–126)
ALT: 16 U/L (ref 14–54)
AST: 22 U/L (ref 15–41)
Albumin: 3.9 g/dL (ref 3.5–5.0)
Anion gap: 8 (ref 5–15)
BUN: 21 mg/dL — AB (ref 6–20)
CHLORIDE: 104 mmol/L (ref 101–111)
CO2: 26 mmol/L (ref 22–32)
CREATININE: 1.76 mg/dL — AB (ref 0.44–1.00)
Calcium: 9.6 mg/dL (ref 8.9–10.3)
GFR calc Af Amer: 31 mL/min — ABNORMAL LOW (ref >60)
GFR, EST NON AFRICAN AMERICAN: 27 mL/min — AB (ref >60)
Glucose, Bld: 151 mg/dL — ABNORMAL HIGH (ref 65–99)
Potassium: 4.5 mmol/L (ref 3.5–5.1)
Sodium: 138 mmol/L (ref 135–145)
Total Bilirubin: 0.5 mg/dL (ref 0.3–1.2)
Total Protein: 7.1 g/dL (ref 6.5–8.1)

## 2015-07-06 LAB — URINE MICROSCOPIC-ADD ON: RBC / HPF: NONE SEEN RBC/hpf (ref 0–5)

## 2015-07-06 LAB — URINALYSIS, ROUTINE W REFLEX MICROSCOPIC
BILIRUBIN URINE: NEGATIVE
Glucose, UA: NEGATIVE mg/dL
HGB URINE DIPSTICK: NEGATIVE
Ketones, ur: NEGATIVE mg/dL
Nitrite: NEGATIVE
PH: 5.5 (ref 5.0–8.0)
Protein, ur: NEGATIVE mg/dL
SPECIFIC GRAVITY, URINE: 1.014 (ref 1.005–1.030)

## 2015-07-06 LAB — CBC WITH DIFFERENTIAL/PLATELET
BASOS ABS: 0.1 10*3/uL (ref 0.0–0.1)
Basophils Relative: 1 %
EOS PCT: 1 %
Eosinophils Absolute: 0.1 10*3/uL (ref 0.0–0.7)
HCT: 34.8 % — ABNORMAL LOW (ref 36.0–46.0)
HEMOGLOBIN: 11.6 g/dL — AB (ref 12.0–15.0)
LYMPHS PCT: 22 %
Lymphs Abs: 1.5 10*3/uL (ref 0.7–4.0)
MCH: 33 pg (ref 26.0–34.0)
MCHC: 33.3 g/dL (ref 30.0–36.0)
MCV: 98.9 fL (ref 78.0–100.0)
Monocytes Absolute: 0.4 10*3/uL (ref 0.1–1.0)
Monocytes Relative: 5 %
NEUTROS ABS: 5 10*3/uL (ref 1.7–7.7)
NEUTROS PCT: 71 %
PLATELETS: 225 10*3/uL (ref 150–400)
RBC: 3.52 MIL/uL — AB (ref 3.87–5.11)
RDW: 13.1 % (ref 11.5–15.5)
WBC: 7 10*3/uL (ref 4.0–10.5)

## 2015-07-06 NOTE — Assessment & Plan Note (Signed)
[place] for limited short term valium 2 mg bid prn,  to f/u any worsening symptoms or concerns

## 2015-07-06 NOTE — Assessment & Plan Note (Signed)
O/w stable, for cont asa 325

## 2015-07-06 NOTE — Assessment & Plan Note (Signed)
Ongoing issue, for f/u NS next wk as planned, declines further pain tx at this time

## 2015-07-06 NOTE — ED Provider Notes (Signed)
CSN: [id number]     Arrival date & time 07/06/15  1531 History   First MD Initiated Contact with Patient 07/06/15 1931     Chief Complaint  Patient presents with  . Fall     (Consider location/radiation/quality/duration/timing/severity/associated sxs/prior Treatment) HPI Comments: Patient with history of UTI, chronic neck pain due to degenerative disc disease with left-sided radicular features -- presents after having a fall 2 days ago. Patient fell in the middle the night onto her right side. She has developing bruising to her right elbow and right hip. She has been ambulatory, albeit slowly, at baseline with a walker. Patient denies headache, confusion, blurry vision. Patient is having increasing difficulties with ADLs and family is having difficulty caring for her at home. She reports increase in generalized weakness over the past several weeks. No fevers, URI symptoms, chest pain or shortness of breath. No abdominal pain, diarrhea or blood in stool. No urinary symptoms although family reports chronic frequency and incomplete emptying. No skin changes other than bruising. No lower extremity edema. Onset of symptoms acute. Course is constant. Pain is worse with movement. Nothing makes it better. Patient takes chronic hydrocodone which causes constipation.  Patient is a 80 y.o. female presenting with fall. The history is provided by the patient and a relative.  Fall Associated symptoms include arthralgias, myalgias and weakness (generalized). Pertinent negatives include no abdominal pain, chest pain, coughing, fever, headaches, nausea, rash, sore throat or vomiting.    Past Medical History  Diagnosis Date  . History of sudden visual loss   . Acute cystitis   . Bronchitis, acute   . Hypertension   . Cerebrovascular disease   . Hypothyroidism   . GERD (gastroesophageal reflux disease)   . Diverticulosis of colon   . DJD (degenerative joint disease)   . Back pain   . Transient ischemic  attack   . Anxiety   . Depression   . Asthma   . COPD (chronic obstructive pulmonary disease) ([place])   . Shortness of breath     with exertion   . Pneumonia 2015    hx of x 2   . 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 UTI (lower urinary tract infection)     hx of   . Carotid artery occlusion   . Hyperlipidemia   . Aortic stenosis, mild 11/18/2013  . Pulmonary hypertension, moderate to severe ([place]) 11/19/2014  . Anemia    Past Surgical History  Procedure Laterality Date  . Vaginal hysterectomy    . Sigmoid colectomy    . Tubal ligation    . Replacement total knee      right and left knee  . Cardiothoracic procedure  01/13/2009    Right   . Right foot surgery       nerve cut between toes   . Endartarectomy      right carotid endartarectomy - 2010  . Total knee arthroplasty Left 11/17/2012    Procedure: TOTAL KNEE ARTHROPLASTY;  Surgeon: Gearlean Alf, MD;  Location: [facility];  Service: Orthopedics;  Laterality: Left;  . Tee without cardioversion N/A 02/08/2014    Procedure: TRANSESOPHAGEAL ECHOCARDIOGRAM (TEE);  Surgeon: Larey Dresser, MD;  Location: [place];  Service: Cardiovascular;  Laterality: N/A;  . Cardiac catheterization  02/18/14  . Left and right heart catheterization with coronary angiogram N/A 02/18/2014    Procedure: LEFT AND RIGHT HEART CATHETERIZATION WITH CORONARY ANGIOGRAM;  Surgeon: Larey Dresser,  MD;  Location: [place] [facility]ATH [facility];  Service: Cardiovascular;  Laterality: N/A;   Family History  Problem Relation Age of Onset  . Emphysema Father   . Heart disease Mother   . Hyperlipidemia Mother   . Hypertension Mother   . Coronary artery disease Sister   . Heart disease Sister   . Hypertension Sister   . Varicose Veins Sister   . AAA (abdominal aortic aneurysm) Sister   . Rheumatic fever Brother     x2  . Heart attack Brother    Social History  Substance Use Topics  . Smoking status: Never Smoker   . Smokeless tobacco: Never Used  .  Alcohol Use: No   OB History    No data available     Review of Systems  Constitutional: Negative for fever.  HENT: Negative for rhinorrhea and sore throat.   Eyes: Negative for redness.  Respiratory: Negative for cough and shortness of breath.   Cardiovascular: Negative for chest pain.  Gastrointestinal: Negative for nausea, vomiting, abdominal pain and diarrhea.  Genitourinary: Positive for frequency. Negative for dysuria.  Musculoskeletal: Positive for myalgias and arthralgias.  Skin: Positive for color change (Bruising). Negative for rash.  Neurological: Positive for weakness (generalized). Negative for headaches.      Allergies  Relafen; Lipitor; Other; and Penicillins  Home Medications   Prior to Admission medications   Medication Sig Start Date End Date Taking? Authorizing Provider  ADVAIR HFA 230-21 MCG/ACT inhaler Inhale 2 puffs into the lungs 2 (two) times daily. 11/23/14  Yes Biagio Borg, MD  albuterol (PROVENTIL HFA;VENTOLIN HFA) 108 (90 BASE) MCG/ACT inhaler Inhale 2 puffs into the lungs every 6 (six) hours as needed for wheezing or shortness of breath. 11/19/14  Yes Biagio Borg, MD  amLODipine (NORVASC) 5 MG tablet Take 1 tablet (5 mg total) by mouth daily. 05/20/14  Yes Biagio Borg, MD  aspirin EC 81 MG tablet Take 1 tablet (81 mg total) by mouth daily. 01/26/14  Yes Larey Dresser, MD  Cranberry-Vitamin C-Probiotic (AZO CRANBERRY) 250-30 MG TABS Take 1 capsule by mouth daily.   Yes Historical Provider, MD  diazepam (VALIUM) 2 MG tablet 1/2 - 1 tab by mouth twice per day as needed 07/05/15  Yes Biagio Borg, MD  escitalopram (LEXAPRO) 10 MG tablet Take 1 tablet (10 mg total) by mouth daily. 05/20/14  Yes Biagio Borg, MD  ferrous sulfate 325 (65 FE) MG tablet Take 325 mg by mouth daily with breakfast.   Yes Historical Provider, MD  fish oil-omega-3 fatty acids 1000 MG capsule Take 1 g by mouth daily.    Yes Historical Provider, MD  levothyroxine (SYNTHROID,  LEVOTHROID) 50 MCG tablet Take 1 tablet (50 mcg total) by mouth daily. 11/19/14  Yes Biagio Borg, MD  methocarbamol (ROBAXIN) 500 MG tablet Take 1 tablet (500 mg total) by mouth 2 (two) times daily as needed for muscle spasms. 06/28/15  Yes Noemi Chapel, MD  Nutritional Supplements (OSTEO ADVANCE) TABS Take 2 tablets by mouth daily.    Yes Historical Provider, MD  omeprazole (PRILOSEC) 20 MG capsule Take 1 capsule (20 mg total) by mouth daily. 08/12/14  Yes Biagio Borg, MD  simvastatin (ZOCOR) 20 MG tablet Take 1 tablet (20 mg total) by mouth at bedtime. 05/20/14  Yes Biagio Borg, MD  tiZANidine (ZANAFLEX) 4 MG tablet Take 4 mg by mouth 3 (three) times daily as needed for muscle spasms.  06/21/15  Yes Historical Provider,  MD   BP 142/64 mmHg  Pulse 87  Temp(Src) 98.6 F (37 C) (Oral)  Resp 18  SpO2 100% Physical Exam  Constitutional: She appears well-developed and well-nourished.  HENT:  Head: Normocephalic and atraumatic.  Eyes: Conjunctivae are normal. Right eye exhibits no discharge. Left eye exhibits no discharge.  Neck: Normal range of motion. Neck supple.  Cardiovascular: Normal rate, regular rhythm and normal heart sounds.   Pulmonary/Chest: Effort normal and breath sounds normal.  Abdominal: Soft. There is no tenderness.  Musculoskeletal:       Right shoulder: Normal.       Right elbow: She exhibits normal range of motion, no swelling and no effusion. Tenderness found.       Right wrist: Normal.       Right hip: She exhibits tenderness (Laterally). She exhibits normal range of motion, normal strength and no bony tenderness.       Right knee: Normal.       Right ankle: Normal.       Cervical back: She exhibits tenderness.       Legs: Neurological: She is alert.  Skin: Skin is warm and dry.  Psychiatric: She has a normal mood and affect.  Nursing note and vitals reviewed.   ED Course  Procedures (including critical care time) Labs Review Labs Reviewed  CBC WITH  DIFFERENTIAL/PLATELET - Abnormal; Notable for the following:    RBC 3.52 (*)    Hemoglobin 11.6 (*)    HCT 34.8 (*)    All other components within normal limits  COMPREHENSIVE METABOLIC PANEL - Abnormal; Notable for the following:    Glucose, Bld 151 (*)    BUN 21 (*)    Creatinine, Ser 1.76 (*)    GFR calc non Af Amer 27 (*)    GFR calc Af Amer 31 (*)    All other components within normal limits  URINALYSIS, ROUTINE W REFLEX MICROSCOPIC (NOT AT [facility]) - Abnormal; Notable for the following:    APPearance CLOUDY (*)    Leukocytes, UA TRACE (*)    All other components within normal limits  URINE MICROSCOPIC-ADD ON - Abnormal; Notable for the following:    Squamous Epithelial / LPF 0-5 (*)    Bacteria, UA RARE (*)    Casts HYALINE CASTS (*)    Crystals TRIPLE PHOSPHATE CRYSTALS (*)    All other components within normal limits    Imaging Review Dg Elbow Complete Right  07/06/2015  CLINICAL DATA:  Fall 2 days ago, right elbow pain/bruising EXAM: RIGHT ELBOW - COMPLETE 3+ VIEW COMPARISON:  None. FINDINGS: No fracture or dislocation is seen. The joint spaces are preserved. Visualized soft tissues are within normal limits. No displaced elbow joint fat pads to suggest an elbow joint effusion. IMPRESSION: No fracture or dislocation is seen. Electronically Signed   By: Julian Hy M.D.   On: 07/06/2015 16:51   Dg Hip Unilat  With Pelvis 2-3 Views Right  07/06/2015  CLINICAL DATA:  Fall 2 days ago EXAM: DG HIP (WITH OR WITHOUT PELVIS) 2-3V RIGHT COMPARISON:  None FINDINGS: There is no evidence of hip fracture or dislocation. There is no evidence of arthropathy or other focal bone abnormality. IMPRESSION: 1. No fracture. 2. If there is high clinical suspicion for occult fracture or the patient refuses to weightbear, consider further evaluation with MRI. Although CT is expeditious, evidence is lacking regarding accuracy of CT over plain film radiography. Electronically Signed   By: Queen Slough.D.  On: 07/06/2015 16:53   I have personally reviewed and evaluated these images and lab results as part of my medical decision-making.   EKG Interpretation None      7:59 PM Patient seen and examined. Discussed with Dr. Roxanne Mins who will see. Informed of imaging results. Work-up initiated.   Vital signs reviewed and are as follows: BP 142/64 mmHg  Pulse 87  Temp(Src) 98.6 F (37 C) (Oral)  Resp 18  SpO2 100%  10:14 PM Pt seen previously by Dr. Roxanne Mins. MRI ordered.   Case manager to see patient and family to offer home health services as back-up as patient does not currently meet inpatient criteria for admission. Pending results of MRI.   12:59 AM case manager has seen patient and family. Family continues to voice significant concerns over caring for home. I discussed this with the hospitalist, who agrees to observation admission. Continuing to pend results of MRI.  MDM   Final diagnoses:  Fall  Hip pain  Acute kidney injury [facility])   Patient with contusion of right hip after falling, difficulty ambulation home. Family is unable to care for her. Mild acute kidney injury noted, otherwise negative workup. Awaiting results of MRI of hip to ensure no occult fracture. Observation admission for symptom control.    Carlisle Cater, PA-C [id number] [phone]  Delora Fuel, MD [id number]

## 2015-07-06 NOTE — Assessment & Plan Note (Addendum)
[place] for soc services referral ambulatory for SNF placement per family and pt request  Note:  Total time for pt hx, exam, review of record with pt in the room, determination of diagnoses and plan for further eval and tx is > 40 min, with over 50% spent in coordination and counseling of patient

## 2015-07-06 NOTE — Assessment & Plan Note (Signed)
Likely recurrent, for ua, antibx if abnormal, refer urology per family request due to several recurrences

## 2015-07-06 NOTE — Care Management (Addendum)
Patient here S/P fall at home yesterday evening and is experiencing increased hip and elbow pain.  Pt had epidural today for pain but had fall last night with bruising to right elbow and right hip.   Patient was seen at [facility] over 1 week ago per the son for neck pain and UTI. Per son, patient was seen by PCP today and they were instructed to come to the ED in order to be admitted for inpatient rehab.  CM received consult for discharge planning and [place] services. CM spoke to son and daughter in law who state that they are no longer able to care for the patient at their home. CM explained in depth the Medicare guidelines that in order to be placed to a SNF patient would need a 3 night inpatient stay. CM explained the difference between inpt and observation status in detail. Family grew increasingly upset and emotional feeling that she's not safe returning to previous living situation.   Baseline creatinine of 1.3 and creatinine today up to 1.76. MRI of Hip pending. Per family, prior to the last week was walking with walker independently; as of the last week is having more difficulty with ambulation and is needing walker.  ED evaluation in process. Updated ED physician who will then consult Hospitalist.

## 2015-07-06 NOTE — ED Notes (Signed)
Pt sts fall last night; pt living with family that are struggling to provide care due to back injury and pain; pt had epidural today for pain but had fall last night with bruising to right elbow and right hip

## 2015-07-06 NOTE — Telephone Encounter (Signed)
States that patient needs to be placed into a skilled nursing facility today.  States that patient nor family can provide the care that is needed for the patient.  States that Dr. Jenny Reichmann has to complete FL2 today to be sent over to [facility] so patient can be admitted.  States that patient has to be admitted to hospital for 3 days in order for medicare to cover.  States patient is in a lot of pain and is yelling because pain meds do not touch her pain.  States that patient has fallen several times and believes something is broke in patients arm.  I advised that patient should probably be taken over to ER.  Please follow up in regards.

## 2015-07-06 NOTE — Care Management (Signed)
Patient back in the room after MRI; CM went back to bedside and spoke to patient and family and explained that MD was reviewing the MRI and that the hospitalist would come in and speak with them. Patient and family expressed that patient normally lives with husband who has Alzheimers disease and she herself is starting to experience tremors and has become more unsteady. CM will continue to follow for needs.

## 2015-07-06 NOTE — Telephone Encounter (Signed)
This is the correct advice.  There are exceptions to the 3 midnight rule, but this would need to be addressed by social services while inpatient

## 2015-07-06 NOTE — Telephone Encounter (Signed)
Per pt's daughter-in-law, they will take pt to ER

## 2015-07-07 ENCOUNTER — Encounter (HOSPITAL_COMMUNITY): Payer: Self-pay | Admitting: General Practice

## 2015-07-07 ENCOUNTER — Observation Stay (HOSPITAL_COMMUNITY): Payer: Medicare Other

## 2015-07-07 DIAGNOSIS — R739 Hyperglycemia, unspecified: Secondary | ICD-10-CM | POA: Diagnosis present

## 2015-07-07 DIAGNOSIS — M5412 Radiculopathy, cervical region: Secondary | ICD-10-CM | POA: Diagnosis present

## 2015-07-07 DIAGNOSIS — G20 Parkinson's disease: Secondary | ICD-10-CM | POA: Diagnosis not present

## 2015-07-07 DIAGNOSIS — R296 Repeated falls: Secondary | ICD-10-CM

## 2015-07-07 DIAGNOSIS — I10 Essential (primary) hypertension: Secondary | ICD-10-CM | POA: Diagnosis not present

## 2015-07-07 DIAGNOSIS — S7001XA Contusion of right hip, initial encounter: Secondary | ICD-10-CM | POA: Diagnosis not present

## 2015-07-07 DIAGNOSIS — R627 Adult failure to thrive: Secondary | ICD-10-CM

## 2015-07-07 DIAGNOSIS — E039 Hypothyroidism, unspecified: Secondary | ICD-10-CM

## 2015-07-07 DIAGNOSIS — S7000XA Contusion of unspecified hip, initial encounter: Secondary | ICD-10-CM | POA: Diagnosis present

## 2015-07-07 DIAGNOSIS — F419 Anxiety disorder, unspecified: Secondary | ICD-10-CM

## 2015-07-07 DIAGNOSIS — R269 Unspecified abnormalities of gait and mobility: Secondary | ICD-10-CM

## 2015-07-07 DIAGNOSIS — F329 Major depressive disorder, single episode, unspecified: Secondary | ICD-10-CM | POA: Diagnosis present

## 2015-07-07 DIAGNOSIS — F418 Other specified anxiety disorders: Secondary | ICD-10-CM

## 2015-07-07 LAB — VITAMIN B12: Vitamin B-12: 602 pg/mL (ref 180–914)

## 2015-07-07 LAB — URINE CULTURE
Colony Count: NO GROWTH
Organism ID, Bacteria: NO GROWTH

## 2015-07-07 MED ORDER — ACETAMINOPHEN 325 MG PO TABS
650.0000 mg | ORAL_TABLET | Freq: Four times a day (QID) | ORAL | Status: DC | PRN
  Administered 2015-07-07: 650 mg via ORAL
  Filled 2015-07-07: qty 2

## 2015-07-07 MED ORDER — ACETAMINOPHEN 650 MG RE SUPP: 650.0000 mg | Freq: Four times a day (QID) | RECTAL | Status: DC | PRN

## 2015-07-07 MED ORDER — LEVOTHYROXINE SODIUM 50 MCG PO TABS
50.0000 ug | ORAL_TABLET | Freq: Every day | ORAL | Status: DC
  Administered 2015-07-07 – 2015-07-10 (×4): 50 ug via ORAL
  Filled 2015-07-07 (×5): qty 1

## 2015-07-07 MED ORDER — ENOXAPARIN SODIUM 30 MG/0.3ML ~~LOC~~ SOLN
30.0000 mg | SUBCUTANEOUS | Status: DC
  Administered 2015-07-07 – 2015-07-08 (×2): 30 mg via SUBCUTANEOUS
  Filled 2015-07-07 (×2): qty 0.3

## 2015-07-07 MED ORDER — ONDANSETRON HCL 4 MG PO TABS: 4.0000 mg | ORAL_TABLET | Freq: Four times a day (QID) | ORAL | Status: DC | PRN

## 2015-07-07 MED ORDER — ONDANSETRON HCL 4 MG/2ML IJ SOLN: 4.0000 mg | Freq: Four times a day (QID) | INTRAMUSCULAR | Status: DC | PRN

## 2015-07-07 MED ORDER — ASPIRIN EC 81 MG PO TBEC
81.0000 mg | DELAYED_RELEASE_TABLET | Freq: Every day | ORAL | Status: DC
  Administered 2015-07-07 – 2015-07-10 (×4): 81 mg via ORAL
  Filled 2015-07-07 (×4): qty 1

## 2015-07-07 MED ORDER — SENNOSIDES-DOCUSATE SODIUM 8.6-50 MG PO TABS
1.0000 | ORAL_TABLET | Freq: Every day | ORAL | Status: DC
  Administered 2015-07-07 – 2015-07-09 (×3): 1 via ORAL
  Filled 2015-07-07 (×4): qty 1

## 2015-07-07 MED ORDER — ALBUTEROL SULFATE HFA 108 (90 BASE) MCG/ACT IN AERS: 2.0000 | INHALATION_SPRAY | Freq: Four times a day (QID) | RESPIRATORY_TRACT | Status: DC | PRN

## 2015-07-07 MED ORDER — SODIUM CHLORIDE 0.9 % IV SOLN
INTRAVENOUS | Status: DC
  Administered 2015-07-07: 1000 mL via INTRAVENOUS
  Administered 2015-07-07 – 2015-07-08 (×3): via INTRAVENOUS

## 2015-07-07 MED ORDER — ALBUTEROL SULFATE (2.5 MG/3ML) 0.083% IN NEBU: 2.5000 mg | INHALATION_SOLUTION | Freq: Four times a day (QID) | RESPIRATORY_TRACT | Status: DC | PRN

## 2015-07-07 MED ORDER — SIMVASTATIN 20 MG PO TABS
20.0000 mg | ORAL_TABLET | Freq: Every day | ORAL | Status: DC
  Administered 2015-07-07: 20 mg via ORAL
  Filled 2015-07-07 (×2): qty 1

## 2015-07-07 MED ORDER — ESCITALOPRAM OXALATE 10 MG PO TABS
10.0000 mg | ORAL_TABLET | Freq: Every day | ORAL | Status: DC
  Administered 2015-07-07 – 2015-07-10 (×4): 10 mg via ORAL
  Filled 2015-07-07 (×4): qty 1

## 2015-07-07 MED ORDER — FERROUS SULFATE 325 (65 FE) MG PO TABS
325.0000 mg | ORAL_TABLET | Freq: Every day | ORAL | Status: DC
  Administered 2015-07-07 – 2015-07-10 (×4): 325 mg via ORAL
  Filled 2015-07-07 (×5): qty 1

## 2015-07-07 MED ORDER — OMEGA-3-ACID ETHYL ESTERS 1 G PO CAPS
1.0000 g | ORAL_CAPSULE | Freq: Every day | ORAL | Status: DC
  Administered 2015-07-07 – 2015-07-10 (×4): 1 g via ORAL
  Filled 2015-07-07 (×4): qty 1

## 2015-07-07 MED ORDER — DIAZEPAM 2 MG PO TABS
1.0000 mg | ORAL_TABLET | Freq: Four times a day (QID) | ORAL | Status: DC | PRN
  Administered 2015-07-08: 1 mg via ORAL
  Filled 2015-07-07: qty 1

## 2015-07-07 MED ORDER — ADULT MULTIVITAMIN W/MINERALS CH
1.0000 | ORAL_TABLET | Freq: Every day | ORAL | Status: DC
  Administered 2015-07-07 – 2015-07-10 (×4): 1 via ORAL
  Filled 2015-07-07 (×4): qty 1

## 2015-07-07 MED ORDER — METHOCARBAMOL 500 MG PO TABS
500.0000 mg | ORAL_TABLET | Freq: Two times a day (BID) | ORAL | Status: DC | PRN
  Administered 2015-07-07: 500 mg via ORAL
  Filled 2015-07-07: qty 1

## 2015-07-07 MED ORDER — PANTOPRAZOLE SODIUM 40 MG PO TBEC
40.0000 mg | DELAYED_RELEASE_TABLET | Freq: Every day | ORAL | Status: DC
  Administered 2015-07-07 – 2015-07-10 (×4): 40 mg via ORAL
  Filled 2015-07-07 (×4): qty 1

## 2015-07-07 MED ORDER — AMLODIPINE BESYLATE 5 MG PO TABS
5.0000 mg | ORAL_TABLET | Freq: Every day | ORAL | Status: DC
  Administered 2015-07-07 – 2015-07-10 (×4): 5 mg via ORAL
  Filled 2015-07-07 (×4): qty 1

## 2015-07-07 MED ORDER — MOMETASONE FURO-FORMOTEROL FUM 200-5 MCG/ACT IN AERO
2.0000 | INHALATION_SPRAY | Freq: Two times a day (BID) | RESPIRATORY_TRACT | Status: DC
  Administered 2015-07-07 – 2015-07-10 (×6): 2 via RESPIRATORY_TRACT
  Filled 2015-07-07: qty 8.8

## 2015-07-07 MED ORDER — SODIUM CHLORIDE 0.9 % IV SOLN: INTRAVENOUS | Status: DC

## 2015-07-07 MED ORDER — CARBIDOPA-LEVODOPA 25-100 MG PO TABS
1.0000 | ORAL_TABLET | Freq: Three times a day (TID) | ORAL | Status: DC
  Administered 2015-07-07 – 2015-07-10 (×9): 1 via ORAL
  Filled 2015-07-07 (×9): qty 1

## 2015-07-07 NOTE — Progress Notes (Addendum)
NURSING PROGRESS NOTE  [name] [id number] Admission Data: 07/07/2015 11:24 AM Attending Provider: Norval Morton, MD [id number] [name], MD Code Status: Full Code   [name] is a 80 y.o. female patient admitted from ED:  -No acute distress noted.  -No complaints of shortness of breath.  -No complaints of chest pain.   Patient states she used to use a walker to get around at home, but has had increased weakness over the past few weeks. Pt states she fell two days ago trying to get to the bathroom. States she lives at home with her husband who has Alzheimer's disease.  Blood pressure 111/47, pulse 64, temperature 98.6 F (37 C), temperature source Oral, resp. rate 18, SpO2 92 %.   IV Fluids:  IV in place, occlusive dsg intact without redness, IV cath hand right, condition patent and no redness,  NS infusing at 75 ml/hr.  Allergies:  Relafen; Lipitor; Other; and Penicillins  Past Medical History:   has a past medical history of History of sudden visual loss; Acute cystitis; Bronchitis, acute; Hypertension; Cerebrovascular disease; Hypothyroidism; GERD (gastroesophageal reflux disease); Diverticulosis of colon; DJD (degenerative joint disease); Back pain; Transient ischemic attack; Anxiety; Depression; Asthma; COPD (chronic obstructive pulmonary disease) ([place]); Shortness of breath; Pneumonia (2015); Stroke [facility]); UTI (lower urinary tract infection); Carotid artery occlusion; Hyperlipidemia; Aortic stenosis, mild (11/18/2013); Pulmonary hypertension, moderate to severe ([place]) (11/19/2014); and Anemia.  Past Surgical History:   has past surgical history that includes Vaginal hysterectomy; sigmoid colectomy; Tubal ligation; Replacement total knee; Cardiothoracic procedure (01/13/2009); right foot surgery ; endartarectomy; Total knee arthroplasty (Left, 11/17/2012); TEE without cardioversion (N/A, 02/08/2014); Cardiac catheterization (02/18/14); and left and right heart catheterization with coronary  angiogram (N/A, 02/18/2014).  Social History:   reports that she has never smoked. She has never used smokeless tobacco. She reports that she does not drink alcohol or use illicit drugs.  Skin: Large, dark purple bruise to right hip, bruising to R elbow, bandaid to left cheek - patient states she had a "horn" removed over a week ago. Minimal excoriation noted between buttocks, will apply barrier cream.  Patient/Family orientated to room. Information packet given to patient/family. Admission inpatient armband information verified with patient/family to include name and date of birth and placed on patient arm. Side rails up x 2, fall assessment and education completed with patient/family. Patient/family able to verbalize understanding of risk associated with falls and verbalized understanding to call for assistance before getting out of bed. Call light within reach. Patient/family able to voice and demonstrate understanding of unit orientation instructions.    Will continue to evaluate and treat per MD orders.     Doristine Devoid, RN

## 2015-07-07 NOTE — ED Notes (Signed)
Regular diet order taken for lunch.

## 2015-07-07 NOTE — ED Notes (Signed)
Attempted report to floor x 1 

## 2015-07-07 NOTE — Telephone Encounter (Signed)
Patient's daughter, [name] has called back in regards.  Gave MD response.  Told daughter to check with Social worker at Hospital to see what their intention is as to sending her to skilled nursing.  Told to follow back up with [place]

## 2015-07-07 NOTE — H&P (Signed)
[facility] History and Physical  [name] [id number] DOB: 11-05-1934 DOA: 07/06/2015  Referring physician: ED PCP: Cathlean Cower, MD   Chief Complaint: Fall  HPI:  This 80 year old female with a past medical history significant for hypothyroidism, degenerative disc disease, hypertension, previous CVA with residual tremor; presents after a fall 2 days ago with inability to ambulate and increased pain. The incident occurred at night while trying to get up to use the bathroom. Patient was noted to have just finished a course of antibiotics for a UTI. Patient likely had taken a Valium that night for symptoms of anxiety as well as also relaxants for neck pain. She was noted to have injured her right elbow and right hip. As patient was having increasing difficulty in walking thereafter they became concerned and it was advised that they bring her to the hospital for further evaluation. Family at bedside and provided additional history states over the last at least 2 months months the patient had increased falls. She has a husband with dementia and back issues with home she lives and has been unable to care for herself.  Question the possibility of the patient being placed in a possible nursing facility.  Family states that patient has a follow-up appointment with Dr. Newman Pies neurosurgery on December for evaluation of ruptured C4-C7 disks. Patient had just received an epidural injection today of that area. Following the procedure patient had nausea, but appears to be now resolved. Family concern that the patient could have a fall that could injure her neck and leave her paralyzed. Multiple other concerns including the possibility of Parkinson's disease due to a tremor on that appears preferred notes definite been present following a CVA. Since that time patient has had an difficulty with balance.  Upon arrival to the emergency department, initial x-rays of the right hip and elbow show no  acute fractures.  Review of Systems  Constitutional: Positive for malaise/fatigue. Negative for chills and weight loss.  HENT: Negative for ear pain and nosebleeds.   Eyes: Negative for photophobia and pain.  Respiratory: Negative for hemoptysis and sputum production.   Cardiovascular: Negative for chest pain and palpitations.  Gastrointestinal: Positive for nausea. Negative for vomiting and abdominal pain.  Genitourinary: Negative for frequency and hematuria.  Musculoskeletal: Positive for joint pain, falls and neck pain.  Skin: Negative for itching and rash.       Bruising of right hip and elbow  Neurological: Positive for dizziness and weakness. Negative for seizures.       Trouble balancing  Psychiatric/Behavioral: Negative for suicidal ideas. The patient is nervous/anxious.       Past Medical History  Diagnosis Date  . History of sudden visual loss   . Acute cystitis   . Bronchitis, acute   . Hypertension   . Cerebrovascular disease   . Hypothyroidism   . GERD (gastroesophageal reflux disease)   . Diverticulosis of colon   . DJD (degenerative joint disease)   . Back pain   . Transient ischemic attack   . Anxiety   . Depression   . Asthma   . COPD (chronic obstructive pulmonary disease) ([place])   . Shortness of breath     with exertion   . Pneumonia 2015    hx of x 2   . 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 UTI (lower urinary tract infection)     hx of   . Carotid artery occlusion   .  Hyperlipidemia   . Aortic stenosis, mild 11/18/2013  . Pulmonary hypertension, moderate to severe ([place]) 11/19/2014  . Anemia      Past Surgical History  Procedure Laterality Date  . Vaginal hysterectomy    . Sigmoid colectomy    . Tubal ligation    . Replacement total knee      right and left knee  . Cardiothoracic procedure  01/13/2009    Right   . Right foot surgery       nerve cut between toes   . Endartarectomy      right carotid endartarectomy - 2010   . Total knee arthroplasty Left 11/17/2012    Procedure: TOTAL KNEE ARTHROPLASTY;  Surgeon: Gearlean Alf, MD;  Location: [facility];  Service: Orthopedics;  Laterality: Left;  . Tee without cardioversion N/A 02/08/2014    Procedure: TRANSESOPHAGEAL ECHOCARDIOGRAM (TEE);  Surgeon: Larey Dresser, MD;  Location: [facility];  Service: Cardiovascular;  Laterality: N/A;  . Cardiac catheterization  02/18/14  . Left and right heart catheterization with coronary angiogram N/A 02/18/2014    Procedure: LEFT AND RIGHT HEART CATHETERIZATION WITH CORONARY ANGIOGRAM;  Surgeon: Larey Dresser, MD;  Location: [facility] CATH LAB;  Service: Cardiovascular;  Laterality: N/A;      Social History:  reports that she has never smoked. She has never used smokeless tobacco. She reports that she does not drink alcohol or use illicit drugs. Where does patient live--home    and with whom if at home? Husband Can patient participate in ADLs? Needs assistance  Allergies  Allergen Reactions  . Relafen [Nabumetone] Diarrhea and Other (See Comments)    GI / Urinary Bleeding  . Lipitor [Atorvastatin] Other (See Comments)    Causes memory loss  . Other Other (See Comments)    "orvail" unknown:  Causes Bloody stool  . Penicillins Hives, Itching, Swelling and Rash    Has patient had a PCN reaction causing immediate rash, facial/tongue/throat swelling, SOB or lightheadedness with hypotension: no Has patient had a PCN reaction causing severe rash involving mucus membranes or skin necrosis: unknown Has patient had a PCN reaction that required hospitalization no Has patient had a PCN reaction occurring within the last 10 years: no If all of the above answers are "NO", then may proceed with Cephalosporin use.     Family History  Problem Relation Age of Onset  . Emphysema Father   . Heart disease Mother   . Hyperlipidemia Mother   . Hypertension Mother   . Coronary artery disease Sister   . Heart disease Sister   .  Hypertension Sister   . Varicose Veins Sister   . AAA (abdominal aortic aneurysm) Sister   . Rheumatic fever Brother     x2  . Heart attack Brother        Prior to Admission medications   Medication Sig Start Date End Date Taking? Authorizing Provider  ADVAIR HFA 230-21 MCG/ACT inhaler Inhale 2 puffs into the lungs 2 (two) times daily. 11/23/14  Yes Biagio Borg, MD  albuterol (PROVENTIL HFA;VENTOLIN HFA) 108 (90 BASE) MCG/ACT inhaler Inhale 2 puffs into the lungs every 6 (six) hours as needed for wheezing or shortness of breath. 11/19/14  Yes Biagio Borg, MD  amLODipine (NORVASC) 5 MG tablet Take 1 tablet (5 mg total) by mouth daily. 05/20/14  Yes Biagio Borg, MD  aspirin EC 81 MG tablet Take 1 tablet (81 mg total) by mouth daily. 01/26/14  Yes Larey Dresser, MD  Cranberry-Vitamin C-Probiotic (AZO CRANBERRY) 250-30 MG TABS Take 1 capsule by mouth daily.   Yes Historical Provider, MD  diazepam (VALIUM) 2 MG tablet 1/2 - 1 tab by mouth twice per day as needed 07/05/15  Yes Biagio Borg, MD  escitalopram (LEXAPRO) 10 MG tablet Take 1 tablet (10 mg total) by mouth daily. 05/20/14  Yes Biagio Borg, MD  ferrous sulfate 325 (65 FE) MG tablet Take 325 mg by mouth daily with breakfast.   Yes Historical Provider, MD  fish oil-omega-3 fatty acids 1000 MG capsule Take 1 g by mouth daily.    Yes Historical Provider, MD  levothyroxine (SYNTHROID, LEVOTHROID) 50 MCG tablet Take 1 tablet (50 mcg total) by mouth daily. 11/19/14  Yes Biagio Borg, MD  methocarbamol (ROBAXIN) 500 MG tablet Take 1 tablet (500 mg total) by mouth 2 (two) times daily as needed for muscle spasms. 06/28/15  Yes Noemi Chapel, MD  Nutritional Supplements (OSTEO ADVANCE) TABS Take 2 tablets by mouth daily.    Yes Historical Provider, MD  omeprazole (PRILOSEC) 20 MG capsule Take 1 capsule (20 mg total) by mouth daily. 08/12/14  Yes Biagio Borg, MD  simvastatin (ZOCOR) 20 MG tablet Take 1 tablet (20 mg total) by mouth at bedtime.  05/20/14  Yes Biagio Borg, MD  tiZANidine (ZANAFLEX) 4 MG tablet Take 4 mg by mouth 3 (three) times daily as needed for muscle spasms.  06/21/15  Yes Historical Provider, MD     Physical Exam: Filed Vitals:   07/07/15 0030 07/07/15 0115 07/07/15 0130 07/07/15 0200  BP: 131/58 131/54 114/56 128/55  Pulse: 65 77 70 73  Temp:      TempSrc:      Resp:      SpO2: 94% 93% 96% 94%     Constitutional: Vital signs reviewed. Patient is a well-developed and well-nourished in no acute distress and cooperative with exam. Alert and oriented x3.  Head: Normocephalic and atraumatic  Ear: TM normal bilaterally  Mouth: no erythema or exudates, MMM  Eyes: PERRL, EOMI, conjunctivae normal, No scleral icterus.  Neck: Supple, Trachea midline normal ROM, No JVD, mass, thyromegaly, or carotid bruit present.  Cardiovascular: RRR, S1 normal, S2 normal, positive systolic ejection murmur, pulses symmetric and intact bilaterally  Pulmonary/Chest: CTAB, no wheezes, rales, or rhonchi  Abdominal: Soft. Non-tender, non-distended, bowel sounds are normal, no masses, organomegaly, or guarding present.  GU: no CVA tenderness Musculoskeletal: No joint deformities, erythema, or stiffness, ROM full. Tenderness to palpation of the right elbow and right thigh and elbow. Ext: no edema and no cyanosis, pulses palpable bilaterally (DP and PT)  Hematology: no cervical, inginal, or axillary adenopathy.  Neurological: A&O x3, Strenght is normal and symmetric bilaterally. Generalized tremor at baseline. Skin: Warm, dry and intact. No rash, cyanosis, or clubbing. Hematoma of the right thigh approximately 6 inches in diameter. Hematoma noted of the right elbow as well.  Psychiatric: Patient anxious. speech and behavior is normal. Judgment and thought content normal. Cognition and memory are normal.      Data Review   Micro Results No results found for this or any previous visit (from the past 240 hour(s)).  Radiology  Reports Dg Elbow Complete Right  07/06/2015  CLINICAL DATA:  Fall 2 days ago, right elbow pain/bruising EXAM: RIGHT ELBOW - COMPLETE 3+ VIEW COMPARISON:  None. FINDINGS: No fracture or dislocation is seen. The joint spaces are preserved. Visualized soft tissues are within normal limits. No displaced elbow joint fat pads  to suggest an elbow joint effusion. IMPRESSION: No fracture or dislocation is seen. Electronically Signed   By: Julian Hy M.D.   On: 07/06/2015 16:51   Dg Hip Unilat  With Pelvis 2-3 Views Right  07/06/2015  CLINICAL DATA:  Fall 2 days ago EXAM: DG HIP (WITH OR WITHOUT PELVIS) 2-3V RIGHT COMPARISON:  None FINDINGS: There is no evidence of hip fracture or dislocation. There is no evidence of arthropathy or other focal bone abnormality. IMPRESSION: 1. No fracture. 2. If there is high clinical suspicion for occult fracture or the patient refuses to weightbear, consider further evaluation with MRI. Although CT is expeditious, evidence is lacking regarding accuracy of CT over plain film radiography. Electronically Signed   By: Kerby Moors M.D.   On: 07/06/2015 16:53     CBC  Recent Labs Lab 07/06/15 2023  WBC 7.0  HGB 11.6*  HCT 34.8*  PLT 225  MCV 98.9  MCH 33.0  MCHC 33.3  RDW 13.1  LYMPHSABS 1.5  MONOABS 0.4  EOSABS 0.1  BASOSABS 0.1    Chemistries   Recent Labs Lab 07/06/15 2023  NA 138  K 4.5  CL 104  CO2 26  GLUCOSE 151*  BUN 21*  CREATININE 1.76*  CALCIUM 9.6  AST 22  ALT 16  ALKPHOS 56  BILITOT 0.5   ------------------------------------------------------------------------------------------------------------------ estimated creatinine clearance is 28 mL/min (by C-G formula based on Cr of 1.76). ------------------------------------------------------------------------------------------------------------------ No results for input(s): HGBA1C in the last 72  hours. ------------------------------------------------------------------------------------------------------------------ No results for input(s): CHOL, HDL, LDLCALC, TRIG, CHOLHDL, LDLDIRECT in the last 72 hours. ------------------------------------------------------------------------------------------------------------------ No results for input(s): TSH, T4TOTAL, T3FREE, THYROIDAB in the last 72 hours.  Invalid input(s): FREET3 ------------------------------------------------------------------------------------------------------------------ No results for input(s): VITAMINB12, FOLATE, FERRITIN, TIBC, IRON, RETICCTPCT in the last 72 hours.  Coagulation profile No results for input(s): INR, PROTIME in the last 168 hours.  No results for input(s): DDIMER in the last 72 hours.  Cardiac Enzymes No results for input(s): CKMB, TROPONINI, MYOGLOBIN in the last 168 hours.  Invalid input(s): CK ------------------------------------------------------------------------------------------------------------------ Invalid input(s): POCBNP   CBG: No results for input(s): GLUCAP in the last 168 hours.     EKG: Ordered   Assessment/Plan Principal Problem: Falls frequently: Family reports increased frequency of falls in the last 2 months. Multiple different causes on differential including polypharmacy with benzos and muscle relaxants for neck pain symptoms versus infection(UA +trace LE) versus neurologic issue versus dehydration versus cardiac(history of valvular heart disease) versus multifactorial. -Patient high fall risk .strict bedrest unless up with assistance -Check EKG -PT to eval and treat -Check CT of brain -Checking vitamin B12 -Social work /care management consults\  Contusion, hip:  X-rays of the right hip show no acute fracture.  -Follow-up MRI pending at this time.  Gait disturbance: Question of patient's balance. Note of urinary incontinence as well. - as seen  above  Cervical radiculopathy: Family reports patient has ruptured C4-C7 and is scheduled for evaluation December 15. However, asked if Dr. Newman Pies can see patient in-house. Patient status post epidural pain injection.  Acute kidney injury on chronic kidney disease:  baseline creatinine somewhere around 1.3, but on admission today creatinine 1.76 with a BUN of 21. Suspect prerenal in nature. -IV fluids of normal saline at 75 mL per hour -f/u bmp   Hypothyroidism -Check TSH -Continue levothyroxine  Anemia: H&H 11.6 and 34.8 respectively. MCV and MCH at the upper limits of normal. Patient with a large hematoma of right thigh -Check a vitamin  B12. Investigating for possible cause patient's balance issues - check cbc in am  Hyperglycemia: Mild elevation to 151. -Continue to monitor  Anxiety/depression -Continued when necessary Valium patient on high fall risk this may likely need to be discontinued -Continue escitalopram  Code Status:   full Family Communication: bedside Disposition Plan: admit   Total time spent 55 minutes.Greater than 50% of this time was spent in counseling, explanation of diagnosis, planning of further management, and coordination of care  [facility] Pager [phone]  If 7PM-7AM, please contact night-coverage [email or web address] Password [email or web address] 07/07/2015, 2:03 AM

## 2015-07-07 NOTE — Progress Notes (Signed)
[facility] PROGRESS NOTE  [name] [id number] DOB: 1935-01-09 DOA: 07/06/2015 PCP: Cathlean Cower, MD  Assessment/Plan: 1. Recurrent falls/functional decline. -[name] presenting with complaints of recurrent falls requiring greater assistance with her activities of daily living. Family members have been concerned with this progressive decline. -I suspect that her falls are multifactorial with Parkinson's disease, dehydration/AKI, physical deconditioning all likely contributors. -Neurology was consulted as she was started on Sinemet therapy today. -Plan to continue IV fluid resuscitation with normal saline, follow-up on a.m. lab work. -Physical therapy was consulted who recommended rehabilitation at skilled nursing facility.  2.  Parkinson's disease. -Patient presenting with recurrent falls, noted to have rhythmic tremors involving upper extremities, cogwheeling, shuffling gait, retropulsion supporting diagnosis of Parkinson's disease. Neurology was consulted who evaluated patient on 07/07/2015. -She was started on Sinemet 25/100 one tablet by mouth 3 times a day -Will need outpatient follow-up  3.  Acute kidney injury. -Initial lab work showing creatinine of 1.76, increased from 1.42 on 06/28/2015. -I think this may be related to decreased by mouth intake in the context offunctional decline/failure to thrive. -She was started on IV fluids -Will repeat labs in a.m.  4.  Hypothyroidism.  -She last had her TSH checked on 01/19/2015 that was within normal limits at 1.48.  -Will continue Synthroid 50 g by mouth daily. Since she presents with functional decline/failure to thrive will repeat TSH at this time.   5.  History of CVA. -Continue aspirin and statin therapy  Code Status: for code  Family Communication: I spoke to son, daughter-in-law and husband were present at bedside  Disposition Plan: Anticipate patient will require rehabilitation at skilled nursing  facility   Consultants:  Neurology   HPI/Subjective: [name] is a pleasant 80 year old female with a past medical history of CVA 6 years ago, degenerative disc disease, hypertension, presented overnight to the emergency department with complaints of recurrent falls. He remembers also noting progressive functional decline requiring greater assistance with her activities of daily living. Initial workup included a CT scan of brain that did not reveal acute intracranial abnormalities. Labs did reveal the presence of acute on chronic renal insufficiency having a creatinine of 1.76, increased from 1.42 on 06/28/2015. On physical examination she had rhythmic tremors associated with cogwheeling and shuffling gait. There was clinical suspicion for Parkinson's disease for which neurology was consulted. She was started on Sinemet.   Objective: Filed Vitals:   07/07/15 1133 07/07/15 1347  BP: 114/85 112/82  Pulse: 71 72  Temp: 98 F (36.7 C) 98.6 F (37 C)  Resp: 19 18    Intake/Output Summary (Last 24 hours) at 07/07/15 1625 Last data filed at 07/07/15 1530  Gross per 24 hour  Intake    120 ml  Output    550 ml  Net   -430 ml   Filed Weights   07/07/15 1133  Weight: 77.973 kg (171 lb 14.4 oz)    Exam:   General:  Flat affect, mask facies, no acute distress. Having bilateral tremors   Cardiovascular: regular rate and rhythm normal S1-S2 no murmurs rubs or gallops   Respiratory: normal respiratory effort   Abdomen: Soft nontender nondistended   Musculoskeletal:  no edema   Neurological: There were rhythmic tremors involving bilateral upper extremities. She had cogwheeling and mild rigidity associated to upper extremities. She was ambulated, having a shuffling gait. When asked to step back retropulsion was evident.  Data Reviewed: Basic Metabolic Panel:  Recent Labs Lab 07/06/15 2023  NA  138  K 4.5  CL 104  CO2 26  GLUCOSE 151*  BUN 21*  CREATININE 1.76*  CALCIUM  9.6   Liver Function Tests:  Recent Labs Lab 07/06/15 2023  AST 22  ALT 16  ALKPHOS 56  BILITOT 0.5  PROT 7.1  ALBUMIN 3.9   No results for input(s): LIPASE, AMYLASE in the last 168 hours. No results for input(s): AMMONIA in the last 168 hours. CBC:  Recent Labs Lab 07/06/15 2023  WBC 7.0  NEUTROABS 5.0  HGB 11.6*  HCT 34.8*  MCV 98.9  PLT 225   Cardiac Enzymes: No results for input(s): CKTOTAL, CKMB, CKMBINDEX, TROPONINI in the last 168 hours. BNP (last 3 results) No results for input(s): BNP in the last 8760 hours.  ProBNP (last 3 results)  Recent Labs  12/06/14 1421  PROBNP 61.0    CBG: No results for input(s): GLUCAP in the last 168 hours.  Recent Results (from the past 240 hour(s))  Urine culture     Status: None   Collection Time: 07/05/15  5:24 PM  Result Value Ref Range Status   Colony Count NO GROWTH  Final   Organism ID, Bacteria NO GROWTH  Final     Studies: Dg Elbow Complete Right  07/06/2015  CLINICAL DATA:  Fall 2 days ago, right elbow pain/bruising EXAM: RIGHT ELBOW - COMPLETE 3+ VIEW COMPARISON:  None. FINDINGS: No fracture or dislocation is seen. The joint spaces are preserved. Visualized soft tissues are within normal limits. No displaced elbow joint fat pads to suggest an elbow joint effusion. IMPRESSION: No fracture or dislocation is seen. Electronically Signed   By: Julian Hy M.D.   On: 07/06/2015 16:51   Ct Head Wo Contrast  07/07/2015  CLINICAL DATA:  80 year old hypertensive female with frequent falls. Initial encounter. EXAM: CT HEAD WITHOUT CONTRAST TECHNIQUE: Contiguous axial images were obtained from the base of the skull through the vertex without intravenous contrast. COMPARISON:  01/27/2013 brain MR. 01/02/2013 head CT. FINDINGS: No skull fracture or intracranial hemorrhage. Small vessel disease type changes without CT evidence of large acute infarct. Mild global atrophy without hydrocephalus. No intracranial mass lesion  noted on this unenhanced exam. Orbital structures unremarkable. Visualized paranasal sinuses, mastoid air cells and middle ear cavities are clear. Vascular calcifications. IMPRESSION: No skull fracture or intracranial hemorrhage. Small vessel disease type changes without CT evidence of large acute infarct. Electronically Signed   By: Genia Del M.D.   On: 07/07/2015 07:19   Mr Hip Right Wo Contrast  07/07/2015  CLINICAL DATA:  Fall on 07/06/2015 with bruising of the right hip and right hip pain. EXAM: MR OF THE RIGHT HIP WITHOUT CONTRAST TECHNIQUE: Multiplanar, multisequence MR imaging was performed. No intravenous contrast was administered. COMPARISON:  07/06/2015 radiographs FINDINGS: Bones: Degenerative endplate findings in the lower lumbar spine. No fracture observed. No significant abnormal osseous edema in the hips or bony pelvis. Articular cartilage and labrum Articular cartilage: Limited assessment due to motion artifact, but not notably thinned. Labrum: Limited assessment due to motion artifact. No obvious tear. Joint or bursal effusion Joint effusion: Upper normal amount of fluid in both hip joints without overt effusion. Bursae:  No regional bursitis Muscles and tendons Muscles and tendons:  Unremarkable Other findings Miscellaneous:  Uterus absent. IMPRESSION: 1. No significant acute findings to explain the patient's right hip pain. 2. Lower lumbar spondylosis and degenerative disc disease. 3. No significant change from the on-call preliminary report. Electronically Signed   By: Thayer Jew  Janeece Fitting M.D.   On: 07/07/2015 07:36   Dg Hip Unilat  With Pelvis 2-3 Views Right  07/06/2015  CLINICAL DATA:  Fall 2 days ago EXAM: DG HIP (WITH OR WITHOUT PELVIS) 2-3V RIGHT COMPARISON:  None FINDINGS: There is no evidence of hip fracture or dislocation. There is no evidence of arthropathy or other focal bone abnormality. IMPRESSION: 1. No fracture. 2. If there is high clinical suspicion for occult fracture  or the patient refuses to weightbear, consider further evaluation with MRI. Although CT is expeditious, evidence is lacking regarding accuracy of CT over plain film radiography. Electronically Signed   By: Kerby Moors M.D.   On: 07/06/2015 16:53    Scheduled Meds: . amLODipine  5 mg Oral Daily  . aspirin EC  81 mg Oral Daily  . carbidopa-levodopa  1 tablet Oral TID  . enoxaparin (LOVENOX) injection  30 mg Subcutaneous Q24H  . escitalopram  10 mg Oral Daily  . ferrous sulfate  325 mg Oral Q breakfast  . levothyroxine  50 mcg Oral QAC breakfast  . mometasone-formoterol  2 puff Inhalation BID  . multivitamin with minerals  1 tablet Oral Daily  . omega-3 acid ethyl esters  1 g Oral Daily  . pantoprazole  40 mg Oral Daily  . senna-docusate  1 tablet Oral QHS  . simvastatin  20 mg Oral QHS   Continuous Infusions: . sodium chloride    . sodium chloride 1,000 mL (07/07/15 0226)    Principal Problem:   Falls frequently Active Problems:   Hypothyroidism   Contusion, hip   Cervical radiculopathy   Anxiety and depression   Gait disturbance   Hyperglycemia   Time spent: 35 min   Kelvin Cellar  [facility] Pager [phone]. If 7PM-7AM, please contact night-coverage at [email or web address], password [email or web address] 07/07/2015, 4:25 PM

## 2015-07-07 NOTE — ED Notes (Signed)
Patient transported to CT 

## 2015-07-07 NOTE — ED Notes (Signed)
Pt assisted to bedside commode w/ 2 assist. No difficulties.

## 2015-07-07 NOTE — ED Notes (Signed)
Breakfast delivered  

## 2015-07-07 NOTE — Consult Note (Signed)
NEURO HOSPITALIST CONSULT NOTE   Requestig physician: Dr. Coralyn Pear   Reason for Consult: Parkinson's disease  HPI:                                                                                                                                          [name] is an 80 y.o. female who was brought to hospital for recurrent falls. Per family, she has been shuffling her feet, stooped posture, noted to freeze before initiating her initial step, falling backward when sitting and admits to drooling. She had been seen by her primary MD who did not believe she had PD but due to the falls family wanted second opinion for parkinson's.   Past Medical History  Diagnosis Date  . History of sudden visual loss   . Acute cystitis   . Bronchitis, acute   . Hypertension   . Cerebrovascular disease   . Hypothyroidism   . GERD (gastroesophageal reflux disease)   . Diverticulosis of colon   . DJD (degenerative joint disease)   . Back pain   . Transient ischemic attack   . Anxiety   . Depression   . Asthma   . COPD (chronic obstructive pulmonary disease) ([place])   . Shortness of breath     with exertion   . Pneumonia 2015    hx of x 2   . 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 UTI (lower urinary tract infection)     hx of   . Carotid artery occlusion   . Hyperlipidemia   . Aortic stenosis, mild 11/18/2013  . Pulmonary hypertension, moderate to severe ([place]) 11/19/2014  . Anemia   . Fall 07/05/2015    Past Surgical History  Procedure Laterality Date  . Vaginal hysterectomy    . Sigmoid colectomy    . Tubal ligation    . Replacement total knee      right and left knee  . Cardiothoracic procedure  01/13/2009    Right   . Right foot surgery       nerve cut between toes   . Endartarectomy      right carotid endartarectomy - 2010  . Total knee arthroplasty Left 11/17/2012    Procedure: TOTAL KNEE ARTHROPLASTY;  Surgeon: Gearlean Alf, MD;  Location: [facility]  ORS;  Service: Orthopedics;  Laterality: Left;  . Tee without cardioversion N/A 02/08/2014    Procedure: TRANSESOPHAGEAL ECHOCARDIOGRAM (TEE);  Surgeon: Larey Dresser, MD;  Location: [facility];  Service: Cardiovascular;  Laterality: N/A;  . Cardiac catheterization  02/18/14  . Left and right heart catheterization with coronary angiogram N/A 02/18/2014    Procedure: LEFT AND RIGHT HEART CATHETERIZATION WITH CORONARY ANGIOGRAM;  Surgeon: Larey Dresser, MD;  Location: [facility] CATH LAB;  Service: Cardiovascular;  Laterality: N/A;    Family History  Problem Relation Age of Onset  . Emphysema Father   . Heart disease Mother   . Hyperlipidemia Mother   . Hypertension Mother   . Coronary artery disease Sister   . Heart disease Sister   . Hypertension Sister   . Varicose Veins Sister   . AAA (abdominal aortic aneurysm) Sister   . Rheumatic fever Brother     x2  . Heart attack Brother      Social History:  reports that she has never smoked. She has never used smokeless tobacco. She reports that she does not drink alcohol or use illicit drugs.  Allergies  Allergen Reactions  . Relafen [Nabumetone] Diarrhea and Other (See Comments)    GI / Urinary Bleeding  . Lipitor [Atorvastatin] Other (See Comments)    Causes memory loss  . Other Other (See Comments)    "orvail" unknown:  Causes Bloody stool  . Penicillins Hives, Itching, Swelling and Rash    Has patient had a PCN reaction causing immediate rash, facial/tongue/throat swelling, SOB or lightheadedness with hypotension: no Has patient had a PCN reaction causing severe rash involving mucus membranes or skin necrosis: unknown Has patient had a PCN reaction that required hospitalization no Has patient had a PCN reaction occurring within the last 10 years: no If all of the above answers are "NO", then may proceed with Cephalosporin use.     MEDICATIONS:                                                                                                                      Prior to Admission:  Prescriptions prior to admission  Medication Sig Dispense Refill Last Dose  . ADVAIR HFA 230-21 MCG/ACT inhaler Inhale 2 puffs into the lungs 2 (two) times daily. 1 Inhaler 3 07/06/2015 at Unknown time  . albuterol (PROVENTIL HFA;VENTOLIN HFA) 108 (90 BASE) MCG/ACT inhaler Inhale 2 puffs into the lungs every 6 (six) hours as needed for wheezing or shortness of breath. 3 Inhaler 3 Past Month at Unknown time  . amLODipine (NORVASC) 5 MG tablet Take 1 tablet (5 mg total) by mouth daily. 90 tablet 3 07/06/2015 at Unknown time  . aspirin EC 81 MG tablet Take 1 tablet (81 mg total) by mouth daily. 90 tablet 3 07/06/2015 at Unknown time  . Cranberry-Vitamin C-Probiotic (AZO CRANBERRY) 250-30 MG TABS Take 1 capsule by mouth daily.   07/06/2015 at Unknown time  . diazepam (VALIUM) 2 MG tablet 1/2 - 1 tab by mouth twice per day as needed 60 tablet 0 Past Month at Unknown time  . escitalopram (LEXAPRO) 10 MG tablet Take 1 tablet (10 mg total) by mouth daily. 90 tablet 3 07/06/2015 at Unknown time  . ferrous sulfate 325 (65 FE) MG tablet Take 325 mg by mouth daily with breakfast.   07/05/2015 at Unknown time  . fish oil-omega-3 fatty acids 1000 MG capsule Take 1 g  by mouth daily.    Past Week at Unknown time  . levothyroxine (SYNTHROID, LEVOTHROID) 50 MCG tablet Take 1 tablet (50 mcg total) by mouth daily. 90 tablet 3 07/06/2015 at Unknown time  . methocarbamol (ROBAXIN) 500 MG tablet Take 1 tablet (500 mg total) by mouth 2 (two) times daily as needed for muscle spasms. 10 tablet 0 Past Month at Unknown time  . Nutritional Supplements (OSTEO ADVANCE) TABS Take 2 tablets by mouth daily.    07/06/2015 at Unknown time  . omeprazole (PRILOSEC) 20 MG capsule Take 1 capsule (20 mg total) by mouth daily. 90 capsule 3 07/06/2015 at Unknown time  . simvastatin (ZOCOR) 20 MG tablet Take 1 tablet (20 mg total) by mouth at bedtime. 90 tablet 3 07/05/2015 at Unknown time  . tiZANidine  (ZANAFLEX) 4 MG tablet Take 4 mg by mouth 3 (three) times daily as needed for muscle spasms.   0 Past Month at Unknown time   Scheduled: . amLODipine  5 mg Oral Daily  . aspirin EC  81 mg Oral Daily  . enoxaparin (LOVENOX) injection  30 mg Subcutaneous Q24H  . escitalopram  10 mg Oral Daily  . ferrous sulfate  325 mg Oral Q breakfast  . levothyroxine  50 mcg Oral QAC breakfast  . mometasone-formoterol  2 puff Inhalation BID  . multivitamin with minerals  1 tablet Oral Daily  . omega-3 acid ethyl esters  1 g Oral Daily  . pantoprazole  40 mg Oral Daily  . senna-docusate  1 tablet Oral QHS  . simvastatin  20 mg Oral QHS     ROS:                                                                                                                                       History obtained from the patient and family  General ROS: negative for - chills, fatigue, fever, night sweats, weight gain or weight loss Psychological ROS: negative for - behavioral disorder, hallucinations, memory difficulties, mood swings or suicidal ideation Ophthalmic ROS: negative for - blurry vision, double vision, eye pain or loss of vision ENT ROS: negative for - epistaxis, nasal discharge, oral lesions, sore throat, tinnitus or vertigo Allergy and Immunology ROS: negative for - hives or itchy/watery eyes Hematological and Lymphatic ROS: negative for - bleeding problems, bruising or swollen lymph nodes Endocrine ROS: negative for - galactorrhea, hair pattern changes, polydipsia/polyuria or temperature intolerance Respiratory ROS: negative for - cough, hemoptysis, shortness of breath or wheezing Cardiovascular ROS: negative for - chest pain, dyspnea on exertion, edema or irregular heartbeat Gastrointestinal ROS: negative for - abdominal pain, diarrhea, hematemesis, nausea/vomiting or stool incontinence Genito-Urinary ROS: negative for - dysuria, hematuria, incontinence or urinary frequency/urgency Musculoskeletal ROS:  negative for - joint swelling or muscular weakness Neurological ROS: as noted in HPI Dermatological ROS: negative for rash and skin lesion changes   Blood pressure 112/82, pulse  72, temperature 98.6 F (37 C), temperature source Oral, resp. rate 18, height 5\' 5"  (1.651 m), weight 77.973 kg (171 lb 14.4 oz), SpO2 98 %.   Neurologic Examination:                                                                                                      HEENT-  Normocephalic, no lesions, without obvious abnormality.  Normal external eye and conjunctiva.  Normal TM's bilaterally.  Normal auditory canals and external ears. Normal external nose, mucus membranes and septum.  Normal pharynx. Cardiovascular- S1, S2 normal, pulses palpable throughout   Lungs- chest clear, no wheezing, rales, normal symmetric air entry Abdomen- normal findings: bowel sounds normal Extremities- no edema Lymph-no adenopathy palpable Musculoskeletal-no joint tenderness, deformity or swelling Skin-warm and dry, no hyperpigmentation, vitiligo, or suspicious lesions    Neurological Examination Mental Status: Alert, oriented, thought content appropriate.  Speech fluent without evidence of aphasia.  Able to follow 3 step commands without difficulty. Cranial Nerves: II: Discs flat bilaterally; Visual fields grossly normal, pupils equal, round, reactive to light and accommodation III,IV, VI: ptosis not present, extra-ocular motions intact bilaterally V,VII: smile symmetric, masked facial expression, light touch sensation normal bilaterally VIII: hearing normal bilaterally IX,X: uvula rises symmetrically XI: bilateral shoulder shrug XII: midline tongue extension Motor: Right : Upper extremity   5/5    Left:     Upper extremity   5/5  Lower extremity   5/5     Lower extremity   5/5 --postural and action tremor. Postural tremor noted in bilateral hands and right leg.  --cogwheel rigidity  Tone and bulk:normal tone throughout; no  atrophy noted Sensory: Pinprick and light touch intact throughout, bilaterally Deep Tendon Reflexes: 2+ and symmetric throughout with clonus in right ankle.  Plantars: Right: upgoing   Left: downgoing Cerebellar: normal finger-to-nose Gait: not tested      Lab Results: Basic Metabolic Panel:  Recent Labs Lab 07/06/15 2023  NA 138  K 4.5  CL 104  CO2 26  GLUCOSE 151*  BUN 21*  CREATININE 1.76*  CALCIUM 9.6    Liver Function Tests:  Recent Labs Lab 07/06/15 2023  AST 22  ALT 16  ALKPHOS 56  BILITOT 0.5  PROT 7.1  ALBUMIN 3.9   No results for input(s): LIPASE, AMYLASE in the last 168 hours. No results for input(s): AMMONIA in the last 168 hours.  CBC:  Recent Labs Lab 07/06/15 2023  WBC 7.0  NEUTROABS 5.0  HGB 11.6*  HCT 34.8*  MCV 98.9  PLT 225    Cardiac Enzymes: No results for input(s): CKTOTAL, CKMB, CKMBINDEX, TROPONINI in the last 168 hours.  Lipid Panel: No results for input(s): CHOL, TRIG, HDL, CHOLHDL, VLDL, LDLCALC in the last 168 hours.  CBG: No results for input(s): GLUCAP in the last 168 hours.  Microbiology: Results for orders placed or performed in visit on 07/05/15  Urine culture     Status: None   Collection Time: 07/05/15  5:24 PM  Result Value Ref Range Status   Colony Count NO GROWTH  Final   Organism ID, Bacteria NO GROWTH  Final    Coagulation Studies: No results for input(s): LABPROT, INR in the last 72 hours.  Imaging: Dg Elbow Complete Right  07/06/2015  CLINICAL DATA:  Fall 2 days ago, right elbow pain/bruising EXAM: RIGHT ELBOW - COMPLETE 3+ VIEW COMPARISON:  None. FINDINGS: No fracture or dislocation is seen. The joint spaces are preserved. Visualized soft tissues are within normal limits. No displaced elbow joint fat pads to suggest an elbow joint effusion. IMPRESSION: No fracture or dislocation is seen. Electronically Signed   By: Julian Hy M.D.   On: 07/06/2015 16:51   Ct Head Wo Contrast  07/07/2015   CLINICAL DATA:  80 year old hypertensive female with frequent falls. Initial encounter. EXAM: CT HEAD WITHOUT CONTRAST TECHNIQUE: Contiguous axial images were obtained from the base of the skull through the vertex without intravenous contrast. COMPARISON:  01/27/2013 brain MR. 01/02/2013 head CT. FINDINGS: No skull fracture or intracranial hemorrhage. Small vessel disease type changes without CT evidence of large acute infarct. Mild global atrophy without hydrocephalus. No intracranial mass lesion noted on this unenhanced exam. Orbital structures unremarkable. Visualized paranasal sinuses, mastoid air cells and middle ear cavities are clear. Vascular calcifications. IMPRESSION: No skull fracture or intracranial hemorrhage. Small vessel disease type changes without CT evidence of large acute infarct. Electronically Signed   By: Genia Del M.D.   On: 07/07/2015 07:19   Mr Hip Right Wo Contrast  07/07/2015  CLINICAL DATA:  Fall on 07/06/2015 with bruising of the right hip and right hip pain. EXAM: MR OF THE RIGHT HIP WITHOUT CONTRAST TECHNIQUE: Multiplanar, multisequence MR imaging was performed. No intravenous contrast was administered. COMPARISON:  07/06/2015 radiographs FINDINGS: Bones: Degenerative endplate findings in the lower lumbar spine. No fracture observed. No significant abnormal osseous edema in the hips or bony pelvis. Articular cartilage and labrum Articular cartilage: Limited assessment due to motion artifact, but not notably thinned. Labrum: Limited assessment due to motion artifact. No obvious tear. Joint or bursal effusion Joint effusion: Upper normal amount of fluid in both hip joints without overt effusion. Bursae:  No regional bursitis Muscles and tendons Muscles and tendons:  Unremarkable Other findings Miscellaneous:  Uterus absent. IMPRESSION: 1. No significant acute findings to explain the patient's right hip pain. 2. Lower lumbar spondylosis and degenerative disc disease. 3. No  significant change from the on-call preliminary report. Electronically Signed   By: Van Clines M.D.   On: 07/07/2015 07:36   Dg Hip Unilat  With Pelvis 2-3 Views Right  07/06/2015  CLINICAL DATA:  Fall 2 days ago EXAM: DG HIP (WITH OR WITHOUT PELVIS) 2-3V RIGHT COMPARISON:  None FINDINGS: There is no evidence of hip fracture or dislocation. There is no evidence of arthropathy or other focal bone abnormality. IMPRESSION: 1. No fracture. 2. If there is high clinical suspicion for occult fracture or the patient refuses to weightbear, consider further evaluation with MRI. Although CT is expeditious, evidence is lacking regarding accuracy of CT over plain film radiography. Electronically Signed   By: Kerby Moors M.D.   On: 07/06/2015 16:53       Assessment and plan per attending neurologist  Etta Quill PA-C [facility] [phone]  07/07/2015, 3:02 PM   Assessment/Plan: 80 YO  Female with symptoms of parkinson's disease. I would favo rseinemet trial and foloow up with outpatient neurologist. Response may not be immediate, so would give it several days at this dose. The clonus in the leg could be due  to cervical spine disease, but the hand tremor appears to be pill-rolling.   1) Sinemet 25/100mg  TID 2) We have requested follow up with Dr. Carles Collet with [facility].  3) Please call with any further questions or concerns.   Roland Rack, MD [facility] [phone]  If 7pm- 7am, please page neurology on call as listed in [place].

## 2015-07-07 NOTE — Evaluation (Signed)
Physical Therapy Evaluation Patient Details Name: [name] MRN: [id number] DOB: Dec 05, 1934 Today's Date: 07/07/2015   History of Present Illness  This 80 year old female with a past medical history significant for hypothyroidism, degenerative disc disease, hypertension, previous CVA with residual tremor; presents after a fall 2 days ago with inability to ambulate and increased pain. The incident occurred at night while trying to get up to use the bathroom. Patient was noted to have just finished a course of antibiotics for a UTI. Patient likely had taken a Valium that night for symptoms of anxiety as well as also relaxants for neck pain. She was noted to have injured her right elbow and right hip. As patient was having increasing difficulty in walking thereafter they became concerned and it was advised that they bring her to the hospital for further evaluation. Family at bedside and provided additional history states over the last at least 2 months months the patient had increased falls. She has a husband with dementia and back issues with home she lives and has been unable to care for herself. Question the possibility of the patient being placed in a possible nursing facility.  Clinical Impression  Pt admitted with above diagnosis. Pt currently with functional limitations due to the deficits listed below (see PT Problem List).  Pt will benefit from skilled PT to increase their independence and safety with mobility to allow discharge to the venue listed below.       Follow Up Recommendations SNF    Equipment Recommendations  None recommended by PT    Recommendations for Other Services       Precautions / Restrictions Precautions Precautions: Fall Restrictions Weight Bearing Restrictions: No      Mobility  Bed Mobility Overal bed mobility: Needs Assistance Bed Mobility: Supine to Sit     Supine to sit: Mod assist;HOB elevated     General bed mobility comments: HOB elevated  with use of rail; mod cues for sequencing  Transfers Overall transfer level: Needs assistance Equipment used: Rolling walker (2 wheeled) Transfers: Sit to/from Stand Sit to Stand: Mod assist         General transfer comment: mod A for sit to/from stand with cues for ant weight shift  Ambulation/Gait Ambulation/Gait assistance: Min assist Ambulation Distance (Feet): 3 Feet Assistive device: Rolling walker (2 wheeled) Gait Pattern/deviations: Step-to pattern     General Gait Details: pivitol steps to chair only  Stairs            Wheelchair Mobility    Modified Rankin (Stroke Patients Only)       Balance Overall balance assessment: Needs assistance;History of Falls Sitting-balance support: Feet supported;Single extremity supported Sitting balance-Leahy Scale: Fair     Standing balance support: During functional activity;Bilateral upper extremity supported Standing balance-Leahy Scale: Poor                               Pertinent Vitals/Pain Pain Assessment: 0-10 Pain Score: 0-No pain    Home Living Family/patient expects to be discharged to:: Unsure Living Arrangements: Spouse/significant other                    Prior Function Level of Independence: Independent with assistive device(s)         Comments: using 4 wheeled RW     Hand Dominance        Extremity/Trunk Assessment  Lower Extremity Assessment: Generalized weakness         Communication   Communication: No difficulties  Cognition Arousal/Alertness: Awake/alert Behavior During Therapy: WFL for tasks assessed/performed Overall Cognitive Status: Within Functional Limits for tasks assessed                      General Comments General comments (skin integrity, edema, etc.): Pt c/o lightheadedness upon sitting resolving quickly.  Pt able to transfer to chair and reports lightheadedness resolved after sitting in chair.  RN notified.      Exercises        Assessment/Plan    PT Assessment Patient needs continued PT services  PT Diagnosis Difficulty walking;Abnormality of gait;Generalized weakness   PT Problem List Decreased strength;Decreased activity tolerance;Decreased balance;Decreased mobility;Decreased knowledge of use of DME;Decreased safety awareness;Decreased knowledge of precautions  PT Treatment Interventions DME instruction;Gait training;Functional mobility training;Therapeutic activities;Therapeutic exercise;Balance training;Patient/family education   PT Goals (Current goals can be found in the Care Plan section) Acute Rehab PT Goals Patient Stated Goal: to get stronger; wants to go to SNF for rehab PT Goal Formulation: With patient/family Time For Goal Achievement: 07/14/15 Potential to Achieve Goals: Good    Frequency Min 3X/week   Barriers to discharge Decreased caregiver support husband with dementia and family unable to provide necessary level of care    Co-evaluation               End of Session Equipment Utilized During Treatment: Gait belt Activity Tolerance: Patient tolerated treatment well;Treatment limited secondary to medical complications (Comment) Patient left: in chair;with call bell/phone within reach;with chair alarm set;with nursing/sitter in room;with family/visitor present Nurse Communication: Mobility status    Functional Assessment Tool Used: clinical judgement Functional Limitation: Mobility: Walking and moving around Mobility: Walking and Moving Around Current Status [phone]): At least 60 percent but less than 80 percent impaired, limited or restricted Mobility: Walking and Moving Around Goal Status [phone]): At least 40 percent but less than 60 percent impaired, limited or restricted    Time: 1211-1235 PT Time Calculation (min) (ACUTE ONLY): 24 min   Charges:   PT Evaluation $Initial PT Evaluation Tier I: 1 Procedure PT Treatments $Therapeutic Activity: 8-22  mins   PT G Codes:   PT G-Codes **NOT FOR INPATIENT CLASS** Functional Assessment Tool Used: clinical judgement Functional Limitation: Mobility: Walking and moving around Mobility: Walking and Moving Around Current Status [id number]): At least 60 percent but less than 80 percent impaired, limited or restricted Mobility: Walking and Moving Around Goal Status [phone]): At least 40 percent but less than 60 percent impaired, limited or restricted   Laureen Abrahams, PT, DPT 07/07/2015 1:47 PM [phone]

## 2015-07-08 ENCOUNTER — Encounter: Payer: Self-pay | Admitting: Internal Medicine

## 2015-07-08 DIAGNOSIS — R627 Adult failure to thrive: Secondary | ICD-10-CM

## 2015-07-08 DIAGNOSIS — R296 Repeated falls: Secondary | ICD-10-CM | POA: Diagnosis not present

## 2015-07-08 DIAGNOSIS — G20 Parkinson's disease: Secondary | ICD-10-CM | POA: Diagnosis not present

## 2015-07-08 LAB — BASIC METABOLIC PANEL
Anion gap: 8 (ref 5–15)
BUN: 15 mg/dL (ref 6–20)
CALCIUM: 9.4 mg/dL (ref 8.9–10.3)
CO2: 26 mmol/L (ref 22–32)
CREATININE: 1.42 mg/dL — AB (ref 0.44–1.00)
Chloride: 108 mmol/L (ref 101–111)
GFR calc Af Amer: 40 mL/min — ABNORMAL LOW (ref >60)
GFR, EST NON AFRICAN AMERICAN: 35 mL/min — AB (ref >60)
GLUCOSE: 94 mg/dL (ref 65–99)
POTASSIUM: 4.4 mmol/L (ref 3.5–5.1)
SODIUM: 142 mmol/L (ref 135–145)

## 2015-07-08 LAB — CBC
HEMATOCRIT: 31.9 % — AB (ref 36.0–46.0)
Hemoglobin: 10.5 g/dL — ABNORMAL LOW (ref 12.0–15.0)
MCH: 33.1 pg (ref 26.0–34.0)
MCHC: 32.9 g/dL (ref 30.0–36.0)
MCV: 100.6 fL — AB (ref 78.0–100.0)
Platelets: 217 10*3/uL (ref 150–400)
RBC: 3.17 MIL/uL — ABNORMAL LOW (ref 3.87–5.11)
RDW: 13.2 % (ref 11.5–15.5)
WBC: 5.4 10*3/uL (ref 4.0–10.5)

## 2015-07-08 LAB — TSH: TSH: 0.628 u[IU]/mL (ref 0.350–4.500)

## 2015-07-08 MED ORDER — ENOXAPARIN SODIUM 40 MG/0.4ML ~~LOC~~ SOLN
40.0000 mg | SUBCUTANEOUS | Status: DC
  Administered 2015-07-09 – 2015-07-10 (×2): 40 mg via SUBCUTANEOUS
  Filled 2015-07-08 (×2): qty 0.4

## 2015-07-08 MED ORDER — PRAVASTATIN SODIUM 20 MG PO TABS
20.0000 mg | ORAL_TABLET | Freq: Every day | ORAL | Status: DC
  Administered 2015-07-08 – 2015-07-09 (×2): 20 mg via ORAL
  Filled 2015-07-08 (×2): qty 1

## 2015-07-08 NOTE — Care Management Obs Status (Signed)
[facility] NOTIFICATION   Patient Details  Name: [name] MRN: [id number] Date of Birth: [birth year]   Medicare Observation Status Notification Given:  Yes Cm spoke to patient at the bedside and explained observation letter. Patient verbalized understanding but states that she is tired and can't sign because of her tremors. CM called and spoke with Jeanella Anton. CM explained the observation letter to the son and placed a copy at the bedside and the original in the chart. Son said that he would take another look over it when he comes in the morning. CM reinforced that patient was still being considered observation status. Understanding verbalized.    Guido Sander, RN 07/08/2015, 9:43 PM

## 2015-07-08 NOTE — Progress Notes (Signed)
[facility] PROGRESS NOTE  Zeya Vicenti [id number] DOB: Mar 06, 1935 DOA: 07/06/2015 PCP: Cathlean Cower, MD  Assessment/Plan: 1. Recurrent falls/functional decline. -Mrs Bushaw presenting with complaints of recurrent falls requiring greater assistance with her activities of daily living. Family members have been concerned with this progressive decline. -I suspect that her falls are multifactorial with Parkinson's disease, dehydration/AKI, physical deconditioning all likely contributors. -Neurology was consulted as she was started on Sinemet therapy on 07/08/2015. -Patient seems improved today after starting Sinemet therapy. She was able to ambulate 150 ft down the hallway.   2.  Parkinson's disease. -Patient presenting with recurrent falls, noted to have rhythmic tremors involving upper extremities, cogwheeling, shuffling gait, retropulsion supporting diagnosis of Parkinson's disease. Neurology was consulted who evaluated patient on 07/07/2015. -She was started on Sinemet 25/100 one tablet by mouth 3 times a day -Ambulating better today  3.  Acute kidney injury. -Initial lab work showing creatinine of 1.76, increased from 1.42 on 06/28/2015. -I think this may be related to decreased by mouth intake in the context offunctional decline/failure to thrive. -She was started on IV fluids -Repeat labs showing Cr of 1.42, down from 1.76 -Plan for 1 more day of gentle IV fluid hydration  4.  Hypothyroidism.  -She last had her TSH checked on 01/19/2015 that was within normal limits at 1.48.  -Will continue Synthroid 50 g by mouth daily. -TSH within normal limits at 0.628  5.  History of CVA. -Continue aspirin and statin therapy  Code Status: for code  Family Communication: I spoke to son, daughter-in-law and husband were present at bedside  Disposition Plan: Informed by CM pt does not qualify for inpatient status. Will likley be discharged home with home health services in the next 24  hours.   Consultants:  Neurology   HPI/Subjective: Mrs Morgans is a pleasant 80 year old female with a past medical history of CVA 6 years ago, degenerative disc disease, hypertension, presented overnight to the emergency department with complaints of recurrent falls. He remembers also noting progressive functional decline requiring greater assistance with her activities of daily living. Initial workup included a CT scan of brain that did not reveal acute intracranial abnormalities. Labs did reveal the presence of acute on chronic renal insufficiency having a creatinine of 1.76, increased from 1.42 on 06/28/2015. On physical examination she had rhythmic tremors associated with cogwheeling and shuffling gait. There was clinical suspicion for Parkinson's disease for which neurology was consulted. She was started on Sinemet.   Objective: Filed Vitals:   07/08/15 0540 07/08/15 1342  BP: 131/51 122/61  Pulse: 64 76  Temp: 97.9 F (36.6 C) 98 F (36.7 C)  Resp: 16 20    Intake/Output Summary (Last 24 hours) at 07/08/15 1513 Last data filed at 07/08/15 1300  Gross per 24 hour  Intake   1910 ml  Output    850 ml  Net   1060 ml   Filed Weights   07/07/15 1133  Weight: 77.973 kg (171 lb 14.4 oz)    Exam:   General:  Flat affect, mask facies, no acute distress. Having bilateral tremors. She ambulated down the hallway today.   Cardiovascular: regular rate and rhythm normal S1-S2 no murmurs rubs or gallops   Respiratory: normal respiratory effort   Abdomen: Soft nontender nondistended   Musculoskeletal:  no edema   Neurological: She seems better from a movement disorders stand point, more ambulatory. Continues to have bilateral tremors.   Data Reviewed: Basic Metabolic Panel:  Recent Labs Lab 07/06/15  2023 07/08/15 0515  NA 138 142  K 4.5 4.4  CL 104 108  CO2 26 26  GLUCOSE 151* 94  BUN 21* 15  CREATININE 1.76* 1.42*  CALCIUM 9.6 9.4   Liver Function  Tests:  Recent Labs Lab 07/06/15 2023  AST 22  ALT 16  ALKPHOS 56  BILITOT 0.5  PROT 7.1  ALBUMIN 3.9   No results for input(s): LIPASE, AMYLASE in the last 168 hours. No results for input(s): AMMONIA in the last 168 hours. CBC:  Recent Labs Lab 07/06/15 2023 07/08/15 0515  WBC 7.0 5.4  NEUTROABS 5.0  --   HGB 11.6* 10.5*  HCT 34.8* 31.9*  MCV 98.9 100.6*  PLT 225 217   Cardiac Enzymes: No results for input(s): CKTOTAL, CKMB, CKMBINDEX, TROPONINI in the last 168 hours. BNP (last 3 results) No results for input(s): BNP in the last 8760 hours.  ProBNP (last 3 results)  Recent Labs  12/06/14 1421  PROBNP 61.0    CBG: No results for input(s): GLUCAP in the last 168 hours.  Recent Results (from the past 240 hour(s))  Urine culture     Status: None   Collection Time: 07/05/15  5:24 PM  Result Value Ref Range Status   Colony Count NO GROWTH  Final   Organism ID, Bacteria NO GROWTH  Final     Studies: Dg Elbow Complete Right  07/06/2015  CLINICAL DATA:  Fall 2 days ago, right elbow pain/bruising EXAM: RIGHT ELBOW - COMPLETE 3+ VIEW COMPARISON:  None. FINDINGS: No fracture or dislocation is seen. The joint spaces are preserved. Visualized soft tissues are within normal limits. No displaced elbow joint fat pads to suggest an elbow joint effusion. IMPRESSION: No fracture or dislocation is seen. Electronically Signed   By: Julian Hy M.D.   On: 07/06/2015 16:51   Ct Head Wo Contrast  07/07/2015  CLINICAL DATA:  80 year old hypertensive female with frequent falls. Initial encounter. EXAM: CT HEAD WITHOUT CONTRAST TECHNIQUE: Contiguous axial images were obtained from the base of the skull through the vertex without intravenous contrast. COMPARISON:  01/27/2013 brain MR. 01/02/2013 head CT. FINDINGS: No skull fracture or intracranial hemorrhage. Small vessel disease type changes without CT evidence of large acute infarct. Mild global atrophy without hydrocephalus. No  intracranial mass lesion noted on this unenhanced exam. Orbital structures unremarkable. Visualized paranasal sinuses, mastoid air cells and middle ear cavities are clear. Vascular calcifications. IMPRESSION: No skull fracture or intracranial hemorrhage. Small vessel disease type changes without CT evidence of large acute infarct. Electronically Signed   By: Genia Del M.D.   On: 07/07/2015 07:19   Mr Hip Right Wo Contrast  07/07/2015  CLINICAL DATA:  Fall on 07/06/2015 with bruising of the right hip and right hip pain. EXAM: MR OF THE RIGHT HIP WITHOUT CONTRAST TECHNIQUE: Multiplanar, multisequence MR imaging was performed. No intravenous contrast was administered. COMPARISON:  07/06/2015 radiographs FINDINGS: Bones: Degenerative endplate findings in the lower lumbar spine. No fracture observed. No significant abnormal osseous edema in the hips or bony pelvis. Articular cartilage and labrum Articular cartilage: Limited assessment due to motion artifact, but not notably thinned. Labrum: Limited assessment due to motion artifact. No obvious tear. Joint or bursal effusion Joint effusion: Upper normal amount of fluid in both hip joints without overt effusion. Bursae:  No regional bursitis Muscles and tendons Muscles and tendons:  Unremarkable Other findings Miscellaneous:  Uterus absent. IMPRESSION: 1. No significant acute findings to explain the patient's right hip pain. 2.  Lower lumbar spondylosis and degenerative disc disease. 3. No significant change from the on-call preliminary report. Electronically Signed   By: Van Clines M.D.   On: 07/07/2015 07:36   Dg Hip Unilat  With Pelvis 2-3 Views Right  07/06/2015  CLINICAL DATA:  Fall 2 days ago EXAM: DG HIP (WITH OR WITHOUT PELVIS) 2-3V RIGHT COMPARISON:  None FINDINGS: There is no evidence of hip fracture or dislocation. There is no evidence of arthropathy or other focal bone abnormality. IMPRESSION: 1. No fracture. 2. If there is high clinical  suspicion for occult fracture or the patient refuses to weightbear, consider further evaluation with MRI. Although CT is expeditious, evidence is lacking regarding accuracy of CT over plain film radiography. Electronically Signed   By: Kerby Moors M.D.   On: 07/06/2015 16:53    Scheduled Meds: . amLODipine  5 mg Oral Daily  . aspirin EC  81 mg Oral Daily  . carbidopa-levodopa  1 tablet Oral TID  . [START ON 07/09/2015] enoxaparin (LOVENOX) injection  40 mg Subcutaneous Q24H  . escitalopram  10 mg Oral Daily  . ferrous sulfate  325 mg Oral Q breakfast  . levothyroxine  50 mcg Oral QAC breakfast  . mometasone-formoterol  2 puff Inhalation BID  . multivitamin with minerals  1 tablet Oral Daily  . omega-3 acid ethyl esters  1 g Oral Daily  . pantoprazole  40 mg Oral Daily  . senna-docusate  1 tablet Oral QHS  . simvastatin  20 mg Oral QHS   Continuous Infusions: . sodium chloride    . sodium chloride 75 mL/hr at 07/08/15 [id number]    Principal Problem:   Falls frequently Active Problems:   Parkinson disease (HCC)   Hypothyroidism   Contusion, hip   Cervical radiculopathy   Anxiety and depression   Gait disturbance   Hyperglycemia   FTT (failure to thrive) in adult   Time spent: 35 min   Kelvin Cellar  [facility] Pager [phone]. If 7PM-7AM, please contact night-coverage at [email or web address], password [facility] 07/08/2015, 3:13 PM

## 2015-07-08 NOTE — Clinical Social Work Note (Signed)
Clinical Social Work Assessment  Patient Details  Name: Ebelyn Bohnet MRN: [id number] Date of Birth: [birth year]  Date of referral:  07/08/15               Reason for consult:  Facility Placement                Permission sought to share information with:  Facility Sport and exercise psychologist, Family Supports Permission granted to share information::  Yes, Verbal Permission Granted  Name::     Joneen Boers  Agency::  [facility] SNF's  Relationship::  son  Contact Information:  [phone]  Housing/Transportation Living arrangements for the past 2 months:  [place] of Information:  Patient, Adult Children Patient Interpreter Needed:  None Criminal Activity/Legal Involvement Pertinent to Current Situation/Hospitalization:  No - Comment as needed Significant Relationships:  Adult Children, Spouse Lives with:  Spouse Do you feel safe going back to the place where you live?  No Need for family participation in patient care:  Yes (Comment)  Care giving concerns:  CSW received referral for possible SNF placement at time of discharge. CSW met with patient and patient's 2 sons at bedside regarding PT recommendation of SNF placement at time of discharge. Per patient, patient is currently unable to care for herself at their home given patient's current physical needs and fall risk. Patient lives w/ husband that has Alzheimer's. Patient and patient's sons expressed understanding of PT recommendation and are agreeable to SNF placement at time of discharge. Pt is considered "obeservation status" and medicare will not pay for SNF. CSW to continue to follow and assist with discharge planning needs.   Social Worker assessment / plan:  CSW spoke with patient and patient's sons concerning possibility of rehab at [facility] before returning home. Family would have to pay privately at a SNF.  Employment status:  Retired Forensic scientist:  Medicare PT Recommendations:  [place] / Referral to community resources:  [place]  Patient/Family's Response to care:  Patient and patient's sons recognize need for rehab before returning home and are agreeable to a SNF in [place]. However, patient's family reported being upset that patient is considered "observation". CSW and RNCM explained observation status. Family expressed understanding that they would have to pay privately for SNF but stated they would continue investigating observation status.   Patient/Family's Understanding of and Emotional Response to Diagnosis, Current Treatment, and Prognosis:  Patient is realistic regarding therapy needs. No questions/concerns about plan or treatment.    Emotional Assessment Appearance:  Appears stated age Attitude/Demeanor/Rapport:   (Pleasant) Affect (typically observed):  Accepting, Appropriate Orientation:  Oriented to Self, Oriented to Place, Oriented to  Time, Oriented to Situation Alcohol / Substance use:  Not Applicable Psych involvement (Current and /or in the community):  No (Comment)  Discharge Needs  Concerns to be addressed:  Care Coordination Readmission within the last 30 days:  No Current discharge risk:  None Barriers to Discharge:  No Barriers Identified   Benard Halsted, [place] 07/08/2015, 6:25 PM

## 2015-07-08 NOTE — Care Management Note (Signed)
Case Management Note  Patient Details  Name: [name] MRN: [id number] Date of Birth: [birth year]  Subjective/Objective:   Date: 07/08/15 Spoke with patient at the bedside ,she states to call her son.  NCM and CSW called patient's son.,  Verified patient lives in town, alone with spouse who has Dementia and can not be of assistance to patient at home. Son and daughter n law states that patient can not take care of herself at home and needs to go to a snf.  NCM explained that patient is observation status at this time and will need a 3 day inpatient stay in order for insurance to pay for snf or they can pay privately.  Son and daughter n law states that all they need is an fl2 to take to facility to get patient in the facility and they will be here tomorrow.  Expressed potential need for no other DME.  Verified patient anticipates to go  SNF at time of discharge and will have  no supervision by family at this time to best of their knowledge.  Patient has insurance for medications.  Verified patient has PCP Cathlean Cower.   Plan: CM will continue to follow for discharge planning and [facility] resources.                  Action/Plan:   Expected Discharge Date:                  Expected Discharge Plan:  [place]  In-House Referral:     Discharge planning Services     Post Acute Care Choice:    Choice offered to:     DME Arranged:    DME Agency:     HH Arranged:    [place] Agency:     Status of Service:  In process, will continue to follow  Medicare Important Message Given:    Date Medicare IM Given:    Medicare IM give by:    Date Additional Medicare IM Given:    Additional Medicare Important Message give by:     If discussed at [place] of Stay Meetings, dates discussed:    Additional Comments:  Zenon Mayo, RN 07/08/2015, 6:02 PM

## 2015-07-08 NOTE — Progress Notes (Signed)
Pt ambulated 143ft in hallways with RN. Delia Heady RN

## 2015-07-08 NOTE — NC FL2 (Signed)
[place] MEDICAID FL2 LEVEL OF CARE SCREENING TOOL     IDENTIFICATION  Patient Name: [name] Birthdate: [birth year] Sex: female Admission Date (Current Location): 07/06/2015  [facility] and [place] Number: Herbalist and Address:  [facility], [place] [address] [place], [place] [address]      Provider Number: [id number]  Attending Physician Name and Address:  Kelvin Cellar, MD  Relative Name and Phone Number:  Jeanella Anton [phone]    Current Level of Care: Hospital Recommended Level of Care: [place] Prior Approval Number:    Date Approved/Denied:   PASRR Number: [id number]  Discharge Plan: SNF    Current Diagnoses: Patient Active Problem List   Diagnosis Date Noted  . Contusion, hip 07/07/2015  . Falls frequently 07/07/2015  . Cervical radiculopathy 07/07/2015  . Anxiety and depression 07/07/2015  . Gait disturbance 07/07/2015  . Hyperglycemia 07/07/2015  . FTT (failure to thrive) in adult 07/07/2015  . Parkinson disease ([place])   . UTI (urinary tract infection) 07/05/2015  . Chronic neck pain 07/05/2015  . Debility 07/05/2015  . Fatigue 01/19/2015  . Dysuria 01/19/2015  . Chronic restrictive lung disease 01/04/2015  . DOE (dyspnea on exertion) 11/19/2014  . Pulmonary hypertension, moderate to severe ([place]) 11/19/2014  . Irregular heart beats 08/12/2014  . Aftercare following surgery of the circulatory system, [place] 04/19/2014  . Pain of left lower extremity-Knee 04/19/2014  . Weakness 02/11/2014  . Loss of weight 02/11/2014  . Valvular heart disease 01/19/2014  . Pulmonary hypertension ([place]) 01/19/2014  . CAP (community acquired pneumonia) 01/08/2014  . Asthma with acute exacerbation 01/06/2014  . Fall 01/06/2014  . Aortic stenosis, mild 11/18/2013  . Preventative health care 11/18/2013  . Anemia, unspecified 11/18/2013  . Tremor 09/21/2013  . Dyspnea 05/18/2013  . Physical deconditioning  05/18/2013  . OA (osteoarthritis) of knee 11/17/2012  . Hyperlipidemia 11/04/2012  . Occlusion and stenosis of carotid artery without mention of cerebral infarction 03/06/2012  . ASTHMA 10/09/2010  . Sudden visual loss 01/10/2009  . TRANSIENT ISCHEMIC ATTACK 01/10/2009  . CEREBROVASCULAR DISEASE 11/19/2007  . BRONCHITIS, RECURRENT 11/19/2007  . DIVERTICULOSIS OF COLON 11/19/2007  . Hypothyroidism 08/15/2007  . Anxiety state 08/15/2007  . Depression 08/15/2007  . Essential hypertension 08/15/2007  . GERD 08/15/2007  . Osteoarthritis 08/15/2007  . BACK PAIN, LUMBAR 08/15/2007    Orientation RESPIRATION BLADDER Height & Weight    Self, Time, Situation, Place  Normal Continent   171 lbs.  BEHAVIORAL SYMPTOMS/MOOD NEUROLOGICAL BOWEL NUTRITION STATUS      Continent  (See DC summary.)  AMBULATORY STATUS COMMUNICATION OF NEEDS Skin   Limited Assist Verbally Normal                       Personal Care Assistance Level of Assistance  Bathing, Dressing, Feeding Bathing Assistance: Limited assistance Feeding assistance: Independent Dressing Assistance: Limited assistance     Functional Limitations Info             SPECIAL CARE FACTORS FREQUENCY  PT (By licensed PT)     PT Frequency: 3x/week              Contractures Contractures Info:  (N/A)    Additional Factors Info  Code Status, Allergies Code Status Info: Full Allergies Info: Relafen, Lipitor, Other, Penicillins           Current Medications (07/08/2015):  This is the current hospital active medication list Current Facility-Administered Medications  Medication Dose Route Frequency Provider Last Rate Last Dose  . 0.9 %  sodium chloride infusion   Intravenous Continuous Norval Morton, MD   0  at 07/07/15 0500  . 0.9 %  sodium chloride infusion   Intravenous Continuous Norval Morton, MD 75 mL/hr at 07/08/15 0517    . acetaminophen (TYLENOL) tablet 650 mg  650 mg Oral Q6H PRN Norval Morton, MD   650  mg at 07/07/15 0740   Or  . acetaminophen (TYLENOL) suppository 650 mg  650 mg Rectal Q6H PRN Norval Morton, MD      . albuterol (PROVENTIL) (2.5 MG/3ML) 0.083% nebulizer solution 2.5 mg  2.5 mg Nebulization Q6H PRN Norval Morton, MD      . amLODipine (NORVASC) tablet 5 mg  5 mg Oral Daily Norval Morton, MD   5 mg at 07/08/15 0854  . aspirin EC tablet 81 mg  81 mg Oral Daily Norval Morton, MD   81 mg at 07/08/15 0853  . carbidopa-levodopa (SINEMET IR) 25-100 MG per tablet immediate release 1 tablet  1 tablet Oral TID Marliss Coots, PA-C   1 tablet at 07/08/15 1708  . diazepam (VALIUM) tablet 1 mg  1 mg Oral Q6H PRN Norval Morton, MD   1 mg at 07/08/15 1708  . [START ON 07/09/2015] enoxaparin (LOVENOX) injection 40 mg  40 mg Subcutaneous Q24H Kelvin Cellar, MD      . escitalopram (LEXAPRO) tablet 10 mg  10 mg Oral Daily Norval Morton, MD   10 mg at 07/08/15 0854  . ferrous sulfate tablet 325 mg  325 mg Oral Q breakfast Norval Morton, MD   325 mg at 07/08/15 0854  . levothyroxine (SYNTHROID, LEVOTHROID) tablet 50 mcg  50 mcg Oral QAC breakfast Norval Morton, MD   50 mcg at 07/08/15 0854  . methocarbamol (ROBAXIN) tablet 500 mg  500 mg Oral BID PRN Norval Morton, MD   500 mg at 07/07/15 0740  . mometasone-formoterol (DULERA) 200-5 MCG/ACT inhaler 2 puff  2 puff Inhalation BID Norval Morton, MD   2 puff at 07/08/15 0848  . multivitamin with minerals tablet 1 tablet  1 tablet Oral Daily Norval Morton, MD   1 tablet at 07/08/15 0854  . omega-3 acid ethyl esters (LOVAZA) capsule 1 g  1 g Oral Daily Norval Morton, MD   1 g at 07/08/15 0854  . ondansetron (ZOFRAN) tablet 4 mg  4 mg Oral Q6H PRN Norval Morton, MD       Or  . ondansetron (ZOFRAN) injection 4 mg  4 mg Intravenous Q6H PRN Rondell A Tamala Julian, MD      . pantoprazole (PROTONIX) EC tablet 40 mg  40 mg Oral Daily Norval Morton, MD   40 mg at 07/08/15 0854  . pravastatin (PRAVACHOL) tablet 20 mg  20 mg Oral q1800 Kelvin Cellar, MD   20 mg at 07/08/15 1708  . senna-docusate (Senokot-S) tablet 1 tablet  1 tablet Oral QHS Norval Morton, MD   1 tablet at 07/07/15 2126     Discharge Medications: Please see discharge summary for a list of discharge medications.  Relevant Imaging Results:  Relevant Lab Results:   Additional Information SSN: [id number]  Benard Halsted, LCSWA

## 2015-07-09 DIAGNOSIS — G20 Parkinson's disease: Secondary | ICD-10-CM | POA: Diagnosis not present

## 2015-07-09 DIAGNOSIS — R296 Repeated falls: Secondary | ICD-10-CM | POA: Diagnosis not present

## 2015-07-09 DIAGNOSIS — R627 Adult failure to thrive: Secondary | ICD-10-CM | POA: Diagnosis not present

## 2015-07-09 LAB — BASIC METABOLIC PANEL
ANION GAP: 8 (ref 5–15)
BUN: 14 mg/dL (ref 6–20)
CALCIUM: 8.9 mg/dL (ref 8.9–10.3)
CO2: 22 mmol/L (ref 22–32)
Chloride: 111 mmol/L (ref 101–111)
Creatinine, Ser: 1.25 mg/dL — ABNORMAL HIGH (ref 0.44–1.00)
GFR, EST AFRICAN AMERICAN: 47 mL/min — AB (ref >60)
GFR, EST NON AFRICAN AMERICAN: 40 mL/min — AB (ref >60)
Glucose, Bld: 101 mg/dL — ABNORMAL HIGH (ref 65–99)
Potassium: 4.1 mmol/L (ref 3.5–5.1)
Sodium: 141 mmol/L (ref 135–145)

## 2015-07-09 LAB — CBC
HCT: 28.5 % — ABNORMAL LOW (ref 36.0–46.0)
HEMOGLOBIN: 9.5 g/dL — AB (ref 12.0–15.0)
MCH: 33.5 pg (ref 26.0–34.0)
MCHC: 33.3 g/dL (ref 30.0–36.0)
MCV: 100.4 fL — ABNORMAL HIGH (ref 78.0–100.0)
PLATELETS: 200 10*3/uL (ref 150–400)
RBC: 2.84 MIL/uL — AB (ref 3.87–5.11)
RDW: 13.2 % (ref 11.5–15.5)
WBC: 5.7 10*3/uL (ref 4.0–10.5)

## 2015-07-09 MED ORDER — DIAZEPAM 2 MG PO TABS: ORAL_TABLET | ORAL | Status: DC

## 2015-07-09 MED ORDER — CARBIDOPA-LEVODOPA 25-100 MG PO TABS: 1.0000 | ORAL_TABLET | Freq: Three times a day (TID) | ORAL | Status: DC

## 2015-07-09 MED ORDER — PRAVASTATIN SODIUM 20 MG PO TABS: 20.0000 mg | ORAL_TABLET | Freq: Every day | ORAL | Status: DC

## 2015-07-09 NOTE — Care Management Note (Signed)
Case Management Note  Patient Details  Name: Lulla Linville MRN: [id number] Date of Birth: August 09, 1934  Subjective/Objective:                  Fall  Action/Plan: CM spoke with the patient, her daughter-in-law and son at the bedside. Explained the reason for observation status. CM explained the criteria for inpatient status must be met for the patient to be considered inpatient. Discussed being unable to count observation days towards a SNF admission. Daughter-in-law states the patient cannot return home due to her husband having Alzheimer's and being a Ship broker. Discussed family's concerns. Daughter-in-law states they had the patient in their home for 1 week and were unable to get enough sleep. Reports they were awakened every 20 minutes to attend to the patient's needs. Patient's son mentions having an attorney handle the observation situation and request for SNF at this point. His wife states he will be wasting him time to have an attorney get involved. The family states the patient's home is not appropriate for home health services. They agree to discuss options for placement further with the CSW.   Expected Discharge Date:    07/09/15              Expected Discharge Plan:  [place]  In-House Referral:     Discharge planning Services     Post Acute Care Choice:    Choice offered to:  Patient, Adult Children  DME Arranged:    DME Agency:     HH Arranged:    [place] Agency:     Status of Service:  In process, will continue to follow  Medicare Important Message Given:    Date Medicare IM Given:    Medicare IM give by:    Date Additional Medicare IM Given:    Additional Medicare Important Message give by:     If discussed at [place] of Stay Meetings, dates discussed:    Additional Comments:  Apolonio Schneiders, RN 07/09/2015, 4:01 PM

## 2015-07-09 NOTE — Social Work (Signed)
CSW met with family in order to facilitate discharge. Patient's son was willing to pay out of pocket for placement at [facility]. This CSW discussed with [place] who identified that patient WILL NOT be accepted to the facility without insurance to cover skilled needs. CSW provided family with this update and they have stated that they will bring patient home.   Patient discharging home.  No further CSW needs. CSW signing off.   Christene Lye MSW, [place]

## 2015-07-09 NOTE — Social Work (Signed)
Spoke with patient and son Joneen Boers. Patient and son agreed that patient would go to [facility] and pay privately for a room. Patient's son ensured that they had contact information for follow up. CSW contacted facility to also make them aware. CSW awaiting discharge summary to facilitate dc to facility.  Christene Lye MSW, [place]

## 2015-07-09 NOTE — Discharge Summary (Addendum)
Physician Discharge Summary  [name] [id number] DOB: 1935/07/17 DOA: 07/06/2015  PCP: Cathlean Cower, MD  Admit date: 07/06/2015 Discharge date: 07/10/2015  Time spent: 35 minutes  Recommendations for Outpatient Follow-up:  1. Please follow up on parkinson's, she was diagnosed during this hospitalization and started on Sinemet. Will need to be follow up by Neurology in the outpatient setting.  2. She was discharged to SNF on 07/09/2015   Discharge Diagnoses:  Principal Problem:   Falls frequently Active Problems:   Parkinson disease ([place])   Hypothyroidism   Contusion, hip   Cervical radiculopathy   Anxiety and depression   Gait disturbance   Hyperglycemia   FTT (failure to thrive) in adult   Discharge Condition: Stable  Diet recommendation: Heart Healthy  Filed Weights   07/07/15 1133  Weight: 77.973 kg (171 lb 14.4 oz)    History of present illness:  This 80 year old female with a past medical history significant for hypothyroidism, degenerative disc disease, hypertension, previous CVA with residual tremor; presents after a fall 2 days ago with inability to ambulate and increased pain. The incident occurred at night while trying to get up to use the bathroom. Patient was noted to have just finished a course of antibiotics for a UTI. Patient likely had taken a Valium that night for symptoms of anxiety as well as also relaxants for neck pain. She was noted to have injured her right elbow and right hip. As patient was having increasing difficulty in walking thereafter they became concerned and it was advised that they bring her to the hospital for further evaluation. Family at bedside and provided additional history states over the last at least 2 months months the patient had increased falls. She has a husband with dementia and back issues with home she lives and has been unable to care for herself. Question the possibility of the patient being placed in a possible nursing  facility.  Family states that patient has a follow-up appointment with Dr. Newman Pies neurosurgery on December for evaluation of ruptured C4-C7 disks. Patient had just received an epidural injection today of that area. Following the procedure patient had nausea, but appears to be now resolved. Family concern that the patient could have a fall that could injure her neck and leave her paralyzed. Multiple other concerns including the possibility of Parkinson's disease due to a tremor on that appears preferred notes definite been present following a CVA. Since that time patient has had an difficulty with balance.  Upon arrival to the emergency department, initial x-rays of the right hip and elbow show no acute fractures.  Hospital Course:  [name] is a pleasant 80 year old female with a past medical history of CVA 6 years ago, degenerative disc disease, hypertension, presented overnight to the emergency department with complaints of recurrent falls. He remembers also noting progressive functional decline requiring greater assistance with her activities of daily living. Initial workup included a CT scan of brain that did not reveal acute intracranial abnormalities. Labs did reveal the presence of acute on chronic renal insufficiency having a creatinine of 1.76, increased from 1.42 on 06/28/2015. On physical examination she had rhythmic tremors associated with cogwheeling and shuffling gait. There was clinical suspicion for Parkinson's disease for which neurology was consulted. She was started on Sinemet.    Recurrent falls/functional decline. -[name] presenting with complaints of recurrent falls requiring greater assistance with her activities of daily living. Family members have been concerned with this progressive decline. -I suspect that her falls  are multifactorial with Parkinson's disease, dehydration/AKI, physical deconditioning all likely contributors. -Neurology was consulted as she was  started on Sinemet therapy on 07/08/2015. -Patient seems improved today after starting Sinemet therapy. She was able to ambulate 150 ft down the hallway.   2. Parkinson's disease. -Patient presenting with recurrent falls, noted to have rhythmic tremors involving upper extremities, cogwheeling, shuffling gait, retropulsion supporting diagnosis of Parkinson's disease. Neurology was consulted who evaluated patient on 07/07/2015. -She was started on Sinemet 25/100 one tablet by mouth 3 times a day -Ambulating better today  3. Acute kidney injury. -I think this may be related to decreased by mouth intake in the context offunctional decline/failure to thrive. -She was started on IV fluids -Repeat labs showing Cr of 1.25 on 07/09/2015   4. Hypothyroidism.  -She last had her TSH checked on 01/19/2015 that was within normal limits at 1.48.  -Will continue Synthroid 50 g by mouth daily. -TSH within normal limits at 0.628  5. History of CVA. -Continue aspirin and statin therapy  Addendum I personally evaluated patient on 07/10/2015. She was kept overnight since family members expressed concerns for taking her home and wished for [name] to receive rehab at [facility]. I was informed that she did not qualify for inpatient status and medicare 20 day rehab and remained observation status. Multiple meetings with case management were held to assist patient and family members with transition. From a medical standpoint I think her tremors have improved since starting Sinemet. She was able to ambulate longer distance with her walker. Labs on 07/09/2015 showed creatinine coming down to 1.25 from 1.76 on 07/06/2015. She was tolerating PO intake. She was discharged today to her home and family will arrange for SNF on 07/11/2015 as a private pay.   Consultations: Neurology  Discharge Exam: Filed Vitals:   07/09/15 0540 07/09/15 1354  BP: 131/49 133/61  Pulse: 68 80  Temp: 98 F (36.7 C) 98 F (36.7 C)   Resp: 18 20     General: Flat affect, mask facies, no acute distress. Ha  Cardiovascular: regular rate and rhythm normal S1-S2 no murmurs rubs or gallops   Respiratory: normal respiratory effort   Abdomen: Soft nontender nondistended   Musculoskeletal: no edema   Neurological: She seems better from a movement disorders stand point, more ambulatory. On 07/10/2015 she has shown improvement to her tremors.   Discharge Instructions   Discharge Instructions    Ambulatory referral to Home Health    Complete by:  As directed   Please evaluate Derrian Belshe for admission to [facility].  Disciplines requested: Nursing, Physical Therapy, Occupational Therapy, Speech Language Pathology, Medical Social Work and Nurse Aide  Services to provide: Strengthening Exercises and Evaluate  Physician to follow patient's care (the person listed here will be responsible for signing ongoing orders): PCP  Requested Start of Care Date: Tomorrow  I certify that this patient is under my care and that I, or a Nurse Practitioner or Physician's Assistant working with me, had a face-to-face encounter that meets the physician face-to-face requirements with patient on 07/06/2015. The encounter with the patient was in whole, or in part for the following medical condition(s) which is the primary reason for home health care (List medical condition). Hip pain, frequent falls, worsening balance and difficulty ambulating  Special Instructions:  none  Does the patient have Medicare or Medicaid?:  Yes  The encounter with the patient was in whole, or in part, for the following medical condition, which is the  primary reason for home health care:  hip pain from falling, worsening balance and coordination affecting ambulation  Reason for Medically Necessary Home Health Services:  Skilled Nursing- Change/Decline in Patient Status  My clinical findings support the need for the above services:   Pain interferes with  ambulation/mobility Unable to leave home safely without assistance and/or assistive device Unsafe ambulation due to balance issues    I certify that, based on my findings, the following services are medically necessary home health services:   Nursing Physical therapy Speech language pathology    Further, I certify that my clinical findings support that this patient is homebound due to:   Unsafe ambulation due to balance issues Pain interferes with ambulation/mobility Unable to leave home safely without assistance       Ambulatory referral to Neurology    Complete by:  As directed   An appointment is requested in approximately: 2 weeks     Call MD for:  difficulty breathing, headache or visual disturbances    Complete by:  As directed      Call MD for:  extreme fatigue    Complete by:  As directed      Call MD for:  hives    Complete by:  As directed      Call MD for:  persistant dizziness or light-headedness    Complete by:  As directed      Call MD for:  persistant nausea and vomiting    Complete by:  As directed      Call MD for:  severe uncontrolled pain    Complete by:  As directed      Call MD for:  temperature >100.4    Complete by:  As directed      Diet - low sodium heart healthy    Complete by:  As directed      Increase activity slowly    Complete by:  As directed           Current Discharge Medication List    START taking these medications   Details  carbidopa-levodopa (SINEMET IR) 25-100 MG tablet Take 1 tablet by mouth 3 (three) times daily. Qty: 90 tablet, Refills: 1    pravastatin (PRAVACHOL) 20 MG tablet Take 1 tablet (20 mg total) by mouth daily at 6 PM. Qty: 30 tablet, Refills: 1      CONTINUE these medications which have CHANGED   Details  diazepam (VALIUM) 2 MG tablet 1/2 - 1 tab by mouth twice per day as needed Qty: 20 tablet, Refills: 0      CONTINUE these medications which have NOT CHANGED   Details  ADVAIR HFA 230-21 MCG/ACT inhaler  Inhale 2 puffs into the lungs 2 (two) times daily. Qty: 1 Inhaler, Refills: 3    albuterol (PROVENTIL HFA;VENTOLIN HFA) 108 (90 BASE) MCG/ACT inhaler Inhale 2 puffs into the lungs every 6 (six) hours as needed for wheezing or shortness of breath. Qty: 3 Inhaler, Refills: 3    amLODipine (NORVASC) 5 MG tablet Take 1 tablet (5 mg total) by mouth daily. Qty: 90 tablet, Refills: 3   Associated Diagnoses: Essential hypertension    aspirin EC 81 MG tablet Take 1 tablet (81 mg total) by mouth daily. Qty: 90 tablet, Refills: 3   Associated Diagnoses: Mitral regurgitation    Cranberry-Vitamin C-Probiotic (AZO CRANBERRY) 250-30 MG TABS Take 1 capsule by mouth daily.    escitalopram (LEXAPRO) 10 MG tablet Take 1 tablet (10 mg total) by mouth daily.  Qty: 90 tablet, Refills: 3    ferrous sulfate 325 (65 FE) MG tablet Take 325 mg by mouth daily with breakfast.    fish oil-omega-3 fatty acids 1000 MG capsule Take 1 g by mouth daily.     levothyroxine (SYNTHROID, LEVOTHROID) 50 MCG tablet Take 1 tablet (50 mcg total) by mouth daily. Qty: 90 tablet, Refills: 3    methocarbamol (ROBAXIN) 500 MG tablet Take 1 tablet (500 mg total) by mouth 2 (two) times daily as needed for muscle spasms. Qty: 10 tablet, Refills: 0    Nutritional Supplements (OSTEO ADVANCE) TABS Take 2 tablets by mouth daily.     omeprazole (PRILOSEC) 20 MG capsule Take 1 capsule (20 mg total) by mouth daily. Qty: 90 capsule, Refills: 3      STOP taking these medications     simvastatin (ZOCOR) 20 MG tablet      tiZANidine (ZANAFLEX) 4 MG tablet        Allergies  Allergen Reactions  . Relafen [Nabumetone] Diarrhea and Other (See Comments)    GI / Urinary Bleeding  . Lipitor [Atorvastatin] Other (See Comments)    Causes memory loss  . Other Other (See Comments)    "orvail" unknown:  Causes Bloody stool  . Penicillins Hives, Itching, Swelling and Rash    Has patient had a PCN reaction causing immediate rash,  facial/tongue/throat swelling, SOB or lightheadedness with hypotension: no Has patient had a PCN reaction causing severe rash involving mucus membranes or skin necrosis: unknown Has patient had a PCN reaction that required hospitalization no Has patient had a PCN reaction occurring within the last 10 years: no If all of the above answers are "NO", then may proceed with Cephalosporin use.    Follow-up Information    Follow up with Lenor Coffin, MD In 2 weeks.   Specialty:  Neurology   Contact information:   [address] [place] [address] [phone]       Follow up with Cathlean Cower, MD In 2 weeks.   Specialties:  Internal Medicine, Radiology   Contact information:   [address] [place] [address] [phone]        The results of significant diagnostics from this hospitalization (including imaging, microbiology, ancillary and laboratory) are listed below for reference.    Significant Diagnostic Studies: Dg Elbow Complete Right  07/06/2015  CLINICAL DATA:  Fall 2 days ago, right elbow pain/bruising EXAM: RIGHT ELBOW - COMPLETE 3+ VIEW COMPARISON:  None. FINDINGS: No fracture or dislocation is seen. The joint spaces are preserved. Visualized soft tissues are within normal limits. No displaced elbow joint fat pads to suggest an elbow joint effusion. IMPRESSION: No fracture or dislocation is seen. Electronically Signed   By: Julian Hy M.D.   On: 07/06/2015 16:51   Ct Head Wo Contrast  07/07/2015  CLINICAL DATA:  80 year old hypertensive female with frequent falls. Initial encounter. EXAM: CT HEAD WITHOUT CONTRAST TECHNIQUE: Contiguous axial images were obtained from the base of the skull through the vertex without intravenous contrast. COMPARISON:  01/27/2013 brain MR. 01/02/2013 head CT. FINDINGS: No skull fracture or intracranial hemorrhage. Small vessel disease type changes without CT evidence of large acute infarct. Mild global atrophy  without hydrocephalus. No intracranial mass lesion noted on this unenhanced exam. Orbital structures unremarkable. Visualized paranasal sinuses, mastoid air cells and middle ear cavities are clear. Vascular calcifications. IMPRESSION: No skull fracture or intracranial hemorrhage. Small vessel disease type changes without CT evidence of large  acute infarct. Electronically Signed   By: Genia Del M.D.   On: 07/07/2015 07:19   Mr Hip Right Wo Contrast  07/07/2015  CLINICAL DATA:  Fall on 07/06/2015 with bruising of the right hip and right hip pain. EXAM: MR OF THE RIGHT HIP WITHOUT CONTRAST TECHNIQUE: Multiplanar, multisequence MR imaging was performed. No intravenous contrast was administered. COMPARISON:  07/06/2015 radiographs FINDINGS: Bones: Degenerative endplate findings in the lower lumbar spine. No fracture observed. No significant abnormal osseous edema in the hips or bony pelvis. Articular cartilage and labrum Articular cartilage: Limited assessment due to motion artifact, but not notably thinned. Labrum: Limited assessment due to motion artifact. No obvious tear. Joint or bursal effusion Joint effusion: Upper normal amount of fluid in both hip joints without overt effusion. Bursae:  No regional bursitis Muscles and tendons Muscles and tendons:  Unremarkable Other findings Miscellaneous:  Uterus absent. IMPRESSION: 1. No significant acute findings to explain the patient's right hip pain. 2. Lower lumbar spondylosis and degenerative disc disease. 3. No significant change from the on-call preliminary report. Electronically Signed   By: Van Clines M.D.   On: 07/07/2015 07:36   Dg Hip Unilat  With Pelvis 2-3 Views Right  07/06/2015  CLINICAL DATA:  Fall 2 days ago EXAM: DG HIP (WITH OR WITHOUT PELVIS) 2-3V RIGHT COMPARISON:  None FINDINGS: There is no evidence of hip fracture or dislocation. There is no evidence of arthropathy or other focal bone abnormality. IMPRESSION: 1. No fracture. 2. If  there is high clinical suspicion for occult fracture or the patient refuses to weightbear, consider further evaluation with MRI. Although CT is expeditious, evidence is lacking regarding accuracy of CT over plain film radiography. Electronically Signed   By: Kerby Moors M.D.   On: 07/06/2015 16:53    Microbiology: Recent Results (from the past 240 hour(s))  Urine culture     Status: None   Collection Time: 07/05/15  5:24 PM  Result Value Ref Range Status   Colony Count NO GROWTH  Final   Organism ID, Bacteria NO GROWTH  Final     Labs: Basic Metabolic Panel:  Recent Labs Lab 07/06/15 2023 07/08/15 0515 07/09/15 0507  NA 138 142 141  K 4.5 4.4 4.1  CL 104 108 111  CO2 26 26 22   GLUCOSE 151* 94 101*  BUN 21* 15 14  CREATININE 1.76* 1.42* 1.25*  CALCIUM 9.6 9.4 8.9   Liver Function Tests:  Recent Labs Lab 07/06/15 2023  AST 22  ALT 16  ALKPHOS 56  BILITOT 0.5  PROT 7.1  ALBUMIN 3.9   No results for input(s): LIPASE, AMYLASE in the last 168 hours. No results for input(s): AMMONIA in the last 168 hours. CBC:  Recent Labs Lab 07/06/15 2023 07/08/15 0515 07/09/15 0507  WBC 7.0 5.4 5.7  NEUTROABS 5.0  --   --   HGB 11.6* 10.5* 9.5*  HCT 34.8* 31.9* 28.5*  MCV 98.9 100.6* 100.4*  PLT 225 217 200   Cardiac Enzymes: No results for input(s): CKTOTAL, CKMB, CKMBINDEX, TROPONINI in the last 168 hours. BNP: BNP (last 3 results) No results for input(s): BNP in the last 8760 hours.  ProBNP (last 3 results)  Recent Labs  12/06/14 1421  PROBNP 61.0    CBG: No results for input(s): GLUCAP in the last 168 hours.     SignedKelvin Cellar  [facility] 07/09/2015, 2:19 PM

## 2015-07-09 NOTE — Progress Notes (Signed)
Spoke with Crystal, CM, who spoke with family re: developing a plan for discharge, hopefully tomorrow, given the unsafe situation at home and patient requiring 24/7 care.  Dr. Coralyn Pear aware of need to hold discharge until tomorrow given the current situation.  Family has indicated willingness to accept [facility] care assistance and will meet with CM tomorrow to set up workable and individualized plan fora  safe discharge home.

## 2015-07-10 DIAGNOSIS — G20 Parkinson's disease: Secondary | ICD-10-CM | POA: Diagnosis not present

## 2015-07-10 DIAGNOSIS — R52 Pain, unspecified: Secondary | ICD-10-CM | POA: Diagnosis not present

## 2015-07-10 DIAGNOSIS — R531 Weakness: Secondary | ICD-10-CM | POA: Diagnosis not present

## 2015-07-10 NOTE — Care Management (Signed)
Late entry for 07/09/15 at 5:10 PM - CM spoke with the patient's daughter, Julian Hy via telephone. Leda Gauze states the patient cannot be discharged to her home with home care because she recently had surgery. She states her brother and sister-in-law are not able to manage their mother at their home and she is unable to discharge to her own home. Discussed the family considering which family member the patient can go home with on 07/10/15 if SW is unable to locate a SNF willing to accept her as private-pay. Leda Gauze states a family member will be available in the patient's room on 07/10/15 around noon. Discussed CM and SW will follow-up with them on 12/11. Presenter, broadcasting BSN CCM

## 2015-07-10 NOTE — Care Management (Signed)
CM and CSW spoke with patient and daughter, Leda Gauze, at the bedside. Per daughter and patient, the plan to pursue SNF placement. Presenter, broadcasting BSN CCM

## 2015-07-10 NOTE — Social Work (Signed)
CSW attempted to contact family with 2 additional private pay bed offers. Message left.  Christene Lye MSW, [place]

## 2015-07-10 NOTE — Social Work (Signed)
CSW provided patient and daughter with three bed offers for private pay at 12noon. Patient's daughter stated that she wanted to discuss with her brother before making a final decision. She called and chose a facility at 2:30pm, at which time the facility was no longer able to offer a bed for today but could offer placement on 07/11/15. Patient and family agreed for the patient to go home and pursue placement on 07/11/15 from home as a private pay.   CSW arranging transportation via EMS.   No further CSW needs as patient is discharging.   Christene Lye MSW, LCSW

## 2015-07-11 ENCOUNTER — Ambulatory Visit (INDEPENDENT_AMBULATORY_CARE_PROVIDER_SITE_OTHER): Payer: Medicare Other

## 2015-07-11 ENCOUNTER — Ambulatory Visit (INDEPENDENT_AMBULATORY_CARE_PROVIDER_SITE_OTHER): Payer: Medicare Other | Admitting: Internal Medicine

## 2015-07-11 VITALS — BP 132/66 | HR 82 | Temp 97.6°F | Resp 17 | Ht 65.0 in | Wt 161.0 lb

## 2015-07-11 DIAGNOSIS — S7002XA Contusion of left hip, initial encounter: Secondary | ICD-10-CM

## 2015-07-11 DIAGNOSIS — S7001XD Contusion of right hip, subsequent encounter: Secondary | ICD-10-CM

## 2015-07-11 DIAGNOSIS — M25552 Pain in left hip: Secondary | ICD-10-CM

## 2015-07-11 DIAGNOSIS — I6523 Occlusion and stenosis of bilateral carotid arteries: Secondary | ICD-10-CM

## 2015-07-11 NOTE — Progress Notes (Signed)
Subjective:  By signing my name below, I, [name], attest that this documentation has been prepared under the direction and in the presence of Angel Koyanagi, MD Electronically Signed: [name], ED Scribe 07/11/2015 at 6:30 PM.   Patient ID: [name], female    DOB: 12-06-1934, 80 y.o.   MRN: [id number]  Chief Complaint  Patient presents with  . other    x ray left leg, last night fell her nurse home   HPI HPI Comments: [name] is a 80 y.o. female who presents to the [facility] complaining of multiple falls over the past week. Pt was recently discharged from [facility] last night following a fall that occurred last week. She reports a bruise to her right hip and right elbow from the fall last week. Pt also states that she fell again last night on hardwood floor and landed on her left leg. She has noticed a small bruise to her left leg although she denies left leg pain at this time. She also reports worsening imbalance that she has noticed over the past week. Pt was recently diagnosed with Parkinson's and her unsteady gait with falls is due to this.. Her daughter states that they are trying to have the pt admitted to [facility] and [place] but the facility is requesting an XR of the hips first since has had a fall following the hospital d/c.   Patient Active Problem List   Diagnosis Date Noted  . Contusion, hip 07/07/2015  . Falls frequently 07/07/2015  . Cervical radiculopathy 07/07/2015  . Anxiety and depression 07/07/2015  . Gait disturbance 07/07/2015  . Hyperglycemia 07/07/2015  . FTT (failure to thrive) in adult 07/07/2015  . Parkinson disease ([place])   . UTI (urinary tract infection) 07/05/2015  . Chronic neck pain 07/05/2015  . Debility 07/05/2015  . Fatigue 01/19/2015  . Dysuria 01/19/2015  . Chronic restrictive lung disease 01/04/2015  . DOE (dyspnea on exertion) 11/19/2014  . Pulmonary hypertension, moderate to severe  ([place]) 11/19/2014  . Irregular heart beats 08/12/2014  . Aftercare following surgery of the circulatory system, [place] 04/19/2014  . Pain of left lower extremity-Knee 04/19/2014  . Weakness 02/11/2014  . Loss of weight 02/11/2014  . Valvular heart disease 01/19/2014  . Pulmonary hypertension ([place]) 01/19/2014  . CAP (community acquired pneumonia) 01/08/2014  . Asthma with acute exacerbation 01/06/2014  . Fall 01/06/2014  . Aortic stenosis, mild 11/18/2013  . Preventative health care 11/18/2013  . Anemia, unspecified 11/18/2013  . Tremor 09/21/2013  . Dyspnea 05/18/2013  . Physical deconditioning 05/18/2013  . OA (osteoarthritis) of knee 11/17/2012  . Hyperlipidemia 11/04/2012  . Occlusion and stenosis of carotid artery without mention of cerebral infarction 03/06/2012  . ASTHMA 10/09/2010  . Sudden visual loss 01/10/2009  . TRANSIENT ISCHEMIC ATTACK 01/10/2009  . CEREBROVASCULAR DISEASE 11/19/2007  . BRONCHITIS, RECURRENT 11/19/2007  . DIVERTICULOSIS OF COLON 11/19/2007  . Hypothyroidism 08/15/2007  . Anxiety state 08/15/2007  . Depression 08/15/2007  . Essential hypertension 08/15/2007  . GERD 08/15/2007  . Osteoarthritis 08/15/2007  . BACK PAIN, LUMBAR 08/15/2007    Current Outpatient Prescriptions on File Prior to Visit  Medication Sig Dispense Refill  . ADVAIR HFA 230-21 MCG/ACT inhaler Inhale 2 puffs into the lungs 2 (two) times daily. 1 Inhaler 3  . albuterol (PROVENTIL HFA;VENTOLIN HFA) 108 (90 BASE) MCG/ACT inhaler Inhale 2 puffs into the lungs every 6 (six) hours as needed for wheezing or shortness of breath.  3 Inhaler 3  . amLODipine (NORVASC) 5 MG tablet Take 1 tablet (5 mg total) by mouth daily. 90 tablet 3  . aspirin EC 81 MG tablet Take 1 tablet (81 mg total) by mouth daily. 90 tablet 3  . carbidopa-levodopa (SINEMET IR) 25-100 MG tablet Take 1 tablet by mouth 3 (three) times daily. 90 tablet 1  . Cranberry-Vitamin C-Probiotic (AZO CRANBERRY) 250-30 MG TABS Take 1  capsule by mouth daily.    . diazepam (VALIUM) 2 MG tablet 1/2 - 1 tab by mouth twice per day as needed 20 tablet 0  . escitalopram (LEXAPRO) 10 MG tablet Take 1 tablet (10 mg total) by mouth daily. 90 tablet 3  . ferrous sulfate 325 (65 FE) MG tablet Take 325 mg by mouth daily with breakfast.    . fish oil-omega-3 fatty acids 1000 MG capsule Take 1 g by mouth daily.     Marland Kitchen levothyroxine (SYNTHROID, LEVOTHROID) 50 MCG tablet Take 1 tablet (50 mcg total) by mouth daily. 90 tablet 3  . methocarbamol (ROBAXIN) 500 MG tablet Take 1 tablet (500 mg total) by mouth 2 (two) times daily as needed for muscle spasms. 10 tablet 0  . Nutritional Supplements (OSTEO ADVANCE) TABS Take 2 tablets by mouth daily.     Marland Kitchen omeprazole (PRILOSEC) 20 MG capsule Take 1 capsule (20 mg total) by mouth daily. 90 capsule 3  . pravastatin (PRAVACHOL) 20 MG tablet Take 1 tablet (20 mg total) by mouth daily at 6 PM. 30 tablet 1   No current facility-administered medications on file prior to visit.   Allergies  Allergen Reactions  . Relafen [Nabumetone] Diarrhea and Other (See Comments)    GI / Urinary Bleeding  . Lipitor [Atorvastatin] Other (See Comments)    Causes memory loss  . Other Other (See Comments)    "orvail" unknown:  Causes Bloody stool  . Penicillins Hives, Itching, Swelling and Rash    Has patient had a PCN reaction causing immediate rash, facial/tongue/throat swelling, SOB or lightheadedness with hypotension: no Has patient had a PCN reaction causing severe rash involving mucus membranes or skin necrosis: unknown Has patient had a PCN reaction that required hospitalization no Has patient had a PCN reaction occurring within the last 10 years: no If all of the above answers are "NO", then may proceed with Cephalosporin use.    Review of Systems  Musculoskeletal: Negative for arthralgias.  Skin: Positive for color change.  Neurological:       + Imbalance      Objective:   Physical Exam    Constitutional: She is oriented to person, place, and time. She appears well-developed and well-nourished. No distress.  HENT:  Head: Normocephalic and atraumatic.  Eyes: Conjunctivae and EOM are normal.  Neck: Neck supple.  Cardiovascular: Normal rate.   Pulmonary/Chest: Effort normal.  Musculoskeletal: Normal range of motion.  R hip has good ROM although there is bruising to R hip and R elbow from a prior fall. L hip has pain with external rotation that is mild with a fairly good ROM.   Neurological: She is alert and oriented to person, place, and time.  Skin: Skin is warm and dry.  Psychiatric: She has a normal mood and affect. Her behavior is normal.  Nursing note and vitals reviewed. XR reading by Dr. Laney Pastor: no hip fx R or L    Assessment & Plan:  Pain, hip, left - Plan: DG HIP UNILAT W OR W/O PELVIS 2-3 VIEWS LEFT  Contusion,  hip, left, initial encounter  Contusion, hip, right, subsequent encounter  Reassured//letter to facility  I have completed the patient encounter in its entirety as documented by the scribe, with editing by me where necessary. Robert P. Laney Pastor, M.D.

## 2015-07-12 ENCOUNTER — Encounter: Payer: Self-pay | Admitting: Internal Medicine

## 2015-07-12 ENCOUNTER — Telehealth: Payer: Self-pay | Admitting: *Deleted

## 2015-07-12 NOTE — Telephone Encounter (Signed)
Pt was on TCM list admitted for frequent fall D/C 07/10/15 sent to SNF...[name]

## 2015-07-13 ENCOUNTER — Other Ambulatory Visit (HOSPITAL_COMMUNITY): Payer: Self-pay | Admitting: Neurology

## 2015-07-13 ENCOUNTER — Encounter: Payer: Self-pay | Admitting: Neurology

## 2015-07-13 ENCOUNTER — Ambulatory Visit (INDEPENDENT_AMBULATORY_CARE_PROVIDER_SITE_OTHER): Payer: Medicare Other | Admitting: Neurology

## 2015-07-13 VITALS — BP 100/64 | HR 85 | Ht 65.0 in | Wt 172.0 lb

## 2015-07-13 DIAGNOSIS — I6523 Occlusion and stenosis of bilateral carotid arteries: Secondary | ICD-10-CM | POA: Diagnosis not present

## 2015-07-13 DIAGNOSIS — F458 Other somatoform disorders: Secondary | ICD-10-CM | POA: Diagnosis not present

## 2015-07-13 DIAGNOSIS — R1319 Other dysphagia: Secondary | ICD-10-CM

## 2015-07-13 DIAGNOSIS — Z9181 History of falling: Secondary | ICD-10-CM | POA: Diagnosis not present

## 2015-07-13 DIAGNOSIS — S130XXD Traumatic rupture of cervical intervertebral disc, subsequent encounter: Secondary | ICD-10-CM | POA: Diagnosis not present

## 2015-07-13 DIAGNOSIS — M6281 Muscle weakness (generalized): Secondary | ICD-10-CM | POA: Diagnosis not present

## 2015-07-13 DIAGNOSIS — M545 Low back pain: Secondary | ICD-10-CM | POA: Diagnosis not present

## 2015-07-13 DIAGNOSIS — R1310 Dysphagia, unspecified: Secondary | ICD-10-CM

## 2015-07-13 DIAGNOSIS — R278 Other lack of coordination: Secondary | ICD-10-CM | POA: Diagnosis not present

## 2015-07-13 DIAGNOSIS — R262 Difficulty in walking, not elsewhere classified: Secondary | ICD-10-CM | POA: Diagnosis not present

## 2015-07-13 DIAGNOSIS — J45909 Unspecified asthma, uncomplicated: Secondary | ICD-10-CM

## 2015-07-13 DIAGNOSIS — G20 Parkinson's disease: Secondary | ICD-10-CM

## 2015-07-13 NOTE — Patient Instructions (Signed)
1. We have scheduled you at [facility] for your modified barium swallow on 08/05/15 at 1:00 pm. Please arrive 15 minutes prior and go to 1st floor radiology. If you need to reschedule for any reason please call [phone]. 2. Order to PT/OT/ST given  3. Increase Carbidopa Levodopa 25/100 to 1.5 tablets 3 times daily at 7 am, 11 am, 4 pm

## 2015-07-13 NOTE — Progress Notes (Signed)
[name] was seen today in the movement disorders clinic for neurologic consultation at the request of Cathlean Cower, MD.   The patient is accompanied by her daughter and granddaughter who supplements the history.  The patient is seen today in neurologic consultation for possible Parkinson's disease.  I have reviewed an extensive number of records made available to me.  The patient was hospitalized on December 8 after a fall 2 days prior to admission.  The fall occurred after the patient got up at night while attempting to use the bathroom.  She did apparently taken a Valium earlier that night.  This was just one of many falls that she had sustained.  Her family had questioned whether or not she had had Parkinson's disease and a neurologic consultation was obtained while she was in the hospital.  The patient saw Dr. Leonel Ramsay and he felt that her symptoms were consistent with possible Parkinson's disease.  She was started on levodopa, 1 po tid and told to follow-up here.  Pt and family indicate that it has helped walking and tremor.  She is currently in skilled rehab for 30 days but is only in for short term and has only been there for 1 day.    The patient has previously seen [facility] and last saw [facility] neurology on 03/15/2015.  She has seen Dr. [name] as well as several of his nurse practitioners.  I reviewed those records in detail.  She had a TIA on 01/03/2013 but there was no tremor when seen by [facility] neurology in July, 2014.  On her follow-up in January, 2015 notes indicate that "action tremor" had developed in the right hand and a resting tremor had developed in the right foot.  In February, 2016 notes indicate that the patient now had decreased arm swing and a mild resting tremor of the right hand and Mysoline was offered to the patient but she did not want that.  On 03/15/2015 the patient reported more falls and it was recommended that they just monitor the tremor.  No new  medications or therapy were given.  She reports fam hx of PD in maternal aunt.    Specific Symptoms:  Tremor: Yes.  , started in R hand and then R foot but developed in L hand in the last 6 months Voice: voice is weaker Sleep: sleeps well except up multiple times to go to the bathroom  Vivid Dreams:  No.  Acting out dreams:  No. Wet Pillows: No. Postural symptoms:  Yes.  ; used walker for at least 6-8 months  Falls?  Yes.   Bradykinesia symptoms: shuffling gait, slow movements, difficulty getting out of a chair, difficulty regaining balance and decreased arm swing Loss of smell:  No. Loss of taste:  No. Urinary Incontinence:  No.  Constipation: yes, but uses senakot Difficulty Swallowing:  Yes.   Handwriting, micrographia: Yes.   Trouble with ADL's:  Yes.    Trouble buttoning clothing: Yes.   Depression:  Yes.  , but also states that cries spontaneously for no reason Memory changes:  Occasionally will repeat self Hallucinations:  No.  visual distortions: Yes.   N/V:  No. Lightheaded:  Yes.  , attributes to low blood sugar  Syncope: No. Diplopia:  Yes.  , but only intermittently   ALLERGIES:   Allergies  Allergen Reactions  . Relafen [Nabumetone] Diarrhea and Other (See Comments)    GI / Urinary Bleeding  . Lipitor [Atorvastatin] Other (See Comments)  Causes memory loss  . Other Other (See Comments)    "orvail" unknown:  Causes Bloody stool  . Penicillins Hives, Itching, Swelling and Rash    Has patient had a PCN reaction causing immediate rash, facial/tongue/throat swelling, SOB or lightheadedness with hypotension: no Has patient had a PCN reaction causing severe rash involving mucus membranes or skin necrosis: unknown Has patient had a PCN reaction that required hospitalization no Has patient had a PCN reaction occurring within the last 10 years: no If all of the above answers are "NO", then may proceed with Cephalosporin use.     CURRENT MEDICATIONS:    Outpatient Encounter Prescriptions as of 07/13/2015  Medication Sig  . ADVAIR HFA 230-21 MCG/ACT inhaler Inhale 2 puffs into the lungs 2 (two) times daily.  Marland Kitchen albuterol (PROVENTIL HFA;VENTOLIN HFA) 108 (90 BASE) MCG/ACT inhaler Inhale 2 puffs into the lungs every 6 (six) hours as needed for wheezing or shortness of breath.  Marland Kitchen amLODipine (NORVASC) 5 MG tablet Take 1 tablet (5 mg total) by mouth daily.  Marland Kitchen aspirin EC 81 MG tablet Take 1 tablet (81 mg total) by mouth daily.  . carbidopa-levodopa (SINEMET IR) 25-100 MG tablet Take 1 tablet by mouth 3 (three) times daily.  . Cranberry-Vitamin C-Probiotic (AZO CRANBERRY) 250-30 MG TABS Take 1 capsule by mouth daily.  . diazepam (VALIUM) 2 MG tablet 1/2 - 1 tab by mouth twice per day as needed  . escitalopram (LEXAPRO) 10 MG tablet Take 1 tablet (10 mg total) by mouth daily.  . ferrous sulfate 325 (65 FE) MG tablet Take 325 mg by mouth daily with breakfast.  . fish oil-omega-3 fatty acids 1000 MG capsule Take 1 g by mouth daily.   Marland Kitchen levothyroxine (SYNTHROID, LEVOTHROID) 50 MCG tablet Take 1 tablet (50 mcg total) by mouth daily.  . methocarbamol (ROBAXIN) 500 MG tablet Take 1 tablet (500 mg total) by mouth 2 (two) times daily as needed for muscle spasms.  . Nutritional Supplements (OSTEO ADVANCE) TABS Take 2 tablets by mouth daily.   Marland Kitchen omeprazole (PRILOSEC) 20 MG capsule Take 1 capsule (20 mg total) by mouth daily.  . pravastatin (PRAVACHOL) 20 MG tablet Take 1 tablet (20 mg total) by mouth daily at 6 PM.   No facility-administered encounter medications on file as of 07/13/2015.    PAST MEDICAL HISTORY:   Past Medical History  Diagnosis Date  . History of sudden visual loss   . Acute cystitis   . Bronchitis, acute   . Hypertension   . Cerebrovascular disease   . Hypothyroidism   . GERD (gastroesophageal reflux disease)   . Diverticulosis of colon   . DJD (degenerative joint disease)   . Back pain   . Transient ischemic attack   . Anxiety    . Depression   . Asthma   . COPD (chronic obstructive pulmonary disease) ([place])   . Shortness of breath     with exertion   . Pneumonia 2015    hx of x 2   . Stroke [facility])     2010 partial blind in left eye   . UTI (lower urinary tract infection)     hx of   . Carotid artery occlusion   . Hyperlipidemia   . Aortic stenosis, mild 11/18/2013  . Pulmonary hypertension, moderate to severe ([place]) 11/19/2014  . Anemia   . Fall 07/05/2015    PAST SURGICAL HISTORY:   Past Surgical History  Procedure Laterality Date  . Vaginal hysterectomy    .  Colectomy    . Tubal ligation    . Replacement total knee      right and left knee  . Cardiothoracic procedure  01/13/2009    Right   . Foot surgery      nerve cut between toes   . Endarterectomy      right carotid endartarectomy - 2010  . Total knee arthroplasty Left 11/17/2012    Procedure: TOTAL KNEE ARTHROPLASTY;  Surgeon: Gearlean Alf, MD;  Location: [facility];  Service: Orthopedics;  Laterality: Left;  . Tee without cardioversion N/A 02/08/2014    Procedure: TRANSESOPHAGEAL ECHOCARDIOGRAM (TEE);  Surgeon: Larey Dresser, MD;  Location: [facility];  Service: Cardiovascular;  Laterality: N/A;  . Cardiac catheterization  02/18/14  . Left and right heart catheterization with coronary angiogram N/A 02/18/2014    Procedure: LEFT AND RIGHT HEART CATHETERIZATION WITH CORONARY ANGIOGRAM;  Surgeon: Larey Dresser, MD;  Location: [facility];  Service: Cardiovascular;  Laterality: N/A;    SOCIAL HISTORY:   Social History   Social History  . Marital Status: Married    Spouse Name: Journalist, newspaper  . Number of Children: 3  . Years of Education: N/A   Occupational History  . Not on file.   Social History Main Topics  . Smoking status: Never Smoker   . Smokeless tobacco: Never Used  . Alcohol Use: No  . Drug Use: No  . Sexual Activity: Not on file   Other Topics Concern  . Not on file   Social History Narrative    FAMILY HISTORY:   Family  Status  Relation Status Death Age  . Father Deceased     Emphysema  . Mother Deceased     CHF  . Sister Alive     heart disease  . Brother Alive     COPD  . Sister Alive     COPD    ROS:  Admits to shortness of breath from asthma.  Admits to stress from caregiving for her husband with Alzheimer's disease.  Admits to neck pain, but states that it is significantly better since an epidural injection.  She is contemplating surgery and has a follow-up appointment with Dr. Arnoldo Morale.  A complete 10 system review of systems was obtained and was unremarkable apart from what is mentioned above.  PHYSICAL EXAMINATION:    VITALS:   Filed Vitals:   07/13/15 0920  BP: 100/64  Pulse: 85  Height: 5\' 5"  (1.651 m)  Weight: 172 lb (78.019 kg)    GEN:  The patient appears stated age and is in NAD.  There is an unpleasant odor in the room. HEENT:  Normocephalic, atraumatic.  The mucous membranes are moist. The superficial temporal arteries are without ropiness or tenderness. CV:  RRR Lungs:  CTAB.  She is visibly short of breath.  This gets worse with exertion. Neck/HEME:  There are no carotid bruits bilaterally.  Neurological examination:  Orientation: The patient is alert and oriented x3. Fund of knowledge is appropriate.  Recent and remote memory are intact.  Attention and concentration are normal.    Able to name objects and repeat phrases. Cranial nerves: There is good facial symmetry.  There is significant facial hypomimia.  Pupils are equal round and reactive to light bilaterally. Fundoscopic exam reveals clear margins bilaterally. Extraocular muscles are intact. The visual fields are full to confrontational testing. The speech is fluent and clear.  She is hypophonic.  Soft palate rises symmetrically and there is no tongue  deviation. Hearing is intact to conversational tone. Sensation: Sensation is intact to light and pinprick throughout (facial, trunk, extremities). Vibration is decreased at  the bilateral big toe. There is no extinction with double simultaneous stimulation. There is no sensory dermatomal level identified. Motor: Strength is 5/5 in the bilateral upper and lower extremities.   Shoulder shrug is equal and symmetric.  There is no pronator drift. Deep tendon reflexes: Deep tendon reflexes are 2/4 at the bilateral biceps, triceps, brachioradialis, patella and 1/4 at the bilateral achilles. Plantar responses are downgoing bilaterally.  Movement examination: Tone: There is moderate rigidity in the right upper extremity.  Tone in the left upper extremity was normal.   Abnormal movements: There is a mild right upper extremity resting tremor (she believes that she was given her levodopa at this morning before leaving the subacute nursing facility). Coordination:  There is decremation with RAM's, with any form of RAMS, including alternating supination and pronation of the forearm, hand opening and closing, finger taps, heel taps and toe taps, with the right being significantly worse than the left. Gait and Station: The patient has difficulty arising out of a deep-seated chair without the use of the hands and has difficulty even with the use of the hands. The patient's stride length is markedly decreased.  She shuffles.  She festinates.  She turns en bloc.  Ambulation is also limited by significant shortness of breath.       ASSESSMENT/PLAN:  1.  Idiopathic Parkinson's disease.  While this is just diagnosed in 06/2015, she has had documented sx's since at least 07/2013.    -We discussed the diagnosis as well as pathophysiology of the disease.  We discussed treatment options as well as prognostic indicators.  Patient education was provided.  -Greater than 50% of the 60 minute visit was spent in counseling answering questions and talking about what to expect now as well as in the future.  We talked about medication options as well as potential future surgical options.  We talked about  safety in the home.  -We decided to increase carbidopa/levodopa 25/100 to 1-1/2 tablets 3 times a day and change the dosing to 7 AM/11 AM/4 PM.  -She just went to subacute rehabilitation yesterday and the plan is to stay for 30 days.  I will write an order for physical, occupational and speech therapy if she was not already getting that. 2.  Dysphagia  -We will order a modified barium swallow. 3.  Shortness of breath  -This is going to significantly limit her ability to participate with therapy.  The patient admits that she was not given her inhalers or asthma medication prior to coming here today.  She also admits that she has not followed up with her pulmonologist, Dr. Lenna Gilford, and I encouraged her to make an appointment and do that. 4.  Follow up is anticipated in the next few months, sooner should new neurologic issues arise.

## 2015-07-14 ENCOUNTER — Encounter: Payer: Self-pay | Admitting: Internal Medicine

## 2015-07-14 ENCOUNTER — Non-Acute Institutional Stay (SKILLED_NURSING_FACILITY): Payer: Medicare Other | Admitting: Internal Medicine

## 2015-07-14 DIAGNOSIS — M4722 Other spondylosis with radiculopathy, cervical region: Secondary | ICD-10-CM | POA: Diagnosis not present

## 2015-07-14 DIAGNOSIS — R1310 Dysphagia, unspecified: Secondary | ICD-10-CM | POA: Diagnosis not present

## 2015-07-14 DIAGNOSIS — K219 Gastro-esophageal reflux disease without esophagitis: Secondary | ICD-10-CM | POA: Diagnosis not present

## 2015-07-14 DIAGNOSIS — R278 Other lack of coordination: Secondary | ICD-10-CM | POA: Diagnosis not present

## 2015-07-14 DIAGNOSIS — R259 Unspecified abnormal involuntary movements: Secondary | ICD-10-CM

## 2015-07-14 DIAGNOSIS — D638 Anemia in other chronic diseases classified elsewhere: Secondary | ICD-10-CM

## 2015-07-14 DIAGNOSIS — G20 Parkinson's disease: Secondary | ICD-10-CM | POA: Diagnosis not present

## 2015-07-14 DIAGNOSIS — E785 Hyperlipidemia, unspecified: Secondary | ICD-10-CM

## 2015-07-14 DIAGNOSIS — I10 Essential (primary) hypertension: Secondary | ICD-10-CM | POA: Diagnosis not present

## 2015-07-14 DIAGNOSIS — F329 Major depressive disorder, single episode, unspecified: Secondary | ICD-10-CM

## 2015-07-14 DIAGNOSIS — F32A Depression, unspecified: Secondary | ICD-10-CM

## 2015-07-14 DIAGNOSIS — M6281 Muscle weakness (generalized): Secondary | ICD-10-CM | POA: Diagnosis not present

## 2015-07-14 DIAGNOSIS — R531 Weakness: Secondary | ICD-10-CM

## 2015-07-14 DIAGNOSIS — M545 Low back pain: Secondary | ICD-10-CM | POA: Diagnosis not present

## 2015-07-14 DIAGNOSIS — M542 Cervicalgia: Secondary | ICD-10-CM | POA: Diagnosis not present

## 2015-07-14 DIAGNOSIS — S130XXD Traumatic rupture of cervical intervertebral disc, subsequent encounter: Secondary | ICD-10-CM | POA: Diagnosis not present

## 2015-07-14 DIAGNOSIS — E46 Unspecified protein-calorie malnutrition: Secondary | ICD-10-CM | POA: Diagnosis not present

## 2015-07-14 DIAGNOSIS — R258 Other abnormal involuntary movements: Secondary | ICD-10-CM

## 2015-07-14 DIAGNOSIS — G252 Other specified forms of tremor: Secondary | ICD-10-CM

## 2015-07-14 DIAGNOSIS — R262 Difficulty in walking, not elsewhere classified: Secondary | ICD-10-CM | POA: Diagnosis not present

## 2015-07-14 LAB — BASIC METABOLIC PANEL
BUN: 19 mg/dL (ref 4–21)
Creatinine: 1.3 mg/dL — AB (ref 0.5–1.1)
POTASSIUM: 4.5 mmol/L (ref 3.4–5.3)
SODIUM: 147 mmol/L (ref 137–147)

## 2015-07-14 LAB — CBC AND DIFFERENTIAL
HCT: 36 % (ref 36–46)
HEMOGLOBIN: 11.6 g/dL — AB (ref 12.0–16.0)
Platelets: 295 10*3/uL (ref 150–399)
WBC: 5.4 10^3/mL

## 2015-07-14 NOTE — Progress Notes (Signed)
Patient ID: [name], female   DOB: 12-03-1934, 80 y.o.   MRN: [id number]     [facility]  PCP: Cathlean Cower, MD  Code Status: Full Code   Allergies  Allergen Reactions  . Relafen [Nabumetone] Diarrhea and Other (See Comments)    GI / Urinary Bleeding  . Lipitor [Atorvastatin] Other (See Comments)    Causes memory loss  . Other Other (See Comments)    "orvail" unknown:  Causes Bloody stool  . Penicillins Hives, Itching, Swelling and Rash    Has patient had a PCN reaction causing immediate rash, facial/tongue/throat swelling, SOB or lightheadedness with hypotension: no Has patient had a PCN reaction causing severe rash involving mucus membranes or skin necrosis: unknown Has patient had a PCN reaction that required hospitalization no Has patient had a PCN reaction occurring within the last 10 years: no If all of the above answers are "NO", then may proceed with Cephalosporin use.     Chief Complaint  Patient presents with  . New Admit To SNF    New Admission      HPI:  80 y.o. patient is here for short term rehabilitation post hospital admission from 07/06/15-07/10/15 post frequent falls with new diagnosis of Parkinson's disease. She had hypovolemia with acute renal injury and deconditioning. She was seen by neurology and started on sinemet. Ct head was negative for acute intracranial findings. She received iv fluids. She has PMH of HTN, old CVA, hypothyroidism, DJD among others. She is seen in her room today. She has been seen by neurology in the office on 07/13/15 and her sinemet was increased. she is alert and oriented. She had a bowel movement yesterday. Her reflux symptom is controlled with medication.    Review of Systems:  Constitutional: Negative for fever, chills  HENT: Negative for headache, congestion, nasal discharge. Has difficulty swallowing.   Eyes: Negative for eye pain, blurred vision, double vision and discharge.  Respiratory: Negative for  cough, shortness of breath and wheezing.   Cardiovascular: Negative for chest pain, palpitations, leg swelling.  Gastrointestinal: Negative for heartburn, nausea, vomiting, abdominal pain Genitourinary: Negative for dysuria Musculoskeletal: Negative for back pain, falls in the facility Skin: Negative for itching, rash.  Neurological: Negative for dizziness, tingling, focal weakness Psychiatric/Behavioral: Negative for depression   Past Medical History  Diagnosis Date  . History of sudden visual loss   . Acute cystitis   . Bronchitis, acute   . Hypertension   . Cerebrovascular disease   . Hypothyroidism   . GERD (gastroesophageal reflux disease)   . Diverticulosis of colon   . DJD (degenerative joint disease)   . Back pain   . Transient ischemic attack   . Anxiety   . Depression   . Asthma   . COPD (chronic obstructive pulmonary disease) ([place])   . Shortness of breath     with exertion   . Pneumonia 2015    hx of x 2   . 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 UTI (lower urinary tract infection)     hx of   . Carotid artery occlusion   . Hyperlipidemia   . Aortic stenosis, mild 11/18/2013  . Pulmonary hypertension, moderate to severe ([place]) 11/19/2014  . Anemia   . Fall 07/05/2015   Past Surgical History  Procedure Laterality Date  . Vaginal hysterectomy    . Colectomy    . Tubal ligation    . Replacement  total knee      right and left knee  . Cardiothoracic procedure  01/13/2009    Right   . Foot surgery      nerve cut between toes   . Endarterectomy      right carotid endartarectomy - 2010  . Total knee arthroplasty Left 11/17/2012    Procedure: TOTAL KNEE ARTHROPLASTY;  Surgeon: Gearlean Alf, MD;  Location: [facility];  Service: Orthopedics;  Laterality: Left;  . Tee without cardioversion N/A 02/08/2014    Procedure: TRANSESOPHAGEAL ECHOCARDIOGRAM (TEE);  Surgeon: Larey Dresser, MD;  Location: [place];  Service: Cardiovascular;  Laterality: N/A;    . Cardiac catheterization  02/18/14  . Left and right heart catheterization with coronary angiogram N/A 02/18/2014    Procedure: LEFT AND RIGHT HEART CATHETERIZATION WITH CORONARY ANGIOGRAM;  Surgeon: Larey Dresser, MD;  Location: [facility] CATH LAB;  Service: Cardiovascular;  Laterality: N/A;   Social History:   reports that she has never smoked. She has never used smokeless tobacco. She reports that she does not drink alcohol or use illicit drugs.  Family History  Problem Relation Age of Onset  . Emphysema Father   . Heart disease Mother   . Hyperlipidemia Mother   . Hypertension Mother   . Coronary artery disease Sister   . Heart disease Sister   . Hypertension Sister   . Varicose Veins Sister   . AAA (abdominal aortic aneurysm) Sister   . Rheumatic fever Brother     x2  . Heart attack Brother     Medications:   Medication List       This list is accurate as of: 07/14/15 10:26 AM.  Always use your most recent med list.               ADVAIR HFA 230-21 MCG/ACT inhaler  Generic drug:  fluticasone-salmeterol  Inhale 2 puffs into the lungs 2 (two) times daily.     albuterol 108 (90 BASE) MCG/ACT inhaler  Commonly known as:  PROVENTIL HFA;VENTOLIN HFA  Inhale 2 puffs into the lungs every 6 (six) hours as needed for wheezing or shortness of breath.     amLODipine 5 MG tablet  Commonly known as:  NORVASC  Take 1 tablet (5 mg total) by mouth daily.     aspirin EC 81 MG tablet  Take 1 tablet (81 mg total) by mouth daily.     AZO CRANBERRY 250-30 MG Tabs  Take 1 capsule by mouth daily.     carbidopa-levodopa 25-100 MG tablet  Commonly known as:  SINEMET IR  1 1/2 tab by mouth three times daily 7a, 11a, 4 p for parkinson's disease     diazepam 2 MG tablet  Commonly known as:  VALIUM  1/2 - 1 tab by mouth twice per day as needed     escitalopram 10 MG tablet  Commonly known as:  LEXAPRO  Take 1 tablet (10 mg total) by mouth daily.     ferrous sulfate 325 (65 FE) MG  tablet  Take 325 mg by mouth daily with breakfast.     fish oil-omega-3 fatty acids 1000 MG capsule  Take 1 g by mouth daily.     levothyroxine 50 MCG tablet  Commonly known as:  SYNTHROID, LEVOTHROID  Take 1 tablet (50 mcg total) by mouth daily.     methocarbamol 500 MG tablet  Commonly known as:  ROBAXIN  Take 1 tablet (500 mg total) by mouth 2 (two) times daily  as needed for muscle spasms.     omeprazole 20 MG capsule  Commonly known as:  PRILOSEC  Take 1 capsule (20 mg total) by mouth daily.     OSTEO ADVANCE Tabs  Take 2 tablets by mouth daily.     pravastatin 20 MG tablet  Commonly known as:  PRAVACHOL  Take 1 tablet (20 mg total) by mouth daily at 6 PM.         Physical Exam: Filed Vitals:   07/14/15 1015  BP: 134/67  Pulse: 67  Temp: 96.9 F (36.1 C)  TempSrc: Oral  SpO2: 95%    General- elderly female, thin and frail, in no acute distress Head- normocephalic, atraumatic Nose- normal nasal mucosa, no maxillary or frontal sinus tenderness, no nasal discharge Throat- moist mucus membrane Eyes- PERRLA, EOMI, no pallor, no icterus, no discharge, normal conjunctiva, normal sclera Neck- no cervical lymphadenopathy Cardiovascular- normal s1,s2, palpable dorsalis pedis and radial pulses, no leg edema Respiratory- bilateral clear to auscultation Abdomen- bowel sounds present, soft, non tender Musculoskeletal- able to move all 4 extremities, generalized weakness, some rigidity in UE and has shuffling gait Neurological- no focal deficit, alert and oriented to person, place and time, fine tremor present Skin- warm and dry Psychiatry- normal mood and affect    Labs reviewed: Basic Metabolic Panel:  Recent Labs  07/06/15 2023 07/08/15 0515 07/09/15 0507  NA 138 142 141  K 4.5 4.4 4.1  CL 104 108 111  CO2 26 26 22   GLUCOSE 151* 94 101*  BUN 21* 15 14  CREATININE 1.76* 1.42* 1.25*  CALCIUM 9.6 9.4 8.9   Liver Function Tests:  Recent Labs   11/19/14 1538 01/19/15 1458 07/06/15 2023  AST 15 15 22   ALT 8 9 16   ALKPHOS 63 61 56  BILITOT 0.5 0.5 0.5  PROT 7.5 7.3 7.1  ALBUMIN 4.2 4.1 3.9   No results for input(s): LIPASE, AMYLASE in the last 8760 hours. No results for input(s): AMMONIA in the last 8760 hours. CBC:  Recent Labs  11/19/14 1538 01/19/15 1458  07/06/15 2023 07/08/15 0515 07/09/15 0507  WBC 6.1 6.3  < > 7.0 5.4 5.7  NEUTROABS 3.5 3.8  --  5.0  --   --   HGB 11.7* 11.7*  < > 11.6* 10.5* 9.5*  HCT 34.0* 34.4*  < > 34.8* 31.9* 28.5*  MCV 96.2 97.6  < > 98.9 100.6* 100.4*  PLT 236.0 237.0  < > 225 217 200  < > = values in this interval not displayed. Cardiac Enzymes: No results for input(s): CKTOTAL, CKMB, CKMBINDEX, TROPONINI in the last 8760 hours. BNP: Invalid input(s): POCBNP CBG: No results for input(s): GLUCAP in the last 8760 hours.  Radiological Exams: Dg Elbow Complete Right  07/06/2015  CLINICAL DATA:  Fall 2 days ago, right elbow pain/bruising EXAM: RIGHT ELBOW - COMPLETE 3+ VIEW COMPARISON:  None. FINDINGS: No fracture or dislocation is seen. The joint spaces are preserved. Visualized soft tissues are within normal limits. No displaced elbow joint fat pads to suggest an elbow joint effusion. IMPRESSION: No fracture or dislocation is seen. Electronically Signed   By: Julian Hy M.D.   On: 07/06/2015 16:51   Ct Head Wo Contrast  07/07/2015  CLINICAL DATA:  80 year old hypertensive female with frequent falls. Initial encounter. EXAM: CT HEAD WITHOUT CONTRAST TECHNIQUE: Contiguous axial images were obtained from the base of the skull through the vertex without intravenous contrast. COMPARISON:  01/27/2013 brain MR. 01/02/2013 head CT. FINDINGS:  No skull fracture or intracranial hemorrhage. Small vessel disease type changes without CT evidence of large acute infarct. Mild global atrophy without hydrocephalus. No intracranial mass lesion noted on this unenhanced exam. Orbital structures  unremarkable. Visualized paranasal sinuses, mastoid air cells and middle ear cavities are clear. Vascular calcifications. IMPRESSION: No skull fracture or intracranial hemorrhage. Small vessel disease type changes without CT evidence of large acute infarct. Electronically Signed   By: Genia Del M.D.   On: 07/07/2015 07:19   Mr Hip Right Wo Contrast  07/07/2015  CLINICAL DATA:  Fall on 07/06/2015 with bruising of the right hip and right hip pain. EXAM: MR OF THE RIGHT HIP WITHOUT CONTRAST TECHNIQUE: Multiplanar, multisequence MR imaging was performed. No intravenous contrast was administered. COMPARISON:  07/06/2015 radiographs FINDINGS: Bones: Degenerative endplate findings in the lower lumbar spine. No fracture observed. No significant abnormal osseous edema in the hips or bony pelvis. Articular cartilage and labrum Articular cartilage: Limited assessment due to motion artifact, but not notably thinned. Labrum: Limited assessment due to motion artifact. No obvious tear. Joint or bursal effusion Joint effusion: Upper normal amount of fluid in both hip joints without overt effusion. Bursae:  No regional bursitis Muscles and tendons Muscles and tendons:  Unremarkable Other findings Miscellaneous:  Uterus absent. IMPRESSION: 1. No significant acute findings to explain the patient's right hip pain. 2. Lower lumbar spondylosis and degenerative disc disease. 3. No significant change from the on-call preliminary report. Electronically Signed   By: Van Clines M.D.   On: 07/07/2015 07:36   Dg Hip Unilat  With Pelvis 2-3 Views Right  07/06/2015  CLINICAL DATA:  Fall 2 days ago EXAM: DG HIP (WITH OR WITHOUT PELVIS) 2-3V RIGHT COMPARISON:  None FINDINGS: There is no evidence of hip fracture or dislocation. There is no evidence of arthropathy or other focal bone abnormality. IMPRESSION: 1. No fracture. 2. If there is high clinical suspicion for occult fracture or the patient refuses to weightbear, consider  further evaluation with MRI. Although CT is expeditious, evidence is lacking regarding accuracy of CT over plain film radiography. Electronically Signed   By: Kerby Moors M.D.   On: 07/06/2015 16:53     Assessment/Plan  Generalized weakness Will have her work with physical therapy and occupational therapy team to help with gait training and muscle strengthening exercises.fall precautions. Skin care. Encourage to be out of bed.   Parkinson's disease Continue sinemet 25/100 one and a half a tab tid, fall precautions, to work with therapy team and has f/u with neurology. Continue robaxin 500 mg bid prn for muscle rigidity.   Tremors With parkinson's disease. Continue valium 2 mg 1/2 to 1 tab bid prn  Protein calorie malnutrition Monitor po intake and weight, continue nutritional supplement. Get dietary consult. Weekly weight. Add procel 1 scoop bid for now  Dysphagia Has MBS pending 08/05/15, to follow with SLP team, aspiration precautions  HTN Stable, continue amlodipine 5 mg daily  HLD Continue pravastatin 20 mg daily  gerd Continue omeprazole 20 mg daily, symptom controlled  Depression Continue lexapro 10 mg daily  Anemia of chronic disease Continue feso4 325 mg daily, monitor cbc    Goals of care: short term rehabilitation   Labs/tests ordered: cbc, cmp 07/18/15  Family/ staff Communication: reviewed care plan with patient and nursing supervisor    Blanchie Serve, MD  [place] [phone] (Monday-Friday 8 am - 5 pm) [phone] (afterhours)

## 2015-07-15 DIAGNOSIS — S130XXD Traumatic rupture of cervical intervertebral disc, subsequent encounter: Secondary | ICD-10-CM | POA: Diagnosis not present

## 2015-07-15 DIAGNOSIS — R262 Difficulty in walking, not elsewhere classified: Secondary | ICD-10-CM | POA: Diagnosis not present

## 2015-07-15 DIAGNOSIS — M6281 Muscle weakness (generalized): Secondary | ICD-10-CM | POA: Diagnosis not present

## 2015-07-15 DIAGNOSIS — G20 Parkinson's disease: Secondary | ICD-10-CM | POA: Diagnosis not present

## 2015-07-15 DIAGNOSIS — R278 Other lack of coordination: Secondary | ICD-10-CM | POA: Diagnosis not present

## 2015-07-15 DIAGNOSIS — M545 Low back pain: Secondary | ICD-10-CM | POA: Diagnosis not present

## 2015-07-16 DIAGNOSIS — M545 Low back pain: Secondary | ICD-10-CM | POA: Diagnosis not present

## 2015-07-16 DIAGNOSIS — R262 Difficulty in walking, not elsewhere classified: Secondary | ICD-10-CM | POA: Diagnosis not present

## 2015-07-16 DIAGNOSIS — R278 Other lack of coordination: Secondary | ICD-10-CM | POA: Diagnosis not present

## 2015-07-16 DIAGNOSIS — M6281 Muscle weakness (generalized): Secondary | ICD-10-CM | POA: Diagnosis not present

## 2015-07-16 DIAGNOSIS — G20 Parkinson's disease: Secondary | ICD-10-CM | POA: Diagnosis not present

## 2015-07-16 DIAGNOSIS — S130XXD Traumatic rupture of cervical intervertebral disc, subsequent encounter: Secondary | ICD-10-CM | POA: Diagnosis not present

## 2015-07-18 DIAGNOSIS — R262 Difficulty in walking, not elsewhere classified: Secondary | ICD-10-CM | POA: Diagnosis not present

## 2015-07-18 DIAGNOSIS — G20 Parkinson's disease: Secondary | ICD-10-CM | POA: Diagnosis not present

## 2015-07-18 DIAGNOSIS — R278 Other lack of coordination: Secondary | ICD-10-CM | POA: Diagnosis not present

## 2015-07-18 DIAGNOSIS — S130XXD Traumatic rupture of cervical intervertebral disc, subsequent encounter: Secondary | ICD-10-CM | POA: Diagnosis not present

## 2015-07-18 DIAGNOSIS — E440 Moderate protein-calorie malnutrition: Secondary | ICD-10-CM | POA: Diagnosis not present

## 2015-07-18 DIAGNOSIS — M545 Low back pain: Secondary | ICD-10-CM | POA: Diagnosis not present

## 2015-07-18 DIAGNOSIS — M6281 Muscle weakness (generalized): Secondary | ICD-10-CM | POA: Diagnosis not present

## 2015-07-19 DIAGNOSIS — M545 Low back pain: Secondary | ICD-10-CM | POA: Diagnosis not present

## 2015-07-19 DIAGNOSIS — R262 Difficulty in walking, not elsewhere classified: Secondary | ICD-10-CM | POA: Diagnosis not present

## 2015-07-19 DIAGNOSIS — G20 Parkinson's disease: Secondary | ICD-10-CM | POA: Diagnosis not present

## 2015-07-19 DIAGNOSIS — R278 Other lack of coordination: Secondary | ICD-10-CM | POA: Diagnosis not present

## 2015-07-19 DIAGNOSIS — M6281 Muscle weakness (generalized): Secondary | ICD-10-CM | POA: Diagnosis not present

## 2015-07-19 DIAGNOSIS — S130XXD Traumatic rupture of cervical intervertebral disc, subsequent encounter: Secondary | ICD-10-CM | POA: Diagnosis not present

## 2015-07-20 DIAGNOSIS — R262 Difficulty in walking, not elsewhere classified: Secondary | ICD-10-CM | POA: Diagnosis not present

## 2015-07-20 DIAGNOSIS — G20 Parkinson's disease: Secondary | ICD-10-CM | POA: Diagnosis not present

## 2015-07-20 DIAGNOSIS — R278 Other lack of coordination: Secondary | ICD-10-CM | POA: Diagnosis not present

## 2015-07-20 DIAGNOSIS — S130XXD Traumatic rupture of cervical intervertebral disc, subsequent encounter: Secondary | ICD-10-CM | POA: Diagnosis not present

## 2015-07-20 DIAGNOSIS — M545 Low back pain: Secondary | ICD-10-CM | POA: Diagnosis not present

## 2015-07-20 DIAGNOSIS — M6281 Muscle weakness (generalized): Secondary | ICD-10-CM | POA: Diagnosis not present

## 2015-07-21 ENCOUNTER — Ambulatory Visit: Payer: Medicare Other | Admitting: Internal Medicine

## 2015-07-21 DIAGNOSIS — M545 Low back pain: Secondary | ICD-10-CM | POA: Diagnosis not present

## 2015-07-21 DIAGNOSIS — M6281 Muscle weakness (generalized): Secondary | ICD-10-CM | POA: Diagnosis not present

## 2015-07-21 DIAGNOSIS — G20 Parkinson's disease: Secondary | ICD-10-CM | POA: Diagnosis not present

## 2015-07-21 DIAGNOSIS — R278 Other lack of coordination: Secondary | ICD-10-CM | POA: Diagnosis not present

## 2015-07-21 DIAGNOSIS — S130XXD Traumatic rupture of cervical intervertebral disc, subsequent encounter: Secondary | ICD-10-CM | POA: Diagnosis not present

## 2015-07-21 DIAGNOSIS — R262 Difficulty in walking, not elsewhere classified: Secondary | ICD-10-CM | POA: Diagnosis not present

## 2015-07-22 DIAGNOSIS — M6281 Muscle weakness (generalized): Secondary | ICD-10-CM | POA: Diagnosis not present

## 2015-07-22 DIAGNOSIS — R278 Other lack of coordination: Secondary | ICD-10-CM | POA: Diagnosis not present

## 2015-07-22 DIAGNOSIS — S130XXD Traumatic rupture of cervical intervertebral disc, subsequent encounter: Secondary | ICD-10-CM | POA: Diagnosis not present

## 2015-07-22 DIAGNOSIS — M545 Low back pain: Secondary | ICD-10-CM | POA: Diagnosis not present

## 2015-07-22 DIAGNOSIS — R262 Difficulty in walking, not elsewhere classified: Secondary | ICD-10-CM | POA: Diagnosis not present

## 2015-07-22 DIAGNOSIS — G20 Parkinson's disease: Secondary | ICD-10-CM | POA: Diagnosis not present

## 2015-07-25 DIAGNOSIS — R278 Other lack of coordination: Secondary | ICD-10-CM | POA: Diagnosis not present

## 2015-07-25 DIAGNOSIS — M6281 Muscle weakness (generalized): Secondary | ICD-10-CM | POA: Diagnosis not present

## 2015-07-25 DIAGNOSIS — S130XXD Traumatic rupture of cervical intervertebral disc, subsequent encounter: Secondary | ICD-10-CM | POA: Diagnosis not present

## 2015-07-25 DIAGNOSIS — R262 Difficulty in walking, not elsewhere classified: Secondary | ICD-10-CM | POA: Diagnosis not present

## 2015-07-25 DIAGNOSIS — G20 Parkinson's disease: Secondary | ICD-10-CM | POA: Diagnosis not present

## 2015-07-25 DIAGNOSIS — M545 Low back pain: Secondary | ICD-10-CM | POA: Diagnosis not present

## 2015-07-26 DIAGNOSIS — S130XXD Traumatic rupture of cervical intervertebral disc, subsequent encounter: Secondary | ICD-10-CM | POA: Diagnosis not present

## 2015-07-26 DIAGNOSIS — M545 Low back pain: Secondary | ICD-10-CM | POA: Diagnosis not present

## 2015-07-26 DIAGNOSIS — G20 Parkinson's disease: Secondary | ICD-10-CM | POA: Diagnosis not present

## 2015-07-26 DIAGNOSIS — R262 Difficulty in walking, not elsewhere classified: Secondary | ICD-10-CM | POA: Diagnosis not present

## 2015-07-26 DIAGNOSIS — R278 Other lack of coordination: Secondary | ICD-10-CM | POA: Diagnosis not present

## 2015-07-26 DIAGNOSIS — M6281 Muscle weakness (generalized): Secondary | ICD-10-CM | POA: Diagnosis not present

## 2015-07-27 DIAGNOSIS — R278 Other lack of coordination: Secondary | ICD-10-CM | POA: Diagnosis not present

## 2015-07-27 DIAGNOSIS — S130XXD Traumatic rupture of cervical intervertebral disc, subsequent encounter: Secondary | ICD-10-CM | POA: Diagnosis not present

## 2015-07-27 DIAGNOSIS — M6281 Muscle weakness (generalized): Secondary | ICD-10-CM | POA: Diagnosis not present

## 2015-07-27 DIAGNOSIS — G20 Parkinson's disease: Secondary | ICD-10-CM | POA: Diagnosis not present

## 2015-07-27 DIAGNOSIS — M545 Low back pain: Secondary | ICD-10-CM | POA: Diagnosis not present

## 2015-07-27 DIAGNOSIS — R262 Difficulty in walking, not elsewhere classified: Secondary | ICD-10-CM | POA: Diagnosis not present

## 2015-07-28 DIAGNOSIS — R278 Other lack of coordination: Secondary | ICD-10-CM | POA: Diagnosis not present

## 2015-07-28 DIAGNOSIS — M545 Low back pain: Secondary | ICD-10-CM | POA: Diagnosis not present

## 2015-07-28 DIAGNOSIS — S130XXD Traumatic rupture of cervical intervertebral disc, subsequent encounter: Secondary | ICD-10-CM | POA: Diagnosis not present

## 2015-07-28 DIAGNOSIS — M6281 Muscle weakness (generalized): Secondary | ICD-10-CM | POA: Diagnosis not present

## 2015-07-28 DIAGNOSIS — G20 Parkinson's disease: Secondary | ICD-10-CM | POA: Diagnosis not present

## 2015-07-28 DIAGNOSIS — R262 Difficulty in walking, not elsewhere classified: Secondary | ICD-10-CM | POA: Diagnosis not present

## 2015-07-29 DIAGNOSIS — G20 Parkinson's disease: Secondary | ICD-10-CM | POA: Diagnosis not present

## 2015-07-29 DIAGNOSIS — R262 Difficulty in walking, not elsewhere classified: Secondary | ICD-10-CM | POA: Diagnosis not present

## 2015-07-29 DIAGNOSIS — M545 Low back pain: Secondary | ICD-10-CM | POA: Diagnosis not present

## 2015-07-29 DIAGNOSIS — S130XXD Traumatic rupture of cervical intervertebral disc, subsequent encounter: Secondary | ICD-10-CM | POA: Diagnosis not present

## 2015-07-29 DIAGNOSIS — M6281 Muscle weakness (generalized): Secondary | ICD-10-CM | POA: Diagnosis not present

## 2015-07-29 DIAGNOSIS — R278 Other lack of coordination: Secondary | ICD-10-CM | POA: Diagnosis not present

## 2015-08-01 DIAGNOSIS — G20 Parkinson's disease: Secondary | ICD-10-CM | POA: Diagnosis not present

## 2015-08-01 DIAGNOSIS — Z9181 History of falling: Secondary | ICD-10-CM | POA: Diagnosis not present

## 2015-08-01 DIAGNOSIS — M545 Low back pain: Secondary | ICD-10-CM | POA: Diagnosis not present

## 2015-08-01 DIAGNOSIS — R262 Difficulty in walking, not elsewhere classified: Secondary | ICD-10-CM | POA: Diagnosis not present

## 2015-08-01 DIAGNOSIS — M6281 Muscle weakness (generalized): Secondary | ICD-10-CM | POA: Diagnosis not present

## 2015-08-01 DIAGNOSIS — S130XXD Traumatic rupture of cervical intervertebral disc, subsequent encounter: Secondary | ICD-10-CM | POA: Diagnosis not present

## 2015-08-01 DIAGNOSIS — R278 Other lack of coordination: Secondary | ICD-10-CM | POA: Diagnosis not present

## 2015-08-02 DIAGNOSIS — M545 Low back pain: Secondary | ICD-10-CM | POA: Diagnosis not present

## 2015-08-02 DIAGNOSIS — R278 Other lack of coordination: Secondary | ICD-10-CM | POA: Diagnosis not present

## 2015-08-02 DIAGNOSIS — Z9181 History of falling: Secondary | ICD-10-CM | POA: Diagnosis not present

## 2015-08-02 DIAGNOSIS — R262 Difficulty in walking, not elsewhere classified: Secondary | ICD-10-CM | POA: Diagnosis not present

## 2015-08-02 DIAGNOSIS — G20 Parkinson's disease: Secondary | ICD-10-CM | POA: Diagnosis not present

## 2015-08-02 DIAGNOSIS — M6281 Muscle weakness (generalized): Secondary | ICD-10-CM | POA: Diagnosis not present

## 2015-08-03 DIAGNOSIS — R262 Difficulty in walking, not elsewhere classified: Secondary | ICD-10-CM | POA: Diagnosis not present

## 2015-08-03 DIAGNOSIS — R278 Other lack of coordination: Secondary | ICD-10-CM | POA: Diagnosis not present

## 2015-08-03 DIAGNOSIS — G20 Parkinson's disease: Secondary | ICD-10-CM | POA: Diagnosis not present

## 2015-08-03 DIAGNOSIS — Z9181 History of falling: Secondary | ICD-10-CM | POA: Diagnosis not present

## 2015-08-03 DIAGNOSIS — M6281 Muscle weakness (generalized): Secondary | ICD-10-CM | POA: Diagnosis not present

## 2015-08-03 DIAGNOSIS — M545 Low back pain: Secondary | ICD-10-CM | POA: Diagnosis not present

## 2015-08-04 DIAGNOSIS — M545 Low back pain: Secondary | ICD-10-CM | POA: Diagnosis not present

## 2015-08-04 DIAGNOSIS — Z9181 History of falling: Secondary | ICD-10-CM | POA: Diagnosis not present

## 2015-08-04 DIAGNOSIS — R262 Difficulty in walking, not elsewhere classified: Secondary | ICD-10-CM | POA: Diagnosis not present

## 2015-08-04 DIAGNOSIS — R278 Other lack of coordination: Secondary | ICD-10-CM | POA: Diagnosis not present

## 2015-08-04 DIAGNOSIS — G20 Parkinson's disease: Secondary | ICD-10-CM | POA: Diagnosis not present

## 2015-08-04 DIAGNOSIS — M6281 Muscle weakness (generalized): Secondary | ICD-10-CM | POA: Diagnosis not present

## 2015-08-05 ENCOUNTER — Ambulatory Visit (HOSPITAL_COMMUNITY)
Admission: RE | Admit: 2015-08-05 | Discharge: 2015-08-05 | Disposition: A | Discharge status: Home / Self Care | Payer: Medicare Other | Source: Ambulatory Visit | Attending: Neurology | Admitting: Neurology

## 2015-08-05 DIAGNOSIS — R4702 Dysphasia: Secondary | ICD-10-CM | POA: Diagnosis not present

## 2015-08-05 DIAGNOSIS — M545 Low back pain: Secondary | ICD-10-CM | POA: Diagnosis not present

## 2015-08-05 DIAGNOSIS — R1319 Other dysphagia: Secondary | ICD-10-CM | POA: Insufficient documentation

## 2015-08-05 DIAGNOSIS — R262 Difficulty in walking, not elsewhere classified: Secondary | ICD-10-CM | POA: Diagnosis not present

## 2015-08-05 DIAGNOSIS — G20 Parkinson's disease: Secondary | ICD-10-CM | POA: Diagnosis not present

## 2015-08-05 DIAGNOSIS — R1310 Dysphagia, unspecified: Secondary | ICD-10-CM

## 2015-08-05 DIAGNOSIS — Z9181 History of falling: Secondary | ICD-10-CM | POA: Diagnosis not present

## 2015-08-05 DIAGNOSIS — M6281 Muscle weakness (generalized): Secondary | ICD-10-CM | POA: Diagnosis not present

## 2015-08-05 DIAGNOSIS — R0602 Shortness of breath: Secondary | ICD-10-CM | POA: Insufficient documentation

## 2015-08-05 DIAGNOSIS — R278 Other lack of coordination: Secondary | ICD-10-CM | POA: Diagnosis not present

## 2015-08-06 DIAGNOSIS — M545 Low back pain: Secondary | ICD-10-CM | POA: Diagnosis not present

## 2015-08-06 DIAGNOSIS — R278 Other lack of coordination: Secondary | ICD-10-CM | POA: Diagnosis not present

## 2015-08-06 DIAGNOSIS — Z9181 History of falling: Secondary | ICD-10-CM | POA: Diagnosis not present

## 2015-08-06 DIAGNOSIS — G20 Parkinson's disease: Secondary | ICD-10-CM | POA: Diagnosis not present

## 2015-08-06 DIAGNOSIS — M6281 Muscle weakness (generalized): Secondary | ICD-10-CM | POA: Diagnosis not present

## 2015-08-06 DIAGNOSIS — R262 Difficulty in walking, not elsewhere classified: Secondary | ICD-10-CM | POA: Diagnosis not present

## 2015-08-08 DIAGNOSIS — Z9181 History of falling: Secondary | ICD-10-CM | POA: Diagnosis not present

## 2015-08-08 DIAGNOSIS — G20 Parkinson's disease: Secondary | ICD-10-CM | POA: Diagnosis not present

## 2015-08-08 DIAGNOSIS — R278 Other lack of coordination: Secondary | ICD-10-CM | POA: Diagnosis not present

## 2015-08-08 DIAGNOSIS — M6281 Muscle weakness (generalized): Secondary | ICD-10-CM | POA: Diagnosis not present

## 2015-08-08 DIAGNOSIS — R262 Difficulty in walking, not elsewhere classified: Secondary | ICD-10-CM | POA: Diagnosis not present

## 2015-08-08 DIAGNOSIS — M545 Low back pain: Secondary | ICD-10-CM | POA: Diagnosis not present

## 2015-08-09 DIAGNOSIS — M545 Low back pain: Secondary | ICD-10-CM | POA: Diagnosis not present

## 2015-08-09 DIAGNOSIS — R278 Other lack of coordination: Secondary | ICD-10-CM | POA: Diagnosis not present

## 2015-08-09 DIAGNOSIS — G20 Parkinson's disease: Secondary | ICD-10-CM | POA: Diagnosis not present

## 2015-08-09 DIAGNOSIS — R262 Difficulty in walking, not elsewhere classified: Secondary | ICD-10-CM | POA: Diagnosis not present

## 2015-08-09 DIAGNOSIS — Z9181 History of falling: Secondary | ICD-10-CM | POA: Diagnosis not present

## 2015-08-09 DIAGNOSIS — M6281 Muscle weakness (generalized): Secondary | ICD-10-CM | POA: Diagnosis not present

## 2015-08-10 DIAGNOSIS — R278 Other lack of coordination: Secondary | ICD-10-CM | POA: Diagnosis not present

## 2015-08-10 DIAGNOSIS — G20 Parkinson's disease: Secondary | ICD-10-CM | POA: Diagnosis not present

## 2015-08-10 DIAGNOSIS — R262 Difficulty in walking, not elsewhere classified: Secondary | ICD-10-CM | POA: Diagnosis not present

## 2015-08-10 DIAGNOSIS — Z9181 History of falling: Secondary | ICD-10-CM | POA: Diagnosis not present

## 2015-08-10 DIAGNOSIS — M545 Low back pain: Secondary | ICD-10-CM | POA: Diagnosis not present

## 2015-08-10 DIAGNOSIS — M6281 Muscle weakness (generalized): Secondary | ICD-10-CM | POA: Diagnosis not present

## 2015-08-11 DIAGNOSIS — M545 Low back pain: Secondary | ICD-10-CM | POA: Diagnosis not present

## 2015-08-11 DIAGNOSIS — M6281 Muscle weakness (generalized): Secondary | ICD-10-CM | POA: Diagnosis not present

## 2015-08-11 DIAGNOSIS — Z9181 History of falling: Secondary | ICD-10-CM | POA: Diagnosis not present

## 2015-08-11 DIAGNOSIS — G20 Parkinson's disease: Secondary | ICD-10-CM | POA: Diagnosis not present

## 2015-08-11 DIAGNOSIS — R278 Other lack of coordination: Secondary | ICD-10-CM | POA: Diagnosis not present

## 2015-08-11 DIAGNOSIS — R262 Difficulty in walking, not elsewhere classified: Secondary | ICD-10-CM | POA: Diagnosis not present

## 2015-08-12 ENCOUNTER — Non-Acute Institutional Stay (SKILLED_NURSING_FACILITY): Payer: Medicare Other | Admitting: Nurse Practitioner

## 2015-08-12 DIAGNOSIS — I10 Essential (primary) hypertension: Secondary | ICD-10-CM

## 2015-08-12 DIAGNOSIS — M545 Low back pain: Secondary | ICD-10-CM | POA: Diagnosis not present

## 2015-08-12 DIAGNOSIS — G20 Parkinson's disease: Secondary | ICD-10-CM

## 2015-08-12 DIAGNOSIS — Z9181 History of falling: Secondary | ICD-10-CM | POA: Diagnosis not present

## 2015-08-12 DIAGNOSIS — M6281 Muscle weakness (generalized): Secondary | ICD-10-CM | POA: Diagnosis not present

## 2015-08-12 DIAGNOSIS — R1310 Dysphagia, unspecified: Secondary | ICD-10-CM | POA: Diagnosis not present

## 2015-08-12 DIAGNOSIS — K219 Gastro-esophageal reflux disease without esophagitis: Secondary | ICD-10-CM | POA: Diagnosis not present

## 2015-08-12 DIAGNOSIS — R262 Difficulty in walking, not elsewhere classified: Secondary | ICD-10-CM | POA: Diagnosis not present

## 2015-08-12 DIAGNOSIS — R278 Other lack of coordination: Secondary | ICD-10-CM | POA: Diagnosis not present

## 2015-08-12 DIAGNOSIS — E785 Hyperlipidemia, unspecified: Secondary | ICD-10-CM

## 2015-08-12 DIAGNOSIS — D638 Anemia in other chronic diseases classified elsewhere: Secondary | ICD-10-CM | POA: Diagnosis not present

## 2015-08-12 DIAGNOSIS — E46 Unspecified protein-calorie malnutrition: Secondary | ICD-10-CM | POA: Diagnosis not present

## 2015-08-12 NOTE — Progress Notes (Signed)
Patient ID: [name], female   DOB: 02/28/1935, 81 y.o.   MRN: [id number]    Nursing Home Location:  [place] of Service: SNF (31)  PCP: Cathlean Cower, MD  Allergies  Allergen Reactions  . Relafen [Nabumetone] Diarrhea and Other (See Comments)    GI / Urinary Bleeding  . Lipitor [Atorvastatin] Other (See Comments)    Causes memory loss  . Other Other (See Comments)    "orvail" unknown:  Causes Bloody stool  . Penicillins Hives, Itching, Swelling and Rash    Has patient had a PCN reaction causing immediate rash, facial/tongue/throat swelling, SOB or lightheadedness with hypotension: no Has patient had a PCN reaction causing severe rash involving mucus membranes or skin necrosis: unknown Has patient had a PCN reaction that required hospitalization no Has patient had a PCN reaction occurring within the last 10 years: no If all of the above answers are "NO", then may proceed with Cephalosporin use.     Chief Complaint  Patient presents with  . Discharge Note    HPI:  Patient is a 81 y.o. female seen today at [facility] for discharge home. Pt with PMH of HTN, old CVA, hypothyroidism, DJD among others.Pt at [facility] for short term rehabilitation after hospitalization from 07/06/15-07/10/15 post frequent falls with new diagnosis of Parkinson's disease. She had hypovolemia with acute renal injury and deconditioning. She was seen by neurology and on sinemet. Patient currently doing well with therapy, now stable to discharge home with home health.  Review of Systems:  Review of Systems  Constitutional: Negative for activity change, appetite change, fatigue and unexpected weight change.  HENT: Negative for congestion and hearing loss.   Eyes: Negative.   Respiratory: Negative for cough and shortness of breath.   Cardiovascular: Negative for chest pain, palpitations and leg swelling.  Gastrointestinal: Negative for abdominal pain,  diarrhea and constipation.  Genitourinary: Negative for dysuria and difficulty urinating.  Musculoskeletal: Negative for myalgias and arthralgias.  Skin: Negative for color change and wound.  Neurological: Positive for tremors. Negative for dizziness and weakness.  Psychiatric/Behavioral: Negative for behavioral problems, confusion and agitation.    Past Medical History  Diagnosis Date  . History of sudden visual loss   . Acute cystitis   . Bronchitis, acute   . Hypertension   . Cerebrovascular disease   . Hypothyroidism   . GERD (gastroesophageal reflux disease)   . Diverticulosis of colon   . DJD (degenerative joint disease)   . Back pain   . Transient ischemic attack   . Anxiety   . Depression   . Asthma   . COPD (chronic obstructive pulmonary disease) ([place])   . Shortness of breath     with exertion   . Pneumonia 2015    hx of x 2   . 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 UTI (lower urinary tract infection)     hx of   . Carotid artery occlusion   . Hyperlipidemia   . Aortic stenosis, mild 11/18/2013  . Pulmonary hypertension, moderate to severe ([place]) 11/19/2014  . Anemia   . Fall 07/05/2015   Past Surgical History  Procedure Laterality Date  . Vaginal hysterectomy    . Colectomy    . Tubal ligation    . Replacement total knee      right and left knee  . Cardiothoracic procedure  01/13/2009  Right   . Foot surgery      nerve cut between toes   . Endarterectomy      right carotid endartarectomy - 2010  . Total knee arthroplasty Left 11/17/2012    Procedure: TOTAL KNEE ARTHROPLASTY;  Surgeon: Gearlean Alf, MD;  Location: [facility];  Service: Orthopedics;  Laterality: Left;  . Tee without cardioversion N/A 02/08/2014    Procedure: TRANSESOPHAGEAL ECHOCARDIOGRAM (TEE);  Surgeon: Larey Dresser, MD;  Location: [place];  Service: Cardiovascular;  Laterality: N/A;  . Cardiac catheterization  02/18/14  . Left and right heart catheterization with  coronary angiogram N/A 02/18/2014    Procedure: LEFT AND RIGHT HEART CATHETERIZATION WITH CORONARY ANGIOGRAM;  Surgeon: Larey Dresser, MD;  Location: [facility] CATH LAB;  Service: Cardiovascular;  Laterality: N/A;   Social History:   reports that she has never smoked. She has never used smokeless tobacco. She reports that she does not drink alcohol or use illicit drugs.  Family History  Problem Relation Age of Onset  . Emphysema Father   . Heart disease Mother   . Hyperlipidemia Mother   . Hypertension Mother   . Coronary artery disease Sister   . Heart disease Sister   . Hypertension Sister   . Varicose Veins Sister   . AAA (abdominal aortic aneurysm) Sister   . Rheumatic fever Brother     x2  . Heart attack Brother     Medications: Patient's Medications  New Prescriptions   No medications on file  Previous Medications   ADVAIR HFA 230-21 MCG/ACT INHALER    Inhale 2 puffs into the lungs 2 (two) times daily.   ALBUTEROL (PROVENTIL HFA;VENTOLIN HFA) 108 (90 BASE) MCG/ACT INHALER    Inhale 2 puffs into the lungs every 6 (six) hours as needed for wheezing or shortness of breath.   AMLODIPINE (NORVASC) 5 MG TABLET    Take 1 tablet (5 mg total) by mouth daily.   ASPIRIN EC 81 MG TABLET    Take 1 tablet (81 mg total) by mouth daily.   CARBIDOPA-LEVODOPA (SINEMET IR) 25-100 MG TABLET    1 1/2 tab by mouth three times daily 7a, 11a, 4 p for parkinson's disease   CRANBERRY-VITAMIN C-PROBIOTIC (AZO CRANBERRY) 250-30 MG TABS    Take 1 capsule by mouth daily.   DIAZEPAM (VALIUM) 2 MG TABLET    1/2 - 1 tab by mouth twice per day as needed   ESCITALOPRAM (LEXAPRO) 10 MG TABLET    Take 1 tablet (10 mg total) by mouth daily.   FERROUS SULFATE 325 (65 FE) MG TABLET    Take 325 mg by mouth daily with breakfast.   FISH OIL-OMEGA-3 FATTY ACIDS 1000 MG CAPSULE    Take 1 g by mouth daily.    LEVOTHYROXINE (SYNTHROID, LEVOTHROID) 50 MCG TABLET    Take 1 tablet (50 mcg total) by mouth daily.   METHOCARBAMOL  (ROBAXIN) 500 MG TABLET    Take 1 tablet (500 mg total) by mouth 2 (two) times daily as needed for muscle spasms.   NUTRITIONAL SUPPLEMENTS (OSTEO ADVANCE) TABS    Take 2 tablets by mouth daily.    OMEPRAZOLE (PRILOSEC) 20 MG CAPSULE    Take 1 capsule (20 mg total) by mouth daily.   PRAVASTATIN (PRAVACHOL) 20 MG TABLET    Take 1 tablet (20 mg total) by mouth daily at 6 PM.  Modified Medications   No medications on file  Discontinued Medications   No medications on file  Physical Exam: Filed Vitals:   08/12/15 1421  BP: 124/54  Pulse: 66  Temp: 99 F (37.2 C)  Resp: 20  Weight: 171 lb (77.565 kg)    Physical Exam  Constitutional: She appears well-developed and well-nourished. No distress.  HENT:  Head: Normocephalic and atraumatic.  Mouth/Throat: Oropharynx is clear and moist. No oropharyngeal exudate.  Eyes: Conjunctivae are normal. Pupils are equal, round, and reactive to light.  Neck: Normal range of motion. Neck supple.  Cardiovascular: Normal rate, regular rhythm and normal heart sounds.   Pulmonary/Chest: Effort normal and breath sounds normal.  Abdominal: Soft. Bowel sounds are normal.  Musculoskeletal: She exhibits no edema or tenderness.  Neurological: She is alert.  Mild rigidity in upper extremities and has shuffling gait Fine tremor  Skin: Skin is warm and dry. She is not diaphoretic.  Psychiatric: She has a normal mood and affect.    Labs reviewed: Basic Metabolic Panel:  Recent Labs  07/06/15 2023 07/08/15 0515 07/09/15 0507 07/14/15  NA 138 142 141 147  K 4.5 4.4 4.1 4.5  CL 104 108 111  --   CO2 26 26 22   --   GLUCOSE 151* 94 101*  --   BUN 21* 15 14 19   CREATININE 1.76* 1.42* 1.25* 1.3*  CALCIUM 9.6 9.4 8.9  --    Liver Function Tests:  Recent Labs  11/19/14 1538 01/19/15 1458 07/06/15 2023  AST 15 15 22   ALT 8 9 16   ALKPHOS 63 61 56  BILITOT 0.5 0.5 0.5  PROT 7.5 7.3 7.1  ALBUMIN 4.2 4.1 3.9   No results for input(s): LIPASE,  AMYLASE in the last 8760 hours. No results for input(s): AMMONIA in the last 8760 hours. CBC:  Recent Labs  11/19/14 1538 01/19/15 1458  07/06/15 2023 07/08/15 0515 07/09/15 0507 07/14/15  WBC 6.1 6.3  < > 7.0 5.4 5.7 5.4  NEUTROABS 3.5 3.8  --  5.0  --   --   --   HGB 11.7* 11.7*  < > 11.6* 10.5* 9.5* 11.6*  HCT 34.0* 34.4*  < > 34.8* 31.9* 28.5* 36  MCV 96.2 97.6  < > 98.9 100.6* 100.4*  --   PLT 236.0 237.0  < > 225 217 200 295  < > = values in this interval not displayed. TSH:  Recent Labs  11/19/14 1538 01/19/15 1458 07/08/15 0515  TSH 0.05* 1.48 0.628   A1C: No results found for: HGBA1C Lipid Panel:  Recent Labs  12/06/14 1421  CHOL 196  HDL 70.80  LDLCALC 104*  TRIG 108.0  CHOLHDL 3    Assessment/Plan  1. Parkinson disease ([place]) Stable, Followed by neurology, Continue sinemet. Continue robaxin 500 mg bid prn for muscle rigidity.   2. Protein-calorie malnutrition ([place]) Cont nutritional supplement, will need to be followed outpatient  3. Dysphagia Stable, has swallow evaluation done. No restrictions noted at this time.   4. Anemia of chronic disease Stable, conts on iron daily  5. Essential hypertension, benign Stable conts on norvasc 5 mg daily  6. Hyperlipidemia conts on pravastatin 20 mg daily   7. Gastroesophageal reflux disease, esophagitis presence not specified Stable, conts on omeprazole 20 mg daily  pt is stable for discharge-will need PT/OT per home health. No DME needed. Pt with new RX sent to facility so will DC with medication, Rx written for medications she did not just have refilled.  will need to follow up with PCP within 2 weeks.    Carlos American. Dewaine Oats,  [facility] [phone] 8 am - 5 pm) [phone] (after hours)

## 2015-08-15 DIAGNOSIS — I10 Essential (primary) hypertension: Secondary | ICD-10-CM | POA: Diagnosis not present

## 2015-08-15 DIAGNOSIS — R2689 Other abnormalities of gait and mobility: Secondary | ICD-10-CM | POA: Diagnosis not present

## 2015-08-15 DIAGNOSIS — Z9181 History of falling: Secondary | ICD-10-CM | POA: Diagnosis not present

## 2015-08-15 DIAGNOSIS — M6281 Muscle weakness (generalized): Secondary | ICD-10-CM | POA: Diagnosis not present

## 2015-08-15 DIAGNOSIS — S130XXD Traumatic rupture of cervical intervertebral disc, subsequent encounter: Secondary | ICD-10-CM | POA: Diagnosis not present

## 2015-08-15 DIAGNOSIS — G20 Parkinson's disease: Secondary | ICD-10-CM | POA: Diagnosis not present

## 2015-08-18 DIAGNOSIS — S130XXD Traumatic rupture of cervical intervertebral disc, subsequent encounter: Secondary | ICD-10-CM | POA: Diagnosis not present

## 2015-08-18 DIAGNOSIS — I10 Essential (primary) hypertension: Secondary | ICD-10-CM | POA: Diagnosis not present

## 2015-08-18 DIAGNOSIS — Z9181 History of falling: Secondary | ICD-10-CM | POA: Diagnosis not present

## 2015-08-18 DIAGNOSIS — R2689 Other abnormalities of gait and mobility: Secondary | ICD-10-CM | POA: Diagnosis not present

## 2015-08-18 DIAGNOSIS — G20 Parkinson's disease: Secondary | ICD-10-CM | POA: Diagnosis not present

## 2015-08-18 DIAGNOSIS — M6281 Muscle weakness (generalized): Secondary | ICD-10-CM | POA: Diagnosis not present

## 2015-08-19 DIAGNOSIS — S130XXD Traumatic rupture of cervical intervertebral disc, subsequent encounter: Secondary | ICD-10-CM | POA: Diagnosis not present

## 2015-08-19 DIAGNOSIS — I10 Essential (primary) hypertension: Secondary | ICD-10-CM | POA: Diagnosis not present

## 2015-08-19 DIAGNOSIS — M6281 Muscle weakness (generalized): Secondary | ICD-10-CM | POA: Diagnosis not present

## 2015-08-19 DIAGNOSIS — G20 Parkinson's disease: Secondary | ICD-10-CM | POA: Diagnosis not present

## 2015-08-19 DIAGNOSIS — R2689 Other abnormalities of gait and mobility: Secondary | ICD-10-CM | POA: Diagnosis not present

## 2015-08-19 DIAGNOSIS — Z9181 History of falling: Secondary | ICD-10-CM | POA: Diagnosis not present

## 2015-08-23 DIAGNOSIS — R2689 Other abnormalities of gait and mobility: Secondary | ICD-10-CM | POA: Diagnosis not present

## 2015-08-23 DIAGNOSIS — M6281 Muscle weakness (generalized): Secondary | ICD-10-CM | POA: Diagnosis not present

## 2015-08-23 DIAGNOSIS — G20 Parkinson's disease: Secondary | ICD-10-CM | POA: Diagnosis not present

## 2015-08-23 DIAGNOSIS — Z9181 History of falling: Secondary | ICD-10-CM | POA: Diagnosis not present

## 2015-08-23 DIAGNOSIS — I10 Essential (primary) hypertension: Secondary | ICD-10-CM | POA: Diagnosis not present

## 2015-08-23 DIAGNOSIS — S130XXD Traumatic rupture of cervical intervertebral disc, subsequent encounter: Secondary | ICD-10-CM | POA: Diagnosis not present

## 2015-08-24 DIAGNOSIS — G20 Parkinson's disease: Secondary | ICD-10-CM | POA: Diagnosis not present

## 2015-08-24 DIAGNOSIS — Z9181 History of falling: Secondary | ICD-10-CM | POA: Diagnosis not present

## 2015-08-24 DIAGNOSIS — I10 Essential (primary) hypertension: Secondary | ICD-10-CM | POA: Diagnosis not present

## 2015-08-24 DIAGNOSIS — M6281 Muscle weakness (generalized): Secondary | ICD-10-CM | POA: Diagnosis not present

## 2015-08-24 DIAGNOSIS — R2689 Other abnormalities of gait and mobility: Secondary | ICD-10-CM | POA: Diagnosis not present

## 2015-08-24 DIAGNOSIS — S130XXD Traumatic rupture of cervical intervertebral disc, subsequent encounter: Secondary | ICD-10-CM | POA: Diagnosis not present

## 2015-08-25 DIAGNOSIS — G20 Parkinson's disease: Secondary | ICD-10-CM | POA: Diagnosis not present

## 2015-08-25 DIAGNOSIS — R2689 Other abnormalities of gait and mobility: Secondary | ICD-10-CM | POA: Diagnosis not present

## 2015-08-25 DIAGNOSIS — Z9181 History of falling: Secondary | ICD-10-CM | POA: Diagnosis not present

## 2015-08-25 DIAGNOSIS — S130XXD Traumatic rupture of cervical intervertebral disc, subsequent encounter: Secondary | ICD-10-CM | POA: Diagnosis not present

## 2015-08-25 DIAGNOSIS — M6281 Muscle weakness (generalized): Secondary | ICD-10-CM | POA: Diagnosis not present

## 2015-08-25 DIAGNOSIS — I10 Essential (primary) hypertension: Secondary | ICD-10-CM | POA: Diagnosis not present

## 2015-08-26 DIAGNOSIS — M6281 Muscle weakness (generalized): Secondary | ICD-10-CM | POA: Diagnosis not present

## 2015-08-26 DIAGNOSIS — Z9181 History of falling: Secondary | ICD-10-CM | POA: Diagnosis not present

## 2015-08-26 DIAGNOSIS — G20 Parkinson's disease: Secondary | ICD-10-CM | POA: Diagnosis not present

## 2015-08-26 DIAGNOSIS — S130XXD Traumatic rupture of cervical intervertebral disc, subsequent encounter: Secondary | ICD-10-CM | POA: Diagnosis not present

## 2015-08-26 DIAGNOSIS — R2689 Other abnormalities of gait and mobility: Secondary | ICD-10-CM | POA: Diagnosis not present

## 2015-08-26 DIAGNOSIS — I10 Essential (primary) hypertension: Secondary | ICD-10-CM | POA: Diagnosis not present

## 2015-08-30 ENCOUNTER — Ambulatory Visit: Payer: Medicare Other | Admitting: Internal Medicine

## 2015-08-30 DIAGNOSIS — G20 Parkinson's disease: Secondary | ICD-10-CM | POA: Diagnosis not present

## 2015-08-30 DIAGNOSIS — M6281 Muscle weakness (generalized): Secondary | ICD-10-CM | POA: Diagnosis not present

## 2015-08-30 DIAGNOSIS — Z9181 History of falling: Secondary | ICD-10-CM | POA: Diagnosis not present

## 2015-08-30 DIAGNOSIS — S130XXD Traumatic rupture of cervical intervertebral disc, subsequent encounter: Secondary | ICD-10-CM | POA: Diagnosis not present

## 2015-08-30 DIAGNOSIS — I10 Essential (primary) hypertension: Secondary | ICD-10-CM | POA: Diagnosis not present

## 2015-08-30 DIAGNOSIS — R2689 Other abnormalities of gait and mobility: Secondary | ICD-10-CM | POA: Diagnosis not present

## 2015-08-31 DIAGNOSIS — R2689 Other abnormalities of gait and mobility: Secondary | ICD-10-CM | POA: Diagnosis not present

## 2015-08-31 DIAGNOSIS — S130XXD Traumatic rupture of cervical intervertebral disc, subsequent encounter: Secondary | ICD-10-CM | POA: Diagnosis not present

## 2015-08-31 DIAGNOSIS — M6281 Muscle weakness (generalized): Secondary | ICD-10-CM | POA: Diagnosis not present

## 2015-08-31 DIAGNOSIS — I10 Essential (primary) hypertension: Secondary | ICD-10-CM | POA: Diagnosis not present

## 2015-08-31 DIAGNOSIS — Z9181 History of falling: Secondary | ICD-10-CM | POA: Diagnosis not present

## 2015-08-31 DIAGNOSIS — G20 Parkinson's disease: Secondary | ICD-10-CM | POA: Diagnosis not present

## 2015-09-01 DIAGNOSIS — G20 Parkinson's disease: Secondary | ICD-10-CM | POA: Diagnosis not present

## 2015-09-01 DIAGNOSIS — S130XXD Traumatic rupture of cervical intervertebral disc, subsequent encounter: Secondary | ICD-10-CM | POA: Diagnosis not present

## 2015-09-01 DIAGNOSIS — I10 Essential (primary) hypertension: Secondary | ICD-10-CM | POA: Diagnosis not present

## 2015-09-01 DIAGNOSIS — R2689 Other abnormalities of gait and mobility: Secondary | ICD-10-CM | POA: Diagnosis not present

## 2015-09-01 DIAGNOSIS — M6281 Muscle weakness (generalized): Secondary | ICD-10-CM | POA: Diagnosis not present

## 2015-09-01 DIAGNOSIS — Z9181 History of falling: Secondary | ICD-10-CM | POA: Diagnosis not present

## 2015-09-06 DIAGNOSIS — G20 Parkinson's disease: Secondary | ICD-10-CM | POA: Diagnosis not present

## 2015-09-06 DIAGNOSIS — I10 Essential (primary) hypertension: Secondary | ICD-10-CM | POA: Diagnosis not present

## 2015-09-06 DIAGNOSIS — R2689 Other abnormalities of gait and mobility: Secondary | ICD-10-CM | POA: Diagnosis not present

## 2015-09-06 DIAGNOSIS — M6281 Muscle weakness (generalized): Secondary | ICD-10-CM | POA: Diagnosis not present

## 2015-09-06 DIAGNOSIS — Z9181 History of falling: Secondary | ICD-10-CM | POA: Diagnosis not present

## 2015-09-06 DIAGNOSIS — S130XXD Traumatic rupture of cervical intervertebral disc, subsequent encounter: Secondary | ICD-10-CM | POA: Diagnosis not present

## 2015-09-12 ENCOUNTER — Telehealth: Payer: Self-pay

## 2015-09-12 DIAGNOSIS — G20 Parkinson's disease: Secondary | ICD-10-CM | POA: Diagnosis not present

## 2015-09-12 NOTE — Telephone Encounter (Signed)
Home Health Cert/Plan of Care received for 08/15/2015 - 10/13/2015. Placed on MD's desk for signature

## 2015-09-12 NOTE — Telephone Encounter (Signed)
Paperwork signed, faxed, copy sent to scan 

## 2015-09-13 DIAGNOSIS — I10 Essential (primary) hypertension: Secondary | ICD-10-CM | POA: Diagnosis not present

## 2015-09-13 DIAGNOSIS — R2689 Other abnormalities of gait and mobility: Secondary | ICD-10-CM | POA: Diagnosis not present

## 2015-09-13 DIAGNOSIS — M6281 Muscle weakness (generalized): Secondary | ICD-10-CM | POA: Diagnosis not present

## 2015-09-13 DIAGNOSIS — G20 Parkinson's disease: Secondary | ICD-10-CM | POA: Diagnosis not present

## 2015-09-13 DIAGNOSIS — Z9181 History of falling: Secondary | ICD-10-CM | POA: Diagnosis not present

## 2015-09-13 DIAGNOSIS — S130XXD Traumatic rupture of cervical intervertebral disc, subsequent encounter: Secondary | ICD-10-CM | POA: Diagnosis not present

## 2015-09-15 ENCOUNTER — Encounter (HOSPITAL_COMMUNITY): Payer: Self-pay | Admitting: *Deleted

## 2015-09-15 ENCOUNTER — Ambulatory Visit: Payer: Medicare Other | Admitting: Neurology

## 2015-09-15 DIAGNOSIS — E785 Hyperlipidemia, unspecified: Secondary | ICD-10-CM | POA: Insufficient documentation

## 2015-09-15 DIAGNOSIS — E039 Hypothyroidism, unspecified: Secondary | ICD-10-CM | POA: Diagnosis not present

## 2015-09-15 DIAGNOSIS — M542 Cervicalgia: Secondary | ICD-10-CM | POA: Insufficient documentation

## 2015-09-15 DIAGNOSIS — Z79899 Other long term (current) drug therapy: Secondary | ICD-10-CM | POA: Insufficient documentation

## 2015-09-15 DIAGNOSIS — Z880 Allergy status to penicillin: Secondary | ICD-10-CM | POA: Insufficient documentation

## 2015-09-15 DIAGNOSIS — J449 Chronic obstructive pulmonary disease, unspecified: Secondary | ICD-10-CM | POA: Diagnosis not present

## 2015-09-15 DIAGNOSIS — F419 Anxiety disorder, unspecified: Secondary | ICD-10-CM | POA: Insufficient documentation

## 2015-09-15 DIAGNOSIS — Z87440 Personal history of urinary (tract) infections: Secondary | ICD-10-CM | POA: Diagnosis not present

## 2015-09-15 DIAGNOSIS — I10 Essential (primary) hypertension: Secondary | ICD-10-CM | POA: Diagnosis not present

## 2015-09-15 DIAGNOSIS — G20 Parkinson's disease: Secondary | ICD-10-CM | POA: Diagnosis not present

## 2015-09-15 DIAGNOSIS — M6281 Muscle weakness (generalized): Secondary | ICD-10-CM | POA: Diagnosis not present

## 2015-09-15 DIAGNOSIS — Z7982 Long term (current) use of aspirin: Secondary | ICD-10-CM | POA: Insufficient documentation

## 2015-09-15 DIAGNOSIS — M62838 Other muscle spasm: Secondary | ICD-10-CM | POA: Diagnosis not present

## 2015-09-15 DIAGNOSIS — G8929 Other chronic pain: Secondary | ICD-10-CM | POA: Diagnosis not present

## 2015-09-15 DIAGNOSIS — Z8701 Personal history of pneumonia (recurrent): Secondary | ICD-10-CM | POA: Insufficient documentation

## 2015-09-15 DIAGNOSIS — Z862 Personal history of diseases of the blood and blood-forming organs and certain disorders involving the immune mechanism: Secondary | ICD-10-CM | POA: Diagnosis not present

## 2015-09-15 DIAGNOSIS — S130XXD Traumatic rupture of cervical intervertebral disc, subsequent encounter: Secondary | ICD-10-CM | POA: Diagnosis not present

## 2015-09-15 DIAGNOSIS — F329 Major depressive disorder, single episode, unspecified: Secondary | ICD-10-CM | POA: Insufficient documentation

## 2015-09-15 DIAGNOSIS — R2689 Other abnormalities of gait and mobility: Secondary | ICD-10-CM | POA: Diagnosis not present

## 2015-09-15 DIAGNOSIS — Z9181 History of falling: Secondary | ICD-10-CM | POA: Diagnosis not present

## 2015-09-15 DIAGNOSIS — K219 Gastro-esophageal reflux disease without esophagitis: Secondary | ICD-10-CM | POA: Diagnosis not present

## 2015-09-15 DIAGNOSIS — Z8673 Personal history of transient ischemic attack (TIA), and cerebral infarction without residual deficits: Secondary | ICD-10-CM | POA: Insufficient documentation

## 2015-09-15 MED ORDER — ONDANSETRON 4 MG PO TBDP
4.0000 mg | ORAL_TABLET | Freq: Once | ORAL | Status: AC
  Administered 2015-09-15: 4 mg via ORAL

## 2015-09-15 MED ORDER — ONDANSETRON 4 MG PO TBDP
ORAL_TABLET | ORAL | Status: AC
  Filled 2015-09-15: qty 1

## 2015-09-15 NOTE — ED Notes (Addendum)
Pt arrives via EMS from home. Pt having neck pain about 1 hour ago, took hydrocodone 45 minutes ago, now describes pain as 0/10, only complaint is nausea from the hydrocodone. Hx  degenerative c5 &c6. Denies recent fall or injury. vss en route to the ED.

## 2015-09-15 NOTE — ED Notes (Signed)
Pt says she took a pain pill for bad disk in her neck today and now feels the pain is better, only c/o nausea at this time.

## 2015-09-16 ENCOUNTER — Emergency Department (HOSPITAL_COMMUNITY)
Admission: EM | Admit: 2015-09-16 | Discharge: 2015-09-16 | Disposition: A | Discharge status: Home / Self Care | Payer: Medicare Other | Attending: Emergency Medicine | Admitting: Emergency Medicine

## 2015-09-16 DIAGNOSIS — M542 Cervicalgia: Secondary | ICD-10-CM | POA: Diagnosis not present

## 2015-09-16 DIAGNOSIS — M62838 Other muscle spasm: Secondary | ICD-10-CM

## 2015-09-16 DIAGNOSIS — G8929 Other chronic pain: Secondary | ICD-10-CM

## 2015-09-16 MED ORDER — METHOCARBAMOL 1000 MG/10ML IJ SOLN: 1000.0000 mg | Freq: Once | INTRAMUSCULAR | Status: DC

## 2015-09-16 MED ORDER — METHOCARBAMOL 1000 MG/10ML IJ SOLN
1000.0000 mg | Freq: Once | INTRAMUSCULAR | Status: AC
  Administered 2015-09-16: 1000 mg via INTRAVENOUS
  Filled 2015-09-16: qty 10

## 2015-09-16 MED ORDER — ONDANSETRON HCL 4 MG/2ML IJ SOLN
4.0000 mg | Freq: Once | INTRAMUSCULAR | Status: AC
  Administered 2015-09-16: 4 mg via INTRAVENOUS
  Filled 2015-09-16: qty 2

## 2015-09-16 MED ORDER — MORPHINE SULFATE (PF) 4 MG/ML IV SOLN
4.0000 mg | Freq: Once | INTRAVENOUS | Status: AC
  Administered 2015-09-16: 4 mg via INTRAVENOUS
  Filled 2015-09-16: qty 1

## 2015-09-16 MED ORDER — OXYCODONE-ACETAMINOPHEN 5-325 MG PO TABS: 1.0000 | ORAL_TABLET | ORAL | Status: DC | PRN

## 2015-09-16 MED ORDER — ONDANSETRON HCL 4 MG PO TABS: 4.0000 mg | ORAL_TABLET | Freq: Four times a day (QID) | ORAL | Status: DC | PRN

## 2015-09-16 NOTE — Discharge Instructions (Signed)
You may increase your methocarbamol to 3 times a day or 4 times a day as needed to help control the muscle spasms in your neck.  Muscle Cramps and Spasms Muscle cramps and spasms occur when a muscle or muscles tighten and you have no control over this tightening (involuntary muscle contraction). They are a common problem and can develop in any muscle. The most common place is in the calf muscles of the leg. Both muscle cramps and muscle spasms are involuntary muscle contractions, but they also have differences:   Muscle cramps are sporadic and painful. They may last a few seconds to a quarter of an hour. Muscle cramps are often more forceful and last longer than muscle spasms.  Muscle spasms may or may not be painful. They may also last just a few seconds or much longer. CAUSES  It is uncommon for cramps or spasms to be due to a serious underlying problem. In many cases, the cause of cramps or spasms is unknown. Some common causes are:   Overexertion.   Overuse from repetitive motions (doing the same thing over and over).   Remaining in a certain position for a long period of time.   Improper preparation, form, or technique while performing a sport or activity.   Dehydration.   Injury.   Side effects of some medicines.   Abnormally low levels of the salts and ions in your blood (electrolytes), especially potassium and calcium. This could happen if you are taking water pills (diuretics) or you are pregnant.  Some underlying medical problems can make it more likely to develop cramps or spasms. These include, but are not limited to:   Diabetes.   Parkinson disease.   Hormone disorders, such as thyroid problems.   Alcohol abuse.   Diseases specific to muscles, joints, and bones.   Blood vessel disease where not enough blood is getting to the muscles.  HOME CARE INSTRUCTIONS   Stay well hydrated. Drink enough water and fluids to keep your urine clear or pale  yellow.  It may be helpful to massage, stretch, and relax the affected muscle.  For tight or tense muscles, use a warm towel, heating pad, or hot shower water directed to the affected area.  If you are sore or have pain after a cramp or spasm, applying ice to the affected area may relieve discomfort.  Put ice in a plastic bag.  Place a towel between your skin and the bag.  Leave the ice on for 15-20 minutes, 03-04 times a day.  Medicines used to treat a known cause of cramps or spasms may help reduce their frequency or severity. Only take over-the-counter or prescription medicines as directed by your caregiver. SEEK MEDICAL CARE IF:  Your cramps or spasms get more severe, more frequent, or do not improve over time.  MAKE SURE YOU:   Understand these instructions.  Will watch your condition.  Will get help right away if you are not doing well or get worse.   This information is not intended to replace advice given to you by your health care provider. Make sure you discuss any questions you have with your health care provider.   Document Released: 01/05/2002 Document Revised: 11/10/2012 Document Reviewed: 07/02/2012 Elsevier Interactive Patient Education 2016 [employer]  Ondansetron tablets What is this medicine? ONDANSETRON (on DAN se tron) is used to treat nausea and vomiting caused by chemotherapy. It is also used to prevent or treat nausea and vomiting after surgery. This medicine may  be used for other purposes; ask your health care provider or pharmacist if you have questions. What should I tell my health care provider before I take this medicine? They need to know if you have any of these conditions: -heart disease -history of irregular heartbeat -liver disease -low levels of magnesium or potassium in the blood -an unusual or allergic reaction to ondansetron, granisetron, other medicines, foods, dyes, or preservatives -pregnant or trying to get  pregnant -breast-feeding How should I use this medicine? Take this medicine by mouth with a glass of water. Follow the directions on your prescription label. Take your doses at regular intervals. Do not take your medicine more often than directed. Talk to your pediatrician regarding the use of this medicine in children. Special care may be needed. Overdosage: If you think you have taken too much of this medicine contact a poison control center or emergency room at once. NOTE: This medicine is only for you. Do not share this medicine with others. What if I miss a dose? If you miss a dose, take it as soon as you can. If it is almost time for your next dose, take only that dose. Do not take double or extra doses. What may interact with this medicine? Do not take this medicine with any of the following medications: -apomorphine -certain medicines for fungal infections like fluconazole, itraconazole, ketoconazole, posaconazole, voriconazole -cisapride -dofetilide -dronedarone -pimozide -thioridazine -ziprasidone This medicine may also interact with the following medications: -carbamazepine -certain medicines for depression, anxiety, or psychotic disturbances -fentanyl -linezolid -MAOIs like Carbex, Eldepryl, Marplan, Nardil, and Parnate -methylene blue (injected into a vein) -other medicines that prolong the QT interval (cause an abnormal heart rhythm) -phenytoin -rifampicin -tramadol This list may not describe all possible interactions. Give your health care provider a list of all the medicines, herbs, non-prescription drugs, or dietary supplements you use. Also tell them if you smoke, drink alcohol, or use illegal drugs. Some items may interact with your medicine. What should I watch for while using this medicine? Check with your doctor or health care professional right away if you have any sign of an allergic reaction. What side effects may I notice from receiving this medicine? Side  effects that you should report to your doctor or health care professional as soon as possible: -allergic reactions like skin rash, itching or hives, swelling of the face, lips or tongue -breathing problems -confusion -dizziness -fast or irregular heartbeat -feeling faint or lightheaded, falls -fever and chills -loss of balance or coordination -seizures -sweating -swelling of the hands or feet -tightness in the chest -tremors -unusually weak or tired Side effects that usually do not require medical attention (report to your doctor or health care professional if they continue or are bothersome): -constipation or diarrhea -headache This list may not describe all possible side effects. Call your doctor for medical advice about side effects. You may report side effects to FDA at 1-800-FDA-1088. Where should I keep my medicine? Keep out of the reach of children. Store between 2 and 30 degrees C (36 and 86 degrees F). Throw away any unused medicine after the expiration date. NOTE: This sheet is a summary. It may not cover all possible information. If you have questions about this medicine, talk to your doctor, pharmacist, or health care provider.    2016, Elsevier/Gold Standard. (2013-04-22 16:27:45)  Acetaminophen; Oxycodone tablets What is this medicine? ACETAMINOPHEN; OXYCODONE (a set a MEE noe fen; ox i KOE done) is a pain reliever. It is used to  treat moderate to severe pain. This medicine may be used for other purposes; ask your health care provider or pharmacist if you have questions. What should I tell my health care provider before I take this medicine? They need to know if you have any of these conditions: -brain tumor -Crohn's disease, inflammatory bowel disease, or ulcerative colitis -drug abuse or addiction -head injury -heart or circulation problems -if you often drink alcohol -kidney disease or problems going to the bathroom -liver disease -lung disease, asthma, or  breathing problems -an unusual or allergic reaction to acetaminophen, oxycodone, other opioid analgesics, other medicines, foods, dyes, or preservatives -pregnant or trying to get pregnant -breast-feeding How should I use this medicine? Take this medicine by mouth with a full glass of water. Follow the directions on the prescription label. You can take it with or without food. If it upsets your stomach, take it with food. Take your medicine at regular intervals. Do not take it more often than directed. Talk to your pediatrician regarding the use of this medicine in children. Special care may be needed. Patients over 54 years old may have a stronger reaction and need a smaller dose. Overdosage: If you think you have taken too much of this medicine contact a poison control center or emergency room at once. NOTE: This medicine is only for you. Do not share this medicine with others. What if I miss a dose? If you miss a dose, take it as soon as you can. If it is almost time for your next dose, take only that dose. Do not take double or extra doses. What may interact with this medicine? -alcohol -antihistamines -barbiturates like amobarbital, butalbital, butabarbital, methohexital, pentobarbital, phenobarbital, thiopental, and secobarbital -benztropine -drugs for bladder problems like solifenacin, trospium, oxybutynin, tolterodine, hyoscyamine, and methscopolamine -drugs for breathing problems like ipratropium and tiotropium -drugs for certain stomach or intestine problems like propantheline, homatropine methylbromide, glycopyrrolate, atropine, belladonna, and dicyclomine -general anesthetics like etomidate, ketamine, nitrous oxide, propofol, desflurane, enflurane, halothane, isoflurane, and sevoflurane -medicines for depression, anxiety, or psychotic disturbances -medicines for sleep -muscle relaxants -naltrexone -narcotic medicines (opiates) for pain -phenothiazines like perphenazine,  thioridazine, chlorpromazine, mesoridazine, fluphenazine, prochlorperazine, promazine, and trifluoperazine -scopolamine -tramadol -trihexyphenidyl This list may not describe all possible interactions. Give your health care provider a list of all the medicines, herbs, non-prescription drugs, or dietary supplements you use. Also tell them if you smoke, drink alcohol, or use illegal drugs. Some items may interact with your medicine. What should I watch for while using this medicine? Tell your doctor or health care professional if your pain does not go away, if it gets worse, or if you have new or a different type of pain. You may develop tolerance to the medicine. Tolerance means that you will need a higher dose of the medication for pain relief. Tolerance is normal and is expected if you take this medicine for a long time. Do not suddenly stop taking your medicine because you may develop a severe reaction. Your body becomes used to the medicine. This does NOT mean you are addicted. Addiction is a behavior related to getting and using a drug for a non-medical reason. If you have pain, you have a medical reason to take pain medicine. Your doctor will tell you how much medicine to take. If your doctor wants you to stop the medicine, the dose will be slowly lowered over time to avoid any side effects. You may get drowsy or dizzy. Do not drive, use machinery, or do  anything that needs mental alertness until you know how this medicine affects you. Do not stand or sit up quickly, especially if you are an older patient. This reduces the risk of dizzy or fainting spells. Alcohol may interfere with the effect of this medicine. Avoid alcoholic drinks. There are different types of narcotic medicines (opiates) for pain. If you take more than one type at the same time, you may have more side effects. Give your health care provider a list of all medicines you use. Your doctor will tell you how much medicine to take. Do not  take more medicine than directed. Call emergency for help if you have problems breathing. The medicine will cause constipation. Try to have a bowel movement at least every 2 to 3 days. If you do not have a bowel movement for 3 days, call your doctor or health care professional. Do not take Tylenol (acetaminophen) or medicines that have acetaminophen with this medicine. Too much acetaminophen can be very dangerous. Many nonprescription medicines contain acetaminophen. Always read the labels carefully to avoid taking more acetaminophen. What side effects may I notice from receiving this medicine? Side effects that you should report to your doctor or health care professional as soon as possible: -allergic reactions like skin rash, itching or hives, swelling of the face, lips, or tongue -breathing difficulties, wheezing -confusion -light headedness or fainting spells -severe stomach pain -unusually weak or tired -yellowing of the skin or the whites of the eyes Side effects that usually do not require medical attention (report to your doctor or health care professional if they continue or are bothersome): -dizziness -drowsiness -nausea -vomiting This list may not describe all possible side effects. Call your doctor for medical advice about side effects. You may report side effects to FDA at 1-800-FDA-1088. Where should I keep my medicine? Keep out of the reach of children. This medicine can be abused. Keep your medicine in a safe place to protect it from theft. Do not share this medicine with anyone. Selling or giving away this medicine is dangerous and against the law. This medicine may cause accidental overdose and death if it taken by other adults, children, or pets. Mix any unused medicine with a substance like cat litter or coffee grounds. Then throw the medicine away in a sealed container like a sealed bag or a coffee can with a lid. Do not use the medicine after the expiration date. Store at  room temperature between 20 and 25 degrees C (68 and 77 degrees F). NOTE: This sheet is a summary. It may not cover all possible information. If you have questions about this medicine, talk to your doctor, pharmacist, or health care provider.    2016, Elsevier/Gold Standard. (2014-06-16 15:18:46)

## 2015-09-16 NOTE — ED Provider Notes (Signed)
CSN: [id number]     Arrival date & time 09/15/15  2254 History   By signing my name below, I, Forrestine Him, attest that this documentation has been prepared under the direction and in the presence of Delora Fuel, MD.  Electronically Signed: Forrestine Him, ED Scribe. 09/16/2015. 3:42 AM.   Chief Complaint  Patient presents with  . Neck Pain   The history is provided by the patient. No language interpreter was used.    HPI Comments: [name] is a 81 y.o. female with a PMHx of HTN, cerebrovascular disease, DJD, TIA, COPD, stroke, and hyperlipidemia who presents to the Emergency Department complaining of constant, ongoing neck pain x 1 hour prior to arrival. Currently she rates pain 10/10. Pt also reports pain to the back of her head. No aggravating or alleviating factors at this time. She also reports mild nausea. Prescribed Hydrocodone attempted 45 minutes prior to arrival with some improvement. Pt had an epidural injection in December of 2016 with complete long term relief, however, she has not been back for follow up.  PCP: Cathlean Cower, MD    Past Medical History  Diagnosis Date  . History of sudden visual loss   . Acute cystitis   . Bronchitis, acute   . Hypertension   . Cerebrovascular disease   . Hypothyroidism   . GERD (gastroesophageal reflux disease)   . Diverticulosis of colon   . DJD (degenerative joint disease)   . Back pain   . Transient ischemic attack   . Anxiety   . Depression   . Asthma   . COPD (chronic obstructive pulmonary disease) ([place])   . Shortness of breath     with exertion   . Pneumonia 2015    hx of x 2   . 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 UTI (lower urinary tract infection)     hx of   . Carotid artery occlusion   . Hyperlipidemia   . Aortic stenosis, mild 11/18/2013  . Pulmonary hypertension, moderate to severe ([place]) 11/19/2014  . Anemia   . Fall 07/05/2015   Past Surgical History  Procedure Laterality Date  . Vaginal  hysterectomy    . Colectomy    . Tubal ligation    . Replacement total knee      right and left knee  . Cardiothoracic procedure  01/13/2009    Right   . Foot surgery      nerve cut between toes   . Endarterectomy      right carotid endartarectomy - 2010  . Total knee arthroplasty Left 11/17/2012    Procedure: TOTAL KNEE ARTHROPLASTY;  Surgeon: Gearlean Alf, MD;  Location: [facility];  Service: Orthopedics;  Laterality: Left;  . Tee without cardioversion N/A 02/08/2014    Procedure: TRANSESOPHAGEAL ECHOCARDIOGRAM (TEE);  Surgeon: Larey Dresser, MD;  Location: [place];  Service: Cardiovascular;  Laterality: N/A;  . Cardiac catheterization  02/18/14  . Left and right heart catheterization with coronary angiogram N/A 02/18/2014    Procedure: LEFT AND RIGHT HEART CATHETERIZATION WITH CORONARY ANGIOGRAM;  Surgeon: Larey Dresser, MD;  Location: [facility] CATH LAB;  Service: Cardiovascular;  Laterality: N/A;   Family History  Problem Relation Age of Onset  . Emphysema Father   . Heart disease Mother   . Hyperlipidemia Mother   . Hypertension Mother   . Coronary artery disease Sister   . Heart disease Sister   . Hypertension Sister   .  Varicose Veins Sister   . AAA (abdominal aortic aneurysm) Sister   . Rheumatic fever Brother     x2  . Heart attack Brother    Social History  Substance Use Topics  . Smoking status: Never Smoker   . Smokeless tobacco: Never Used  . Alcohol Use: No   OB History    No data available     Review of Systems  Constitutional: Negative for fever and chills.  Respiratory: Negative for cough and shortness of breath.   Cardiovascular: Negative for chest pain.  Gastrointestinal: Negative for nausea, vomiting and abdominal pain.  Musculoskeletal: Positive for neck pain.  Neurological: Negative for headaches.  Psychiatric/Behavioral: Negative for confusion.  All other systems reviewed and are negative.     Allergies  Relafen; Lipitor; Other; and  Penicillins  Home Medications   Prior to Admission medications   Medication Sig Start Date End Date Taking? Authorizing Provider  ADVAIR HFA 230-21 MCG/ACT inhaler Inhale 2 puffs into the lungs 2 (two) times daily. 11/23/14   Biagio Borg, MD  albuterol (PROVENTIL HFA;VENTOLIN HFA) 108 (90 BASE) MCG/ACT inhaler Inhale 2 puffs into the lungs every 6 (six) hours as needed for wheezing or shortness of breath. 11/19/14   Biagio Borg, MD  amLODipine (NORVASC) 5 MG tablet Take 1 tablet (5 mg total) by mouth daily. 05/20/14   Biagio Borg, MD  aspirin EC 81 MG tablet Take 1 tablet (81 mg total) by mouth daily. 01/26/14   Larey Dresser, MD  carbidopa-levodopa (SINEMET IR) 25-100 MG tablet 1 1/2 tab by mouth three times daily 7a, 11a, 4 p for parkinson's disease    Historical Provider, MD  Cranberry-Vitamin C-Probiotic (AZO CRANBERRY) 250-30 MG TABS Take 1 capsule by mouth daily.    Historical Provider, MD  diazepam (VALIUM) 2 MG tablet 1/2 - 1 tab by mouth twice per day as needed 07/09/15   Kelvin Cellar, MD  escitalopram (LEXAPRO) 10 MG tablet Take 1 tablet (10 mg total) by mouth daily. 05/20/14   Biagio Borg, MD  ferrous sulfate 325 (65 FE) MG tablet Take 325 mg by mouth daily with breakfast.    Historical Provider, MD  fish oil-omega-3 fatty acids 1000 MG capsule Take 1 g by mouth daily.     Historical Provider, MD  levothyroxine (SYNTHROID, LEVOTHROID) 50 MCG tablet Take 1 tablet (50 mcg total) by mouth daily. 11/19/14   Biagio Borg, MD  methocarbamol (ROBAXIN) 500 MG tablet Take 1 tablet (500 mg total) by mouth 2 (two) times daily as needed for muscle spasms. 06/28/15   Noemi Chapel, MD  Nutritional Supplements (OSTEO ADVANCE) TABS Take 2 tablets by mouth daily.     Historical Provider, MD  omeprazole (PRILOSEC) 20 MG capsule Take 1 capsule (20 mg total) by mouth daily. 08/12/14   Biagio Borg, MD  pravastatin (PRAVACHOL) 20 MG tablet Take 1 tablet (20 mg total) by mouth daily at 6 PM. 07/09/15    Kelvin Cellar, MD   Triage Vitals: BP 141/59 mmHg  Pulse 69  Temp(Src) 97.4 F (36.3 C) (Oral)  Resp 20  SpO2 97%   Physical Exam  Constitutional: She is oriented to person, place, and time. She appears well-developed and well-nourished. No distress.  HENT:  Head: Normocephalic and atraumatic.  Eyes: EOM are normal. Pupils are equal, round, and reactive to light.  Neck: No JVD present.  Moderate tenderness diffusely Several bilateral paracervical muscle spasm  Cardiovascular: Normal rate, regular rhythm and  normal heart sounds.   No murmur heard. Pulmonary/Chest: Effort normal and breath sounds normal. She has no wheezes. She has no rales. She exhibits no tenderness.  Abdominal: Soft. Bowel sounds are normal. She exhibits no distension and no mass. There is no tenderness.  Musculoskeletal: Normal range of motion. She exhibits no edema.  Lymphadenopathy:    She has no cervical adenopathy.  Neurological: She is alert and oriented to person, place, and time. No cranial nerve deficit. She exhibits normal muscle tone. Coordination normal.  Skin: Skin is warm and dry. No rash noted.  Psychiatric: She has a normal mood and affect.  Nursing note and vitals reviewed.   ED Course  Procedures (including critical care time)  DIAGNOSTIC STUDIES: Oxygen Saturation is 97% on RA, Normal by my interpretation.    COORDINATION OF CARE: 3:41 AM- Will give Zofran, Morphine, and Robaxin. Discussed treatment plan with pt at bedside and pt agreed to plan.     MDM   Final diagnoses:  Chronic neck pain  Cervical paraspinal muscle spasm    Patient with known chronic neck pain presents with exacerbation of same which is not adequately relieved by analgesics at home. Old records are reviewed and she has been seen in the past for neck pains. There is no acute injuries and no indication for advanced imaging studies today. IV was started and she was given morphine and methocarbamol intravenously as  well as ondansetron for nausea and had substantial relief of pain. She has a prescription for methocarbamol to take it twice a day. I recommended that she increase it to 3 or 4 times a day as needed. She is given a prescription for ondansetron for nausea and oxycodone-acetaminophen to use as needed for her pain relief. In the past, she has had epidural steroid injections and I have recommended that she contact her neurosurgeon to arrange for another one of those.  I personally performed the services described in this documentation, which was scribed in my presence. The recorded information has been reviewed and is accurate.      Delora Fuel, MD [id number]

## 2015-09-19 ENCOUNTER — Ambulatory Visit (INDEPENDENT_AMBULATORY_CARE_PROVIDER_SITE_OTHER): Payer: Medicare Other | Admitting: Neurology

## 2015-09-19 ENCOUNTER — Encounter: Payer: Self-pay | Admitting: Neurology

## 2015-09-19 VITALS — BP 126/54 | HR 71 | Ht 65.0 in | Wt 176.0 lb

## 2015-09-19 DIAGNOSIS — G20 Parkinson's disease: Secondary | ICD-10-CM | POA: Diagnosis not present

## 2015-09-19 DIAGNOSIS — G20A1 Parkinson's disease without dyskinesia, without mention of fluctuations: Secondary | ICD-10-CM

## 2015-09-19 DIAGNOSIS — R1310 Dysphagia, unspecified: Secondary | ICD-10-CM

## 2015-09-19 DIAGNOSIS — R413 Other amnesia: Secondary | ICD-10-CM

## 2015-09-19 NOTE — Progress Notes (Signed)
[name] was seen today in the movement disorders clinic for neurologic consultation at the request of Cathlean Cower, MD.   The patient is accompanied by her daughter and granddaughter who supplements the history.  The patient is seen today in neurologic consultation for possible Parkinson's disease.  I have reviewed an extensive number of records made available to me.  The patient was hospitalized on December 8 after a fall 2 days prior to admission.  The fall occurred after the patient got up at night while attempting to use the bathroom.  She did apparently taken a Valium earlier that night.  This was just one of many falls that she had sustained.  Her family had questioned whether or not she had had Parkinson's disease and a neurologic consultation was obtained while she was in the hospital.  The patient saw Dr. Leonel Ramsay and he felt that her symptoms were consistent with possible Parkinson's disease.  She was started on levodopa, 1 po tid and told to follow-up here.  Pt and family indicate that it has helped walking and tremor.  She is currently in skilled rehab for 30 days but is only in for short term and has only been there for 1 day.    The patient has previously seen [facility] and last saw [facility] neurology on 03/15/2015.  She has seen Dr. [name] as well as several of his nurse practitioners.  I reviewed those records in detail.  She had a TIA on 01/03/2013 but there was no tremor when seen by [facility] neurology in July, 2014.  On her follow-up in January, 2015 notes indicate that "action tremor" had developed in the right hand and a resting tremor had developed in the right foot.  In February, 2016 notes indicate that the patient now had decreased arm swing and a mild resting tremor of the right hand and Mysoline was offered to the patient but she did not want that.  On 03/15/2015 the patient reported more falls and it was recommended that they just monitor the tremor.  No new  medications or therapy were given.  She reports fam hx of PD in maternal aunt.   09/19/15 update:  The patient presents today for follow-up, accompanied by her daughter who supplements the history.  I have reviewed prior records made available to me.  Last visit, her levodopa was increased to 1-1/2 tablets 3 times per day and it was recommended that she take these at 7 AM/11 AM/4 PM.  She did not do this and is still taking it tid and spreading out the last to take at bedtime.  States that she remembers when she was in the SNF she knew she was taking an extra 1/2 tablet of something but didn't know what.  She denies any falls since our last visit.  No hallucinations.  No lightheadedness or near syncope.  She did have a modified barium swallow on 08/05/2015.  This was normal.  It was noted that her respiratory status may allow for episodic aspiration due to discoordination of swallow/apneic period.  I did note from SNF records that the patient was on valium 2 mg prn for tremor.   Is still taking it at home but isn't sure how often but thinks one time per day. 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 Relafen [Nabumetone] Diarrhea and Other (See Comments)  GI / Urinary Bleeding  . Lipitor [Atorvastatin] Other (See Comments)    Causes memory loss  . Other Other (See Comments)    "orvail" unknown:  Causes Bloody stool  . Penicillins Hives, Itching, Swelling and Rash    Has patient had a PCN reaction causing immediate rash, facial/tongue/throat swelling, SOB or lightheadedness with hypotension: no Has patient had a PCN reaction causing severe rash involving mucus membranes or skin necrosis: unknown Has patient had a PCN reaction that required hospitalization no Has patient had a PCN reaction occurring within the last 10 years: no If all of the above answers are "NO", then may proceed with Cephalosporin  use.     CURRENT MEDICATIONS:  Outpatient Encounter Prescriptions as of 09/19/2015  Medication Sig  . ADVAIR HFA 230-21 MCG/ACT inhaler Inhale 2 puffs into the lungs 2 (two) times daily.  Marland Kitchen albuterol (PROVENTIL HFA;VENTOLIN HFA) 108 (90 BASE) MCG/ACT inhaler Inhale 2 puffs into the lungs every 6 (six) hours as needed for wheezing or shortness of breath.  Marland Kitchen amLODipine (NORVASC) 5 MG tablet Take 1 tablet (5 mg total) by mouth daily.  Marland Kitchen aspirin EC 81 MG tablet Take 1 tablet (81 mg total) by mouth daily.  . carbidopa-levodopa (SINEMET IR) 25-100 MG tablet 1 1/2 tab by mouth three times daily 7a, 11a, 4 p for parkinson's disease  . Cranberry-Vitamin C-Probiotic (AZO CRANBERRY) 250-30 MG TABS Take 1 capsule by mouth daily.  . diazepam (VALIUM) 2 MG tablet 1/2 - 1 tab by mouth twice per day as needed  . escitalopram (LEXAPRO) 10 MG tablet Take 1 tablet (10 mg total) by mouth daily.  . ferrous sulfate 325 (65 FE) MG tablet Take 325 mg by mouth daily with breakfast.  . fish oil-omega-3 fatty acids 1000 MG capsule Take 1 g by mouth daily.   Marland Kitchen levothyroxine (SYNTHROID, LEVOTHROID) 50 MCG tablet Take 1 tablet (50 mcg total) by mouth daily.  . methocarbamol (ROBAXIN) 500 MG tablet Take 1 tablet (500 mg total) by mouth 2 (two) times daily as needed for muscle spasms.  . Nutritional Supplements (OSTEO ADVANCE) TABS Take 2 tablets by mouth daily.   Marland Kitchen omeprazole (PRILOSEC) 20 MG capsule Take 1 capsule (20 mg total) by mouth daily.  . ondansetron (ZOFRAN) 4 MG tablet Take 1 tablet (4 mg total) by mouth every 6 (six) hours as needed for nausea or vomiting.  Marland Kitchen oxyCODONE-acetaminophen (PERCOCET) 5-325 MG tablet Take 1 tablet by mouth every 4 (four) hours as needed for moderate pain.  . pravastatin (PRAVACHOL) 20 MG tablet Take 1 tablet (20 mg total) by mouth daily at 6 PM.   No facility-administered encounter medications on file as of 09/19/2015.    PAST MEDICAL HISTORY:   Past Medical History  Diagnosis Date   . History of sudden visual loss   . Acute cystitis   . Bronchitis, acute   . Hypertension   . Cerebrovascular disease   . Hypothyroidism   . GERD (gastroesophageal reflux disease)   . Diverticulosis of colon   . DJD (degenerative joint disease)   . Back pain   . Transient ischemic attack   . Anxiety   . Depression   . Asthma   . COPD (chronic obstructive pulmonary disease) ([place])   . Shortness of breath     with exertion   . Pneumonia 2015    hx of x 2   . Stroke [facility])     2010 partial blind in left eye   . UTI (  lower urinary tract infection)     hx of   . Carotid artery occlusion   . Hyperlipidemia   . Aortic stenosis, mild 11/18/2013  . Pulmonary hypertension, moderate to severe ([place]) 11/19/2014  . Anemia   . Fall 07/05/2015    PAST SURGICAL HISTORY:   Past Surgical History  Procedure Laterality Date  . Vaginal hysterectomy    . Colectomy    . Tubal ligation    . Replacement total knee      right and left knee  . Cardiothoracic procedure  01/13/2009    Right   . Foot surgery      nerve cut between toes   . Endarterectomy      right carotid endartarectomy - 2010  . Total knee arthroplasty Left 11/17/2012    Procedure: TOTAL KNEE ARTHROPLASTY;  Surgeon: Gearlean Alf, MD;  Location: [facility];  Service: Orthopedics;  Laterality: Left;  . Tee without cardioversion N/A 02/08/2014    Procedure: TRANSESOPHAGEAL ECHOCARDIOGRAM (TEE);  Surgeon: Larey Dresser, MD;  Location: [place];  Service: Cardiovascular;  Laterality: N/A;  . Cardiac catheterization  02/18/14  . Left and right heart catheterization with coronary angiogram N/A 02/18/2014    Procedure: LEFT AND RIGHT HEART CATHETERIZATION WITH CORONARY ANGIOGRAM;  Surgeon: Larey Dresser, MD;  Location: [facility] CATH LAB;  Service: Cardiovascular;  Laterality: N/A;    SOCIAL HISTORY:   Social History   Social History  . Marital Status: Married    Spouse Name: Journalist, newspaper  . Number of Children: 3  . Years of Education:  N/A   Occupational History  . Not on file.   Social History Main Topics  . Smoking status: Never Smoker   . Smokeless tobacco: Never Used  . Alcohol Use: No  . Drug Use: No  . Sexual Activity: Not on file   Other Topics Concern  . Not on file   Social History Narrative    FAMILY HISTORY:   Family Status  Relation Status Death Age  . Father Deceased     Emphysema  . Mother Deceased     CHF  . Sister Alive     heart disease  . Brother Alive     COPD  . Sister Alive     COPD    ROS:  Admits to shortness of breath from asthma.  Admits to stress from caregiving for her husband with Alzheimer's disease.  Admits to neck pain, but states that it is significantly better since an epidural injection.  She is contemplating surgery and has a follow-up appointment with Dr. Arnoldo Morale.  A complete 10 system review of systems was obtained and was unremarkable apart from what is mentioned above.  PHYSICAL EXAMINATION:    VITALS:   Filed Vitals:   09/19/15 1340  BP: 126/54  Pulse: 71  Height: 5\' 5"  (1.651 m)  Weight: 176 lb (79.833 kg)  SpO2: 90%    GEN:  The patient appears stated age and is in NAD.  There is an unpleasant odor in the room. HEENT:  Normocephalic, atraumatic.  The mucous membranes are moist. The superficial temporal arteries are without ropiness or tenderness. CV:  RRR Lungs:  CTAB.  She is visibly short of breath.  This gets worse with exertion. Neck/HEME:  There are no carotid bruits bilaterally.  Neurological examination:  Orientation: The patient is alert and oriented x3. Fund of knowledge is appropriate.  Recent and remote memory are intact.  Attention and concentration are normal.  Able to name objects and repeat phrases. Cranial nerves: There is good facial symmetry.  There is significant facial hypomimia.  Pupils are equal round and reactive to light bilaterally. Fundoscopic exam reveals clear margins bilaterally. Extraocular muscles are intact. The  visual fields are full to confrontational testing. The speech is fluent and clear.  She is hypophonic.  Soft palate rises symmetrically and there is no tongue deviation. Hearing is intact to conversational tone. Sensation: Sensation is intact to light and pinprick throughout (facial, trunk, extremities). Vibration is decreased at the bilateral big toe. There is no extinction with double simultaneous stimulation. There is no sensory dermatomal level identified. Motor: Strength is 5/5 in the bilateral upper and lower extremities.   Shoulder shrug is equal and symmetric.  There is no pronator drift. Deep tendon reflexes: Deep tendon reflexes are 2/4 at the bilateral biceps, triceps, brachioradialis, patella and 1/4 at the bilateral achilles. Plantar responses are downgoing bilaterally.  Movement examination: Tone: There is no rigidity today (took medication one hr before).  Tone in the left upper extremity was normal.   Abnormal movements: There is a mild right upper extremity resting tremor and RLE resting tremor Coordination:  There is no significant decremation today. Gait and Station: The patient gets out of the chair with the walker well.  She shuffles minimally.    ASSESSMENT/PLAN:  1.  Idiopathic Parkinson's disease.  While this is just diagnosed in 06/2015, she has had documented sx's since at least 07/2013.    -She is to go back up to carbidopa/levodopa 25/100 1-1/2 tablets 3 times a day and change the dosing to 7 AM/11 AM/4 PM.  I told her that last visit but she didn't do that when she got out of the SNF.    -long discussion with the patient and daughter.  Concerned about her home situation.  I don't want her and her husband who has AD living alone and I talked with the patient and daughter about this.  Told them to check into [place] services as they have [employer] for life.  I think that they need to go to SNF.   Asked her daughter to start having pharmacy prepare med/pill box. 2.  Dysphagia  -She  did have a modified barium swallow on 08/05/2015.  This was normal.  It was noted that her respiratory status may allow for episodic aspiration due to discoordination of swallow/apneic period 3.  Shortness of breath  -This is going to significantly limit her ability to participate with therapy.   4.  Follow up is anticipated in the next few months, sooner should new neurologic issues arise.  Will do MoCA at that visit.  Much greater than 50% of this visit was spent in counseling with the patient and the family.  Total face to face time:  40 min

## 2015-09-29 DIAGNOSIS — I10 Essential (primary) hypertension: Secondary | ICD-10-CM | POA: Diagnosis not present

## 2015-09-29 DIAGNOSIS — M6281 Muscle weakness (generalized): Secondary | ICD-10-CM | POA: Diagnosis not present

## 2015-09-29 DIAGNOSIS — R2689 Other abnormalities of gait and mobility: Secondary | ICD-10-CM | POA: Diagnosis not present

## 2015-09-29 DIAGNOSIS — G20 Parkinson's disease: Secondary | ICD-10-CM | POA: Diagnosis not present

## 2015-09-29 DIAGNOSIS — S130XXD Traumatic rupture of cervical intervertebral disc, subsequent encounter: Secondary | ICD-10-CM | POA: Diagnosis not present

## 2015-09-29 DIAGNOSIS — Z9181 History of falling: Secondary | ICD-10-CM | POA: Diagnosis not present

## 2015-10-03 ENCOUNTER — Telehealth: Payer: Self-pay | Admitting: *Deleted

## 2015-10-03 DIAGNOSIS — I10 Essential (primary) hypertension: Secondary | ICD-10-CM

## 2015-10-03 MED ORDER — AMLODIPINE BESYLATE 5 MG PO TABS: 5.0000 mg | ORAL_TABLET | Freq: Every day | ORAL | Status: DC

## 2015-10-03 NOTE — Telephone Encounter (Signed)
Received call pt states she has made appt to see md 3/15, but she is needing her Norvasc sent to new pharmacy. Verified pharnacy sending 30 day to [facility]...[name]

## 2015-10-06 DIAGNOSIS — M542 Cervicalgia: Secondary | ICD-10-CM | POA: Diagnosis not present

## 2015-10-06 DIAGNOSIS — G20 Parkinson's disease: Secondary | ICD-10-CM | POA: Diagnosis not present

## 2015-10-06 DIAGNOSIS — M791 Myalgia: Secondary | ICD-10-CM | POA: Diagnosis not present

## 2015-10-08 ENCOUNTER — Emergency Department (HOSPITAL_COMMUNITY)
Admission: EM | Admit: 2015-10-08 | Discharge: 2015-10-09 | Disposition: A | Discharge status: Home / Self Care | Payer: Medicare Other | Attending: Emergency Medicine | Admitting: Emergency Medicine

## 2015-10-08 ENCOUNTER — Encounter (HOSPITAL_COMMUNITY): Payer: Self-pay

## 2015-10-08 DIAGNOSIS — F419 Anxiety disorder, unspecified: Secondary | ICD-10-CM | POA: Insufficient documentation

## 2015-10-08 DIAGNOSIS — E785 Hyperlipidemia, unspecified: Secondary | ICD-10-CM | POA: Insufficient documentation

## 2015-10-08 DIAGNOSIS — I10 Essential (primary) hypertension: Secondary | ICD-10-CM | POA: Diagnosis not present

## 2015-10-08 DIAGNOSIS — K219 Gastro-esophageal reflux disease without esophagitis: Secondary | ICD-10-CM | POA: Diagnosis not present

## 2015-10-08 DIAGNOSIS — M1990 Unspecified osteoarthritis, unspecified site: Secondary | ICD-10-CM | POA: Diagnosis not present

## 2015-10-08 DIAGNOSIS — D649 Anemia, unspecified: Secondary | ICD-10-CM | POA: Diagnosis not present

## 2015-10-08 DIAGNOSIS — Z79899 Other long term (current) drug therapy: Secondary | ICD-10-CM | POA: Insufficient documentation

## 2015-10-08 DIAGNOSIS — Z8701 Personal history of pneumonia (recurrent): Secondary | ICD-10-CM | POA: Insufficient documentation

## 2015-10-08 DIAGNOSIS — G8929 Other chronic pain: Secondary | ICD-10-CM | POA: Diagnosis not present

## 2015-10-08 DIAGNOSIS — Z9889 Other specified postprocedural states: Secondary | ICD-10-CM | POA: Insufficient documentation

## 2015-10-08 DIAGNOSIS — M542 Cervicalgia: Secondary | ICD-10-CM | POA: Insufficient documentation

## 2015-10-08 DIAGNOSIS — Z8673 Personal history of transient ischemic attack (TIA), and cerebral infarction without residual deficits: Secondary | ICD-10-CM | POA: Insufficient documentation

## 2015-10-08 DIAGNOSIS — Z880 Allergy status to penicillin: Secondary | ICD-10-CM | POA: Insufficient documentation

## 2015-10-08 DIAGNOSIS — E039 Hypothyroidism, unspecified: Secondary | ICD-10-CM | POA: Insufficient documentation

## 2015-10-08 DIAGNOSIS — Z87440 Personal history of urinary (tract) infections: Secondary | ICD-10-CM | POA: Diagnosis not present

## 2015-10-08 DIAGNOSIS — J449 Chronic obstructive pulmonary disease, unspecified: Secondary | ICD-10-CM | POA: Insufficient documentation

## 2015-10-08 DIAGNOSIS — F329 Major depressive disorder, single episode, unspecified: Secondary | ICD-10-CM | POA: Diagnosis not present

## 2015-10-08 DIAGNOSIS — Z7982 Long term (current) use of aspirin: Secondary | ICD-10-CM | POA: Insufficient documentation

## 2015-10-08 MED ORDER — MORPHINE SULFATE (PF) 4 MG/ML IV SOLN
4.0000 mg | Freq: Once | INTRAVENOUS | Status: AC
  Administered 2015-10-08: 4 mg via INTRAVENOUS
  Filled 2015-10-08: qty 1

## 2015-10-08 MED ORDER — DIAZEPAM 5 MG/ML IJ SOLN
2.5000 mg | Freq: Once | INTRAMUSCULAR | Status: AC
  Administered 2015-10-08: 2.5 mg via INTRAVENOUS
  Filled 2015-10-08: qty 2

## 2015-10-08 MED ORDER — ONDANSETRON HCL 4 MG/2ML IJ SOLN
4.0000 mg | Freq: Once | INTRAMUSCULAR | Status: AC
  Administered 2015-10-08: 4 mg via INTRAVENOUS
  Filled 2015-10-08: qty 2

## 2015-10-08 NOTE — Discharge Instructions (Signed)
Continue follow-up with your regular physicians regarding your chronic neck pain.

## 2015-10-08 NOTE — ED Notes (Signed)
Patient is sleeping with daughter at the bedside.  Started to have O2 saturation in the high 80s.  Applied a nasal cannula at 2 Lpm. Saturation rose to 98 quickly.  Will continue to monitor.

## 2015-10-08 NOTE — ED Notes (Signed)
Patient presented with family member complaining of pain in the neck, also stating that she hurts all over.  Cannot tell what is hurting.  States "I hurt all over"

## 2015-10-08 NOTE — ED Provider Notes (Signed)
CSN: [id number]     Arrival date & time 10/08/15  1945 History   First MD Initiated Contact with Patient 10/08/15 2052     Chief Complaint  Patient presents with  . Neck Pain    unable to eat associated with neck pain       HPI  She presents for evaluation of neck pain.  History of chronic neck pain. Intimately gets epidural steroid injections. Follows with neurosurgery. Presents today stating that she feels "very tense". Her daughter states that  The patient's husband has Alzheimer's and "they bicker a lot". Patient states she is stressed out a lot. States at times she'll get spasm and discomfort in her neck and gets very anxious. Presents here with an episode tonight. No numbness weakness tingling or actually symptoms.  Past Medical History  Diagnosis Date  . History of sudden visual loss   . Acute cystitis   . Bronchitis, acute   . Hypertension   . Cerebrovascular disease   . Hypothyroidism   . GERD (gastroesophageal reflux disease)   . Diverticulosis of colon   . DJD (degenerative joint disease)   . Back pain   . Transient ischemic attack   . Anxiety   . Depression   . Asthma   . COPD (chronic obstructive pulmonary disease) ([place])   . Shortness of breath     with exertion   . Pneumonia 2015    hx of x 2   . Stroke [facility])     2010 partial blind in left eye   . UTI (lower urinary tract infection)     hx of   . Carotid artery occlusion   . Hyperlipidemia   . Aortic stenosis, mild 11/18/2013  . Pulmonary hypertension, moderate to severe ([place]) 11/19/2014  . Anemia   . Fall 07/05/2015   Past Surgical History  Procedure Laterality Date  . Vaginal hysterectomy    . Colectomy    . Tubal ligation    . Replacement total knee      right and left knee  . Cardiothoracic procedure  01/13/2009    Right   . Foot surgery      nerve cut between toes   . Endarterectomy      right carotid endartarectomy - 2010  . Total knee arthroplasty Left 11/17/2012    Procedure: TOTAL KNEE  ARTHROPLASTY;  Surgeon: Gearlean Alf, MD;  [facility];  Service: Orthopedics;  Laterality: Left;  . Tee without cardioversion N/A 02/08/2014    Procedure: TRANSESOPHAGEAL ECHOCARDIOGRAM (TEE);  Surgeon: Larey Dresser, MD;  Location: [place];  Service: Cardiovascular;  Laterality: N/A;  . Cardiac catheterization  02/18/14  . Left and right heart catheterization with coronary angiogram N/A 02/18/2014    Procedure: LEFT AND RIGHT HEART CATHETERIZATION WITH CORONARY ANGIOGRAM;  Surgeon: Larey Dresser, MD;  Location: [facility] CATH LAB;  Service: Cardiovascular;  Laterality: N/A;   Family History  Problem Relation Age of Onset  . Emphysema Father   . Heart disease Mother   . Hyperlipidemia Mother   . Hypertension Mother   . Coronary artery disease Sister   . Heart disease Sister   . Hypertension Sister   . Varicose Veins Sister   . AAA (abdominal aortic aneurysm) Sister   . Rheumatic fever Brother     x2  . Heart attack Brother    Social History  Substance Use Topics  . Smoking status: Never Smoker   . Smokeless tobacco: Never Used  .  Alcohol Use: No   OB History    No data available     Review of Systems  Constitutional: Negative for fever, chills, diaphoresis, appetite change and fatigue.  HENT: Negative for mouth sores, sore throat and trouble swallowing.   Eyes: Negative for visual disturbance.  Respiratory: Negative for cough, chest tightness, shortness of breath and wheezing.   Cardiovascular: Negative for chest pain.  Gastrointestinal: Negative for nausea, vomiting, abdominal pain, diarrhea and abdominal distention.  Endocrine: Negative for polydipsia, polyphagia and polyuria.  Genitourinary: Negative for dysuria, frequency and hematuria.  Musculoskeletal: Positive for neck pain. Negative for gait problem.  Skin: Negative for color change, pallor and rash.  Neurological: Negative for dizziness, syncope, light-headedness and headaches.  Hematological: Does not  bruise/bleed easily.  Psychiatric/Behavioral: Negative for behavioral problems and confusion.      Allergies  Relafen; Lipitor; Other; and Penicillins  Home Medications   Prior to Admission medications   Medication Sig Start Date End Date Taking? Authorizing Provider  ADVAIR HFA 230-21 MCG/ACT inhaler Inhale 2 puffs into the lungs 2 (two) times daily. 11/23/14   Biagio Borg, MD  albuterol (PROVENTIL HFA;VENTOLIN HFA) 108 (90 BASE) MCG/ACT inhaler Inhale 2 puffs into the lungs every 6 (six) hours as needed for wheezing or shortness of breath. 11/19/14   Biagio Borg, MD  amLODipine (NORVASC) 5 MG tablet Take 1 tablet (5 mg total) by mouth daily. Must keep appt for future refills 10/03/15   Biagio Borg, MD  aspirin EC 81 MG tablet Take 1 tablet (81 mg total) by mouth daily. 01/26/14   Larey Dresser, MD  carbidopa-levodopa (SINEMET IR) 25-100 MG tablet 1 1/2 tab by mouth three times daily 7a, 11a, 4 p for parkinson's disease    Historical Provider, MD  Cranberry-Vitamin C-Probiotic (AZO CRANBERRY) 250-30 MG TABS Take 1 capsule by mouth daily.    Historical Provider, MD  diazepam (VALIUM) 2 MG tablet 1/2 - 1 tab by mouth twice per day as needed 07/09/15   Kelvin Cellar, MD  escitalopram (LEXAPRO) 10 MG tablet Take 1 tablet (10 mg total) by mouth daily. 05/20/14   Biagio Borg, MD  ferrous sulfate 325 (65 FE) MG tablet Take 325 mg by mouth daily with breakfast.    Historical Provider, MD  fish oil-omega-3 fatty acids 1000 MG capsule Take 1 g by mouth daily.     Historical Provider, MD  levothyroxine (SYNTHROID, LEVOTHROID) 50 MCG tablet Take 1 tablet (50 mcg total) by mouth daily. 11/19/14   Biagio Borg, MD  methocarbamol (ROBAXIN) 500 MG tablet Take 1 tablet (500 mg total) by mouth 2 (two) times daily as needed for muscle spasms. 06/28/15   Noemi Chapel, MD  Nutritional Supplements (OSTEO ADVANCE) TABS Take 2 tablets by mouth daily.     Historical Provider, MD  omeprazole (PRILOSEC) 20 MG  capsule Take 1 capsule (20 mg total) by mouth daily. 08/12/14   Biagio Borg, MD  ondansetron (ZOFRAN) 4 MG tablet Take 1 tablet (4 mg total) by mouth every 6 (six) hours as needed for nausea or vomiting. [id number]   Delora Fuel, MD  oxyCODONE-acetaminophen (PERCOCET) 5-325 MG tablet Take 1 tablet by mouth every 4 (four) hours as needed for moderate pain. [id number]   Delora Fuel, MD  pravastatin (PRAVACHOL) 20 MG tablet Take 1 tablet (20 mg total) by mouth daily at 6 PM. 07/09/15   Kelvin Cellar, MD   BP 142/60 mmHg  Pulse 66  Temp(Src) 97.7  F (36.5 C) (Oral)  Resp 16  SpO2 97% Physical Exam  Constitutional: She is oriented to person, place, and time. She appears well-developed and well-nourished. No distress.  Very anxious  HENT:  Head: Normocephalic.  Eyes: Conjunctivae are normal. Pupils are equal, round, and reactive to light. No scleral icterus.  Neck: Normal range of motion. Neck supple. No thyromegaly present.    Cardiovascular: Normal rate and regular rhythm.  Exam reveals no gallop and no friction rub.   No murmur heard. Pulmonary/Chest: Effort normal and breath sounds normal. No respiratory distress. She has no wheezes. She has no rales.  Abdominal: Soft. Bowel sounds are normal. She exhibits no distension. There is no tenderness. There is no rebound.  Musculoskeletal: Normal range of motion.  Neurological: She is alert and oriented to person, place, and time.  Skin: Skin is warm and dry. No rash noted.  Psychiatric: She has a normal mood and affect. Her behavior is normal.    ED Course  Procedures (including critical care time) Labs Review Labs Reviewed - No data to display  Imaging Review No results found. I have personally reviewed and evaluated these images and lab results as part of my medical decision-making.   EKG Interpretation None      MDM   Final diagnoses:  Neck pain    Patient somewhat sedate after medications. Will reevaluate. Short observation  more awake and very likely be appropriate for discharge to follow-up with her primary care physician.    Tanna Furry, MD 10/08/15 2252

## 2015-10-12 ENCOUNTER — Ambulatory Visit: Payer: Medicare Other | Admitting: Internal Medicine

## 2015-10-13 ENCOUNTER — Encounter (HOSPITAL_COMMUNITY): Payer: Self-pay | Admitting: *Deleted

## 2015-10-13 ENCOUNTER — Emergency Department (HOSPITAL_COMMUNITY)
Admission: EM | Admit: 2015-10-13 | Discharge: 2015-10-13 | Disposition: A | Discharge status: Home / Self Care | Payer: Medicare Other | Source: Home / Self Care | Attending: Emergency Medicine | Admitting: Emergency Medicine

## 2015-10-13 ENCOUNTER — Emergency Department (HOSPITAL_COMMUNITY): Payer: Medicare Other

## 2015-10-13 DIAGNOSIS — E785 Hyperlipidemia, unspecified: Secondary | ICD-10-CM | POA: Insufficient documentation

## 2015-10-13 DIAGNOSIS — I10 Essential (primary) hypertension: Secondary | ICD-10-CM | POA: Insufficient documentation

## 2015-10-13 DIAGNOSIS — F0280 Dementia in other diseases classified elsewhere without behavioral disturbance: Secondary | ICD-10-CM | POA: Diagnosis present

## 2015-10-13 DIAGNOSIS — E039 Hypothyroidism, unspecified: Secondary | ICD-10-CM

## 2015-10-13 DIAGNOSIS — K219 Gastro-esophageal reflux disease without esophagitis: Secondary | ICD-10-CM | POA: Diagnosis present

## 2015-10-13 DIAGNOSIS — F329 Major depressive disorder, single episode, unspecified: Secondary | ICD-10-CM | POA: Insufficient documentation

## 2015-10-13 DIAGNOSIS — D649 Anemia, unspecified: Secondary | ICD-10-CM | POA: Diagnosis present

## 2015-10-13 DIAGNOSIS — Z87828 Personal history of other (healed) physical injury and trauma: Secondary | ICD-10-CM

## 2015-10-13 DIAGNOSIS — B961 Klebsiella pneumoniae [K. pneumoniae] as the cause of diseases classified elsewhere: Secondary | ICD-10-CM | POA: Diagnosis present

## 2015-10-13 DIAGNOSIS — J449 Chronic obstructive pulmonary disease, unspecified: Secondary | ICD-10-CM | POA: Diagnosis present

## 2015-10-13 DIAGNOSIS — Z96652 Presence of left artificial knee joint: Secondary | ICD-10-CM | POA: Diagnosis present

## 2015-10-13 DIAGNOSIS — I272 Other secondary pulmonary hypertension: Secondary | ICD-10-CM | POA: Diagnosis present

## 2015-10-13 DIAGNOSIS — Z9889 Other specified postprocedural states: Secondary | ICD-10-CM

## 2015-10-13 DIAGNOSIS — R252 Cramp and spasm: Secondary | ICD-10-CM

## 2015-10-13 DIAGNOSIS — Z87448 Personal history of other diseases of urinary system: Secondary | ICD-10-CM

## 2015-10-13 DIAGNOSIS — M1990 Unspecified osteoarthritis, unspecified site: Secondary | ICD-10-CM

## 2015-10-13 DIAGNOSIS — G309 Alzheimer's disease, unspecified: Secondary | ICD-10-CM | POA: Diagnosis present

## 2015-10-13 DIAGNOSIS — G9341 Metabolic encephalopathy: Secondary | ICD-10-CM | POA: Diagnosis present

## 2015-10-13 DIAGNOSIS — R079 Chest pain, unspecified: Secondary | ICD-10-CM | POA: Insufficient documentation

## 2015-10-13 DIAGNOSIS — Z79899 Other long term (current) drug therapy: Secondary | ICD-10-CM

## 2015-10-13 DIAGNOSIS — Z9049 Acquired absence of other specified parts of digestive tract: Secondary | ICD-10-CM

## 2015-10-13 DIAGNOSIS — Z8701 Personal history of pneumonia (recurrent): Secondary | ICD-10-CM

## 2015-10-13 DIAGNOSIS — Z7982 Long term (current) use of aspirin: Secondary | ICD-10-CM

## 2015-10-13 DIAGNOSIS — H5442 Blindness, left eye, normal vision right eye: Secondary | ICD-10-CM | POA: Diagnosis present

## 2015-10-13 DIAGNOSIS — Z7951 Long term (current) use of inhaled steroids: Secondary | ICD-10-CM | POA: Insufficient documentation

## 2015-10-13 DIAGNOSIS — I350 Nonrheumatic aortic (valve) stenosis: Secondary | ICD-10-CM | POA: Diagnosis present

## 2015-10-13 DIAGNOSIS — R0789 Other chest pain: Secondary | ICD-10-CM | POA: Diagnosis not present

## 2015-10-13 DIAGNOSIS — R000 Tachycardia, unspecified: Secondary | ICD-10-CM | POA: Diagnosis present

## 2015-10-13 DIAGNOSIS — F419 Anxiety disorder, unspecified: Secondary | ICD-10-CM | POA: Insufficient documentation

## 2015-10-13 DIAGNOSIS — Z87440 Personal history of urinary (tract) infections: Secondary | ICD-10-CM | POA: Insufficient documentation

## 2015-10-13 DIAGNOSIS — Z8673 Personal history of transient ischemic attack (TIA), and cerebral infarction without residual deficits: Secondary | ICD-10-CM

## 2015-10-13 DIAGNOSIS — Z90710 Acquired absence of both cervix and uterus: Secondary | ICD-10-CM

## 2015-10-13 DIAGNOSIS — G20 Parkinson's disease: Secondary | ICD-10-CM | POA: Diagnosis present

## 2015-10-13 DIAGNOSIS — N390 Urinary tract infection, site not specified: Principal | ICD-10-CM | POA: Diagnosis present

## 2015-10-13 DIAGNOSIS — Z880 Allergy status to penicillin: Secondary | ICD-10-CM | POA: Insufficient documentation

## 2015-10-13 DIAGNOSIS — Z8249 Family history of ischemic heart disease and other diseases of the circulatory system: Secondary | ICD-10-CM

## 2015-10-13 LAB — CBC
HEMATOCRIT: 32.8 % — AB (ref 36.0–46.0)
HEMOGLOBIN: 10.9 g/dL — AB (ref 12.0–15.0)
MCH: 32.4 pg (ref 26.0–34.0)
MCHC: 33.2 g/dL (ref 30.0–36.0)
MCV: 97.6 fL (ref 78.0–100.0)
Platelets: 258 10*3/uL (ref 150–400)
RBC: 3.36 MIL/uL — ABNORMAL LOW (ref 3.87–5.11)
RDW: 12.3 % (ref 11.5–15.5)
WBC: 5.5 10*3/uL (ref 4.0–10.5)

## 2015-10-13 LAB — TROPONIN I: Troponin I: <0.03 ng/mL (ref <0.031)

## 2015-10-13 LAB — I-STAT TROPONIN, ED: TROPONIN I, POC: 0.01 ng/mL (ref 0.00–0.08)

## 2015-10-13 LAB — BASIC METABOLIC PANEL
ANION GAP: 11 (ref 5–15)
BUN: 21 mg/dL — AB (ref 6–20)
CALCIUM: 9.8 mg/dL (ref 8.9–10.3)
CO2: 25 mmol/L (ref 22–32)
Chloride: 107 mmol/L (ref 101–111)
Creatinine, Ser: 1.25 mg/dL — ABNORMAL HIGH (ref 0.44–1.00)
GFR calc Af Amer: 46 mL/min — ABNORMAL LOW (ref >60)
GFR, EST NON AFRICAN AMERICAN: 40 mL/min — AB (ref >60)
Glucose, Bld: 110 mg/dL — ABNORMAL HIGH (ref 65–99)
POTASSIUM: 3.9 mmol/L (ref 3.5–5.1)
SODIUM: 143 mmol/L (ref 135–145)

## 2015-10-13 LAB — CBG MONITORING, ED: GLUCOSE-CAPILLARY: 99 mg/dL (ref 65–99)

## 2015-10-13 MED ORDER — MORPHINE SULFATE (PF) 2 MG/ML IV SOLN: 2.0000 mg | Freq: Once | INTRAVENOUS | Status: DC

## 2015-10-13 MED ORDER — DIAZEPAM 2 MG PO TABS
2.0000 mg | ORAL_TABLET | Freq: Once | ORAL | Status: AC
  Administered 2015-10-13: 2 mg via ORAL
  Filled 2015-10-13: qty 1

## 2015-10-13 NOTE — ED Provider Notes (Signed)
CSN: [id number]     Arrival date & time 10/13/15  1005 History   First MD Initiated Contact with Patient 10/13/15 1029     Chief Complaint  Patient presents with  . Chest Pain     (Consider location/radiation/quality/duration/timing/severity/associated sxs/prior Treatment) Patient is a 81 y.o. female presenting with chest pain. The history is provided by the patient. No language interpreter was used.  Chest Pain Pain location:  Unable to specify Pain quality: aching   Pain radiates to:  Does not radiate Pain radiates to the back: no   Pain severity:  No pain Onset quality:  Unable to specify Duration:  1 day Timing:  Constant Progression:  Worsening Chronicity:  New Relieved by:  Nothing Worsened by:  Nothing tried Associated symptoms: no headache, no lower extremity edema and no numbness   Risk factors: hypertension   Pt reports she has pain in her legs.  Pt reports she has chronic leg cramps.   Pt denies current chest pain.  Pt reports she thinks she had chest pain this am but she mainly came in for her legs.   Pt reports she did not take her medications for her leg pain.  Past Medical History  Diagnosis Date  . History of sudden visual loss   . Acute cystitis   . Bronchitis, acute   . Hypertension   . Cerebrovascular disease   . Hypothyroidism   . GERD (gastroesophageal reflux disease)   . Diverticulosis of colon   . DJD (degenerative joint disease)   . Back pain   . Transient ischemic attack   . Anxiety   . Depression   . Asthma   . COPD (chronic obstructive pulmonary disease) ([place])   . Shortness of breath     with exertion   . Pneumonia 2015    hx of x 2   . 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 UTI (lower urinary tract infection)     hx of   . Carotid artery occlusion   . Hyperlipidemia   . Aortic stenosis, mild 11/18/2013  . Pulmonary hypertension, moderate to severe ([place]) 11/19/2014  . Anemia   . Fall 07/05/2015   Past Surgical History   Procedure Laterality Date  . Vaginal hysterectomy    . Colectomy    . Tubal ligation    . Replacement total knee      right and left knee  . Cardiothoracic procedure  01/13/2009    Right   . Foot surgery      nerve cut between toes   . Endarterectomy      right carotid endartarectomy - 2010  . Total knee arthroplasty Left 11/17/2012    Procedure: TOTAL KNEE ARTHROPLASTY;  Surgeon: Gearlean Alf, MD;  Location: [facility];  Service: Orthopedics;  Laterality: Left;  . Tee without cardioversion N/A 02/08/2014    Procedure: TRANSESOPHAGEAL ECHOCARDIOGRAM (TEE);  Surgeon: Larey Dresser, MD;  Location: [facility];  Service: Cardiovascular;  Laterality: N/A;  . Cardiac catheterization  02/18/14  . Left and right heart catheterization with coronary angiogram N/A 02/18/2014    Procedure: LEFT AND RIGHT HEART CATHETERIZATION WITH CORONARY ANGIOGRAM;  Surgeon: Larey Dresser, MD;  Location: [facility] C[facility] LAB;  Service: Cardiovascular;  Laterality: N/A;   Family History  Problem Relation Age of Onset  . Emphysema Father   . Heart disease Mother   . Hyperlipidemia Mother   . Hypertension Mother   . Coronary  artery disease Sister   . Heart disease Sister   . Hypertension Sister   . Varicose Veins Sister   . AAA (abdominal aortic aneurysm) Sister   . Rheumatic fever Brother     x2  . Heart attack Brother    Social History  Substance Use Topics  . Smoking status: Never Smoker   . Smokeless tobacco: Never Used  . Alcohol Use: No   OB History    No data available     Review of Systems  Cardiovascular: Positive for chest pain.  Neurological: Negative for numbness and headaches.  All other systems reviewed and are negative.     Allergies  Relafen; Lipitor; Other; and Penicillins  Home Medications   Prior to Admission medications   Medication Sig Start Date End Date Taking? Authorizing Provider  ADVAIR HFA 230-21 MCG/ACT inhaler Inhale 2 puffs into the lungs 2 (two) times daily.  11/23/14   Biagio Borg, MD  albuterol (PROVENTIL HFA;VENTOLIN HFA) 108 (90 BASE) MCG/ACT inhaler Inhale 2 puffs into the lungs every 6 (six) hours as needed for wheezing or shortness of breath. 11/19/14   Biagio Borg, MD  amLODipine (NORVASC) 5 MG tablet Take 1 tablet (5 mg total) by mouth daily. Must keep appt for future refills 10/03/15   Biagio Borg, MD  aspirin EC 81 MG tablet Take 1 tablet (81 mg total) by mouth daily. 01/26/14   Larey Dresser, MD  carbidopa-levodopa (SINEMET IR) 25-100 MG tablet 1 1/2 tab by mouth three times daily 7a, 11a, 4 p for parkinson's disease    Historical Provider, MD  Cranberry-Vitamin C-Probiotic (AZO CRANBERRY) 250-30 MG TABS Take 1 capsule by mouth daily.    Historical Provider, MD  diazepam (VALIUM) 2 MG tablet 1/2 - 1 tab by mouth twice per day as needed 07/09/15   Kelvin Cellar, MD  escitalopram (LEXAPRO) 10 MG tablet Take 1 tablet (10 mg total) by mouth daily. 05/20/14   Biagio Borg, MD  ferrous sulfate 325 (65 FE) MG tablet Take 325 mg by mouth daily with breakfast.    Historical Provider, MD  fish oil-omega-3 fatty acids 1000 MG capsule Take 1 g by mouth daily.     Historical Provider, MD  levothyroxine (SYNTHROID, LEVOTHROID) 50 MCG tablet Take 1 tablet (50 mcg total) by mouth daily. 11/19/14   Biagio Borg, MD  methocarbamol (ROBAXIN) 500 MG tablet Take 1 tablet (500 mg total) by mouth 2 (two) times daily as needed for muscle spasms. 06/28/15   Noemi Chapel, MD  Nutritional Supplements (OSTEO ADVANCE) TABS Take 2 tablets by mouth daily.     Historical Provider, MD  omeprazole (PRILOSEC) 20 MG capsule Take 1 capsule (20 mg total) by mouth daily. 08/12/14   Biagio Borg, MD  ondansetron (ZOFRAN) 4 MG tablet Take 1 tablet (4 mg total) by mouth every 6 (six) hours as needed for nausea or vomiting. [id number]   Delora Fuel, MD  oxyCODONE-acetaminophen (PERCOCET) 5-325 MG tablet Take 1 tablet by mouth every 4 (four) hours as needed for moderate pain. [id number]    Delora Fuel, MD  pravastatin (PRAVACHOL) 20 MG tablet Take 1 tablet (20 mg total) by mouth daily at 6 PM. 07/09/15   Kelvin Cellar, MD   BP 155/56 mmHg  Pulse 84  Temp(Src) 98.2 F (36.8 C) (Oral)  Resp 20  Ht 5\' 5"  (1.651 m)  Wt 78.472 kg  BMI 28.79 kg/m2  SpO2 100% Physical Exam  Constitutional: She is  oriented to person, place, and time. She appears well-developed and well-nourished.  HENT:  Head: Normocephalic.  Eyes: EOM are normal.  Neck: Normal range of motion.  Pulmonary/Chest: Effort normal.  Abdominal: She exhibits no distension.  Musculoskeletal: Normal range of motion.  Neurological: She is alert and oriented to person, place, and time.  Psychiatric: She has a normal mood and affect.  Nursing note and vitals reviewed.   ED Course  Procedures (including critical care time) Labs Review Labs Reviewed  BASIC METABOLIC PANEL - Abnormal; Notable for the following:    Glucose, Bld 110 (*)    BUN 21 (*)    Creatinine, Ser 1.25 (*)    GFR calc non Af Amer 40 (*)    GFR calc Af Amer 46 (*)    All other components within normal limits  CBC - Abnormal; Notable for the following:    RBC 3.36 (*)    Hemoglobin 10.9 (*)    HCT 32.8 (*)    All other components within normal limits  I-STAT TROPOININ, ED  CBG MONITORING, ED    Imaging Review Dg Chest 2 View  10/13/2015  CLINICAL DATA:  RIGHT-sided chest pain, leg spasms and ankle swelling, increased falls, hypertension, valvular heart disease, Parkinson's, asthma, COPD, pulmonary hypertension EXAM: CHEST  2 VIEW COMPARISON:  11/19/2014 FINDINGS: Normal heart size, mediastinal contours, and pulmonary vascularity. Atherosclerotic calcification aorta. Emphysematous and bronchitic changes consistent with COPD. No acute infiltrate, pleural effusion or pneumothorax. Bones demineralized. IMPRESSION: COPD changes. No acute abnormalities. Electronically Signed   By: Lavonia Dana M.D.   On: 10/13/2015 11:19   I have personally  reviewed and evaluated these images and lab results as part of my medical decision-making.   EKG Interpretation None     Normal sinus,81 no acute st changes, normal qrs, normal pr.  MDM Family concerned that patient is not doing well at home.  Pt was doing better while she was receiving PT at home.  Social Worker saw pt here and set up pt for pt and home health assessment.   Pt given valium 2mg  for cramps.   Morphine given Iv for pain.    Final diagnoses:  Chest pain, unspecified chest pain type  Cramp of both lower extremities       Fransico Meadow, PA-C 10/13/15 1452  Milton Ferguson, MD 10/13/15 1622

## 2015-10-13 NOTE — ED Notes (Signed)
Pt arrives from home via [facility] c/o right chest pain that begin this morning around 6am radiating to her neck. Pt c/o bilateral leg pain. EMS gave 4mg  of Morphine, 324mg  of ASA, and nitro x1 PTA.

## 2015-10-13 NOTE — Discharge Instructions (Signed)

## 2015-10-13 NOTE — Care Management Note (Signed)
Case Management Note  Patient Details  Name: [name] MRN: [id number] Date of Birth: [birth year]  Subjective/Objective:                  81 y.o. female presenting with chest pain. //Home with spouse; has supportive children.  Action/Plan: Follow for disposition needs; set up home health services.   Expected Discharge Date:       10/13/15           Expected Discharge Plan:  [place]  In-House Referral:  NA  Discharge planning Services  CM Consult  Post Acute Care Choice:  Home Health Choice offered to:  Patient  DME Arranged:  N/A DME Agency:  NA  HH Arranged:  RN, PT, Social Work [employer] Agency:  [place]  Status of Service:  Completed, signed off  Medicare Important Message Given:    Date Medicare IM Given:    Medicare IM give by:    Date Additional Medicare IM Given:    Additional Medicare Important Message give by:     If discussed at [place] of Stay Meetings, dates discussed:    Additional Comments: Wilbur Labuda J. Clydene Laming, RN, BSN, [employer] [phone] Spoke with pt at bedside regarding discharge planning for [facility]. Offered pt list of home health agencies to choose from.  Pt chose [facility] to render services. Danny Lawless of [facility] notified.  No DME needs identified at this time.  Fuller Mandril, RN 10/13/2015, 1:56 PM

## 2015-10-13 NOTE — ED Notes (Signed)
Pt a/o x4. NAD noted. D/C paper reviewed with pt and family.

## 2015-10-16 ENCOUNTER — Inpatient Hospital Stay (HOSPITAL_COMMUNITY)
Admission: EM | Admit: 2015-10-16 | Discharge: 2015-10-20 | DRG: 689 | Disposition: A | Discharge status: Skilled Nursing Facility | Payer: Medicare Other | Attending: Internal Medicine | Admitting: Internal Medicine

## 2015-10-16 ENCOUNTER — Emergency Department (HOSPITAL_COMMUNITY): Payer: Medicare Other

## 2015-10-16 ENCOUNTER — Encounter (HOSPITAL_COMMUNITY): Payer: Self-pay

## 2015-10-16 DIAGNOSIS — G9341 Metabolic encephalopathy: Secondary | ICD-10-CM | POA: Diagnosis not present

## 2015-10-16 DIAGNOSIS — I350 Nonrheumatic aortic (valve) stenosis: Secondary | ICD-10-CM | POA: Diagnosis present

## 2015-10-16 DIAGNOSIS — N390 Urinary tract infection, site not specified: Secondary | ICD-10-CM

## 2015-10-16 DIAGNOSIS — E039 Hypothyroidism, unspecified: Secondary | ICD-10-CM | POA: Diagnosis present

## 2015-10-16 DIAGNOSIS — I10 Essential (primary) hypertension: Secondary | ICD-10-CM | POA: Diagnosis present

## 2015-10-16 DIAGNOSIS — G20 Parkinson's disease: Secondary | ICD-10-CM | POA: Diagnosis present

## 2015-10-16 DIAGNOSIS — Z9049 Acquired absence of other specified parts of digestive tract: Secondary | ICD-10-CM | POA: Diagnosis not present

## 2015-10-16 DIAGNOSIS — K760 Fatty (change of) liver, not elsewhere classified: Secondary | ICD-10-CM | POA: Diagnosis not present

## 2015-10-16 DIAGNOSIS — Z8249 Family history of ischemic heart disease and other diseases of the circulatory system: Secondary | ICD-10-CM | POA: Diagnosis not present

## 2015-10-16 DIAGNOSIS — R109 Unspecified abdominal pain: Secondary | ICD-10-CM

## 2015-10-16 DIAGNOSIS — J449 Chronic obstructive pulmonary disease, unspecified: Secondary | ICD-10-CM | POA: Diagnosis present

## 2015-10-16 DIAGNOSIS — R404 Transient alteration of awareness: Secondary | ICD-10-CM | POA: Diagnosis not present

## 2015-10-16 DIAGNOSIS — R000 Tachycardia, unspecified: Secondary | ICD-10-CM | POA: Diagnosis present

## 2015-10-16 DIAGNOSIS — R531 Weakness: Secondary | ICD-10-CM | POA: Diagnosis not present

## 2015-10-16 DIAGNOSIS — Z90710 Acquired absence of both cervix and uterus: Secondary | ICD-10-CM | POA: Diagnosis not present

## 2015-10-16 DIAGNOSIS — B961 Klebsiella pneumoniae [K. pneumoniae] as the cause of diseases classified elsewhere: Secondary | ICD-10-CM | POA: Diagnosis present

## 2015-10-16 DIAGNOSIS — R262 Difficulty in walking, not elsewhere classified: Secondary | ICD-10-CM | POA: Diagnosis not present

## 2015-10-16 DIAGNOSIS — H5442 Blindness, left eye, normal vision right eye: Secondary | ICD-10-CM | POA: Diagnosis present

## 2015-10-16 DIAGNOSIS — Z96652 Presence of left artificial knee joint: Secondary | ICD-10-CM | POA: Diagnosis present

## 2015-10-16 DIAGNOSIS — D539 Nutritional anemia, unspecified: Secondary | ICD-10-CM | POA: Diagnosis present

## 2015-10-16 DIAGNOSIS — K8020 Calculus of gallbladder without cholecystitis without obstruction: Secondary | ICD-10-CM | POA: Diagnosis not present

## 2015-10-16 DIAGNOSIS — F0280 Dementia in other diseases classified elsewhere without behavioral disturbance: Secondary | ICD-10-CM | POA: Diagnosis present

## 2015-10-16 DIAGNOSIS — F419 Anxiety disorder, unspecified: Secondary | ICD-10-CM

## 2015-10-16 DIAGNOSIS — R41841 Cognitive communication deficit: Secondary | ICD-10-CM | POA: Diagnosis not present

## 2015-10-16 DIAGNOSIS — F329 Major depressive disorder, single episode, unspecified: Secondary | ICD-10-CM | POA: Diagnosis present

## 2015-10-16 DIAGNOSIS — F32A Depression, unspecified: Secondary | ICD-10-CM | POA: Diagnosis present

## 2015-10-16 DIAGNOSIS — R300 Dysuria: Secondary | ICD-10-CM | POA: Diagnosis not present

## 2015-10-16 DIAGNOSIS — R1311 Dysphagia, oral phase: Secondary | ICD-10-CM | POA: Diagnosis not present

## 2015-10-16 DIAGNOSIS — Z7982 Long term (current) use of aspirin: Secondary | ICD-10-CM | POA: Diagnosis not present

## 2015-10-16 DIAGNOSIS — D649 Anemia, unspecified: Secondary | ICD-10-CM | POA: Diagnosis not present

## 2015-10-16 DIAGNOSIS — E785 Hyperlipidemia, unspecified: Secondary | ICD-10-CM | POA: Diagnosis present

## 2015-10-16 DIAGNOSIS — G309 Alzheimer's disease, unspecified: Secondary | ICD-10-CM

## 2015-10-16 DIAGNOSIS — I272 Other secondary pulmonary hypertension: Secondary | ICD-10-CM | POA: Diagnosis present

## 2015-10-16 DIAGNOSIS — K219 Gastro-esophageal reflux disease without esophagitis: Secondary | ICD-10-CM | POA: Diagnosis present

## 2015-10-16 DIAGNOSIS — F418 Other specified anxiety disorders: Secondary | ICD-10-CM | POA: Diagnosis not present

## 2015-10-16 DIAGNOSIS — M6281 Muscle weakness (generalized): Secondary | ICD-10-CM | POA: Diagnosis not present

## 2015-10-16 DIAGNOSIS — Z79899 Other long term (current) drug therapy: Secondary | ICD-10-CM | POA: Diagnosis not present

## 2015-10-16 LAB — URINALYSIS, ROUTINE W REFLEX MICROSCOPIC
BILIRUBIN URINE: NEGATIVE
Glucose, UA: NEGATIVE mg/dL
KETONES UR: 15 mg/dL — AB
NITRITE: POSITIVE — AB
PH: 5.5 (ref 5.0–8.0)
PROTEIN: NEGATIVE mg/dL
Specific Gravity, Urine: 1.013 (ref 1.005–1.030)

## 2015-10-16 LAB — COMPREHENSIVE METABOLIC PANEL
ALBUMIN: 3.9 g/dL (ref 3.5–5.0)
ALT: 25 U/L (ref 14–54)
ANION GAP: 10 (ref 5–15)
AST: 46 U/L — ABNORMAL HIGH (ref 15–41)
Alkaline Phosphatase: 60 U/L (ref 38–126)
BILIRUBIN TOTAL: 0.8 mg/dL (ref 0.3–1.2)
BUN: 23 mg/dL — ABNORMAL HIGH (ref 6–20)
CHLORIDE: 108 mmol/L (ref 101–111)
CO2: 23 mmol/L (ref 22–32)
Calcium: 9.4 mg/dL (ref 8.9–10.3)
Creatinine, Ser: 1.08 mg/dL — ABNORMAL HIGH (ref 0.44–1.00)
GFR calc Af Amer: 55 mL/min — ABNORMAL LOW (ref >60)
GFR calc non Af Amer: 48 mL/min — ABNORMAL LOW (ref >60)
GLUCOSE: 114 mg/dL — AB (ref 65–99)
POTASSIUM: 4.5 mmol/L (ref 3.5–5.1)
SODIUM: 141 mmol/L (ref 135–145)
TOTAL PROTEIN: 7.3 g/dL (ref 6.5–8.1)

## 2015-10-16 LAB — CBC WITH DIFFERENTIAL/PLATELET
BASOS ABS: 0 10*3/uL (ref 0.0–0.1)
BASOS PCT: 0 %
EOS ABS: 0 10*3/uL (ref 0.0–0.7)
EOS PCT: 0 %
HCT: 35.7 % — ABNORMAL LOW (ref 36.0–46.0)
Hemoglobin: 11.8 g/dL — ABNORMAL LOW (ref 12.0–15.0)
Lymphocytes Relative: 8 %
Lymphs Abs: 0.6 10*3/uL — ABNORMAL LOW (ref 0.7–4.0)
MCH: 32.9 pg (ref 26.0–34.0)
MCHC: 33.1 g/dL (ref 30.0–36.0)
MCV: 99.4 fL (ref 78.0–100.0)
MONO ABS: 0.9 10*3/uL (ref 0.1–1.0)
Monocytes Relative: 10 %
Neutro Abs: 6.8 10*3/uL (ref 1.7–7.7)
Neutrophils Relative %: 82 %
PLATELETS: 267 10*3/uL (ref 150–400)
RBC: 3.59 MIL/uL — AB (ref 3.87–5.11)
RDW: 13.7 % (ref 11.5–15.5)
WBC: 8.3 10*3/uL (ref 4.0–10.5)

## 2015-10-16 LAB — URINE MICROSCOPIC-ADD ON

## 2015-10-16 LAB — TROPONIN I

## 2015-10-16 LAB — LIPASE, BLOOD: Lipase: 20 U/L (ref 11–51)

## 2015-10-16 MED ORDER — DEXTROSE 5 % IV SOLN
1.0000 g | Freq: Once | INTRAVENOUS | Status: AC
Start: 2015-10-16 — End: 2015-10-16
  Administered 2015-10-16: 1 g via INTRAVENOUS
  Filled 2015-10-16: qty 10

## 2015-10-16 NOTE — ED Notes (Signed)
Per [facility] patients family reports gen bilateral weakness.  Patient normally able to walk to restroom unassisted and family reports that had to 2 person assist her.  Patient has Hx of stroke, Hx of dementia, Hx of UTI.  Patients family reports that patient has episodes of these same Sx's when she has a UTI.  VS en route BP 152/70, PR 96, RR 22, Temp 99-temporal.  Per EMS EKG unremarkable, patient on 2L O2 for comfort per EMS.

## 2015-10-16 NOTE — ED Notes (Signed)
Bed: [id number] Expected date: 10/16/15 Expected time: 8:37 PM Means of arrival: Ambulance Comments: 81 yo F  General weakness

## 2015-10-16 NOTE — ED Provider Notes (Signed)
CSN: [id number]     Arrival date & time 10/16/15  2053 History   First MD Initiated Contact with Patient 10/16/15 2119     Chief Complaint  Patient presents with  . Weakness     Patient is a 81 y.o. female presenting with weakness. The history is provided by the patient and a relative. No language interpreter was used.  Weakness   [name] is a 81 y.o. female who presents to the Emergency Department complaining of weakness.  Level V caveat due to confusion.  Hx is provided by the patient and her daughter.  According to her daughter that patient has had increased weakness and fatigue for the last 2 days. She has a history of Parkinson's but at her baseline she ambulates with a walker. She has had difficulty moving her arms and legs since yesterday. Today she has been unable to even lift her arms and cannot feed herself or ambulate. No reports of fevers, chest pain, abdominal pain, vomiting. The patient endorses dysuria and nausea. She has chronic neck pain.  Sxs are severe, constant, worsening .   Past Medical History  Diagnosis Date  . History of sudden visual loss   . Acute cystitis   . Bronchitis, acute   . Hypertension   . Cerebrovascular disease   . Hypothyroidism   . GERD (gastroesophageal reflux disease)   . Diverticulosis of colon   . DJD (degenerative joint disease)   . Back pain   . Transient ischemic attack   . Anxiety   . Depression   . Asthma   . COPD (chronic obstructive pulmonary disease) ([place])   . Shortness of breath     with exertion   . Pneumonia 2015    hx of x 2   . 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 UTI (lower urinary tract infection)     hx of   . Carotid artery occlusion   . Hyperlipidemia   . Aortic stenosis, mild 11/18/2013  . Pulmonary hypertension, moderate to severe ([place]) 11/19/2014  . Anemia   . Fall 07/05/2015   Past Surgical History  Procedure Laterality Date  . Vaginal hysterectomy    . Colectomy    . Tubal ligation     . Replacement total knee      right and left knee  . Cardiothoracic procedure  01/13/2009    Right   . Foot surgery      nerve cut between toes   . Endarterectomy      right carotid endartarectomy - 2010  . Total knee arthroplasty Left 11/17/2012    Procedure: TOTAL KNEE ARTHROPLASTY;  Surgeon: Gearlean Alf, MD;  Location: [facility];  Service: Orthopedics;  Laterality: Left;  . Tee without cardioversion N/A 02/08/2014    Procedure: TRANSESOPHAGEAL ECHOCARDIOGRAM (TEE);  Surgeon: Larey Dresser, MD;  Location: [facility];  Service: Cardiovascular;  Laterality: N/A;  . Cardiac catheterization  02/18/14  . Left and right heart catheterization with coronary angiogram N/A 02/18/2014    Procedure: LEFT AND RIGHT HEART CATHETERIZATION WITH CORONARY ANGIOGRAM;  Surgeon: Larey Dresser, MD;  Location: [facility] CATH LAB;  Service: Cardiovascular;  Laterality: N/A;   Family History  Problem Relation Age of Onset  . Emphysema Father   . Heart disease Mother   . Hyperlipidemia Mother   . Hypertension Mother   . Coronary artery disease Sister   . Heart disease Sister   . Hypertension Sister   .  Varicose Veins Sister   . AAA (abdominal aortic aneurysm) Sister   . Rheumatic fever Brother     x2  . Heart attack Brother    Social History  Substance Use Topics  . Smoking status: Never Smoker   . Smokeless tobacco: Never Used  . Alcohol Use: No   OB History    No data available     Review of Systems  Neurological: Positive for weakness.  All other systems reviewed and are negative.     Allergies  Relafen; Lipitor; Other; and Penicillins  Home Medications   Prior to Admission medications   Medication Sig Start Date End Date Taking? Authorizing Provider  ADVAIR HFA 230-21 MCG/ACT inhaler Inhale 2 puffs into the lungs 2 (two) times daily. 11/23/14   Biagio Borg, MD  albuterol (PROVENTIL HFA;VENTOLIN HFA) 108 (90 BASE) MCG/ACT inhaler Inhale 2 puffs into the lungs every 6 (six) hours as  needed for wheezing or shortness of breath. 11/19/14   Biagio Borg, MD  amLODipine (NORVASC) 5 MG tablet Take 1 tablet (5 mg total) by mouth daily. Must keep appt for future refills 10/03/15   Biagio Borg, MD  aspirin EC 81 MG tablet Take 1 tablet (81 mg total) by mouth daily. 01/26/14   Larey Dresser, MD  carbidopa-levodopa (SINEMET IR) 25-100 MG tablet 1 1/2 tab by mouth three times daily 7a, 11a, 4 p for parkinson's disease    Historical Provider, MD  Cranberry-Vitamin C-Probiotic (AZO CRANBERRY) 250-30 MG TABS Take 1 capsule by mouth daily.    Historical Provider, MD  diazepam (VALIUM) 2 MG tablet 1/2 - 1 tab by mouth twice per day as needed 07/09/15   Kelvin Cellar, MD  escitalopram (LEXAPRO) 10 MG tablet Take 1 tablet (10 mg total) by mouth daily. 05/20/14   Biagio Borg, MD  ferrous sulfate 325 (65 FE) MG tablet Take 325 mg by mouth daily with breakfast.    Historical Provider, MD  fish oil-omega-3 fatty acids 1000 MG capsule Take 1 g by mouth daily.     Historical Provider, MD  levothyroxine (SYNTHROID, LEVOTHROID) 50 MCG tablet Take 1 tablet (50 mcg total) by mouth daily. 11/19/14   Biagio Borg, MD  methocarbamol (ROBAXIN) 500 MG tablet Take 1 tablet (500 mg total) by mouth 2 (two) times daily as needed for muscle spasms. 06/28/15   Noemi Chapel, MD  Nutritional Supplements (OSTEO ADVANCE) TABS Take 2 tablets by mouth daily.     Historical Provider, MD  omeprazole (PRILOSEC) 20 MG capsule Take 1 capsule (20 mg total) by mouth daily. 08/12/14   Biagio Borg, MD  ondansetron (ZOFRAN) 4 MG tablet Take 1 tablet (4 mg total) by mouth every 6 (six) hours as needed for nausea or vomiting. [id number]   Delora Fuel, MD  oxyCODONE-acetaminophen (PERCOCET) 5-325 MG tablet Take 1 tablet by mouth every 4 (four) hours as needed for moderate pain. [id number]   Delora Fuel, MD  pravastatin (PRAVACHOL) 20 MG tablet Take 1 tablet (20 mg total) by mouth daily at 6 PM. 07/09/15   Kelvin Cellar, MD   BP 155/65  mmHg  Pulse 99  Temp(Src) 98 F (36.7 C) (Oral)  Resp 24  SpO2 100% Physical Exam  Constitutional: She appears well-developed.  Chronically ill-appearing  HENT:  Head: Normocephalic and atraumatic.  Eyes: Pupils are equal, round, and reactive to light.  Cardiovascular: Normal rate and regular rhythm.   No murmur heard. Pulmonary/Chest: Effort normal and breath sounds  normal. No respiratory distress.  Abdominal: Soft. There is no tenderness. There is no rebound and no guarding.  Musculoskeletal: She exhibits no edema or tenderness.  Neurological:  Sleepy but arouses to verbal stimuli. Oriented to place and time. Profound weakness. She can wiggle her fingers and toes and lift her right leg slightly off the stretcher. She can weakly squeeze in bilateral hands. Resting tremor of bilateral feet.  Skin: Skin is warm and dry.  Psychiatric: She has a normal mood and affect. Her behavior is normal.  Nursing note and vitals reviewed.   ED Course  Procedures (including critical care time) Labs Review Labs Reviewed  CBC WITH DIFFERENTIAL/PLATELET - Abnormal; Notable for the following:    RBC 3.59 (*)    Hemoglobin 11.8 (*)    HCT 35.7 (*)    Lymphs Abs 0.6 (*)    All other components within normal limits  URINALYSIS, ROUTINE W REFLEX MICROSCOPIC (NOT AT [facility]) - Abnormal; Notable for the following:    APPearance CLOUDY (*)    Hgb urine dipstick TRACE (*)    Ketones, ur 15 (*)    Nitrite POSITIVE (*)    Leukocytes, UA LARGE (*)    All other components within normal limits  COMPREHENSIVE METABOLIC PANEL - Abnormal; Notable for the following:    Glucose, Bld 114 (*)    BUN 23 (*)    Creatinine, Ser 1.08 (*)    AST 46 (*)    GFR calc non Af Amer 48 (*)    GFR calc Af Amer 55 (*)    All other components within normal limits  URINE MICROSCOPIC-ADD ON - Abnormal; Notable for the following:    Squamous Epithelial / LPF 0-5 (*)    Bacteria, UA MANY (*)    All other components within  normal limits  URINE CULTURE  TROPONIN I  LIPASE, BLOOD    Imaging Review Ct Head Wo Contrast  10/16/2015  CLINICAL DATA:  Weakness, onset tonight. EXAM: CT HEAD WITHOUT CONTRAST CT CERVICAL SPINE WITHOUT CONTRAST TECHNIQUE: Multidetector CT imaging of the head and cervical spine was performed following the standard protocol without intravenous contrast. Multiplanar CT image reconstructions of the cervical spine were also generated. COMPARISON:  07/07/2015 FINDINGS: CT HEAD FINDINGS There is no intracranial hemorrhage, mass or evidence of acute infarction. There is moderate generalized atrophy. There is moderate chronic microvascular ischemic change. There is no significant extra-axial fluid collection. No acute intracranial findings are evident. No interval change is evident from 07/07/2015. No bony abnormalities are evident. Visible paranasal sinuses are clear. CT CERVICAL SPINE FINDINGS The vertebral column, pedicles and facet articulations are intact. There is no evidence of acute fracture. No acute soft tissue abnormalities are evident. Moderately severe degenerative cervical disc disease is present from C3 through C7 with prominent osteophytes. Probable degenerative central canal stenosis at C4-5 and C5-6. IMPRESSION: 1. Negative for acute intracranial traumatic injury. There is moderate generalized atrophy and chronic microvascular white matter changes. 2. Negative for acute cervical spine fracture. Degenerative central canal stenosis in the midcervical spine. Electronically Signed   By: Andreas Newport M.D.   On: 10/16/2015 22:52   Ct Cervical Spine Wo Contrast  10/16/2015  CLINICAL DATA:  Weakness, onset tonight. EXAM: CT HEAD WITHOUT CONTRAST CT CERVICAL SPINE WITHOUT CONTRAST TECHNIQUE: Multidetector CT imaging of the head and cervical spine was performed following the standard protocol without intravenous contrast. Multiplanar CT image reconstructions of the cervical spine were also  generated. COMPARISON:  07/07/2015 FINDINGS: CT  HEAD FINDINGS There is no intracranial hemorrhage, mass or evidence of acute infarction. There is moderate generalized atrophy. There is moderate chronic microvascular ischemic change. There is no significant extra-axial fluid collection. No acute intracranial findings are evident. No interval change is evident from 07/07/2015. No bony abnormalities are evident. Visible paranasal sinuses are clear. CT CERVICAL SPINE FINDINGS The vertebral column, pedicles and facet articulations are intact. There is no evidence of acute fracture. No acute soft tissue abnormalities are evident. Moderately severe degenerative cervical disc disease is present from C3 through C7 with prominent osteophytes. Probable degenerative central canal stenosis at C4-5 and C5-6. IMPRESSION: 1. Negative for acute intracranial traumatic injury. There is moderate generalized atrophy and chronic microvascular white matter changes. 2. Negative for acute cervical spine fracture. Degenerative central canal stenosis in the midcervical spine. Electronically Signed   By: Andreas Newport M.D.   On: 10/16/2015 22:52   Dg Chest Port 1 View  10/16/2015  CLINICAL DATA:  Initial evaluation for generalized weakness. EXAM: PORTABLE CHEST 1 VIEW COMPARISON:  Prior radiograph from 10/13/2015. FINDINGS: The cardiac and mediastinal silhouettes are stable in size and contour, and remain within normal limits. Mild atheromatous plaque at the aortic arch. Mild prominence of the right main PA, stable. The lungs are normally inflated. Changes related COPD noted. No airspace consolidation, pleural effusion, or pulmonary edema is identified. Curvilinear lucency overlying the right lung consistent with a skin fold. No pneumothorax. No acute osseous abnormality identified. IMPRESSION: No active disease. Electronically Signed   By: Jeannine Boga M.D.   On: 10/16/2015 22:41   I have personally reviewed and evaluated  these images and lab results as part of my medical decision-making.   EKG Interpretation   Date/Time:  Sunday October 16 2015 21:58:32 EDT Ventricular Rate:  107 PR Interval:  165 QRS Duration: 95 QT Interval:  335 QTC Calculation: 447 R Axis:   91 Text Interpretation:  Sinus tachycardia Multiple premature complexes, vent  & supraven Right axis deviation artifact present that limits  interpretation Confirmed by Hazle Coca [phone]) on 10/16/2015 10:03:35 PM      MDM   Final diagnoses:  Acute UTI   Patient with history of Parkinson's disease here with profound generalized weakness, dysuria. UA consistent with UTI. Treating with Rocephin for UTI. Discussed with patient and daughter recommendation for admission given her significant weakness. Daughter thinks that the patient will need placement in rehabilitation following hospitalization given her significant debilitation at this time. List consultation for admission.    Quintella Reichert, MD 10/17/15 [phone]

## 2015-10-17 ENCOUNTER — Inpatient Hospital Stay (HOSPITAL_COMMUNITY): Payer: Medicare Other

## 2015-10-17 ENCOUNTER — Encounter (HOSPITAL_COMMUNITY): Payer: Self-pay | Admitting: Family Medicine

## 2015-10-17 DIAGNOSIS — F0280 Dementia in other diseases classified elsewhere without behavioral disturbance: Secondary | ICD-10-CM | POA: Diagnosis present

## 2015-10-17 DIAGNOSIS — G309 Alzheimer's disease, unspecified: Secondary | ICD-10-CM

## 2015-10-17 DIAGNOSIS — R300 Dysuria: Secondary | ICD-10-CM

## 2015-10-17 DIAGNOSIS — F418 Other specified anxiety disorders: Secondary | ICD-10-CM

## 2015-10-17 DIAGNOSIS — N390 Urinary tract infection, site not specified: Secondary | ICD-10-CM | POA: Diagnosis present

## 2015-10-17 DIAGNOSIS — R531 Weakness: Secondary | ICD-10-CM

## 2015-10-17 DIAGNOSIS — E038 Other specified hypothyroidism: Secondary | ICD-10-CM

## 2015-10-17 DIAGNOSIS — D649 Anemia, unspecified: Secondary | ICD-10-CM

## 2015-10-17 DIAGNOSIS — G9341 Metabolic encephalopathy: Secondary | ICD-10-CM | POA: Diagnosis present

## 2015-10-17 DIAGNOSIS — I10 Essential (primary) hypertension: Secondary | ICD-10-CM

## 2015-10-17 LAB — PROCALCITONIN: Procalcitonin: <0.1 ng/mL

## 2015-10-17 LAB — GLUCOSE, CAPILLARY: GLUCOSE-CAPILLARY: 87 mg/dL (ref 65–99)

## 2015-10-17 LAB — LACTIC ACID, PLASMA
Lactic Acid, Venous: 0.8 mmol/L (ref 0.5–2.0)
Lactic Acid, Venous: 0.8 mmol/L (ref 0.5–2.0)

## 2015-10-17 MED ORDER — OXYCODONE-ACETAMINOPHEN 5-325 MG PO TABS
1.0000 | ORAL_TABLET | ORAL | Status: DC | PRN
  Administered 2015-10-17 – 2015-10-18 (×3): 1 via ORAL
  Filled 2015-10-17 (×3): qty 1

## 2015-10-17 MED ORDER — ONDANSETRON HCL 4 MG PO TABS: 4.0000 mg | ORAL_TABLET | Freq: Four times a day (QID) | ORAL | Status: DC | PRN

## 2015-10-17 MED ORDER — DIAZEPAM 5 MG PO TABS
5.0000 mg | ORAL_TABLET | Freq: Two times a day (BID) | ORAL | Status: DC | PRN
  Administered 2015-10-17 – 2015-10-19 (×4): 5 mg via ORAL
  Filled 2015-10-17 (×5): qty 1

## 2015-10-17 MED ORDER — HYDRALAZINE HCL 20 MG/ML IJ SOLN: 10.0000 mg | INTRAMUSCULAR | Status: DC | PRN

## 2015-10-17 MED ORDER — CARBIDOPA-LEVODOPA 25-100 MG PO TABS
1.5000 | ORAL_TABLET | Freq: Three times a day (TID) | ORAL | Status: DC
  Administered 2015-10-17 – 2015-10-20 (×10): 1.5 via ORAL
  Filled 2015-10-17 (×12): qty 1.5

## 2015-10-17 MED ORDER — LEVOFLOXACIN IN D5W 750 MG/150ML IV SOLN
750.0000 mg | INTRAVENOUS | Status: DC
  Administered 2015-10-17: 750 mg via INTRAVENOUS
  Filled 2015-10-17 (×2): qty 150

## 2015-10-17 MED ORDER — OSTEO ADVANCE PO TABS: 2.0000 | ORAL_TABLET | Freq: Every day | ORAL | Status: DC

## 2015-10-17 MED ORDER — PANTOPRAZOLE SODIUM 40 MG IV SOLR
40.0000 mg | INTRAVENOUS | Status: DC
  Administered 2015-10-17: 40 mg via INTRAVENOUS
  Filled 2015-10-17 (×2): qty 40

## 2015-10-17 MED ORDER — SODIUM CHLORIDE 0.9 % IV SOLN
INTRAVENOUS | Status: DC
  Administered 2015-10-17 (×2): 75 mL/h via INTRAVENOUS

## 2015-10-17 MED ORDER — IOHEXOL 300 MG/ML  SOLN
50.0000 mL | INTRAMUSCULAR | Status: AC
  Administered 2015-10-17: 50 mL via ORAL

## 2015-10-17 MED ORDER — ALBUTEROL SULFATE (2.5 MG/3ML) 0.083% IN NEBU
2.5000 mg | INHALATION_SOLUTION | Freq: Four times a day (QID) | RESPIRATORY_TRACT | Status: DC | PRN
  Administered 2015-10-18: 2.5 mg via RESPIRATORY_TRACT
  Filled 2015-10-17: qty 3

## 2015-10-17 MED ORDER — AMLODIPINE BESYLATE 5 MG PO TABS
5.0000 mg | ORAL_TABLET | Freq: Every day | ORAL | Status: DC
  Administered 2015-10-17 – 2015-10-20 (×4): 5 mg via ORAL
  Filled 2015-10-17 (×4): qty 1

## 2015-10-17 MED ORDER — AZO CRANBERRY 250-30 MG PO TABS: 1.0000 | ORAL_TABLET | Freq: Every day | ORAL | Status: DC

## 2015-10-17 MED ORDER — ALBUTEROL SULFATE HFA 108 (90 BASE) MCG/ACT IN AERS: 2.0000 | INHALATION_SPRAY | Freq: Four times a day (QID) | RESPIRATORY_TRACT | Status: DC | PRN

## 2015-10-17 MED ORDER — ASPIRIN EC 81 MG PO TBEC
81.0000 mg | DELAYED_RELEASE_TABLET | Freq: Every day | ORAL | Status: DC
  Filled 2015-10-17: qty 1

## 2015-10-17 MED ORDER — PRAVASTATIN SODIUM 20 MG PO TABS
20.0000 mg | ORAL_TABLET | Freq: Every day | ORAL | Status: DC
  Administered 2015-10-17 – 2015-10-19 (×3): 20 mg via ORAL
  Filled 2015-10-17 (×4): qty 1

## 2015-10-17 MED ORDER — FERROUS SULFATE 325 (65 FE) MG PO TABS
325.0000 mg | ORAL_TABLET | Freq: Every day | ORAL | Status: DC
  Administered 2015-10-17 – 2015-10-20 (×4): 325 mg via ORAL
  Filled 2015-10-17 (×5): qty 1

## 2015-10-17 MED ORDER — METRONIDAZOLE IN NACL 5-0.79 MG/ML-% IV SOLN
500.0000 mg | Freq: Three times a day (TID) | INTRAVENOUS | Status: DC
  Administered 2015-10-17 – 2015-10-19 (×6): 500 mg via INTRAVENOUS
  Filled 2015-10-17 (×7): qty 100

## 2015-10-17 MED ORDER — DEXTROSE 5 % IV SOLN: 1.0000 g | INTRAVENOUS | Status: DC

## 2015-10-17 MED ORDER — ASPIRIN 81 MG PO CHEW
81.0000 mg | CHEWABLE_TABLET | Freq: Every day | ORAL | Status: DC
  Administered 2015-10-17 – 2015-10-20 (×4): 81 mg via ORAL
  Filled 2015-10-17 (×4): qty 1

## 2015-10-17 MED ORDER — ONDANSETRON HCL 4 MG/2ML IJ SOLN: 4.0000 mg | Freq: Four times a day (QID) | INTRAMUSCULAR | Status: DC | PRN

## 2015-10-17 MED ORDER — LEVOTHYROXINE SODIUM 50 MCG PO TABS
50.0000 ug | ORAL_TABLET | Freq: Every day | ORAL | Status: DC
  Administered 2015-10-17 – 2015-10-20 (×4): 50 ug via ORAL
  Filled 2015-10-17 (×5): qty 1

## 2015-10-17 MED ORDER — MORPHINE SULFATE (PF) 2 MG/ML IV SOLN
1.0000 mg | INTRAVENOUS | Status: DC | PRN
  Administered 2015-10-17 – 2015-10-18 (×9): 1 mg via INTRAVENOUS
  Filled 2015-10-17 (×10): qty 1

## 2015-10-17 MED ORDER — METHOCARBAMOL 500 MG PO TABS
500.0000 mg | ORAL_TABLET | Freq: Two times a day (BID) | ORAL | Status: DC | PRN
  Administered 2015-10-17 – 2015-10-20 (×5): 500 mg via ORAL
  Filled 2015-10-17 (×5): qty 1

## 2015-10-17 MED ORDER — ENOXAPARIN SODIUM 40 MG/0.4ML ~~LOC~~ SOLN
40.0000 mg | SUBCUTANEOUS | Status: DC
  Administered 2015-10-17 – 2015-10-20 (×4): 40 mg via SUBCUTANEOUS
  Filled 2015-10-17 (×4): qty 0.4

## 2015-10-17 MED ORDER — PANTOPRAZOLE SODIUM 40 MG PO TBEC
40.0000 mg | DELAYED_RELEASE_TABLET | Freq: Every day | ORAL | Status: DC
  Filled 2015-10-17: qty 1

## 2015-10-17 MED ORDER — KCL IN DEXTROSE-NACL 20-5-0.9 MEQ/L-%-% IV SOLN
INTRAVENOUS | Status: AC
  Administered 2015-10-17 – 2015-10-18 (×3): via INTRAVENOUS
  Filled 2015-10-17 (×5): qty 1000

## 2015-10-17 MED ORDER — ESCITALOPRAM OXALATE 10 MG PO TABS
10.0000 mg | ORAL_TABLET | Freq: Every day | ORAL | Status: DC
  Administered 2015-10-17 – 2015-10-20 (×4): 10 mg via ORAL
  Filled 2015-10-17 (×4): qty 1

## 2015-10-17 MED ORDER — MOMETASONE FURO-FORMOTEROL FUM 200-5 MCG/ACT IN AERO
2.0000 | INHALATION_SPRAY | Freq: Two times a day (BID) | RESPIRATORY_TRACT | Status: DC
  Administered 2015-10-17 – 2015-10-19 (×6): 2 via RESPIRATORY_TRACT
  Filled 2015-10-17: qty 8.8

## 2015-10-17 MED ORDER — MORPHINE SULFATE (PF) 2 MG/ML IV SOLN
1.0000 mg | Freq: Once | INTRAVENOUS | Status: AC
  Administered 2015-10-17: 1 mg via INTRAVENOUS
  Filled 2015-10-17: qty 1

## 2015-10-17 MED ORDER — IOHEXOL 300 MG/ML  SOLN: 50.0000 mL | Freq: Once | INTRAMUSCULAR | Status: DC | PRN

## 2015-10-17 MED ORDER — IOHEXOL 300 MG/ML  SOLN
75.0000 mL | Freq: Once | INTRAMUSCULAR | Status: AC | PRN
  Administered 2015-10-17: 75 mL via INTRAVENOUS

## 2015-10-17 MED ORDER — BISACODYL 5 MG PO TBEC
5.0000 mg | DELAYED_RELEASE_TABLET | Freq: Every day | ORAL | Status: DC | PRN
Start: 2015-10-17 — End: 2015-10-20

## 2015-10-17 MED ORDER — POLYETHYLENE GLYCOL 3350 17 G PO PACK
17.0000 g | PACK | Freq: Every day | ORAL | Status: DC | PRN
  Administered 2015-10-18: 17 g via ORAL
  Filled 2015-10-17: qty 1

## 2015-10-17 MED ORDER — OMEGA-3-ACID ETHYL ESTERS 1 G PO CAPS
1.0000 g | ORAL_CAPSULE | Freq: Every day | ORAL | Status: DC
  Administered 2015-10-18 – 2015-10-20 (×3): 1 g via ORAL
  Filled 2015-10-17 (×4): qty 1

## 2015-10-17 NOTE — Progress Notes (Signed)
PT Cancellation Note  Patient Details Name: [name] MRN: [id number] DOB: Dec 28, 1934   Cancelled Treatment:    Reason Eval/Treat Not Completed: Pain limiting ability to participate (patient moaning, reports abdominal spasm. Has not had sinemet and patient is rigid and resistive to movement. Wil lreturn and evaluate after Sinemet is given.)   Claretha Cooper 10/17/2015, 10:32 AM Tresa Endo PT [phone]

## 2015-10-17 NOTE — Progress Notes (Signed)
Pharmacy Antibiotic Note  [name] is a 81 y.o. female admitted on 10/16/2015 with UTI.  Pharmacy has been consulted for Ceftriaxone dosing.  Plan: Ceftriaxone 1gm iv q24hr  Height: 5\' 5"  (165.1 cm) Weight: 173 lb (78.472 kg) IBW/kg (Calculated) : 57  Temp (24hrs), Avg:98.7 F (37.1 C), Min:98 F (36.7 C), Max:99.3 F (37.4 C)   Recent Labs Lab 10/13/15 1030 10/16/15 2210 10/16/15 2250 10/17/15 0125  WBC 5.5 8.3  --   --   CREATININE 1.25*  --  1.08*  --   LATICACIDVEN  --   --   --  0.8    Estimated Creatinine Clearance: 44.5 mL/min (by C-G formula based on Cr of 1.08).    Allergies  Allergen Reactions  . Relafen [Nabumetone] Diarrhea and Other (See Comments)    GI / Urinary Bleeding  . Lipitor [Atorvastatin] Other (See Comments)    Causes memory loss  . Other Other (See Comments)    "orvail" unknown:  Causes Bloody stool  . Penicillins Hives, Itching, Swelling and Rash    Has patient had a PCN reaction causing immediate rash, facial/tongue/throat swelling, SOB or lightheadedness with hypotension: no Has patient had a PCN reaction causing severe rash involving mucus membranes or skin necrosis: unknown Has patient had a PCN reaction that required hospitalization no Has patient had a PCN reaction occurring within the last 10 years: no If all of the above answers are "NO", then may proceed with Cephalosporin use.     Antimicrobials this admission: Ceftriaxone 3/19 >>    Thank you for allowing pharmacy to be a part of this patient's care.  Nani Skillern Crowford 10/17/2015 2:11 AM

## 2015-10-17 NOTE — Progress Notes (Signed)
PHARMACY NOTE -  Ceftriaxone consult  Pharmacy has been assisting with dosing of Ceftriaxone for UTI. Dosage remains stable at 1g q24h and need for further dosage adjustment appears unlikely at present since this medication does not require renal dose adjustment.  Will sign off at this time.  Please reconsult if a change in clinical status warrants re-evaluation of dosage.   Hershal Coria, PharmD, BCPS Pager: [phone] 10/17/2015 10:26 AM

## 2015-10-17 NOTE — Progress Notes (Signed)
[facility] PROGRESS NOTE  Lakya Simental [id number] DOB: 1935-03-26 DOA: 10/16/2015 PCP: Cathlean Cower, MD  Summary 10/17/15: I have seen and examined Ms. Allington at bedside in the presence of her daughter&son and reviewed her chart. [name] is a pleasant 81 y.o. female with Parkinson's disease, Alzheimer's dementia, depression, anxiety, remote stroke, essential hypertension and hypothyroidism who presented to the ED with progressive generalized weakness in the last month but worsening over the last week, lower abdominal pain, dysuria and fall. She was found to have acute metabolic encephalopathy apparently resulting from UTI. However, she can barely lift her legs, and her family states that her baseline is being able to walk around with the assistance of a walker and that she can communicate with the family normally although she has seen a gradual decline in terms of Parkinson's disease in the last month. There is question whether she may have aspiration as she has been coughing whenever she drinks water. Urine culture results pending and chest x-ray did not suggest overt consolidation. She had CT of the brain/CT of the neck at the time of admission and these were unrevealing. I'm concerned she may have intra-abdominal pathology. We'll therefore obtain CT abdomen and pelvis, blood cultures, follow urine culture, place Foley catheter, consult speech therapy and change antibiotics to Levaquin/Flagyl. Patient can barely lift her legs and she remains mostly out of it. She will need short-term rehabilitation placement. Plan UTI (urinary tract infection)/Dysuria/Encephalopathy, metabolic/abdominal pain/generalized weakness/abdominal pain  Blood culture  Follow urine culture  CT abdomen and pelvis  Place Foley catheter  Consult speech therapy  Change antibiotics to Levaquin/Flagyl Hypothyroidism  On Synthroid Anxiety and depression/ Parkinson disease (HCC)/Dementia in Alzheimer's  disease  Continue home medications Essential hypertension  BP uncontrolled  Monitor and adjust antihypertensives as needed  Anemia, unspecified  ? Related to hypothyroidism  Monitor hemoglobin       Code Status: Full Code Family Communication: Son and daughter at bedside Disposition Plan: ?SNF later this week   Consultants:  None  Procedures:    Antibiotics:  Ceftriaxone 10/16/2015 10/17/2015>>  Levaquin 10/17/2015>>  Flagyl 10/17/2015>>  HPI/Subjective: "OK", says she is going to the voided urine in the bed  Objective: Filed Vitals:   10/17/15 0435 10/17/15 1025  BP: 146/60 146/55  Pulse: 91 103  Temp: 98.7 F (37.1 C)   Resp: 19     Intake/Output Summary (Last 24 hours) at 10/17/15 1223 Last data filed at 10/17/15 1031  Gross per 24 hour  Intake    940 ml  Output      0 ml  Net    940 ml   Filed Weights   10/17/15 0038  Weight: 78.472 kg (173 lb)    Exam:   General:  Somnolent.  Cardiovascular: S1-S2 normal. No murmurs. Pulse regular.  Respiratory: Good air entry bilaterally. No rhonchi or rales.  Abdomen: Soft and nontender. Normal bowel sounds. No organomegaly.  Musculoskeletal: No pedal edema   Neurological: Barely moving lower extremities and somnolent therefore limited exam.  Data Reviewed: Basic Metabolic Panel:  Recent Labs Lab 10/13/15 1030 10/16/15 2250  NA 143 141  K 3.9 4.5  CL 107 108  CO2 25 23  GLUCOSE 110* 114*  BUN 21* 23*  CREATININE 1.25* 1.08*  CALCIUM 9.8 9.4   Liver Function Tests:  Recent Labs Lab 10/16/15 2250  AST 46*  ALT 25  ALKPHOS 60  BILITOT 0.8  PROT 7.3  ALBUMIN 3.9    Recent Labs Lab 10/16/15  2250  LIPASE 20   No results for input(s): AMMONIA in the last 168 hours. CBC:  Recent Labs Lab 10/13/15 1030 10/16/15 2210  WBC 5.5 8.3  NEUTROABS  --  6.8  HGB 10.9* 11.8*  HCT 32.8* 35.7*  MCV 97.6 99.4  PLT 258 267   Cardiac Enzymes:  Recent Labs Lab 10/13/15 1349  10/16/15 2210  TROPONINI <0.03 <0.03   BNP (last 3 results) No results for input(s): BNP in the last 8760 hours.  ProBNP (last 3 results)  Recent Labs  12/06/14 1421  PROBNP 61.0    CBG:  Recent Labs Lab 10/13/15 1025 10/17/15 0723  GLUCAP 99 87    No results found for this or any previous visit (from the past 240 hour(s)).   Studies: Ct Head Wo Contrast  10/16/2015  CLINICAL DATA:  Weakness, onset tonight. EXAM: CT HEAD WITHOUT CONTRAST CT CERVICAL SPINE WITHOUT CONTRAST TECHNIQUE: Multidetector CT imaging of the head and cervical spine was performed following the standard protocol without intravenous contrast. Multiplanar CT image reconstructions of the cervical spine were also generated. COMPARISON:  07/07/2015 FINDINGS: CT HEAD FINDINGS There is no intracranial hemorrhage, mass or evidence of acute infarction. There is moderate generalized atrophy. There is moderate chronic microvascular ischemic change. There is no significant extra-axial fluid collection. No acute intracranial findings are evident. No interval change is evident from 07/07/2015. No bony abnormalities are evident. Visible paranasal sinuses are clear. CT CERVICAL SPINE FINDINGS The vertebral column, pedicles and facet articulations are intact. There is no evidence of acute fracture. No acute soft tissue abnormalities are evident. Moderately severe degenerative cervical disc disease is present from C3 through C7 with prominent osteophytes. Probable degenerative central canal stenosis at C4-5 and C5-6. IMPRESSION: 1. Negative for acute intracranial traumatic injury. There is moderate generalized atrophy and chronic microvascular white matter changes. 2. Negative for acute cervical spine fracture. Degenerative central canal stenosis in the midcervical spine. Electronically Signed   By: Andreas Newport M.D.   On: 10/16/2015 22:52   Ct Cervical Spine Wo Contrast  10/16/2015  CLINICAL DATA:  Weakness, onset tonight.  EXAM: CT HEAD WITHOUT CONTRAST CT CERVICAL SPINE WITHOUT CONTRAST TECHNIQUE: Multidetector CT imaging of the head and cervical spine was performed following the standard protocol without intravenous contrast. Multiplanar CT image reconstructions of the cervical spine were also generated. COMPARISON:  07/07/2015 FINDINGS: CT HEAD FINDINGS There is no intracranial hemorrhage, mass or evidence of acute infarction. There is moderate generalized atrophy. There is moderate chronic microvascular ischemic change. There is no significant extra-axial fluid collection. No acute intracranial findings are evident. No interval change is evident from 07/07/2015. No bony abnormalities are evident. Visible paranasal sinuses are clear. CT CERVICAL SPINE FINDINGS The vertebral column, pedicles and facet articulations are intact. There is no evidence of acute fracture. No acute soft tissue abnormalities are evident. Moderately severe degenerative cervical disc disease is present from C3 through C7 with prominent osteophytes. Probable degenerative central canal stenosis at C4-5 and C5-6. IMPRESSION: 1. Negative for acute intracranial traumatic injury. There is moderate generalized atrophy and chronic microvascular white matter changes. 2. Negative for acute cervical spine fracture. Degenerative central canal stenosis in the midcervical spine. Electronically Signed   By: Andreas Newport M.D.   On: 10/16/2015 22:52   Dg Chest Port 1 View  10/16/2015  CLINICAL DATA:  Initial evaluation for generalized weakness. EXAM: PORTABLE CHEST 1 VIEW COMPARISON:  Prior radiograph from 10/13/2015. FINDINGS: The cardiac and mediastinal silhouettes are stable in  size and contour, and remain within normal limits. Mild atheromatous plaque at the aortic arch. Mild prominence of the right main PA, stable. The lungs are normally inflated. Changes related COPD noted. No airspace consolidation, pleural effusion, or pulmonary edema is identified.  Curvilinear lucency overlying the right lung consistent with a skin fold. No pneumothorax. No acute osseous abnormality identified. IMPRESSION: No active disease. Electronically Signed   By: Jeannine Boga M.D.   On: 10/16/2015 22:41    Scheduled Meds: . amLODipine  5 mg Oral Daily  . aspirin  81 mg Oral Daily  . carbidopa-levodopa  1.5 tablet Oral TID  . enoxaparin (LOVENOX) injection  40 mg Subcutaneous Q24H  . escitalopram  10 mg Oral Daily  . ferrous sulfate  325 mg Oral Q breakfast  . levofloxacin (LEVAQUIN) IV  750 mg Intravenous Q24H  . levothyroxine  50 mcg Oral QAC breakfast  . mometasone-formoterol  2 puff Inhalation BID  . omega-3 acid ethyl esters  1 g Oral Daily  . pantoprazole (PROTONIX) IV  40 mg Intravenous Q24H  . pravastatin  20 mg Oral q1800   Continuous Infusions: . dextrose 5 % and 0.9 % NaCl with KCl 20 mEq/L       Time spent: 25 minutes    Kadyn Guild  [facility] Pager [phone]. If 7PM-7AM, please contact night-coverage at [email or web address], password [facility] 10/17/2015, 12:23 PM  LOS: 1 day

## 2015-10-17 NOTE — H&P (Signed)
[facility] History and Physical  [name] [id number] DOB: 03-Oct-1934 DOA: 10/16/2015  Referring physician: ED physician PCP: Cathlean Cower, MD  Specialists: Dr. Carles Collet (neurology), Dr. Marigene Ehlers (cardiology), Dr. Lenna Gilford (pulmonology)   Chief Complaint:  Gen weakness, lower abd pain, dysuria   HPI: [name] is a 81 y.o. female with PMH of Parkinson's disease, depression, anxiety, remote stroke, and hypothyroidism who presents to the ED with 2 days of progressive generalized weakness, lower abdominal pain, and dysuria. Patient is accompanied by her daughter who provides much of the history. She had apparently been in her usual state of health, ambulatory without assistance, and until approximately 2 days ago when she was noted to be increasingly weak. She also began to complain of lower abdominal pain and dysuria at that time and her symptoms have progressed over the past 2 days to the point where she required 2 person assist to make it to the bathroom today. Family reports similar symptoms in the past when she's had a urinary tract infection and brought her in for evaluation. Patient's dementia precludes her from giving a meaningful description of her symptoms, but her family has not noticed any vomiting or diarrhea. Her appetite has dropped off over the last 2 days.  In ED, patient was found to be afebrile, saturating well on room air, and with vital signs stable. Chest x-ray was negative for acute cardiopulmonary disease. Urine was analyzed and features many bacteria, large leukocyte, positive nitrite, and too numerous to count white blood cells. CMP is largely unremarkable and CBC features a stable chronic anemia with hemoglobin of 11.8. Troponin is undetectable and EKG features a sinus tachycardia with right axis deviation. Urine was sent for culture and the patient was treated with an empiric dose of Rocephin. CT of the head and C-spine were obtained given the patient's generalized weakness  and reports of a fall a couple weeks ago. Imaging was negative for any acute injury. Patient remained stable in the emergency department and will be admitted for ongoing evaluation and management of generalized weakness and lower abdominal pain secondary to urinary tract infection.  Where does patient live?   At home    Can patient participate in ADLs?   Some   Review of Systems:  Unable to assess ROS secondary to patient's clinical condition with advanced dementia.     Allergy:  Allergies  Allergen Reactions  . Relafen [Nabumetone] Diarrhea and Other (See Comments)    GI / Urinary Bleeding  . Lipitor [Atorvastatin] Other (See Comments)    Causes memory loss  . Other Other (See Comments)    "orvail" unknown:  Causes Bloody stool  . Penicillins Hives, Itching, Swelling and Rash    Has patient had a PCN reaction causing immediate rash, facial/tongue/throat swelling, SOB or lightheadedness with hypotension: no Has patient had a PCN reaction causing severe rash involving mucus membranes or skin necrosis: unknown Has patient had a PCN reaction that required hospitalization no Has patient had a PCN reaction occurring within the last 10 years: no If all of the above answers are "NO", then may proceed with Cephalosporin use.     Past Medical History  Diagnosis Date  . History of sudden visual loss   . Acute cystitis   . Bronchitis, acute   . Hypertension   . Cerebrovascular disease   . Hypothyroidism   . GERD (gastroesophageal reflux disease)   . Diverticulosis of colon   . DJD (degenerative joint disease)   . Back pain   .  Transient ischemic attack   . Anxiety   . Depression   . Asthma   . COPD (chronic obstructive pulmonary disease) ([place])   . Shortness of breath     with exertion   . Pneumonia 2015    hx of x 2   . 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 UTI (lower urinary tract infection)     hx of   . Carotid artery occlusion   . Hyperlipidemia   . Aortic  stenosis, mild 11/18/2013  . Pulmonary hypertension, moderate to severe ([place]) 11/19/2014  . Anemia   . Fall 07/05/2015    Past Surgical History  Procedure Laterality Date  . Vaginal hysterectomy    . Colectomy    . Tubal ligation    . Replacement total knee      right and left knee  . Cardiothoracic procedure  01/13/2009    Right   . Foot surgery      nerve cut between toes   . Endarterectomy      right carotid endartarectomy - 2010  . Total knee arthroplasty Left 11/17/2012    Procedure: TOTAL KNEE ARTHROPLASTY;  Surgeon: Gearlean Alf, MD;  Location: [facility];  Service: Orthopedics;  Laterality: Left;  . Tee without cardioversion N/A 02/08/2014    Procedure: TRANSESOPHAGEAL ECHOCARDIOGRAM (TEE);  Surgeon: Larey Dresser, MD;  Location: [facility];  Service: Cardiovascular;  Laterality: N/A;  . Cardiac catheterization  02/18/14  . Left and right heart catheterization with coronary angiogram N/A 02/18/2014    Procedure: LEFT AND RIGHT HEART CATHETERIZATION WITH CORONARY ANGIOGRAM;  Surgeon: Larey Dresser, MD;  Location: [facility] CATH LAB;  Service: Cardiovascular;  Laterality: N/A;    Social History:  reports that she has never smoked. She has never used smokeless tobacco. She reports that she does not drink alcohol or use illicit drugs.  Family History:  Family History  Problem Relation Age of Onset  . Emphysema Father   . Heart disease Mother   . Hyperlipidemia Mother   . Hypertension Mother   . Coronary artery disease Sister   . Heart disease Sister   . Hypertension Sister   . Varicose Veins Sister   . AAA (abdominal aortic aneurysm) Sister   . Rheumatic fever Brother     x2  . Heart attack Brother      Prior to Admission medications   Medication Sig Start Date End Date Taking? Authorizing Provider  ADVAIR HFA 230-21 MCG/ACT inhaler Inhale 2 puffs into the lungs 2 (two) times daily. 11/23/14   Biagio Borg, MD  albuterol (PROVENTIL HFA;VENTOLIN HFA) 108 (90 BASE) MCG/ACT  inhaler Inhale 2 puffs into the lungs every 6 (six) hours as needed for wheezing or shortness of breath. 11/19/14   Biagio Borg, MD  amLODipine (NORVASC) 5 MG tablet Take 1 tablet (5 mg total) by mouth daily. Must keep appt for future refills 10/03/15   Biagio Borg, MD  aspirin EC 81 MG tablet Take 1 tablet (81 mg total) by mouth daily. 01/26/14   Larey Dresser, MD  carbidopa-levodopa (SINEMET IR) 25-100 MG tablet 1 1/2 tab by mouth three times daily 7a, 11a, 4 p for parkinson's disease    Historical Provider, MD  Cranberry-Vitamin C-Probiotic (AZO CRANBERRY) 250-30 MG TABS Take 1 capsule by mouth daily.    Historical Provider, MD  diazepam (VALIUM) 2 MG tablet 1/2 - 1 tab by mouth twice per day  as needed 07/09/15   Kelvin Cellar, MD  escitalopram (LEXAPRO) 10 MG tablet Take 1 tablet (10 mg total) by mouth daily. 05/20/14   Biagio Borg, MD  ferrous sulfate 325 (65 FE) MG tablet Take 325 mg by mouth daily with breakfast.    Historical Provider, MD  fish oil-omega-3 fatty acids 1000 MG capsule Take 1 g by mouth daily.     Historical Provider, MD  levothyroxine (SYNTHROID, LEVOTHROID) 50 MCG tablet Take 1 tablet (50 mcg total) by mouth daily. 11/19/14   Biagio Borg, MD  methocarbamol (ROBAXIN) 500 MG tablet Take 1 tablet (500 mg total) by mouth 2 (two) times daily as needed for muscle spasms. 06/28/15   Noemi Chapel, MD  Nutritional Supplements (OSTEO ADVANCE) TABS Take 2 tablets by mouth daily.     Historical Provider, MD  omeprazole (PRILOSEC) 20 MG capsule Take 1 capsule (20 mg total) by mouth daily. 08/12/14   Biagio Borg, MD  ondansetron (ZOFRAN) 4 MG tablet Take 1 tablet (4 mg total) by mouth every 6 (six) hours as needed for nausea or vomiting. [id number]   Delora Fuel, MD  oxyCODONE-acetaminophen (PERCOCET) 5-325 MG tablet Take 1 tablet by mouth every 4 (four) hours as needed for moderate pain. [id number]   Delora Fuel, MD  pravastatin (PRAVACHOL) 20 MG tablet Take 1 tablet (20 mg total) by mouth  daily at 6 PM. 07/09/15   Kelvin Cellar, MD    Physical Exam: Filed Vitals:   10/16/15 2059 10/16/15 2100 10/16/15 2121 10/16/15 2322  BP: 148/81   155/65  Pulse: 98   99  Temp: 98 F (36.7 C)     TempSrc: Oral     Resp: 18   24  SpO2: 96% 97% 97% 100%   General: Not in acute respiratory distress, but obvious discomfort HEENT:       Eyes: PERRL, EOMI, no scleral icterus or conjunctival pallor.       ENT: No discharge from the ears or nose, no pharyngeal ulcers, oral mucosa dry, lips cracking.        Neck: No JVD, no bruit, no appreciable mass Heme: No cervical adenopathy, no pallor Cardiac: S1/S2, RRR, soft systolic murmur at LSB, No gallops or rubs. Pulm: Good air movement bilaterally. No rales, wheezing, rhonchi or rubs. Abd: Soft, nondistended, suprapubic tenderness, no rebound pain or gaurding, BS present. Ext: 1+ pitting edema b/l LEs. 2+DP/PT pulse bilaterally. Musculoskeletal: No gross deformity, no red, hot, swollen joints  Skin: No rashes or wounds on exposed surfaces  Neuro: Alert, oriented to person and place only, cranial nerves II-XII grossly intact. No focal findings Psych: Patient is not overtly psychotic.  Labs on Admission:  Basic Metabolic Panel:  Recent Labs Lab 10/13/15 1030 10/16/15 2250  NA 143 141  K 3.9 4.5  CL 107 108  CO2 25 23  GLUCOSE 110* 114*  BUN 21* 23*  CREATININE 1.25* 1.08*  CALCIUM 9.8 9.4   Liver Function Tests:  Recent Labs Lab 10/16/15 2250  AST 46*  ALT 25  ALKPHOS 60  BILITOT 0.8  PROT 7.3  ALBUMIN 3.9    Recent Labs Lab 10/16/15 2250  LIPASE 20   No results for input(s): AMMONIA in the last 168 hours. CBC:  Recent Labs Lab 10/13/15 1030 10/16/15 2210  WBC 5.5 8.3  NEUTROABS  --  6.8  HGB 10.9* 11.8*  HCT 32.8* 35.7*  MCV 97.6 99.4  PLT 258 267   Cardiac Enzymes:  Recent Labs  Lab 10/13/15 1349 10/16/15 2210  TROPONINI <0.03 <0.03    BNP (last 3 results) No results for input(s): BNP in the  last 8760 hours.  ProBNP (last 3 results)  Recent Labs  12/06/14 1421  PROBNP 61.0    CBG:  Recent Labs Lab 10/13/15 1025  GLUCAP 99    Radiological Exams on Admission: Ct Head Wo Contrast  10/16/2015  CLINICAL DATA:  Weakness, onset tonight. EXAM: CT HEAD WITHOUT CONTRAST CT CERVICAL SPINE WITHOUT CONTRAST TECHNIQUE: Multidetector CT imaging of the head and cervical spine was performed following the standard protocol without intravenous contrast. Multiplanar CT image reconstructions of the cervical spine were also generated. COMPARISON:  07/07/2015 FINDINGS: CT HEAD FINDINGS There is no intracranial hemorrhage, mass or evidence of acute infarction. There is moderate generalized atrophy. There is moderate chronic microvascular ischemic change. There is no significant extra-axial fluid collection. No acute intracranial findings are evident. No interval change is evident from 07/07/2015. No bony abnormalities are evident. Visible paranasal sinuses are clear. CT CERVICAL SPINE FINDINGS The vertebral column, pedicles and facet articulations are intact. There is no evidence of acute fracture. No acute soft tissue abnormalities are evident. Moderately severe degenerative cervical disc disease is present from C3 through C7 with prominent osteophytes. Probable degenerative central canal stenosis at C4-5 and C5-6. IMPRESSION: 1. Negative for acute intracranial traumatic injury. There is moderate generalized atrophy and chronic microvascular white matter changes. 2. Negative for acute cervical spine fracture. Degenerative central canal stenosis in the midcervical spine. Electronically Signed   By: Andreas Newport M.D.   On: 10/16/2015 22:52   Ct Cervical Spine Wo Contrast  10/16/2015  CLINICAL DATA:  Weakness, onset tonight. EXAM: CT HEAD WITHOUT CONTRAST CT CERVICAL SPINE WITHOUT CONTRAST TECHNIQUE: Multidetector CT imaging of the head and cervical spine was performed following the standard protocol  without intravenous contrast. Multiplanar CT image reconstructions of the cervical spine were also generated. COMPARISON:  07/07/2015 FINDINGS: CT HEAD FINDINGS There is no intracranial hemorrhage, mass or evidence of acute infarction. There is moderate generalized atrophy. There is moderate chronic microvascular ischemic change. There is no significant extra-axial fluid collection. No acute intracranial findings are evident. No interval change is evident from 07/07/2015. No bony abnormalities are evident. Visible paranasal sinuses are clear. CT CERVICAL SPINE FINDINGS The vertebral column, pedicles and facet articulations are intact. There is no evidence of acute fracture. No acute soft tissue abnormalities are evident. Moderately severe degenerative cervical disc disease is present from C3 through C7 with prominent osteophytes. Probable degenerative central canal stenosis at C4-5 and C5-6. IMPRESSION: 1. Negative for acute intracranial traumatic injury. There is moderate generalized atrophy and chronic microvascular white matter changes. 2. Negative for acute cervical spine fracture. Degenerative central canal stenosis in the midcervical spine. Electronically Signed   By: Andreas Newport M.D.   On: 10/16/2015 22:52   Dg Chest Port 1 View  10/16/2015  CLINICAL DATA:  Initial evaluation for generalized weakness. EXAM: PORTABLE CHEST 1 VIEW COMPARISON:  Prior radiograph from 10/13/2015. FINDINGS: The cardiac and mediastinal silhouettes are stable in size and contour, and remain within normal limits. Mild atheromatous plaque at the aortic arch. Mild prominence of the right main PA, stable. The lungs are normally inflated. Changes related COPD noted. No airspace consolidation, pleural effusion, or pulmonary edema is identified. Curvilinear lucency overlying the right lung consistent with a skin fold. No pneumothorax. No acute osseous abnormality identified. IMPRESSION: No active disease. Electronically Signed    By: Jeannine Boga  M.D.   On: 10/16/2015 22:41    EKG: Independently reviewed.  Abnormal findings:  Sinus tachycardia (107), ventricular premature contractions, right axis deviation  Assessment/Plan  1. UTI  - Presents with dysuria, suprapubic pain, and weakness  - UA is grossly infected  - Prior urine culture on file grew E coli sensitive to all agents tested  - Urine culture is incubating  - Continue with empiric Rocephin while awaiting culture data    2. Generalized weakness  - Attributed to acute UTI, no focal weakness or numbness  - Anticipate resolution with effective UTI tx   3. Anxiety and depression  - Appears to be stable  - Continue home-dose Lexapro and prn Valium   4. Parkinson dementia - Continue Sinemet at home-dose   5. Hypothyroidism  - Appears stable, continue home-dose Synthroid    6. Hypertension - Okay on admission, but rising  - Continue home-dose Norvasc  - Hydralazine 10 mg IVP prn    7. Anemia  - Hgb is 11.8 on admission, consistent with her baseline  - No sign of active blood loss  - Continue iron supplementation   DVT ppx:  SQ Lovenox      Code Status: Full code Family Communication:  Yes, patient's daughter at bed side Disposition Plan: Admit to inpatient   Date of Service 10/17/2015    Vianne Bulls, MD [facility] Pager [phone]  If 7PM-7AM, please contact night-coverage [email or web address] Password [email or web address] 10/17/2015, 12:15 AM

## 2015-10-17 NOTE — Evaluation (Signed)
Physical Therapy Evaluation Patient Details Name: [name] MRN: [id number] DOB: 1934/12/16 Today's Date: 10/17/2015   History of Present Illness  [name] is a 81 y.o. female with PMH of Parkinson's disease, depression, anxiety, remote stroke, and hypothyroidism who presents to the ED 10/16/15 with 2 days of progressive generalized weakness, lower abdominal pain, and dysuria. Had been in her usual state of health, ambulatory without assistance, and until approximately 2 days PTA with  increasingly weak  Clinical Impression  The patient has multiple complaints but did mobilize to sitting and transfer to the recliner with assist of 2 persons. Pt admitted with above diagnosis. Pt currently with functional limitations due to the deficits listed below (see PT Problem List). .si Pt will benefit from skilled PT to increase their independence and safety with mobility to allow discharge to the venue listed below.       Follow Up Recommendations SNF;Supervision/Assistance - 24 hour    Equipment Recommendations  None recommended by PT    Recommendations for Other Services       Precautions / Restrictions Precautions Precautions: Fall      Mobility  Bed Mobility Overal bed mobility: Needs Assistance;+ 2 for safety/equipment Bed Mobility: Rolling;Sidelying to Sit Rolling: Total assist;+2 for physical assistance;+2 for safety/equipment Sidelying to sit: Total assist;+2 for physical assistance;+2 for safety/equipment       General bed mobility comments: multimodal cues, decreased initiation, hand over hand to reach for the rail , assist with flexing the legs. Total assist   Transfers Overall transfer level: Needs assistance Equipment used: Rolling walker (2 wheeled) Transfers: Sit to/from [employer] Transfers Sit to Stand: Max assist;From elevated surface Stand pivot transfers: Max assist       General transfer comment: multimodal cues and encouragement to  participate  in the activity. Patient is self limiting. multimodal cues for standing, assist to take steps enough to turn to get the Recliner behind the patien.  Ambulation/Gait                Stairs            Wheelchair Mobility    Modified Rankin (Stroke Patients Only)       Balance Overall balance assessment: History of Falls;Needs assistance Sitting-balance support: Bilateral upper extremity supported;Feet supported Sitting balance-Leahy Scale: Poor   Postural control: Posterior lean Standing balance support: During functional activity;Bilateral upper extremity supported Standing balance-Leahy Scale: Poor                               Pertinent Vitals/Pain Pain Assessment: Faces Faces Pain Scale: Hurts whole lot Pain Location: R foot, every where she is touched. Also cramps in abdomen Pain Descriptors / Indicators: Aching;Tender;Cramping Pain Intervention(s): Limited activity within patient's tolerance;Monitored during session;Repositioned    Home Living Family/patient expects to be discharged to:: Skilled nursing facility Living Arrangements: Spouse/significant other;Children                    Prior Function Level of Independence: Independent with assistive device(s)         Comments: using 4 wheeled RW until recently with decline. Spouse has dementia     Hand Dominance        Extremity/Trunk Assessment   Upper Extremity Assessment: LUE deficits/detail RUE Deficits / Details: holds in rigid posture, decreased ability to reach for rail, unable to hole the spoon     LUE Deficits / Details: functionally using  more, holds cup,reached for RW with cues.   Lower Extremity Assessment: RLE deficits/detail;LLE deficits/detail RLE Deficits / Details: leg tends to be more extended, requireas assist to flex leg in prep for rolling       Communication   Communication: No difficulties  Cognition Arousal/Alertness: Awake/alert Behavior During  Therapy: Anxious;Flat affect Overall Cognitive Status: Impaired/Different from baseline Area of Impairment: Following commands;Awareness;Safety/judgement;Problem solving       Following Commands: Follows one step commands inconsistently     Problem Solving: Decreased initiation General Comments: requires much   encouragement to participate    General Comments      Exercises        Assessment/Plan    PT Assessment Patient needs continued PT services  PT Diagnosis Difficulty walking;Generalized weakness;Acute pain   PT Problem List Decreased strength;Decreased activity tolerance;Decreased range of motion;Decreased balance;Decreased mobility;Decreased coordination;Decreased knowledge of precautions;Decreased safety awareness;Decreased knowledge of use of DME;Pain  PT Treatment Interventions DME instruction;Gait training;Functional mobility training;Therapeutic activities;Therapeutic exercise;Balance training;Patient/family education   PT Goals (Current goals can be found in the Care Plan section) Acute Rehab PT Goals Patient Stated Goal: to get up PT Goal Formulation: With patient/family Time For Goal Achievement: 10/31/15 Potential to Achieve Goals: Good    Frequency Min 3X/week   Barriers to discharge Decreased caregiver support      Co-evaluation               End of Session Equipment Utilized During Treatment: Gait belt Activity Tolerance: Patient limited by fatigue;Patient limited by pain Patient left: in chair;with call bell/phone within reach;with chair alarm set Nurse Communication: Mobility status;Need for lift equipment         Time: 1203-1235 PT Time Calculation (min) (ACUTE ONLY): 32 min   Charges:   PT Evaluation $PT Eval Low Complexity: 1 Procedure PT Treatments $Therapeutic Activity: 8-22 mins   PT G Codes:        Claretha Cooper 10/17/2015, 1:09 PM Tresa Endo PT [phone]

## 2015-10-18 ENCOUNTER — Ambulatory Visit: Payer: Medicare Other | Admitting: Internal Medicine

## 2015-10-18 LAB — COMPREHENSIVE METABOLIC PANEL
ALBUMIN: 3.3 g/dL — AB (ref 3.5–5.0)
ALK PHOS: 52 U/L (ref 38–126)
ALT: <5 U/L — ABNORMAL LOW (ref 14–54)
ANION GAP: 11 (ref 5–15)
AST: 34 U/L (ref 15–41)
BILIRUBIN TOTAL: 0.6 mg/dL (ref 0.3–1.2)
BUN: 18 mg/dL (ref 6–20)
CALCIUM: 8.5 mg/dL — AB (ref 8.9–10.3)
CO2: 21 mmol/L — ABNORMAL LOW (ref 22–32)
CREATININE: 1.07 mg/dL — AB (ref 0.44–1.00)
Chloride: 106 mmol/L (ref 101–111)
GFR calc Af Amer: 56 mL/min — ABNORMAL LOW (ref >60)
GFR calc non Af Amer: 48 mL/min — ABNORMAL LOW (ref >60)
GLUCOSE: 98 mg/dL (ref 65–99)
Potassium: 3.8 mmol/L (ref 3.5–5.1)
Sodium: 138 mmol/L (ref 135–145)
TOTAL PROTEIN: 6.2 g/dL — AB (ref 6.5–8.1)

## 2015-10-18 LAB — CBC WITH DIFFERENTIAL/PLATELET
BASOS PCT: 0 %
Basophils Absolute: 0 10*3/uL (ref 0.0–0.1)
Eosinophils Absolute: 0 10*3/uL (ref 0.0–0.7)
Eosinophils Relative: 0 %
HEMATOCRIT: 29.6 % — AB (ref 36.0–46.0)
HEMOGLOBIN: 10.2 g/dL — AB (ref 12.0–15.0)
LYMPHS ABS: 0.8 10*3/uL (ref 0.7–4.0)
Lymphocytes Relative: 18 %
MCH: 32.7 pg (ref 26.0–34.0)
MCHC: 34.5 g/dL (ref 30.0–36.0)
MCV: 94.9 fL (ref 78.0–100.0)
MONOS PCT: 18 %
Monocytes Absolute: 0.8 10*3/uL (ref 0.1–1.0)
NEUTROS ABS: 3 10*3/uL (ref 1.7–7.7)
NEUTROS PCT: 64 %
Platelets: 213 10*3/uL (ref 150–400)
RBC: 3.12 MIL/uL — ABNORMAL LOW (ref 3.87–5.11)
RDW: 12.8 % (ref 11.5–15.5)
WBC: 4.7 10*3/uL (ref 4.0–10.5)

## 2015-10-18 LAB — GLUCOSE, CAPILLARY: Glucose-Capillary: 85 mg/dL (ref 65–99)

## 2015-10-18 MED ORDER — HYDROCODONE-ACETAMINOPHEN 5-325 MG PO TABS
1.0000 | ORAL_TABLET | Freq: Four times a day (QID) | ORAL | Status: DC | PRN
  Administered 2015-10-18 – 2015-10-20 (×6): 1 via ORAL
  Filled 2015-10-18 (×7): qty 1

## 2015-10-18 MED ORDER — PANTOPRAZOLE SODIUM 40 MG PO TBEC
40.0000 mg | DELAYED_RELEASE_TABLET | Freq: Every day | ORAL | Status: DC
  Administered 2015-10-18 – 2015-10-20 (×3): 40 mg via ORAL
  Filled 2015-10-18 (×3): qty 1

## 2015-10-18 MED ORDER — LEVOFLOXACIN IN D5W 250 MG/50ML IV SOLN
250.0000 mg | INTRAVENOUS | Status: DC
  Administered 2015-10-18: 250 mg via INTRAVENOUS
  Filled 2015-10-18 (×2): qty 50

## 2015-10-18 NOTE — Progress Notes (Signed)
Physical Therapy Treatment Patient Details Name: [name] MRN: [id number] DOB: [birth year] Today's Date: 10/18/2015    History of Present Illness [name] is a 81 y.o. female with PMH of Parkinson's disease, depression, anxiety, remote stroke, and hypothyroidism who presents to the ED 10/16/15 with 2 days of progressive generalized weakness, lower abdominal pain, and dysuria. Had been in her usual state of health, ambulatory without assistance, and until approximately 2 days PTA with  increasingly weak    PT Comments    The patient is progressing slowly, continues to require 2 person assist for transfers.  Recommend OT consult for ADL's and adaptive eating equipment.  Follow Up Recommendations  SNF;Supervision/Assistance - 24 hour     Equipment Recommendations  None recommended by PT    Recommendations for Other Services       Precautions / Restrictions Precautions Precautions: Fall Precaution Comments: incontinent, takes lots of time    Mobility  Bed Mobility Overal bed mobility: Needs Assistance   Rolling: Max assist         General bed mobility comments: use of bed pad to assist to edge, assist with trunk, multimodal cues for mobility  Transfers Overall transfer level: Needs assistance Equipment used: Rolling walker (2 wheeled) Transfers: Sit to/from [employer] Sit to Stand: Max assist;+2 physical assistance;+2 safety/equipment Stand pivot transfers: Max assist;+2 physical assistance;+2 safety/equipment       General transfer comment: multimodal cues to stand , poor stepping with the R foot, dragging it, assist to advance the leg. pivot to [facility] then to recliner with 2 person max assist.  Ambulation/Gait                 Stairs            Wheelchair Mobility    Modified Rankin (Stroke Patients Only)       Balance           Standing balance support: During functional activity;Bilateral upper extremity  supported Standing balance-Leahy Scale: Poor                      Cognition Arousal/Alertness: Awake/alert Behavior During Therapy: Anxious;Flat affect                        Exercises      General Comments        Pertinent Vitals/Pain Faces Pain Scale: Hurts whole lot Pain Location: all over Pain Descriptors / Indicators: Tender Pain Intervention(s): Limited activity within patient's tolerance;Repositioned;Premedicated before session    Home Living                      Prior Function            PT Goals (current goals can now be found in the care plan section) Progress towards PT goals: Progressing toward goals    Frequency  Min 3X/week    PT Plan Current plan remains appropriate    Co-evaluation             End of Session Equipment Utilized During Treatment: Gait belt Activity Tolerance: Patient limited by fatigue;Patient limited by pain Patient left: in chair;with call bell/phone within reach;with chair alarm set;with family/visitor present     Time: [id number] PT Time Calculation (min) (ACUTE ONLY): 25 min  Charges:  $Therapeutic Activity: 23-37 mins                    G  Codes:      Claretha Cooper 10/18/2015, 2:21 PM Tresa Endo PT [phone]

## 2015-10-18 NOTE — Clinical Social Work Placement (Signed)
   CLINICAL SOCIAL WORK PLACEMENT  NOTE  Date:  10/18/2015  Patient Details  Name: [name] MRN: [id number] Date of Birth: 10-31-1934  Clinical Social Work is seeking post-discharge placement for this patient at the [place] level of care (*CSW will initial, date and re-position this form in  chart as items are completed):  Yes   Patient/family provided with [place] Work Department's list of facilities offering this level of care within the geographic area requested by the patient (or if unable, by the patient's family).  Yes   Patient/family informed of their freedom to choose among providers that offer the needed level of care, that participate in Medicare, Medicaid or managed care program needed by the patient, have an available bed and are willing to accept the patient.  Yes   Patient/family informed of [place]'s ownership interest in [facility] and [facility], as well as of the fact that they are under no obligation to receive care at these facilities.  PASRR submitted to EDS on       PASRR number received on       Existing PASRR number confirmed on 10/18/15     FL2 transmitted to all facilities in geographic area requested by pt/family on 10/18/15     FL2 transmitted to all facilities within larger geographic area on       Patient informed that his/her managed care company has contracts with or will negotiate with certain facilities, including the following:        Yes   Patient/family informed of bed offers received.  Patient chooses bed at [place] [place]     Physician recommends and patient chooses bed at      Patient to be transferred to   on  .  Patient to be transferred to facility by       Patient family notified on   of transfer.  Name of family member notified:        PHYSICIAN       Additional Comment:    _______________________________________________ Luretha Rued, [place] 10/18/2015, 2:35 PM

## 2015-10-18 NOTE — Evaluation (Signed)
Clinical/Bedside Swallow Evaluation Patient Details  Name: [name] MRN: [id number] Date of Birth: [birth year]  Today's Date: 10/18/2015 Time: SLP Start Time (ACUTE ONLY): 0805 SLP Stop Time (ACUTE ONLY): 0855 SLP Time Calculation (min) (ACUTE ONLY): 50 min  Past Medical History:  Past Medical History  Diagnosis Date  . History of sudden visual loss   . Acute cystitis   . Bronchitis, acute   . Hypertension   . Cerebrovascular disease   . Hypothyroidism   . GERD (gastroesophageal reflux disease)   . Diverticulosis of colon   . DJD (degenerative joint disease)   . Back pain   . Transient ischemic attack   . Anxiety   . Depression   . Asthma   . COPD (chronic obstructive pulmonary disease) ([place])   . Shortness of breath     with exertion   . Pneumonia 2015    hx of x 2   . 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 UTI (lower urinary tract infection)     hx of   . Carotid artery occlusion   . Hyperlipidemia   . Aortic stenosis, mild 11/18/2013  . Pulmonary hypertension, moderate to severe ([place]) 11/19/2014  . Anemia   . Fall 07/05/2015   Past Surgical History:  Past Surgical History  Procedure Laterality Date  . Vaginal hysterectomy    . Colectomy    . Tubal ligation    . Replacement total knee      right and left knee  . Cardiothoracic procedure  01/13/2009    Right   . Foot surgery      nerve cut between toes   . Endarterectomy      right carotid endartarectomy - 2010  . Total knee arthroplasty Left 11/17/2012    Procedure: TOTAL KNEE ARTHROPLASTY;  Surgeon: Gearlean Alf, MD;  Location: [facility];  Service: Orthopedics;  Laterality: Left;  . Tee without cardioversion N/A 02/08/2014    Procedure: TRANSESOPHAGEAL ECHOCARDIOGRAM (TEE);  Surgeon: Larey Dresser, MD;  Location: [place];  Service: Cardiovascular;  Laterality: N/A;  . Cardiac catheterization  02/18/14  . Left and right heart catheterization with coronary angiogram N/A 02/18/2014   Procedure: LEFT AND RIGHT HEART CATHETERIZATION WITH CORONARY ANGIOGRAM;  Surgeon: Larey Dresser, MD;  Location: [facility] CATH LAB;  Service: Cardiovascular;  Laterality: N/A;   HPI:  81 yo female adm to [facility] with weakness.  PMH + for parkinson's disease, COPD, cerebrovascuilar, DJD, TIA, anxiety, depression, UTI, CVA, Pt found to have a UTI.  Pt with decreased intake over the last several months with 4 ED admits and 2 hospital admissions.  Swallow evaluation ordered due to concern pt may be aspirating.  Pt denies choking on contrast yesterday.     Assessment / Plan / Recommendation Clinical Impression  Pt presents with worsening of dysphagia since her evaluation January 2017-presumed due to her UTI and deconditioning- .  No indication of airway compromise with minimal po consumed *pancake, sausage, coffee, water.  Prolonged mastication noted with adequate oral clearance.  Pt required encouragement to assist with self feeding = advised to its importance to aid airway protection.  Cough = delayed = x1 after completion of snack observed with pt reporting it was intentional to "clear bubbles".  Pt's motor movements for self feeding with assist are slow due to her Parkinson's disease and she is verbose limiting intake.  Pt quickly stated "I'm full" after minimal po - As imaging  studies are negative at this time and pt appears to be managing her dysphagia, recommend continue diet with strict precautions.   Daughter present and both pt/daughter educated to findings/recommendations.  Will follow for indication for instrumental swallow evaluation and education.  Hopeful for impovement as medical status improves.       Aspiration Risk    Moderate   Diet Recommendation Dysphagia 3 (Mech soft);Thin liquid   Medication Administration: Whole meds with puree (start and follow with liquids) Supervision: Staff to assist with self feeding Compensations: Slow rate;Small sips/bites;Minimize environmental distractions (start  meals with liquids) Postural Changes: Other (Comment)    Other  Recommendations Oral Care Recommendations: Oral care BID   Follow up Recommendations  Other (comment) (TBD)    Frequency and Duration min 2x/week  1 week       Prognosis Prognosis for Safe Diet Advancement: Fair Barriers to Reach Goals: Time post onset;Behavior      Swallow Study   General Date of Onset: 10/18/15 HPI: 81 yo female adm to [facility] with weakness.  PMH + for parkinson's disease, COPD, cerebrovascuilar, DJD, TIA, anxiety, depression, UTI, CVA, Pt found to have a UTI.  Pt with decreased intake over the last several months with 4 ED admits and 2 hospital admissions.  Swallow evaluation ordered due to concern pt may be aspirating.  Pt denies choking on contrast yesterday.   Type of Study: Bedside Swallow Evaluation Diet Prior to this Study: Regular;Thin liquids Temperature Spikes Noted: No Respiratory Status: Room air History of Recent Intubation: No Behavior/Cognition: Alert;Cooperative;Pleasant mood Oral Cavity Assessment: Within Functional Limits Oral Care Completed by SLP: No Oral Cavity - Dentition: Dentures, top;Dentures, bottom Vision: Functional for self-feeding Self-Feeding Abilities: Able to feed self Patient Positioning: Upright in bed Baseline Vocal Quality: Low vocal intensity Volitional Cough: Strong Volitional Swallow: Unable to elicit    Oral/Motor/Sensory Function Overall Oral Motor/Sensory Function: Generalized oral weakness   Ice Chips Ice chips: Not tested   Thin Liquid Thin Liquid: Impaired Presentation: Self Fed;Straw Oral Phase Impairments: Reduced lingual movement/coordination Oral Phase Functional Implications: Prolonged oral transit Pharyngeal  Phase Impairments: Suspected delayed Swallow    Nectar Thick Nectar Thick Liquid: Not tested   Honey Thick Honey Thick Liquid: Not tested   Puree Puree: Not tested   Solid   GO   Solid: Impaired Oral Phase Impairments: Reduced  lingual movement/coordination (slow mastication with multiple respiratory cycles completed with bolus in oral cavity) Oral Phase Functional Implications: Oral holding;Impaired mastication Pharyngeal Phase Impairments: Suspected delayed Elonda Husky, [place] [facility] SLP [phone]

## 2015-10-18 NOTE — NC FL2 (Signed)
[place] LEVEL OF CARE SCREENING TOOL     IDENTIFICATION  Patient Name: [name] Birthdate: Dec 13, 1934 Sex: female Admission Date (Current Location): 10/16/2015  [facility] and [place] Number:  Herbalist and Address:  [facility],  [place] [address], [place]      Provider Number: [id number]  Attending Physician Name and Address:  Nat Math, MD  Relative Name and Phone Number:       Current Level of Care: Hospital Recommended Level of Care: [place] Prior Approval Number:    Date Approved/Denied:   PASRR Number: [id number]  Discharge Plan: SNF    Current Diagnoses: Patient Active Problem List   Diagnosis Date Noted  . UTI (lower urinary tract infection) 10/17/2015  . Dementia in Alzheimer's disease 10/17/2015  . Encephalopathy, metabolic [id number]  . Contusion, hip 07/07/2015  . Falls frequently 07/07/2015  . Cervical radiculopathy 07/07/2015  . Anxiety and depression 07/07/2015  . Gait disturbance 07/07/2015  . Hyperglycemia 07/07/2015  . FTT (failure to thrive) in adult 07/07/2015  . Parkinson disease ([place])   . UTI (urinary tract infection) 07/05/2015  . Chronic neck pain 07/05/2015  . Debility 07/05/2015  . Fatigue 01/19/2015  . Dysuria 01/19/2015  . Chronic restrictive lung disease 01/04/2015  . DOE (dyspnea on exertion) 11/19/2014  . Pulmonary hypertension, moderate to severe ([place]) 11/19/2014  . Irregular heart beats 08/12/2014  . Aftercare following surgery of the circulatory system, [place] 04/19/2014  . Pain of left lower extremity-Knee 04/19/2014  . Weakness 02/11/2014  . Loss of weight 02/11/2014  . Valvular heart disease 01/19/2014  . Pulmonary hypertension ([place]) 01/19/2014  . CAP (community acquired pneumonia) 01/08/2014  . Asthma with acute exacerbation 01/06/2014  . Fall 01/06/2014  . Aortic stenosis, mild 11/18/2013  . Preventative health care 11/18/2013  . Anemia,  unspecified 11/18/2013  . Tremor 09/21/2013  . Dyspnea 05/18/2013  . Physical deconditioning 05/18/2013  . OA (osteoarthritis) of knee 11/17/2012  . Hyperlipidemia 11/04/2012  . Occlusion and stenosis of carotid artery without mention of cerebral infarction 03/06/2012  . ASTHMA 10/09/2010  . Sudden visual loss 01/10/2009  . TRANSIENT ISCHEMIC ATTACK 01/10/2009  . CEREBROVASCULAR DISEASE 11/19/2007  . BRONCHITIS, RECURRENT 11/19/2007  . DIVERTICULOSIS OF COLON 11/19/2007  . Hypothyroidism 08/15/2007  . Anxiety state 08/15/2007  . Depression 08/15/2007  . Essential hypertension 08/15/2007  . GERD 08/15/2007  . Osteoarthritis 08/15/2007  . BACK PAIN, LUMBAR 08/15/2007    Orientation RESPIRATION BLADDER Height & Weight     Self, Place, Situation    Incontinent Weight: 78.472 kg (173 lb) Height:  5\' 5"  (165.1 cm)  BEHAVIORAL SYMPTOMS/MOOD NEUROLOGICAL BOWEL NUTRITION STATUS  Other (Comment) (no behaviors)   Incontinent Diet (DYS 3)  AMBULATORY STATUS COMMUNICATION OF NEEDS Skin   Extensive Assist Verbally Normal                       Personal Care Assistance Level of Assistance  Bathing, Feeding, Dressing Bathing Assistance: Maximum assistance Feeding assistance: Limited assistance Dressing Assistance: Maximum assistance     Functional Limitations Info  Sight, Hearing, Speech Sight Info: Adequate Hearing Info: Adequate Speech Info: Adequate    SPECIAL CARE FACTORS FREQUENCY  PT (By licensed PT), OT (By licensed OT)     PT Frequency: 5 x wk OT Frequency: 5 x wk            Contractures Contractures Info: Not present    Additional Factors Info  Code Status Code Status Info: Full             Current Medications (10/18/2015):  This is the current hospital active medication list Current Facility-Administered Medications  Medication Dose Route Frequency Provider Last Rate Last Dose  . albuterol (PROVENTIL) (2.5 MG/3ML) 0.083% nebulizer solution 2.5 mg   2.5 mg Nebulization Q6H PRN Ilene Qua Opyd, MD      . amLODipine (NORVASC) tablet 5 mg  5 mg Oral Daily Vianne Bulls, MD   5 mg at 10/18/15 0851  . aspirin chewable tablet 81 mg  81 mg Oral Daily Simbiso Ranga, MD   81 mg at 10/18/15 0856  . bisacodyl (DULCOLAX) EC tablet 5 mg  5 mg Oral Daily PRN Vianne Bulls, MD      . carbidopa-levodopa (SINEMET IR) 25-100 MG per tablet immediate release 1.5 tablet  1.5 tablet Oral TID Vianne Bulls, MD   1.5 tablet at 10/18/15 0850  . dextrose 5 % and 0.9 % NaCl with KCl 20 mEq/L infusion   Intravenous Continuous Simbiso Ranga, MD 75 mL/hr at 10/18/15 0717    . diazepam (VALIUM) tablet 5 mg  5 mg Oral Q12H PRN Vianne Bulls, MD   5 mg at 10/17/15 2200  . enoxaparin (LOVENOX) injection 40 mg  40 mg Subcutaneous Q24H Vianne Bulls, MD   40 mg at 10/18/15 0849  . escitalopram (LEXAPRO) tablet 10 mg  10 mg Oral Daily Vianne Bulls, MD   10 mg at 10/18/15 0851  . ferrous sulfate tablet 325 mg  325 mg Oral Q breakfast Vianne Bulls, MD   325 mg at 10/18/15 0850  . hydrALAZINE (APRESOLINE) injection 10 mg  10 mg Intravenous Q4H PRN Vianne Bulls, MD      . levofloxacin (LEVAQUIN) IVPB 750 mg  750 mg Intravenous Q24H Simbiso Ranga, MD   750 mg at 10/17/15 1524  . levothyroxine (SYNTHROID, LEVOTHROID) tablet 50 mcg  50 mcg Oral QAC breakfast Vianne Bulls, MD   50 mcg at 10/18/15 0851  . methocarbamol (ROBAXIN) tablet 500 mg  500 mg Oral BID PRN Vianne Bulls, MD   500 mg at 10/18/15 0240  . metroNIDAZOLE (FLAGYL) IVPB 500 mg  500 mg Intravenous Q8H Simbiso Ranga, MD   500 mg at 10/18/15 0525  . mometasone-formoterol (DULERA) 200-5 MCG/ACT inhaler 2 puff  2 puff Inhalation BID Vianne Bulls, MD   2 puff at 10/18/15 [phone]  . morphine 2 MG/ML injection 1 mg  1 mg Intravenous Q2H PRN Vianne Bulls, MD   1 mg at 10/18/15 0414  . omega-3 acid ethyl esters (LOVAZA) capsule 1 g  1 g Oral Daily Vianne Bulls, MD   1 g at 10/18/15 0850  . ondansetron (ZOFRAN)  tablet 4 mg  4 mg Oral Q6H PRN Vianne Bulls, MD       Or  . ondansetron (ZOFRAN) injection 4 mg  4 mg Intravenous Q6H PRN Ilene Qua Opyd, MD      . ondansetron (ZOFRAN) tablet 4 mg  4 mg Oral Q6H PRN Vianne Bulls, MD      . oxyCODONE-acetaminophen (PERCOCET/ROXICET) 5-325 MG per tablet 1 tablet  1 tablet Oral Q4H PRN Vianne Bulls, MD   1 tablet at 10/18/15 0850  . pantoprazole (PROTONIX) EC tablet 40 mg  40 mg Oral Daily Donald Prose Runyon, RPH      . polyethylene glycol (MIRALAX / GLYCOLAX) packet  17 g  17 g Oral Daily PRN Vianne Bulls, MD   17 g at 10/18/15 0855  . pravastatin (PRAVACHOL) tablet 20 mg  20 mg Oral q1800 Vianne Bulls, MD   20 mg at 10/17/15 1857     Discharge Medications: Please see discharge summary for a list of discharge medications.  Relevant Imaging Results:  Relevant Lab Results:   Additional Information SSN: [id number]  Oluwatamilore Starnes, [place] An, LCSW

## 2015-10-18 NOTE — Care Management Note (Signed)
Case Management Note  Patient Details  Name: [name] MRN: [id number] Date of Birth: [birth year]  Subjective/Objective:                  Gen weakness, lower abd pain, dysuria  Action/Plan: Discharge planning Expected Discharge Date:  10/19/15               Expected Discharge Plan:  [place]  In-House Referral:     Discharge planning Services  CM Consult  Post Acute Care Choice:    Choice offered to:     DME Arranged:    DME Agency:     HH Arranged:    [place] Agency:     Status of Service:  Completed, signed off  Medicare Important Message Given:    Date Medicare IM Given:    Medicare IM give by:    Date Additional Medicare IM Given:    Additional Medicare Important Message give by:     If discussed at [place] of Stay Meetings, dates discussed:    Additional Comments: CM notes pt to go to SNF; CSW arranging.  No other CM needs were communicated. Dellie Catholic, RN 10/18/2015, 2:16 PM

## 2015-10-18 NOTE — Progress Notes (Signed)
PHARMACIST - PHYSICIAN COMMUNICATION  CONCERNING: IV to Oral Route Change Policy  RECOMMENDATION: This patient is receiving pantoprazole by the intravenous route.  Based on criteria approved by the Pharmacy and Therapeutics Committee, the intravenous medication(s) is/are being converted to the equivalent oral dose form(s).   DESCRIPTION: These criteria include:  The patient is eating (either orally or via tube) and/or has been taking other orally administered medications for a least 24 hours  The patient has no evidence of active gastrointestinal bleeding or impaired GI absorption (gastrectomy, short bowel, patient on TNA or NPO).  If you have questions about this conversion, please contact the Pharmacy Department  []   [phone] )  [facility] []   [phone] )  [facility] []   [phone] )  [facility] []   [phone] )  [facility] [x]   [phone] )  [place], PharmD candidate 10/18/2015 8:05 AM

## 2015-10-18 NOTE — Clinical Social Work Note (Signed)
Clinical Social Work Assessment  Patient Details  Name: [name] MRN: [id number] Date of Birth: 1935/07/15  Date of referral:  10/18/15               Reason for consult:  Facility Placement, Discharge Planning                Permission sought to share information with:  Chartered certified accountant granted to share information::  Yes, Verbal Permission Granted  Name::        Agency::     Relationship::     Contact Information:     Housing/Transportation Living arrangements for the past 2 months:  Single Family Home Source of Information:  Adult Children Patient Interpreter Needed:  None Criminal Activity/Legal Involvement Pertinent to Current Situation/Hospitalization:  No - Comment as needed Significant Relationships:  Adult Children Lives with:  Spouse Do you feel safe going back to the place where you live?  No (SNF placement needed.) Need for family participation in patient care:  Yes (Comment)  Care giving concerns:  Pt's care cannot be managed at home following hospital d/c.   Social Worker assessment / plan:  Pt hospitalized on 10/16/15 with an UTI. PT as recommended SNF placement. CSW as met with pt/ daughter [name], to assist wit d/c planning. Pt/daughter are in agreement with SNF placement. SNF search has been initiated and pt/daughter have chosen [facility] ( [facility] ). SNF contacted and is able to accept pt for rehab. CSW will continue to follow to assist with d/c planning to SNF.  Employment status:  Retired Forensic scientist:  Medicare PT Recommendations:  [place] / Referral to community resources:  [place]  Patient/Family's Response to care:  Pt / daughter are with plan for rehab.  Patient/Family's Understanding of and Emotional Response to Diagnosis, Current Treatment, and Prognosis:  Daughter is aware of pt's medical status. Pt has limited awareness due to cognitive status. Daughter is pleased [facility] is  able to assist with rehab. " My mother has been to other rehab placements. I'm really happy that [facility] has an opening. "  Emotional Assessment Appearance:  Appears stated age Attitude/Demeanor/Rapport:  Other (cooperative) Affect (typically observed):  Calm, Appropriate Orientation:  Oriented to Self Alcohol / Substance use:  Not Applicable Psych involvement (Current and /or in the community):  No (Comment)  Discharge Needs  Concerns to be addressed:  Discharge Planning Concerns Readmission within the last 30 days:  No Current discharge risk:  None Barriers to Discharge:  No Barriers Identified   Luretha Rued, [place] 10/18/2015, 2:19 PM

## 2015-10-18 NOTE — Progress Notes (Signed)
Pt A&OX2-3, Very forgetful and with repetitive and multiple requests. Pt Has called out 15 times within 25 minutes per sec Marcie Bal. Pt given Prn meds, per request within parameters. Log of activity and pt call log started to help determine acuity level of patient. IVF's infusing without difficulty. Pt legs elevated on pillows and repositioned multiple times. Ice chips and mouth swabbed multiple times. Emotional comfort and frequent verbal contact given to patient. Call light and phone in reach, bed exit alarm placed. Will continue to monitor frequent call bell activity and requests.

## 2015-10-19 DIAGNOSIS — F329 Major depressive disorder, single episode, unspecified: Secondary | ICD-10-CM

## 2015-10-19 LAB — BASIC METABOLIC PANEL
ANION GAP: 9 (ref 5–15)
BUN: 15 mg/dL (ref 6–20)
CALCIUM: 8.6 mg/dL — AB (ref 8.9–10.3)
CO2: 22 mmol/L (ref 22–32)
CREATININE: 1.12 mg/dL — AB (ref 0.44–1.00)
Chloride: 107 mmol/L (ref 101–111)
GFR, EST AFRICAN AMERICAN: 53 mL/min — AB (ref >60)
GFR, EST NON AFRICAN AMERICAN: 46 mL/min — AB (ref >60)
GLUCOSE: 112 mg/dL — AB (ref 65–99)
Potassium: 4 mmol/L (ref 3.5–5.1)
Sodium: 138 mmol/L (ref 135–145)

## 2015-10-19 LAB — URINE CULTURE

## 2015-10-19 LAB — CBC
HEMATOCRIT: 31.8 % — AB (ref 36.0–46.0)
Hemoglobin: 10.7 g/dL — ABNORMAL LOW (ref 12.0–15.0)
MCH: 33 pg (ref 26.0–34.0)
MCHC: 33.6 g/dL (ref 30.0–36.0)
MCV: 98.1 fL (ref 78.0–100.0)
PLATELETS: 206 10*3/uL (ref 150–400)
RBC: 3.24 MIL/uL — ABNORMAL LOW (ref 3.87–5.11)
RDW: 12.9 % (ref 11.5–15.5)
WBC: 4.8 10*3/uL (ref 4.0–10.5)

## 2015-10-19 LAB — GLUCOSE, CAPILLARY: Glucose-Capillary: 100 mg/dL — ABNORMAL HIGH (ref 65–99)

## 2015-10-19 MED ORDER — ZOLPIDEM TARTRATE 5 MG PO TABS
5.0000 mg | ORAL_TABLET | Freq: Every evening | ORAL | Status: DC | PRN
Start: 2015-10-19 — End: 2015-10-20
  Administered 2015-10-19: 5 mg via ORAL
  Filled 2015-10-19 (×2): qty 1

## 2015-10-19 MED ORDER — LEVOFLOXACIN 250 MG PO TABS
250.0000 mg | ORAL_TABLET | ORAL | Status: DC
  Administered 2015-10-19: 250 mg via ORAL
  Filled 2015-10-19 (×2): qty 1

## 2015-10-19 MED ORDER — ACETAMINOPHEN 325 MG PO TABS: 650.0000 mg | ORAL_TABLET | Freq: Four times a day (QID) | ORAL | Status: DC | PRN

## 2015-10-19 NOTE — Progress Notes (Signed)
CSW assisting with d/c planning. Pt has a SNF bed at [facility] ) today if stable for d/c.   Werner Lean LCSW [phone]

## 2015-10-19 NOTE — Care Management Important Message (Signed)
Important Message  Patient Details  Name: [name] MRN: [id number] Date of Birth: 1935/03/06   Medicare Important Message Given:  Yes    Camillo Flaming 10/19/2015, 9:22 AMImportant Message  Patient Details  Name: [name] MRN: [id number] Date of Birth: [birth year]   Medicare Important Message Given:  Yes    Camillo Flaming 10/19/2015, 9:22 AM

## 2015-10-19 NOTE — Progress Notes (Signed)
PHARMACIST - PHYSICIAN COMMUNICATION CONCERNING: Antibiotic IV to Oral Route Change Policy  RECOMMENDATION: This patient is receiving Levaquin by the intravenous route.  Based on criteria approved by the Pharmacy and Therapeutics Committee, the antibiotic(s) is/are being converted to the equivalent oral dose form(s).  DESCRIPTION: These criteria include:  Patient being treated for a respiratory tract infection, urinary tract infection, cellulitis or clostridium difficile associated diarrhea if on metronidazole  The patient is not neutropenic and does not exhibit a GI malabsorption state  The patient is eating (either orally or via tube) and/or has been taking other orally administered medications for a least 24 hours  The patient is improving clinically and has a Tmax < 100.5  If you have questions about this conversion, please contact the Pharmacy Department  []   [phone] )  [facility] []   [phone] )  [facility] []   [phone] )  [facility] []   [phone] )  [facility] [x]   [phone] )  [facility]   Patient is also receiving IV Flagyl; however, do not know the indication for this so will continue this as IV per MD.  Is there concern for aspiration or IAI?  CXR on admission showed no active disease.  CT abdomen showed only mild diverticulosis.  Consider d/c of Flagyl?

## 2015-10-19 NOTE — Progress Notes (Signed)
Speech Language Pathology Treatment: Dysphagia  Patient Details Name: [name] MRN: [id number] DOB: 10/27/1934 Today's Date: 10/19/2015 Time: 1435-1445 SLP Time Calculation (min) (ACUTE ONLY): 10 min  Assessment / Plan / Recommendation Clinical Impression  Pt today appears with improved strength, ability to move her arms and she was holding her own cup today!  Reviewed precautions with pt to mitigate her dysphagia and aspiration risk.  Pt states she did cough some with her meal but attributes it to secretions.    Pt willing to accept few small sips of water only - swallow was overall timely without indication of airway compromise.  Reviewed with pt clinical reasoning for swallow precautions with pt requiring mod cues to state.    Pt reports she will dc to SNF today, advised her to take her precaution signs with her.  No SLP follow up indicated.    HPI HPI: 81 yo female adm to [facility] with weakness.  PMH + for parkinson's disease, COPD, cerebrovascuilar, DJD, TIA, anxiety, depression, UTI, CVA, Pt found to have a UTI.  Pt with decreased intake over the last several months with 4 ED admits and 2 hospital admissions.  Swallow evaluation ordered due to concern pt may be aspirating.  Bedside swallow eval completed  and follow up indicated to determine tolerance, indication for instrumental evaluation.       SLP Plan  All goals met     Recommendations  Diet recommendations: Dysphagia 3 (mechanical soft);Thin liquid Liquids provided via: Straw;Cup Medication Administration: Whole meds with puree Supervision: Staff to assist with self feeding Compensations: Minimize environmental distractions;Slow rate;Small sips/bites;Follow solids with liquid Postural Changes and/or Swallow Maneuvers: Seated upright 90 degrees;Upright 30-60 min after meal             Follow up Recommendations: None Plan: All goals met     GO                 , MS CCC SLP [phone]    

## 2015-10-19 NOTE — Progress Notes (Signed)
[facility] PROGRESS NOTE  [name] [id number] DOB: [birth year] DOA: 10/16/2015 PCP: Cathlean Cower, MD  Summary [name] is a pleasant 80 y.o. female with Parkinson's disease, Alzheimer's dementia, depression, anxiety, remote stroke, essential hypertension and hypothyroidism who presented to the ED with progressive generalized weakness in the last month but worsening over the last week, lower abdominal pain, dysuria and fall. She was found to have acute metabolic encephalopathy apparently resulting from gram negative UTI. She can barely lift her legs upon admission, and her family stateed that her baseline is being able to walk around with the assistance of a walker and that she can communicate with the family normally although she has seen a gradual decline in terms of Parkinson's disease in the last month. There is question whether she may have aspiration as she has been coughing whenever she drinks water. CXR did not suggest consolidation. She had CT of the brain/CT of the neck at the time of admission and these were unrevealing. CT abdomen and pelvis shows diverticulosis/cholelithiasis without cholecystitis. Patient had fever of 100.72F last night. She was started on Levaquin/Flagyl yesterday evening to cover uti/lungs-?aspiration. Blood cultures, urine culture cooking. She had Foley catheter placed as difficulty mobilizing. Patient seen by speech therapy(appreciate help) with recommendation for Dysphagia 3 diet which has been ordered. She will need SNF placement. She needs psychoactive meds streamlined as they may be contributing to debility. Would continue Levaquin/Flagyl pending septic work up. Patient is more with it today, and believes one of the pain meds is causing reduced arm mobility. I discussed the care plan with her son at the bedside.. Plan Gram negative UTI (urinary tract infection)/Dysuria/Encephalopathy, metabolic/abdominal pain/generalized weakness/abdominal  pain/cholelithiasis  Follow Blood culture/ urine culture  Follow speech therapy recommendations, change diet to dysphagia 3  Day 2 Levaquin/Flagyl Hypothyroidism  On Synthroid Anxiety and depression/ Parkinson disease (HCC)/Dementia in Alzheimer's disease  Continue home medications Essential hypertension  BP fluctuating  Monitor and adjust antihypertensives as needed Anemia, unspecified  ? Related to hypothyroidism  Monitor hemoglobin     Code Status: Full Code Family Communication: Son at bedside Disposition Plan: ?SNF later this week   Consultants:  None  Procedures:    Antibiotics:  Ceftriaxone 10/16/2015 10/17/2015>>  Levaquin 10/17/2015>>  Flagyl 10/17/2015>>  HPI/Subjective: Multiple complaints. Concerned about pain meds.  Objective: Filed Vitals:   10/18/15 2100 10/19/15 0653  BP: 141/49 252/55  Pulse: 77 84  Temp: 98 F (36.7 C) 98.6 F (37 C)  Resp: 17 17    Intake/Output Summary (Last 24 hours) at 10/19/15 0657 Last data filed at 10/19/15 0654  Gross per 24 hour  Intake   1000 ml  Output   3001 ml  Net  -2001 ml   Filed Weights   10/17/15 0038  Weight: 78.472 kg (173 lb)    Exam:   General:  Comfortable at rest.  Cardiovascular: S1-S2 normal. No murmurs. Pulse regular.  Respiratory: Good air entry bilaterally. No rhonchi or rales.  Abdomen: Soft and nontender. Normal bowel sounds. No organomegaly.  Musculoskeletal: No pedal edema   Neurological: Lethargic  Data Reviewed: Basic Metabolic Panel:  Recent Labs Lab 10/13/15 1030 10/16/15 2250 10/18/15 0425 10/19/15 0411  NA 143 141 138 138  K 3.9 4.5 3.8 4.0  CL 107 108 106 107  CO2 25 23 21* 22  GLUCOSE 110* 114* 98 112*  BUN 21* 23* 18 15  CREATININE 1.25* 1.08* 1.07* 1.12*  CALCIUM 9.8 9.4 8.5* 8.6*   Liver Function Tests:  Recent Labs Lab 10/16/15 2250 10/18/15 0425  AST 46* 34  ALT 25 <5*  ALKPHOS 60 52  BILITOT 0.8 0.6  PROT 7.3 6.2*   ALBUMIN 3.9 3.3*    Recent Labs Lab 10/16/15 2250  LIPASE 20   No results for input(s): AMMONIA in the last 168 hours. CBC:  Recent Labs Lab 10/13/15 1030 10/16/15 2210 10/18/15 0425 10/19/15 0411  WBC 5.5 8.3 4.7 4.8  NEUTROABS  --  6.8 3.0  --   HGB 10.9* 11.8* 10.2* 10.7*  HCT 32.8* 35.7* 29.6* 31.8*  MCV 97.6 99.4 94.9 98.1  PLT 258 267 213 206   Cardiac Enzymes:  Recent Labs Lab 10/13/15 1349 10/16/15 2210  TROPONINI <0.03 <0.03   BNP (last 3 results) No results for input(s): BNP in the last 8760 hours.  ProBNP (last 3 results)  Recent Labs  12/06/14 1421  PROBNP 61.0    CBG:  Recent Labs Lab 10/13/15 1025 10/17/15 0723 10/18/15 0719  GLUCAP 99 87 85    Recent Results (from the past 240 hour(s))  Urine culture     Status: None (Preliminary result)   Collection Time: 10/16/15 10:01 PM  Result Value Ref Range Status   Specimen Description URINE, CLEAN CATCH  Final   Special Requests NONE  Final   Culture   Final    >=100,000 COLONIES/mL GRAM NEGATIVE RODS Performed at [facility]    Report Status PENDING  Incomplete  Culture, blood (routine x 2)     Status: None (Preliminary result)   Collection Time: 10/17/15 12:42 PM  Result Value Ref Range Status   Specimen Description BLOOD LEFT HAND  Final   Special Requests IN PEDIATRIC BOTTLE 2CC  Final   Culture   Final    NO GROWTH 1 DAY Performed at [facility]    Report Status PENDING  Incomplete  Culture, blood (routine x 2)     Status: None (Preliminary result)   Collection Time: 10/17/15 12:44 PM  Result Value Ref Range Status   Specimen Description BLOOD RIGHT HAND  Final   Special Requests BOTTLES DRAWN AEROBIC ONLY 5CC  Final   Culture   Final    NO GROWTH 1 DAY Performed at [facility]    Report Status PENDING  Incomplete     Studies: Ct Abdomen Pelvis W Contrast  10/17/2015  CLINICAL DATA:  81 year old female presenting with generalized weakness  over the past month, worsening in the past week, with lower abdominal pain, history a and recent history of fall. Urinary tract infection. EXAM: CT ABDOMEN AND PELVIS WITH CONTRAST TECHNIQUE: Multidetector CT imaging of the abdomen and pelvis was performed using the standard protocol following bolus administration of intravenous contrast. CONTRAST:  1 OMNIPAQUE IOHEXOL 300 MG/ML SOLN, 63mL OMNIPAQUE IOHEXOL 300 MG/ML SOLN COMPARISON:  No priors. FINDINGS: Lower chest: Mild scarring in the lower lungs bilaterally. Small hiatal hernia. Hepatobiliary: Mild diffuse low attenuation throughout the hepatic parenchyma, compatible with mild hepatic steatosis. No definite cystic or solid hepatic lesions. No intra or extrahepatic biliary ductal dilatation. Gallbladder is moderately dilated. Tiny calcified gallstones lying dependently in the gallbladder. No findings to suggest an acute cholecystitis at this time. Pancreas: No pancreatic mass. No pancreatic ductal dilatation. No pancreatic or peripancreatic fluid or inflammatory changes. Spleen: Unremarkable. Adrenals/Urinary Tract: The appearance of the kidneys and bilateral adrenal glands is normal. No hydroureteronephrosis. Foley balloon catheter is present in the lumen of the urinary bladder. Small amount of gas in  the urinary bladder, presumably iatrogenic. Urinary bladder is otherwise unremarkable in appearance. Stomach/Bowel: The appearance of the stomach is normal. No pathologic dilatation of small bowel or colon. Two duodenal diverticulae from the second portion of the duodenum incidentally noted. No surrounding inflammatory changes are noted at this time. Several colonic diverticulae are also noted in the descending colon without surrounding inflammatory changes. Vascular/Lymphatic: Atherosclerosis throughout the abdominal and pelvic vasculature, without evidence of aneurysm or dissection. No lymphadenopathy noted in the abdomen or pelvis. Reproductive: Status post  hysterectomy. Ovaries are not confidently identified may be surgically absent or atrophic. Other: No significant volume of ascites.  No pneumoperitoneum. Musculoskeletal: There are no aggressive appearing lytic or blastic lesions noted in the visualized portions of the skeleton. 7 mm of anterolisthesis of L4 upon L5. IMPRESSION: 1. No acute findings in the abdomen or pelvis to account for the patient's symptoms. 2. Mild colonic diverticulosis without evidence of acute diverticulitis at this time. 3. Cholelithiasis without evidence of acute cholecystitis at this time. 4. Mild hepatic steatosis. 5. Additional incidental findings, as above. Electronically Signed   By: Vinnie Langton M.D.   On: 10/17/2015 19:14    Scheduled Meds: . amLODipine  5 mg Oral Daily  . aspirin  81 mg Oral Daily  . carbidopa-levodopa  1.5 tablet Oral TID  . enoxaparin (LOVENOX) injection  40 mg Subcutaneous Q24H  . escitalopram  10 mg Oral Daily  . ferrous sulfate  325 mg Oral Q breakfast  . levofloxacin (LEVAQUIN) IV  250 mg Intravenous Q24H  . levothyroxine  50 mcg Oral QAC breakfast  . metronidazole  500 mg Intravenous Q8H  . mometasone-formoterol  2 puff Inhalation BID  . omega-3 acid ethyl esters  1 g Oral Daily  . pantoprazole  40 mg Oral Daily  . pravastatin  20 mg Oral q1800   Continuous Infusions: . dextrose 5 % and 0.9 % NaCl with KCl 20 mEq/L 75 mL/hr at 10/18/15 2058     Time spent: 25 minutes    Jaidee Stipe  [facility] Pager [phone]. If 7PM-7AM, please contact night-coverage at [email or web address], password [email or web address] 10/19/2015, 6:57 AM  LOS: 3 days

## 2015-10-19 NOTE — Progress Notes (Signed)
[facility]                                                                              Patient Demographics  [name], is a 81 y.o. female, DOB - July 23, 1935, [id number]  Admit date - 10/16/2015   Admitting Physician Vianne Bulls, MD  Outpatient Primary MD for the patient is Cathlean Cower, MD  LOS - 3   Chief Complaint  Patient presents with  . Weakness      HPI on 10/17/2015 by Dr. Mitzi Hansen [name] is a 81 y.o. female with PMH of Parkinson's disease, depression, anxiety, remote stroke, and hypothyroidism who presents to the ED with 2 days of progressive generalized weakness, lower abdominal pain, and dysuria. Patient is accompanied by her daughter who provides much of the history. She had apparently been in her usual state of health, ambulatory without assistance, and until approximately 2 days ago when she was noted to be increasingly weak. She also began to complain of lower abdominal pain and dysuria at that time and her symptoms have progressed over the past 2 days to the point where she required 2 person assist to make it to the bathroom today. Family reports similar symptoms in the past when she's had a urinary tract infection and brought her in for evaluation. Patient's dementia precludes her from giving a meaningful description of her symptoms, but her family has not noticed any vomiting or diarrhea. Her appetite has dropped off over the last 2 days.  In ED, patient was found to be afebrile, saturating well on room air, and with vital signs stable. Chest x-ray was negative for acute cardiopulmonary disease. Urine was analyzed and features many bacteria, large leukocyte, positive nitrite, and too numerous to count white blood cells. CMP is largely unremarkable and CBC features a stable chronic anemia with hemoglobin of 11.8. Troponin is undetectable and EKG features a sinus tachycardia with right axis deviation. Urine was sent for culture and the patient was  treated with an empiric dose of Rocephin. CT of the head and C-spine were obtained given the patient's generalized weakness and reports of a fall a couple weeks ago. Imaging was negative for any acute injury. Patient remained stable in the emergency department and will be admitted for ongoing evaluation and management of generalized weakness and lower abdominal pain secondary to urinary tract infection.  Interim history Patient transition to Levaquin and Flagyl on 10/17/2015 to cover for possible UTI/questionable aspiration. Blood cultures negative today, urine culture shows gram-negative rods. Patient will likely need to be discharged to nursing facility.  Assessment & Plan   Dysuria/UTI/suprapubic pain and weakness -Patient started on Rocephin, then transition to Flagyl and Levaquin -Will discontinue Flagyl today -Blood cultures show no growth to date -Urine culture shows >100K GNR  Hypothyroidism -Continue Synthroid  Anxiety/depression -Continue Lexapro and Valium as needed  Parkinson's disease/Dementia -Continue Sinemet  Essential hypertension -Continue Norvasc, IV hydralazine as needed  Chronic normocytic Anemia -Baseline hemoglobin approximately 10-11, currently 10.7 -Continue iron supplementation  Generalized weakness -Likely secondary to the above -PT recommended SNF  Code Status: Full  Family Communication: Son at bedside  Disposition Plan: Admitted. Pending Urine culture. Expect discharge  within 24-48 hours.  Time Spent in minutes   30 minutes  Procedures  None  Consults   None  DVT Prophylaxis  Lovenox  Lab Results  Component Value Date   PLT 206 10/19/2015    Medications  Scheduled Meds: . amLODipine  5 mg Oral Daily  . aspirin  81 mg Oral Daily  . carbidopa-levodopa  1.5 tablet Oral TID  . enoxaparin (LOVENOX) injection  40 mg Subcutaneous Q24H  . escitalopram  10 mg Oral Daily  . ferrous sulfate  325 mg Oral Q breakfast  . levofloxacin  250  mg Oral Q24H  . levothyroxine  50 mcg Oral QAC breakfast  . metronidazole  500 mg Intravenous Q8H  . mometasone-formoterol  2 puff Inhalation BID  . omega-3 acid ethyl esters  1 g Oral Daily  . pantoprazole  40 mg Oral Daily  . pravastatin  20 mg Oral q1800   Continuous Infusions:  PRN Meds:.acetaminophen, albuterol, bisacodyl, diazepam, hydrALAZINE, HYDROcodone-acetaminophen, methocarbamol, morphine injection, ondansetron **OR** ondansetron (ZOFRAN) IV, ondansetron, polyethylene glycol, zolpidem  Antibiotics    Anti-infectives    Start     Dose/Rate Route Frequency Ordered Stop   10/19/15 1400  levofloxacin (LEVAQUIN) tablet 250 mg     250 mg Oral Every 24 hours 10/19/15 1114     10/18/15 1300  Levofloxacin (LEVAQUIN) IVPB 250 mg  Status:  Discontinued     250 mg 50 mL/hr over 60 Minutes Intravenous Every 24 hours 10/18/15 1003 10/19/15 1114   10/17/15 2200  cefTRIAXone (ROCEPHIN) 1 g in dextrose 5 % 50 mL IVPB  Status:  Discontinued     1 g 100 mL/hr over 30 Minutes Intravenous Every 24 hours 10/17/15 0144 10/17/15 1222   10/17/15 1300  levofloxacin (LEVAQUIN) IVPB 750 mg  Status:  Discontinued     750 mg 100 mL/hr over 90 Minutes Intravenous Every 24 hours 10/17/15 1222 10/18/15 1003   10/17/15 1300  metroNIDAZOLE (FLAGYL) IVPB 500 mg     500 mg 100 mL/hr over 60 Minutes Intravenous Every 8 hours 10/17/15 1227     10/16/15 2300  cefTRIAXone (ROCEPHIN) 1 g in dextrose 5 % 50 mL IVPB     1 g 100 mL/hr over 30 Minutes Intravenous  Once 10/16/15 2254 10/16/15 2349      Subjective:   [name] seen and examined today.  Patient feels her abdominal pain is improved slightly. Denies chest pain or shortness of breath. Does admit to decreased appetite but states that she's been drinking more and eating. No complaints of nausea or vomiting.  Objective:   Filed Vitals:   10/18/15 2343 10/19/15 0610 10/19/15 0653 10/19/15 0935  BP:    118/47  Pulse:   84   Temp:   98.6 F (37  C)   TempSrc:   Oral   Resp:   17   Height:      Weight:      SpO2: 96% 92% 94%     Wt Readings from Last 3 Encounters:  10/17/15 78.472 kg (173 lb)  10/13/15 78.472 kg (173 lb)  09/19/15 79.833 kg (176 lb)     Intake/Output Summary (Last 24 hours) at 10/19/15 1237 Last data filed at 10/19/15 1049  Gross per 24 hour  Intake   1120 ml  Output   3152 ml  Net  -2032 ml    Exam  General: Well developed, well nourished, NAD  HEENT: NCAT,  mucous membranes moist.   Cardiovascular: S1 S2  auscultated, no murmurs, RRR.  Respiratory: Clear to auscultation   Abdomen: Soft, nontender, nondistended, + bowel sounds  Extremities: warm dry without cyanosis clubbing or edema  Neuro: AAOx2, nonfocal  Psych: Normal affect and demeanor   Data Review   Micro Results Recent Results (from the past 240 hour(s))  Urine culture     Status: None   Collection Time: 10/16/15 10:01 PM  Result Value Ref Range Status   Specimen Description URINE, CLEAN CATCH  Final   Special Requests NONE  Final   Culture   Final    >=100,000 COLONIES/mL KLEBSIELLA PNEUMONIAE Performed at [facility]    Report Status 10/19/2015 FINAL  Final   Organism ID, Bacteria KLEBSIELLA PNEUMONIAE  Final      Susceptibility   Klebsiella pneumoniae - MIC*    AMPICILLIN 16 RESISTANT Resistant     CEFAZOLIN <=4 SENSITIVE Sensitive     CEFTRIAXONE <=1 SENSITIVE Sensitive     CIPROFLOXACIN <=0.25 SENSITIVE Sensitive     GENTAMICIN <=1 SENSITIVE Sensitive     IMIPENEM <=0.25 SENSITIVE Sensitive     NITROFURANTOIN <=16 SENSITIVE Sensitive     TRIMETH/SULFA <=20 SENSITIVE Sensitive     AMPICILLIN/SULBACTAM <=2 SENSITIVE Sensitive     PIP/TAZO <=4 SENSITIVE Sensitive     * >=100,000 COLONIES/mL KLEBSIELLA PNEUMONIAE  Culture, blood (routine x 2)     Status: None (Preliminary result)   Collection Time: 10/17/15 12:42 PM  Result Value Ref Range Status   Specimen Description BLOOD LEFT HAND  Final   Special  Requests IN PEDIATRIC BOTTLE 2CC  Final   Culture   Final    NO GROWTH 1 DAY Performed at [facility]    Report Status PENDING  Incomplete  Culture, blood (routine x 2)     Status: None (Preliminary result)   Collection Time: 10/17/15 12:44 PM  Result Value Ref Range Status   Specimen Description BLOOD RIGHT HAND  Final   Special Requests BOTTLES DRAWN AEROBIC ONLY 5CC  Final   Culture   Final    NO GROWTH 1 DAY Performed at [facility]    Report Status PENDING  Incomplete    Radiology Reports Dg Chest 2 View  10/13/2015  CLINICAL DATA:  RIGHT-sided chest pain, leg spasms and ankle swelling, increased falls, hypertension, valvular heart disease, Parkinson's, asthma, COPD, pulmonary hypertension EXAM: CHEST  2 VIEW COMPARISON:  11/19/2014 FINDINGS: Normal heart size, mediastinal contours, and pulmonary vascularity. Atherosclerotic calcification aorta. Emphysematous and bronchitic changes consistent with COPD. No acute infiltrate, pleural effusion or pneumothorax. Bones demineralized. IMPRESSION: COPD changes. No acute abnormalities. Electronically Signed   By: Lavonia Dana M.D.   On: 10/13/2015 11:19   Ct Head Wo Contrast  10/16/2015  CLINICAL DATA:  Weakness, onset tonight. EXAM: CT HEAD WITHOUT CONTRAST CT CERVICAL SPINE WITHOUT CONTRAST TECHNIQUE: Multidetector CT imaging of the head and cervical spine was performed following the standard protocol without intravenous contrast. Multiplanar CT image reconstructions of the cervical spine were also generated. COMPARISON:  07/07/2015 FINDINGS: CT HEAD FINDINGS There is no intracranial hemorrhage, mass or evidence of acute infarction. There is moderate generalized atrophy. There is moderate chronic microvascular ischemic change. There is no significant extra-axial fluid collection. No acute intracranial findings are evident. No interval change is evident from 07/07/2015. No bony abnormalities are evident. Visible paranasal sinuses  are clear. CT CERVICAL SPINE FINDINGS The vertebral column, pedicles and facet articulations are intact. There is no evidence of acute fracture. No acute  soft tissue abnormalities are evident. Moderately severe degenerative cervical disc disease is present from C3 through C7 with prominent osteophytes. Probable degenerative central canal stenosis at C4-5 and C5-6. IMPRESSION: 1. Negative for acute intracranial traumatic injury. There is moderate generalized atrophy and chronic microvascular white matter changes. 2. Negative for acute cervical spine fracture. Degenerative central canal stenosis in the midcervical spine. Electronically Signed   By: Andreas Newport M.D.   On: 10/16/2015 22:52   Ct Cervical Spine Wo Contrast  10/16/2015  CLINICAL DATA:  Weakness, onset tonight. EXAM: CT HEAD WITHOUT CONTRAST CT CERVICAL SPINE WITHOUT CONTRAST TECHNIQUE: Multidetector CT imaging of the head and cervical spine was performed following the standard protocol without intravenous contrast. Multiplanar CT image reconstructions of the cervical spine were also generated. COMPARISON:  07/07/2015 FINDINGS: CT HEAD FINDINGS There is no intracranial hemorrhage, mass or evidence of acute infarction. There is moderate generalized atrophy. There is moderate chronic microvascular ischemic change. There is no significant extra-axial fluid collection. No acute intracranial findings are evident. No interval change is evident from 07/07/2015. No bony abnormalities are evident. Visible paranasal sinuses are clear. CT CERVICAL SPINE FINDINGS The vertebral column, pedicles and facet articulations are intact. There is no evidence of acute fracture. No acute soft tissue abnormalities are evident. Moderately severe degenerative cervical disc disease is present from C3 through C7 with prominent osteophytes. Probable degenerative central canal stenosis at C4-5 and C5-6. IMPRESSION: 1. Negative for acute intracranial traumatic injury. There is  moderate generalized atrophy and chronic microvascular white matter changes. 2. Negative for acute cervical spine fracture. Degenerative central canal stenosis in the midcervical spine. Electronically Signed   By: Andreas Newport M.D.   On: 10/16/2015 22:52   Ct Abdomen Pelvis W Contrast  10/17/2015  CLINICAL DATA:  81 year old female presenting with generalized weakness over the past month, worsening in the past week, with lower abdominal pain, history a and recent history of fall. Urinary tract infection. EXAM: CT ABDOMEN AND PELVIS WITH CONTRAST TECHNIQUE: Multidetector CT imaging of the abdomen and pelvis was performed using the standard protocol following bolus administration of intravenous contrast. CONTRAST:  1 OMNIPAQUE IOHEXOL 300 MG/ML SOLN, 35mL OMNIPAQUE IOHEXOL 300 MG/ML SOLN COMPARISON:  No priors. FINDINGS: Lower chest: Mild scarring in the lower lungs bilaterally. Small hiatal hernia. Hepatobiliary: Mild diffuse low attenuation throughout the hepatic parenchyma, compatible with mild hepatic steatosis. No definite cystic or solid hepatic lesions. No intra or extrahepatic biliary ductal dilatation. Gallbladder is moderately dilated. Tiny calcified gallstones lying dependently in the gallbladder. No findings to suggest an acute cholecystitis at this time. Pancreas: No pancreatic mass. No pancreatic ductal dilatation. No pancreatic or peripancreatic fluid or inflammatory changes. Spleen: Unremarkable. Adrenals/Urinary Tract: The appearance of the kidneys and bilateral adrenal glands is normal. No hydroureteronephrosis. Foley balloon catheter is present in the lumen of the urinary bladder. Small amount of gas in the urinary bladder, presumably iatrogenic. Urinary bladder is otherwise unremarkable in appearance. Stomach/Bowel: The appearance of the stomach is normal. No pathologic dilatation of small bowel or colon. Two duodenal diverticulae from the second portion of the duodenum incidentally noted.  No surrounding inflammatory changes are noted at this time. Several colonic diverticulae are also noted in the descending colon without surrounding inflammatory changes. Vascular/Lymphatic: Atherosclerosis throughout the abdominal and pelvic vasculature, without evidence of aneurysm or dissection. No lymphadenopathy noted in the abdomen or pelvis. Reproductive: Status post hysterectomy. Ovaries are not confidently identified may be surgically absent or atrophic. Other: No significant volume of  ascites.  No pneumoperitoneum. Musculoskeletal: There are no aggressive appearing lytic or blastic lesions noted in the visualized portions of the skeleton. 7 mm of anterolisthesis of L4 upon L5. IMPRESSION: 1. No acute findings in the abdomen or pelvis to account for the patient's symptoms. 2. Mild colonic diverticulosis without evidence of acute diverticulitis at this time. 3. Cholelithiasis without evidence of acute cholecystitis at this time. 4. Mild hepatic steatosis. 5. Additional incidental findings, as above. Electronically Signed   By: Vinnie Langton M.D.   On: 10/17/2015 19:14   Dg Chest Port 1 View  10/16/2015  CLINICAL DATA:  Initial evaluation for generalized weakness. EXAM: PORTABLE CHEST 1 VIEW COMPARISON:  Prior radiograph from 10/13/2015. FINDINGS: The cardiac and mediastinal silhouettes are stable in size and contour, and remain within normal limits. Mild atheromatous plaque at the aortic arch. Mild prominence of the right main PA, stable. The lungs are normally inflated. Changes related COPD noted. No airspace consolidation, pleural effusion, or pulmonary edema is identified. Curvilinear lucency overlying the right lung consistent with a skin fold. No pneumothorax. No acute osseous abnormality identified. IMPRESSION: No active disease. Electronically Signed   By: Jeannine Boga M.D.   On: 10/16/2015 22:41    CBC  Recent Labs Lab 10/13/15 1030 10/16/15 2210 10/18/15 0425 10/19/15 0411    WBC 5.5 8.3 4.7 4.8  HGB 10.9* 11.8* 10.2* 10.7*  HCT 32.8* 35.7* 29.6* 31.8*  PLT 258 267 213 206  MCV 97.6 99.4 94.9 98.1  MCH 32.4 32.9 32.7 33.0  MCHC 33.2 33.1 34.5 33.6  RDW 12.3 13.7 12.8 12.9  LYMPHSABS  --  0.6* 0.8  --   MONOABS  --  0.9 0.8  --   EOSABS  --  0.0 0.0  --   BASOSABS  --  0.0 0.0  --     Chemistries   Recent Labs Lab 10/13/15 1030 10/16/15 2250 10/18/15 0425 10/19/15 0411  NA 143 141 138 138  K 3.9 4.5 3.8 4.0  CL 107 108 106 107  CO2 25 23 21* 22  GLUCOSE 110* 114* 98 112*  BUN 21* 23* 18 15  CREATININE 1.25* 1.08* 1.07* 1.12*  CALCIUM 9.8 9.4 8.5* 8.6*  AST  --  46* 34  --   ALT  --  25 <5*  --   ALKPHOS  --  60 52  --   BILITOT  --  0.8 0.6  --    ------------------------------------------------------------------------------------------------------------------ estimated creatinine clearance is 42.9 mL/min (by C-G formula based on Cr of 1.12). ------------------------------------------------------------------------------------------------------------------ No results for input(s): HGBA1C in the last 72 hours. ------------------------------------------------------------------------------------------------------------------ No results for input(s): CHOL, HDL, LDLCALC, TRIG, CHOLHDL, LDLDIRECT in the last 72 hours. ------------------------------------------------------------------------------------------------------------------ No results for input(s): TSH, T4TOTAL, T3FREE, THYROIDAB in the last 72 hours.  Invalid input(s): FREET3 ------------------------------------------------------------------------------------------------------------------ No results for input(s): VITAMINB12, FOLATE, FERRITIN, TIBC, IRON, RETICCTPCT in the last 72 hours.  Coagulation profile No results for input(s): INR, PROTIME in the last 168 hours.  No results for input(s): DDIMER in the last 72 hours.  Cardiac Enzymes  Recent Labs Lab 10/13/15 1349  10/16/15 2210  TROPONINI <0.03 <0.03   ------------------------------------------------------------------------------------------------------------------ Invalid input(s): POCBNP    Kearstyn Avitia D.O. on 10/19/2015 at 12:37 PM  Between 7am to 7pm - Pager - [phone]  After 7pm go to [email or web address] - password [email or web address]  And look for the night coverage person covering for me after hours  [facility] Office  [phone]

## 2015-10-20 ENCOUNTER — Telehealth: Payer: Self-pay | Admitting: *Deleted

## 2015-10-20 DIAGNOSIS — G20 Parkinson's disease: Secondary | ICD-10-CM

## 2015-10-20 DIAGNOSIS — M542 Cervicalgia: Secondary | ICD-10-CM | POA: Diagnosis not present

## 2015-10-20 DIAGNOSIS — G9341 Metabolic encephalopathy: Secondary | ICD-10-CM | POA: Diagnosis not present

## 2015-10-20 DIAGNOSIS — R5381 Other malaise: Secondary | ICD-10-CM | POA: Diagnosis not present

## 2015-10-20 DIAGNOSIS — I10 Essential (primary) hypertension: Secondary | ICD-10-CM | POA: Diagnosis not present

## 2015-10-20 DIAGNOSIS — M6281 Muscle weakness (generalized): Secondary | ICD-10-CM | POA: Diagnosis not present

## 2015-10-20 DIAGNOSIS — R2689 Other abnormalities of gait and mobility: Secondary | ICD-10-CM | POA: Diagnosis not present

## 2015-10-20 DIAGNOSIS — D649 Anemia, unspecified: Secondary | ICD-10-CM | POA: Diagnosis not present

## 2015-10-20 DIAGNOSIS — E039 Hypothyroidism, unspecified: Secondary | ICD-10-CM | POA: Diagnosis not present

## 2015-10-20 DIAGNOSIS — R262 Difficulty in walking, not elsewhere classified: Secondary | ICD-10-CM | POA: Diagnosis not present

## 2015-10-20 DIAGNOSIS — G309 Alzheimer's disease, unspecified: Secondary | ICD-10-CM

## 2015-10-20 DIAGNOSIS — N390 Urinary tract infection, site not specified: Principal | ICD-10-CM

## 2015-10-20 DIAGNOSIS — R300 Dysuria: Secondary | ICD-10-CM | POA: Diagnosis not present

## 2015-10-20 DIAGNOSIS — F0280 Dementia in other diseases classified elsewhere without behavioral disturbance: Secondary | ICD-10-CM

## 2015-10-20 DIAGNOSIS — R41841 Cognitive communication deficit: Secondary | ICD-10-CM | POA: Diagnosis not present

## 2015-10-20 DIAGNOSIS — D6489 Other specified anemias: Secondary | ICD-10-CM | POA: Diagnosis not present

## 2015-10-20 DIAGNOSIS — R1311 Dysphagia, oral phase: Secondary | ICD-10-CM | POA: Diagnosis not present

## 2015-10-20 DIAGNOSIS — R531 Weakness: Secondary | ICD-10-CM | POA: Diagnosis not present

## 2015-10-20 LAB — BASIC METABOLIC PANEL
Anion gap: 9 (ref 5–15)
BUN: 15 mg/dL (ref 6–20)
CHLORIDE: 107 mmol/L (ref 101–111)
CO2: 23 mmol/L (ref 22–32)
CREATININE: 1.02 mg/dL — AB (ref 0.44–1.00)
Calcium: 8.7 mg/dL — ABNORMAL LOW (ref 8.9–10.3)
GFR calc non Af Amer: 51 mL/min — ABNORMAL LOW (ref >60)
GFR, EST AFRICAN AMERICAN: 59 mL/min — AB (ref >60)
Glucose, Bld: 87 mg/dL (ref 65–99)
Potassium: 3.8 mmol/L (ref 3.5–5.1)
Sodium: 139 mmol/L (ref 135–145)

## 2015-10-20 LAB — CBC
HEMATOCRIT: 30.9 % — AB (ref 36.0–46.0)
HEMOGLOBIN: 10.6 g/dL — AB (ref 12.0–15.0)
MCH: 32.4 pg (ref 26.0–34.0)
MCHC: 34.3 g/dL (ref 30.0–36.0)
MCV: 94.5 fL (ref 78.0–100.0)
Platelets: 202 10*3/uL (ref 150–400)
RBC: 3.27 MIL/uL — ABNORMAL LOW (ref 3.87–5.11)
RDW: 12.7 % (ref 11.5–15.5)
WBC: 5.2 10*3/uL (ref 4.0–10.5)

## 2015-10-20 LAB — GLUCOSE, CAPILLARY: Glucose-Capillary: 91 mg/dL (ref 65–99)

## 2015-10-20 MED ORDER — LEVOFLOXACIN 250 MG PO TABS: 250.0000 mg | ORAL_TABLET | ORAL | Status: DC

## 2015-10-20 MED ORDER — POLYETHYLENE GLYCOL 3350 17 G PO PACK: 17.0000 g | PACK | Freq: Every day | ORAL | Status: DC | PRN

## 2015-10-20 MED ORDER — DIAZEPAM 2 MG PO TABS: ORAL_TABLET | ORAL | Status: DC

## 2015-10-20 MED ORDER — BISACODYL 5 MG PO TBEC
5.0000 mg | DELAYED_RELEASE_TABLET | Freq: Every day | ORAL | Status: DC | PRN
Start: 2015-10-20 — End: 2016-11-06

## 2015-10-20 NOTE — Discharge Summary (Signed)
Discharge Summary  [name] [id number] DOB: [birth year]  PCP: Cathlean Cower, MD  Admit date: 10/16/2015 Discharge date: 10/20/2015  Time spent: 25 minutes   Recommendations for Outpatient Follow-up:  1. Diet change: Dysphagia 3 mechanical soft with thin liquids-see below for further dietary instructions 2. New Medication: Levaquin 250 mg by mouth daily 3 days 3. Patient being discharged to [facility] 4.  Medication change: Valium changed to 1-2 mg by mouth twice a day when necessary  Discharge Diagnoses:  Active Hospital Problems   Diagnosis Date Noted  . UTI (urinary tract infection) 07/05/2015  . UTI (lower urinary tract infection) 10/17/2015  . Dementia in Alzheimer's disease 10/17/2015  . Encephalopathy, metabolic [id number]  . Anxiety and depression 07/07/2015  . Parkinson disease ([place])   . Dysuria 01/19/2015  . Anemia, unspecified 11/18/2013  . Hypothyroidism 08/15/2007  . Depression 08/15/2007  . Essential hypertension 08/15/2007    Resolved Hospital Problems   Diagnosis Date Noted Date Resolved  No resolved problems to display.    Discharge Condition: Improved, being discharged home   Diet recommendation: Dysphagia 3 with thin liquids, full meds with pure, staff to assist. Please sit upright 90, including 30-60 minutes after meal  Filed Vitals:   10/19/15 2049 10/20/15 0446  BP: 116/46 122/51  Pulse: 83 73  Temp: 98.7 F (37.1 C) 98.3 F (36.8 C)  Resp: 16 16    History of present illness:  81 year old female with past oral history of Parkinson's disease, mild dementia, depression and hypothyroidism admitted on 3/20 after coming to emergency room with 2 days of progressive weakness, abdominal pain and dysuria . In emergency room, patient found to have large UTI and mild sinus tachycardia.    Hospital Course:  Principal Problem:   UTI (urinary tract infection) causing dysuria: Initially treated with broad-spectrum antibiotics,  urine cultures back for Klebsiella. Blood cultures remained normal. Treated with Flagyl and Levaquin until cultures came back and now with Levaquin, renally dosed. Active Problems:   Hypothyroidism: Continue on Synthroid   Depression: Continued on Lexapro   Essential hypertension   Anemia, unspecified    Parkinson disease ([place]): Continue Sinemet    Metabolic encephalopathy in the setting of Dementia in Alzheimer's disease: Improved with treatment of infection. Seen by PT and OT who recommended skilled nursing. After discussion with family, patient for skilled nursing on 3/23 Dysphagia: Seen by speech therapy who downgraded patient to dysphagia 3 diet with thin liquids   Procedures:  None   Consultations:  None   Discharge Exam: BP 122/51 mmHg  Pulse 73  Temp(Src) 98.3 F (36.8 C) (Oral)  Resp 16  Ht 5\' 5"  (1.651 m)  Wt 78.472 kg (173 lb)  BMI 28.79 kg/m2  SpO2 94%  General: Alert and oriented 2, no acute distress  Cardiovascular: Regular rate and rhythm, S1-S2  Respiratory: Clear to auscultation bilaterally   Discharge Instructions You were cared for by a hospitalist during your hospital stay. If you have any questions about your discharge medications or the care you received while you were in the hospital after you are discharged, you can call the unit and asked to speak with the hospitalist on call if the hospitalist that took care of you is not available. Once you are discharged, your primary care physician will handle any further medical issues. Please note that NO REFILLS for any discharge medications will be authorized once you are discharged, as it is imperative that you return to your primary care physician (or  establish a relationship with a primary care physician if you do not have one) for your aftercare needs so that they can reassess your need for medications and monitor your lab values.  Discharge Instructions    Diet - low sodium heart healthy    Complete by:   As directed      Increase activity slowly    Complete by:  As directed             Medication List    STOP taking these medications        methocarbamol 500 MG tablet  Commonly known as:  ROBAXIN     ondansetron 4 MG tablet  Commonly known as:  ZOFRAN     oxyCODONE-acetaminophen 5-325 MG tablet  Commonly known as:  PERCOCET      TAKE these medications        ADVAIR HFA 230-21 MCG/ACT inhaler  Generic drug:  fluticasone-salmeterol  Inhale 2 puffs into the lungs 2 (two) times daily.     albuterol 108 (90 Base) MCG/ACT inhaler  Commonly known as:  PROVENTIL HFA;VENTOLIN HFA  Inhale 2 puffs into the lungs every 6 (six) hours as needed for wheezing or shortness of breath.     amLODipine 5 MG tablet  Commonly known as:  NORVASC  Take 1 tablet (5 mg total) by mouth daily. Must keep appt for future refills     aspirin EC 81 MG tablet  Take 1 tablet (81 mg total) by mouth daily.     AZO CRANBERRY 250-30 MG Tabs  Take 1 capsule by mouth daily.     baclofen 10 MG tablet  Commonly known as:  LIORESAL  Take 5 mg by mouth every 8 (eight) hours as needed. For spasm     bisacodyl 5 MG EC tablet  Commonly known as:  DULCOLAX  Take 1 tablet (5 mg total) by mouth daily as needed for moderate constipation.     carbidopa-levodopa 25-100 MG tablet  Commonly known as:  SINEMET IR  1 1/2 tab by mouth three times daily 7a, 11a, 4 p for parkinson's disease     diazepam 2 MG tablet  Commonly known as:  VALIUM  1/2 - 1 tab by mouth twice per day as needed     escitalopram 10 MG tablet  Commonly known as:  LEXAPRO  Take 1 tablet (10 mg total) by mouth daily.     ferrous sulfate 325 (65 FE) MG tablet  Take 325 mg by mouth daily with breakfast.     fish oil-omega-3 fatty acids 1000 MG capsule  Take 1 g by mouth daily.     levofloxacin 250 MG tablet  Commonly known as:  LEVAQUIN  Take 1 tablet (250 mg total) by mouth daily.     omeprazole 20 MG capsule  Commonly known as:   PRILOSEC  Take 1 capsule (20 mg total) by mouth daily.     OSTEO ADVANCE Tabs  Take 2 tablets by mouth daily.     polyethylene glycol packet  Commonly known as:  MIRALAX / GLYCOLAX  Take 17 g by mouth daily as needed for mild constipation.     pravastatin 20 MG tablet  Commonly known as:  PRAVACHOL  Take 1 tablet (20 mg total) by mouth daily at 6 PM.     SYNTHROID 88 MCG tablet  Generic drug:  levothyroxine  Take 88 mcg by mouth daily before breakfast.       Allergies  Allergen Reactions  .  Relafen [Nabumetone] Diarrhea and Other (See Comments)    GI / Urinary Bleeding  . Lipitor [Atorvastatin] Other (See Comments)    Causes memory loss  . Other Other (See Comments)    "orvail" unknown:  Causes Bloody stool  . Penicillins Hives, Itching, Swelling and Rash    Has patient had a PCN reaction causing immediate rash, facial/tongue/throat swelling, SOB or lightheadedness with hypotension: no Has patient had a PCN reaction causing severe rash involving mucus membranes or skin necrosis: unknown Has patient had a PCN reaction that required hospitalization no Has patient had a PCN reaction occurring within the last 10 years: no If all of the above answers are "NO", then may proceed with Cephalosporin use.       The results of significant diagnostics from this hospitalization (including imaging, microbiology, ancillary and laboratory) are listed below for reference.    Significant Diagnostic Studies: Dg Chest 2 View  10/13/2015  CLINICAL DATA:  RIGHT-sided chest pain, leg spasms and ankle swelling, increased falls, hypertension, valvular heart disease, Parkinson's, asthma, COPD, pulmonary hypertension EXAM: CHEST  2 VIEW COMPARISON:  11/19/2014 FINDINGS: Normal heart size, mediastinal contours, and pulmonary vascularity. Atherosclerotic calcification aorta. Emphysematous and bronchitic changes consistent with COPD. No acute infiltrate, pleural effusion or pneumothorax. Bones  demineralized. IMPRESSION: COPD changes. No acute abnormalities. Electronically Signed   By: Lavonia Dana M.D.   On: 10/13/2015 11:19   Ct Head Wo Contrast  10/16/2015  CLINICAL DATA:  Weakness, onset tonight. EXAM: CT HEAD WITHOUT CONTRAST CT CERVICAL SPINE WITHOUT CONTRAST TECHNIQUE: Multidetector CT imaging of the head and cervical spine was performed following the standard protocol without intravenous contrast. Multiplanar CT image reconstructions of the cervical spine were also generated. COMPARISON:  07/07/2015 FINDINGS: CT HEAD FINDINGS There is no intracranial hemorrhage, mass or evidence of acute infarction. There is moderate generalized atrophy. There is moderate chronic microvascular ischemic change. There is no significant extra-axial fluid collection. No acute intracranial findings are evident. No interval change is evident from 07/07/2015. No bony abnormalities are evident. Visible paranasal sinuses are clear. CT CERVICAL SPINE FINDINGS The vertebral column, pedicles and facet articulations are intact. There is no evidence of acute fracture. No acute soft tissue abnormalities are evident. Moderately severe degenerative cervical disc disease is present from C3 through C7 with prominent osteophytes. Probable degenerative central canal stenosis at C4-5 and C5-6. IMPRESSION: 1. Negative for acute intracranial traumatic injury. There is moderate generalized atrophy and chronic microvascular white matter changes. 2. Negative for acute cervical spine fracture. Degenerative central canal stenosis in the midcervical spine. Electronically Signed   By: Andreas Newport M.D.   On: 10/16/2015 22:52   Ct Cervical Spine Wo Contrast  10/16/2015  CLINICAL DATA:  Weakness, onset tonight. EXAM: CT HEAD WITHOUT CONTRAST CT CERVICAL SPINE WITHOUT CONTRAST TECHNIQUE: Multidetector CT imaging of the head and cervical spine was performed following the standard protocol without intravenous contrast. Multiplanar CT image  reconstructions of the cervical spine were also generated. COMPARISON:  07/07/2015 FINDINGS: CT HEAD FINDINGS There is no intracranial hemorrhage, mass or evidence of acute infarction. There is moderate generalized atrophy. There is moderate chronic microvascular ischemic change. There is no significant extra-axial fluid collection. No acute intracranial findings are evident. No interval change is evident from 07/07/2015. No bony abnormalities are evident. Visible paranasal sinuses are clear. CT CERVICAL SPINE FINDINGS The vertebral column, pedicles and facet articulations are intact. There is no evidence of acute fracture. No acute soft tissue abnormalities are evident. Moderately severe  degenerative cervical disc disease is present from C3 through C7 with prominent osteophytes. Probable degenerative central canal stenosis at C4-5 and C5-6. IMPRESSION: 1. Negative for acute intracranial traumatic injury. There is moderate generalized atrophy and chronic microvascular white matter changes. 2. Negative for acute cervical spine fracture. Degenerative central canal stenosis in the midcervical spine. Electronically Signed   By: Andreas Newport M.D.   On: 10/16/2015 22:52   Ct Abdomen Pelvis W Contrast  10/17/2015  CLINICAL DATA:  81 year old female presenting with generalized weakness over the past month, worsening in the past week, with lower abdominal pain, history a and recent history of fall. Urinary tract infection. EXAM: CT ABDOMEN AND PELVIS WITH CONTRAST TECHNIQUE: Multidetector CT imaging of the abdomen and pelvis was performed using the standard protocol following bolus administration of intravenous contrast. CONTRAST:  1 OMNIPAQUE IOHEXOL 300 MG/ML SOLN, 14mL OMNIPAQUE IOHEXOL 300 MG/ML SOLN COMPARISON:  No priors. FINDINGS: Lower chest: Mild scarring in the lower lungs bilaterally. Small hiatal hernia. Hepatobiliary: Mild diffuse low attenuation throughout the hepatic parenchyma, compatible with mild  hepatic steatosis. No definite cystic or solid hepatic lesions. No intra or extrahepatic biliary ductal dilatation. Gallbladder is moderately dilated. Tiny calcified gallstones lying dependently in the gallbladder. No findings to suggest an acute cholecystitis at this time. Pancreas: No pancreatic mass. No pancreatic ductal dilatation. No pancreatic or peripancreatic fluid or inflammatory changes. Spleen: Unremarkable. Adrenals/Urinary Tract: The appearance of the kidneys and bilateral adrenal glands is normal. No hydroureteronephrosis. Foley balloon catheter is present in the lumen of the urinary bladder. Small amount of gas in the urinary bladder, presumably iatrogenic. Urinary bladder is otherwise unremarkable in appearance. Stomach/Bowel: The appearance of the stomach is normal. No pathologic dilatation of small bowel or colon. Two duodenal diverticulae from the second portion of the duodenum incidentally noted. No surrounding inflammatory changes are noted at this time. Several colonic diverticulae are also noted in the descending colon without surrounding inflammatory changes. Vascular/Lymphatic: Atherosclerosis throughout the abdominal and pelvic vasculature, without evidence of aneurysm or dissection. No lymphadenopathy noted in the abdomen or pelvis. Reproductive: Status post hysterectomy. Ovaries are not confidently identified may be surgically absent or atrophic. Other: No significant volume of ascites.  No pneumoperitoneum. Musculoskeletal: There are no aggressive appearing lytic or blastic lesions noted in the visualized portions of the skeleton. 7 mm of anterolisthesis of L4 upon L5. IMPRESSION: 1. No acute findings in the abdomen or pelvis to account for the patient's symptoms. 2. Mild colonic diverticulosis without evidence of acute diverticulitis at this time. 3. Cholelithiasis without evidence of acute cholecystitis at this time. 4. Mild hepatic steatosis. 5. Additional incidental findings, as  above. Electronically Signed   By: Vinnie Langton M.D.   On: 10/17/2015 19:14   Dg Chest Port 1 View  10/16/2015  CLINICAL DATA:  Initial evaluation for generalized weakness. EXAM: PORTABLE CHEST 1 VIEW COMPARISON:  Prior radiograph from 10/13/2015. FINDINGS: The cardiac and mediastinal silhouettes are stable in size and contour, and remain within normal limits. Mild atheromatous plaque at the aortic arch. Mild prominence of the right main PA, stable. The lungs are normally inflated. Changes related COPD noted. No airspace consolidation, pleural effusion, or pulmonary edema is identified. Curvilinear lucency overlying the right lung consistent with a skin fold. No pneumothorax. No acute osseous abnormality identified. IMPRESSION: No active disease. Electronically Signed   By: Jeannine Boga M.D.   On: 10/16/2015 22:41    Microbiology: Recent Results (from the past 240 hour(s))  Urine culture  Status: None   Collection Time: 10/16/15 10:01 PM  Result Value Ref Range Status   Specimen Description URINE, CLEAN CATCH  Final   Special Requests NONE  Final   Culture   Final    >=100,000 COLONIES/mL KLEBSIELLA PNEUMONIAE Performed at [facility]    Report Status 10/19/2015 FINAL  Final   Organism ID, Bacteria KLEBSIELLA PNEUMONIAE  Final      Susceptibility   Klebsiella pneumoniae - MIC*    AMPICILLIN 16 RESISTANT Resistant     CEFAZOLIN <=4 SENSITIVE Sensitive     CEFTRIAXONE <=1 SENSITIVE Sensitive     CIPROFLOXACIN <=0.25 SENSITIVE Sensitive     GENTAMICIN <=1 SENSITIVE Sensitive     IMIPENEM <=0.25 SENSITIVE Sensitive     NITROFURANTOIN <=16 SENSITIVE Sensitive     TRIMETH/SULFA <=20 SENSITIVE Sensitive     AMPICILLIN/SULBACTAM <=2 SENSITIVE Sensitive     PIP/TAZO <=4 SENSITIVE Sensitive     * >=100,000 COLONIES/mL KLEBSIELLA PNEUMONIAE  Culture, blood (routine x 2)     Status: None (Preliminary result)   Collection Time: 10/17/15 12:42 PM  Result Value Ref Range  Status   Specimen Description BLOOD LEFT HAND  Final   Special Requests IN PEDIATRIC BOTTLE 2CC  Final   Culture   Final    NO GROWTH 2 DAYS Performed at [facility]    Report Status PENDING  Incomplete  Culture, blood (routine x 2)     Status: None (Preliminary result)   Collection Time: 10/17/15 12:44 PM  Result Value Ref Range Status   Specimen Description BLOOD RIGHT HAND  Final   Special Requests BOTTLES DRAWN AEROBIC ONLY 5CC  Final   Culture   Final    NO GROWTH 2 DAYS Performed at [facility]    Report Status PENDING  Incomplete     Labs: Basic Metabolic Panel:  Recent Labs Lab 10/16/15 2250 10/18/15 0425 10/19/15 0411 10/20/15 0410  NA 141 138 138 139  K 4.5 3.8 4.0 3.8  CL 108 106 107 107  CO2 23 21* 22 23  GLUCOSE 114* 98 112* 87  BUN 23* 18 15 15   CREATININE 1.08* 1.07* 1.12* 1.02*  CALCIUM 9.4 8.5* 8.6* 8.7*   Liver Function Tests:  Recent Labs Lab 10/16/15 2250 10/18/15 0425  AST 46* 34  ALT 25 <5*  ALKPHOS 60 52  BILITOT 0.8 0.6  PROT 7.3 6.2*  ALBUMIN 3.9 3.3*    Recent Labs Lab 10/16/15 2250  LIPASE 20   No results for input(s): AMMONIA in the last 168 hours. CBC:  Recent Labs Lab 10/16/15 2210 10/18/15 0425 10/19/15 0411 10/20/15 0410  WBC 8.3 4.7 4.8 5.2  NEUTROABS 6.8 3.0  --   --   HGB 11.8* 10.2* 10.7* 10.6*  HCT 35.7* 29.6* 31.8* 30.9*  MCV 99.4 94.9 98.1 94.5  PLT 267 213 206 202   Cardiac Enzymes:  Recent Labs Lab 10/13/15 1349 10/16/15 2210  TROPONINI <0.03 <0.03   BNP: BNP (last 3 results) No results for input(s): BNP in the last 8760 hours.  ProBNP (last 3 results)  Recent Labs  12/06/14 1421  PROBNP 61.0    CBG:  Recent Labs Lab 10/17/15 0723 10/18/15 0719 10/19/15 0756 10/20/15 0720  GLUCAP 87 85 100* 91       Signed:  KRISHNAN,SENDIL K  [facility] 10/20/2015, 11:50 AM

## 2015-10-20 NOTE — Telephone Encounter (Signed)
Pt was on TCM list admitted for UTI sxs & Dementia. Pt d/c 10/20/15 sent to SNF...[name]

## 2015-10-20 NOTE — Progress Notes (Signed)
Physical Therapy Treatment Patient Details Name: [name] MRN: [id number] DOB: August 11, 1934 Today's Date: 10/20/2015    History of Present Illness [name] is a 81 y.o. female with PMH of Parkinson's disease, depression, anxiety, remote stroke, and hypothyroidism who presents to the ED 10/16/15 with 2 days of progressive generalized weakness, lower abdominal pain, and dysuria. Had been in her usual state of health, ambulatory without assistance, and until approximately 2 days PTA with  increasingly weak    PT Comments    Assisted OOB with increased time to [facility].  Assisted with amb a limited distance then positioned in recliner.   Follow Up Recommendations  SNF ([facility])     Equipment Recommendations       Recommendations for Other Services       Precautions / Restrictions Precautions Precautions: Fall Precaution Comments: Hx Parkinson's/dementia Restrictions Weight Bearing Restrictions: No    Mobility  Bed Mobility Overal bed mobility: Needs Assistance Bed Mobility: Supine to Sit     Supine to sit: +2 for safety/equipment;Mod assist Sit to supine: Max assist;+2 for physical assistance;+2 for safety/equipment   General bed mobility comments: use of bed pad to assist to edge, assist with trunk, multimodal cues   Transfers Overall transfer level: Needs assistance Equipment used: None Transfers: Sit to/from [employer] Transfers Sit to Stand: Max assist;+2 physical assistance;+2 safety/equipment Stand pivot transfers: Max assist;+2 physical assistance;+2 safety/equipment       General transfer comment: assisted to [facility]  Ambulation/Gait Ambulation/Gait assistance: Mod assist;Max assist;+2 physical assistance;+2 safety/equipment Ambulation Distance (Feet): 18 Feet Assistive device: Bilateral platform walker (EVA walker) Gait Pattern/deviations: Step-to pattern;Decreased step length - right;Decreased step length - left;Trunk flexed Gait velocity:  decreased   General Gait Details: used B platform EVA walker for increased support and to increase amb distance.  Recliner following for safety.   Stairs            Wheelchair Mobility    Modified Rankin (Stroke Patients Only)       Balance     Sitting balance-Leahy Scale: Poor       Standing balance-Leahy Scale: Poor                      Cognition Arousal/Alertness: Awake/alert Behavior During Therapy: Anxious                        Exercises      General Comments        Pertinent Vitals/Pain Pain Assessment: Faces Faces Pain Scale: Hurts even more Pain Location: LLQ ABD Pain Descriptors / Indicators: Cramping Pain Intervention(s): Monitored during session;Repositioned    Home Living Family/patient expects to be discharged to:: Skilled nursing facility Living Arrangements: Spouse/significant other;Children                  Prior Function Level of Independence: Independent with assistive device(s)      Comments: using 4 wheeled RW until recently with decline. Spouse has dementia   PT Goals (current goals can now be found in the care plan section) Acute Rehab PT Goals Patient Stated Goal: eventually get to my house Progress towards PT goals: Progressing toward goals    Frequency  Min 3X/week    PT Plan Current plan remains appropriate    Co-evaluation             End of Session Equipment Utilized During Treatment: Gait belt Activity Tolerance: Patient limited by fatigue;Patient limited by pain  Patient left: in chair;with call bell/phone within reach;with chair alarm set;with family/visitor present     Time: 0905-0930 PT Time Calculation (min) (ACUTE ONLY): 25 min  Charges:  $Gait Training: 8-22 mins $Therapeutic Activity: 8-22 mins                    G Codes:      Rica Koyanagi  PTA [facility]  Acute  Rehab Pager      [phone]

## 2015-10-20 NOTE — Progress Notes (Signed)
RN attempted to call report x2. Sat on hold both times.   [facility] called SW wanting report at 1530. RN then called [facility] back for a third time.     RN called [facility] SW to tell her that no report was communicated due to staff hanging up the phone.   [facility] transported patient to SNF.

## 2015-10-20 NOTE — Clinical Social Work Placement (Signed)
   CLINICAL SOCIAL WORK PLACEMENT  NOTE  Date:  10/20/2015  Patient Details  Name: [name] MRN: [id number] Date of Birth: 1935/04/16  Clinical Social Work is seeking post-discharge placement for this patient at the [place] level of care (*CSW will initial, date and re-position this form in  chart as items are completed):  Yes   Patient/family provided with [place] Work Department's list of facilities offering this level of care within the geographic area requested by the patient (or if unable, by the patient's family).  Yes   Patient/family informed of their freedom to choose among providers that offer the needed level of care, that participate in Medicare, Medicaid or managed care program needed by the patient, have an available bed and are willing to accept the patient.  Yes   Patient/family informed of [place]'s ownership interest in [facility] and [facility], as well as of the fact that they are under no obligation to receive care at these facilities.  PASRR submitted to EDS on       PASRR number received on       Existing PASRR number confirmed on 10/18/15     FL2 transmitted to all facilities in geographic area requested by pt/family on 10/18/15     FL2 transmitted to all facilities within larger geographic area on       Patient informed that his/her managed care company has contracts with or will negotiate with certain facilities, including the following:        Yes   Patient/family informed of bed offers received.  Patient chooses bed at [place], [place]     Physician recommends and patient chooses bed at      Patient to be transferred to [place] on 10/20/15.  Patient to be transferred to facility by [facility]     Patient family notified on 10/20/15 of transfer.  Name of family member notified:  DAUGTER     PHYSICIAN       Additional Comment: Pt / daughter are in agreement with d/c to  [facility] today. Pt/daughter are aware out of pocket costs may be associated with [facility] transport. Medical necessity form completed. Scripts included in d/c packet. D/C Summary sent to SNF for review prior to d/c. # for report provided to nsg.   _______________________________________________ Luretha Rued, [place]  [phone] 10/20/2015, 2:59 PM

## 2015-10-20 NOTE — Evaluation (Signed)
Occupational Therapy Evaluation Patient Details Name: [name] MRN: [id number] DOB: July 10, 1935 Today's Date: 10/20/2015    History of Present Illness [name] is a 81 y.o. female with PMH of Parkinson's disease, depression, anxiety, remote stroke, and hypothyroidism who presents to the ED 10/16/15 with 2 days of progressive generalized weakness, lower abdominal pain, and dysuria. Had been in her usual state of health, ambulatory without assistance, and until approximately 2 days PTA with  increasingly weak   Clinical Impression   Pt admitted with weakness. Pt currently with functional limitations due to the deficits listed below (see OT Problem List).  Pt will benefit from skilled OT to increase their safety and independence with ADL and functional mobility for ADL to facilitate discharge to venue listed below.      Follow Up Recommendations  SNF    Equipment Recommendations  None recommended by OT    Recommendations for Other Services       Precautions / Restrictions Precautions Precautions: Fall Precaution Comments: incontinent, takes lots of time      Mobility Bed Mobility Overal bed mobility: Needs Assistance Bed Mobility: Sit to Supine       Sit to supine: Max assist;+2 for physical assistance;+2 for safety/equipment      Transfers Overall transfer level: Needs assistance Equipment used: 2 person hand held assist Transfers: Sit to/from Stand Sit to Stand: Max assist;+2 physical assistance;+2 safety/equipment Stand pivot transfers: Max assist;+2 physical assistance;+2 safety/equipment       General transfer comment: increased time    Balance     Sitting balance-Leahy Scale: Poor       Standing balance-Leahy Scale: Poor                              ADL Overall ADL's : Needs assistance/impaired Eating/Feeding: Cueing for sequencing;Bed level;Minimal assistance Eating/Feeding Details (indicate cue type and reason): pt can do more  than she agrees to go at times Grooming: Minimal assistance;Sitting                   Toilet Transfer: BSC;+2 for physical assistance;+2 for safety/equipment;Moderate assistance   Toileting- Clothing Manipulation and Hygiene: Maximal assistance;Sit to/from stand;+2 for safety/equipment                         Pertinent Vitals/Pain Pain Assessment: No/denies pain        Extremity/Trunk Assessment Upper Extremity Assessment Upper Extremity Assessment: Generalized weakness RUE Deficits / Details: pt able to feed self with MAX encouragment- pt say she cant but she can           Communication Communication Communication: No difficulties   Cognition Arousal/Alertness: Awake/alert Behavior During Therapy: Anxious                                  Home Living Family/patient expects to be discharged to:: Skilled nursing facility Living Arrangements: Spouse/significant other;Children                                      Prior Functioning/Environment Level of Independence: Independent with assistive device(s)        Comments: using 4 wheeled RW until recently with decline. Spouse has dementia    OT Diagnosis: Generalized weakness   OT Problem List:  Decreased strength;Decreased activity tolerance;Decreased safety awareness;Impaired balance (sitting and/or standing)   OT Treatment/Interventions:      OT Goals(Current goals can be found in the care plan section) Acute Rehab OT Goals Patient Stated Goal: eventually get to my house OT Goal Formulation: With patient  OT Frequency:                End of Session Nurse Communication: Mobility status  Activity Tolerance: Patient limited by fatigue Patient left: in bed;with call bell/phone within reach;with nursing/sitter in room   Time: 1110-1125 OT Time Calculation (min): 15 min Charges:  OT General Charges $OT Visit: 1 Procedure OT Evaluation $OT Eval Moderate Complexity: 1  Procedure G-Codes:    Betsy Pries 10/21/15, 11:49 AM

## 2015-10-21 ENCOUNTER — Ambulatory Visit: Payer: Medicare Other | Admitting: Internal Medicine

## 2015-10-22 LAB — CULTURE, BLOOD (ROUTINE X 2)
CULTURE: NO GROWTH
CULTURE: NO GROWTH

## 2015-10-23 DIAGNOSIS — R2689 Other abnormalities of gait and mobility: Secondary | ICD-10-CM | POA: Diagnosis not present

## 2015-10-23 DIAGNOSIS — R5381 Other malaise: Secondary | ICD-10-CM | POA: Diagnosis not present

## 2015-10-23 DIAGNOSIS — G20 Parkinson's disease: Secondary | ICD-10-CM | POA: Diagnosis not present

## 2015-10-23 DIAGNOSIS — I10 Essential (primary) hypertension: Secondary | ICD-10-CM | POA: Diagnosis not present

## 2015-10-23 DIAGNOSIS — N390 Urinary tract infection, site not specified: Secondary | ICD-10-CM | POA: Diagnosis not present

## 2015-10-23 DIAGNOSIS — M542 Cervicalgia: Secondary | ICD-10-CM | POA: Diagnosis not present

## 2015-10-23 DIAGNOSIS — D6489 Other specified anemias: Secondary | ICD-10-CM | POA: Diagnosis not present

## 2015-10-28 ENCOUNTER — Other Ambulatory Visit: Payer: Self-pay | Admitting: Internal Medicine

## 2015-11-20 DIAGNOSIS — N390 Urinary tract infection, site not specified: Secondary | ICD-10-CM | POA: Diagnosis not present

## 2015-11-22 DIAGNOSIS — N390 Urinary tract infection, site not specified: Secondary | ICD-10-CM | POA: Diagnosis not present

## 2015-11-24 DIAGNOSIS — R262 Difficulty in walking, not elsewhere classified: Secondary | ICD-10-CM | POA: Diagnosis not present

## 2015-11-24 DIAGNOSIS — M6281 Muscle weakness (generalized): Secondary | ICD-10-CM | POA: Diagnosis not present

## 2015-11-28 DIAGNOSIS — N390 Urinary tract infection, site not specified: Secondary | ICD-10-CM | POA: Diagnosis not present

## 2015-11-28 DIAGNOSIS — E039 Hypothyroidism, unspecified: Secondary | ICD-10-CM | POA: Diagnosis not present

## 2015-11-28 DIAGNOSIS — F0280 Dementia in other diseases classified elsewhere without behavioral disturbance: Secondary | ICD-10-CM | POA: Diagnosis not present

## 2015-11-28 DIAGNOSIS — G9341 Metabolic encephalopathy: Secondary | ICD-10-CM | POA: Diagnosis not present

## 2015-11-28 DIAGNOSIS — G20 Parkinson's disease: Secondary | ICD-10-CM | POA: Diagnosis not present

## 2015-11-28 DIAGNOSIS — R262 Difficulty in walking, not elsewhere classified: Secondary | ICD-10-CM | POA: Diagnosis not present

## 2015-11-28 DIAGNOSIS — M6281 Muscle weakness (generalized): Secondary | ICD-10-CM | POA: Diagnosis not present

## 2015-11-28 DIAGNOSIS — R278 Other lack of coordination: Secondary | ICD-10-CM | POA: Diagnosis not present

## 2015-11-30 DIAGNOSIS — R278 Other lack of coordination: Secondary | ICD-10-CM | POA: Diagnosis not present

## 2015-11-30 DIAGNOSIS — R262 Difficulty in walking, not elsewhere classified: Secondary | ICD-10-CM | POA: Diagnosis not present

## 2015-11-30 DIAGNOSIS — G20 Parkinson's disease: Secondary | ICD-10-CM | POA: Diagnosis not present

## 2015-11-30 DIAGNOSIS — M6281 Muscle weakness (generalized): Secondary | ICD-10-CM | POA: Diagnosis not present

## 2015-12-01 DIAGNOSIS — N390 Urinary tract infection, site not specified: Secondary | ICD-10-CM | POA: Diagnosis not present

## 2015-12-02 DIAGNOSIS — R262 Difficulty in walking, not elsewhere classified: Secondary | ICD-10-CM | POA: Diagnosis not present

## 2015-12-02 DIAGNOSIS — R278 Other lack of coordination: Secondary | ICD-10-CM | POA: Diagnosis not present

## 2015-12-02 DIAGNOSIS — N390 Urinary tract infection, site not specified: Secondary | ICD-10-CM | POA: Diagnosis not present

## 2015-12-02 DIAGNOSIS — G20 Parkinson's disease: Secondary | ICD-10-CM | POA: Diagnosis not present

## 2015-12-02 DIAGNOSIS — M6281 Muscle weakness (generalized): Secondary | ICD-10-CM | POA: Diagnosis not present

## 2015-12-05 DIAGNOSIS — G20 Parkinson's disease: Secondary | ICD-10-CM | POA: Diagnosis not present

## 2015-12-05 DIAGNOSIS — R262 Difficulty in walking, not elsewhere classified: Secondary | ICD-10-CM | POA: Diagnosis not present

## 2015-12-05 DIAGNOSIS — R278 Other lack of coordination: Secondary | ICD-10-CM | POA: Diagnosis not present

## 2015-12-05 DIAGNOSIS — M6281 Muscle weakness (generalized): Secondary | ICD-10-CM | POA: Diagnosis not present

## 2015-12-07 DIAGNOSIS — M6281 Muscle weakness (generalized): Secondary | ICD-10-CM | POA: Diagnosis not present

## 2015-12-07 DIAGNOSIS — G20 Parkinson's disease: Secondary | ICD-10-CM | POA: Diagnosis not present

## 2015-12-07 DIAGNOSIS — R278 Other lack of coordination: Secondary | ICD-10-CM | POA: Diagnosis not present

## 2015-12-07 DIAGNOSIS — R262 Difficulty in walking, not elsewhere classified: Secondary | ICD-10-CM | POA: Diagnosis not present

## 2015-12-08 DIAGNOSIS — G20 Parkinson's disease: Secondary | ICD-10-CM | POA: Diagnosis not present

## 2015-12-08 DIAGNOSIS — M6281 Muscle weakness (generalized): Secondary | ICD-10-CM | POA: Diagnosis not present

## 2015-12-08 DIAGNOSIS — R278 Other lack of coordination: Secondary | ICD-10-CM | POA: Diagnosis not present

## 2015-12-08 DIAGNOSIS — R262 Difficulty in walking, not elsewhere classified: Secondary | ICD-10-CM | POA: Diagnosis not present

## 2015-12-12 DIAGNOSIS — R278 Other lack of coordination: Secondary | ICD-10-CM | POA: Diagnosis not present

## 2015-12-12 DIAGNOSIS — M6281 Muscle weakness (generalized): Secondary | ICD-10-CM | POA: Diagnosis not present

## 2015-12-12 DIAGNOSIS — R262 Difficulty in walking, not elsewhere classified: Secondary | ICD-10-CM | POA: Diagnosis not present

## 2015-12-12 DIAGNOSIS — G20 Parkinson's disease: Secondary | ICD-10-CM | POA: Diagnosis not present

## 2015-12-13 DIAGNOSIS — M6281 Muscle weakness (generalized): Secondary | ICD-10-CM | POA: Diagnosis not present

## 2015-12-13 DIAGNOSIS — G20 Parkinson's disease: Secondary | ICD-10-CM | POA: Diagnosis not present

## 2015-12-13 DIAGNOSIS — R278 Other lack of coordination: Secondary | ICD-10-CM | POA: Diagnosis not present

## 2015-12-13 DIAGNOSIS — R262 Difficulty in walking, not elsewhere classified: Secondary | ICD-10-CM | POA: Diagnosis not present

## 2015-12-15 DIAGNOSIS — R262 Difficulty in walking, not elsewhere classified: Secondary | ICD-10-CM | POA: Diagnosis not present

## 2015-12-15 DIAGNOSIS — G20 Parkinson's disease: Secondary | ICD-10-CM | POA: Diagnosis not present

## 2015-12-15 DIAGNOSIS — R278 Other lack of coordination: Secondary | ICD-10-CM | POA: Diagnosis not present

## 2015-12-15 DIAGNOSIS — M6281 Muscle weakness (generalized): Secondary | ICD-10-CM | POA: Diagnosis not present

## 2015-12-19 DIAGNOSIS — R278 Other lack of coordination: Secondary | ICD-10-CM | POA: Diagnosis not present

## 2015-12-19 DIAGNOSIS — M6281 Muscle weakness (generalized): Secondary | ICD-10-CM | POA: Diagnosis not present

## 2015-12-19 DIAGNOSIS — R262 Difficulty in walking, not elsewhere classified: Secondary | ICD-10-CM | POA: Diagnosis not present

## 2015-12-19 DIAGNOSIS — G20 Parkinson's disease: Secondary | ICD-10-CM | POA: Diagnosis not present

## 2015-12-20 DIAGNOSIS — M6281 Muscle weakness (generalized): Secondary | ICD-10-CM | POA: Diagnosis not present

## 2015-12-20 DIAGNOSIS — G20 Parkinson's disease: Secondary | ICD-10-CM | POA: Diagnosis not present

## 2015-12-20 DIAGNOSIS — R262 Difficulty in walking, not elsewhere classified: Secondary | ICD-10-CM | POA: Diagnosis not present

## 2015-12-20 DIAGNOSIS — R278 Other lack of coordination: Secondary | ICD-10-CM | POA: Diagnosis not present

## 2016-01-05 DIAGNOSIS — N390 Urinary tract infection, site not specified: Secondary | ICD-10-CM | POA: Diagnosis not present

## 2016-01-28 DIAGNOSIS — M6281 Muscle weakness (generalized): Secondary | ICD-10-CM | POA: Diagnosis not present

## 2016-01-28 DIAGNOSIS — M5030 Other cervical disc degeneration, unspecified cervical region: Secondary | ICD-10-CM | POA: Diagnosis not present

## 2016-01-28 DIAGNOSIS — R293 Abnormal posture: Secondary | ICD-10-CM | POA: Diagnosis not present

## 2016-01-28 DIAGNOSIS — I10 Essential (primary) hypertension: Secondary | ICD-10-CM | POA: Diagnosis not present

## 2016-01-28 DIAGNOSIS — Z9181 History of falling: Secondary | ICD-10-CM | POA: Diagnosis not present

## 2016-01-28 DIAGNOSIS — G309 Alzheimer's disease, unspecified: Secondary | ICD-10-CM | POA: Diagnosis not present

## 2016-01-28 DIAGNOSIS — F0280 Dementia in other diseases classified elsewhere without behavioral disturbance: Secondary | ICD-10-CM | POA: Diagnosis not present

## 2016-01-28 DIAGNOSIS — G20 Parkinson's disease: Secondary | ICD-10-CM | POA: Diagnosis not present

## 2016-01-28 DIAGNOSIS — Z7982 Long term (current) use of aspirin: Secondary | ICD-10-CM | POA: Diagnosis not present

## 2016-01-28 DIAGNOSIS — J449 Chronic obstructive pulmonary disease, unspecified: Secondary | ICD-10-CM | POA: Diagnosis not present

## 2016-01-28 DIAGNOSIS — F418 Other specified anxiety disorders: Secondary | ICD-10-CM | POA: Diagnosis not present

## 2016-01-30 DIAGNOSIS — I10 Essential (primary) hypertension: Secondary | ICD-10-CM | POA: Diagnosis not present

## 2016-01-30 DIAGNOSIS — M5030 Other cervical disc degeneration, unspecified cervical region: Secondary | ICD-10-CM | POA: Diagnosis not present

## 2016-01-30 DIAGNOSIS — G20 Parkinson's disease: Secondary | ICD-10-CM | POA: Diagnosis not present

## 2016-01-30 DIAGNOSIS — G309 Alzheimer's disease, unspecified: Secondary | ICD-10-CM | POA: Diagnosis not present

## 2016-01-30 DIAGNOSIS — M6281 Muscle weakness (generalized): Secondary | ICD-10-CM | POA: Diagnosis not present

## 2016-01-30 DIAGNOSIS — F0280 Dementia in other diseases classified elsewhere without behavioral disturbance: Secondary | ICD-10-CM | POA: Diagnosis not present

## 2016-02-01 DIAGNOSIS — G20 Parkinson's disease: Secondary | ICD-10-CM | POA: Diagnosis not present

## 2016-02-01 DIAGNOSIS — F0280 Dementia in other diseases classified elsewhere without behavioral disturbance: Secondary | ICD-10-CM | POA: Diagnosis not present

## 2016-02-01 DIAGNOSIS — M6281 Muscle weakness (generalized): Secondary | ICD-10-CM | POA: Diagnosis not present

## 2016-02-01 DIAGNOSIS — M5030 Other cervical disc degeneration, unspecified cervical region: Secondary | ICD-10-CM | POA: Diagnosis not present

## 2016-02-01 DIAGNOSIS — I10 Essential (primary) hypertension: Secondary | ICD-10-CM | POA: Diagnosis not present

## 2016-02-01 DIAGNOSIS — G309 Alzheimer's disease, unspecified: Secondary | ICD-10-CM | POA: Diagnosis not present

## 2016-02-03 DIAGNOSIS — G20 Parkinson's disease: Secondary | ICD-10-CM | POA: Diagnosis not present

## 2016-02-03 DIAGNOSIS — M5030 Other cervical disc degeneration, unspecified cervical region: Secondary | ICD-10-CM | POA: Diagnosis not present

## 2016-02-03 DIAGNOSIS — M6281 Muscle weakness (generalized): Secondary | ICD-10-CM | POA: Diagnosis not present

## 2016-02-03 DIAGNOSIS — F0280 Dementia in other diseases classified elsewhere without behavioral disturbance: Secondary | ICD-10-CM | POA: Diagnosis not present

## 2016-02-03 DIAGNOSIS — G309 Alzheimer's disease, unspecified: Secondary | ICD-10-CM | POA: Diagnosis not present

## 2016-02-03 DIAGNOSIS — I10 Essential (primary) hypertension: Secondary | ICD-10-CM | POA: Diagnosis not present

## 2016-02-06 DIAGNOSIS — I10 Essential (primary) hypertension: Secondary | ICD-10-CM | POA: Diagnosis not present

## 2016-02-06 DIAGNOSIS — G309 Alzheimer's disease, unspecified: Secondary | ICD-10-CM | POA: Diagnosis not present

## 2016-02-06 DIAGNOSIS — M5030 Other cervical disc degeneration, unspecified cervical region: Secondary | ICD-10-CM | POA: Diagnosis not present

## 2016-02-06 DIAGNOSIS — M6281 Muscle weakness (generalized): Secondary | ICD-10-CM | POA: Diagnosis not present

## 2016-02-06 DIAGNOSIS — G20 Parkinson's disease: Secondary | ICD-10-CM | POA: Diagnosis not present

## 2016-02-06 DIAGNOSIS — F0280 Dementia in other diseases classified elsewhere without behavioral disturbance: Secondary | ICD-10-CM | POA: Diagnosis not present

## 2016-02-08 DIAGNOSIS — G20 Parkinson's disease: Secondary | ICD-10-CM | POA: Diagnosis not present

## 2016-02-08 DIAGNOSIS — M5030 Other cervical disc degeneration, unspecified cervical region: Secondary | ICD-10-CM | POA: Diagnosis not present

## 2016-02-08 DIAGNOSIS — M6281 Muscle weakness (generalized): Secondary | ICD-10-CM | POA: Diagnosis not present

## 2016-02-08 DIAGNOSIS — I10 Essential (primary) hypertension: Secondary | ICD-10-CM | POA: Diagnosis not present

## 2016-02-08 DIAGNOSIS — G309 Alzheimer's disease, unspecified: Secondary | ICD-10-CM | POA: Diagnosis not present

## 2016-02-08 DIAGNOSIS — F0280 Dementia in other diseases classified elsewhere without behavioral disturbance: Secondary | ICD-10-CM | POA: Diagnosis not present

## 2016-02-09 DIAGNOSIS — G20 Parkinson's disease: Secondary | ICD-10-CM | POA: Diagnosis not present

## 2016-02-09 DIAGNOSIS — M6281 Muscle weakness (generalized): Secondary | ICD-10-CM | POA: Diagnosis not present

## 2016-02-10 DIAGNOSIS — G309 Alzheimer's disease, unspecified: Secondary | ICD-10-CM | POA: Diagnosis not present

## 2016-02-10 DIAGNOSIS — M6281 Muscle weakness (generalized): Secondary | ICD-10-CM | POA: Diagnosis not present

## 2016-02-10 DIAGNOSIS — F0280 Dementia in other diseases classified elsewhere without behavioral disturbance: Secondary | ICD-10-CM | POA: Diagnosis not present

## 2016-02-10 DIAGNOSIS — M5030 Other cervical disc degeneration, unspecified cervical region: Secondary | ICD-10-CM | POA: Diagnosis not present

## 2016-02-10 DIAGNOSIS — G20 Parkinson's disease: Secondary | ICD-10-CM | POA: Diagnosis not present

## 2016-02-10 DIAGNOSIS — I10 Essential (primary) hypertension: Secondary | ICD-10-CM | POA: Diagnosis not present

## 2016-02-14 DIAGNOSIS — G309 Alzheimer's disease, unspecified: Secondary | ICD-10-CM | POA: Diagnosis not present

## 2016-02-14 DIAGNOSIS — M5030 Other cervical disc degeneration, unspecified cervical region: Secondary | ICD-10-CM | POA: Diagnosis not present

## 2016-02-14 DIAGNOSIS — I10 Essential (primary) hypertension: Secondary | ICD-10-CM | POA: Diagnosis not present

## 2016-02-14 DIAGNOSIS — F0280 Dementia in other diseases classified elsewhere without behavioral disturbance: Secondary | ICD-10-CM | POA: Diagnosis not present

## 2016-02-14 DIAGNOSIS — M6281 Muscle weakness (generalized): Secondary | ICD-10-CM | POA: Diagnosis not present

## 2016-02-14 DIAGNOSIS — G20 Parkinson's disease: Secondary | ICD-10-CM | POA: Diagnosis not present

## 2016-02-15 DIAGNOSIS — F0280 Dementia in other diseases classified elsewhere without behavioral disturbance: Secondary | ICD-10-CM | POA: Diagnosis not present

## 2016-02-15 DIAGNOSIS — I10 Essential (primary) hypertension: Secondary | ICD-10-CM | POA: Diagnosis not present

## 2016-02-15 DIAGNOSIS — G20 Parkinson's disease: Secondary | ICD-10-CM | POA: Diagnosis not present

## 2016-02-15 DIAGNOSIS — M6281 Muscle weakness (generalized): Secondary | ICD-10-CM | POA: Diagnosis not present

## 2016-02-15 DIAGNOSIS — M5030 Other cervical disc degeneration, unspecified cervical region: Secondary | ICD-10-CM | POA: Diagnosis not present

## 2016-02-15 DIAGNOSIS — G309 Alzheimer's disease, unspecified: Secondary | ICD-10-CM | POA: Diagnosis not present

## 2016-02-16 ENCOUNTER — Ambulatory Visit (INDEPENDENT_AMBULATORY_CARE_PROVIDER_SITE_OTHER): Payer: Medicare Other | Admitting: Neurology

## 2016-02-16 ENCOUNTER — Encounter: Payer: Self-pay | Admitting: Neurology

## 2016-02-16 VITALS — BP 120/60 | HR 85 | Ht 65.0 in | Wt 160.0 lb

## 2016-02-16 DIAGNOSIS — G20 Parkinson's disease: Secondary | ICD-10-CM

## 2016-02-16 DIAGNOSIS — G20A1 Parkinson's disease without dyskinesia, without mention of fluctuations: Secondary | ICD-10-CM

## 2016-02-16 DIAGNOSIS — R1310 Dysphagia, unspecified: Secondary | ICD-10-CM | POA: Diagnosis not present

## 2016-02-16 NOTE — Progress Notes (Signed)
[name] was seen today in the movement disorders clinic for neurologic consultation at the request of [name]., MD.   The patient is accompanied by her daughter and granddaughter who supplements the history.  The patient is seen today in neurologic consultation for possible Parkinson's disease.  I have reviewed an extensive number of records made available to me.  The patient was hospitalized on December 8 after a fall 2 days prior to admission.  The fall occurred after the patient got up at night while attempting to use the bathroom.  She did apparently taken a Valium earlier that night.  This was just one of many falls that she had sustained.  Her family had questioned whether or not she had had Parkinson's disease and a neurologic consultation was obtained while she was in the hospital.  The patient saw Dr. Leonel Ramsay and he felt that her symptoms were consistent with possible Parkinson's disease.  She was started on levodopa, 1 po tid and told to follow-up here.  Pt and family indicate that it has helped walking and tremor.  She is currently in skilled rehab for 30 days but is only in for short term and has only been there for 1 day.    The patient has previously seen [facility] neurology and last saw [facility] neurology on 03/15/2015.  She has seen Dr. [name] as well as several of his nurse practitioners.  I reviewed those records in detail.  She had a TIA on 01/03/2013 but there was no tremor when seen by [facility] neurology in July, 2014.  On her follow-up in January, 2015 notes indicate that "action tremor" had developed in the right hand and a resting tremor had developed in the right foot.  In February, 2016 notes indicate that the patient now had decreased arm swing and a mild resting tremor of the right hand and Mysoline was offered to the patient but she did not want that.  On 03/15/2015 the patient reported more falls and it was recommended that they just monitor the tremor.  No new  medications or therapy were given.  She reports fam hx of PD in maternal aunt.   09/19/15 update:  The patient presents today for follow-up, accompanied by her daughter who supplements the history.  I have reviewed prior records made available to me.  Last visit, her levodopa was increased to 1-1/2 tablets 3 times per day and it was recommended that she take these at 7 AM/11 AM/4 PM.  She did not do this and is still taking it tid and spreading out the last to take at bedtime.  States that she remembers when she was in the SNF she knew she was taking an extra 1/2 tablet of something but didn't know what.  She denies any falls since our last visit.  No hallucinations.  No lightheadedness or near syncope.  She did have a modified barium swallow on 08/05/2015.  This was normal.  It was noted that her respiratory status may allow for episodic aspiration due to discoordination of swallow/apneic period.  I did note from SNF records that the patient was on valium 2 mg prn for tremor.   Is still taking it at home but isn't sure how often but thinks one time per day. 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  She is accompanied by her daughter and granddaughter  who supplement the history.  Last visit, her levodopa was increased to 1-1/2 tablets 3 times per day and it was recommended that she take these at 7 AM/11 AM/4 PM. She actually takes these at 7:30/noon/and 6pm (1 hour after supper).   I talked extensively to her last visit about the fact that I did not like her living situation, which was living with her husband who had Alzheimer's disease, given the fact that the patient's own memory was impaired.  I have reviewed  records made available to me since last visit.  She was hospitalized from March 20 to March 23 with a urinary tract infection and discharged to a subacute nursing facility.  Reports that she  is in still in assisted living and "that is my home now."  They help her at night with getting up and they cook the food.  She is able to get herself dressed.  She gets assistance with bathing.  She gets some PT a few times a week and otherwise is in the [facility] of the time.  She had a bedside swallow evaluation on March 21 recommended mechanical soft diet with thin liquids.  No falls.  No hallucinations.     ALLERGIES:   Allergies  Allergen Reactions  . Relafen [Nabumetone] Diarrhea and Other (See Comments)    GI / Urinary Bleeding  . Lipitor [Atorvastatin] Other (See Comments)    Causes memory loss  . Other Other (See Comments)    "orvail" unknown:  Causes Bloody stool  . Penicillins Hives, Itching, Swelling and Rash    Has patient had a PCN reaction causing immediate rash, facial/tongue/throat swelling, SOB or lightheadedness with hypotension: no Has patient had a PCN reaction causing severe rash involving mucus membranes or skin necrosis: unknown Has patient had a PCN reaction that required hospitalization no Has patient had a PCN reaction occurring within the last 10 years: no If all of the above answers are "NO", then may proceed with Cephalosporin use.     CURRENT MEDICATIONS:  Outpatient Encounter Prescriptions as of 02/16/2016  Medication Sig  . ADVAIR HFA 230-21 MCG/ACT inhaler Inhale 2 puffs into the lungs 2 (two) times daily.  Marland Kitchen albuterol (PROVENTIL HFA;VENTOLIN HFA) 108 (90 BASE) MCG/ACT inhaler Inhale 2 puffs into the lungs every 6 (six) hours as needed for wheezing or shortness of breath.  Marland Kitchen amLODipine (NORVASC) 5 MG tablet Take 1 tablet (5 mg total) by mouth daily. Must keep appt for future refills  . aspirin EC 81 MG tablet Take 1 tablet (81 mg total) by mouth daily.  . baclofen (LIORESAL) 10 MG tablet Take 5 mg by mouth every 8 (eight) hours as needed. For spasm  . benzonatate (TESSALON) 100 MG capsule Take by mouth 3 (three) times daily as needed for cough.  .  bisacodyl (DULCOLAX) 5 MG EC tablet Take 1 tablet (5 mg total) by mouth daily as needed for moderate constipation.  . carbidopa-levodopa (SINEMET IR) 25-100 MG tablet 1 1/2 tab by mouth three times daily 7a, 11a, 4 p for parkinson's disease  . Cranberry-Vitamin C-Probiotic (AZO CRANBERRY) 250-30 MG TABS Take 1 capsule by mouth daily.  . diazepam (VALIUM) 2 MG tablet 1/2 - 1 tab by mouth twice per day as needed  . escitalopram (LEXAPRO) 10 MG tablet Take 1 tablet (10 mg total) by mouth daily. (Patient taking differently: Take 20 mg by mouth daily. )  . ferrous sulfate 325 (65 FE) MG tablet Take 325 mg by mouth daily with  breakfast.  . fish oil-omega-3 fatty acids 1000 MG capsule Take 1 g by mouth daily.   . fluticasone (FLONASE) 50 MCG/ACT nasal spray Place into both nostrils daily.  Marland Kitchen levothyroxine (SYNTHROID) 88 MCG tablet Take 88 mcg by mouth daily before breakfast.  . meloxicam (MOBIC) 15 MG tablet Take 15 mg by mouth daily.  Marland Kitchen omeprazole (PRILOSEC) 20 MG capsule Take 1 capsule (20 mg total) by mouth daily.  Heide Spark Shell 500 MG TABS Take by mouth.  . polyethylene glycol (MIRALAX / GLYCOLAX) packet Take 17 g by mouth daily as needed for mild constipation.  . pravastatin (PRAVACHOL) 20 MG tablet Take 1 tablet (20 mg total) by mouth daily at 6 PM.  . sucralfate (CARAFATE) 1 g tablet Take 1 g by mouth 4 (four) times daily -  with meals and at bedtime.  . [DISCONTINUED] levofloxacin (LEVAQUIN) 250 MG tablet Take 1 tablet (250 mg total) by mouth daily.  . [DISCONTINUED] levothyroxine (SYNTHROID, LEVOTHROID) 50 MCG tablet TAKE 1 TABLET DAILY  . [DISCONTINUED] Nutritional Supplements (OSTEO ADVANCE) TABS Take 2 tablets by mouth daily.    No facility-administered encounter medications on file as of 02/16/2016.    PAST MEDICAL HISTORY:   Past Medical History  Diagnosis Date  . History of sudden visual loss   . Acute cystitis   . Bronchitis, acute   . Hypertension   . Cerebrovascular disease     . Hypothyroidism   . GERD (gastroesophageal reflux disease)   . Diverticulosis of colon   . DJD (degenerative joint disease)   . Back pain   . Transient ischemic attack   . Anxiety   . Depression   . Asthma   . COPD (chronic obstructive pulmonary disease) ([place])   . Shortness of breath     with exertion   . Pneumonia 2015    hx of x 2   . 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 UTI (lower urinary tract infection)     hx of   . Carotid artery occlusion   . Hyperlipidemia   . Aortic stenosis, mild 11/18/2013  . Pulmonary hypertension, moderate to severe ([place]) 11/19/2014  . Anemia   . Fall 07/05/2015    PAST SURGICAL HISTORY:   Past Surgical History  Procedure Laterality Date  . Vaginal hysterectomy    . Colectomy    . Tubal ligation    . Replacement total knee      right and left knee  . Cardiothoracic procedure  01/13/2009    Right   . Foot surgery      nerve cut between toes   . Endarterectomy      right carotid endartarectomy - 2010  . Total knee arthroplasty Left 11/17/2012    Procedure: TOTAL KNEE ARTHROPLASTY;  Surgeon: Gearlean Alf, MD;  Location: [facility];  Service: Orthopedics;  Laterality: Left;  . Tee without cardioversion N/A 02/08/2014    Procedure: TRANSESOPHAGEAL ECHOCARDIOGRAM (TEE);  Surgeon: Larey Dresser, MD;  Location: [facility];  Service: Cardiovascular;  Laterality: N/A;  . Cardiac catheterization  02/18/14  . Left and right heart catheterization with coronary angiogram N/A 02/18/2014    Procedure: LEFT AND RIGHT HEART CATHETERIZATION WITH CORONARY ANGIOGRAM;  Surgeon: Larey Dresser, MD;  Location: [facility] C[facility] LAB;  Service: Cardiovascular;  Laterality: N/A;    SOCIAL HISTORY:   Social History   Social History  . Marital Status: Married    Spouse Name:  charles  . Number of Children: 3  . Years of Education: N/A   Occupational History  . Not on file.   Social History Main Topics  . Smoking status: Never Smoker   . Smokeless  tobacco: Never Used  . Alcohol Use: No  . Drug Use: No  . Sexual Activity: Not on file   Other Topics Concern  . Not on file   Social History Narrative    FAMILY HISTORY:   Family Status  Relation Status Death Age  . Father Deceased     Emphysema  . Mother Deceased     CHF  . Sister Alive     heart disease  . Brother Alive     COPD  . Sister Alive     COPD    ROS:  Admits to shortness of breath.  A complete 10 system review of systems was obtained and was unremarkable apart from what is mentioned above.  PHYSICAL EXAMINATION:    VITALS:   Filed Vitals:   02/16/16 1400  BP: 120/60  Pulse: 85  Height: 5\' 5"  (1.651 m)  Weight: 160 lb (72.576 kg)  SpO2: 92%    GEN:  The patient appears stated age and is in NAD.  There is an unpleasant odor in the room. HEENT:  Normocephalic, atraumatic.  The mucous membranes are moist. The superficial temporal arteries are without ropiness or tenderness. CV:  RRR Lungs:  CTAB.  She is visibly short of breath.  This gets worse with exertion. Neck/HEME:  There are no carotid bruits bilaterally.  Neurological examination:  Orientation:  Montreal Cognitive Assessment  02/16/2016  Visuospatial/ Executive (0/5) 3  Naming (0/3) 3  Attention: Read list of digits (0/2) 2  Attention: Read list of letters (0/1) 1  Attention: Serial 7 subtraction starting at 100 (0/3) 1  Language: Repeat phrase (0/2) 2  Language : Fluency (0/1) 1  Abstraction (0/2) 2  Delayed Recall (0/5) 2  Orientation (0/6) 6  Total 23  Adjusted Score (based on education) 24    Cranial nerves: There is good facial symmetry.  There is significant facial hypomimia.  The speech is fluent and clear.  She is hypophonic.  Soft palate rises symmetrically and there is no tongue deviation. Hearing is intact to conversational tone. Sensation: Sensation is intact to light touch throughout. Motor: Strength is 5/5 in the bilateral upper and lower extremities.   Shoulder shrug is  equal and symmetric.  There is no pronator drift.   Movement examination: Tone: There is no rigidity today.  Tone in the left upper extremity was normal.   Abnormal movements: There is a mild right upper extremity resting tremor and RLE resting tremor Coordination:  There is no significant decremation today. Gait and Station: The patient gets out of the chair with the walker well.  She shuffles minimally.    ASSESSMENT/PLAN:  1.  Idiopathic Parkinson's disease.  While this is just diagnosed in 06/2015, she has had documented sx's since at least 07/2013.    -She is to continue carbidopa/levodopa 25/100 1-1/2 tablets 3 times a day and I wrote the assisted living facility a note to give this before the meals instead of after it, as they are currently doing. 2.  Dysphagia  -She did have a modified barium swallow on 08/05/2015.  This was normal.  It was noted that her respiratory status may allow for episodic aspiration due to discoordination of swallow/apneic period.  Has to drink out of straw or will  choke and is complying with that 3.  Shortness of breath  -This is going to significantly limit her ability to participate with therapy.   4.  Follow up is anticipated in the next few months, sooner should new neurologic issues arise.   Much greater than 50% of this visit was spent in counseling with the patient and the family.  Total face to face time:  25 min

## 2016-02-17 DIAGNOSIS — I10 Essential (primary) hypertension: Secondary | ICD-10-CM | POA: Diagnosis not present

## 2016-02-17 DIAGNOSIS — M6281 Muscle weakness (generalized): Secondary | ICD-10-CM | POA: Diagnosis not present

## 2016-02-17 DIAGNOSIS — M5030 Other cervical disc degeneration, unspecified cervical region: Secondary | ICD-10-CM | POA: Diagnosis not present

## 2016-02-17 DIAGNOSIS — G20 Parkinson's disease: Secondary | ICD-10-CM | POA: Diagnosis not present

## 2016-02-17 DIAGNOSIS — F0280 Dementia in other diseases classified elsewhere without behavioral disturbance: Secondary | ICD-10-CM | POA: Diagnosis not present

## 2016-02-17 DIAGNOSIS — G309 Alzheimer's disease, unspecified: Secondary | ICD-10-CM | POA: Diagnosis not present

## 2016-02-20 DIAGNOSIS — M6281 Muscle weakness (generalized): Secondary | ICD-10-CM | POA: Diagnosis not present

## 2016-02-20 DIAGNOSIS — G309 Alzheimer's disease, unspecified: Secondary | ICD-10-CM | POA: Diagnosis not present

## 2016-02-20 DIAGNOSIS — M5030 Other cervical disc degeneration, unspecified cervical region: Secondary | ICD-10-CM | POA: Diagnosis not present

## 2016-02-20 DIAGNOSIS — I10 Essential (primary) hypertension: Secondary | ICD-10-CM | POA: Diagnosis not present

## 2016-02-20 DIAGNOSIS — G20 Parkinson's disease: Secondary | ICD-10-CM | POA: Diagnosis not present

## 2016-02-20 DIAGNOSIS — F0280 Dementia in other diseases classified elsewhere without behavioral disturbance: Secondary | ICD-10-CM | POA: Diagnosis not present

## 2016-02-21 DIAGNOSIS — G20 Parkinson's disease: Secondary | ICD-10-CM | POA: Diagnosis not present

## 2016-02-21 DIAGNOSIS — G309 Alzheimer's disease, unspecified: Secondary | ICD-10-CM | POA: Diagnosis not present

## 2016-02-21 DIAGNOSIS — F0280 Dementia in other diseases classified elsewhere without behavioral disturbance: Secondary | ICD-10-CM | POA: Diagnosis not present

## 2016-02-21 DIAGNOSIS — I10 Essential (primary) hypertension: Secondary | ICD-10-CM | POA: Diagnosis not present

## 2016-02-21 DIAGNOSIS — M6281 Muscle weakness (generalized): Secondary | ICD-10-CM | POA: Diagnosis not present

## 2016-02-21 DIAGNOSIS — M5030 Other cervical disc degeneration, unspecified cervical region: Secondary | ICD-10-CM | POA: Diagnosis not present

## 2016-02-24 DIAGNOSIS — F0280 Dementia in other diseases classified elsewhere without behavioral disturbance: Secondary | ICD-10-CM | POA: Diagnosis not present

## 2016-02-24 DIAGNOSIS — I10 Essential (primary) hypertension: Secondary | ICD-10-CM | POA: Diagnosis not present

## 2016-02-24 DIAGNOSIS — G309 Alzheimer's disease, unspecified: Secondary | ICD-10-CM | POA: Diagnosis not present

## 2016-02-24 DIAGNOSIS — G20 Parkinson's disease: Secondary | ICD-10-CM | POA: Diagnosis not present

## 2016-02-24 DIAGNOSIS — M5030 Other cervical disc degeneration, unspecified cervical region: Secondary | ICD-10-CM | POA: Diagnosis not present

## 2016-02-24 DIAGNOSIS — M6281 Muscle weakness (generalized): Secondary | ICD-10-CM | POA: Diagnosis not present

## 2016-02-27 DIAGNOSIS — M6281 Muscle weakness (generalized): Secondary | ICD-10-CM | POA: Diagnosis not present

## 2016-02-27 DIAGNOSIS — M5030 Other cervical disc degeneration, unspecified cervical region: Secondary | ICD-10-CM | POA: Diagnosis not present

## 2016-02-27 DIAGNOSIS — G20 Parkinson's disease: Secondary | ICD-10-CM | POA: Diagnosis not present

## 2016-02-27 DIAGNOSIS — I10 Essential (primary) hypertension: Secondary | ICD-10-CM | POA: Diagnosis not present

## 2016-02-27 DIAGNOSIS — F0280 Dementia in other diseases classified elsewhere without behavioral disturbance: Secondary | ICD-10-CM | POA: Diagnosis not present

## 2016-02-27 DIAGNOSIS — G309 Alzheimer's disease, unspecified: Secondary | ICD-10-CM | POA: Diagnosis not present

## 2016-03-01 DIAGNOSIS — M5030 Other cervical disc degeneration, unspecified cervical region: Secondary | ICD-10-CM | POA: Diagnosis not present

## 2016-03-01 DIAGNOSIS — M542 Cervicalgia: Secondary | ICD-10-CM | POA: Diagnosis not present

## 2016-03-01 DIAGNOSIS — G20 Parkinson's disease: Secondary | ICD-10-CM | POA: Diagnosis not present

## 2016-03-01 DIAGNOSIS — F0280 Dementia in other diseases classified elsewhere without behavioral disturbance: Secondary | ICD-10-CM | POA: Diagnosis not present

## 2016-03-01 DIAGNOSIS — M6281 Muscle weakness (generalized): Secondary | ICD-10-CM | POA: Diagnosis not present

## 2016-03-01 DIAGNOSIS — G309 Alzheimer's disease, unspecified: Secondary | ICD-10-CM | POA: Diagnosis not present

## 2016-03-01 DIAGNOSIS — I10 Essential (primary) hypertension: Secondary | ICD-10-CM | POA: Diagnosis not present

## 2016-03-05 DIAGNOSIS — G20 Parkinson's disease: Secondary | ICD-10-CM | POA: Diagnosis not present

## 2016-03-05 DIAGNOSIS — M791 Myalgia: Secondary | ICD-10-CM | POA: Diagnosis not present

## 2016-03-05 DIAGNOSIS — M4722 Other spondylosis with radiculopathy, cervical region: Secondary | ICD-10-CM | POA: Diagnosis not present

## 2016-03-06 DIAGNOSIS — F0280 Dementia in other diseases classified elsewhere without behavioral disturbance: Secondary | ICD-10-CM | POA: Diagnosis not present

## 2016-03-06 DIAGNOSIS — M6281 Muscle weakness (generalized): Secondary | ICD-10-CM | POA: Diagnosis not present

## 2016-03-06 DIAGNOSIS — I10 Essential (primary) hypertension: Secondary | ICD-10-CM | POA: Diagnosis not present

## 2016-03-06 DIAGNOSIS — G309 Alzheimer's disease, unspecified: Secondary | ICD-10-CM | POA: Diagnosis not present

## 2016-03-06 DIAGNOSIS — G20 Parkinson's disease: Secondary | ICD-10-CM | POA: Diagnosis not present

## 2016-03-06 DIAGNOSIS — M5030 Other cervical disc degeneration, unspecified cervical region: Secondary | ICD-10-CM | POA: Diagnosis not present

## 2016-03-07 DIAGNOSIS — G20 Parkinson's disease: Secondary | ICD-10-CM | POA: Diagnosis not present

## 2016-03-07 DIAGNOSIS — F0280 Dementia in other diseases classified elsewhere without behavioral disturbance: Secondary | ICD-10-CM | POA: Diagnosis not present

## 2016-03-07 DIAGNOSIS — I10 Essential (primary) hypertension: Secondary | ICD-10-CM | POA: Diagnosis not present

## 2016-03-07 DIAGNOSIS — M6281 Muscle weakness (generalized): Secondary | ICD-10-CM | POA: Diagnosis not present

## 2016-03-07 DIAGNOSIS — M5030 Other cervical disc degeneration, unspecified cervical region: Secondary | ICD-10-CM | POA: Diagnosis not present

## 2016-03-07 DIAGNOSIS — G309 Alzheimer's disease, unspecified: Secondary | ICD-10-CM | POA: Diagnosis not present

## 2016-03-09 DIAGNOSIS — G20 Parkinson's disease: Secondary | ICD-10-CM | POA: Diagnosis not present

## 2016-03-09 DIAGNOSIS — M5030 Other cervical disc degeneration, unspecified cervical region: Secondary | ICD-10-CM | POA: Diagnosis not present

## 2016-03-09 DIAGNOSIS — G309 Alzheimer's disease, unspecified: Secondary | ICD-10-CM | POA: Diagnosis not present

## 2016-03-09 DIAGNOSIS — I10 Essential (primary) hypertension: Secondary | ICD-10-CM | POA: Diagnosis not present

## 2016-03-09 DIAGNOSIS — M6281 Muscle weakness (generalized): Secondary | ICD-10-CM | POA: Diagnosis not present

## 2016-03-09 DIAGNOSIS — F0280 Dementia in other diseases classified elsewhere without behavioral disturbance: Secondary | ICD-10-CM | POA: Diagnosis not present

## 2016-03-12 DIAGNOSIS — F0280 Dementia in other diseases classified elsewhere without behavioral disturbance: Secondary | ICD-10-CM | POA: Diagnosis not present

## 2016-03-12 DIAGNOSIS — G309 Alzheimer's disease, unspecified: Secondary | ICD-10-CM | POA: Diagnosis not present

## 2016-03-12 DIAGNOSIS — I10 Essential (primary) hypertension: Secondary | ICD-10-CM | POA: Diagnosis not present

## 2016-03-12 DIAGNOSIS — M6281 Muscle weakness (generalized): Secondary | ICD-10-CM | POA: Diagnosis not present

## 2016-03-12 DIAGNOSIS — M5030 Other cervical disc degeneration, unspecified cervical region: Secondary | ICD-10-CM | POA: Diagnosis not present

## 2016-03-12 DIAGNOSIS — G20 Parkinson's disease: Secondary | ICD-10-CM | POA: Diagnosis not present

## 2016-03-14 DIAGNOSIS — M6281 Muscle weakness (generalized): Secondary | ICD-10-CM | POA: Diagnosis not present

## 2016-03-14 DIAGNOSIS — M5030 Other cervical disc degeneration, unspecified cervical region: Secondary | ICD-10-CM | POA: Diagnosis not present

## 2016-03-14 DIAGNOSIS — G20 Parkinson's disease: Secondary | ICD-10-CM | POA: Diagnosis not present

## 2016-03-14 DIAGNOSIS — I10 Essential (primary) hypertension: Secondary | ICD-10-CM | POA: Diagnosis not present

## 2016-03-14 DIAGNOSIS — G309 Alzheimer's disease, unspecified: Secondary | ICD-10-CM | POA: Diagnosis not present

## 2016-03-14 DIAGNOSIS — F0280 Dementia in other diseases classified elsewhere without behavioral disturbance: Secondary | ICD-10-CM | POA: Diagnosis not present

## 2016-03-15 DIAGNOSIS — F0280 Dementia in other diseases classified elsewhere without behavioral disturbance: Secondary | ICD-10-CM | POA: Diagnosis not present

## 2016-03-15 DIAGNOSIS — I10 Essential (primary) hypertension: Secondary | ICD-10-CM | POA: Diagnosis not present

## 2016-03-15 DIAGNOSIS — M5030 Other cervical disc degeneration, unspecified cervical region: Secondary | ICD-10-CM | POA: Diagnosis not present

## 2016-03-15 DIAGNOSIS — G309 Alzheimer's disease, unspecified: Secondary | ICD-10-CM | POA: Diagnosis not present

## 2016-03-15 DIAGNOSIS — G20 Parkinson's disease: Secondary | ICD-10-CM | POA: Diagnosis not present

## 2016-03-15 DIAGNOSIS — M6281 Muscle weakness (generalized): Secondary | ICD-10-CM | POA: Diagnosis not present

## 2016-03-19 DIAGNOSIS — I10 Essential (primary) hypertension: Secondary | ICD-10-CM | POA: Diagnosis not present

## 2016-03-19 DIAGNOSIS — G309 Alzheimer's disease, unspecified: Secondary | ICD-10-CM | POA: Diagnosis not present

## 2016-03-19 DIAGNOSIS — F0280 Dementia in other diseases classified elsewhere without behavioral disturbance: Secondary | ICD-10-CM | POA: Diagnosis not present

## 2016-03-19 DIAGNOSIS — G20 Parkinson's disease: Secondary | ICD-10-CM | POA: Diagnosis not present

## 2016-03-19 DIAGNOSIS — M5030 Other cervical disc degeneration, unspecified cervical region: Secondary | ICD-10-CM | POA: Diagnosis not present

## 2016-03-19 DIAGNOSIS — M6281 Muscle weakness (generalized): Secondary | ICD-10-CM | POA: Diagnosis not present

## 2016-03-21 DIAGNOSIS — B351 Tinea unguium: Secondary | ICD-10-CM | POA: Diagnosis not present

## 2016-03-21 DIAGNOSIS — M79673 Pain in unspecified foot: Secondary | ICD-10-CM | POA: Diagnosis not present

## 2016-03-21 DIAGNOSIS — R262 Difficulty in walking, not elsewhere classified: Secondary | ICD-10-CM | POA: Diagnosis not present

## 2016-03-21 DIAGNOSIS — L600 Ingrowing nail: Secondary | ICD-10-CM | POA: Diagnosis not present

## 2016-03-22 DIAGNOSIS — F0280 Dementia in other diseases classified elsewhere without behavioral disturbance: Secondary | ICD-10-CM | POA: Diagnosis not present

## 2016-03-22 DIAGNOSIS — G309 Alzheimer's disease, unspecified: Secondary | ICD-10-CM | POA: Diagnosis not present

## 2016-03-22 DIAGNOSIS — G20 Parkinson's disease: Secondary | ICD-10-CM | POA: Diagnosis not present

## 2016-03-22 DIAGNOSIS — M6281 Muscle weakness (generalized): Secondary | ICD-10-CM | POA: Diagnosis not present

## 2016-03-22 DIAGNOSIS — M5030 Other cervical disc degeneration, unspecified cervical region: Secondary | ICD-10-CM | POA: Diagnosis not present

## 2016-03-22 DIAGNOSIS — I10 Essential (primary) hypertension: Secondary | ICD-10-CM | POA: Diagnosis not present

## 2016-03-27 DIAGNOSIS — Z96653 Presence of artificial knee joint, bilateral: Secondary | ICD-10-CM | POA: Diagnosis not present

## 2016-03-27 DIAGNOSIS — Z471 Aftercare following joint replacement surgery: Secondary | ICD-10-CM | POA: Diagnosis not present

## 2016-03-27 DIAGNOSIS — Z96652 Presence of left artificial knee joint: Secondary | ICD-10-CM | POA: Diagnosis not present

## 2016-03-27 DIAGNOSIS — Z96651 Presence of right artificial knee joint: Secondary | ICD-10-CM | POA: Diagnosis not present

## 2016-04-16 DIAGNOSIS — N390 Urinary tract infection, site not specified: Secondary | ICD-10-CM | POA: Diagnosis not present

## 2016-04-17 ENCOUNTER — Encounter: Payer: Self-pay | Admitting: Family

## 2016-04-24 ENCOUNTER — Encounter: Payer: Self-pay | Admitting: Family

## 2016-04-24 ENCOUNTER — Ambulatory Visit (HOSPITAL_COMMUNITY)
Admission: RE | Admit: 2016-04-24 | Discharge: 2016-04-24 | Disposition: A | Discharge status: Home / Self Care | Payer: Medicare Other | Source: Ambulatory Visit | Attending: Family | Admitting: Family

## 2016-04-24 ENCOUNTER — Ambulatory Visit (INDEPENDENT_AMBULATORY_CARE_PROVIDER_SITE_OTHER): Payer: Medicare Other | Admitting: Family

## 2016-04-24 VITALS — BP 97/56 | HR 73 | Temp 98.3°F | Resp 20 | Ht 65.0 in | Wt 160.0 lb

## 2016-04-24 DIAGNOSIS — Z48812 Encounter for surgical aftercare following surgery on the circulatory system: Secondary | ICD-10-CM | POA: Diagnosis not present

## 2016-04-24 DIAGNOSIS — I6523 Occlusion and stenosis of bilateral carotid arteries: Secondary | ICD-10-CM

## 2016-04-24 DIAGNOSIS — Z9889 Other specified postprocedural states: Secondary | ICD-10-CM | POA: Diagnosis not present

## 2016-04-24 DIAGNOSIS — I6522 Occlusion and stenosis of left carotid artery: Secondary | ICD-10-CM | POA: Diagnosis not present

## 2016-04-24 LAB — VAS US CAROTID
LCCADSYS: 80 cm/s
LCCAPDIAS: 13 cm/s
LCCAPSYS: 116 cm/s
LEFT ECA DIAS: 2 cm/s
LEFT VERTEBRAL DIAS: 9 cm/s
LICADSYS: -114 cm/s
Left CCA dist dias: 12 cm/s
Left ICA dist dias: -22 cm/s
Left ICA prox dias: -18 cm/s
Left ICA prox sys: -66 cm/s
RCCADSYS: -67 cm/s
RCCAPDIAS: 13 cm/s
RIGHT CCA MID DIAS: 12 cm/s
RIGHT ECA DIAS: -3 cm/s
RIGHT VERTEBRAL DIAS: 6 cm/s
Right CCA prox sys: 91 cm/s

## 2016-04-24 NOTE — Progress Notes (Signed)
Chief Complaint: Follow up Extracranial Carotid Artery Stenosis   History of Present Illness  [name] is a 81 y.o. female patient of Dr. Kellie Simmering who is status post right carotid endarterectomy in 2010. She returns today for follow up. Right before her right CEA, she had a right retinal embolus with monocular loss of vision, right arm weakness and trembling, both of the above symptoms remain. She denies right leg weakness, denies expressive aphasia at that time.  She had a TIA about 2012 or 2013 as manifested by confusion and expressive aphasia, denies any further hemiparesis, denies any further loss of vision, denies any further TIA or stroke activity. She denies claudication symptoms with walking. She has had asthma since childhood.  Pt reports New Medical or Surgical History: gets cortisone injections in her neck for c-spine issues and neck pain; son states her surgical risk is too great for surgery on her c-spine. She was in pulmonary therapy. Her balance is off, has low energy. She states that she declined an offer by Dr. Maureen Ralphs to get balance therapy. Son states that she has had evaluation for 2 years to try to determine etiology of her dyspnea, states she has had a cardiac and pulmonary evaluation.    Pt Diabetic: No Pt smoker: non-smoker, but she had secondhand smoke exposure for 60 years from her husband. She worked at [employer], she ran a Management consultant.   Pt meds include: Statin : yes ASA: Yes Other anticoagulants/antiplatelets: no    Past Medical History:  Diagnosis Date  . Acute cystitis   . Anemia   . Anxiety   . Aortic stenosis, mild 11/18/2013  . Asthma   . Back pain   . Bronchitis, acute   . Carotid artery occlusion   . Cerebrovascular disease   . COPD (chronic obstructive pulmonary disease) ([place])   . Depression   . Diverticulosis of colon   . DJD (degenerative joint disease)   . Fall 07/05/2015  . GERD (gastroesophageal reflux  disease)   . History of sudden visual loss   . Hyperlipidemia   . Hypertension   . Hypothyroidism   . Pneumonia 2015   hx of x 2   . Pulmonary hypertension, moderate to severe ([place]) 11/19/2014  . Shortness of breath    with exertion   . 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 Transient ischemic attack   . UTI (lower urinary tract infection)    hx of     Social History Social History  Substance Use Topics  . Smoking status: Never Smoker  . Smokeless tobacco: Never Used  . Alcohol use No    Family History Family History  Problem Relation Age of Onset  . Emphysema Father   . Heart disease Mother   . Hyperlipidemia Mother   . Hypertension Mother   . Coronary artery disease Sister   . Heart disease Sister   . Hypertension Sister   . Varicose Veins Sister   . AAA (abdominal aortic aneurysm) Sister   . Rheumatic fever Brother     x2  . Heart attack Brother     Surgical History Past Surgical History:  Procedure Laterality Date  . CARDIAC CATHETERIZATION  02/18/14  . CARDIOTHORACIC PROCEDURE  01/13/2009   Right   . COLECTOMY    . ENDARTERECTOMY     right carotid endartarectomy - 2010  . FOOT SURGERY     nerve cut between toes   .  LEFT AND RIGHT HEART CATHETERIZATION WITH CORONARY ANGIOGRAM N/A 02/18/2014   Procedure: LEFT AND RIGHT HEART CATHETERIZATION WITH CORONARY ANGIOGRAM;  Surgeon: Larey Dresser, MD;  Location: [facility] CATH LAB;  Service: Cardiovascular;  Laterality: N/A;  . REPLACEMENT TOTAL KNEE     right and left knee  . TEE WITHOUT CARDIOVERSION N/A 02/08/2014   Procedure: TRANSESOPHAGEAL ECHOCARDIOGRAM (TEE);  Surgeon: Larey Dresser, MD;  Location: [place];  Service: Cardiovascular;  Laterality: N/A;  . TOTAL KNEE ARTHROPLASTY Left 11/17/2012   Procedure: TOTAL KNEE ARTHROPLASTY;  Surgeon: Gearlean Alf, MD;  Location: [facility];  Service: Orthopedics;  Laterality: Left;  . TUBAL LIGATION    . VAGINAL HYSTERECTOMY      Allergies  Allergen  Reactions  . Relafen [Nabumetone] Diarrhea and Other (See Comments)    GI / Urinary Bleeding  . Lipitor [Atorvastatin] Other (See Comments)    Causes memory loss  . Other Other (See Comments)    "orvail" unknown:  Causes Bloody stool  . Penicillins Hives, Itching, Swelling and Rash    Has patient had a PCN reaction causing immediate rash, facial/tongue/throat swelling, SOB or lightheadedness with hypotension: no Has patient had a PCN reaction causing severe rash involving mucus membranes or skin necrosis: unknown Has patient had a PCN reaction that required hospitalization no Has patient had a PCN reaction occurring within the last 10 years: no If all of the above answers are "NO", then may proceed with Cephalosporin use.     Current Outpatient Prescriptions  Medication Sig Dispense Refill  . ADVAIR HFA 230-21 MCG/ACT inhaler Inhale 2 puffs into the lungs 2 (two) times daily. 1 Inhaler 3  . albuterol (PROVENTIL HFA;VENTOLIN HFA) 108 (90 BASE) MCG/ACT inhaler Inhale 2 puffs into the lungs every 6 (six) hours as needed for wheezing or shortness of breath. 3 Inhaler 3  . amLODipine (NORVASC) 5 MG tablet Take 1 tablet (5 mg total) by mouth daily. Must keep appt for future refills 30 tablet 0  . aspirin EC 81 MG tablet Take 1 tablet (81 mg total) by mouth daily. 90 tablet 3  . baclofen (LIORESAL) 10 MG tablet Take 5 mg by mouth every 8 (eight) hours as needed. For spasm  0  . benzonatate (TESSALON) 100 MG capsule Take by mouth 3 (three) times daily as needed for cough.    . bisacodyl (DULCOLAX) 5 MG EC tablet Take 1 tablet (5 mg total) by mouth daily as needed for moderate constipation. 30 tablet 0  . carbidopa-levodopa (SINEMET IR) 25-100 MG tablet 1 1/2 tab by mouth three times daily 7a, 11a, 4 p for parkinson's disease    . Cranberry-Vitamin C-Probiotic (AZO CRANBERRY) 250-30 MG TABS Take 1 capsule by mouth daily.    . diazepam (VALIUM) 2 MG tablet 1/2 - 1 tab by mouth twice per day as  needed 20 tablet 0  . escitalopram (LEXAPRO) 10 MG tablet Take 1 tablet (10 mg total) by mouth daily. (Patient taking differently: Take 20 mg by mouth daily. ) 90 tablet 3  . ferrous sulfate 325 (65 FE) MG tablet Take 325 mg by mouth daily with breakfast.    . fish oil-omega-3 fatty acids 1000 MG capsule Take 1 g by mouth daily.     . fluticasone (FLONASE) 50 MCG/ACT nasal spray Place into both nostrils daily.    Marland Kitchen levothyroxine (SYNTHROID) 88 MCG tablet Take 88 mcg by mouth daily before breakfast.    . meloxicam (MOBIC) 15 MG tablet Take 15  mg by mouth daily.    Marland Kitchen omeprazole (PRILOSEC) 20 MG capsule Take 1 capsule (20 mg total) by mouth daily. 90 capsule 3  . Oyster Shell 500 MG TABS Take by mouth.    . polyethylene glycol (MIRALAX / GLYCOLAX) packet Take 17 g by mouth daily as needed for mild constipation. 14 each 0  . pravastatin (PRAVACHOL) 20 MG tablet Take 1 tablet (20 mg total) by mouth daily at 6 PM. 30 tablet 1  . sucralfate (CARAFATE) 1 g tablet Take 1 g by mouth 4 (four) times daily -  with meals and at bedtime.     No current facility-administered medications for this visit.     Review of Systems : See HPI for pertinent positives and negatives.  Physical Examination  Vitals:   04/24/16 1418 04/24/16 1423  BP: (!) 113/59 (!) 97/56  Pulse: 73   Resp: 20   Temp: 98.3 F (36.8 C)   TempSrc: Oral   SpO2: 98%   Weight: 160 lb (72.6 kg)   Height: 5\' 5"  (1.651 m)    Body mass index is 26.63 kg/m.  General: WDWN female in NAD GAIT: seated in wheelchair Eyes: PERRLA Pulmonary: Respirations are mildly labored at rest, decreased air movement in posterior fields, + rales in bases bilaterally, no rhonchi or wheezing.  Cardiac: regular rhythm and rate, no detected murmur.  VASCULAR EXAM Carotid Bruits Right Left   Negative Negative   Aorta is not palpable. Radial pulses are 2+ palpable and equal.       LE Pulses Right Left   POPLITEAL not palpable  not palpable   POSTERIOR TIBIAL  2+ palpable  2+ palpable    DORSALIS PEDIS  ANTERIOR TIBIAL not palpable  not palpable     Gastrointestinal: soft, nontender, BS WNL, no r/g,no masses palpated.  Musculoskeletal: Generalized mild muscle atrophy/wasting/deconditioning. M/S 4/5 throughout Extremities without ischemic changes. Hands and face are pale.  Neurologic: A&O X 3; Appropriate Affect,  Speech is normal  CN 2-12 intact, Pain and light touch intact in extremities, Motor exam as listed above. Mild tremor in out-stretched right hand.    Assessment: [name] is a 81 y.o. female who is status post right carotid endarterectomy in 2010. Preceding her right CEA, she had a right retinal embolus with monocular loss of vision, right arm weakness and trembling, both of the above symptoms remain. She denies right leg weakness, denies expressive aphasia at that time.  She had a TIA about 2012 or 2013 as manifested by confusion and expressive aphasia, denies any further hemiparesis, denies any further loss of vision, denies any further TIA or stroke activity.  DATA Today's carotid duplex suggests a patent right carotid endarterectomy site with no restenosis, and minimal left ICA stenosis. No significant change in comparison to the last exams on 04/19/2014 and 04/19/15.   Plan: Follow-up in 1 year with Carotid Duplex scan.   I discussed in depth with the patient the nature of atherosclerosis, and emphasized the importance of maximal medical management including strict control of blood pressure, blood glucose, and lipid levels, obtaining regular exercise, and continued cessation of smoking.  The patient is aware that without maximal medical management the underlying atherosclerotic disease process will  progress, limiting the benefit of any interventions. The patient was given information about stroke prevention and what symptoms should prompt the patient to seek immediate medical care. Thank you for allowing [place] to participate in this patient's care.  Clemon Chambers, RN, MSN, FNP-C [facility] Office: [phone]  Clinic Physician: Early  04/24/16 2:32 PM

## 2016-04-24 NOTE — Patient Instructions (Signed)
Stroke Prevention Some medical conditions and behaviors are associated with an increased chance of having a stroke. You may prevent a stroke by making healthy choices and managing medical conditions. HOW CAN I REDUCE MY RISK OF HAVING A STROKE?   Stay physically active. Get at least 30 minutes of activity on most or all days.  Do not smoke. It may also be helpful to avoid exposure to secondhand smoke.  Limit alcohol use. Moderate alcohol use is considered to be:  No more than 2 drinks per day for men.  No more than 1 drink per day for nonpregnant women.  Eat healthy foods. This involves:  Eating 5 or more servings of fruits and vegetables a day.  Making dietary changes that address high blood pressure (hypertension), high cholesterol, diabetes, or obesity.  Manage your cholesterol levels.  Making food choices that are high in fiber and low in saturated fat, trans fat, and cholesterol may control cholesterol levels.  Take any prescribed medicines to control cholesterol as directed by your health care provider.  Manage your diabetes.  Controlling your carbohydrate and sugar intake is recommended to manage diabetes.  Take any prescribed medicines to control diabetes as directed by your health care provider.  Control your hypertension.  Making food choices that are low in salt (sodium), saturated fat, trans fat, and cholesterol is recommended to manage hypertension.  Ask your health care provider if you need treatment to lower your blood pressure. Take any prescribed medicines to control hypertension as directed by your health care provider.  If you are 18-39 years of age, have your blood pressure checked every 3-5 years. If you are 40 years of age or older, have your blood pressure checked every year.  Maintain a healthy weight.  Reducing calorie intake and making food choices that are low in sodium, saturated fat, trans fat, and cholesterol are recommended to manage  weight.  Stop drug abuse.  Avoid taking birth control pills.  Talk to your health care provider about the risks of taking birth control pills if you are over 35 years old, smoke, get migraines, or have ever had a blood clot.  Get evaluated for sleep disorders (sleep apnea).  Talk to your health care provider about getting a sleep evaluation if you snore a lot or have excessive sleepiness.  Take medicines only as directed by your health care provider.  For some people, aspirin or blood thinners (anticoagulants) are helpful in reducing the risk of forming abnormal blood clots that can lead to stroke. If you have the irregular heart rhythm of atrial fibrillation, you should be on a blood thinner unless there is a good reason you cannot take them.  Understand all your medicine instructions.  Make sure that other conditions (such as anemia or atherosclerosis) are addressed. SEEK IMMEDIATE MEDICAL CARE IF:   You have sudden weakness or numbness of the face, arm, or leg, especially on one side of the body.  Your face or eyelid droops to one side.  You have sudden confusion.  You have trouble speaking (aphasia) or understanding.  You have sudden trouble seeing in one or both eyes.  You have sudden trouble walking.  You have dizziness.  You have a loss of balance or coordination.  You have a sudden, severe headache with no known cause.  You have new chest pain or an irregular heartbeat. Any of these symptoms may represent a serious problem that is an emergency. Do not wait to see if the symptoms will   go away. Get medical help at once. Call your local emergency services (911 in [place] Do not drive yourself to the hospital.   This information is not intended to replace advice given to you by your health care provider. Make sure you discuss any questions you have with your health care provider.   Document Released: 08/23/2004 Document Revised: 08/06/2014 Document Reviewed:  01/16/2013 Elsevier Interactive Patient Education 2016 [employer]  

## 2016-05-01 DIAGNOSIS — F329 Major depressive disorder, single episode, unspecified: Secondary | ICD-10-CM | POA: Diagnosis not present

## 2016-05-01 DIAGNOSIS — E039 Hypothyroidism, unspecified: Secondary | ICD-10-CM | POA: Diagnosis not present

## 2016-05-01 DIAGNOSIS — D649 Anemia, unspecified: Secondary | ICD-10-CM | POA: Diagnosis not present

## 2016-05-01 DIAGNOSIS — F0280 Dementia in other diseases classified elsewhere without behavioral disturbance: Secondary | ICD-10-CM | POA: Diagnosis not present

## 2016-05-07 DIAGNOSIS — R05 Cough: Secondary | ICD-10-CM | POA: Diagnosis not present

## 2016-05-11 DIAGNOSIS — M79662 Pain in left lower leg: Secondary | ICD-10-CM | POA: Diagnosis not present

## 2016-05-11 DIAGNOSIS — R0602 Shortness of breath: Secondary | ICD-10-CM | POA: Diagnosis not present

## 2016-05-11 DIAGNOSIS — G9341 Metabolic encephalopathy: Secondary | ICD-10-CM | POA: Diagnosis not present

## 2016-05-11 DIAGNOSIS — F0391 Unspecified dementia with behavioral disturbance: Secondary | ICD-10-CM | POA: Diagnosis not present

## 2016-05-11 DIAGNOSIS — R2681 Unsteadiness on feet: Secondary | ICD-10-CM | POA: Diagnosis not present

## 2016-05-11 DIAGNOSIS — M542 Cervicalgia: Secondary | ICD-10-CM | POA: Diagnosis not present

## 2016-05-16 DIAGNOSIS — M542 Cervicalgia: Secondary | ICD-10-CM | POA: Diagnosis not present

## 2016-05-16 DIAGNOSIS — F0391 Unspecified dementia with behavioral disturbance: Secondary | ICD-10-CM | POA: Diagnosis not present

## 2016-05-16 DIAGNOSIS — M79662 Pain in left lower leg: Secondary | ICD-10-CM | POA: Diagnosis not present

## 2016-05-16 DIAGNOSIS — G9341 Metabolic encephalopathy: Secondary | ICD-10-CM | POA: Diagnosis not present

## 2016-05-16 DIAGNOSIS — R2681 Unsteadiness on feet: Secondary | ICD-10-CM | POA: Diagnosis not present

## 2016-05-16 DIAGNOSIS — R0602 Shortness of breath: Secondary | ICD-10-CM | POA: Diagnosis not present

## 2016-05-17 DIAGNOSIS — R2681 Unsteadiness on feet: Secondary | ICD-10-CM | POA: Diagnosis not present

## 2016-05-17 DIAGNOSIS — R0602 Shortness of breath: Secondary | ICD-10-CM | POA: Diagnosis not present

## 2016-05-17 DIAGNOSIS — M79662 Pain in left lower leg: Secondary | ICD-10-CM | POA: Diagnosis not present

## 2016-05-17 DIAGNOSIS — M542 Cervicalgia: Secondary | ICD-10-CM | POA: Diagnosis not present

## 2016-05-17 DIAGNOSIS — G9341 Metabolic encephalopathy: Secondary | ICD-10-CM | POA: Diagnosis not present

## 2016-05-17 DIAGNOSIS — F0391 Unspecified dementia with behavioral disturbance: Secondary | ICD-10-CM | POA: Diagnosis not present

## 2016-05-21 DIAGNOSIS — M542 Cervicalgia: Secondary | ICD-10-CM | POA: Diagnosis not present

## 2016-05-21 DIAGNOSIS — M79662 Pain in left lower leg: Secondary | ICD-10-CM | POA: Diagnosis not present

## 2016-05-21 DIAGNOSIS — G9341 Metabolic encephalopathy: Secondary | ICD-10-CM | POA: Diagnosis not present

## 2016-05-21 DIAGNOSIS — R2681 Unsteadiness on feet: Secondary | ICD-10-CM | POA: Diagnosis not present

## 2016-05-21 DIAGNOSIS — F0391 Unspecified dementia with behavioral disturbance: Secondary | ICD-10-CM | POA: Diagnosis not present

## 2016-05-21 DIAGNOSIS — R0602 Shortness of breath: Secondary | ICD-10-CM | POA: Diagnosis not present

## 2016-05-21 IMAGING — RF DG SWALLOWING FUNCTION
2 series · 2 of 2 positions shown · non-contrast
Comparison: None.

CLINICAL DATA: Dysphasia.

EXAM:
MODIFIED BARIUM SWALLOW
TECHNIQUE: Different consistencies of barium were administered orally to the
patient by the Speech Pathologist. Imaging of the pharynx was
performed in the lateral projection.
FLUOROSCOPY TIME:  Radiation Exposure Index (as provided by the
fluoroscopic device): 4.1 mGy

[Series 1: run · 1 of 1 slices shown (1 of 2)]
[im 1/1]
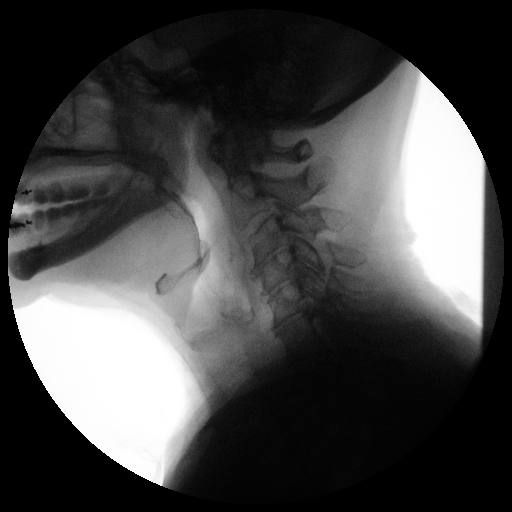

[Series 1: run · 1 of 1 slices shown (2 of 2)]
[im 1/1]
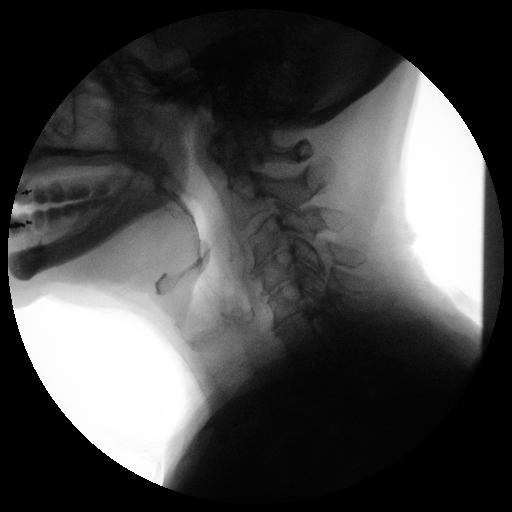

[2 of 2 positions shown; findings below may reference images not displayed]

FINDINGS: Thin liquid- within normal limits

Nectar thick liquid- within normal limits

Lourivaldo?Tiger within normal limits

Lourivaldo?Jaylon with cracker- within normal limits
IMPRESSION: Normal exam.

Please refer to the Speech Pathologists report for complete details
and recommendations.

## 2016-05-23 NOTE — Progress Notes (Signed)
[name] was seen today in the movement disorders clinic for neurologic consultation at the request of [name]., MD.   The patient is accompanied by her daughter and granddaughter who supplements the history.  The patient is seen today in neurologic consultation for possible Parkinson's disease.  I have reviewed an extensive number of records made available to me.  The patient was hospitalized on December 8 after a fall 2 days prior to admission.  The fall occurred after the patient got up at night while attempting to use the bathroom.  She did apparently taken a Valium earlier that night.  This was just one of many falls that she had sustained.  Her family had questioned whether or not she had had Parkinson's disease and a neurologic consultation was obtained while she was in the hospital.  The patient saw Dr. Leonel Ramsay and he felt that her symptoms were consistent with possible Parkinson's disease.  She was started on levodopa, 1 po tid and told to follow-up here.  Pt and family indicate that it has helped walking and tremor.  She is currently in skilled rehab for 30 days but is only in for short term and has only been there for 1 day.    The patient has previously seen [facility] neurology and last saw [facility] neurology on 03/15/2015.  She has seen Dr. Leonie [name] as well as several of his nurse practitioners.  I reviewed those records in detail.  She had a TIA on 01/03/2013 but there was no tremor when seen by [facility] neurology in July, 2014.  On her follow-up in January, 2015 notes indicate that "action tremor" had developed in the right hand and a resting tremor had developed in the right foot.  In February, 2016 notes indicate that the patient now had decreased arm swing and a mild resting tremor of the right hand and Mysoline was offered to the patient but she did not want that.  On 03/15/2015 the patient reported more falls and it was recommended that they just monitor the tremor.  No new  medications or therapy were given.  She reports fam hx of PD in maternal aunt.   09/19/15 update:  The patient presents today for follow-up, accompanied by her daughter who supplements the history.  I have reviewed prior records made available to me.  Last visit, her levodopa was increased to 1-1/2 tablets 3 times per day and it was recommended that she take these at 7 AM/11 AM/4 PM.  She did not do this and is still taking it tid and spreading out the last to take at bedtime.  States that she remembers when she was in the SNF she knew she was taking an extra 1/2 tablet of something but didn't know what.  She denies any falls since our last visit.  No hallucinations.  No lightheadedness or near syncope.  She did have a modified barium swallow on 08/05/2015.  This was normal.  It was noted that her respiratory status may allow for episodic aspiration due to discoordination of swallow/apneic period.  I did note from SNF records that the patient was on valium 2 mg prn for tremor.   Is still taking it at home but isn't sure how often but thinks one time per day. 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  She is accompanied by her daughter and granddaughter  who supplement the history.  Last visit, her levodopa was increased to 1-1/2 tablets 3 times per day and it was recommended that she take these at 7 AM/11 AM/4 PM. She actually takes these at 7:30/noon/and 6pm (1 hour after supper).   I talked extensively to her last visit about the fact that I did not like her living situation, which was living with her husband who had Alzheimer's disease, given the fact that the patient's own memory was impaired.  I have reviewed  records made available to me since last visit.  She was hospitalized from March 20 to March 23 with a urinary tract infection and discharged to a subacute nursing facility.  Reports that she  is in still in assisted living and "that is my home now."  They help her at night with getting up and they cook the food.  She is able to get herself dressed.  She gets assistance with bathing.  She gets some PT a few times a week and otherwise is in the [facility] much of the time.  She had a bedside swallow evaluation on March 21 recommended mechanical soft diet with thin liquids.  No falls.  No hallucinations.    05/24/16 update:  The patient presents today for follow-up, accompanied by her daughter who supplements the history.  She is on carbidopa/levodopa 25/100,  1-1/2 tablets 3 times a day.  She states that her biggest issue is tremor of the right foot.  It is better if she is given her medication on time, which she states is a challenge because she is given that that shift change in the morning.  She asks me to write her a prescription for marijuana.  She denies falls.  She denies lightheadedness or near syncope.  She continues to live at the assisted living facility.  She did follow up with vascular surgery and I reviewed those records.  She had a carotid ultrasound that did not demonstrate any significant stenosis.  This was done on 04/24/2016.   ALLERGIES:   Allergies  Allergen Reactions  . Relafen [Nabumetone] Diarrhea and Other (See Comments)    GI / Urinary Bleeding  . Lipitor [Atorvastatin] Other (See Comments)    Causes memory loss  . Other Other (See Comments)    "orvail" unknown:  Causes Bloody stool  . Penicillins Hives, Itching, Swelling and Rash    Has patient had a PCN reaction causing immediate rash, facial/tongue/throat swelling, SOB or lightheadedness with hypotension: no Has patient had a PCN reaction causing severe rash involving mucus membranes or skin necrosis: unknown Has patient had a PCN reaction that required hospitalization no Has patient had a PCN reaction occurring within the last 10 years: no If all of the above answers are "NO", then may proceed with Cephalosporin  use.     CURRENT MEDICATIONS:  Outpatient Encounter Prescriptions as of 05/24/2016  Medication Sig  . ADVAIR HFA 230-21 MCG/ACT inhaler Inhale 2 puffs into the lungs 2 (two) times daily.  Marland Kitchen albuterol (PROVENTIL HFA;VENTOLIN HFA) 108 (90 BASE) MCG/ACT inhaler Inhale 2 puffs into the lungs every 6 (six) hours as needed for wheezing or shortness of breath.  Marland Kitchen amLODipine (NORVASC) 5 MG tablet Take 1 tablet (5 mg total) by mouth daily. Must keep appt for future refills  . aspirin EC 81 MG tablet Take 1 tablet (81 mg total) by mouth daily.  . baclofen (LIORESAL) 10 MG tablet Take 5 mg by mouth every 8 (eight) hours as needed. For spasm  .  benzonatate (TESSALON) 100 MG capsule Take by mouth 3 (three) times daily as needed for cough.  . bisacodyl (DULCOLAX) 5 MG EC tablet Take 1 tablet (5 mg total) by mouth daily as needed for moderate constipation.  . carbidopa-levodopa (SINEMET IR) 25-100 MG tablet 1 1/2 tab by mouth three times daily 7a, 11a, 4 p for parkinson's disease  . Cranberry-Vitamin C-Probiotic (AZO CRANBERRY) 250-30 MG TABS Take 1 capsule by mouth daily.  . diazepam (VALIUM) 2 MG tablet 1/2 - 1 tab by mouth twice per day as needed  . escitalopram (LEXAPRO) 10 MG tablet Take 1 tablet (10 mg total) by mouth daily. (Patient taking differently: Take 20 mg by mouth daily. )  . ferrous sulfate 325 (65 FE) MG tablet Take 325 mg by mouth daily with breakfast.  . fish oil-omega-3 fatty acids 1000 MG capsule Take 1 g by mouth daily.   . fluticasone (FLONASE) 50 MCG/ACT nasal spray Place into both nostrils daily.  Marland Kitchen guaifenesin (ROBAFEN) 100 MG/5ML syrup Take 200 mg by mouth 3 (three) times daily as needed for cough.  . levothyroxine (SYNTHROID) 88 MCG tablet Take 88 mcg by mouth daily before breakfast.  . meloxicam (MOBIC) 15 MG tablet Take 15 mg by mouth daily.  Marland Kitchen omeprazole (PRILOSEC) 20 MG capsule Take 1 capsule (20 mg total) by mouth daily.  Heide Spark Shell 500 MG TABS Take by mouth.  .  polyethylene glycol (MIRALAX / GLYCOLAX) packet Take 17 g by mouth daily as needed for mild constipation.  . pravastatin (PRAVACHOL) 20 MG tablet Take 1 tablet (20 mg total) by mouth daily at 6 PM.  . sucralfate (CARAFATE) 1 g tablet Take 1 g by mouth 4 (four) times daily -  with meals and at bedtime.  . traMADol (ULTRAM) 50 MG tablet Take 25 mg by mouth every 8 (eight) hours.   No facility-administered encounter medications on file as of 05/24/2016.     PAST MEDICAL HISTORY:   Past Medical History:  Diagnosis Date  . Acute cystitis   . Anemia   . Anxiety   . Aortic stenosis, mild 11/18/2013  . Asthma   . Back pain   . Bronchitis, acute   . Carotid artery occlusion   . Cerebrovascular disease   . COPD (chronic obstructive pulmonary disease) ([place])   . Depression   . Diverticulosis of colon   . DJD (degenerative joint disease)   . Fall 07/05/2015  . GERD (gastroesophageal reflux disease)   . History of sudden visual loss   . Hyperlipidemia   . Hypertension   . Hypothyroidism   . Pneumonia 2015   hx of x 2   . Pulmonary hypertension, moderate to severe 11/19/2014  . Shortness of breath    with exertion   . 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 Transient ischemic attack   . UTI (lower urinary tract infection)    hx of     PAST SURGICAL HISTORY:   Past Surgical History:  Procedure Laterality Date  . CARDIAC CATHETERIZATION  02/18/14  . CARDIOTHORACIC PROCEDURE  01/13/2009   Right   . COLECTOMY    . ENDARTERECTOMY     right carotid endartarectomy - 2010  . FOOT SURGERY     nerve cut between toes   . LEFT AND RIGHT HEART CATHETERIZATION WITH CORONARY ANGIOGRAM N/A 02/18/2014   Procedure: LEFT AND RIGHT HEART CATHETERIZATION WITH CORONARY ANGIOGRAM;  Surgeon: Larey Dresser, MD;  Location:  [facility] CATH LAB;  Service: Cardiovascular;  Laterality: N/A;  . REPLACEMENT TOTAL KNEE     right and left knee  . TEE WITHOUT CARDIOVERSION N/A 02/08/2014   Procedure:  TRANSESOPHAGEAL ECHOCARDIOGRAM (TEE);  Surgeon: Larey Dresser, MD;  Location: [facility];  Service: Cardiovascular;  Laterality: N/A;  . TOTAL KNEE ARTHROPLASTY Left 11/17/2012   Procedure: TOTAL KNEE ARTHROPLASTY;  Surgeon: Gearlean Alf, MD;  Location: [facility];  Service: Orthopedics;  Laterality: Left;  . TUBAL LIGATION    . VAGINAL HYSTERECTOMY      SOCIAL HISTORY:   Social History   Social History  . Marital status: Married    Spouse name: Journalist, newspaper  . Number of children: 3  . Years of education: N/A   Occupational History  . Not on file.   Social History Main Topics  . Smoking status: Never Smoker  . Smokeless tobacco: Never Used  . Alcohol use No  . Drug use: No  . Sexual activity: Not on file   Other Topics Concern  . Not on file   Social History Narrative  . No narrative on file    FAMILY HISTORY:   Family Status  Relation Status  . Father Deceased   Emphysema  . Mother Deceased   CHF  . Sister Alive   heart disease  . Brother Alive   COPD  . Sister Alive   COPD  . Sister   . Brother     ROS:  A complete 10 system review of systems was obtained and was unremarkable apart from what is mentioned above.  PHYSICAL EXAMINATION:    VITALS:   Vitals:   05/24/16 1529  BP: 110/80  Pulse: 76  SpO2: 91%  Weight: 164 lb 9 oz (74.6 kg)  Height: 5\' 5"  (1.651 m)    GEN:  The patient appears stated age and is in NAD.  There is an unpleasant odor in the room. HEENT:  Normocephalic, atraumatic.  The mucous membranes are moist. The superficial temporal arteries are without ropiness or tenderness. CV:  RRR Lungs:  CTAB.  She is visibly short of breath.  This gets worse with exertion. Neck/HEME:  There are no carotid bruits bilaterally.  Neurological examination:  Orientation:  Montreal Cognitive Assessment  02/16/2016  Visuospatial/ Executive (0/5) 3  Naming (0/3) 3  Attention: Read list of digits (0/2) 2  Attention: Read list of letters (0/1) 1    Attention: Serial 7 subtraction starting at 100 (0/3) 1  Language: Repeat phrase (0/2) 2  Language : Fluency (0/1) 1  Abstraction (0/2) 2  Delayed Recall (0/5) 2  Orientation (0/6) 6  Total 23  Adjusted Score (based on education) 24    Cranial nerves: There is good facial symmetry.  There is significant facial hypomimia.  The speech is fluent and clear.  She is hypophonic.  Soft palate rises symmetrically and there is no tongue deviation. Hearing is intact to conversational tone. Sensation: Sensation is intact to light touch throughout. Motor: Strength is 5/5 in the bilateral upper and lower extremities.   Shoulder shrug is equal and symmetric.  There is no pronator drift.   Movement examination: Tone: There is no rigidity today.  Tone in the left upper extremity was normal.   Abnormal movements: There is a mild right upper extremity resting tremor and RLE resting tremor.  She has mild dyskinesia in the legs. Coordination:  There is no significant decremation today. Gait and Station: The patient does not ambulate today.  Reports that she really is not walking much at all.  Her daughter states that she has almost always in the bed.  ASSESSMENT/PLAN:  1.  Idiopathic Parkinson's disease.  While this is just diagnosed in 06/2015, she has had documented sx's since at least 07/2013.    -She is frustrated with tremor.  I did increase her carbidopa/levodopa 25/100 from 1-1/2 tablets 3 times per day to 2 tablets 3 times per day.  Warned her that dyskinesia could slightly increase.  She is not worried about that.  -Patient asked me to write her prescription for marijuana.  I explained to her that this is not thecal.  In addition, this was recommended in Parkinson's patients due to increased cognitive change and increased risk of falls. 2.  Dysphagia  -She did have a modified barium swallow on 08/05/2015.  This was normal.  It was noted that her respiratory status may allow for episodic aspiration  due to discoordination of swallow/apneic period.  Has to drink out of straw or will choke and is complying with that 3.  Shortness of breath  -This is going to significantly limit her ability to participate with therapy.  Her daughter states that therapy has just been started back up. 4  Anxiety  -back on valium.  Daughter asked me about this.  It is certainly not my favorite anxiolytic and the patient is getting it twice a day.  However, it probably is not worth the flight in this patient.  She is only on 2 mg twice a day.  I talked to the patient and her daughter about the fact that it certainly can affect cognition. 5.  Follow up is anticipated in the next 4 months, sooner should new neurologic issues arise.   Much greater than 50% of this visit was spent in counseling with the patient and the family.  Total face to face time:  25 min

## 2016-05-24 ENCOUNTER — Encounter: Payer: Self-pay | Admitting: Neurology

## 2016-05-24 ENCOUNTER — Ambulatory Visit (INDEPENDENT_AMBULATORY_CARE_PROVIDER_SITE_OTHER): Payer: Medicare Other | Admitting: Neurology

## 2016-05-24 VITALS — BP 110/80 | HR 76 | Ht 65.0 in | Wt 164.6 lb

## 2016-05-24 DIAGNOSIS — R2681 Unsteadiness on feet: Secondary | ICD-10-CM | POA: Diagnosis not present

## 2016-05-24 DIAGNOSIS — F0391 Unspecified dementia with behavioral disturbance: Secondary | ICD-10-CM | POA: Diagnosis not present

## 2016-05-24 DIAGNOSIS — G20 Parkinson's disease: Secondary | ICD-10-CM | POA: Diagnosis not present

## 2016-05-24 DIAGNOSIS — M79662 Pain in left lower leg: Secondary | ICD-10-CM | POA: Diagnosis not present

## 2016-05-24 DIAGNOSIS — I6523 Occlusion and stenosis of bilateral carotid arteries: Secondary | ICD-10-CM | POA: Diagnosis not present

## 2016-05-24 DIAGNOSIS — F411 Generalized anxiety disorder: Secondary | ICD-10-CM | POA: Diagnosis not present

## 2016-05-24 DIAGNOSIS — M542 Cervicalgia: Secondary | ICD-10-CM | POA: Diagnosis not present

## 2016-05-24 DIAGNOSIS — R0602 Shortness of breath: Secondary | ICD-10-CM | POA: Diagnosis not present

## 2016-05-24 DIAGNOSIS — R413 Other amnesia: Secondary | ICD-10-CM | POA: Diagnosis not present

## 2016-05-24 DIAGNOSIS — G9341 Metabolic encephalopathy: Secondary | ICD-10-CM | POA: Diagnosis not present

## 2016-05-25 DIAGNOSIS — F0391 Unspecified dementia with behavioral disturbance: Secondary | ICD-10-CM | POA: Diagnosis not present

## 2016-05-25 DIAGNOSIS — R2681 Unsteadiness on feet: Secondary | ICD-10-CM | POA: Diagnosis not present

## 2016-05-25 DIAGNOSIS — G9341 Metabolic encephalopathy: Secondary | ICD-10-CM | POA: Diagnosis not present

## 2016-05-25 DIAGNOSIS — R0602 Shortness of breath: Secondary | ICD-10-CM | POA: Diagnosis not present

## 2016-05-25 DIAGNOSIS — M79662 Pain in left lower leg: Secondary | ICD-10-CM | POA: Diagnosis not present

## 2016-05-25 DIAGNOSIS — M542 Cervicalgia: Secondary | ICD-10-CM | POA: Diagnosis not present

## 2016-05-28 DIAGNOSIS — F419 Anxiety disorder, unspecified: Secondary | ICD-10-CM | POA: Diagnosis not present

## 2016-05-30 DIAGNOSIS — F0391 Unspecified dementia with behavioral disturbance: Secondary | ICD-10-CM | POA: Diagnosis not present

## 2016-05-30 DIAGNOSIS — R0602 Shortness of breath: Secondary | ICD-10-CM | POA: Diagnosis not present

## 2016-05-30 DIAGNOSIS — M542 Cervicalgia: Secondary | ICD-10-CM | POA: Diagnosis not present

## 2016-05-30 DIAGNOSIS — R2681 Unsteadiness on feet: Secondary | ICD-10-CM | POA: Diagnosis not present

## 2016-05-30 DIAGNOSIS — M79662 Pain in left lower leg: Secondary | ICD-10-CM | POA: Diagnosis not present

## 2016-05-30 DIAGNOSIS — G9341 Metabolic encephalopathy: Secondary | ICD-10-CM | POA: Diagnosis not present

## 2016-06-04 DIAGNOSIS — M542 Cervicalgia: Secondary | ICD-10-CM | POA: Diagnosis not present

## 2016-06-20 DIAGNOSIS — M6281 Muscle weakness (generalized): Secondary | ICD-10-CM | POA: Diagnosis not present

## 2016-06-20 DIAGNOSIS — G309 Alzheimer's disease, unspecified: Secondary | ICD-10-CM | POA: Diagnosis not present

## 2016-06-20 DIAGNOSIS — I10 Essential (primary) hypertension: Secondary | ICD-10-CM | POA: Diagnosis not present

## 2016-06-20 DIAGNOSIS — D649 Anemia, unspecified: Secondary | ICD-10-CM | POA: Diagnosis not present

## 2016-06-20 DIAGNOSIS — R262 Difficulty in walking, not elsewhere classified: Secondary | ICD-10-CM | POA: Diagnosis not present

## 2016-06-20 DIAGNOSIS — F0280 Dementia in other diseases classified elsewhere without behavioral disturbance: Secondary | ICD-10-CM | POA: Diagnosis not present

## 2016-06-20 DIAGNOSIS — R1311 Dysphagia, oral phase: Secondary | ICD-10-CM | POA: Diagnosis not present

## 2016-06-20 DIAGNOSIS — E039 Hypothyroidism, unspecified: Secondary | ICD-10-CM | POA: Diagnosis not present

## 2016-06-22 DIAGNOSIS — F0390 Unspecified dementia without behavioral disturbance: Secondary | ICD-10-CM | POA: Diagnosis not present

## 2016-06-22 DIAGNOSIS — N390 Urinary tract infection, site not specified: Secondary | ICD-10-CM | POA: Diagnosis not present

## 2016-06-25 DIAGNOSIS — N390 Urinary tract infection, site not specified: Secondary | ICD-10-CM | POA: Diagnosis not present

## 2016-06-27 NOTE — Addendum Note (Signed)
Addended by: Lianne Cure A on: 06/27/2016 11:35 AM   Modules accepted: Orders

## 2016-06-28 DIAGNOSIS — M25511 Pain in right shoulder: Secondary | ICD-10-CM | POA: Diagnosis not present

## 2016-07-02 DIAGNOSIS — M25511 Pain in right shoulder: Secondary | ICD-10-CM | POA: Diagnosis not present

## 2016-07-09 ENCOUNTER — Encounter (HOSPITAL_COMMUNITY): Payer: Self-pay | Admitting: Emergency Medicine

## 2016-07-09 ENCOUNTER — Emergency Department (HOSPITAL_COMMUNITY): Payer: Medicare Other

## 2016-07-09 ENCOUNTER — Emergency Department (HOSPITAL_COMMUNITY)
Admission: EM | Admit: 2016-07-09 | Discharge: 2016-07-10 | Disposition: A | Discharge status: Home / Self Care | Payer: Medicare Other | Attending: Emergency Medicine | Admitting: Emergency Medicine

## 2016-07-09 DIAGNOSIS — Z79899 Other long term (current) drug therapy: Secondary | ICD-10-CM | POA: Diagnosis not present

## 2016-07-09 DIAGNOSIS — Z96653 Presence of artificial knee joint, bilateral: Secondary | ICD-10-CM | POA: Insufficient documentation

## 2016-07-09 DIAGNOSIS — G20 Parkinson's disease: Secondary | ICD-10-CM | POA: Diagnosis not present

## 2016-07-09 DIAGNOSIS — R0789 Other chest pain: Secondary | ICD-10-CM | POA: Diagnosis not present

## 2016-07-09 DIAGNOSIS — S199XXA Unspecified injury of neck, initial encounter: Secondary | ICD-10-CM | POA: Diagnosis not present

## 2016-07-09 DIAGNOSIS — I10 Essential (primary) hypertension: Secondary | ICD-10-CM | POA: Diagnosis not present

## 2016-07-09 DIAGNOSIS — Z8673 Personal history of transient ischemic attack (TIA), and cerebral infarction without residual deficits: Secondary | ICD-10-CM | POA: Diagnosis not present

## 2016-07-09 DIAGNOSIS — S0990XA Unspecified injury of head, initial encounter: Secondary | ICD-10-CM | POA: Diagnosis not present

## 2016-07-09 DIAGNOSIS — R51 Headache: Secondary | ICD-10-CM | POA: Diagnosis not present

## 2016-07-09 DIAGNOSIS — J449 Chronic obstructive pulmonary disease, unspecified: Secondary | ICD-10-CM | POA: Diagnosis not present

## 2016-07-09 DIAGNOSIS — E039 Hypothyroidism, unspecified: Secondary | ICD-10-CM | POA: Diagnosis not present

## 2016-07-09 DIAGNOSIS — R079 Chest pain, unspecified: Secondary | ICD-10-CM | POA: Diagnosis not present

## 2016-07-09 DIAGNOSIS — Z7982 Long term (current) use of aspirin: Secondary | ICD-10-CM | POA: Diagnosis not present

## 2016-07-09 DIAGNOSIS — M25511 Pain in right shoulder: Secondary | ICD-10-CM | POA: Diagnosis not present

## 2016-07-09 DIAGNOSIS — S4991XA Unspecified injury of right shoulder and upper arm, initial encounter: Secondary | ICD-10-CM | POA: Diagnosis not present

## 2016-07-09 DIAGNOSIS — R519 Headache, unspecified: Secondary | ICD-10-CM

## 2016-07-09 MED ORDER — DIPHENHYDRAMINE HCL 50 MG/ML IJ SOLN
6.2500 mg | Freq: Once | INTRAMUSCULAR | Status: AC
  Administered 2016-07-09: 6.5 mg via INTRAVENOUS
  Filled 2016-07-09: qty 1

## 2016-07-09 MED ORDER — HYDROCODONE-ACETAMINOPHEN 5-325 MG PO TABS: 1.0000 | ORAL_TABLET | Freq: Once | ORAL | Status: DC

## 2016-07-09 MED ORDER — ONDANSETRON HCL 4 MG/2ML IJ SOLN
4.0000 mg | Freq: Once | INTRAMUSCULAR | Status: AC
  Administered 2016-07-09: 4 mg via INTRAVENOUS
  Filled 2016-07-09: qty 2

## 2016-07-09 MED ORDER — MORPHINE SULFATE (PF) 4 MG/ML IV SOLN
4.0000 mg | Freq: Once | INTRAVENOUS | Status: AC
  Administered 2016-07-09: 4 mg via INTRAVENOUS
  Filled 2016-07-09: qty 1

## 2016-07-09 MED ORDER — ONDANSETRON 4 MG PO TBDP: 4.0000 mg | ORAL_TABLET | Freq: Once | ORAL | Status: DC

## 2016-07-09 NOTE — ED Provider Notes (Signed)
[facility] DEPT Provider Note   CSN: [id number] Arrival date & time: 07/09/16  2126     History   Chief Complaint Chief Complaint  Patient presents with  . Chest Pain    HPI [name] is a 81 y.o. female.  She presents with multiple complaints. Primary complaint that she has a headache. She states she had a fall and that her eyes were black and blue. She can murmur when this was what it was "a while back". States she's had a left-sided headache since that time. Her second complaint is that her "rubbery" is painful. She is chronic pain right shoulder. She again refers back to this fall that she has had and states it is been worse since that time. She also states for 3 days she's had intermittent episodes of pain in her chest. States is described as a "sharp pain" she points her low substernal chest is been intermittent lasting a few seconds at a time. No past similar episodes. States she has chronic headaches, and chronic shoulder pain.  HPI  Past Medical History:  Diagnosis Date  . Acute cystitis   . Anemia   . Anxiety   . Aortic stenosis, mild 11/18/2013  . Asthma   . Back pain   . Bronchitis, acute   . Carotid artery occlusion   . Cerebrovascular disease   . COPD (chronic obstructive pulmonary disease) ([place])   . Depression   . Diverticulosis of colon   . DJD (degenerative joint disease)   . Fall 07/05/2015  . GERD (gastroesophageal reflux disease)   . History of sudden visual loss   . Hyperlipidemia   . Hypertension   . Hypothyroidism   . Pneumonia 2015   hx of x 2   . Pulmonary hypertension, moderate to severe 11/19/2014  . Shortness of breath    with exertion   . 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 Transient ischemic attack   . UTI (lower urinary tract infection)    hx of     Patient Active Problem List   Diagnosis Date Noted  . UTI (lower urinary tract infection) 10/17/2015  . Dementia in Alzheimer's disease 10/17/2015  .  Encephalopathy, metabolic [id number]  . Contusion, hip 07/07/2015  . Falls frequently 07/07/2015  . Cervical radiculopathy 07/07/2015  . Anxiety and depression 07/07/2015  . Gait disturbance 07/07/2015  . Hyperglycemia 07/07/2015  . FTT (failure to thrive) in adult 07/07/2015  . Parkinson disease ([place])   . UTI (urinary tract infection) 07/05/2015  . Chronic neck pain 07/05/2015  . Debility 07/05/2015  . Fatigue 01/19/2015  . Dysuria 01/19/2015  . Chronic restrictive lung disease 01/04/2015  . DOE (dyspnea on exertion) 11/19/2014  . Pulmonary hypertension, moderate to severe 11/19/2014  . Irregular heart beats 08/12/2014  . Aftercare following surgery of the circulatory system, [place] 04/19/2014  . Pain of left lower extremity-Knee 04/19/2014  . Weakness 02/11/2014  . Loss of weight 02/11/2014  . Valvular heart disease 01/19/2014  . Pulmonary hypertension 01/19/2014  . CAP (community acquired pneumonia) 01/08/2014  . Asthma with acute exacerbation 01/06/2014  . Fall 01/06/2014  . Aortic stenosis, mild 11/18/2013  . Preventative health care 11/18/2013  . Anemia, unspecified 11/18/2013  . Tremor 09/21/2013  . Dyspnea 05/18/2013  . Physical deconditioning 05/18/2013  . OA (osteoarthritis) of knee 11/17/2012  . Hyperlipidemia 11/04/2012  . Occlusion and stenosis of carotid artery without mention of cerebral infarction 03/06/2012  . ASTHMA 10/09/2010  .  Sudden visual loss 01/10/2009  . TRANSIENT ISCHEMIC ATTACK 01/10/2009  . CEREBROVASCULAR DISEASE 11/19/2007  . BRONCHITIS, RECURRENT 11/19/2007  . DIVERTICULOSIS OF COLON 11/19/2007  . Hypothyroidism 08/15/2007  . Anxiety state 08/15/2007  . Depression 08/15/2007  . Essential hypertension 08/15/2007  . GERD 08/15/2007  . Osteoarthritis 08/15/2007  . BACK PAIN, LUMBAR 08/15/2007    Past Surgical History:  Procedure Laterality Date  . CARDIAC CATHETERIZATION  02/18/14  . CARDIOTHORACIC PROCEDURE  01/13/2009   Right   .  COLECTOMY    . ENDARTERECTOMY     right carotid endartarectomy - 2010  . FOOT SURGERY     nerve cut between toes   . LEFT AND RIGHT HEART CATHETERIZATION WITH CORONARY ANGIOGRAM N/A 02/18/2014   Procedure: LEFT AND RIGHT HEART CATHETERIZATION WITH CORONARY ANGIOGRAM;  Surgeon: Larey Dresser, MD;  Location: [facility] CATH LAB;  Service: Cardiovascular;  Laterality: N/A;  . REPLACEMENT TOTAL KNEE     right and left knee  . TEE WITHOUT CARDIOVERSION N/A 02/08/2014   Procedure: TRANSESOPHAGEAL ECHOCARDIOGRAM (TEE);  Surgeon: Larey Dresser, MD;  Location: [facility];  Service: Cardiovascular;  Laterality: N/A;  . TOTAL KNEE ARTHROPLASTY Left 11/17/2012   Procedure: TOTAL KNEE ARTHROPLASTY;  Surgeon: Gearlean Alf, MD;  Location: [facility] ORS;  Service: Orthopedics;  Laterality: Left;  . TUBAL LIGATION    . VAGINAL HYSTERECTOMY      OB History    No data available       Home Medications    Prior to Admission medications   Medication Sig Start Date End Date Taking? Authorizing Provider  acetaminophen (TYLENOL) 325 MG tablet Take 325 mg by mouth every 4 (four) hours as needed for fever.   Yes Historical Provider, MD  ADVAIR HFA 230-21 MCG/ACT inhaler Inhale 2 puffs into the lungs 2 (two) times daily. Patient taking differently: Inhale 2 puffs into the lungs daily.  11/23/14  Yes Biagio Borg, MD  albuterol (PROVENTIL HFA;VENTOLIN HFA) 108 (90 BASE) MCG/ACT inhaler Inhale 2 puffs into the lungs every 6 (six) hours as needed for wheezing or shortness of breath. 11/19/14  Yes Biagio Borg, MD  amLODipine (NORVASC) 5 MG tablet Take 1 tablet (5 mg total) by mouth daily. Must keep appt for future refills Patient taking differently: Take 5 mg by mouth daily.  10/03/15  Yes Biagio Borg, MD  aspirin EC 81 MG tablet Take 1 tablet (81 mg total) by mouth daily. Patient taking differently: Take 81 mg by mouth daily. Do not crush 01/26/14  Yes Larey Dresser, MD  baclofen (LIORESAL) 10 MG tablet Take 5 mg by mouth  every 8 (eight) hours as needed (spasms).  10/06/15  Yes Historical Provider, MD  benzonatate (TESSALON) 100 MG capsule Take 100 mg by mouth 3 (three) times daily as needed for cough.    Yes Historical Provider, MD  bisacodyl (DULCOLAX) 5 MG EC tablet Take 1 tablet (5 mg total) by mouth daily as needed for moderate constipation. 10/20/15  Yes Annita Brod, MD  calcium-vitamin D (OSCAL WITH D) 500-200 MG-UNIT TABS tablet Take 1 tablet by mouth 2 (two) times daily.   Yes Historical Provider, MD  carbidopa-levodopa (SINEMET IR) 25-100 MG tablet Take 2 tablets by mouth 3 (three) times daily. for parkinson's disease   Yes Historical Provider, MD  Cranberry 250 MG CAPS Take 250 mg by mouth 2 (two) times daily. For UTI prevention   Yes Historical Provider, MD  diazepam (VALIUM) 2 MG tablet  1/2 - 1 tab by mouth twice per day as needed Patient taking differently: Take 2 mg by mouth 2 (two) times daily.  10/20/15  Yes Annita Brod, MD  divalproex (DEPAKOTE SPRINKLE) 125 MG capsule Take 125 mg by mouth 2 (two) times daily. Do not crush - for anxiety and mood   Yes Historical Provider, MD  escitalopram (LEXAPRO) 20 MG tablet Take 20 mg by mouth daily.   Yes Historical Provider, MD  ferrous sulfate 325 (65 FE) MG tablet Take 325 mg by mouth daily with breakfast. Do not crush   Yes Historical Provider, MD  fish oil-omega-3 fatty acids 1000 MG capsule Take 1 g by mouth daily.    Yes Historical Provider, MD  fluticasone (FLONASE) 50 MCG/ACT nasal spray Place 2 sprays into both nostrils daily as needed for allergies or rhinitis.    Yes Historical Provider, MD  guaifenesin (ROBAFEN) 100 MG/5ML syrup Take 300 mg by mouth 3 (three) times daily as needed for cough.    Yes Historical Provider, MD  levothyroxine (SYNTHROID) 88 MCG tablet Take 88 mcg by mouth daily with breakfast.    Yes Historical Provider, MD  meloxicam (MOBIC) 15 MG tablet Take 15 mg by mouth daily as needed for pain.    Yes Historical Provider, MD    Menthol, Topical Analgesic, (BIOFREEZE EX) Apply 1 application topically 4 (four) times daily as needed (pain).   Yes Historical Provider, MD  Nutritional Supplements (NUTRITIONAL SUPPLEMENT PO) Take 120 mLs by mouth 2 (two) times daily. [employer]   Yes Historical Provider, MD  omeprazole (PRILOSEC) 20 MG capsule Take 1 capsule (20 mg total) by mouth daily. Patient taking differently: Take 20 mg by mouth daily. Do not crush 08/12/14  Yes Biagio Borg, MD  polyethylene glycol [facility]) packet Take 17 g by mouth daily as needed for mild constipation. Patient taking differently: Take 17 g by mouth daily as needed for mild constipation. Mix in 8 oz water and drink 10/20/15  Yes Annita Brod, MD  pravastatin (PRAVACHOL) 20 MG tablet Take 1 tablet (20 mg total) by mouth daily at 6 PM. 07/09/15  Yes Kelvin Cellar, MD  sucralfate (CARAFATE) 1 GM/10ML suspension Take 1 g by mouth every evening.   Yes Historical Provider, MD  traMADol (ULTRAM) 50 MG tablet Take 25 mg by mouth every 8 (eight) hours as needed (pain control).    Yes Historical Provider, MD  Zinc Oxide (AVEENO BABY SOOTHING RELIEF EX) Apply 1 application topically See admin instructions. Apply to bilateral buttocks and posterior thigh as needed for irritation   Yes Historical Provider, MD  escitalopram (LEXAPRO) 10 MG tablet Take 1 tablet (10 mg total) by mouth daily. Patient not taking: Reported on 07/09/2016 05/20/14   Biagio Borg, MD    Family History Family History  Problem Relation Age of Onset  . Emphysema Father   . Heart disease Mother   . Hyperlipidemia Mother   . Hypertension Mother   . Coronary artery disease Sister   . Heart disease Sister   . Hypertension Sister   . Varicose Veins Sister   . AAA (abdominal aortic aneurysm) Sister   . Rheumatic fever Brother     x2  . Heart attack Brother     Social History Social History  Substance Use Topics  . Smoking status: Never Smoker  . Smokeless tobacco:  Never Used  . Alcohol use No     Allergies   Relafen [nabumetone]; Lipitor [atorvastatin];  Other; and Penicillins   Review of Systems Review of Systems  Constitutional: Negative for appetite change, chills, diaphoresis, fatigue and fever.  HENT: Negative for mouth sores, sore throat and trouble swallowing.   Eyes: Negative for visual disturbance.  Respiratory: Negative for cough, chest tightness, shortness of breath and wheezing.   Cardiovascular: Positive for chest pain.  Gastrointestinal: Negative for abdominal distention, abdominal pain, diarrhea, nausea and vomiting.  Endocrine: Negative for polydipsia, polyphagia and polyuria.  Genitourinary: Negative for dysuria, frequency and hematuria.  Musculoskeletal: Positive for arthralgias. Negative for gait problem.  Skin: Negative for color change, pallor and rash.  Neurological: Positive for headaches. Negative for dizziness, syncope and light-headedness.  Hematological: Does not bruise/bleed easily.  Psychiatric/Behavioral: Negative for behavioral problems and confusion.     Physical Exam Updated Vital Signs BP 121/56   Pulse 65   Temp 98 F (36.7 C) (Oral)   Resp 18   Ht 5\' 5"  (1.651 m) Comment: Simultaneous filing. User may not have seen previous data.  Wt 158 lb (71.7 kg) Comment: Simultaneous filing. User may not have seen previous data.  SpO2 93%   BMI 26.29 kg/m   Physical Exam  Constitutional: She is oriented to person, place, and time. She appears well-developed and well-nourished. No distress.  HENT:  Head: Normocephalic.  No signs of acute trauma to the head. No ecchymosis. No blood over the TMs, mastoids, or from ears nose or mouth. No sign of intraoral trauma. Nontender the mid thigh neck and spine. She uses a U shaped pillow around her neck that she states she uses for chronic neck pain and headaches.  Eyes: Conjunctivae are normal. Pupils are equal, round, and reactive to light. No scleral icterus.  Neck:  Normal range of motion. Neck supple. No thyromegaly present.  Cardiovascular: Normal rate and regular rhythm.  Exam reveals no gallop and no friction rub.   No murmur heard. Pulmonary/Chest: Effort normal and breath sounds normal. No respiratory distress. She has no wheezes. She has no rales.  Abdominal: Soft. Bowel sounds are normal. She exhibits no distension. There is no tenderness. There is no rebound.  Musculoskeletal: Normal range of motion.  Full range of motion of the right shoulder. She has a painful arc starting an approximate 45. No ecchymosis noted.  Neurological: She is alert and oriented to person, place, and time.  Skin: Skin is warm and dry. No rash noted.  Psychiatric: She has a normal mood and affect. Her behavior is normal.     ED Treatments / Results  Labs (all labs ordered are listed, but only abnormal results are displayed) Labs Reviewed  BASIC METABOLIC PANEL - Abnormal; Notable for the following:       Result Value   Glucose, Bld 102 (*)    BUN 23 (*)    Creatinine, Ser 1.33 (*)    GFR calc non Af Amer 37 (*)    GFR calc Af Amer 43 (*)    All other components within normal limits  CBC - Abnormal; Notable for the following:    RBC 3.42 (*)    Hemoglobin 11.2 (*)    HCT 33.9 (*)    All other components within normal limits  I-STAT TROPOININ, ED    EKG  EKG Interpretation  Date/Time:  Monday July 09 2016 21:36:20 EST Ventricular Rate:  67 PR Interval:    QRS Duration: 101 QT Interval:  392 QTC Calculation: 414 R Axis:   42 Text Interpretation:  Sinus rhythm Low  voltage, extremity leads Confirmed by Jeneen Rinks  MD, [place] ([id number]) on 07/09/2016 9:53:04 PM       Radiology Dg Chest 2 View  Result Date: 07/09/2016 CLINICAL DATA:  Chest pain for 3 days. Right shoulder pain after fall last week. EXAM: CHEST  2 VIEW COMPARISON:  10/16/2015 CXR FINDINGS: Emphysematous hyperinflation of the lungs without pneumonic consolidation, effusion or pneumothorax.  Scarring and/or atelectasis at the left lung base laterally. The heart is normal in size with aortic atherosclerosis. No acute osseous abnormality is seen. IMPRESSION: 1. Emphysematous hyperinflation of the lungs with left basilar atelectasis and/or scarring. 2. No pneumonic consolidation or CHF. 3. No suspicious osseous abnormality. 4. Aortic atherosclerosis. Electronically Signed   By: Ashley Royalty M.D.   On: 07/09/2016 22:48   Dg Shoulder Right  Result Date: 07/09/2016 CLINICAL DATA:  Right shoulder pain after fall last week. EXAM: RIGHT SHOULDER - 2+ VIEW COMPARISON:  None. FINDINGS: There is no evidence of fracture or dislocation. Glenohumeral and AC joints are maintained. The adjacent ribs and lung are unremarkable. There is no evidence of arthropathy or other focal bone abnormality. Soft tissues are unremarkable. IMPRESSION: No acute osseous abnormality of the right shoulder. Electronically Signed   By: Ashley Royalty M.D.   On: 07/09/2016 22:49   Ct Head Wo Contrast  Result Date: 07/09/2016 CLINICAL DATA:  81 y/o F; status post fall 2 weeks ago with head pain. EXAM: CT HEAD WITHOUT CONTRAST CT CERVICAL SPINE WITHOUT CONTRAST TECHNIQUE: Multidetector CT imaging of the head and cervical spine was performed following the standard protocol without intravenous contrast. Multiplanar CT image reconstructions of the cervical spine were also generated. COMPARISON:  10/16/2015 CT of the head and cervical spine. FINDINGS: CT HEAD FINDINGS Brain: No evidence of acute infarction, hemorrhage, hydrocephalus, extra-axial collection or mass lesion/mass effect. Stable moderate chronic microvascular ischemic changes and parenchymal volume loss. Vascular: No hyperdense vessel. Calcific atherosclerosis of carotid siphons. Skull: No displaced calvarial fracture. Sinuses/Orbits: Minimal left frontal sinus mucosal thickening. Other: None. CT CERVICAL SPINE FINDINGS Alignment: Straightening of cervical lordosis with mild  reversal at the C3-4 level without interval change. Stable grade 1 C2-3 anterolisthesis. Skull base and vertebrae: No acute fracture. No primary bone lesion or focal pathologic process. Soft tissues and spinal canal: No prevertebral fluid or swelling. No visible canal hematoma. Disc levels: Moderate cervical spondylosis greatest from the C3 through C6 levels or prominent disc osteophyte complexes result in probably moderate canal stenosis greatest at the C4-5 level. Upper chest: Scarring with calcifications in the lung apices bilaterally. Other: Dense calcific atherosclerosis of the carotid bifurcations bilaterally. IMPRESSION: 1. No acute intracranial abnormality identified. 2. Stable moderate chronic microvascular ischemic changes and parenchymal volume loss of the brain. 3. No acute fracture or dislocation of the cervical spine. 4. Stable cervical spondylosis greatest at the C3 through C6 levels. Electronically Signed   By: Kristine Garbe M.D.   On: 07/09/2016 23:20   Ct Cervical Spine Wo Contrast  Result Date: 07/09/2016 CLINICAL DATA:  81 y/o F; status post fall 2 weeks ago with head pain. EXAM: CT HEAD WITHOUT CONTRAST CT CERVICAL SPINE WITHOUT CONTRAST TECHNIQUE: Multidetector CT imaging of the head and cervical spine was performed following the standard protocol without intravenous contrast. Multiplanar CT image reconstructions of the cervical spine were also generated. COMPARISON:  10/16/2015 CT of the head and cervical spine. FINDINGS: CT HEAD FINDINGS Brain: No evidence of acute infarction, hemorrhage, hydrocephalus, extra-axial collection or mass lesion/mass effect. Stable moderate chronic microvascular  ischemic changes and parenchymal volume loss. Vascular: No hyperdense vessel. Calcific atherosclerosis of carotid siphons. Skull: No displaced calvarial fracture. Sinuses/Orbits: Minimal left frontal sinus mucosal thickening. Other: None. CT CERVICAL SPINE FINDINGS Alignment: Straightening  of cervical lordosis with mild reversal at the C3-4 level without interval change. Stable grade 1 C2-3 anterolisthesis. Skull base and vertebrae: No acute fracture. No primary bone lesion or focal pathologic process. Soft tissues and spinal canal: No prevertebral fluid or swelling. No visible canal hematoma. Disc levels: Moderate cervical spondylosis greatest from the C3 through C6 levels or prominent disc osteophyte complexes result in probably moderate canal stenosis greatest at the C4-5 level. Upper chest: Scarring with calcifications in the lung apices bilaterally. Other: Dense calcific atherosclerosis of the carotid bifurcations bilaterally. IMPRESSION: 1. No acute intracranial abnormality identified. 2. Stable moderate chronic microvascular ischemic changes and parenchymal volume loss of the brain. 3. No acute fracture or dislocation of the cervical spine. 4. Stable cervical spondylosis greatest at the C3 through C6 levels. Electronically Signed   By: Kristine Garbe M.D.   On: 07/09/2016 23:20    Procedures Procedures (including critical care time)  Medications Ordered in ED Medications  ondansetron [facility]) injection 4 mg (4 mg Intravenous Given 07/09/16 2224)  morphine 4 MG/ML injection 4 mg (4 mg Intravenous Given 07/09/16 2223)  diphenhydrAMINE (BENADRYL) injection 6.5 mg (6.5 mg Intravenous Given 07/09/16 2239)     Initial Impression / Assessment and Plan / ED Course  I have reviewed the triage vital signs and the nursing notes.  Pertinent labs & imaging results that were available during my care of the patient were reviewed by me and considered in my medical decision making (see chart for details).  Clinical Course    Symptoms have acute exacerbation chronic conditions. Plan CT of her head, EKG showed no acute ischemic changes. Plan one set of enzymes as her pain has been present for more than 3 days. Right shoulder x-ray. Reevaluation.  Final Clinical Impressions(s) / ED  Diagnoses   Final diagnoses:  Chest pain, unspecified type  Nonintractable headache, unspecified chronicity pattern, unspecified headache type    New Prescriptions Discharge Medication List as of 07/10/2016 12:51 AM       Tanna Furry, MD 07/10/16 1801

## 2016-07-09 NOTE — ED Triage Notes (Signed)
Per EMS : Pt complains of pain in multiple places, chest, back, r shoulder, and headache.  Pt chest pain has been going on for three days, pain is 5/10, sub-sternal and is intermittent. Pt has no cardiac hx.

## 2016-07-10 DIAGNOSIS — M25519 Pain in unspecified shoulder: Secondary | ICD-10-CM | POA: Diagnosis not present

## 2016-07-10 DIAGNOSIS — R0789 Other chest pain: Secondary | ICD-10-CM | POA: Diagnosis not present

## 2016-07-10 DIAGNOSIS — R51 Headache: Secondary | ICD-10-CM | POA: Diagnosis not present

## 2016-07-10 LAB — CBC
HCT: 33.9 % — ABNORMAL LOW (ref 36.0–46.0)
HEMOGLOBIN: 11.2 g/dL — AB (ref 12.0–15.0)
MCH: 32.7 pg (ref 26.0–34.0)
MCHC: 33 g/dL (ref 30.0–36.0)
MCV: 99.1 fL (ref 78.0–100.0)
PLATELETS: 164 10*3/uL (ref 150–400)
RBC: 3.42 MIL/uL — AB (ref 3.87–5.11)
RDW: 13.9 % (ref 11.5–15.5)
WBC: 5.4 10*3/uL (ref 4.0–10.5)

## 2016-07-10 LAB — BASIC METABOLIC PANEL
ANION GAP: 10 (ref 5–15)
BUN: 23 mg/dL — ABNORMAL HIGH (ref 6–20)
CALCIUM: 9.5 mg/dL (ref 8.9–10.3)
CO2: 23 mmol/L (ref 22–32)
CREATININE: 1.33 mg/dL — AB (ref 0.44–1.00)
Chloride: 104 mmol/L (ref 101–111)
GFR calc non Af Amer: 37 mL/min — ABNORMAL LOW (ref >60)
GFR, EST AFRICAN AMERICAN: 43 mL/min — AB (ref >60)
Glucose, Bld: 102 mg/dL — ABNORMAL HIGH (ref 65–99)
Potassium: 4.3 mmol/L (ref 3.5–5.1)
SODIUM: 137 mmol/L (ref 135–145)

## 2016-07-10 LAB — I-STAT TROPONIN, ED: TROPONIN I, POC: 0.01 ng/mL (ref 0.00–0.08)

## 2016-07-16 DIAGNOSIS — M25511 Pain in right shoulder: Secondary | ICD-10-CM | POA: Diagnosis not present

## 2016-08-16 DIAGNOSIS — N390 Urinary tract infection, site not specified: Secondary | ICD-10-CM | POA: Diagnosis not present

## 2016-08-28 DIAGNOSIS — R269 Unspecified abnormalities of gait and mobility: Secondary | ICD-10-CM | POA: Diagnosis not present

## 2016-08-28 DIAGNOSIS — G20 Parkinson's disease: Secondary | ICD-10-CM | POA: Diagnosis not present

## 2016-08-28 DIAGNOSIS — D649 Anemia, unspecified: Secondary | ICD-10-CM | POA: Diagnosis not present

## 2016-08-28 DIAGNOSIS — E039 Hypothyroidism, unspecified: Secondary | ICD-10-CM | POA: Diagnosis not present

## 2016-08-28 DIAGNOSIS — R1319 Other dysphagia: Secondary | ICD-10-CM | POA: Diagnosis not present

## 2016-08-28 DIAGNOSIS — G308 Other Alzheimer's disease: Secondary | ICD-10-CM | POA: Diagnosis not present

## 2016-08-28 DIAGNOSIS — I10 Essential (primary) hypertension: Secondary | ICD-10-CM | POA: Diagnosis not present

## 2016-08-28 DIAGNOSIS — J449 Chronic obstructive pulmonary disease, unspecified: Secondary | ICD-10-CM | POA: Diagnosis not present

## 2016-08-31 DIAGNOSIS — Z7982 Long term (current) use of aspirin: Secondary | ICD-10-CM | POA: Diagnosis not present

## 2016-08-31 DIAGNOSIS — J449 Chronic obstructive pulmonary disease, unspecified: Secondary | ICD-10-CM | POA: Diagnosis not present

## 2016-08-31 DIAGNOSIS — G309 Alzheimer's disease, unspecified: Secondary | ICD-10-CM | POA: Diagnosis not present

## 2016-08-31 DIAGNOSIS — D649 Anemia, unspecified: Secondary | ICD-10-CM | POA: Diagnosis not present

## 2016-08-31 DIAGNOSIS — F0280 Dementia in other diseases classified elsewhere without behavioral disturbance: Secondary | ICD-10-CM | POA: Diagnosis not present

## 2016-08-31 DIAGNOSIS — R1311 Dysphagia, oral phase: Secondary | ICD-10-CM | POA: Diagnosis not present

## 2016-08-31 DIAGNOSIS — M545 Low back pain: Secondary | ICD-10-CM | POA: Diagnosis not present

## 2016-08-31 DIAGNOSIS — G20 Parkinson's disease: Secondary | ICD-10-CM | POA: Diagnosis not present

## 2016-08-31 DIAGNOSIS — I10 Essential (primary) hypertension: Secondary | ICD-10-CM | POA: Diagnosis not present

## 2016-08-31 DIAGNOSIS — W19XXXS Unspecified fall, sequela: Secondary | ICD-10-CM | POA: Diagnosis not present

## 2016-08-31 DIAGNOSIS — F418 Other specified anxiety disorders: Secondary | ICD-10-CM | POA: Diagnosis not present

## 2016-08-31 DIAGNOSIS — M5030 Other cervical disc degeneration, unspecified cervical region: Secondary | ICD-10-CM | POA: Diagnosis not present

## 2016-08-31 DIAGNOSIS — Z87440 Personal history of urinary (tract) infections: Secondary | ICD-10-CM | POA: Diagnosis not present

## 2016-09-04 DIAGNOSIS — J449 Chronic obstructive pulmonary disease, unspecified: Secondary | ICD-10-CM | POA: Diagnosis not present

## 2016-09-04 DIAGNOSIS — G20 Parkinson's disease: Secondary | ICD-10-CM | POA: Diagnosis not present

## 2016-09-04 DIAGNOSIS — E039 Hypothyroidism, unspecified: Secondary | ICD-10-CM | POA: Diagnosis not present

## 2016-09-04 DIAGNOSIS — D649 Anemia, unspecified: Secondary | ICD-10-CM | POA: Diagnosis not present

## 2016-09-04 DIAGNOSIS — R1319 Other dysphagia: Secondary | ICD-10-CM | POA: Diagnosis not present

## 2016-09-04 DIAGNOSIS — Z8379 Family history of other diseases of the digestive system: Secondary | ICD-10-CM | POA: Diagnosis not present

## 2016-09-04 DIAGNOSIS — I10 Essential (primary) hypertension: Secondary | ICD-10-CM | POA: Diagnosis not present

## 2016-09-04 DIAGNOSIS — R269 Unspecified abnormalities of gait and mobility: Secondary | ICD-10-CM | POA: Diagnosis not present

## 2016-09-05 DIAGNOSIS — W19XXXS Unspecified fall, sequela: Secondary | ICD-10-CM | POA: Diagnosis not present

## 2016-09-05 DIAGNOSIS — R1311 Dysphagia, oral phase: Secondary | ICD-10-CM | POA: Diagnosis not present

## 2016-09-05 DIAGNOSIS — G20 Parkinson's disease: Secondary | ICD-10-CM | POA: Diagnosis not present

## 2016-09-05 DIAGNOSIS — J449 Chronic obstructive pulmonary disease, unspecified: Secondary | ICD-10-CM | POA: Diagnosis not present

## 2016-09-05 DIAGNOSIS — M545 Low back pain: Secondary | ICD-10-CM | POA: Diagnosis not present

## 2016-09-05 DIAGNOSIS — F418 Other specified anxiety disorders: Secondary | ICD-10-CM | POA: Diagnosis not present

## 2016-09-07 DIAGNOSIS — M545 Low back pain: Secondary | ICD-10-CM | POA: Diagnosis not present

## 2016-09-07 DIAGNOSIS — J449 Chronic obstructive pulmonary disease, unspecified: Secondary | ICD-10-CM | POA: Diagnosis not present

## 2016-09-07 DIAGNOSIS — R1311 Dysphagia, oral phase: Secondary | ICD-10-CM | POA: Diagnosis not present

## 2016-09-07 DIAGNOSIS — W19XXXS Unspecified fall, sequela: Secondary | ICD-10-CM | POA: Diagnosis not present

## 2016-09-07 DIAGNOSIS — F418 Other specified anxiety disorders: Secondary | ICD-10-CM | POA: Diagnosis not present

## 2016-09-07 DIAGNOSIS — G20 Parkinson's disease: Secondary | ICD-10-CM | POA: Diagnosis not present

## 2016-09-11 DIAGNOSIS — R1311 Dysphagia, oral phase: Secondary | ICD-10-CM | POA: Diagnosis not present

## 2016-09-11 DIAGNOSIS — G20 Parkinson's disease: Secondary | ICD-10-CM | POA: Diagnosis not present

## 2016-09-11 DIAGNOSIS — J449 Chronic obstructive pulmonary disease, unspecified: Secondary | ICD-10-CM | POA: Diagnosis not present

## 2016-09-11 DIAGNOSIS — W19XXXS Unspecified fall, sequela: Secondary | ICD-10-CM | POA: Diagnosis not present

## 2016-09-11 DIAGNOSIS — M545 Low back pain: Secondary | ICD-10-CM | POA: Diagnosis not present

## 2016-09-11 DIAGNOSIS — F418 Other specified anxiety disorders: Secondary | ICD-10-CM | POA: Diagnosis not present

## 2016-09-12 DIAGNOSIS — J449 Chronic obstructive pulmonary disease, unspecified: Secondary | ICD-10-CM | POA: Diagnosis not present

## 2016-09-12 DIAGNOSIS — G20 Parkinson's disease: Secondary | ICD-10-CM | POA: Diagnosis not present

## 2016-09-12 DIAGNOSIS — M545 Low back pain: Secondary | ICD-10-CM | POA: Diagnosis not present

## 2016-09-12 DIAGNOSIS — W19XXXS Unspecified fall, sequela: Secondary | ICD-10-CM | POA: Diagnosis not present

## 2016-09-12 DIAGNOSIS — R1311 Dysphagia, oral phase: Secondary | ICD-10-CM | POA: Diagnosis not present

## 2016-09-12 DIAGNOSIS — R0989 Other specified symptoms and signs involving the circulatory and respiratory systems: Secondary | ICD-10-CM | POA: Diagnosis not present

## 2016-09-12 DIAGNOSIS — F418 Other specified anxiety disorders: Secondary | ICD-10-CM | POA: Diagnosis not present

## 2016-09-13 DIAGNOSIS — J449 Chronic obstructive pulmonary disease, unspecified: Secondary | ICD-10-CM | POA: Diagnosis not present

## 2016-09-13 DIAGNOSIS — G20 Parkinson's disease: Secondary | ICD-10-CM | POA: Diagnosis not present

## 2016-09-13 DIAGNOSIS — R1311 Dysphagia, oral phase: Secondary | ICD-10-CM | POA: Diagnosis not present

## 2016-09-13 DIAGNOSIS — W19XXXS Unspecified fall, sequela: Secondary | ICD-10-CM | POA: Diagnosis not present

## 2016-09-13 DIAGNOSIS — M545 Low back pain: Secondary | ICD-10-CM | POA: Diagnosis not present

## 2016-09-13 DIAGNOSIS — F418 Other specified anxiety disorders: Secondary | ICD-10-CM | POA: Diagnosis not present

## 2016-09-17 DIAGNOSIS — M545 Low back pain: Secondary | ICD-10-CM | POA: Diagnosis not present

## 2016-09-17 DIAGNOSIS — F418 Other specified anxiety disorders: Secondary | ICD-10-CM | POA: Diagnosis not present

## 2016-09-17 DIAGNOSIS — J449 Chronic obstructive pulmonary disease, unspecified: Secondary | ICD-10-CM | POA: Diagnosis not present

## 2016-09-17 DIAGNOSIS — G20 Parkinson's disease: Secondary | ICD-10-CM | POA: Diagnosis not present

## 2016-09-17 DIAGNOSIS — W19XXXS Unspecified fall, sequela: Secondary | ICD-10-CM | POA: Diagnosis not present

## 2016-09-17 DIAGNOSIS — R1311 Dysphagia, oral phase: Secondary | ICD-10-CM | POA: Diagnosis not present

## 2016-09-18 DIAGNOSIS — J449 Chronic obstructive pulmonary disease, unspecified: Secondary | ICD-10-CM | POA: Diagnosis not present

## 2016-09-18 DIAGNOSIS — E039 Hypothyroidism, unspecified: Secondary | ICD-10-CM | POA: Diagnosis not present

## 2016-09-18 DIAGNOSIS — K219 Gastro-esophageal reflux disease without esophagitis: Secondary | ICD-10-CM | POA: Diagnosis not present

## 2016-09-18 DIAGNOSIS — F418 Other specified anxiety disorders: Secondary | ICD-10-CM | POA: Diagnosis not present

## 2016-09-18 DIAGNOSIS — G20 Parkinson's disease: Secondary | ICD-10-CM | POA: Diagnosis not present

## 2016-09-18 DIAGNOSIS — M545 Low back pain: Secondary | ICD-10-CM | POA: Diagnosis not present

## 2016-09-18 DIAGNOSIS — R1311 Dysphagia, oral phase: Secondary | ICD-10-CM | POA: Diagnosis not present

## 2016-09-18 DIAGNOSIS — R269 Unspecified abnormalities of gait and mobility: Secondary | ICD-10-CM | POA: Diagnosis not present

## 2016-09-18 DIAGNOSIS — I10 Essential (primary) hypertension: Secondary | ICD-10-CM | POA: Diagnosis not present

## 2016-09-18 DIAGNOSIS — W19XXXS Unspecified fall, sequela: Secondary | ICD-10-CM | POA: Diagnosis not present

## 2016-09-18 DIAGNOSIS — D649 Anemia, unspecified: Secondary | ICD-10-CM | POA: Diagnosis not present

## 2016-09-18 DIAGNOSIS — R1319 Other dysphagia: Secondary | ICD-10-CM | POA: Diagnosis not present

## 2016-09-19 DIAGNOSIS — M545 Low back pain: Secondary | ICD-10-CM | POA: Diagnosis not present

## 2016-09-19 DIAGNOSIS — R1311 Dysphagia, oral phase: Secondary | ICD-10-CM | POA: Diagnosis not present

## 2016-09-19 DIAGNOSIS — W19XXXS Unspecified fall, sequela: Secondary | ICD-10-CM | POA: Diagnosis not present

## 2016-09-19 DIAGNOSIS — G20 Parkinson's disease: Secondary | ICD-10-CM | POA: Diagnosis not present

## 2016-09-19 DIAGNOSIS — F418 Other specified anxiety disorders: Secondary | ICD-10-CM | POA: Diagnosis not present

## 2016-09-19 DIAGNOSIS — J449 Chronic obstructive pulmonary disease, unspecified: Secondary | ICD-10-CM | POA: Diagnosis not present

## 2016-09-20 DIAGNOSIS — M545 Low back pain: Secondary | ICD-10-CM | POA: Diagnosis not present

## 2016-09-20 DIAGNOSIS — F418 Other specified anxiety disorders: Secondary | ICD-10-CM | POA: Diagnosis not present

## 2016-09-20 DIAGNOSIS — J449 Chronic obstructive pulmonary disease, unspecified: Secondary | ICD-10-CM | POA: Diagnosis not present

## 2016-09-20 DIAGNOSIS — G20 Parkinson's disease: Secondary | ICD-10-CM | POA: Diagnosis not present

## 2016-09-20 DIAGNOSIS — W19XXXS Unspecified fall, sequela: Secondary | ICD-10-CM | POA: Diagnosis not present

## 2016-09-20 DIAGNOSIS — R1311 Dysphagia, oral phase: Secondary | ICD-10-CM | POA: Diagnosis not present

## 2016-09-20 NOTE — Progress Notes (Signed)
[name] was seen today in the movement disorders clinic for neurologic consultation at the request of [name]..   The patient is accompanied by her daughter and granddaughter who supplements the history.  The patient is seen today in neurologic consultation for possible Parkinson's disease.  I have reviewed an extensive number of records made available to me.  The patient was hospitalized on December 8 after a fall 2 days prior to admission.  The fall occurred after the patient got up at night while attempting to use the bathroom.  She did apparently taken a Valium earlier that night.  This was just one of many falls that she had sustained.  Her family had questioned whether or not she had had Parkinson's disease and a neurologic consultation was obtained while she was in the [name].  The patient saw Dr. Leonel [name] and he felt that her symptoms were consistent with possible Parkinson's disease.  She was started on levodopa, 1 po tid and told to follow-up here.  Pt and family indicate that it has helped walking and tremor.  She is currently in skilled rehab for 30 days but is only in for short term and has only been there for 1 day.    The patient has previously seen [name] and last saw [name] on 03/15/2015.  She has seen Dr. Leonie [name] as well as several of his nurse practitioners.  I reviewed those records in detail.  She had a TIA on 01/03/2013 but there was Angel tremor when seen by [name] in July, 2014.  On her follow-up in January, 2015 notes indicate that "action tremor" had developed in the right hand and a resting tremor had developed in the right foot.  In February, 2016 notes indicate that the patient now had decreased arm swing and a mild resting tremor of the right hand and Mysoline was offered to the patient but she did not want that.  On 03/15/2015 the patient reported more falls and it was recommended that they just monitor the  tremor.  Angel Angel medications or therapy were given.  She reports fam hx of PD in maternal aunt.   09/19/15 update:  The patient presents today for follow-up, accompanied by her daughter who supplements the history.  I have reviewed prior records made available to me.  Last visit, her levodopa was increased to 1-1/2 tablets 3 times per day and it was recommended that she take these at 7 AM/11 AM/4 PM.  She did not do this and is still taking it tid and spreading out the last to take at bedtime.  States that she remembers when she was in the SNF she knew she was taking an extra 1/2 tablet of something but didn't know what.  She denies any falls since our last visit.  Angel hallucinations.  Angel lightheadedness or near syncope.  She did have a modified barium swallow on 08/05/2015.  This was normal.  It was noted that her respiratory status may allow for episodic aspiration due to discoordination of swallow/apneic period.  I did note from SNF records that the patient was on valium 2 mg prn for tremor.   Is still taking it at home but isn't sure how often but thinks one time per day. 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  She is accompanied by her  daughter and granddaughter who supplement the history.  Last visit, her levodopa was increased to 1-1/2 tablets 3 times per day and it was recommended that she take these at 7 AM/11 AM/4 PM. She actually takes these at 7:30/noon/and 6pm (1 hour after supper).   I talked extensively to her last visit about the fact that I did not like her living situation, which was living with her husband who had Alzheimer's disease, given the fact that the patient's own memory was impaired.  I have reviewed  records made available to me since last visit.  She was hospitalized from March 20 to March 23 with a urinary tract infection and discharged to a subacute nursing facility.   Reports that she is in still in assisted living and "that is my home now."  They help her at night with getting up and they cook the food.  She is able to get herself dressed.  She gets assistance with bathing.  She gets some PT a few times a week and otherwise is in the [name] much of the time.  She had a bedside swallow evaluation on March 21 recommended mechanical soft diet with thin liquids.  Angel falls.  Angel hallucinations.    05/24/16 update:  The patient presents today for follow-up, accompanied by her daughter who supplements the history.  She is on carbidopa/levodopa 25/100,  1-1/2 tablets 3 times a day.  She states that her biggest issue is tremor of the right foot.  It is better if she is given her medication on time, which she states is a challenge because she is given that that shift change in the morning.  She asks me to write her a prescription for marijuana.  She denies falls.  She denies lightheadedness or near syncope.  She continues to live at the assisted living facility.  She did follow up with vascular surgery and I reviewed those records.  She had a carotid ultrasound that did not demonstrate any significant stenosis.  This was done on 04/24/2016.  09/24/16 update:  Patient presents today for follow-up.  She is accompanied by her daughter who supplements the history.  Last visit, I increased her levodopa to 2 tablets 3 times per day.  She states that she doesn't know what she is taking and neither does her daughter.   She moved from [name] to [name] and really doesn't like it.  She moved because her husband needed memory care and she wanted to move with her husband.  Pt is in assisted living.  Angel med list accompanies her.   Pt has had 5 falls and all falls have occurred since she moved.  Pt denies lightheadedness, near syncope.  Angel hallucinations.  Mood has not been good as patient has been so anxious and upset re: living situation.  Had anxiety attack last night.   ALLERGIES:   Allergies    Allergen Reactions  . Relafen [Nabumetone] Diarrhea and Other (See Comments)    GI / Urinary Bleeding  . Lipitor [Atorvastatin] Other (See Comments)    Causes memory loss  . Other Other (See Comments)    "orvail" unknown:  Causes Bloody stool  . Penicillins Hives, Itching, Swelling and Rash    Has patient had a PCN reaction causing immediate rash, facial/tongue/throat swelling, SOB or lightheadedness with hypotension: Angel Has patient had a PCN reaction causing severe rash involving mucus membranes or skin necrosis: unknown Has patient had a PCN reaction that required hospitalization Angel Has patient had a PCN  reaction occurring within the last 10 years: Angel If all of the above answers are "Angel", then may proceed with Cephalosporin use.     CURRENT MEDICATIONS:  Outpatient Encounter Prescriptions as of 09/24/2016  Medication Sig  . acetaminophen (TYLENOL) 325 MG tablet Take 325 mg by mouth every 4 (four) hours as needed for fever.  Marland Kitchen ADVAIR HFA 230-21 MCG/ACT inhaler Inhale 2 puffs into the lungs 2 (two) times daily. (Patient taking differently: Inhale 2 puffs into the lungs daily. )  . albuterol (PROVENTIL HFA;VENTOLIN HFA) 108 (90 BASE) MCG/ACT inhaler Inhale 2 puffs into the lungs every 6 (six) hours as needed for wheezing or shortness of breath.  Marland Kitchen amLODipine (NORVASC) 5 MG tablet Take 1 tablet (5 mg total) by mouth daily. Must keep appt for future refills (Patient taking differently: Take 5 mg by mouth daily. )  . aspirin EC 81 MG tablet Take 1 tablet (81 mg total) by mouth daily. (Patient taking differently: Take 81 mg by mouth daily. Do not crush)  . baclofen (LIORESAL) 10 MG tablet Take 5 mg by mouth every 8 (eight) hours as needed (spasms).   . benzonatate (TESSALON) 100 MG capsule Take 100 mg by mouth 3 (three) times daily as needed for cough.   . bisacodyl (DULCOLAX) 5 MG EC tablet Take 1 tablet (5 mg total) by mouth daily as needed for moderate constipation.  . calcium-vitamin D  (OSCAL WITH D) 500-200 MG-UNIT TABS tablet Take 1 tablet by mouth 2 (two) times daily.  . carbidopa-levodopa (SINEMET IR) 25-100 MG tablet Take 2 tablets by mouth 3 (three) times daily. for parkinson's disease  . Cranberry 250 MG CAPS Take 250 mg by mouth 2 (two) times daily. For UTI prevention  . diazepam (VALIUM) 2 MG tablet 1/2 - 1 tab by mouth twice per day as needed (Patient taking differently: Take 2 mg by mouth 2 (two) times daily. )  . divalproex (DEPAKOTE SPRINKLE) 125 MG capsule Take 125 mg by mouth 2 (two) times daily. Do not crush - for anxiety and mood  . escitalopram (LEXAPRO) 10 MG tablet Take 1 tablet (10 mg total) by mouth daily. (Patient not taking: Reported on 07/09/2016)  . escitalopram (LEXAPRO) 20 MG tablet Take 20 mg by mouth daily.  . ferrous sulfate 325 (65 FE) MG tablet Take 325 mg by mouth daily with breakfast. Do not crush  . fish oil-omega-3 fatty acids 1000 MG capsule Take 1 g by mouth daily.   . fluticasone (FLONASE) 50 MCG/ACT nasal spray Place 2 sprays into both nostrils daily as needed for allergies or rhinitis.   Marland Kitchen guaifenesin (ROBAFEN) 100 MG/5ML syrup Take 300 mg by mouth 3 (three) times daily as needed for cough.   . levothyroxine (SYNTHROID) 88 MCG tablet Take 88 mcg by mouth daily with breakfast.   . meloxicam (MOBIC) 15 MG tablet Take 15 mg by mouth daily as needed for pain.   . Menthol, Topical Analgesic, (BIOFREEZE EX) Apply 1 application topically 4 (four) times daily as needed (pain).  . Nutritional Supplements (NUTRITIONAL SUPPLEMENT PO) Take 120 mLs by mouth 2 (two) times daily. MedPass  . omeprazole (PRILOSEC) 20 MG capsule Take 1 capsule (20 mg total) by mouth daily. (Patient taking differently: Take 20 mg by mouth daily. Do not crush)  . polyethylene glycol (MIRALAX / GLYCOLAX) packet Take 17 g by mouth daily as needed for mild constipation. (Patient taking differently: Take 17 g by mouth daily as needed for mild constipation. Mix in 8  oz water and  drink)  . pravastatin (PRAVACHOL) 20 MG tablet Take 1 tablet (20 mg total) by mouth daily at 6 PM.  . sucralfate (CARAFATE) 1 GM/10ML suspension Take 1 g by mouth every evening.  . traMADol (ULTRAM) 50 MG tablet Take 25 mg by mouth every 8 (eight) hours as needed (pain control).   . Zinc Oxide (AVEENO BABY SOOTHING RELIEF EX) Apply 1 application topically See admin instructions. Apply to bilateral buttocks and posterior thigh as needed for irritation   Angel facility-administered encounter medications on [name] as of 09/24/2016.     PAST MEDICAL HISTORY:   Past Medical History:  Diagnosis Date  . Acute cystitis   . Anemia   . Anxiety   . Aortic stenosis, mild 11/18/2013  . Asthma   . Back pain   . Bronchitis, acute   . Carotid artery occlusion   . Cerebrovascular disease   . COPD (chronic obstructive pulmonary disease) ([facility])   . Depression   . Diverticulosis of colon   . DJD (degenerative joint disease)   . Fall 07/05/2015  . GERD (gastroesophageal reflux disease)   . History of sudden visual loss   . Hyperlipidemia   . Hypertension   . Hypothyroidism   . Pneumonia 2015   hx of x 2   . Pulmonary hypertension, moderate to severe 11/19/2014  . Shortness of breath    with exertion   . 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 Transient ischemic attack   . UTI (lower urinary tract infection)    hx of     PAST SURGICAL HISTORY:   Past Surgical History:  Procedure Laterality Date  . CARDIAC CATHETERIZATION  02/18/14  . CARDIOTHORACIC PROCEDURE  01/13/2009   Right   . COLECTOMY    . ENDARTERECTOMY     right carotid endartarectomy - 2010  . FOOT SURGERY     nerve cut between toes   . LEFT AND RIGHT HEART CATHETERIZATION WITH CORONARY ANGIOGRAM N/A 02/18/2014   Procedure: LEFT AND RIGHT HEART CATHETERIZATION WITH CORONARY ANGIOGRAM;  Surgeon: Larey Dresser, MD;  Location: [facility] CATH LAB;  Service: Cardiovascular;  Laterality: N/A;  . REPLACEMENT TOTAL KNEE     right and  left knee  . TEE WITHOUT CARDIOVERSION N/A 02/08/2014   Procedure: TRANSESOPHAGEAL ECHOCARDIOGRAM (TEE);  Surgeon: Larey Dresser, MD;  Location: [place];  Service: Cardiovascular;  Laterality: N/A;  . TOTAL KNEE ARTHROPLASTY Left 11/17/2012   Procedure: TOTAL KNEE ARTHROPLASTY;  Surgeon: Gearlean Alf, MD;  Location: [facility];  Service: Orthopedics;  Laterality: Left;  . TUBAL LIGATION    . VAGINAL HYSTERECTOMY      SOCIAL HISTORY:   Social History   Social History  . Marital status: Married    Spouse name: Journalist, newspaper  . Number of children: 3  . Years of education: N/A   Occupational History  . Not on [name].   Social History Main Topics  . Smoking status: Never Smoker  . Smokeless tobacco: Never Used  . Alcohol use Angel  . Drug use: Angel  . Sexual activity: Not on [name]   Other Topics Concern  . Not on [name]   Social History Narrative  . Angel narrative on [name]    FAMILY HISTORY:   Family Status  Relation Status  . Father Deceased   Emphysema  . Mother Deceased   CHF  . Sister Alive   heart disease  . Brother Alive  COPD  . Sister Alive   COPD  . Sister   . Brother     ROS:  A complete 10 system review of systems was obtained and was unremarkable apart from what is mentioned above.  PHYSICAL EXAMINATION:    VITALS:   Vitals:   09/24/16 1338  BP: (!) 100/56  Pulse: 76  Weight: 142 lb (64.4 kg)  Height: 5\' 5"  (1.651 m)    GEN:  The patient appears stated age and is in NAD.  There is an unpleasant odor in the room. HEENT:  Normocephalic, atraumatic.  The mucous membranes are moist. The superficial temporal arteries are without ropiness or tenderness. CV:  RRR Lungs:  CTAB.  She is visibly short of breath.  This gets worse with exertion. Neck/HEME:  There are Angel carotid bruits bilaterally.  Neurological examination:  Orientation:  Montreal Cognitive Assessment  02/16/2016  Visuospatial/ Executive (0/5) 3  Naming (0/3) 3  Attention: Read list of digits  (0/2) 2  Attention: Read list of letters (0/1) 1  Attention: Serial 7 subtraction starting at 100 (0/3) 1  Language: Repeat phrase (0/2) 2  Language : Fluency (0/1) 1  Abstraction (0/2) 2  Delayed Recall (0/5) 2  Orientation (0/6) 6  Total 23  Adjusted Score (based on education) 24    Cranial nerves: There is good facial symmetry.  There is significant facial hypomimia.  The speech is fluent and clear.  She is hypophonic.  Soft palate rises symmetrically and there is Angel tongue deviation. Hearing is intact to conversational tone. Sensation: Sensation is intact to light touch throughout. Motor: Strength is 5/5 in the bilateral upper and lower extremities.   Shoulder shrug is equal and symmetric.  There is Angel pronator drift.   Movement examination: Tone: There is Angel rigidity today.   Abnormal movements: There is a mild right upper extremity resting tremor and rare LUE resting tremor Coordination:  There is Angel significant decremation today. Gait and Station: The patient given a walker and ambulates slowly but was able to do so and was more limited by SOB than PD  ASSESSMENT/PLAN:  1.  Idiopathic Parkinson's disease.  While this is just diagnosed in 06/2015, she has had documented sx's since at least 07/2013.    -continue carbidopa/levodopa 25/100, 2 po tid.  SNF records regarding meds don't make sense (one place says 2 po tid but marked off as giving qid, one place says giving an additional 2 carbidopa with each levodopa).    -PT/OT ordered today  -daughter to meet with them.  Likely needs more skilled care than independent.    -needs to use walker at all times if transferring and needs staff with her if going to walk.  Doesn't even have walker at facility so daughter to bring walker to facility 2.  Dysphagia  -She did have a modified barium swallow on 08/05/2015.  This was normal.  It was noted that her respiratory status may allow for episodic aspiration due to discoordination of  swallow/apneic period.  Has to drink out of straw or will choke and is complying with that 3.  Shortness of breath  -This is going to significantly limit her ability to participate with therapy but patient would like to restart 4  Anxiety  -back on valium and ativan both.  Daughter asked me about valium last visit.  It is certainly not my favorite anxiolytic and the patient is getting it twice a day.  However, it probably is not worth the  flight in this patient.  She is only on 2 mg twice a day of valium but wouldn't recommend the valium/ativan in combo  -going to write for psychiatry consult.  Pts anxiety and depression is worsening physical state  5.  Follow up is anticipated in the next 4 months, sooner should Angel neurologic issues arise.   Much greater than 50% of this visit was spent in counseling with the patient and the family.  Total face to face time:  30 min

## 2016-09-24 ENCOUNTER — Ambulatory Visit (INDEPENDENT_AMBULATORY_CARE_PROVIDER_SITE_OTHER): Payer: Medicare Other | Admitting: Neurology

## 2016-09-24 ENCOUNTER — Encounter: Payer: Self-pay | Admitting: Neurology

## 2016-09-24 VITALS — BP 100/56 | HR 76 | Ht 65.0 in | Wt 142.0 lb

## 2016-09-24 DIAGNOSIS — F418 Other specified anxiety disorders: Secondary | ICD-10-CM | POA: Diagnosis not present

## 2016-09-24 DIAGNOSIS — F321 Major depressive disorder, single episode, moderate: Secondary | ICD-10-CM | POA: Diagnosis not present

## 2016-09-24 DIAGNOSIS — M545 Low back pain: Secondary | ICD-10-CM | POA: Diagnosis not present

## 2016-09-24 DIAGNOSIS — R1311 Dysphagia, oral phase: Secondary | ICD-10-CM | POA: Diagnosis not present

## 2016-09-24 DIAGNOSIS — G20 Parkinson's disease: Secondary | ICD-10-CM

## 2016-09-24 DIAGNOSIS — W19XXXS Unspecified fall, sequela: Secondary | ICD-10-CM | POA: Diagnosis not present

## 2016-09-24 DIAGNOSIS — J449 Chronic obstructive pulmonary disease, unspecified: Secondary | ICD-10-CM | POA: Diagnosis not present

## 2016-09-24 NOTE — Patient Instructions (Addendum)
1. Start Physical and Occupational Therapy- order given.   2. Patient needs psychiatry consult.   3. Patient needs to use walker anytime she is transferring to help prevent falls.   4. Patient should be taking Carbidopa Levodopa 25/100 IR  2 tablets at 7:30 am 2 tablets at 11:30 am 2 tablets at 4:30 pm   5. Discontinue Carbidopa 25 mg (before meals.)

## 2016-09-25 DIAGNOSIS — E039 Hypothyroidism, unspecified: Secondary | ICD-10-CM | POA: Diagnosis not present

## 2016-09-25 DIAGNOSIS — I10 Essential (primary) hypertension: Secondary | ICD-10-CM | POA: Diagnosis not present

## 2016-09-25 DIAGNOSIS — R0602 Shortness of breath: Secondary | ICD-10-CM | POA: Diagnosis not present

## 2016-09-25 DIAGNOSIS — K219 Gastro-esophageal reflux disease without esophagitis: Secondary | ICD-10-CM | POA: Diagnosis not present

## 2016-09-25 DIAGNOSIS — J449 Chronic obstructive pulmonary disease, unspecified: Secondary | ICD-10-CM | POA: Diagnosis not present

## 2016-09-25 DIAGNOSIS — G20 Parkinson's disease: Secondary | ICD-10-CM | POA: Diagnosis not present

## 2016-09-25 DIAGNOSIS — R1319 Other dysphagia: Secondary | ICD-10-CM | POA: Diagnosis not present

## 2016-09-25 DIAGNOSIS — R269 Unspecified abnormalities of gait and mobility: Secondary | ICD-10-CM | POA: Diagnosis not present

## 2016-09-26 DIAGNOSIS — R0602 Shortness of breath: Secondary | ICD-10-CM | POA: Diagnosis not present

## 2016-09-27 DIAGNOSIS — W19XXXS Unspecified fall, sequela: Secondary | ICD-10-CM | POA: Diagnosis not present

## 2016-09-27 DIAGNOSIS — M545 Low back pain: Secondary | ICD-10-CM | POA: Diagnosis not present

## 2016-09-27 DIAGNOSIS — R1311 Dysphagia, oral phase: Secondary | ICD-10-CM | POA: Diagnosis not present

## 2016-09-27 DIAGNOSIS — G20 Parkinson's disease: Secondary | ICD-10-CM | POA: Diagnosis not present

## 2016-09-27 DIAGNOSIS — F418 Other specified anxiety disorders: Secondary | ICD-10-CM | POA: Diagnosis not present

## 2016-09-27 DIAGNOSIS — J449 Chronic obstructive pulmonary disease, unspecified: Secondary | ICD-10-CM | POA: Diagnosis not present

## 2016-10-01 DIAGNOSIS — J449 Chronic obstructive pulmonary disease, unspecified: Secondary | ICD-10-CM | POA: Diagnosis not present

## 2016-10-01 DIAGNOSIS — R1311 Dysphagia, oral phase: Secondary | ICD-10-CM | POA: Diagnosis not present

## 2016-10-01 DIAGNOSIS — M545 Low back pain: Secondary | ICD-10-CM | POA: Diagnosis not present

## 2016-10-01 DIAGNOSIS — F418 Other specified anxiety disorders: Secondary | ICD-10-CM | POA: Diagnosis not present

## 2016-10-01 DIAGNOSIS — G20 Parkinson's disease: Secondary | ICD-10-CM | POA: Diagnosis not present

## 2016-10-01 DIAGNOSIS — W19XXXS Unspecified fall, sequela: Secondary | ICD-10-CM | POA: Diagnosis not present

## 2016-10-02 DIAGNOSIS — Z79899 Other long term (current) drug therapy: Secondary | ICD-10-CM | POA: Diagnosis not present

## 2016-10-03 ENCOUNTER — Inpatient Hospital Stay (HOSPITAL_COMMUNITY)
Admission: EM | Admit: 2016-10-03 | Discharge: 2016-10-05 | DRG: 689 | Disposition: A | Discharge status: Home Health | Payer: Medicare Other | Attending: Internal Medicine | Admitting: Internal Medicine

## 2016-10-03 ENCOUNTER — Emergency Department (HOSPITAL_COMMUNITY): Payer: Medicare Other

## 2016-10-03 ENCOUNTER — Encounter (HOSPITAL_COMMUNITY): Payer: Self-pay | Admitting: Emergency Medicine

## 2016-10-03 DIAGNOSIS — R410 Disorientation, unspecified: Secondary | ICD-10-CM | POA: Diagnosis present

## 2016-10-03 DIAGNOSIS — Z7982 Long term (current) use of aspirin: Secondary | ICD-10-CM

## 2016-10-03 DIAGNOSIS — K5730 Diverticulosis of large intestine without perforation or abscess without bleeding: Secondary | ICD-10-CM | POA: Diagnosis present

## 2016-10-03 DIAGNOSIS — G9340 Encephalopathy, unspecified: Secondary | ICD-10-CM | POA: Diagnosis present

## 2016-10-03 DIAGNOSIS — Y92129 Unspecified place in nursing home as the place of occurrence of the external cause: Secondary | ICD-10-CM

## 2016-10-03 DIAGNOSIS — R3915 Urgency of urination: Secondary | ICD-10-CM | POA: Diagnosis present

## 2016-10-03 DIAGNOSIS — G20 Parkinson's disease: Secondary | ICD-10-CM | POA: Diagnosis present

## 2016-10-03 DIAGNOSIS — F329 Major depressive disorder, single episode, unspecified: Secondary | ICD-10-CM | POA: Diagnosis present

## 2016-10-03 DIAGNOSIS — Z9181 History of falling: Secondary | ICD-10-CM

## 2016-10-03 DIAGNOSIS — N390 Urinary tract infection, site not specified: Secondary | ICD-10-CM | POA: Diagnosis present

## 2016-10-03 DIAGNOSIS — E039 Hypothyroidism, unspecified: Secondary | ICD-10-CM | POA: Diagnosis present

## 2016-10-03 DIAGNOSIS — G309 Alzheimer's disease, unspecified: Secondary | ICD-10-CM | POA: Diagnosis not present

## 2016-10-03 DIAGNOSIS — J449 Chronic obstructive pulmonary disease, unspecified: Secondary | ICD-10-CM | POA: Diagnosis not present

## 2016-10-03 DIAGNOSIS — E785 Hyperlipidemia, unspecified: Secondary | ICD-10-CM | POA: Diagnosis present

## 2016-10-03 DIAGNOSIS — Z8249 Family history of ischemic heart disease and other diseases of the circulatory system: Secondary | ICD-10-CM

## 2016-10-03 DIAGNOSIS — W19XXXA Unspecified fall, initial encounter: Secondary | ICD-10-CM | POA: Diagnosis not present

## 2016-10-03 DIAGNOSIS — S0990XA Unspecified injury of head, initial encounter: Secondary | ICD-10-CM | POA: Diagnosis not present

## 2016-10-03 DIAGNOSIS — R51 Headache: Secondary | ICD-10-CM | POA: Diagnosis not present

## 2016-10-03 DIAGNOSIS — R079 Chest pain, unspecified: Secondary | ICD-10-CM | POA: Diagnosis not present

## 2016-10-03 DIAGNOSIS — Z8673 Personal history of transient ischemic attack (TIA), and cerebral infarction without residual deficits: Secondary | ICD-10-CM

## 2016-10-03 DIAGNOSIS — N3001 Acute cystitis with hematuria: Secondary | ICD-10-CM | POA: Diagnosis not present

## 2016-10-03 DIAGNOSIS — Z825 Family history of asthma and other chronic lower respiratory diseases: Secondary | ICD-10-CM

## 2016-10-03 DIAGNOSIS — Z888 Allergy status to other drugs, medicaments and biological substances status: Secondary | ICD-10-CM

## 2016-10-03 DIAGNOSIS — Z79891 Long term (current) use of opiate analgesic: Secondary | ICD-10-CM

## 2016-10-03 DIAGNOSIS — R4182 Altered mental status, unspecified: Secondary | ICD-10-CM | POA: Diagnosis not present

## 2016-10-03 DIAGNOSIS — S299XXA Unspecified injury of thorax, initial encounter: Secondary | ICD-10-CM | POA: Diagnosis not present

## 2016-10-03 DIAGNOSIS — K219 Gastro-esophageal reflux disease without esophagitis: Secondary | ICD-10-CM | POA: Diagnosis present

## 2016-10-03 DIAGNOSIS — I10 Essential (primary) hypertension: Secondary | ICD-10-CM | POA: Diagnosis present

## 2016-10-03 DIAGNOSIS — R350 Frequency of micturition: Secondary | ICD-10-CM | POA: Diagnosis present

## 2016-10-03 DIAGNOSIS — Z791 Long term (current) use of non-steroidal anti-inflammatories (NSAID): Secondary | ICD-10-CM

## 2016-10-03 DIAGNOSIS — Z96653 Presence of artificial knee joint, bilateral: Secondary | ICD-10-CM | POA: Diagnosis present

## 2016-10-03 DIAGNOSIS — Z79899 Other long term (current) drug therapy: Secondary | ICD-10-CM

## 2016-10-03 DIAGNOSIS — Z9049 Acquired absence of other specified parts of digestive tract: Secondary | ICD-10-CM

## 2016-10-03 DIAGNOSIS — Z880 Allergy status to penicillin: Secondary | ICD-10-CM

## 2016-10-03 DIAGNOSIS — Z87440 Personal history of urinary (tract) infections: Secondary | ICD-10-CM

## 2016-10-03 DIAGNOSIS — F419 Anxiety disorder, unspecified: Secondary | ICD-10-CM | POA: Diagnosis present

## 2016-10-03 DIAGNOSIS — S199XXA Unspecified injury of neck, initial encounter: Secondary | ICD-10-CM | POA: Diagnosis not present

## 2016-10-03 DIAGNOSIS — W06XXXA Fall from bed, initial encounter: Secondary | ICD-10-CM | POA: Diagnosis present

## 2016-10-03 DIAGNOSIS — R296 Repeated falls: Secondary | ICD-10-CM | POA: Diagnosis present

## 2016-10-03 DIAGNOSIS — H5462 Unqualified visual loss, left eye, normal vision right eye: Secondary | ICD-10-CM | POA: Diagnosis present

## 2016-10-03 DIAGNOSIS — R319 Hematuria, unspecified: Secondary | ICD-10-CM | POA: Diagnosis present

## 2016-10-03 DIAGNOSIS — F0280 Dementia in other diseases classified elsewhere without behavioral disturbance: Secondary | ICD-10-CM | POA: Diagnosis not present

## 2016-10-03 DIAGNOSIS — Z7951 Long term (current) use of inhaled steroids: Secondary | ICD-10-CM

## 2016-10-03 LAB — URINALYSIS, ROUTINE W REFLEX MICROSCOPIC
BILIRUBIN URINE: NEGATIVE
Glucose, UA: NEGATIVE mg/dL
KETONES UR: NEGATIVE mg/dL
Nitrite: NEGATIVE
PH: 6 (ref 5.0–8.0)
Protein, ur: NEGATIVE mg/dL
SPECIFIC GRAVITY, URINE: 1.006 (ref 1.005–1.030)

## 2016-10-03 LAB — CBC WITH DIFFERENTIAL/PLATELET
BASOS ABS: 0 10*3/uL (ref 0.0–0.1)
Basophils Relative: 1 %
EOS ABS: 0.2 10*3/uL (ref 0.0–0.7)
EOS PCT: 4 %
HCT: 32.7 % — ABNORMAL LOW (ref 36.0–46.0)
Hemoglobin: 10.7 g/dL — ABNORMAL LOW (ref 12.0–15.0)
Lymphocytes Relative: 43 %
Lymphs Abs: 2.4 10*3/uL (ref 0.7–4.0)
MCH: 32.2 pg (ref 26.0–34.0)
MCHC: 32.7 g/dL (ref 30.0–36.0)
MCV: 98.5 fL (ref 78.0–100.0)
MONO ABS: 0.7 10*3/uL (ref 0.1–1.0)
Monocytes Relative: 12 %
Neutro Abs: 2.2 10*3/uL (ref 1.7–7.7)
Neutrophils Relative %: 40 %
PLATELETS: 204 10*3/uL (ref 150–400)
RBC: 3.32 MIL/uL — AB (ref 3.87–5.11)
RDW: 13.3 % (ref 11.5–15.5)
WBC: 5.4 10*3/uL (ref 4.0–10.5)

## 2016-10-03 LAB — COMPREHENSIVE METABOLIC PANEL
AST: 16 U/L (ref 15–41)
Albumin: 3.4 g/dL — ABNORMAL LOW (ref 3.5–5.0)
Alkaline Phosphatase: 36 U/L — ABNORMAL LOW (ref 38–126)
Anion gap: 7 (ref 5–15)
BUN: 12 mg/dL (ref 6–20)
CHLORIDE: 100 mmol/L — AB (ref 101–111)
CO2: 32 mmol/L (ref 22–32)
CREATININE: 1.15 mg/dL — AB (ref 0.44–1.00)
Calcium: 9.3 mg/dL (ref 8.9–10.3)
GFR, EST AFRICAN AMERICAN: 51 mL/min — AB (ref >60)
GFR, EST NON AFRICAN AMERICAN: 44 mL/min — AB (ref >60)
Glucose, Bld: 94 mg/dL (ref 65–99)
Potassium: 3.7 mmol/L (ref 3.5–5.1)
SODIUM: 139 mmol/L (ref 135–145)
Total Bilirubin: 0.7 mg/dL (ref 0.3–1.2)
Total Protein: 6.4 g/dL — ABNORMAL LOW (ref 6.5–8.1)

## 2016-10-03 LAB — I-STAT CG4 LACTIC ACID, ED
LACTIC ACID, VENOUS: 0.66 mmol/L (ref 0.5–1.9)
LACTIC ACID, VENOUS: 0.84 mmol/L (ref 0.5–1.9)

## 2016-10-03 LAB — POC URINE PREG, ED: Preg Test, Ur: NEGATIVE

## 2016-10-03 MED ORDER — DEXTROSE 5 % IV SOLN
1.0000 g | Freq: Once | INTRAVENOUS | Status: AC
  Administered 2016-10-04: 1 g via INTRAVENOUS
  Filled 2016-10-03: qty 10

## 2016-10-03 MED ORDER — TRAMADOL HCL 50 MG PO TABS
50.0000 mg | ORAL_TABLET | Freq: Once | ORAL | Status: AC
  Administered 2016-10-03: 50 mg via ORAL
  Filled 2016-10-03: qty 1

## 2016-10-03 NOTE — ED Triage Notes (Signed)
Per [facility], pt from [facility], after a fall last night (10/02/16). Today pt c/o left sided headache. Per staff at facility, pt not acting her normal and is more drowsy than usual. A&O x 4. BP-122/60, HR-82, SpO2-91% room air

## 2016-10-03 NOTE — ED Provider Notes (Signed)
[facility] DEPT Provider Note   CSN: [id number] Arrival date & time: 10/03/16  1945     History   Chief Complaint Chief Complaint  Patient presents with  . Fall    HPI Hansini Clodfelter is a 82 y.o. female.  HPI   82 year old female with history of dementia, COPD, recurrent UTIs, who presents with increasing confusion. According to the nursing facility, the patient has had increasingly frequent falls over the last several days, along with increasing confusion. The patient has been noted to be less active and alert than usual throughout the day today. She was subsequent sent for evaluation. Currently, the patient complains of headache as well as bilateral shoulder pain. She also complains of urinary frequency and urgency. No fevers or chills. She reports a poor appetite.  Level 5 caveat invoked as remainder of history, ROS, and physical exam limited due to patient's dementia.   Past Medical History:  Diagnosis Date  . Acute cystitis   . Anemia   . Anxiety   . Aortic stenosis, mild 11/18/2013  . Asthma   . Back pain   . Bronchitis, acute   . Carotid artery occlusion   . Cerebrovascular disease   . COPD (chronic obstructive pulmonary disease) ([place])   . Depression   . Diverticulosis of colon   . DJD (degenerative joint disease)   . Fall 07/05/2015  . GERD (gastroesophageal reflux disease)   . History of sudden visual loss   . Hyperlipidemia   . Hypertension   . Hypothyroidism   . Pneumonia 2015   hx of x 2   . Pulmonary hypertension, moderate to severe 11/19/2014  . Shortness of breath    with exertion   . 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 Transient ischemic attack   . UTI (lower urinary tract infection)    hx of     Patient Active Problem List   Diagnosis Date Noted  . UTI (lower urinary tract infection) 10/17/2015  . Dementia in Alzheimer's disease 10/17/2015  . Encephalopathy, metabolic 27/25/3664  . Contusion, hip 07/07/2015  . Falls  frequently 07/07/2015  . Cervical radiculopathy 07/07/2015  . Anxiety and depression 07/07/2015  . Gait disturbance 07/07/2015  . Hyperglycemia 07/07/2015  . FTT (failure to thrive) in adult 07/07/2015  . Parkinson disease ([place])   . UTI (urinary tract infection) 07/05/2015  . Chronic neck pain 07/05/2015  . Debility 07/05/2015  . Fatigue 01/19/2015  . Dysuria 01/19/2015  . Chronic restrictive lung disease 01/04/2015  . DOE (dyspnea on exertion) 11/19/2014  . Pulmonary hypertension, moderate to severe 11/19/2014  . Irregular heart beats 08/12/2014  . Aftercare following surgery of the circulatory system, [place] 04/19/2014  . Pain of left lower extremity-Knee 04/19/2014  . Weakness 02/11/2014  . Loss of weight 02/11/2014  . Valvular heart disease 01/19/2014  . Pulmonary hypertension 01/19/2014  . CAP (community acquired pneumonia) 01/08/2014  . Asthma with acute exacerbation 01/06/2014  . Fall 01/06/2014  . Aortic stenosis, mild 11/18/2013  . Preventative health care 11/18/2013  . Anemia, unspecified 11/18/2013  . Tremor 09/21/2013  . Dyspnea 05/18/2013  . Physical deconditioning 05/18/2013  . OA (osteoarthritis) of knee 11/17/2012  . Hyperlipidemia 11/04/2012  . Occlusion and stenosis of carotid artery without mention of cerebral infarction 03/06/2012  . ASTHMA 10/09/2010  . Sudden visual loss 01/10/2009  . TRANSIENT ISCHEMIC ATTACK 01/10/2009  . CEREBROVASCULAR DISEASE 11/19/2007  . BRONCHITIS, RECURRENT 11/19/2007  . DIVERTICULOSIS OF COLON  11/19/2007  . Hypothyroidism 08/15/2007  . Anxiety state 08/15/2007  . Depression 08/15/2007  . Essential hypertension 08/15/2007  . GERD 08/15/2007  . Osteoarthritis 08/15/2007  . BACK PAIN, LUMBAR 08/15/2007    Past Surgical History:  Procedure Laterality Date  . CARDIAC CATHETERIZATION  02/18/14  . CARDIOTHORACIC PROCEDURE  01/13/2009   Right   . COLECTOMY    . ENDARTERECTOMY     right carotid endartarectomy - 2010  . FOOT  SURGERY     nerve cut between toes   . LEFT AND RIGHT HEART CATHETERIZATION WITH CORONARY ANGIOGRAM N/A 02/18/2014   Procedure: LEFT AND RIGHT HEART CATHETERIZATION WITH CORONARY ANGIOGRAM;  Surgeon: Larey Dresser, MD;  Location[facility] CATH LAB;  Service: Cardiovascular;  Laterality: N/A;  . REPLACEMENT TOTAL KNEE     right and left knee  . TEE WITHOUT CARDIOVERSION N/A 02/08/2014   Procedure: TRANSESOPHAGEAL ECHOCARDIOGRAM (TEE);  Surgeon: Larey Dresser, MD;  Location: [facility];  Service: Cardiovascular;  Laterality: N/A;  . TOTAL KNEE ARTHROPLASTY Left 11/17/2012   Procedure: TOTAL KNEE ARTHROPLASTY;  Surgeon: Gearlean Alf, MD;  Location: [facility] ORS;  Service: Orthopedics;  Laterality: Left;  . TUBAL LIGATION    . VAGINAL HYSTERECTOMY      OB History    No data available       Home Medications    Prior to Admission medications   Medication Sig Start Date End Date Taking? Authorizing Provider  ADVAIR HFA 230-21 MCG/ACT inhaler Inhale 2 puffs into the lungs 2 (two) times daily. Patient taking differently: Inhale 2 puffs into the lungs daily.  11/23/14  Yes Biagio Borg, MD  amLODipine (NORVASC) 5 MG tablet Take 1 tablet (5 mg total) by mouth daily. Must keep appt for future refills Patient taking differently: Take 5 mg by mouth daily.  10/03/15  Yes Biagio Borg, MD  aspirin 81 MG chewable tablet Chew 81 mg by mouth daily.   Yes Historical Provider, MD  baclofen (LIORESAL) 10 MG tablet Take 5 mg by mouth every 8 (eight) hours as needed (spasms).  10/06/15  Yes Historical Provider, MD  benzonatate (TESSALON) 100 MG capsule Take 100 mg by mouth 3 (three) times daily as needed for cough.    Yes Historical Provider, MD  bisacodyl (DULCOLAX) 5 MG EC tablet Take 1 tablet (5 mg total) by mouth daily as needed for moderate constipation. 10/20/15  Yes Annita Brod, MD  calcium carbonate (OS-CAL - DOSED IN MG OF ELEMENTAL CALCIUM) 1250 (500 Ca) MG tablet Take 2 tablets by mouth every 12  (twelve) hours as needed (for indigestion).   Yes Historical Provider, MD  carbidopa-levodopa (SINEMET IR) 25-100 MG tablet Take 2 tablets by mouth 3 (three) times daily. for parkinson's disease   Yes Historical Provider, MD  Cranberry 250 MG CAPS Take 250 mg by mouth 2 (two) times daily. For UTI prevention   Yes Historical Provider, MD  diazepam (VALIUM) 2 MG tablet 1/2 - 1 tab by mouth twice per day as needed Patient taking differently: Take 2 mg by mouth 2 (two) times daily.  10/20/15  Yes Annita Brod, MD  divalproex (DEPAKOTE SPRINKLE) 125 MG capsule Take 125 mg by mouth 2 (two) times daily. Do not crush - for anxiety and mood   Yes Historical Provider, MD  escitalopram (LEXAPRO) 20 MG tablet Take 20 mg by mouth daily.   Yes Historical Provider, MD  ferrous sulfate 325 (65 FE) MG tablet Take 325 mg by  mouth daily with breakfast. Do not crush   Yes Historical Provider, MD  fish oil-omega-3 fatty acids 1000 MG capsule Take 1 g by mouth daily.    Yes Historical Provider, MD  fluticasone (FLONASE) 50 MCG/ACT nasal spray Place 2 sprays into both nostrils daily as needed for allergies or rhinitis.    Yes Historical Provider, MD  guaifenesin (ROBAFEN) 100 MG/5ML syrup Take 300 mg by mouth 3 (three) times daily as needed for cough.    Yes Historical Provider, MD  Infant Care Products (DERMACLOUD) CREA Apply topically daily as needed (for buttocks).   Yes Historical Provider, MD  levothyroxine (SYNTHROID) 88 MCG tablet Take 88 mcg by mouth daily with breakfast.    Yes Historical Provider, MD  LORazepam (ATIVAN) 0.5 MG tablet Take 0.5 mg by mouth every 12 (twelve) hours as needed for anxiety.    Yes Historical Provider, MD  meloxicam (MOBIC) 15 MG tablet Take 15 mg by mouth daily as needed for pain.    Yes Historical Provider, MD  Menthol, Topical Analgesic, (BIOFREEZE) 4 % GEL Apply topically every 6 (six) hours as needed (for pain).   Yes Historical Provider, MD  omeprazole (PRILOSEC) 20 MG capsule  Take 1 capsule (20 mg total) by mouth daily. 08/12/14  Yes Biagio Borg, MD  Oyster Shell 500 MG TABS Take 500 mg by mouth 2 (two) times daily.    Yes Historical Provider, MD  pravastatin (PRAVACHOL) 20 MG tablet Take 1 tablet (20 mg total) by mouth daily at 6 PM. Patient taking differently: Take 20 mg by mouth every evening.  07/09/15  Yes Kelvin Cellar, MD  sucralfate (CARAFATE) 1 g tablet Take 1 g by mouth every evening.   Yes Historical Provider, MD  traMADol (ULTRAM) 50 MG tablet Take 50 mg by mouth every 6 (six) hours as needed for moderate pain.    Yes Historical Provider, MD    Family History Family History  Problem Relation Age of Onset  . Emphysema Father   . Heart disease Mother   . Hyperlipidemia Mother   . Hypertension Mother   . Coronary artery disease Sister   . Heart disease Sister   . Hypertension Sister   . Varicose Veins Sister   . AAA (abdominal aortic aneurysm) Sister   . Rheumatic fever Brother     x2  . Heart attack Brother     Social History Social History  Substance Use Topics  . Smoking status: Never Smoker  . Smokeless tobacco: Never Used  . Alcohol use No     Allergies   Relafen [nabumetone]; Lipitor [atorvastatin]; Other; and Penicillins   Review of Systems Review of Systems  Unable to perform ROS: Dementia  Constitutional: Positive for fatigue. Negative for chills and fever.  Genitourinary: Positive for dysuria and frequency.  Neurological: Positive for weakness and headaches.     Physical Exam Updated Vital Signs BP (!) 104/53   Pulse 60   Temp 97.9 F (36.6 C) (Oral)   Resp 17   Ht 5\' 5"  (1.651 m)   SpO2 93%   Physical Exam  Constitutional: She appears well-developed and well-nourished. No distress.  HENT:  Head: Normocephalic and atraumatic.  Mouth/Throat: Oropharynx is clear and moist.  Eyes: Conjunctivae are normal.  Neck: Normal range of motion. Neck supple.  Cardiovascular: Normal rate, regular rhythm and normal  heart sounds.  Exam reveals no friction rub.   No murmur heard. Pulmonary/Chest: Effort normal and breath sounds normal. No respiratory distress. She  has no wheezes. She has no rales.  Abdominal: Soft. Bowel sounds are normal. She exhibits no distension.  Musculoskeletal: She exhibits no edema.  Moderate diffuse tenderness across anterior chest and bilateral shoulders without bruising or deformity  Neurological: She is alert. She exhibits normal muscle tone.  Oriented to person only. Moves all extremities with 5 out of 5 strength. Face is symmetric.  Skin: Skin is warm. Capillary refill takes less than 2 seconds.  Psychiatric: She has a normal mood and affect.  Nursing note and vitals reviewed.    ED Treatments / Results  Labs (all labs ordered are listed, but only abnormal results are displayed) Labs Reviewed  CBC WITH DIFFERENTIAL/PLATELET - Abnormal; Notable for the following:       Result Value   RBC 3.32 (*)    Hemoglobin 10.7 (*)    HCT 32.7 (*)    All other components within normal limits  COMPREHENSIVE METABOLIC PANEL - Abnormal; Notable for the following:    Chloride 100 (*)    Creatinine, Ser 1.15 (*)    Total Protein 6.4 (*)    Albumin 3.4 (*)    ALT <5 (*)    Alkaline Phosphatase 36 (*)    GFR calc non Af Amer 44 (*)    GFR calc Af Amer 51 (*)    All other components within normal limits  URINALYSIS, ROUTINE W REFLEX MICROSCOPIC - Abnormal; Notable for the following:    APPearance HAZY (*)    Hgb urine dipstick SMALL (*)    Leukocytes, UA LARGE (*)    Bacteria, UA RARE (*)    Squamous Epithelial / LPF 0-5 (*)    Non Squamous Epithelial 0-5 (*)    All other components within normal limits  URINE CULTURE  I-STAT CG4 LACTIC ACID, ED  POC URINE PREG, ED  I-STAT CG4 LACTIC ACID, ED  I-STAT CG4 LACTIC ACID, ED    EKG  EKG Interpretation None       Radiology Dg Chest 2 View  Result Date: 10/03/2016 CLINICAL DATA:  Pain after fall EXAM: CHEST  2 VIEW  COMPARISON:  07/09/2016 FINDINGS: The heart size and mediastinal contours are within normal limits for size with aortic atherosclerosis of the arch. Mild emphysematous hyperinflation of the lungs with left basilar scarring. No pneumonic consolidation, effusion or pneumothorax. Mild osteoarthritis of the AC and glenohumeral joints bilaterally. No acute rib fracture or pneumothorax. Both lungs are clear. The visualized skeletal structures are unremarkable. IMPRESSION: Mild emphysematous hyperinflation of the lungs with left basilar scarring. No pneumothorax. No acute osseous abnormality. Aortic atherosclerosis. Electronically Signed   By: Ashley Royalty M.D.   On: 10/03/2016 21:15   Ct Head Wo Contrast  Result Date: 10/03/2016 CLINICAL DATA:  Left-sided headache after fall last evening EXAM: CT HEAD WITHOUT CONTRAST CT CERVICAL SPINE WITHOUT CONTRAST TECHNIQUE: Multidetector CT imaging of the head and cervical spine was performed following the standard protocol without intravenous contrast. Multiplanar CT image reconstructions of the cervical spine were also generated. COMPARISON:  CT head 07/09/2016 FINDINGS: CT HEAD FINDINGS BRAIN: There is sulcal and ventricular prominence consistent with superficial and central atrophy. No intraparenchymal hemorrhage, mass effect nor midline shift. Stable mild-to-moderate periventricular white matter hypodensities consistent with chronic small vessel ischemic disease. No acute large vascular territory infarcts. No abnormal extra-axial fluid collections. Basal cisterns are not effaced and midline. VASCULAR: Mild to moderate calcific atherosclerosis of the carotid siphons. SKULL: No skull fracture. No significant scalp soft tissue swelling. SINUSES/ORBITS: The  mastoid air-cells are clear. The included paranasal sinuses are well-aerated.The included ocular globes and orbital contents are non-suspicious. OTHER: None. CT CERVICAL SPINE FINDINGS Alignment: Straightening of cervical  lordosis with mild reversal at C3-4 without significant change. Grade 1 C2-3 anterolisthesis is also stable. Skull base and vertebrae: No acute fracture. No primary bone lesion or focal pathologic process. Soft tissues and spinal canal: No prevertebral fluid or swelling. No visible canal hematoma. Disc levels: C3 through C6 degenerative disc disease with bilateral uncovertebral joint spurring and dorsal osteophytes consistent with cervical spondylosis. Mild-to-moderate central canal osseous stenosis at C4-5. Upper chest: Pleuroparenchymal scarring at each lung apex. Other: Atherosclerotic calcifications of both extracranial carotids. IMPRESSION: 1. No acute intracranial abnormality noted. Stable mild-to-moderate microvascular ischemic disease and atrophy. 2. Cervical spondylosis without acute fracture most prominent from C3 through C6. Electronically Signed   By: Ashley Royalty M.D.   On: 10/03/2016 21:14   Ct Cervical Spine Wo Contrast  Result Date: 10/03/2016 CLINICAL DATA:  Left-sided headache after fall last evening EXAM: CT HEAD WITHOUT CONTRAST CT CERVICAL SPINE WITHOUT CONTRAST TECHNIQUE: Multidetector CT imaging of the head and cervical spine was performed following the standard protocol without intravenous contrast. Multiplanar CT image reconstructions of the cervical spine were also generated. COMPARISON:  CT head 07/09/2016 FINDINGS: CT HEAD FINDINGS BRAIN: There is sulcal and ventricular prominence consistent with superficial and central atrophy. No intraparenchymal hemorrhage, mass effect nor midline shift. Stable mild-to-moderate periventricular white matter hypodensities consistent with chronic small vessel ischemic disease. No acute large vascular territory infarcts. No abnormal extra-axial fluid collections. Basal cisterns are not effaced and midline. VASCULAR: Mild to moderate calcific atherosclerosis of the carotid siphons. SKULL: No skull fracture. No significant scalp soft tissue swelling.  SINUSES/ORBITS: The mastoid air-cells are clear. The included paranasal sinuses are well-aerated.The included ocular globes and orbital contents are non-suspicious. OTHER: None. CT CERVICAL SPINE FINDINGS Alignment: Straightening of cervical lordosis with mild reversal at C3-4 without significant change. Grade 1 C2-3 anterolisthesis is also stable. Skull base and vertebrae: No acute fracture. No primary bone lesion or focal pathologic process. Soft tissues and spinal canal: No prevertebral fluid or swelling. No visible canal hematoma. Disc levels: C3 through C6 degenerative disc disease with bilateral uncovertebral joint spurring and dorsal osteophytes consistent with cervical spondylosis. Mild-to-moderate central canal osseous stenosis at C4-5. Upper chest: Pleuroparenchymal scarring at each lung apex. Other: Atherosclerotic calcifications of both extracranial carotids. IMPRESSION: 1. No acute intracranial abnormality noted. Stable mild-to-moderate microvascular ischemic disease and atrophy. 2. Cervical spondylosis without acute fracture most prominent from C3 through C6. Electronically Signed   By: Ashley Royalty M.D.   On: 10/03/2016 21:14    Procedures Procedures (including critical care time)  Medications Ordered in ED Medications  fluticasone furoate-vilanterol (BREO ELLIPTA) 200-25 MCG/INH 1 puff (not administered)  amLODipine (NORVASC) tablet 5 mg (not administered)  bisacodyl (DULCOLAX) EC tablet 5 mg (not administered)  benzonatate (TESSALON) capsule 100 mg (not administered)  baclofen (LIORESAL) tablet 5 mg (not administered)  aspirin chewable tablet 81 mg (not administered)  calcium carbonate (OS-CAL - dosed in mg of elemental calcium) tablet 1,000 mg of elemental calcium (not administered)  carbidopa-levodopa (SINEMET IR) 25-100 MG per tablet immediate release 2 tablet (not administered)  diazepam (VALIUM) tablet 2 mg (not administered)  divalproex (DEPAKOTE SPRINKLE) capsule 125 mg (not  administered)  escitalopram (LEXAPRO) tablet 20 mg (not administered)  ferrous sulfate tablet 325 mg (not administered)  fluticasone (FLONASE) 50 MCG/ACT nasal spray 2 spray (not administered)  guaifenesin (ROBITUSSIN) 100 MG/5ML syrup 300 mg (not administered)  DERMACLOUD CREA (not administered)  pravastatin (PRAVACHOL) tablet 20 mg (not administered)  sucralfate (CARAFATE) tablet 1 g (not administered)  traMADol (ULTRAM) tablet 50 mg (not administered)  LORazepam (ATIVAN) tablet 0.5 mg (not administered)  levothyroxine (SYNTHROID, LEVOTHROID) tablet 88 mcg (not administered)  meloxicam (MOBIC) tablet 15 mg (not administered)  pantoprazole (PROTONIX) EC tablet 40 mg (not administered)  enoxaparin (LOVENOX) injection 40 mg (not administered)  cefTRIAXone (ROCEPHIN) 1 g in dextrose 5 % 50 mL IVPB (not administered)  traMADol (ULTRAM) tablet 50 mg (50 mg Oral Given 10/03/16 2203)  cefTRIAXone (ROCEPHIN) 1 g in dextrose 5 % 50 mL IVPB (0 g Intravenous Stopped 10/04/16 0040)     Initial Impression / Assessment and Plan / ED Course  I have reviewed the triage vital signs and the nursing notes.  Pertinent labs & imaging results that were available during my care of the patient were reviewed by me and considered in my medical decision making (see chart for details).     82 year old female with past medical history of dementia, hypertension, COPD, and multiple comorbidities as above who presents with acute encephalopathy, likely secondary to UTI. On my assessment, the patient does appear confused and is frequently trying to get out of bed, posing a high falls risk. Labwork shows overall reassuring white count and lactate, but urinalysis shows overt evidence of UTI. Given her encephalopathy associated with this, will admit for IV antibiotics, as she remains a very high fall risk and reportedly is having difficulty taking by mouth (according to facility, although RN I spoke with only sees pt at night,  but was reportedly told pt confused).  Final Clinical Impressions(s) / ED Diagnoses   Final diagnoses:  Acute cystitis with hematuria  Complicated UTI (urinary tract infection)    New Prescriptions New Prescriptions   No medications on file     Duffy Bruce, MD 10/04/16 [phone]

## 2016-10-04 ENCOUNTER — Encounter (HOSPITAL_COMMUNITY): Payer: Self-pay | Admitting: *Deleted

## 2016-10-04 ENCOUNTER — Telehealth: Payer: Self-pay | Admitting: Neurology

## 2016-10-04 DIAGNOSIS — Z23 Encounter for immunization: Secondary | ICD-10-CM | POA: Diagnosis not present

## 2016-10-04 DIAGNOSIS — Z825 Family history of asthma and other chronic lower respiratory diseases: Secondary | ICD-10-CM | POA: Diagnosis not present

## 2016-10-04 DIAGNOSIS — R296 Repeated falls: Secondary | ICD-10-CM | POA: Diagnosis present

## 2016-10-04 DIAGNOSIS — M25569 Pain in unspecified knee: Secondary | ICD-10-CM | POA: Diagnosis not present

## 2016-10-04 DIAGNOSIS — E785 Hyperlipidemia, unspecified: Secondary | ICD-10-CM | POA: Diagnosis present

## 2016-10-04 DIAGNOSIS — S199XXA Unspecified injury of neck, initial encounter: Secondary | ICD-10-CM | POA: Diagnosis not present

## 2016-10-04 DIAGNOSIS — W19XXXA Unspecified fall, initial encounter: Secondary | ICD-10-CM | POA: Diagnosis not present

## 2016-10-04 DIAGNOSIS — G309 Alzheimer's disease, unspecified: Secondary | ICD-10-CM | POA: Diagnosis not present

## 2016-10-04 DIAGNOSIS — K219 Gastro-esophageal reflux disease without esophagitis: Secondary | ICD-10-CM | POA: Diagnosis present

## 2016-10-04 DIAGNOSIS — Y92129 Unspecified place in nursing home as the place of occurrence of the external cause: Secondary | ICD-10-CM | POA: Diagnosis not present

## 2016-10-04 DIAGNOSIS — I621 Nontraumatic extradural hemorrhage: Secondary | ICD-10-CM | POA: Diagnosis not present

## 2016-10-04 DIAGNOSIS — H5462 Unqualified visual loss, left eye, normal vision right eye: Secondary | ICD-10-CM | POA: Diagnosis present

## 2016-10-04 DIAGNOSIS — Y999 Unspecified external cause status: Secondary | ICD-10-CM | POA: Diagnosis not present

## 2016-10-04 DIAGNOSIS — Y9389 Activity, other specified: Secondary | ICD-10-CM | POA: Diagnosis not present

## 2016-10-04 DIAGNOSIS — Z8673 Personal history of transient ischemic attack (TIA), and cerebral infarction without residual deficits: Secondary | ICD-10-CM | POA: Diagnosis not present

## 2016-10-04 DIAGNOSIS — I10 Essential (primary) hypertension: Secondary | ICD-10-CM | POA: Diagnosis not present

## 2016-10-04 DIAGNOSIS — R410 Disorientation, unspecified: Secondary | ICD-10-CM | POA: Diagnosis present

## 2016-10-04 DIAGNOSIS — J449 Chronic obstructive pulmonary disease, unspecified: Secondary | ICD-10-CM | POA: Diagnosis present

## 2016-10-04 DIAGNOSIS — Z79899 Other long term (current) drug therapy: Secondary | ICD-10-CM | POA: Diagnosis not present

## 2016-10-04 DIAGNOSIS — Y92002 Bathroom of unspecified non-institutional (private) residence single-family (private) house as the place of occurrence of the external cause: Secondary | ICD-10-CM | POA: Diagnosis not present

## 2016-10-04 DIAGNOSIS — M79662 Pain in left lower leg: Secondary | ICD-10-CM | POA: Diagnosis not present

## 2016-10-04 DIAGNOSIS — G9340 Encephalopathy, unspecified: Secondary | ICD-10-CM | POA: Diagnosis present

## 2016-10-04 DIAGNOSIS — S0083XA Contusion of other part of head, initial encounter: Secondary | ICD-10-CM | POA: Diagnosis not present

## 2016-10-04 DIAGNOSIS — S0990XA Unspecified injury of head, initial encounter: Secondary | ICD-10-CM | POA: Diagnosis present

## 2016-10-04 DIAGNOSIS — S8992XA Unspecified injury of left lower leg, initial encounter: Secondary | ICD-10-CM | POA: Diagnosis not present

## 2016-10-04 DIAGNOSIS — N390 Urinary tract infection, site not specified: Principal | ICD-10-CM

## 2016-10-04 DIAGNOSIS — E039 Hypothyroidism, unspecified: Secondary | ICD-10-CM | POA: Diagnosis present

## 2016-10-04 DIAGNOSIS — S0993XA Unspecified injury of face, initial encounter: Secondary | ICD-10-CM | POA: Diagnosis not present

## 2016-10-04 DIAGNOSIS — Z9181 History of falling: Secondary | ICD-10-CM | POA: Diagnosis not present

## 2016-10-04 DIAGNOSIS — Z888 Allergy status to other drugs, medicaments and biological substances status: Secondary | ICD-10-CM | POA: Diagnosis not present

## 2016-10-04 DIAGNOSIS — S01112A Laceration without foreign body of left eyelid and periocular area, initial encounter: Secondary | ICD-10-CM | POA: Diagnosis not present

## 2016-10-04 DIAGNOSIS — F0280 Dementia in other diseases classified elsewhere without behavioral disturbance: Secondary | ICD-10-CM

## 2016-10-04 DIAGNOSIS — F329 Major depressive disorder, single episode, unspecified: Secondary | ICD-10-CM | POA: Diagnosis present

## 2016-10-04 DIAGNOSIS — S8002XA Contusion of left knee, initial encounter: Secondary | ICD-10-CM | POA: Diagnosis not present

## 2016-10-04 DIAGNOSIS — R319 Hematuria, unspecified: Secondary | ICD-10-CM | POA: Diagnosis present

## 2016-10-04 DIAGNOSIS — W1809XA Striking against other object with subsequent fall, initial encounter: Secondary | ICD-10-CM | POA: Diagnosis not present

## 2016-10-04 DIAGNOSIS — Z8249 Family history of ischemic heart disease and other diseases of the circulatory system: Secondary | ICD-10-CM | POA: Diagnosis not present

## 2016-10-04 DIAGNOSIS — F419 Anxiety disorder, unspecified: Secondary | ICD-10-CM | POA: Diagnosis present

## 2016-10-04 DIAGNOSIS — Z7982 Long term (current) use of aspirin: Secondary | ICD-10-CM | POA: Diagnosis not present

## 2016-10-04 DIAGNOSIS — J45909 Unspecified asthma, uncomplicated: Secondary | ICD-10-CM | POA: Diagnosis not present

## 2016-10-04 DIAGNOSIS — Z87440 Personal history of urinary (tract) infections: Secondary | ICD-10-CM | POA: Diagnosis not present

## 2016-10-04 DIAGNOSIS — S5011XA Contusion of right forearm, initial encounter: Secondary | ICD-10-CM | POA: Diagnosis not present

## 2016-10-04 DIAGNOSIS — G20 Parkinson's disease: Secondary | ICD-10-CM | POA: Diagnosis present

## 2016-10-04 DIAGNOSIS — Z880 Allergy status to penicillin: Secondary | ICD-10-CM | POA: Diagnosis not present

## 2016-10-04 DIAGNOSIS — W06XXXA Fall from bed, initial encounter: Secondary | ICD-10-CM | POA: Diagnosis present

## 2016-10-04 DIAGNOSIS — N3001 Acute cystitis with hematuria: Secondary | ICD-10-CM | POA: Diagnosis not present

## 2016-10-04 MED ORDER — FERROUS SULFATE 325 (65 FE) MG PO TABS
325.0000 mg | ORAL_TABLET | Freq: Every day | ORAL | Status: DC
  Administered 2016-10-04 – 2016-10-05 (×2): 325 mg via ORAL
  Filled 2016-10-04 (×2): qty 1

## 2016-10-04 MED ORDER — DIVALPROEX SODIUM 125 MG PO CSDR
125.0000 mg | DELAYED_RELEASE_CAPSULE | Freq: Two times a day (BID) | ORAL | Status: DC
  Administered 2016-10-04 – 2016-10-05 (×3): 125 mg via ORAL
  Filled 2016-10-04 (×5): qty 1

## 2016-10-04 MED ORDER — LEVOTHYROXINE SODIUM 88 MCG PO TABS
88.0000 ug | ORAL_TABLET | Freq: Every day | ORAL | Status: DC
  Administered 2016-10-04 – 2016-10-05 (×2): 88 ug via ORAL
  Filled 2016-10-04 (×2): qty 1

## 2016-10-04 MED ORDER — BACLOFEN 5 MG HALF TABLET
5.0000 mg | ORAL_TABLET | Freq: Three times a day (TID) | ORAL | Status: DC | PRN
  Filled 2016-10-04: qty 1

## 2016-10-04 MED ORDER — ENOXAPARIN SODIUM 40 MG/0.4ML ~~LOC~~ SOLN
40.0000 mg | Freq: Every day | SUBCUTANEOUS | Status: DC
  Administered 2016-10-04 – 2016-10-05 (×2): 40 mg via SUBCUTANEOUS
  Filled 2016-10-04 (×2): qty 0.4

## 2016-10-04 MED ORDER — BISACODYL 5 MG PO TBEC: 5.0000 mg | DELAYED_RELEASE_TABLET | Freq: Every day | ORAL | Status: DC | PRN

## 2016-10-04 MED ORDER — CARBIDOPA-LEVODOPA 25-100 MG PO TABS
2.0000 | ORAL_TABLET | Freq: Three times a day (TID) | ORAL | Status: DC
  Administered 2016-10-04 – 2016-10-05 (×4): 2 via ORAL
  Filled 2016-10-04: qty 2
  Filled 2016-10-04: qty 1
  Filled 2016-10-04 (×3): qty 2

## 2016-10-04 MED ORDER — AMLODIPINE BESYLATE 5 MG PO TABS
5.0000 mg | ORAL_TABLET | Freq: Every day | ORAL | Status: DC
  Administered 2016-10-05: 5 mg via ORAL
  Filled 2016-10-04 (×2): qty 1

## 2016-10-04 MED ORDER — FLUTICASONE FUROATE-VILANTEROL 200-25 MCG/INH IN AEPB
1.0000 | INHALATION_SPRAY | Freq: Every day | RESPIRATORY_TRACT | Status: DC
  Filled 2016-10-04: qty 28

## 2016-10-04 MED ORDER — CALCIUM CARBONATE 1250 (500 CA) MG PO TABS: 2.0000 | ORAL_TABLET | Freq: Two times a day (BID) | ORAL | Status: DC | PRN

## 2016-10-04 MED ORDER — GUAIFENESIN 100 MG/5ML PO SOLN: 300.0000 mg | Freq: Three times a day (TID) | ORAL | Status: DC | PRN

## 2016-10-04 MED ORDER — MELOXICAM 15 MG PO TABS
15.0000 mg | ORAL_TABLET | Freq: Every day | ORAL | Status: DC | PRN
  Filled 2016-10-04: qty 1

## 2016-10-04 MED ORDER — SUCRALFATE 1 G PO TABS
1.0000 g | ORAL_TABLET | Freq: Every evening | ORAL | Status: DC
  Administered 2016-10-04: 1 g via ORAL
  Filled 2016-10-04: qty 1

## 2016-10-04 MED ORDER — ASPIRIN 81 MG PO CHEW
81.0000 mg | CHEWABLE_TABLET | Freq: Every day | ORAL | Status: DC
  Administered 2016-10-04 – 2016-10-05 (×2): 81 mg via ORAL
  Filled 2016-10-04 (×2): qty 1

## 2016-10-04 MED ORDER — PANTOPRAZOLE SODIUM 40 MG PO TBEC
40.0000 mg | DELAYED_RELEASE_TABLET | Freq: Every day | ORAL | Status: DC
  Administered 2016-10-04 – 2016-10-05 (×2): 40 mg via ORAL
  Filled 2016-10-04 (×2): qty 1

## 2016-10-04 MED ORDER — TRAMADOL HCL 50 MG PO TABS
50.0000 mg | ORAL_TABLET | Freq: Four times a day (QID) | ORAL | Status: DC | PRN
Start: 2016-10-04 — End: 2016-10-05
  Administered 2016-10-04 – 2016-10-05 (×3): 50 mg via ORAL
  Filled 2016-10-04 (×3): qty 1

## 2016-10-04 MED ORDER — BENZONATATE 100 MG PO CAPS: 100.0000 mg | ORAL_CAPSULE | Freq: Three times a day (TID) | ORAL | Status: DC | PRN

## 2016-10-04 MED ORDER — DEXTROSE 5 % IV SOLN
1.0000 g | INTRAVENOUS | Status: DC
  Administered 2016-10-04: 1 g via INTRAVENOUS
  Filled 2016-10-04: qty 10

## 2016-10-04 MED ORDER — PRAVASTATIN SODIUM 40 MG PO TABS
20.0000 mg | ORAL_TABLET | Freq: Every evening | ORAL | Status: DC
  Administered 2016-10-04: 20 mg via ORAL
  Filled 2016-10-04: qty 1

## 2016-10-04 MED ORDER — DERMACLOUD EX CREA: TOPICAL_CREAM | Freq: Every day | CUTANEOUS | Status: DC | PRN

## 2016-10-04 MED ORDER — ESCITALOPRAM OXALATE 10 MG PO TABS
20.0000 mg | ORAL_TABLET | Freq: Every day | ORAL | Status: DC
  Administered 2016-10-04 – 2016-10-05 (×2): 20 mg via ORAL
  Filled 2016-10-04 (×2): qty 2

## 2016-10-04 MED ORDER — FLUTICASONE PROPIONATE 50 MCG/ACT NA SUSP: 2.0000 | Freq: Every day | NASAL | Status: DC | PRN

## 2016-10-04 MED ORDER — LORAZEPAM 0.5 MG PO TABS
0.5000 mg | ORAL_TABLET | Freq: Two times a day (BID) | ORAL | Status: DC | PRN
  Administered 2016-10-05: 0.5 mg via ORAL
  Filled 2016-10-04: qty 1

## 2016-10-04 MED ORDER — DIAZEPAM 2 MG PO TABS
2.0000 mg | ORAL_TABLET | Freq: Two times a day (BID) | ORAL | Status: DC
  Administered 2016-10-04 – 2016-10-05 (×3): 2 mg via ORAL
  Filled 2016-10-04 (×3): qty 1

## 2016-10-04 NOTE — Telephone Encounter (Signed)
Made patient's daughter aware that skilled nursing is usually temporary and assisted living is probably more what she needs because it is a permanent arrangement and they help with bathing, etc. She will discuss with Education officer, museum.

## 2016-10-04 NOTE — Clinical Social Work Placement (Signed)
   CLINICAL SOCIAL WORK PLACEMENT  NOTE  Date:  10/04/2016  Patient Details  Name: Karesha Trzcinski MRN: [id number] Date of Birth: 13-Mar-1935  Clinical Social Work is seeking post-discharge placement for this patient at the [place] level of care (*CSW will initial, date and re-position this form in  chart as items are completed):      Patient/family provided with [facility] Work Department's list of facilities offering this level of care within the geographic area requested by the patient (or if unable, by the patient's family).      Patient/family informed of their freedom to choose among providers that offer the needed level of care, that participate in Medicare, Medicaid or managed care program needed by the patient, have an available bed and are willing to accept the patient.      Patient/family informed of [facility]'s ownership interest in [facility] and [facility], as well as of the fact that they are under no obligation to receive care at these facilities.  PASRR submitted to EDS on       PASRR number received on 10/04/16     Existing PASRR number confirmed on       FL2 transmitted to all facilities in geographic area requested by pt/family on 10/04/16     FL2 transmitted to all facilities within larger geographic area on       Patient informed that his/her managed care company has contracts with or will negotiate with certain facilities, including the following:        Yes   Patient/family informed of bed offers received.  Patient chooses bed at  [facility])     Physician recommends and patient chooses bed at      Patient to be transferred to  [facility]) on  .  Patient to be transferred to facility by       Patient family notified on   of transfer.  Name of family member notified:        PHYSICIAN Please prepare priority discharge summary, including medications, Please  prepare prescriptions, Please sign FL2     Additional Comment:    _______________________________________________ Alla German, LCSW 10/04/2016, 11:47 AM

## 2016-10-04 NOTE — Progress Notes (Signed)
[facility] PROGRESS NOTE  [name] BJY:[id number] DOB: 08/09/1934 DOA: 10/03/2016  PCP: No PCP Per Patient  Brief History/Interval Summary: 82 year old female with a past medical history of Parkinson's disease, Alzheimer's dementia, recurrent UTI, presented from skilled nursing facility after sustaining a fall. She was not found to have any injuries, however, was found to have an abnormal UA. She did mention symptoms of urinary frequency and urgency. She was also noted to be more confused than her baseline. She was hospitalized for further management.  Reason for Visit: Urinary tract infection with acute encephalopathy  Consultants: None  Procedures: None  Antibiotics: Ceftriaxone  Subjective/Interval History: Patient somewhat confused this morning. She however denies any pain or discomfort. No nausea or vomiting.  ROS: Denies any chest pain or shortness of breath.  Objective:  Vital Signs  Vitals:   10/04/16 0245 10/04/16 0315 10/04/16 0413 10/04/16 0946  BP: (!) 102/52 (!) 109/50 (!) 114/46 (!) 101/46  Pulse: (!) 59 (!) 58 (!) 59   Resp: 17 16 16    Temp:   98.3 F (36.8 C)   TempSrc:   Oral   SpO2: 94% 95% 95%   Height:        Intake/Output Summary (Last 24 hours) at 10/04/16 1126 Last data filed at 10/04/16 0900  Gross per 24 hour  Intake              290 ml  Output                0 ml  Net              290 ml   There were no vitals filed for this visit.  General appearance: alert, cooperative, appears stated age, distracted and no distress Resp: clear to auscultation bilaterally Cardio: regular rate and rhythm, S1, S2 normal, no murmur, click, rub or gallop GI: soft, non-tender; bowel sounds normal; no masses,  no organomegaly Extremities: extremities normal, atraumatic, no cyanosis or edema Neurologic: Awake and alert. Oriented to place. Distracted. No focal neurological deficits.  Lab Results:  Data Reviewed: I have personally reviewed  following labs and imaging studies  CBC:  Recent Labs Lab 10/03/16 2136  WBC 5.4  NEUTROABS 2.2  HGB 10.7*  HCT 32.7*  MCV 98.5  PLT 086    Basic Metabolic Panel:  Recent Labs Lab 10/03/16 2136  NA 139  K 3.7  CL 100*  CO2 32  GLUCOSE 94  BUN 12  CREATININE 1.15*  CALCIUM 9.3    GFR: Estimated Creatinine Clearance: 35.7 mL/min (by C-G formula based on SCr of 1.15 mg/dL (H)).  Liver Function Tests:  Recent Labs Lab 10/03/16 2136  AST 16  ALT <5*  ALKPHOS 36*  BILITOT 0.7  PROT 6.4*  ALBUMIN 3.4*    Radiology Studies: Dg Chest 2 View  Result Date: 10/03/2016 CLINICAL DATA:  Pain after fall EXAM: CHEST  2 VIEW COMPARISON:  07/09/2016 FINDINGS: The heart size and mediastinal contours are within normal limits for size with aortic atherosclerosis of the arch. Mild emphysematous hyperinflation of the lungs with left basilar scarring. No pneumonic consolidation, effusion or pneumothorax. Mild osteoarthritis of the AC and glenohumeral joints bilaterally. No acute rib fracture or pneumothorax. Both lungs are clear. The visualized skeletal structures are unremarkable. IMPRESSION: Mild emphysematous hyperinflation of the lungs with left basilar scarring. No pneumothorax. No acute osseous abnormality. Aortic atherosclerosis. Electronically Signed   By: Ashley Royalty M.D.   On: 10/03/2016 21:15   Ct  Head Wo Contrast  Result Date: 10/03/2016 CLINICAL DATA:  Left-sided headache after fall last evening EXAM: CT HEAD WITHOUT CONTRAST CT CERVICAL SPINE WITHOUT CONTRAST TECHNIQUE: Multidetector CT imaging of the head and cervical spine was performed following the standard protocol without intravenous contrast. Multiplanar CT image reconstructions of the cervical spine were also generated. COMPARISON:  CT head 07/09/2016 FINDINGS: CT HEAD FINDINGS BRAIN: There is sulcal and ventricular prominence consistent with superficial and central atrophy. No intraparenchymal hemorrhage, mass effect  nor midline shift. Stable mild-to-moderate periventricular white matter hypodensities consistent with chronic small vessel ischemic disease. No acute large vascular territory infarcts. No abnormal extra-axial fluid collections. Basal cisterns are not effaced and midline. VASCULAR: Mild to moderate calcific atherosclerosis of the carotid siphons. SKULL: No skull fracture. No significant scalp soft tissue swelling. SINUSES/ORBITS: The mastoid air-cells are clear. The included paranasal sinuses are well-aerated.The included ocular globes and orbital contents are non-suspicious. OTHER: None. CT CERVICAL SPINE FINDINGS Alignment: Straightening of cervical lordosis with mild reversal at C3-4 without significant change. Grade 1 C2-3 anterolisthesis is also stable. Skull base and vertebrae: No acute fracture. No primary bone lesion or focal pathologic process. Soft tissues and spinal canal: No prevertebral fluid or swelling. No visible canal hematoma. Disc levels: C3 through C6 degenerative disc disease with bilateral uncovertebral joint spurring and dorsal osteophytes consistent with cervical spondylosis. Mild-to-moderate central canal osseous stenosis at C4-5. Upper chest: Pleuroparenchymal scarring at each lung apex. Other: Atherosclerotic calcifications of both extracranial carotids. IMPRESSION: 1. No acute intracranial abnormality noted. Stable mild-to-moderate microvascular ischemic disease and atrophy. 2. Cervical spondylosis without acute fracture most prominent from C3 through C6. Electronically Signed   By: Ashley Royalty M.D.   On: 10/03/2016 21:14   Ct Cervical Spine Wo Contrast  Result Date: 10/03/2016 CLINICAL DATA:  Left-sided headache after fall last evening EXAM: CT HEAD WITHOUT CONTRAST CT CERVICAL SPINE WITHOUT CONTRAST TECHNIQUE: Multidetector CT imaging of the head and cervical spine was performed following the standard protocol without intravenous contrast. Multiplanar CT image reconstructions of the  cervical spine were also generated. COMPARISON:  CT head 07/09/2016 FINDINGS: CT HEAD FINDINGS BRAIN: There is sulcal and ventricular prominence consistent with superficial and central atrophy. No intraparenchymal hemorrhage, mass effect nor midline shift. Stable mild-to-moderate periventricular white matter hypodensities consistent with chronic small vessel ischemic disease. No acute large vascular territory infarcts. No abnormal extra-axial fluid collections. Basal cisterns are not effaced and midline. VASCULAR: Mild to moderate calcific atherosclerosis of the carotid siphons. SKULL: No skull fracture. No significant scalp soft tissue swelling. SINUSES/ORBITS: The mastoid air-cells are clear. The included paranasal sinuses are well-aerated.The included ocular globes and orbital contents are non-suspicious. OTHER: None. CT CERVICAL SPINE FINDINGS Alignment: Straightening of cervical lordosis with mild reversal at C3-4 without significant change. Grade 1 C2-3 anterolisthesis is also stable. Skull base and vertebrae: No acute fracture. No primary bone lesion or focal pathologic process. Soft tissues and spinal canal: No prevertebral fluid or swelling. No visible canal hematoma. Disc levels: C3 through C6 degenerative disc disease with bilateral uncovertebral joint spurring and dorsal osteophytes consistent with cervical spondylosis. Mild-to-moderate central canal osseous stenosis at C4-5. Upper chest: Pleuroparenchymal scarring at each lung apex. Other: Atherosclerotic calcifications of both extracranial carotids. IMPRESSION: 1. No acute intracranial abnormality noted. Stable mild-to-moderate microvascular ischemic disease and atrophy. 2. Cervical spondylosis without acute fracture most prominent from C3 through C6. Electronically Signed   By: Ashley Royalty M.D.   On: 10/03/2016 21:14     Medications:  Scheduled: . amLODipine  5 mg Oral Daily  . aspirin  81 mg Oral Daily  . carbidopa-levodopa  2 tablet Oral TID   . cefTRIAXone (ROCEPHIN)  IV  1 g Intravenous Q24H  . diazepam  2 mg Oral BID  . divalproex  125 mg Oral BID  . enoxaparin (LOVENOX) injection  40 mg Subcutaneous Daily  . escitalopram  20 mg Oral Daily  . ferrous sulfate  325 mg Oral Q breakfast  . fluticasone furoate-vilanterol  1 puff Inhalation Daily  . levothyroxine  88 mcg Oral Q breakfast  . pantoprazole  40 mg Oral Daily  . pravastatin  20 mg Oral QPM  . sucralfate  1 g Oral QPM   Continuous:  YME:BRAXENMM, benzonatate, bisacodyl, calcium carbonate, fluticasone, guaiFENesin, LORazepam, meloxicam, traMADol  Assessment/Plan:  Principal Problem:   UTI (urinary tract infection) Active Problems:   Essential hypertension   Dementia in Alzheimer's disease    Urinary tract infection with acute encephalopathy. Patient mental status appears to be getting back to baseline. Continue treating infection with antibiotics. Follow up on urine cultures.  History of essential hypertension. Continue with home medications. Monitor blood pressures closely.  History of dementia. Continue with home medications.  History of Parkinson's disease. Stable. Continue Sinemet.  History of hypothyroidism. Continue with her levothyroxin   DVT Prophylaxis: Lovenox    Code Status: Full code  Family Communication: Discussed with patient. Discussed with her daughter as well.  Disposition Plan: Continue management as above.    LOS: 0 days   [facility] Pager [phone] 10/04/2016, 11:26 AM  If 7PM-7AM, please contact night-coverage at [email or web address], password [facility]

## 2016-10-04 NOTE — Telephone Encounter (Signed)
[name] 2035-02-26.Her mom has a UTI and she should be released tomorrow. She was wanting to have her mom transferred from the hospital to Skilled nursing care and now [facility] is saying she is needing assisted  Living instead. Her daughter Julian Hy # [phone] (work) [phone] or cell [phone]. Thank you

## 2016-10-04 NOTE — Evaluation (Signed)
Physical Therapy Evaluation Patient Details Name: [name] MRN: [id number] DOB: October 24, 1934 Today's Date: 10/04/2016   History of Present Illness  Pt is a 82 yo female admitted through ED following a fall at her ALF on 10/03/16. Pt was diagnosed with a UTI. PMH significant for Parkinson's, Alzheimers, frequent UTI, acute cystitis, Anemia, Anxiety, aortic stenosis, Asthma, COPD, Bronchitis, DJD.   Clinical Impression  Pt presents with the above diagnosis and below deficits for therapy evaluation. Per pt, prior to admission she was WC bound for mobility. Pt is able to perform sit to stand and stand pivot transfers with Min A to min guard assist. Pt will benefit from continued acute PT services in order to maximize her functional outcomes.     Follow Up Recommendations Home health PT;Supervision for mobility/OOB    Equipment Recommendations  None recommended by PT    Recommendations for Other Services       Precautions / Restrictions Precautions Precautions: Fall Precaution Comments: fall lead to recent hospitalization Restrictions Weight Bearing Restrictions: No      Mobility  Bed Mobility Overal bed mobility: Needs Assistance Bed Mobility: Supine to Sit;Sit to Supine     Supine to sit: Min guard Sit to supine: Min guard   General bed mobility comments: Min guard for safety with use of railings into and out of bed  Transfers Overall transfer level: Needs assistance Equipment used: 1 person hand held assist Transfers: Sit to/from [facility] Sit to Stand: Min assist Stand pivot transfers: Min assist       General transfer comment: Min A for safety from EOB to [facility] and to recliner.   Ambulation/Gait                Stairs            Wheelchair Mobility    Modified Rankin (Stroke Patients Only)       Balance Overall balance assessment: Needs assistance Sitting-balance support: Bilateral upper extremity supported;Feet  supported Sitting balance-Leahy Scale: Fair     Standing balance support: Bilateral upper extremity supported Standing balance-Leahy Scale: Poor Standing balance comment: Relies on two hand support for stability                             Pertinent Vitals/Pain Pain Assessment: No/denies pain    Home Living Family/patient expects to be discharged to:: Assisted living               Home Equipment: Wheelchair - power Additional Comments: Was living in a memory care of ALF at [place].     Prior Function Level of Independence: Needs assistance   Gait / Transfers Assistance Needed: was ambulatory in a WC per pt  ADL's / Homemaking Assistance Needed: living in ALF  Comments: No CG present to verify information     Hand Dominance   Dominant Hand: Right    Extremity/Trunk Assessment   Upper Extremity Assessment Upper Extremity Assessment: Overall WFL for tasks assessed    Lower Extremity Assessment Lower Extremity Assessment: Overall WFL for tasks assessed       Communication   Communication: No difficulties  Cognition Arousal/Alertness: Awake/alert Behavior During Therapy: WFL for tasks assessed/performed Overall Cognitive Status: No family/caregiver present to determine baseline cognitive functioning                      General Comments      Exercises  Assessment/Plan    PT Assessment Patient needs continued PT services  PT Problem List Decreased strength;Decreased activity tolerance;Decreased balance;Decreased mobility;Decreased knowledge of use of DME       PT Treatment Interventions DME instruction;Functional mobility training;Therapeutic activities;Therapeutic exercise;Balance training    PT Goals (Current goals can be found in the Care Plan section)  Acute Rehab PT Goals Patient Stated Goal: none stated PT Goal Formulation: Patient unable to participate in goal setting Time For Goal Achievement: 10/11/16 Potential to  Achieve Goals: Good    Frequency Min 3X/week   Barriers to discharge        Co-evaluation               End of Session Equipment Utilized During Treatment: Gait belt Activity Tolerance: Patient tolerated treatment well Patient left: in chair;with call bell/phone within reach;with chair alarm set Nurse Communication: Mobility status PT Visit Diagnosis: Unsteadiness on feet (R26.81);History of falling (Z91.81)    Functional Assessment Tool Used: AM-PAC 6 Clicks Basic Mobility;Clinical judgement Functional Limitation: Mobility: Walking and moving around Mobility: Walking and Moving Around Current Status (S0630): At least 40 percent but less than 60 percent impaired, limited or restricted Mobility: Walking and Moving Around Goal Status [phone]): At least 20 percent but less than 40 percent impaired, limited or restricted    Time: 1110-1127 PT Time Calculation (min) (ACUTE ONLY): 17 min   Charges:   PT Evaluation $PT Eval Moderate Complexity: 1 Procedure     PT G Codes:   PT G-Codes **NOT FOR INPATIENT CLASS** Functional Assessment Tool Used: AM-PAC 6 Clicks Basic Mobility;Clinical judgement Functional Limitation: Mobility: Walking and moving around Mobility: Walking and Moving Around Current Status (X3235): At least 40 percent but less than 60 percent impaired, limited or restricted Mobility: Walking and Moving Around Goal Status [phone]): At least 20 percent but less than 40 percent impaired, limited or restricted     Scheryl Marten PT, DPT  [phone]  10/04/2016, 12:47 PM

## 2016-10-04 NOTE — Clinical Social Work Note (Signed)
CSW spoke with pt's daughter via telephone. Pt's daughter was requesting that her mom be sent to SNF. However, CSW explained to pt's daughter that PT is recommending that pt get PT in the ALF she is currently at. CSW explained the difference between SNF and ALF and emphasized on how SNF is short term. Pt's daughter was explaining that pt complains about every facility she is in and that pt's daughter thought getting her to a SNF would help. Again, CSW explained that SNF is short term and after pt would return to the ALF. CSW suggested that pt's daughter search for her mom a different ALF. At this time PT is recommending hh PT at ALF and pts daughter is understanding.   [address], [place]

## 2016-10-04 NOTE — ED Notes (Addendum)
Pt resting comfortably, rise and fall of chest noted.

## 2016-10-04 NOTE — Telephone Encounter (Signed)
Spoke with daughter Leda Gauze and let her know this would need to be discussed with social work at the hospital since she is an inpatient.  Dr. Carles Collet does have it recommended in her last office note and they have access to that. They will call if they need anything further.

## 2016-10-04 NOTE — H&P (Signed)
History and Physical    [name] NAT:[id number] DOB: 01/04/1935 DOA: 10/03/2016   PCP: No PCP Per Patient Chief Complaint:  Chief Complaint  Patient presents with  . Fall    HPI: [name] is a 82 y.o. female with medical history significant of parkinson's disease, alzheimer's dementia, recurrent UTIs.  Patient sent in to ED from [facility] (rehab SNF) after a fall last evening on 10/02/16.  Today patient with L sided headache, more drowsy than usual.  She states she has urinary frequency and urgency.  No fevers nor chills, poor appetitive.  ED Course: Patient is very unsurprised when we inform her that the conclusion of our work up is that she has a UTI ("I knew it, I get those all the time").  Review of Systems: As per HPI otherwise 10 point review of systems negative.    Past Medical History:  Diagnosis Date  . Acute cystitis   . Anemia   . Anxiety   . Aortic stenosis, mild 11/18/2013  . Asthma   . Back pain   . Bronchitis, acute   . Carotid artery occlusion   . Cerebrovascular disease   . COPD (chronic obstructive pulmonary disease) ([place])   . Depression   . Diverticulosis of colon   . DJD (degenerative joint disease)   . Fall 07/05/2015  . GERD (gastroesophageal reflux disease)   . History of sudden visual loss   . Hyperlipidemia   . Hypertension   . Hypothyroidism   . Pneumonia 2015   hx of x 2   . Pulmonary hypertension, moderate to severe 11/19/2014  . Shortness of breath    with exertion   . 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 Transient ischemic attack   . UTI (lower urinary tract infection)    hx of     Past Surgical History:  Procedure Laterality Date  . CARDIAC CATHETERIZATION  02/18/14  . CARDIOTHORACIC PROCEDURE  01/13/2009   Right   . COLECTOMY    . ENDARTERECTOMY     right carotid endartarectomy - 2010  . FOOT SURGERY     nerve cut between toes   . LEFT AND RIGHT HEART CATHETERIZATION WITH CORONARY ANGIOGRAM N/A  02/18/2014   Procedure: LEFT AND RIGHT HEART CATHETERIZATION WITH CORONARY ANGIOGRAM;  Surgeon: Larey Dresser, MD;  Location: [facility] CATH LAB;  Service: Cardiovascular;  Laterality: N/A;  . REPLACEMENT TOTAL KNEE     right and left knee  . TEE WITHOUT CARDIOVERSION N/A 02/08/2014   Procedure: TRANSESOPHAGEAL ECHOCARDIOGRAM (TEE);  Surgeon: Larey Dresser, MD;  Location: [place];  Service: Cardiovascular;  Laterality: N/A;  . TOTAL KNEE ARTHROPLASTY Left 11/17/2012   Procedure: TOTAL KNEE ARTHROPLASTY;  Surgeon: Gearlean Alf, MD;  Location: [facility] OR[facility];  Service: Orthopedics;  Laterality: Left;  . TUBAL LIGATION    . VAGINAL HYSTERECTOMY       reports that she has never smoked. She has never used smokeless tobacco. She reports that she does not drink alcohol or use drugs.  Allergies  Allergen Reactions  . Relafen [Nabumetone] Diarrhea and Other (See Comments)    GI / Urinary Bleeding  . Lipitor [Atorvastatin] Other (See Comments)    Causes memory loss  . Other Other (See Comments)    "orvail" unknown:  Causes Bloody stool  . Penicillins Hives, Itching, Swelling and Rash    Has patient had a PCN reaction causing immediate rash, facial/tongue/throat swelling, SOB or  lightheadedness with hypotension: no Has patient had a PCN reaction causing severe rash involving mucus membranes or skin necrosis: unknown Has patient had a PCN reaction that required hospitalization no Has patient had a PCN reaction occurring within the last 10 years: no If all of the above answers are "NO", then may proceed with Cephalosporin use.     Family History  Problem Relation Age of Onset  . Emphysema Father   . Heart disease Mother   . Hyperlipidemia Mother   . Hypertension Mother   . Coronary artery disease Sister   . Heart disease Sister   . Hypertension Sister   . Varicose Veins Sister   . AAA (abdominal aortic aneurysm) Sister   . Rheumatic fever Brother     x2  . Heart attack Brother        Prior to Admission medications   Medication Sig Start Date End Date Taking? Authorizing Provider  ADVAIR HFA 230-21 MCG/ACT inhaler Inhale 2 puffs into the lungs 2 (two) times daily. Patient taking differently: Inhale 2 puffs into the lungs daily.  11/23/14  Yes Biagio Borg, MD  amLODipine (NORVASC) 5 MG tablet Take 1 tablet (5 mg total) by mouth daily. Must keep appt for future refills Patient taking differently: Take 5 mg by mouth daily.  10/03/15  Yes Biagio Borg, MD  aspirin 81 MG chewable tablet Chew 81 mg by mouth daily.   Yes Historical Provider, MD  baclofen (LIORESAL) 10 MG tablet Take 5 mg by mouth every 8 (eight) hours as needed (spasms).  10/06/15  Yes Historical Provider, MD  benzonatate (TESSALON) 100 MG capsule Take 100 mg by mouth 3 (three) times daily as needed for cough.    Yes Historical Provider, MD  bisacodyl (DULCOLAX) 5 MG EC tablet Take 1 tablet (5 mg total) by mouth daily as needed for moderate constipation. 10/20/15  Yes Annita Brod, MD  calcium carbonate (OS-CAL - DOSED IN MG OF ELEMENTAL CALCIUM) 1250 (500 Ca) MG tablet Take 2 tablets by mouth every 12 (twelve) hours as needed (for indigestion).   Yes Historical Provider, MD  carbidopa-levodopa (SINEMET IR) 25-100 MG tablet Take 2 tablets by mouth 3 (three) times daily. for parkinson's disease   Yes Historical Provider, MD  Cranberry 250 MG CAPS Take 250 mg by mouth 2 (two) times daily. For UTI prevention   Yes Historical Provider, MD  diazepam (VALIUM) 2 MG tablet 1/2 - 1 tab by mouth twice per day as needed Patient taking differently: Take 2 mg by mouth 2 (two) times daily.  10/20/15  Yes Annita Brod, MD  divalproex (DEPAKOTE SPRINKLE) 125 MG capsule Take 125 mg by mouth 2 (two) times daily. Do not crush - for anxiety and mood   Yes Historical Provider, MD  escitalopram (LEXAPRO) 20 MG tablet Take 20 mg by mouth daily.   Yes Historical Provider, MD  ferrous sulfate 325 (65 FE) MG tablet Take 325 mg by mouth  daily with breakfast. Do not crush   Yes Historical Provider, MD  fish oil-omega-3 fatty acids 1000 MG capsule Take 1 g by mouth daily.    Yes Historical Provider, MD  fluticasone (FLONASE) 50 MCG/ACT nasal spray Place 2 sprays into both nostrils daily as needed for allergies or rhinitis.    Yes Historical Provider, MD  guaifenesin (ROBAFEN) 100 MG/5ML syrup Take 300 mg by mouth 3 (three) times daily as needed for cough.    Yes Historical Provider, MD  [place] (  DERMACLOUD) CREA Apply topically daily as needed (for buttocks).   Yes Historical Provider, MD  levothyroxine (SYNTHROID) 88 MCG tablet Take 88 mcg by mouth daily with breakfast.    Yes Historical Provider, MD  LORazepam (ATIVAN) 0.5 MG tablet Take 0.5 mg by mouth every 12 (twelve) hours as needed for anxiety.    Yes Historical Provider, MD  meloxicam (MOBIC) 15 MG tablet Take 15 mg by mouth daily as needed for pain.    Yes Historical Provider, MD  Menthol, Topical Analgesic, (BIOFREEZE) 4 % GEL Apply topically every 6 (six) hours as needed (for pain).   Yes Historical Provider, MD  omeprazole (PRILOSEC) 20 MG capsule Take 1 capsule (20 mg total) by mouth daily. 08/12/14  Yes Biagio Borg, MD  Oyster Shell 500 MG TABS Take 500 mg by mouth 2 (two) times daily.    Yes Historical Provider, MD  pravastatin (PRAVACHOL) 20 MG tablet Take 1 tablet (20 mg total) by mouth daily at 6 PM. Patient taking differently: Take 20 mg by mouth every evening.  07/09/15  Yes Kelvin Cellar, MD  sucralfate (CARAFATE) 1 g tablet Take 1 g by mouth every evening.   Yes Historical Provider, MD  traMADol (ULTRAM) 50 MG tablet Take 50 mg by mouth every 6 (six) hours as needed for moderate pain.    Yes Historical Provider, MD    Physical Exam: Vitals:   10/03/16 2000 10/03/16 2115 10/03/16 2130 10/04/16 0000  BP: 122/58 (!) 105/48 104/55 (!) 111/49  Pulse: 74 84 71 68  Resp: 14 20 18 24   Temp:      TempSrc:      SpO2: 96% 98% 94% 92%  Height:           Constitutional: NAD, calm, comfortable Eyes: PERRL, lids and conjunctivae normal ENMT: Mucous membranes are moist. Posterior pharynx clear of any exudate or lesions.Normal dentition.  Neck: normal, supple, no masses, no thyromegaly Respiratory: clear to auscultation bilaterally, no wheezing, no crackles. Normal respiratory effort. No accessory muscle use.  Cardiovascular: Regular rate and rhythm, no murmurs / rubs / gallops. No extremity edema. 2+ pedal pulses. No carotid bruits.  Abdomen: no tenderness, no masses palpated. No hepatosplenomegaly. Bowel sounds positive.  Musculoskeletal: no clubbing / cyanosis. No joint deformity upper and lower extremities. Good ROM, no contractures. Normal muscle tone.  Skin: no rashes, lesions, ulcers. No induration Neurologic: CN 2-12 grossly intact. Sensation intact, DTR normal. Strength 5/5 in all 4.  Psychiatric: Normal judgment and insight. Alert and oriented x 3. Normal mood.    Labs on Admission: I have personally reviewed following labs and imaging studies  CBC:  Recent Labs Lab 10/03/16 2136  WBC 5.4  NEUTROABS 2.2  HGB 10.7*  HCT 32.7*  MCV 98.5  PLT 517   Basic Metabolic Panel:  Recent Labs Lab 10/03/16 2136  NA 139  K 3.7  CL 100*  CO2 32  GLUCOSE 94  BUN 12  CREATININE 1.15*  CALCIUM 9.3   GFR: Estimated Creatinine Clearance: 35.7 mL/min (by C-G formula based on SCr of 1.15 mg/dL (H)). Liver Function Tests:  Recent Labs Lab 10/03/16 2136  AST 16  ALT <5*  ALKPHOS 36*  BILITOT 0.7  PROT 6.4*  ALBUMIN 3.4*   No results for input(s): LIPASE, AMYLASE in the last 168 hours. No results for input(s): AMMONIA in the last 168 hours. Coagulation Profile: No results for input(s): INR, PROTIME in the last 168 hours. Cardiac Enzymes: No results for input(s): CKTOTAL,  CKMB, CKMBINDEX, TROPONINI in the last 168 hours. BNP (last 3 results) No results for input(s): PROBNP in the last 8760 hours. HbA1C: No  results for input(s): HGBA1C in the last 72 hours. CBG: No results for input(s): GLUCAP in the last 168 hours. Lipid Profile: No results for input(s): CHOL, HDL, LDLCALC, TRIG, CHOLHDL, LDLDIRECT in the last 72 hours. Thyroid Function Tests: No results for input(s): TSH, T4TOTAL, FREET4, T3FREE, THYROIDAB in the last 72 hours. Anemia Panel: No results for input(s): VITAMINB12, FOLATE, FERRITIN, TIBC, IRON, RETICCTPCT in the last 72 hours. Urine analysis:    Component Value Date/Time   COLORURINE YELLOW 10/03/2016 2016   APPEARANCEUR HAZY (A) 10/03/2016 2016   LABSPEC 1.006 10/03/2016 2016   PHURINE 6.0 10/03/2016 2016   GLUCOSEU NEGATIVE 10/03/2016 2016   GLUCOSEU NEGATIVE 07/05/2015 1724   HGBUR SMALL (A) 10/03/2016 2016   BILIRUBINUR NEGATIVE 10/03/2016 2016   [place] NEGATIVE 10/03/2016 2016   PROTEINUR NEGATIVE 10/03/2016 2016   UROBILINOGEN 0.2 07/05/2015 1724   NITRITE NEGATIVE 10/03/2016 2016   LEUKOCYTESUR LARGE (A) 10/03/2016 2016   Sepsis Labs: @LABRCNTIP (procalcitonin:4,lacticidven:4) )No results found for this or any previous visit (from the past 240 hour(s)).   Radiological Exams on Admission: Dg Chest 2 View  Result Date: 10/03/2016 CLINICAL DATA:  Pain after fall EXAM: CHEST  2 VIEW COMPARISON:  07/09/2016 FINDINGS: The heart size and mediastinal contours are within normal limits for size with aortic atherosclerosis of the arch. Mild emphysematous hyperinflation of the lungs with left basilar scarring. No pneumonic consolidation, effusion or pneumothorax. Mild osteoarthritis of the AC and glenohumeral joints bilaterally. No acute rib fracture or pneumothorax. Both lungs are clear. The visualized skeletal structures are unremarkable. IMPRESSION: Mild emphysematous hyperinflation of the lungs with left basilar scarring. No pneumothorax. No acute osseous abnormality. Aortic atherosclerosis. Electronically Signed   By: Ashley Royalty M.D.   On: 10/03/2016 21:15   Ct Head  Wo Contrast  Result Date: 10/03/2016 CLINICAL DATA:  Left-sided headache after fall last evening EXAM: CT HEAD WITHOUT CONTRAST CT CERVICAL SPINE WITHOUT CONTRAST TECHNIQUE: Multidetector CT imaging of the head and cervical spine was performed following the standard protocol without intravenous contrast. Multiplanar CT image reconstructions of the cervical spine were also generated. COMPARISON:  CT head 07/09/2016 FINDINGS: CT HEAD FINDINGS BRAIN: There is sulcal and ventricular prominence consistent with superficial and central atrophy. No intraparenchymal hemorrhage, mass effect nor midline shift. Stable mild-to-moderate periventricular white matter hypodensities consistent with chronic small vessel ischemic disease. No acute large vascular territory infarcts. No abnormal extra-axial fluid collections. Basal cisterns are not effaced and midline. VASCULAR: Mild to moderate calcific atherosclerosis of the carotid siphons. SKULL: No skull fracture. No significant scalp soft tissue swelling. SINUSES/ORBITS: The mastoid air-cells are clear. The included paranasal sinuses are well-aerated.The included ocular globes and orbital contents are non-suspicious. OTHER: None. CT CERVICAL SPINE FINDINGS Alignment: Straightening of cervical lordosis with mild reversal at C3-4 without significant change. Grade 1 C2-3 anterolisthesis is also stable. Skull base and vertebrae: No acute fracture. No primary bone lesion or focal pathologic process. Soft tissues and spinal canal: No prevertebral fluid or swelling. No visible canal hematoma. Disc levels: C3 through C6 degenerative disc disease with bilateral uncovertebral joint spurring and dorsal osteophytes consistent with cervical spondylosis. Mild-to-moderate central canal osseous stenosis at C4-5. Upper chest: Pleuroparenchymal scarring at each lung apex. Other: Atherosclerotic calcifications of both extracranial carotids. IMPRESSION: 1. No acute intracranial abnormality noted.  Stable mild-to-moderate microvascular ischemic disease and atrophy. 2. Cervical  spondylosis without acute fracture most prominent from C3 through C6. Electronically Signed   By: Ashley Royalty M.D.   On: 10/03/2016 21:14   Ct Cervical Spine Wo Contrast  Result Date: 10/03/2016 CLINICAL DATA:  Left-sided headache after fall last evening EXAM: CT HEAD WITHOUT CONTRAST CT CERVICAL SPINE WITHOUT CONTRAST TECHNIQUE: Multidetector CT imaging of the head and cervical spine was performed following the standard protocol without intravenous contrast. Multiplanar CT image reconstructions of the cervical spine were also generated. COMPARISON:  CT head 07/09/2016 FINDINGS: CT HEAD FINDINGS BRAIN: There is sulcal and ventricular prominence consistent with superficial and central atrophy. No intraparenchymal hemorrhage, mass effect nor midline shift. Stable mild-to-moderate periventricular white matter hypodensities consistent with chronic small vessel ischemic disease. No acute large vascular territory infarcts. No abnormal extra-axial fluid collections. Basal cisterns are not effaced and midline. VASCULAR: Mild to moderate calcific atherosclerosis of the carotid siphons. SKULL: No skull fracture. No significant scalp soft tissue swelling. SINUSES/ORBITS: The mastoid air-cells are clear. The included paranasal sinuses are well-aerated.The included ocular globes and orbital contents are non-suspicious. OTHER: None. CT CERVICAL SPINE FINDINGS Alignment: Straightening of cervical lordosis with mild reversal at C3-4 without significant change. Grade 1 C2-3 anterolisthesis is also stable. Skull base and vertebrae: No acute fracture. No primary bone lesion or focal pathologic process. Soft tissues and spinal canal: No prevertebral fluid or swelling. No visible canal hematoma. Disc levels: C3 through C6 degenerative disc disease with bilateral uncovertebral joint spurring and dorsal osteophytes consistent with cervical spondylosis.  Mild-to-moderate central canal osseous stenosis at C4-5. Upper chest: Pleuroparenchymal scarring at each lung apex. Other: Atherosclerotic calcifications of both extracranial carotids. IMPRESSION: 1. No acute intracranial abnormality noted. Stable mild-to-moderate microvascular ischemic disease and atrophy. 2. Cervical spondylosis without acute fracture most prominent from C3 through C6. Electronically Signed   By: Ashley Royalty M.D.   On: 10/03/2016 21:14    EKG: Independently reviewed.  Assessment/Plan Principal Problem:   UTI (urinary tract infection) Active Problems:   Essential hypertension   Dementia in Alzheimer's disease    1. UTI - 1. Urine cx pending 2. Empiric rocephin 2. HTN - continue home meds 3. Dementia - continue home meds 4. Parkinson's - continue sinemet   DVT prophylaxis: Lovenox Code Status: Full Family Communication: No family in room Consults called: None Admission status: Place in 4, [place] [facility] Pager [phone] from 7PM-7AM  If 7AM-7PM, please contact the day physician for the patient [email or web address] Password [email or web address]  10/04/2016, 1:15 AM

## 2016-10-04 NOTE — NC FL2 (Signed)
MEDICAID FL2 LEVEL OF CARE SCREENING TOOL     IDENTIFICATION  Patient Name: [name] Birthdate: [birth year] Sex: female Admission Date (Current Location): 10/03/2016  [facility] and [place] Number:  Herbalist and Address:  [facility], [place] [address], [place], [place] [address]      Provider Number: [id number]  Attending Physician Name and Address:  Bonnielee Haff, MD  Relative Name and Phone Number:       Current Level of Care: Hospital Recommended Level of Care: [place] Prior Approval Number:    Date Approved/Denied:   PASRR Number: [id number]  Discharge Plan:  (ALF)    Current Diagnoses: Patient Active Problem List   Diagnosis Date Noted  . UTI (lower urinary tract infection) 10/17/2015  . Dementia in Alzheimer's disease 10/17/2015  . Encephalopathy, metabolic 24/03/7352  . Contusion, hip 07/07/2015  . Falls frequently 07/07/2015  . Cervical radiculopathy 07/07/2015  . Anxiety and depression 07/07/2015  . Gait disturbance 07/07/2015  . Hyperglycemia 07/07/2015  . FTT (failure to thrive) in adult 07/07/2015  . Parkinson disease ([place])   . UTI (urinary tract infection) 07/05/2015  . Chronic neck pain 07/05/2015  . Debility 07/05/2015  . Fatigue 01/19/2015  . Dysuria 01/19/2015  . Chronic restrictive lung disease 01/04/2015  . DOE (dyspnea on exertion) 11/19/2014  . Pulmonary hypertension, moderate to severe 11/19/2014  . Irregular heart beats 08/12/2014  . Aftercare following surgery of the circulatory system, [place] 04/19/2014  . Pain of left lower extremity-Knee 04/19/2014  . Weakness 02/11/2014  . Loss of weight 02/11/2014  . Valvular heart disease 01/19/2014  . Pulmonary hypertension 01/19/2014  . CAP (community acquired pneumonia) 01/08/2014  . Asthma with acute exacerbation 01/06/2014  . Fall 01/06/2014  . Aortic stenosis, mild 11/18/2013  . Preventative health care 11/18/2013  . Anemia,  unspecified 11/18/2013  . Tremor 09/21/2013  . Dyspnea 05/18/2013  . Physical deconditioning 05/18/2013  . OA (osteoarthritis) of knee 11/17/2012  . Hyperlipidemia 11/04/2012  . Occlusion and stenosis of carotid artery without mention of cerebral infarction 03/06/2012  . ASTHMA 10/09/2010  . Sudden visual loss 01/10/2009  . TRANSIENT ISCHEMIC ATTACK 01/10/2009  . CEREBROVASCULAR DISEASE 11/19/2007  . BRONCHITIS, RECURRENT 11/19/2007  . DIVERTICULOSIS OF COLON 11/19/2007  . Hypothyroidism 08/15/2007  . Anxiety state 08/15/2007  . Depression 08/15/2007  . Essential hypertension 08/15/2007  . GERD 08/15/2007  . Osteoarthritis 08/15/2007  . BACK PAIN, LUMBAR 08/15/2007    Orientation RESPIRATION BLADDER Height & Weight     Self, Time, Situation, Place  Normal Incontinent Weight:   Height:  5\' 5"  (165.1 cm)  BEHAVIORAL SYMPTOMS/MOOD NEUROLOGICAL BOWEL NUTRITION STATUS      Continent  (Please see discharge summary)  AMBULATORY STATUS COMMUNICATION OF NEEDS Skin   Limited Assist Verbally Normal                       Personal Care Assistance Level of Assistance  Bathing, Feeding, Dressing Bathing Assistance: Limited assistance Feeding assistance: Independent Dressing Assistance: Limited assistance     Functional Limitations Info  Sight, Hearing, Speech Sight Info: Adequate Hearing Info: Adequate Speech Info: Adequate    SPECIAL CARE FACTORS FREQUENCY                       Contractures Contractures Info: Not present    Additional Factors Info  Code Status, Allergies Code Status Info: Full Code Allergies Info: Relafen Nabumetone,  Lipitor Atorvastatin, Other, Penicillins           Current Medications (10/04/2016):  This is the current hospital active medication list Current Facility-Administered Medications  Medication Dose Route Frequency Provider Last Rate Last Dose  . amLODipine (NORVASC) tablet 5 mg  5 mg Oral Daily Etta Quill, DO      .  aspirin chewable tablet 81 mg  81 mg Oral Daily Etta Quill, DO   81 mg at 10/04/16 0945  . baclofen (LIORESAL) tablet 5 mg  5 mg Oral Q8H PRN Etta Quill, DO      . benzonatate (TESSALON) capsule 100 mg  100 mg Oral TID PRN Etta Quill, DO      . bisacodyl (DULCOLAX) EC tablet 5 mg  5 mg Oral Daily PRN Etta Quill, DO      . calcium carbonate (OS-CAL - dosed in mg of elemental calcium) tablet 1,000 mg of elemental calcium  2 tablet Oral Q12H PRN Etta Quill, DO      . carbidopa-levodopa (SINEMET IR) 25-100 MG per tablet immediate release 2 tablet  2 tablet Oral TID Etta Quill, DO   2 tablet at 10/04/16 0946  . cefTRIAXone (ROCEPHIN) 1 g in dextrose 5 % 50 mL IVPB  1 g Intravenous Q24H Etta Quill, DO      . diazepam (VALIUM) tablet 2 mg  2 mg Oral BID Etta Quill, DO   2 mg at 10/04/16 0946  . divalproex (DEPAKOTE SPRINKLE) capsule 125 mg  125 mg Oral BID Etta Quill, DO   125 mg at 10/04/16 1660  . enoxaparin (LOVENOX) injection 40 mg  40 mg Subcutaneous Daily Etta Quill, DO      . escitalopram (LEXAPRO) tablet 20 mg  20 mg Oral Daily Etta Quill, DO   20 mg at 10/04/16 0945  . ferrous sulfate tablet 325 mg  325 mg Oral Q breakfast Etta Quill, DO   325 mg at 10/04/16 0946  . fluticasone (FLONASE) 50 MCG/ACT nasal spray 2 spray  2 spray Each Nare Daily PRN Etta Quill, DO      . fluticasone furoate-vilanterol (BREO ELLIPTA) 200-25 MCG/INH 1 puff  1 puff Inhalation Daily Etta Quill, DO      . guaiFENesin (ROBITUSSIN) 100 MG/5ML solution 300 mg  300 mg Oral TID PRN Etta Quill, DO      . levothyroxine (SYNTHROID, LEVOTHROID) tablet 88 mcg  88 mcg Oral Q breakfast Etta Quill, DO   88 mcg at 10/04/16 0654  . LORazepam (ATIVAN) tablet 0.5 mg  0.5 mg Oral Q12H PRN Etta Quill, DO      . meloxicam [facility]) tablet 15 mg  15 mg Oral Daily PRN Etta Quill, DO      . pantoprazole (PROTONIX) EC tablet 40 mg  40 mg Oral Daily Etta Quill, DO   40 mg at 10/04/16 0945  . pravastatin (PRAVACHOL) tablet 20 mg  20 mg Oral QPM Etta Quill, DO      . sucralfate (CARAFATE) tablet 1 g  1 g Oral QPM Etta Quill, DO      . traMADol Veatrice Bourbon) tablet 50 mg  50 mg Oral Q6H PRN Etta Quill, DO   50 mg at 10/04/16 6301     Discharge Medications: Please see discharge summary for a list of discharge medications.  Relevant Imaging Results:  Relevant Lab Results:  Additional Information SSN: [id number]  Alla German, LCSW

## 2016-10-04 NOTE — Telephone Encounter (Signed)
Daughter called, patient is in the hospital and is expected to be discharged tomorrow.  Dr. Carles Collet had told the daughter that her mother needs more skilled facility, so the daughter would like to do that at this time.  The hospital told her she needs the doctors recommendation to be moved into skilled care.  Please call.

## 2016-10-05 ENCOUNTER — Emergency Department (HOSPITAL_BASED_OUTPATIENT_CLINIC_OR_DEPARTMENT_OTHER): Payer: Medicare Other

## 2016-10-05 ENCOUNTER — Encounter (HOSPITAL_BASED_OUTPATIENT_CLINIC_OR_DEPARTMENT_OTHER): Payer: Self-pay

## 2016-10-05 ENCOUNTER — Emergency Department (HOSPITAL_BASED_OUTPATIENT_CLINIC_OR_DEPARTMENT_OTHER)
Admission: EM | Admit: 2016-10-05 | Discharge: 2016-10-05 | Disposition: A | Discharge status: Home / Self Care | Payer: Medicare Other | Attending: Emergency Medicine | Admitting: Emergency Medicine

## 2016-10-05 DIAGNOSIS — G20 Parkinson's disease: Secondary | ICD-10-CM | POA: Diagnosis not present

## 2016-10-05 DIAGNOSIS — S199XXA Unspecified injury of neck, initial encounter: Secondary | ICD-10-CM | POA: Diagnosis not present

## 2016-10-05 DIAGNOSIS — W19XXXA Unspecified fall, initial encounter: Secondary | ICD-10-CM

## 2016-10-05 DIAGNOSIS — Y999 Unspecified external cause status: Secondary | ICD-10-CM | POA: Insufficient documentation

## 2016-10-05 DIAGNOSIS — Z7982 Long term (current) use of aspirin: Secondary | ICD-10-CM | POA: Insufficient documentation

## 2016-10-05 DIAGNOSIS — Y92002 Bathroom of unspecified non-institutional (private) residence single-family (private) house as the place of occurrence of the external cause: Secondary | ICD-10-CM | POA: Insufficient documentation

## 2016-10-05 DIAGNOSIS — J449 Chronic obstructive pulmonary disease, unspecified: Secondary | ICD-10-CM | POA: Insufficient documentation

## 2016-10-05 DIAGNOSIS — E039 Hypothyroidism, unspecified: Secondary | ICD-10-CM | POA: Insufficient documentation

## 2016-10-05 DIAGNOSIS — Z23 Encounter for immunization: Secondary | ICD-10-CM | POA: Insufficient documentation

## 2016-10-05 DIAGNOSIS — S8992XA Unspecified injury of left lower leg, initial encounter: Secondary | ICD-10-CM | POA: Diagnosis not present

## 2016-10-05 DIAGNOSIS — S01112A Laceration without foreign body of left eyelid and periocular area, initial encounter: Secondary | ICD-10-CM | POA: Diagnosis not present

## 2016-10-05 DIAGNOSIS — Z79899 Other long term (current) drug therapy: Secondary | ICD-10-CM | POA: Insufficient documentation

## 2016-10-05 DIAGNOSIS — W1809XA Striking against other object with subsequent fall, initial encounter: Secondary | ICD-10-CM | POA: Insufficient documentation

## 2016-10-05 DIAGNOSIS — S0083XA Contusion of other part of head, initial encounter: Secondary | ICD-10-CM

## 2016-10-05 DIAGNOSIS — S0990XA Unspecified injury of head, initial encounter: Secondary | ICD-10-CM | POA: Diagnosis not present

## 2016-10-05 DIAGNOSIS — S8002XA Contusion of left knee, initial encounter: Secondary | ICD-10-CM | POA: Insufficient documentation

## 2016-10-05 DIAGNOSIS — I10 Essential (primary) hypertension: Secondary | ICD-10-CM | POA: Diagnosis not present

## 2016-10-05 DIAGNOSIS — J45909 Unspecified asthma, uncomplicated: Secondary | ICD-10-CM | POA: Insufficient documentation

## 2016-10-05 DIAGNOSIS — M79662 Pain in left lower leg: Secondary | ICD-10-CM | POA: Diagnosis not present

## 2016-10-05 DIAGNOSIS — Y9389 Activity, other specified: Secondary | ICD-10-CM | POA: Insufficient documentation

## 2016-10-05 DIAGNOSIS — S5011XA Contusion of right forearm, initial encounter: Secondary | ICD-10-CM | POA: Insufficient documentation

## 2016-10-05 LAB — BASIC METABOLIC PANEL
Anion gap: 9 (ref 5–15)
BUN: 12 mg/dL (ref 6–20)
CALCIUM: 9.1 mg/dL (ref 8.9–10.3)
CO2: 31 mmol/L (ref 22–32)
CREATININE: 1.09 mg/dL — AB (ref 0.44–1.00)
Chloride: 102 mmol/L (ref 101–111)
GFR, EST AFRICAN AMERICAN: 54 mL/min — AB (ref >60)
GFR, EST NON AFRICAN AMERICAN: 47 mL/min — AB (ref >60)
Glucose, Bld: 80 mg/dL (ref 65–99)
Potassium: 3.7 mmol/L (ref 3.5–5.1)
SODIUM: 142 mmol/L (ref 135–145)

## 2016-10-05 LAB — URINE CULTURE

## 2016-10-05 LAB — CBC
HCT: 35.3 % — ABNORMAL LOW (ref 36.0–46.0)
HEMOGLOBIN: 11.6 g/dL — AB (ref 12.0–15.0)
MCH: 32.8 pg (ref 26.0–34.0)
MCHC: 32.9 g/dL (ref 30.0–36.0)
MCV: 99.7 fL (ref 78.0–100.0)
Platelets: 209 10*3/uL (ref 150–400)
RBC: 3.54 MIL/uL — ABNORMAL LOW (ref 3.87–5.11)
RDW: 13.5 % (ref 11.5–15.5)
WBC: 4.7 10*3/uL (ref 4.0–10.5)

## 2016-10-05 MED ORDER — TETANUS-DIPHTH-ACELL PERTUSSIS 5-2.5-18.5 LF-MCG/0.5 IM SUSP
0.5000 mL | Freq: Once | INTRAMUSCULAR | Status: AC
  Administered 2016-10-05: 0.5 mL via INTRAMUSCULAR

## 2016-10-05 MED ORDER — LORAZEPAM 0.5 MG PO TABS: 0.5000 mg | ORAL_TABLET | Freq: Two times a day (BID) | ORAL | 0 refills | Status: DC | PRN

## 2016-10-05 MED ORDER — CEFPODOXIME PROXETIL 200 MG PO TABS: 200.0000 mg | ORAL_TABLET | Freq: Two times a day (BID) | ORAL | 0 refills | Status: AC

## 2016-10-05 MED ORDER — CEFPODOXIME PROXETIL 200 MG PO TABS
200.0000 mg | ORAL_TABLET | Freq: Two times a day (BID) | ORAL | Status: DC
  Administered 2016-10-05: 200 mg via ORAL
  Filled 2016-10-05 (×2): qty 1

## 2016-10-05 MED ORDER — TRAMADOL HCL 50 MG PO TABS: 50.0000 mg | ORAL_TABLET | Freq: Four times a day (QID) | ORAL | 0 refills | Status: DC | PRN

## 2016-10-05 MED ORDER — LIDOCAINE HCL (PF) 1 % IJ SOLN
5.0000 mL | Freq: Once | INTRAMUSCULAR | Status: AC
  Administered 2016-10-05: 5 mL
  Filled 2016-10-05: qty 5

## 2016-10-05 MED ORDER — TETANUS-DIPHTH-ACELL PERTUSSIS 5-2.5-18.5 LF-MCG/0.5 IM SUSP
INTRAMUSCULAR | Status: AC
  Administered 2016-10-05: 0.5 mL via INTRAMUSCULAR
  Filled 2016-10-05: qty 0.5

## 2016-10-05 MED ORDER — CEFPODOXIME PROXETIL 200 MG PO TABS: 200.0000 mg | ORAL_TABLET | Freq: Two times a day (BID) | ORAL | 0 refills | Status: DC

## 2016-10-05 MED ORDER — DIAZEPAM 2 MG PO TABS: 2.0000 mg | ORAL_TABLET | Freq: Two times a day (BID) | ORAL | 0 refills | Status: DC

## 2016-10-05 NOTE — ED Notes (Signed)
Pt. Daughter Julian Hy called to ask about the Pt.  Pt. Gave permission to speak to her about her condition.

## 2016-10-05 NOTE — Discharge Instructions (Signed)

## 2016-10-05 NOTE — Progress Notes (Signed)
Patient was found sitting in the floor on her bottom. She stated that she was trying to get her phone. Patient states that she did not hit her head and nothing is hurting her. She also took a few assisted steps.

## 2016-10-05 NOTE — ED Provider Notes (Signed)
[facility] DEPT [facility] Provider Note   CSN: [id number] Arrival date & time: 10/05/16  1753   By signing my name below, I, Soijett Blue, attest that this documentation has been prepared under the direction and in the presence of Eliezer Mccoy, PA-C Electronically Signed: Soijett Blue, ED Scribe. 10/05/16. 7:23 PM.  History   Chief Complaint Chief Complaint  Patient presents with  . Head Injury    HPI [name] is a 82 y.o. female with a PMHx of dementia, COPD, HTN, who presents to the Emergency Department BIB [facility] from [place], complaining of head injury occurring PTA. Pt reports associated laceration to left sided head, bruising to left sided head, and left shin and knee pain. [facility] staff applied pressure to the wound without any medications for the relief of her symptoms. She reports that she fell while getting up from the toilet and struck her head on the floor. She states she lost her footing trying to pull her pants up. She denies LOC, back pain, neck pain, knee pain, CP, SOB, abdominal pain, nausea, vomiting, dysuria, and any other symptoms. Patient just discharged from the hospital today for urinary tract infection. Tetanus not up-to-date.   The history is provided by the patient and the EMS personnel. No language interpreter was used.    Past Medical History:  Diagnosis Date  . Acute cystitis   . Anemia   . Anxiety   . Aortic stenosis, mild 11/18/2013  . Asthma   . Back pain   . Bronchitis, acute   . Carotid artery occlusion   . Cerebrovascular disease   . COPD (chronic obstructive pulmonary disease) ([place])   . Depression   . Diverticulosis of colon   . DJD (degenerative joint disease)   . Fall 07/05/2015  . GERD (gastroesophageal reflux disease)   . History of sudden visual loss   . Hyperlipidemia   . Hypertension   . Hypothyroidism   . Pneumonia 2015   hx of x 2   . Pulmonary hypertension, moderate to severe 11/19/2014  . Shortness of breath    with exertion    . 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 Transient ischemic attack   . UTI (lower urinary tract infection)    hx of     Patient Active Problem List   Diagnosis Date Noted  . Acute encephalopathy 10/04/2016  . UTI (lower urinary tract infection) 10/17/2015  . Dementia in Alzheimer's disease 10/17/2015  . Encephalopathy, metabolic 08/01/7251  . Contusion, hip 07/07/2015  . Falls frequently 07/07/2015  . Cervical radiculopathy 07/07/2015  . Anxiety and depression 07/07/2015  . Gait disturbance 07/07/2015  . Hyperglycemia 07/07/2015  . FTT (failure to thrive) in adult 07/07/2015  . Parkinson disease ([place])   . UTI (urinary tract infection) 07/05/2015  . Chronic neck pain 07/05/2015  . Debility 07/05/2015  . Fatigue 01/19/2015  . Dysuria 01/19/2015  . Chronic restrictive lung disease 01/04/2015  . DOE (dyspnea on exertion) 11/19/2014  . Pulmonary hypertension, moderate to severe 11/19/2014  . Irregular heart beats 08/12/2014  . Aftercare following surgery of the circulatory system, [place] 04/19/2014  . Pain of left lower extremity-Knee 04/19/2014  . Weakness 02/11/2014  . Loss of weight 02/11/2014  . Valvular heart disease 01/19/2014  . Pulmonary hypertension 01/19/2014  . CAP (community acquired pneumonia) 01/08/2014  . Asthma with acute exacerbation 01/06/2014  . Fall 01/06/2014  . Aortic stenosis, mild 11/18/2013  . Preventative health care 11/18/2013  .  Anemia, unspecified 11/18/2013  . Tremor 09/21/2013  . Dyspnea 05/18/2013  . Physical deconditioning 05/18/2013  . OA (osteoarthritis) of knee 11/17/2012  . Hyperlipidemia 11/04/2012  . Occlusion and stenosis of carotid artery without mention of cerebral infarction 03/06/2012  . ASTHMA 10/09/2010  . Sudden visual loss 01/10/2009  . TRANSIENT ISCHEMIC ATTACK 01/10/2009  . CEREBROVASCULAR DISEASE 11/19/2007  . BRONCHITIS, RECURRENT 11/19/2007  . DIVERTICULOSIS OF COLON 11/19/2007  . Hypothyroidism  08/15/2007  . Anxiety state 08/15/2007  . Depression 08/15/2007  . Essential hypertension 08/15/2007  . GERD 08/15/2007  . Osteoarthritis 08/15/2007  . BACK PAIN, LUMBAR 08/15/2007    Past Surgical History:  Procedure Laterality Date  . CARDIAC CATHETERIZATION  02/18/14  . CARDIOTHORACIC PROCEDURE  01/13/2009   Right   . COLECTOMY    . ENDARTERECTOMY     right carotid endartarectomy - 2010  . FOOT SURGERY     nerve cut between toes   . LEFT AND RIGHT HEART CATHETERIZATION WITH CORONARY ANGIOGRAM N/A 02/18/2014   Procedure: LEFT AND RIGHT HEART CATHETERIZATION WITH CORONARY ANGIOGRAM;  Surgeon: Larey Dresser, MD;  Location: [facility] CATH LAB;  Service: Cardiovascular;  Laterality: N/A;  . REPLACEMENT TOTAL KNEE     right and left knee  . TEE WITHOUT CARDIOVERSION N/A 02/08/2014   Procedure: TRANSESOPHAGEAL ECHOCARDIOGRAM (TEE);  Surgeon: Larey Dresser, MD;  Location: [facility];  Service: Cardiovascular;  Laterality: N/A;  . TOTAL KNEE ARTHROPLASTY Left 11/17/2012   Procedure: TOTAL KNEE ARTHROPLASTY;  Surgeon: Gearlean Alf, MD;  Location: [facility];  Service: Orthopedics;  Laterality: Left;  . TUBAL LIGATION    . VAGINAL HYSTERECTOMY      OB History    No data available       Home Medications    Prior to Admission medications   Medication Sig Start Date End Date Taking? Authorizing Provider  ADVAIR HFA 230-21 MCG/ACT inhaler Inhale 2 puffs into the lungs 2 (two) times daily. Patient taking differently: Inhale 2 puffs into the lungs daily.  11/23/14   Biagio Borg, MD  amLODipine (NORVASC) 5 MG tablet Take 1 tablet (5 mg total) by mouth daily. Must keep appt for future refills Patient taking differently: Take 5 mg by mouth daily.  10/03/15   Biagio Borg, MD  aspirin 81 MG chewable tablet Chew 81 mg by mouth daily.    Historical Provider, MD  baclofen (LIORESAL) 10 MG tablet Take 5 mg by mouth every 8 (eight) hours as needed (spasms).  10/06/15   Historical Provider, MD    benzonatate (TESSALON) 100 MG capsule Take 100 mg by mouth 3 (three) times daily as needed for cough.     Historical Provider, MD  bisacodyl (DULCOLAX) 5 MG EC tablet Take 1 tablet (5 mg total) by mouth daily as needed for moderate constipation. 10/20/15   Annita Brod, MD  calcium carbonate (OS-CAL - DOSED IN MG OF ELEMENTAL CALCIUM) 1250 (500 Ca) MG tablet Take 2 tablets by mouth every 12 (twelve) hours as needed (for indigestion).    Historical Provider, MD  carbidopa-levodopa (SINEMET IR) 25-100 MG tablet Take 2 tablets by mouth 3 (three) times daily. for parkinson's disease    Historical Provider, MD  cefpodoxime (VANTIN) 200 MG tablet Take 1 tablet (200 mg total) by mouth every 12 (twelve) hours. 10/05/16 10/10/16  Bonnielee Haff, MD  Cranberry 250 MG CAPS Take 250 mg by mouth 2 (two) times daily. For UTI prevention    Historical Provider, MD  diazepam (VALIUM) 2 MG tablet Take 1 tablet (2 mg total) by mouth 2 (two) times daily. 10/05/16   Bonnielee Haff, MD  divalproex (DEPAKOTE SPRINKLE) 125 MG capsule Take 125 mg by mouth 2 (two) times daily. Do not crush - for anxiety and mood    Historical Provider, MD  escitalopram (LEXAPRO) 20 MG tablet Take 20 mg by mouth daily.    Historical Provider, MD  ferrous sulfate 325 (65 FE) MG tablet Take 325 mg by mouth daily with breakfast. Do not crush    Historical Provider, MD  fish oil-omega-3 fatty acids 1000 MG capsule Take 1 g by mouth daily.     Historical Provider, MD  fluticasone (FLONASE) 50 MCG/ACT nasal spray Place 2 sprays into both nostrils daily as needed for allergies or rhinitis.     Historical Provider, MD  guaifenesin (ROBAFEN) 100 MG/5ML syrup Take 300 mg by mouth 3 (three) times daily as needed for cough.     Historical Provider, MD  Infant Care Products [facility]) CREA Apply topically daily as needed (for buttocks).    Historical Provider, MD  levothyroxine (SYNTHROID) 88 MCG tablet Take 88 mcg by mouth daily with breakfast.      Historical Provider, MD  LORazepam (ATIVAN) 0.5 MG tablet Take 1 tablet (0.5 mg total) by mouth every 12 (twelve) hours as needed for anxiety. 10/05/16   Bonnielee Haff, MD  meloxicam (MOBIC) 15 MG tablet Take 15 mg by mouth daily as needed for pain.     Historical Provider, MD  Menthol, Topical Analgesic, (BIOFREEZE) 4 % GEL Apply topically every 6 (six) hours as needed (for pain).    Historical Provider, MD  omeprazole (PRILOSEC) 20 MG capsule Take 1 capsule (20 mg total) by mouth daily. 08/12/14   Biagio Borg, MD  Oyster Shell 500 MG TABS Take 500 mg by mouth 2 (two) times daily.     Historical Provider, MD  pravastatin (PRAVACHOL) 20 MG tablet Take 1 tablet (20 mg total) by mouth daily at 6 PM. Patient taking differently: Take 20 mg by mouth every evening.  07/09/15   Kelvin Cellar, MD  sucralfate (CARAFATE) 1 g tablet Take 1 g by mouth every evening.    Historical Provider, MD  traMADol (ULTRAM) 50 MG tablet Take 1 tablet (50 mg total) by mouth every 6 (six) hours as needed for moderate pain. 10/05/16   Bonnielee Haff, MD    Family History Family History  Problem Relation Age of Onset  . Emphysema Father   . Heart disease Mother   . Hyperlipidemia Mother   . Hypertension Mother   . Coronary artery disease Sister   . Heart disease Sister   . Hypertension Sister   . Varicose Veins Sister   . AAA (abdominal aortic aneurysm) Sister   . Rheumatic fever Brother     x2  . Heart attack Brother     Social History Social History  Substance Use Topics  . Smoking status: Never Smoker  . Smokeless tobacco: Never Used  . Alcohol use No     Allergies   Relafen [nabumetone]; Lipitor [atorvastatin]; Other; and Penicillins   Review of Systems Review of Systems  Constitutional: Negative for chills and fever.  Respiratory: Negative for shortness of breath.   Cardiovascular: Negative for chest pain.  Gastrointestinal: Negative for abdominal pain, nausea and vomiting.  Genitourinary:  Negative for dysuria.  Musculoskeletal: Positive for arthralgias (left shin). Negative for back pain and neck pain.  Skin: Positive for color  change (bruising to left sided head) and wound (laceration to left sided head). Negative for rash.  Neurological: Negative for syncope.  Psychiatric/Behavioral: The patient is not nervous/anxious.      Physical Exam Updated Vital Signs BP 120/56   Pulse 63   Temp 97.6 F (36.4 C) (Oral)   Resp 18   Ht 5\' 5"  (1.651 m)   Wt 64.4 kg   SpO2 96%   BMI 23.63 kg/m   Physical Exam  Constitutional: She appears well-developed and well-nourished. No distress.  HENT:  Head: Normocephalic. Head is with laceration.  Mouth/Throat: Oropharynx is clear and moist. No oropharyngeal exudate.  Crescent shaped 1.5 cm laceration above left eyebrow. Tenderness and ecchymosis to left peri-orbital region and right supra-orbital region.  Eyes: Conjunctivae are normal. Pupils are equal, round, and reactive to light. Right eye exhibits no discharge. Left eye exhibits no discharge. No scleral icterus.  Neck: Normal range of motion. Neck supple. No thyromegaly present.  Cardiovascular: Normal rate, regular rhythm, normal heart sounds and intact distal pulses.  Exam reveals no gallop and no friction rub.   No murmur heard. Pulmonary/Chest: Effort normal and breath sounds normal. No stridor. No respiratory distress. She has no wheezes. She has no rales.  Abdominal: Soft. Bowel sounds are normal. She exhibits no distension. There is no tenderness. There is no rebound and no guarding.  Musculoskeletal: She exhibits no edema.       Left knee: She exhibits ecchymosis. Tenderness found.  Left knee: anterior ecchymosis, few unspecified places with tenderness. Ecchymosis to left shin. Nl sensation. FROM of all extremities.  R forearm: 5 cm area of ecchymosis, no bony tenderness  Lymphadenopathy:    She has no cervical adenopathy.  Neurological: She is alert. Coordination  normal.  Skin: Skin is warm and dry. No rash noted. She is not diaphoretic. No pallor.  Psychiatric: She has a normal mood and affect.  Nursing note and vitals reviewed.    ED Treatments / Results  DIAGNOSTIC STUDIES: Oxygen Saturation is 92% on RA, nl by my interpretation.    COORDINATION OF CARE: 7:20 PM Discussed treatment plan with pt at bedside which includes update tetanus vaccination, CT head, CT cervical spine, tib/fib xray, left knee xray, and pt agreed to plan.    Radiology Dg Tibia/fibula Left  Result Date: 10/05/2016 CLINICAL DATA:  [place] while getting up from the toilet tonight. Persistent pain. EXAM: LEFT TIBIA AND FIBULA - 2 VIEW COMPARISON:  None. FINDINGS: Negative for acute fracture or dislocation. No radiopaque foreign body. No soft tissue gas. IMPRESSION: Negative. Electronically Signed   By: Andreas Newport M.D.   On: 10/05/2016 21:19   Ct Head Wo Contrast  Result Date: 10/05/2016 CLINICAL DATA:  Patient fell getting up from the 12 and struck head on floor. EXAM: CT HEAD WITHOUT CONTRAST CT CERVICAL SPINE WITHOUT CONTRAST TECHNIQUE: Multidetector CT imaging of the head and cervical spine was performed following the standard protocol without intravenous contrast. Multiplanar CT image reconstructions of the cervical spine were also generated. COMPARISON:  10/03/2016 FINDINGS: CT HEAD FINDINGS Brain: There is no evidence for acute hemorrhage, hydrocephalus, mass lesion, or abnormal extra-axial fluid collection. No definite CT evidence for acute infarction. Diffuse loss of parenchymal volume is consistent with atrophy. Patchy low attenuation in the deep hemispheric and periventricular white matter is nonspecific, but likely reflects chronic microvascular ischemic demyelination. Vascular: Atherosclerotic calcification is visualized in the carotid arteries. No dense MCA sign. Major dural sinuses are unremarkable. 1 No evidence  for fracture. No worrisome lytic or sclerotic lesion.  Skull: No evidence for fracture. No worrisome lytic or sclerotic lesion. Sinuses/Orbits: The visualized paranasal sinuses and mastoid air cells are clear. Visualized portions of the globes and intraorbital fat are unremarkable. Other: None. CT CERVICAL SPINE FINDINGS Alignment: Straightening of normal cervical lordosis again noted. Trace anterolisthesis of C2 on 3 is stable. Skull base and vertebrae: No acute fracture. No primary bone lesion or focal pathologic process. Soft tissues and spinal canal: No prevertebral fluid or swelling. No visible canal hematoma. Disc levels: Near complete loss of disc height is seen at C3-4 with advanced degenerative disc disease at C4-5 and C5-6. Endplate spurring and scattered facet overgrowth noted at these levels. Upper chest: Biapical pleural-parenchymal scarring. Other: None. IMPRESSION: 1. Stable.  No acute intracranial abnormality. 2. Atrophy with chronic small vessel white matter ischemic disease. 3. Degenerative changes from C2-3 down to C5-6 without evidence for acute cervical spine fracture. 4. Loss of cervical lordosis. This can be related to patient positioning, muscle spasm or soft tissue injury. Electronically Signed   By: Misty Stanley M.D.   On: 10/05/2016 21:25   Ct Cervical Spine Wo Contrast  Result Date: 10/05/2016 CLINICAL DATA:  Patient fell getting up from the 12 and struck head on floor. EXAM: CT HEAD WITHOUT CONTRAST CT CERVICAL SPINE WITHOUT CONTRAST TECHNIQUE: Multidetector CT imaging of the head and cervical spine was performed following the standard protocol without intravenous contrast. Multiplanar CT image reconstructions of the cervical spine were also generated. COMPARISON:  10/03/2016 FINDINGS: CT HEAD FINDINGS Brain: There is no evidence for acute hemorrhage, hydrocephalus, mass lesion, or abnormal extra-axial fluid collection. No definite CT evidence for acute infarction. Diffuse loss of parenchymal volume is consistent with atrophy. Patchy low  attenuation in the deep hemispheric and periventricular white matter is nonspecific, but likely reflects chronic microvascular ischemic demyelination. Vascular: Atherosclerotic calcification is visualized in the carotid arteries. No dense MCA sign. Major dural sinuses are unremarkable. 1 No evidence for fracture. No worrisome lytic or sclerotic lesion. Skull: No evidence for fracture. No worrisome lytic or sclerotic lesion. Sinuses/Orbits: The visualized paranasal sinuses and mastoid air cells are clear. Visualized portions of the globes and intraorbital fat are unremarkable. Other: None. CT CERVICAL SPINE FINDINGS Alignment: Straightening of normal cervical lordosis again noted. Trace anterolisthesis of C2 on 3 is stable. Skull base and vertebrae: No acute fracture. No primary bone lesion or focal pathologic process. Soft tissues and spinal canal: No prevertebral fluid or swelling. No visible canal hematoma. Disc levels: Near complete loss of disc height is seen at C3-4 with advanced degenerative disc disease at C4-5 and C5-6. Endplate spurring and scattered facet overgrowth noted at these levels. Upper chest: Biapical pleural-parenchymal scarring. Other: None. IMPRESSION: 1. Stable.  No acute intracranial abnormality. 2. Atrophy with chronic small vessel white matter ischemic disease. 3. Degenerative changes from C2-3 down to C5-6 without evidence for acute cervical spine fracture. 4. Loss of cervical lordosis. This can be related to patient positioning, muscle spasm or soft tissue injury. Electronically Signed   By: Misty Stanley M.D.   On: 10/05/2016 21:25   Dg Knee Complete 4 Views Left  Result Date: 10/05/2016 CLINICAL DATA:  [place] getting up from the toilet. EXAM: LEFT KNEE - COMPLETE 4+ VIEW COMPARISON:  None. FINDINGS: The total knee arthroplasty hardware appears intact. No fracture. No dislocation. No soft tissue foreign body or soft tissue gas. IMPRESSION: Negative. Electronically Signed   By: Valerie Roys.D.  On: 10/05/2016 21:21    Procedures .Marland KitchenLaceration Repair Date/Time: 10/05/2016 9:24 PM Performed by: Frederica Kuster Authorized by: Frederica Kuster   Consent:    Consent obtained:  Verbal   Consent given by:  Patient   Risks discussed:  Need for additional repair Anesthesia (see MAR for exact dosages):    Anesthesia method:  Local infiltration   Local anesthetic:  Lidocaine 1% w/o epi (2 cc used) Laceration details:    Location:  Face   Facial location: above left eyebrow.   Length (cm):  1.5 Repair type:    Repair type:  Simple Pre-procedure details:    Preparation:  Patient was prepped and draped in usual sterile fashion Exploration:    Hemostasis achieved with:  Direct pressure   Wound exploration: entire depth of wound probed and visualized     Wound extent: no foreign bodies/material noted     Contaminated: no   Treatment:    Area cleansed with:  Saline   Amount of cleaning:  Standard   Irrigation solution:  Sterile saline   Irrigation method:  Syringe   Visualized foreign bodies/material removed: no   Skin repair:    Repair method:  Sutures   Suture size:  6-0   Wound skin closure material used: Ethilon.   Suture technique:  Simple interrupted   Number of sutures:  5 Approximation:    Approximation:  Close Post-procedure details:    Dressing:  Adhesive bandage   Patient tolerance of procedure:  Tolerated well, no immediate complications    (including critical care time)  Medications Ordered in ED Medications  Tdap (BOOSTRIX) injection 0.5 mL (0.5 mLs Intramuscular Given 10/05/16 1821)  lidocaine (PF) (XYLOCAINE) 1 % injection 5 mL (5 mLs Infiltration Given 10/05/16 2209)     Initial Impression / Assessment and Plan / ED Course  I have reviewed the triage vital signs and the nursing notes.  Pertinent labs & imaging results that were available during my care of the patient were reviewed by me and considered in my medical decision making (see  chart for details).     CT head and C-spine show no acute abnormalities.x-rays of the left knee and tib-fib negative. Laceration repaired less than 8 hours from injury. Tetanus updated in the ED. Patient advised to have primary care provider or return to emergency department to have sutures removed in 7 days. Wound care discussed and outlined in discharge paperwork. Supportive treatment discussed. Patient understands and agrees with plan. Patient vitals stable throughout ED course discharged in satisfactory condition. Patient also evaluated by Dr. Jeneen Rinks who agrees with plan.  Final Clinical Impressions(s) / ED Diagnoses   Final diagnoses:  Contusion of face, initial encounter  Fall, initial encounter  Laceration of left periocular area without foreign body, initial encounter    New Prescriptions Discharge Medication List as of 10/05/2016 10:24 PM     I personally performed the services described in this documentation, which was scribed in my presence. The recorded information has been reviewed and is accurate.     Frederica Kuster, PA-C 10/06/16 8016    Tanna Furry, MD 10/17/16 1228

## 2016-10-05 NOTE — ED Notes (Signed)
[facility] here for transport at this time.  

## 2016-10-05 NOTE — ED Notes (Signed)
Suture Cart at bedside.  

## 2016-10-05 NOTE — Consult Note (Signed)
[facility] CM Primary Care Navigator  10/05/2016  Quinteria Morales 07/17/1935 [id number]   Met with patient at the bedside to identify possible discharge needs. Patient had a fall at facility that led to this admission.  Patient mentioned that she use to see Dr. Nadel Scott but now seeing Dr. Karen Richter with [facility] as the primary care provider.    Patient shared using [facility] in [address] to obtain medications without any problem.   Patient reports that she resides at [facility] [place] and staff manages and dispenses her medications.   She states that daughter- Marilyn or son - Harold provides transportation to her doctors' appointments.  Facility staff provides assistance with her care needs according to patient.  Discharge plan is back to her assisted living facility ([facility]) with home health services per MD note.  Patient voiced understanding to call primary care provider's office when she returns back to [facility] for a post discharge follow-up appointment within a week or sooner if needs arise. Patient letter (with PCP's contact number) was provided as a reminder.  Patient denies any concerns or issues as well as any needs for health management at this time.   For additional questions please contact:   A. , BSN, RN-BC [facility] PRIMARY CARE Navigator Cell: [phone]              

## 2016-10-05 NOTE — Progress Notes (Signed)
Physical Therapy Treatment Patient Details Name: [name] MRN: [id number] DOB: Apr 11, 1935 Today's Date: 10/05/2016    History of Present Illness Pt is a 82 yo female admitted through ED following a fall at her ALF on 10/03/16. Pt was diagnosed with a UTI. PMH significant for Parkinson's, Alzheimers, frequent UTI, acute cystitis, Anemia, Anxiety, aortic stenosis, Asthma, COPD, Bronchitis, DJD.     PT Comments    Patient required min/mod A for mobility this session. SOB with mobility and SpO2 mid 80s-93% on RA. Educated on pursed lip breathing. Current plan remains appropriate.  Follow Up Recommendations  Home health PT;Supervision for mobility/OOB     Equipment Recommendations  None recommended by PT    Recommendations for Other Services       Precautions / Restrictions Precautions Precautions: Fall Precaution Comments: fall lead to recent hospitalization Restrictions Weight Bearing Restrictions: No    Mobility  Bed Mobility Overal bed mobility: Needs Assistance Bed Mobility: Supine to Sit     Supine to sit: Min assist     General bed mobility comments: assist to elevate trunk into sitting; use of bed rail; cues for sequencing  Transfers Overall transfer level: Needs assistance Equipment used: 1 person hand held assist;Rolling walker (2 wheeled) Transfers: Sit to/from [facility] Sit to Stand: Min assist;Mod assist Stand pivot transfers: Min assist       General transfer comment: sit to stands X2; first trial with HHA and second with RW; cues for safe hand placement and assistance to steady; mod A to safely descend to recliner due to pt attempting to sit prematurely and needed guidance of buttocks into chair  Ambulation/Gait Ambulation/Gait assistance: Min assist Ambulation Distance (Feet): 4 Feet Assistive device: Rolling walker (2 wheeled) Gait Pattern/deviations: Step-through pattern;Decreased stride length;Narrow base of support      General Gait Details: pt with NBS and when turning scissored LE; no LOB; min A for balance and safety; pt SOB with mobility and SpO2 mid 80s to 93% on RA; cues for pursed lip breathing technique   Stairs            Wheelchair Mobility    Modified Rankin (Stroke Patients Only)       Balance Overall balance assessment: Needs assistance Sitting-balance support: Bilateral upper extremity supported;Feet supported Sitting balance-Leahy Scale: Fair     Standing balance support: Bilateral upper extremity supported Standing balance-Leahy Scale: Poor Standing balance comment: Relies on two hand support for stability                    Cognition Arousal/Alertness: Awake/alert Behavior During Therapy: WFL for tasks assessed/performed Overall Cognitive Status: No family/caregiver present to determine baseline cognitive functioning                      Exercises      General Comments General comments (skin integrity, edema, etc.): pt with athetoid-like (writhing) movements       Pertinent Vitals/Pain Pain Assessment: No/denies pain    Home Living                      Prior Function            PT Goals (current goals can now be found in the care plan section) Acute Rehab PT Goals Patient Stated Goal: none stated PT Goal Formulation: Patient unable to participate in goal setting Time For Goal Achievement: 10/11/16 Potential to Achieve Goals: Good Progress towards PT goals: Progressing toward  goals    Frequency    Min 3X/week      PT Plan Current plan remains appropriate    Co-evaluation             End of Session Equipment Utilized During Treatment: Gait belt Activity Tolerance: Patient tolerated treatment well Patient left: in chair;with call bell/phone within reach;with chair alarm set Nurse Communication: Mobility status PT Visit Diagnosis: Unsteadiness on feet (R26.81);History of falling (Z91.81)     Time: 1345-1405 PT  Time Calculation (min) (ACUTE ONLY): 20 min  Charges:  $Therapeutic Activity: 8-22 mins                    G Codes:       [place] April, PTA Pager: [phone]   10/05/2016, 2:44 PM

## 2016-10-05 NOTE — Discharge Summary (Signed)
[facility]  Physician Discharge Summary   Patient ID: [name] MRN: [id number] DOB/AGE: 1934-09-25 82 y.o.  Admit date: 10/03/2016 Discharge date: 10/05/2016  PCP: No PCP Per Patient  DISCHARGE DIAGNOSES:  Principal Problem:   UTI (urinary tract infection) Active Problems:   Essential hypertension   Dementia in Alzheimer's disease   Acute encephalopathy   RECOMMENDATIONS FOR OUTPATIENT FOLLOW UP: 1. Home health at assisted living facility 2. Assisted living facility to look into patient's medications including diazepam and Ativan. Ideally, she should not be taking both of these to avoid excessive sedation and other complications. 3. Follow-up by her primary care provider in one to 2 weeks.  DISCHARGE CONDITION: fair  Diet recommendation: As before   INITIAL HISTORY: 82 year old female with a past medical history of Parkinson's disease, Alzheimer's dementia, recurrent UTI, presented from skilled nursing facility after sustaining a fall. She was not found to have any injuries, however, was found to have an abnormal UA. She did mention symptoms of urinary frequency and urgency. She was also noted to be more confused than her baseline. She was hospitalized for further management.   HOSPITAL COURSE:   Urinary tract infection with acute encephalopathy. Patient was found to be more confused than her baseline. Her UA was abnormal, suggesting UTI. She was initiated on IV antibiotics. Urine cultures were sent. Unfortunately, multiple species are noted on the urine culture. She will be transitioned to Vantin from ceftriaxone. Penicillin allergy is noted, however, she has tolerated ceftriaxone without any difficulties. She will be treated for 5 more days. She is afebrile. Mental status is now back to baseline.   History of essential hypertension. Stable. Continue with home medications.   History of dementia. Stable. Continue with home medications.  History of  Parkinson's disease. Stable. Continue Sinemet.  History of hypothyroidism. Continue with her levothyroxin   Overall improved. Discussed in detail with patient's daughter. Okay for discharge back to assisted living facility. Home health will be ordered for physical therapy.  Apparently patient slid off her bed to the floor when she was trying to reach her phone charger. She denies any pain. No injuries noted. She was ambulated by physical therapy. Sats did drop to the mid 80s but then recovered to greater than 90%. Patient does have a history of COPD based on her chest x-ray. She denies any chest pain or shortness of breath. Lungs are clear to auscultation. She has tolerated Vantin. Okay for discharge.  PERTINENT LABS:  The results of significant diagnostics from this hospitalization (including imaging, microbiology, ancillary and laboratory) are listed below for reference.    Microbiology: Recent Results (from the past 240 hour(s))  Urine culture     Status: Abnormal   Collection Time: 10/03/16  8:16 PM  Result Value Ref Range Status   Specimen Description URINE, CLEAN CATCH  Final   Special Requests NONE  Final   Culture MULTIPLE SPECIES PRESENT, SUGGEST RECOLLECTION (A)  Final   Report Status 10/05/2016 FINAL  Final     Labs: Basic Metabolic Panel:  Recent Labs Lab 10/03/16 2136 10/05/16 0728  NA 139 142  K 3.7 3.7  CL 100* 102  CO2 32 31  GLUCOSE 94 80  BUN 12 12  CREATININE 1.15* 1.09*  CALCIUM 9.3 9.1   Liver Function Tests:  Recent Labs Lab 10/03/16 2136  AST 16  ALT <5*  ALKPHOS 36*  BILITOT 0.7  PROT 6.4*  ALBUMIN 3.4*  CBC:  Recent Labs Lab 10/03/16 2136 10/05/16 5277  WBC 5.4 4.7  NEUTROABS 2.2  --   HGB 10.7* 11.6*  HCT 32.7* 35.3*  MCV 98.5 99.7  PLT 204 209    IMAGING STUDIES Dg Chest 2 View  Result Date: 10/03/2016 CLINICAL DATA:  Pain after fall EXAM: CHEST  2 VIEW COMPARISON:  07/09/2016 FINDINGS: The heart size and mediastinal  contours are within normal limits for size with aortic atherosclerosis of the arch. Mild emphysematous hyperinflation of the lungs with left basilar scarring. No pneumonic consolidation, effusion or pneumothorax. Mild osteoarthritis of the AC and glenohumeral joints bilaterally. No acute rib fracture or pneumothorax. Both lungs are clear. The visualized skeletal structures are unremarkable. IMPRESSION: Mild emphysematous hyperinflation of the lungs with left basilar scarring. No pneumothorax. No acute osseous abnormality. Aortic atherosclerosis. Electronically Signed   By: Ashley Royalty M.D.   On: 10/03/2016 21:15   Ct Head Wo Contrast  Result Date: 10/03/2016 CLINICAL DATA:  Left-sided headache after fall last evening EXAM: CT HEAD WITHOUT CONTRAST CT CERVICAL SPINE WITHOUT CONTRAST TECHNIQUE: Multidetector CT imaging of the head and cervical spine was performed following the standard protocol without intravenous contrast. Multiplanar CT image reconstructions of the cervical spine were also generated. COMPARISON:  CT head 07/09/2016 FINDINGS: CT HEAD FINDINGS BRAIN: There is sulcal and ventricular prominence consistent with superficial and central atrophy. No intraparenchymal hemorrhage, mass effect nor midline shift. Stable mild-to-moderate periventricular white matter hypodensities consistent with chronic small vessel ischemic disease. No acute large vascular territory infarcts. No abnormal extra-axial fluid collections. Basal cisterns are not effaced and midline. VASCULAR: Mild to moderate calcific atherosclerosis of the carotid siphons. SKULL: No skull fracture. No significant scalp soft tissue swelling. SINUSES/ORBITS: The mastoid air-cells are clear. The included paranasal sinuses are well-aerated.The included ocular globes and orbital contents are non-suspicious. OTHER: None. CT CERVICAL SPINE FINDINGS Alignment: Straightening of cervical lordosis with mild reversal at C3-4 without significant change. Grade  1 C2-3 anterolisthesis is also stable. Skull base and vertebrae: No acute fracture. No primary bone lesion or focal pathologic process. Soft tissues and spinal canal: No prevertebral fluid or swelling. No visible canal hematoma. Disc levels: C3 through C6 degenerative disc disease with bilateral uncovertebral joint spurring and dorsal osteophytes consistent with cervical spondylosis. Mild-to-moderate central canal osseous stenosis at C4-5. Upper chest: Pleuroparenchymal scarring at each lung apex. Other: Atherosclerotic calcifications of both extracranial carotids. IMPRESSION: 1. No acute intracranial abnormality noted. Stable mild-to-moderate microvascular ischemic disease and atrophy. 2. Cervical spondylosis without acute fracture most prominent from C3 through C6. Electronically Signed   By: Ashley Royalty M.D.   On: 10/03/2016 21:14   Ct Cervical Spine Wo Contrast  Result Date: 10/03/2016 CLINICAL DATA:  Left-sided headache after fall last evening EXAM: CT HEAD WITHOUT CONTRAST CT CERVICAL SPINE WITHOUT CONTRAST TECHNIQUE: Multidetector CT imaging of the head and cervical spine was performed following the standard protocol without intravenous contrast. Multiplanar CT image reconstructions of the cervical spine were also generated. COMPARISON:  CT head 07/09/2016 FINDINGS: CT HEAD FINDINGS BRAIN: There is sulcal and ventricular prominence consistent with superficial and central atrophy. No intraparenchymal hemorrhage, mass effect nor midline shift. Stable mild-to-moderate periventricular white matter hypodensities consistent with chronic small vessel ischemic disease. No acute large vascular territory infarcts. No abnormal extra-axial fluid collections. Basal cisterns are not effaced and midline. VASCULAR: Mild to moderate calcific atherosclerosis of the carotid siphons. SKULL: No skull fracture. No significant scalp soft tissue swelling. SINUSES/ORBITS: The mastoid air-cells are clear. The included paranasal  sinuses are well-aerated.The included ocular  globes and orbital contents are non-suspicious. OTHER: None. CT CERVICAL SPINE FINDINGS Alignment: Straightening of cervical lordosis with mild reversal at C3-4 without significant change. Grade 1 C2-3 anterolisthesis is also stable. Skull base and vertebrae: No acute fracture. No primary bone lesion or focal pathologic process. Soft tissues and spinal canal: No prevertebral fluid or swelling. No visible canal hematoma. Disc levels: C3 through C6 degenerative disc disease with bilateral uncovertebral joint spurring and dorsal osteophytes consistent with cervical spondylosis. Mild-to-moderate central canal osseous stenosis at C4-5. Upper chest: Pleuroparenchymal scarring at each lung apex. Other: Atherosclerotic calcifications of both extracranial carotids. IMPRESSION: 1. No acute intracranial abnormality noted. Stable mild-to-moderate microvascular ischemic disease and atrophy. 2. Cervical spondylosis without acute fracture most prominent from C3 through C6. Electronically Signed   By: Ashley Royalty M.D.   On: 10/03/2016 21:14    DISCHARGE EXAMINATION: Vitals:   10/04/16 2030 10/05/16 0627 10/05/16 1251 10/05/16 1252  BP: (!) 105/43 (!) 105/42 (!) 109/53   Pulse: 89 63 71   Resp: 16 16 16    Temp: 98.4 F (36.9 C) 98 F (36.7 C) 97.6 F (36.4 C)   TempSrc: Oral Oral    SpO2: 93% 94% 96% 96%  Height:       General appearance: alert, cooperative, appears stated age and no distress Resp: clear to auscultation bilaterally Cardio: regular rate and rhythm, S1, S2 normal, no murmur, click, rub or gallop GI: soft, non-tender; bowel sounds normal; no masses,  no organomegaly Extremities: extremities normal, atraumatic, no cyanosis or edema  DISPOSITION: Back to her assisted living facility with home health  Discharge Instructions    Call MD for:  difficulty breathing, headache or visual disturbances    Complete by:  As directed    Call MD for:  extreme  fatigue    Complete by:  As directed    Call MD for:  persistant dizziness or light-headedness    Complete by:  As directed    Call MD for:  persistant nausea and vomiting    Complete by:  As directed    Call MD for:  severe uncontrolled pain    Complete by:  As directed    Call MD for:  temperature >100.4    Complete by:  As directed    Discharge instructions    Complete by:  As directed    Needs to be seen by her primary care provider within one week.  You were cared for by a hospitalist during your hospital stay. If you have any questions about your discharge medications or the care you received while you were in the hospital after you are discharged, you can call the unit and asked to speak with the hospitalist on call if the hospitalist that took care of you is not available. Once you are discharged, your primary care physician will handle any further medical issues. Please note that NO REFILLS for any discharge medications will be authorized once you are discharged, as it is imperative that you return to your primary care physician (or establish a relationship with a primary care physician if you do not have one) for your aftercare needs so that they can reassess your need for medications and monitor your lab values. If you do not have a primary care physician, you can call [phone] for a physician referral.   Increase activity slowly    Complete by:  As directed       ALLERGIES:  Allergies  Allergen Reactions  . Relafen [Nabumetone] Diarrhea  and Other (See Comments)    GI / Urinary Bleeding  . Lipitor [Atorvastatin] Other (See Comments)    Causes memory loss  . Other Other (See Comments)    "orvail" unknown:  Causes Bloody stool  . Penicillins Hives, Itching, Swelling and Rash    Has patient had a PCN reaction causing immediate rash, facial/tongue/throat swelling, SOB or lightheadedness with hypotension: no Has patient had a PCN reaction causing severe rash involving mucus  membranes or skin necrosis: unknown Has patient had a PCN reaction that required hospitalization no Has patient had a PCN reaction occurring within the last 10 years: no If all of the above answers are "NO", then may proceed with Cephalosporin use.      Current Discharge Medication List    START taking these medications   Details  cefpodoxime (VANTIN) 200 MG tablet Take 1 tablet (200 mg total) by mouth every 12 (twelve) hours. Qty: 10 tablet, Refills: 0      CONTINUE these medications which have CHANGED   Details  diazepam (VALIUM) 2 MG tablet Take 1 tablet (2 mg total) by mouth 2 (two) times daily. Qty: 10 tablet, Refills: 0    LORazepam (ATIVAN) 0.5 MG tablet Take 1 tablet (0.5 mg total) by mouth every 12 (twelve) hours as needed for anxiety. Qty: 10 tablet, Refills: 0    traMADol (ULTRAM) 50 MG tablet Take 1 tablet (50 mg total) by mouth every 6 (six) hours as needed for moderate pain. Qty: 10 tablet, Refills: 0      CONTINUE these medications which have NOT CHANGED   Details  ADVAIR HFA 230-21 MCG/ACT inhaler Inhale 2 puffs into the lungs 2 (two) times daily. Qty: 1 Inhaler, Refills: 3    amLODipine (NORVASC) 5 MG tablet Take 1 tablet (5 mg total) by mouth daily. Must keep appt for future refills Qty: 30 tablet, Refills: 0   Associated Diagnoses: Essential hypertension    aspirin 81 MG chewable tablet Chew 81 mg by mouth daily.    baclofen (LIORESAL) 10 MG tablet Take 5 mg by mouth every 8 (eight) hours as needed (spasms).  Refills: 0    benzonatate (TESSALON) 100 MG capsule Take 100 mg by mouth 3 (three) times daily as needed for cough.     bisacodyl (DULCOLAX) 5 MG EC tablet Take 1 tablet (5 mg total) by mouth daily as needed for moderate constipation. Qty: 30 tablet, Refills: 0    calcium carbonate (OS-CAL - DOSED IN MG OF ELEMENTAL CALCIUM) 1250 (500 Ca) MG tablet Take 2 tablets by mouth every 12 (twelve) hours as needed (for indigestion).      carbidopa-levodopa (SINEMET IR) 25-100 MG tablet Take 2 tablets by mouth 3 (three) times daily. for parkinson's disease    Cranberry 250 MG CAPS Take 250 mg by mouth 2 (two) times daily. For UTI prevention    divalproex (DEPAKOTE SPRINKLE) 125 MG capsule Take 125 mg by mouth 2 (two) times daily. Do not crush - for anxiety and mood    escitalopram (LEXAPRO) 20 MG tablet Take 20 mg by mouth daily.    ferrous sulfate 325 (65 FE) MG tablet Take 325 mg by mouth daily with breakfast. Do not crush    fish oil-omega-3 fatty acids 1000 MG capsule Take 1 g by mouth daily.     fluticasone (FLONASE) 50 MCG/ACT nasal spray Place 2 sprays into both nostrils daily as needed for allergies or rhinitis.     guaifenesin (ROBAFEN) 100 MG/5ML syrup Take 300  mg by mouth 3 (three) times daily as needed for cough.     Infant Care Products (DERMACLOUD) CREA Apply topically daily as needed (for buttocks).    levothyroxine (SYNTHROID) 88 MCG tablet Take 88 mcg by mouth daily with breakfast.     meloxicam (MOBIC) 15 MG tablet Take 15 mg by mouth daily as needed for pain.     Menthol, Topical Analgesic, (BIOFREEZE) 4 % GEL Apply topically every 6 (six) hours as needed (for pain).    omeprazole (PRILOSEC) 20 MG capsule Take 1 capsule (20 mg total) by mouth daily. Qty: 90 capsule, Refills: 3    Oyster Shell 500 MG TABS Take 500 mg by mouth 2 (two) times daily.     pravastatin (PRAVACHOL) 20 MG tablet Take 1 tablet (20 mg total) by mouth daily at 6 PM. Qty: 30 tablet, Refills: 1    sucralfate (CARAFATE) 1 g tablet Take 1 g by mouth every evening.          TOTAL DISCHARGE TIME: 35 mins  [facility] Pager [phone]  10/05/2016, 2:30 PM

## 2016-10-05 NOTE — ED Notes (Signed)
Patient transported to CT 

## 2016-10-05 NOTE — Progress Notes (Signed)
Patient to be discharged to [facility]. Nurse called and gave report to [facility]. IV removed. Patient family made aware that patient is to be discharged. Patient is waiting for [facility] for transportation

## 2016-10-05 NOTE — Discharge Instructions (Signed)
Treatment: Keep your wound dry and dressing applied until this time tomorrow. After 24 hours, you may wash with warm soapy water. Dry and apply antibiotic ointment and clean dressing. Do this daily until your sutures are removed.  Follow-up: Please follow-up with your primary care provider or return to emergency department in 7 days for suture removal. Be aware of signs of infection: fever, increasing pain, redness, swelling, drainage from the area. Please call your primary care provider or return to emergency department if you develop any of these symptoms or if any of the sutures come out prior to removal. Please return to the emergency department if you develop any other new or worsening symptoms.

## 2016-10-05 NOTE — ED Triage Notes (Signed)
Pt arrived via [facility] ambulance. EMT reports pt from [facility] and pt fell while toileting. Pt has lac to head that is bandaged and reported L knee pain. Pt is A/Ox4 answering questions appropriately has no amnesia to event. NAD.   Also, of note pt was d/c from [facility] today from and admission to treat a UTI.

## 2016-10-05 NOTE — Plan of Care (Signed)
Problem: Pain Managment: Goal: General experience of comfort will improve Outcome: Progressing Medicated once for pain with moderate relief  Problem: Skin Integrity: Goal: Risk for impaired skin integrity will decrease Outcome: Progressing No skin issues noted  Problem: Tissue Perfusion: Goal: Risk factors for ineffective tissue perfusion will decrease Outcome: Progressing On ASA and Lovenox   Problem: Activity: Goal: Risk for activity intolerance will decrease Outcome: Progressing Tolerates bed activities well

## 2016-10-05 NOTE — Clinical Social Work Note (Signed)
Clinical Social Worker facilitated patient discharge including contacting patient family and facility to confirm patient discharge plans.  Clinical information faxed to facility and family agreeable with plan.  CSW arranged ambulance transport via [facility] to [facility].  RN to call [phone] for report prior to discharge.  Clinical Social Worker will sign off for now as social work intervention is no longer needed. Please consult [place] again if new need arises.  [address] [place]

## 2016-10-05 NOTE — ED Notes (Signed)
Assisted pt on bedpan. 

## 2016-10-09 DIAGNOSIS — G308 Other Alzheimer's disease: Secondary | ICD-10-CM | POA: Diagnosis not present

## 2016-10-09 DIAGNOSIS — I10 Essential (primary) hypertension: Secondary | ICD-10-CM | POA: Diagnosis not present

## 2016-10-09 DIAGNOSIS — R269 Unspecified abnormalities of gait and mobility: Secondary | ICD-10-CM | POA: Diagnosis not present

## 2016-10-09 DIAGNOSIS — E039 Hypothyroidism, unspecified: Secondary | ICD-10-CM | POA: Diagnosis not present

## 2016-10-09 DIAGNOSIS — G20 Parkinson's disease: Secondary | ICD-10-CM | POA: Diagnosis not present

## 2016-10-09 DIAGNOSIS — J449 Chronic obstructive pulmonary disease, unspecified: Secondary | ICD-10-CM | POA: Diagnosis not present

## 2016-10-09 DIAGNOSIS — K219 Gastro-esophageal reflux disease without esophagitis: Secondary | ICD-10-CM | POA: Diagnosis not present

## 2016-10-09 DIAGNOSIS — R0602 Shortness of breath: Secondary | ICD-10-CM | POA: Diagnosis not present

## 2016-10-11 DIAGNOSIS — M545 Low back pain: Secondary | ICD-10-CM | POA: Diagnosis not present

## 2016-10-11 DIAGNOSIS — R1311 Dysphagia, oral phase: Secondary | ICD-10-CM | POA: Diagnosis not present

## 2016-10-11 DIAGNOSIS — J449 Chronic obstructive pulmonary disease, unspecified: Secondary | ICD-10-CM | POA: Diagnosis not present

## 2016-10-11 DIAGNOSIS — F418 Other specified anxiety disorders: Secondary | ICD-10-CM | POA: Diagnosis not present

## 2016-10-11 DIAGNOSIS — W19XXXS Unspecified fall, sequela: Secondary | ICD-10-CM | POA: Diagnosis not present

## 2016-10-11 DIAGNOSIS — G20 Parkinson's disease: Secondary | ICD-10-CM | POA: Diagnosis not present

## 2016-10-14 ENCOUNTER — Emergency Department (HOSPITAL_BASED_OUTPATIENT_CLINIC_OR_DEPARTMENT_OTHER)
Admission: EM | Admit: 2016-10-14 | Discharge: 2016-10-15 | Disposition: A | Discharge status: Home / Self Care | Payer: Medicare Other | Attending: Emergency Medicine | Admitting: Emergency Medicine

## 2016-10-14 ENCOUNTER — Emergency Department (HOSPITAL_BASED_OUTPATIENT_CLINIC_OR_DEPARTMENT_OTHER): Payer: Medicare Other

## 2016-10-14 ENCOUNTER — Encounter (HOSPITAL_BASED_OUTPATIENT_CLINIC_OR_DEPARTMENT_OTHER): Payer: Self-pay | Admitting: Emergency Medicine

## 2016-10-14 DIAGNOSIS — S0993XA Unspecified injury of face, initial encounter: Secondary | ICD-10-CM | POA: Diagnosis not present

## 2016-10-14 DIAGNOSIS — Y92121 Bathroom in nursing home as the place of occurrence of the external cause: Secondary | ICD-10-CM | POA: Diagnosis not present

## 2016-10-14 DIAGNOSIS — J45909 Unspecified asthma, uncomplicated: Secondary | ICD-10-CM | POA: Diagnosis not present

## 2016-10-14 DIAGNOSIS — R51 Headache: Secondary | ICD-10-CM | POA: Diagnosis not present

## 2016-10-14 DIAGNOSIS — W01198A Fall on same level from slipping, tripping and stumbling with subsequent striking against other object, initial encounter: Secondary | ICD-10-CM | POA: Diagnosis not present

## 2016-10-14 DIAGNOSIS — S0012XA Contusion of left eyelid and periocular area, initial encounter: Secondary | ICD-10-CM | POA: Insufficient documentation

## 2016-10-14 DIAGNOSIS — E039 Hypothyroidism, unspecified: Secondary | ICD-10-CM | POA: Diagnosis not present

## 2016-10-14 DIAGNOSIS — J449 Chronic obstructive pulmonary disease, unspecified: Secondary | ICD-10-CM | POA: Diagnosis not present

## 2016-10-14 DIAGNOSIS — I10 Essential (primary) hypertension: Secondary | ICD-10-CM | POA: Insufficient documentation

## 2016-10-14 DIAGNOSIS — Z7982 Long term (current) use of aspirin: Secondary | ICD-10-CM | POA: Diagnosis not present

## 2016-10-14 DIAGNOSIS — Y939 Activity, unspecified: Secondary | ICD-10-CM | POA: Diagnosis not present

## 2016-10-14 DIAGNOSIS — M25569 Pain in unspecified knee: Secondary | ICD-10-CM | POA: Diagnosis not present

## 2016-10-14 DIAGNOSIS — Z043 Encounter for examination and observation following other accident: Secondary | ICD-10-CM | POA: Diagnosis not present

## 2016-10-14 DIAGNOSIS — S0990XA Unspecified injury of head, initial encounter: Secondary | ICD-10-CM | POA: Diagnosis present

## 2016-10-14 DIAGNOSIS — W19XXXA Unspecified fall, initial encounter: Secondary | ICD-10-CM

## 2016-10-14 DIAGNOSIS — Y999 Unspecified external cause status: Secondary | ICD-10-CM | POA: Diagnosis not present

## 2016-10-14 NOTE — ED Triage Notes (Signed)
Brought in by EMS s/p fall off toilet. Witnessed fall pt hit R knee and side of head on wall. No LOC. Facility reports to EMS frequent falls.

## 2016-10-14 NOTE — ED Provider Notes (Signed)
[facility] DEPT [facility] Provider Note   CSN: [id number] Arrival date & time: 10/14/16  2202   By signing my name below, I, Delton Prairie, attest that this documentation has been prepared under the direction and in the presence of Deno Etienne, DO  Electronically Signed: Delton Prairie, ED Scribe. 10/14/16. 10:37 PM.   History   Chief Complaint Chief Complaint  Patient presents with  . Fall    The history is provided by the EMS personnel. No language interpreter was used.   HPI Comments:  [name] is a 82 y.o. female who presents to the Emergency Department, via EMS from a nursing facility, s/p a fall which occurred PTA. Pt was using the bathroom when she fell off the toilet and hit her head. Pt has a hx of frequent falls according to nursing facility. No alleviating factors noted. Pt denies any pain or any other associated symptoms. No other complaints noted.   Past Medical History:  Diagnosis Date  . Acute cystitis   . Anemia   . Anxiety   . Aortic stenosis, mild 11/18/2013  . Asthma   . Back pain   . Bronchitis, acute   . Carotid artery occlusion   . Cerebrovascular disease   . COPD (chronic obstructive pulmonary disease) ([place])   . Depression   . Diverticulosis of colon   . DJD (degenerative joint disease)   . Fall 07/05/2015  . GERD (gastroesophageal reflux disease)   . History of sudden visual loss   . Hyperlipidemia   . Hypertension   . Hypothyroidism   . Pneumonia 2015   hx of x 2   . Pulmonary hypertension, moderate to severe 11/19/2014  . Shortness of breath    with exertion   . 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 Transient ischemic attack   . UTI (lower urinary tract infection)    hx of     Patient Active Problem List   Diagnosis Date Noted  . Acute encephalopathy 10/04/2016  . UTI (lower urinary tract infection) 10/17/2015  . Dementia in Alzheimer's disease 10/17/2015  . Encephalopathy, metabolic 67/34/1935  . Contusion, hip  07/07/2015  . Falls frequently 07/07/2015  . Cervical radiculopathy 07/07/2015  . Anxiety and depression 07/07/2015  . Gait disturbance 07/07/2015  . Hyperglycemia 07/07/2015  . FTT (failure to thrive) in adult 07/07/2015  . Parkinson disease ([place])   . UTI (urinary tract infection) 07/05/2015  . Chronic neck pain 07/05/2015  . Debility 07/05/2015  . Fatigue 01/19/2015  . Dysuria 01/19/2015  . Chronic restrictive lung disease 01/04/2015  . DOE (dyspnea on exertion) 11/19/2014  . Pulmonary hypertension, moderate to severe 11/19/2014  . Irregular heart beats 08/12/2014  . Aftercare following surgery of the circulatory system, [place] 04/19/2014  . Pain of left lower extremity-Knee 04/19/2014  . Weakness 02/11/2014  . Loss of weight 02/11/2014  . Valvular heart disease 01/19/2014  . Pulmonary hypertension 01/19/2014  . CAP (community acquired pneumonia) 01/08/2014  . Asthma with acute exacerbation 01/06/2014  . Fall 01/06/2014  . Aortic stenosis, mild 11/18/2013  . Preventative health care 11/18/2013  . Anemia, unspecified 11/18/2013  . Tremor 09/21/2013  . Dyspnea 05/18/2013  . Physical deconditioning 05/18/2013  . OA (osteoarthritis) of knee 11/17/2012  . Hyperlipidemia 11/04/2012  . Occlusion and stenosis of carotid artery without mention of cerebral infarction 03/06/2012  . ASTHMA 10/09/2010  . Sudden visual loss 01/10/2009  . TRANSIENT ISCHEMIC ATTACK 01/10/2009  . CEREBROVASCULAR DISEASE  11/19/2007  . BRONCHITIS, RECURRENT 11/19/2007  . DIVERTICULOSIS OF COLON 11/19/2007  . Hypothyroidism 08/15/2007  . Anxiety state 08/15/2007  . Depression 08/15/2007  . Essential hypertension 08/15/2007  . GERD 08/15/2007  . Osteoarthritis 08/15/2007  . BACK PAIN, LUMBAR 08/15/2007    Past Surgical History:  Procedure Laterality Date  . CARDIAC CATHETERIZATION  02/18/14  . CARDIOTHORACIC PROCEDURE  01/13/2009   Right   . COLECTOMY    . ENDARTERECTOMY     right carotid  endartarectomy - 2010  . FOOT SURGERY     nerve cut between toes   . LEFT AND RIGHT HEART CATHETERIZATION WITH CORONARY ANGIOGRAM N/A 02/18/2014   Procedure: LEFT AND RIGHT HEART CATHETERIZATION WITH CORONARY ANGIOGRAM;  Surgeon: Larey Dresser, MD;  Location: [facility] CATH LAB;  Service: Cardiovascular;  Laterality: N/A;  . REPLACEMENT TOTAL KNEE     right and left knee  . TEE WITHOUT CARDIOVERSION N/A 02/08/2014   Procedure: TRANSESOPHAGEAL ECHOCARDIOGRAM (TEE);  Surgeon: Larey Dresser, MD;  Location: [facility];  Service: Cardiovascular;  Laterality: N/A;  . TOTAL KNEE ARTHROPLASTY Left 11/17/2012   Procedure: TOTAL KNEE ARTHROPLASTY;  Surgeon: Gearlean Alf, MD;  Location: [facility] OR[facility];  Service: Orthopedics;  Laterality: Left;  . TUBAL LIGATION    . VAGINAL HYSTERECTOMY      OB History    No data available       Home Medications    Prior to Admission medications   Medication Sig Start Date End Date Taking? Authorizing Provider  ADVAIR HFA 230-21 MCG/ACT inhaler Inhale 2 puffs into the lungs 2 (two) times daily. Patient taking differently: Inhale 2 puffs into the lungs daily.  11/23/14   Biagio Borg, MD  amLODipine (NORVASC) 5 MG tablet Take 1 tablet (5 mg total) by mouth daily. Must keep appt for future refills Patient taking differently: Take 5 mg by mouth daily.  10/03/15   Biagio Borg, MD  aspirin 81 MG chewable tablet Chew 81 mg by mouth daily.    Historical Provider, MD  baclofen (LIORESAL) 10 MG tablet Take 5 mg by mouth every 8 (eight) hours as needed (spasms).  10/06/15   Historical Provider, MD  benzonatate (TESSALON) 100 MG capsule Take 100 mg by mouth 3 (three) times daily as needed for cough.     Historical Provider, MD  bisacodyl (DULCOLAX) 5 MG EC tablet Take 1 tablet (5 mg total) by mouth daily as needed for moderate constipation. 10/20/15   Annita Brod, MD  calcium carbonate (OS-CAL - DOSED IN MG OF ELEMENTAL CALCIUM) 1250 (500 Ca) MG tablet Take 2 tablets by mouth  every 12 (twelve) hours as needed (for indigestion).    Historical Provider, MD  carbidopa-levodopa (SINEMET IR) 25-100 MG tablet Take 2 tablets by mouth 3 (three) times daily. for parkinson's disease    Historical Provider, MD  Cranberry 250 MG CAPS Take 250 mg by mouth 2 (two) times daily. For UTI prevention    Historical Provider, MD  diazepam (VALIUM) 2 MG tablet Take 1 tablet (2 mg total) by mouth 2 (two) times daily. 10/05/16   Bonnielee Haff, MD  divalproex (DEPAKOTE SPRINKLE) 125 MG capsule Take 125 mg by mouth 2 (two) times daily. Do not crush - for anxiety and mood    Historical Provider, MD  escitalopram (LEXAPRO) 20 MG tablet Take 20 mg by mouth daily.    Historical Provider, MD  ferrous sulfate 325 (65 FE) MG tablet Take 325 mg by mouth daily  with breakfast. Do not crush    Historical Provider, MD  fish oil-omega-3 fatty acids 1000 MG capsule Take 1 g by mouth daily.     Historical Provider, MD  fluticasone (FLONASE) 50 MCG/ACT nasal spray Place 2 sprays into both nostrils daily as needed for allergies or rhinitis.     Historical Provider, MD  guaifenesin (ROBAFEN) 100 MG/5ML syrup Take 300 mg by mouth 3 (three) times daily as needed for cough.     Historical Provider, MD  Infant Care Products [facility]) CREA Apply topically daily as needed (for buttocks).    Historical Provider, MD  levothyroxine (SYNTHROID) 88 MCG tablet Take 88 mcg by mouth daily with breakfast.     Historical Provider, MD  LORazepam (ATIVAN) 0.5 MG tablet Take 1 tablet (0.5 mg total) by mouth every 12 (twelve) hours as needed for anxiety. 10/05/16   Bonnielee Haff, MD  meloxicam (MOBIC) 15 MG tablet Take 15 mg by mouth daily as needed for pain.     Historical Provider, MD  Menthol, Topical Analgesic, (BIOFREEZE) 4 % GEL Apply topically every 6 (six) hours as needed (for pain).    Historical Provider, MD  omeprazole (PRILOSEC) 20 MG capsule Take 1 capsule (20 mg total) by mouth daily. 08/12/14   Biagio Borg, MD  Oyster  Shell 500 MG TABS Take 500 mg by mouth 2 (two) times daily.     Historical Provider, MD  pravastatin (PRAVACHOL) 20 MG tablet Take 1 tablet (20 mg total) by mouth daily at 6 PM. Patient taking differently: Take 20 mg by mouth every evening.  07/09/15   Kelvin Cellar, MD  sucralfate (CARAFATE) 1 g tablet Take 1 g by mouth every evening.    Historical Provider, MD  traMADol (ULTRAM) 50 MG tablet Take 1 tablet (50 mg total) by mouth every 6 (six) hours as needed for moderate pain. 10/05/16   Bonnielee Haff, MD    Family History Family History  Problem Relation Age of Onset  . Emphysema Father   . Heart disease Mother   . Hyperlipidemia Mother   . Hypertension Mother   . Coronary artery disease Sister   . Heart disease Sister   . Hypertension Sister   . Varicose Veins Sister   . AAA (abdominal aortic aneurysm) Sister   . Rheumatic fever Brother     x2  . Heart attack Brother     Social History Social History  Substance Use Topics  . Smoking status: Never Smoker  . Smokeless tobacco: Never Used  . Alcohol use No     Allergies   Relafen [nabumetone]; Lipitor [atorvastatin]; Other; and Penicillins   Review of Systems Review of Systems  Constitutional: Negative for chills and fever.  HENT: Negative for congestion and rhinorrhea.   Eyes: Negative for redness and visual disturbance.  Respiratory: Negative for shortness of breath and wheezing.   Cardiovascular: Negative for chest pain and palpitations.  Gastrointestinal: Negative for nausea and vomiting.  Genitourinary: Negative for dysuria and urgency.  Musculoskeletal: Negative for arthralgias and myalgias.  Skin: Positive for color change. Negative for pallor and wound.  Neurological: Negative for dizziness and headaches.   Physical Exam Updated Vital Signs BP (!) 101/49 (BP Location: Right Arm)   Pulse 67   Temp 98.9 F (37.2 C) (Oral)   Resp 16   Ht 5\' 5"  (1.651 m)   Wt 150 lb (68 kg)   SpO2 92%   BMI 24.96 kg/m    Physical Exam  Constitutional:  She is oriented to person, place, and time. She appears well-developed and well-nourished. No distress.  HENT:  Bruising around left eye that appears to be old with discoloration. Sutures present over left eyebrow region. No other areas with palpable tenderness noted   Eyes: EOM are normal. Pupils are equal, round, and reactive to light.  Neck: Normal range of motion. Neck supple.  Cardiovascular: Normal rate and regular rhythm.  Exam reveals no gallop and no friction rub.   No murmur heard. Pulmonary/Chest: Effort normal. She has no wheezes. She has no rales.  Abdominal: Soft. She exhibits no distension. There is no tenderness.  Musculoskeletal: She exhibits no edema or tenderness.  Neurological: She is alert and oriented to person, place, and time.  Skin: Skin is warm and dry. She is not diaphoretic.  Psychiatric: She has a normal mood and affect. Her behavior is normal.  Nursing note and vitals reviewed.  ED Treatments / Results  DIAGNOSTIC STUDIES:  Oxygen Saturation is 92% on RA, low by my interpretation.    COORDINATION OF CARE:  10:20 PM Discussed treatment plan with pt at bedside and pt agreed to plan.  Labs (all labs ordered are listed, but only abnormal results are displayed) Labs Reviewed - No data to display  EKG  EKG Interpretation None       Radiology Ct Head Wo Contrast  Result Date: 10/14/2016 CLINICAL DATA:  82 year old female with fall and head injury. EXAM: CT HEAD WITHOUT CONTRAST TECHNIQUE: Contiguous axial images were obtained from the base of the skull through the vertex without intravenous contrast. COMPARISON:  Head CT dated 10/05/2016 FINDINGS: Brain: There is moderate age-related atrophy and chronic microvascular ischemic changes. There is no acute intracranial hemorrhage. No mass effect or midline shift noted. No extra-axial fluid collection. Vascular: No hyperdense vessel or unexpected calcification. Skull: Normal.  Negative for fracture or focal lesion. Sinuses/Orbits: No acute finding. Other: Small left forehead hematoma. IMPRESSION: 1. No acute intracranial hemorrhage. 2. Moderate age-related atrophy and chronic microvascular ischemic changes. Electronically Signed   By: Anner Crete M.D.   On: 10/14/2016 23:18    Procedures Procedures (including critical care time)  Medications Ordered in ED Medications - No data to display   Initial Impression / Assessment and Plan / ED Course  I have reviewed the triage vital signs and the nursing notes.  Pertinent labs & imaging results that were available during my care of the patient were reviewed by me and considered in my medical decision making (see chart for details).     82 yo F with a cc of a fall.  The patient has multiple falls [place] while getting up from the toilet. Per observers she had struck her head when she fell. Patient has no complaints on arrival. CT of the head was negative.  Final Clinical Impressions(s) / ED Diagnoses   Final diagnoses:  Fall, initial encounter    New Prescriptions New Prescriptions   No medications on file  I personally performed the services described in this documentation, which was scribed in my presence. The recorded information has been reviewed and is accurate.      Deno Etienne, DO 10/14/16 2337

## 2016-10-15 DIAGNOSIS — S8990XA Unspecified injury of unspecified lower leg, initial encounter: Secondary | ICD-10-CM | POA: Diagnosis not present

## 2016-10-15 DIAGNOSIS — F418 Other specified anxiety disorders: Secondary | ICD-10-CM | POA: Diagnosis not present

## 2016-10-15 DIAGNOSIS — W19XXXS Unspecified fall, sequela: Secondary | ICD-10-CM | POA: Diagnosis not present

## 2016-10-15 DIAGNOSIS — S0990XA Unspecified injury of head, initial encounter: Secondary | ICD-10-CM | POA: Diagnosis not present

## 2016-10-15 DIAGNOSIS — M545 Low back pain: Secondary | ICD-10-CM | POA: Diagnosis not present

## 2016-10-15 DIAGNOSIS — G20 Parkinson's disease: Secondary | ICD-10-CM | POA: Diagnosis not present

## 2016-10-15 DIAGNOSIS — J449 Chronic obstructive pulmonary disease, unspecified: Secondary | ICD-10-CM | POA: Diagnosis not present

## 2016-10-15 DIAGNOSIS — R1311 Dysphagia, oral phase: Secondary | ICD-10-CM | POA: Diagnosis not present

## 2016-10-16 DIAGNOSIS — J449 Chronic obstructive pulmonary disease, unspecified: Secondary | ICD-10-CM | POA: Diagnosis not present

## 2016-10-16 DIAGNOSIS — W19XXXS Unspecified fall, sequela: Secondary | ICD-10-CM | POA: Diagnosis not present

## 2016-10-16 DIAGNOSIS — F418 Other specified anxiety disorders: Secondary | ICD-10-CM | POA: Diagnosis not present

## 2016-10-16 DIAGNOSIS — M545 Low back pain: Secondary | ICD-10-CM | POA: Diagnosis not present

## 2016-10-16 DIAGNOSIS — G20 Parkinson's disease: Secondary | ICD-10-CM | POA: Diagnosis not present

## 2016-10-16 DIAGNOSIS — R1311 Dysphagia, oral phase: Secondary | ICD-10-CM | POA: Diagnosis not present

## 2016-10-17 DIAGNOSIS — F418 Other specified anxiety disorders: Secondary | ICD-10-CM | POA: Diagnosis not present

## 2016-10-17 DIAGNOSIS — J449 Chronic obstructive pulmonary disease, unspecified: Secondary | ICD-10-CM | POA: Diagnosis not present

## 2016-10-17 DIAGNOSIS — W19XXXS Unspecified fall, sequela: Secondary | ICD-10-CM | POA: Diagnosis not present

## 2016-10-17 DIAGNOSIS — M545 Low back pain: Secondary | ICD-10-CM | POA: Diagnosis not present

## 2016-10-17 DIAGNOSIS — G20 Parkinson's disease: Secondary | ICD-10-CM | POA: Diagnosis not present

## 2016-10-17 DIAGNOSIS — R1311 Dysphagia, oral phase: Secondary | ICD-10-CM | POA: Diagnosis not present

## 2016-10-19 DIAGNOSIS — R1311 Dysphagia, oral phase: Secondary | ICD-10-CM | POA: Diagnosis not present

## 2016-10-19 DIAGNOSIS — M545 Low back pain: Secondary | ICD-10-CM | POA: Diagnosis not present

## 2016-10-19 DIAGNOSIS — J449 Chronic obstructive pulmonary disease, unspecified: Secondary | ICD-10-CM | POA: Diagnosis not present

## 2016-10-19 DIAGNOSIS — W19XXXS Unspecified fall, sequela: Secondary | ICD-10-CM | POA: Diagnosis not present

## 2016-10-19 DIAGNOSIS — G20 Parkinson's disease: Secondary | ICD-10-CM | POA: Diagnosis not present

## 2016-10-19 DIAGNOSIS — F418 Other specified anxiety disorders: Secondary | ICD-10-CM | POA: Diagnosis not present

## 2016-10-22 DIAGNOSIS — G20 Parkinson's disease: Secondary | ICD-10-CM | POA: Diagnosis not present

## 2016-10-22 DIAGNOSIS — W19XXXS Unspecified fall, sequela: Secondary | ICD-10-CM | POA: Diagnosis not present

## 2016-10-22 DIAGNOSIS — J449 Chronic obstructive pulmonary disease, unspecified: Secondary | ICD-10-CM | POA: Diagnosis not present

## 2016-10-22 DIAGNOSIS — F418 Other specified anxiety disorders: Secondary | ICD-10-CM | POA: Diagnosis not present

## 2016-10-22 DIAGNOSIS — R1311 Dysphagia, oral phase: Secondary | ICD-10-CM | POA: Diagnosis not present

## 2016-10-22 DIAGNOSIS — M545 Low back pain: Secondary | ICD-10-CM | POA: Diagnosis not present

## 2016-10-23 DIAGNOSIS — R269 Unspecified abnormalities of gait and mobility: Secondary | ICD-10-CM | POA: Diagnosis not present

## 2016-10-23 DIAGNOSIS — R233 Spontaneous ecchymoses: Secondary | ICD-10-CM | POA: Diagnosis not present

## 2016-10-23 DIAGNOSIS — G20 Parkinson's disease: Secondary | ICD-10-CM | POA: Diagnosis not present

## 2016-10-23 DIAGNOSIS — R0602 Shortness of breath: Secondary | ICD-10-CM | POA: Diagnosis not present

## 2016-10-23 DIAGNOSIS — M545 Low back pain: Secondary | ICD-10-CM | POA: Diagnosis not present

## 2016-10-23 DIAGNOSIS — I10 Essential (primary) hypertension: Secondary | ICD-10-CM | POA: Diagnosis not present

## 2016-10-23 DIAGNOSIS — R1311 Dysphagia, oral phase: Secondary | ICD-10-CM | POA: Diagnosis not present

## 2016-10-23 DIAGNOSIS — W19XXXS Unspecified fall, sequela: Secondary | ICD-10-CM | POA: Diagnosis not present

## 2016-10-23 DIAGNOSIS — R1319 Other dysphagia: Secondary | ICD-10-CM | POA: Diagnosis not present

## 2016-10-23 DIAGNOSIS — K219 Gastro-esophageal reflux disease without esophagitis: Secondary | ICD-10-CM | POA: Diagnosis not present

## 2016-10-23 DIAGNOSIS — J449 Chronic obstructive pulmonary disease, unspecified: Secondary | ICD-10-CM | POA: Diagnosis not present

## 2016-10-23 DIAGNOSIS — F418 Other specified anxiety disorders: Secondary | ICD-10-CM | POA: Diagnosis not present

## 2016-10-23 DIAGNOSIS — E039 Hypothyroidism, unspecified: Secondary | ICD-10-CM | POA: Diagnosis not present

## 2016-10-25 DIAGNOSIS — W19XXXS Unspecified fall, sequela: Secondary | ICD-10-CM | POA: Diagnosis not present

## 2016-10-25 DIAGNOSIS — G20 Parkinson's disease: Secondary | ICD-10-CM | POA: Diagnosis not present

## 2016-10-25 DIAGNOSIS — R1311 Dysphagia, oral phase: Secondary | ICD-10-CM | POA: Diagnosis not present

## 2016-10-25 DIAGNOSIS — F418 Other specified anxiety disorders: Secondary | ICD-10-CM | POA: Diagnosis not present

## 2016-10-25 DIAGNOSIS — M545 Low back pain: Secondary | ICD-10-CM | POA: Diagnosis not present

## 2016-10-25 DIAGNOSIS — J449 Chronic obstructive pulmonary disease, unspecified: Secondary | ICD-10-CM | POA: Diagnosis not present

## 2016-10-26 DIAGNOSIS — K2970 Gastritis, unspecified, without bleeding: Secondary | ICD-10-CM | POA: Diagnosis not present

## 2016-10-26 DIAGNOSIS — D649 Anemia, unspecified: Secondary | ICD-10-CM | POA: Diagnosis not present

## 2016-10-26 DIAGNOSIS — J9811 Atelectasis: Secondary | ICD-10-CM | POA: Diagnosis not present

## 2016-10-26 DIAGNOSIS — K8020 Calculus of gallbladder without cholecystitis without obstruction: Secondary | ICD-10-CM | POA: Diagnosis not present

## 2016-10-26 DIAGNOSIS — J449 Chronic obstructive pulmonary disease, unspecified: Secondary | ICD-10-CM | POA: Diagnosis not present

## 2016-10-26 DIAGNOSIS — I10 Essential (primary) hypertension: Secondary | ICD-10-CM | POA: Diagnosis not present

## 2016-10-26 DIAGNOSIS — K5730 Diverticulosis of large intestine without perforation or abscess without bleeding: Secondary | ICD-10-CM | POA: Diagnosis not present

## 2016-10-26 DIAGNOSIS — R197 Diarrhea, unspecified: Secondary | ICD-10-CM | POA: Diagnosis not present

## 2016-10-26 DIAGNOSIS — E860 Dehydration: Secondary | ICD-10-CM | POA: Diagnosis not present

## 2016-10-26 DIAGNOSIS — R109 Unspecified abdominal pain: Secondary | ICD-10-CM | POA: Diagnosis not present

## 2016-10-26 DIAGNOSIS — A084 Viral intestinal infection, unspecified: Secondary | ICD-10-CM | POA: Diagnosis not present

## 2016-10-26 DIAGNOSIS — K922 Gastrointestinal hemorrhage, unspecified: Secondary | ICD-10-CM | POA: Diagnosis not present

## 2016-10-26 DIAGNOSIS — R110 Nausea: Secondary | ICD-10-CM | POA: Diagnosis not present

## 2016-10-26 DIAGNOSIS — G20 Parkinson's disease: Secondary | ICD-10-CM | POA: Diagnosis not present

## 2016-10-26 DIAGNOSIS — K8050 Calculus of bile duct without cholangitis or cholecystitis without obstruction: Secondary | ICD-10-CM | POA: Diagnosis not present

## 2016-10-27 DIAGNOSIS — R109 Unspecified abdominal pain: Secondary | ICD-10-CM | POA: Diagnosis not present

## 2016-10-27 DIAGNOSIS — M6281 Muscle weakness (generalized): Secondary | ICD-10-CM | POA: Diagnosis not present

## 2016-10-27 DIAGNOSIS — N200 Calculus of kidney: Secondary | ICD-10-CM | POA: Diagnosis not present

## 2016-10-27 DIAGNOSIS — J449 Chronic obstructive pulmonary disease, unspecified: Secondary | ICD-10-CM | POA: Diagnosis not present

## 2016-10-27 DIAGNOSIS — K2970 Gastritis, unspecified, without bleeding: Secondary | ICD-10-CM | POA: Diagnosis not present

## 2016-10-27 DIAGNOSIS — K8020 Calculus of gallbladder without cholecystitis without obstruction: Secondary | ICD-10-CM | POA: Diagnosis not present

## 2016-10-27 DIAGNOSIS — F0390 Unspecified dementia without behavioral disturbance: Secondary | ICD-10-CM | POA: Diagnosis present

## 2016-10-27 DIAGNOSIS — K449 Diaphragmatic hernia without obstruction or gangrene: Secondary | ICD-10-CM | POA: Diagnosis present

## 2016-10-27 DIAGNOSIS — K529 Noninfective gastroenteritis and colitis, unspecified: Secondary | ICD-10-CM | POA: Diagnosis not present

## 2016-10-27 DIAGNOSIS — Z7951 Long term (current) use of inhaled steroids: Secondary | ICD-10-CM | POA: Diagnosis not present

## 2016-10-27 DIAGNOSIS — R112 Nausea with vomiting, unspecified: Secondary | ICD-10-CM | POA: Diagnosis not present

## 2016-10-27 DIAGNOSIS — A084 Viral intestinal infection, unspecified: Secondary | ICD-10-CM | POA: Diagnosis present

## 2016-10-27 DIAGNOSIS — Z79891 Long term (current) use of opiate analgesic: Secondary | ICD-10-CM | POA: Diagnosis not present

## 2016-10-27 DIAGNOSIS — R1311 Dysphagia, oral phase: Secondary | ICD-10-CM | POA: Diagnosis not present

## 2016-10-27 DIAGNOSIS — Z880 Allergy status to penicillin: Secondary | ICD-10-CM | POA: Diagnosis not present

## 2016-10-27 DIAGNOSIS — R1084 Generalized abdominal pain: Secondary | ICD-10-CM | POA: Diagnosis not present

## 2016-10-27 DIAGNOSIS — E876 Hypokalemia: Secondary | ICD-10-CM | POA: Diagnosis not present

## 2016-10-27 DIAGNOSIS — R1312 Dysphagia, oropharyngeal phase: Secondary | ICD-10-CM | POA: Diagnosis not present

## 2016-10-27 DIAGNOSIS — S0003XA Contusion of scalp, initial encounter: Secondary | ICD-10-CM | POA: Diagnosis not present

## 2016-10-27 DIAGNOSIS — G20 Parkinson's disease: Secondary | ICD-10-CM | POA: Diagnosis present

## 2016-10-27 DIAGNOSIS — I10 Essential (primary) hypertension: Secondary | ICD-10-CM | POA: Diagnosis not present

## 2016-10-27 DIAGNOSIS — R278 Other lack of coordination: Secondary | ICD-10-CM | POA: Diagnosis not present

## 2016-10-27 DIAGNOSIS — K5730 Diverticulosis of large intestine without perforation or abscess without bleeding: Secondary | ICD-10-CM | POA: Diagnosis not present

## 2016-10-27 DIAGNOSIS — D649 Anemia, unspecified: Secondary | ICD-10-CM | POA: Diagnosis not present

## 2016-10-27 DIAGNOSIS — E039 Hypothyroidism, unspecified: Secondary | ICD-10-CM | POA: Diagnosis not present

## 2016-10-27 DIAGNOSIS — R197 Diarrhea, unspecified: Secondary | ICD-10-CM | POA: Diagnosis not present

## 2016-10-27 DIAGNOSIS — R41841 Cognitive communication deficit: Secondary | ICD-10-CM | POA: Diagnosis not present

## 2016-10-27 DIAGNOSIS — R1310 Dysphagia, unspecified: Secondary | ICD-10-CM | POA: Diagnosis present

## 2016-10-27 DIAGNOSIS — K219 Gastro-esophageal reflux disease without esophagitis: Secondary | ICD-10-CM | POA: Diagnosis not present

## 2016-10-27 DIAGNOSIS — R2681 Unsteadiness on feet: Secondary | ICD-10-CM | POA: Diagnosis not present

## 2016-10-27 DIAGNOSIS — K922 Gastrointestinal hemorrhage, unspecified: Secondary | ICD-10-CM | POA: Diagnosis not present

## 2016-10-27 DIAGNOSIS — E860 Dehydration: Secondary | ICD-10-CM | POA: Diagnosis present

## 2016-10-27 DIAGNOSIS — J9811 Atelectasis: Secondary | ICD-10-CM | POA: Diagnosis not present

## 2016-11-01 DIAGNOSIS — G309 Alzheimer's disease, unspecified: Secondary | ICD-10-CM | POA: Diagnosis present

## 2016-11-01 DIAGNOSIS — R627 Adult failure to thrive: Secondary | ICD-10-CM | POA: Diagnosis present

## 2016-11-01 DIAGNOSIS — H5462 Unqualified visual loss, left eye, normal vision right eye: Secondary | ICD-10-CM | POA: Diagnosis present

## 2016-11-01 DIAGNOSIS — K529 Noninfective gastroenteritis and colitis, unspecified: Secondary | ICD-10-CM | POA: Diagnosis not present

## 2016-11-01 DIAGNOSIS — I700 Atherosclerosis of aorta: Secondary | ICD-10-CM | POA: Diagnosis not present

## 2016-11-01 DIAGNOSIS — N3000 Acute cystitis without hematuria: Secondary | ICD-10-CM | POA: Diagnosis not present

## 2016-11-01 DIAGNOSIS — G9340 Encephalopathy, unspecified: Secondary | ICD-10-CM | POA: Diagnosis not present

## 2016-11-01 DIAGNOSIS — N179 Acute kidney failure, unspecified: Secondary | ICD-10-CM | POA: Diagnosis not present

## 2016-11-01 DIAGNOSIS — R41841 Cognitive communication deficit: Secondary | ICD-10-CM | POA: Diagnosis not present

## 2016-11-01 DIAGNOSIS — N390 Urinary tract infection, site not specified: Secondary | ICD-10-CM | POA: Diagnosis not present

## 2016-11-01 DIAGNOSIS — G20 Parkinson's disease: Secondary | ICD-10-CM | POA: Diagnosis not present

## 2016-11-01 DIAGNOSIS — R1312 Dysphagia, oropharyngeal phase: Secondary | ICD-10-CM | POA: Diagnosis not present

## 2016-11-01 DIAGNOSIS — N289 Disorder of kidney and ureter, unspecified: Secondary | ICD-10-CM | POA: Diagnosis present

## 2016-11-01 DIAGNOSIS — Y92129 Unspecified place in nursing home as the place of occurrence of the external cause: Secondary | ICD-10-CM | POA: Diagnosis not present

## 2016-11-01 DIAGNOSIS — F0390 Unspecified dementia without behavioral disturbance: Secondary | ICD-10-CM | POA: Diagnosis not present

## 2016-11-01 DIAGNOSIS — R0902 Hypoxemia: Secondary | ICD-10-CM | POA: Diagnosis present

## 2016-11-01 DIAGNOSIS — R2689 Other abnormalities of gait and mobility: Secondary | ICD-10-CM | POA: Diagnosis not present

## 2016-11-01 DIAGNOSIS — F0280 Dementia in other diseases classified elsewhere without behavioral disturbance: Secondary | ICD-10-CM | POA: Diagnosis present

## 2016-11-01 DIAGNOSIS — I272 Pulmonary hypertension, unspecified: Secondary | ICD-10-CM | POA: Diagnosis present

## 2016-11-01 DIAGNOSIS — Z6823 Body mass index (BMI) 23.0-23.9, adult: Secondary | ICD-10-CM | POA: Diagnosis not present

## 2016-11-01 DIAGNOSIS — K2970 Gastritis, unspecified, without bleeding: Secondary | ICD-10-CM | POA: Diagnosis not present

## 2016-11-01 DIAGNOSIS — R401 Stupor: Secondary | ICD-10-CM | POA: Diagnosis not present

## 2016-11-01 DIAGNOSIS — M6281 Muscle weakness (generalized): Secondary | ICD-10-CM | POA: Diagnosis not present

## 2016-11-01 DIAGNOSIS — R1319 Other dysphagia: Secondary | ICD-10-CM | POA: Diagnosis not present

## 2016-11-01 DIAGNOSIS — I350 Nonrheumatic aortic (valve) stenosis: Secondary | ICD-10-CM | POA: Diagnosis present

## 2016-11-01 DIAGNOSIS — Z66 Do not resuscitate: Secondary | ICD-10-CM | POA: Diagnosis not present

## 2016-11-01 DIAGNOSIS — R296 Repeated falls: Secondary | ICD-10-CM | POA: Diagnosis present

## 2016-11-01 DIAGNOSIS — B9689 Other specified bacterial agents as the cause of diseases classified elsewhere: Secondary | ICD-10-CM | POA: Diagnosis present

## 2016-11-01 DIAGNOSIS — F329 Major depressive disorder, single episode, unspecified: Secondary | ICD-10-CM | POA: Diagnosis present

## 2016-11-01 DIAGNOSIS — R2681 Unsteadiness on feet: Secondary | ICD-10-CM | POA: Diagnosis not present

## 2016-11-01 DIAGNOSIS — R402441 Other coma, without documented Glasgow coma scale score, or with partial score reported, in the field [EMT or ambulance]: Secondary | ICD-10-CM | POA: Diagnosis not present

## 2016-11-01 DIAGNOSIS — R278 Other lack of coordination: Secondary | ICD-10-CM | POA: Diagnosis not present

## 2016-11-01 DIAGNOSIS — W19XXXA Unspecified fall, initial encounter: Secondary | ICD-10-CM | POA: Diagnosis present

## 2016-11-01 DIAGNOSIS — R1311 Dysphagia, oral phase: Secondary | ICD-10-CM | POA: Diagnosis not present

## 2016-11-01 DIAGNOSIS — I6529 Occlusion and stenosis of unspecified carotid artery: Secondary | ICD-10-CM | POA: Diagnosis present

## 2016-11-01 DIAGNOSIS — E785 Hyperlipidemia, unspecified: Secondary | ICD-10-CM | POA: Diagnosis present

## 2016-11-01 DIAGNOSIS — R1084 Generalized abdominal pain: Secondary | ICD-10-CM | POA: Diagnosis not present

## 2016-11-01 DIAGNOSIS — N183 Chronic kidney disease, stage 3 (moderate): Secondary | ICD-10-CM | POA: Diagnosis not present

## 2016-11-01 DIAGNOSIS — J9811 Atelectasis: Secondary | ICD-10-CM | POA: Diagnosis present

## 2016-11-01 DIAGNOSIS — E860 Dehydration: Secondary | ICD-10-CM | POA: Diagnosis not present

## 2016-11-01 DIAGNOSIS — E43 Unspecified severe protein-calorie malnutrition: Secondary | ICD-10-CM | POA: Diagnosis present

## 2016-11-01 DIAGNOSIS — K219 Gastro-esophageal reflux disease without esophagitis: Secondary | ICD-10-CM | POA: Diagnosis not present

## 2016-11-01 DIAGNOSIS — R4020 Unspecified coma: Secondary | ICD-10-CM | POA: Diagnosis not present

## 2016-11-01 DIAGNOSIS — E039 Hypothyroidism, unspecified: Secondary | ICD-10-CM | POA: Diagnosis not present

## 2016-11-01 DIAGNOSIS — K5730 Diverticulosis of large intestine without perforation or abscess without bleeding: Secondary | ICD-10-CM | POA: Diagnosis present

## 2016-11-01 DIAGNOSIS — S0990XA Unspecified injury of head, initial encounter: Secondary | ICD-10-CM | POA: Diagnosis not present

## 2016-11-04 ENCOUNTER — Emergency Department (HOSPITAL_COMMUNITY): Payer: Medicare Other

## 2016-11-04 ENCOUNTER — Inpatient Hospital Stay (HOSPITAL_COMMUNITY)
Admission: EM | Admit: 2016-11-04 | Discharge: 2016-11-09 | DRG: 070 | Disposition: A | Discharge status: Skilled Nursing Facility | Payer: Medicare Other | Attending: Family Medicine | Admitting: Family Medicine

## 2016-11-04 ENCOUNTER — Encounter (HOSPITAL_COMMUNITY): Payer: Self-pay

## 2016-11-04 DIAGNOSIS — I6529 Occlusion and stenosis of unspecified carotid artery: Secondary | ICD-10-CM | POA: Diagnosis present

## 2016-11-04 DIAGNOSIS — H5462 Unqualified visual loss, left eye, normal vision right eye: Secondary | ICD-10-CM | POA: Diagnosis present

## 2016-11-04 DIAGNOSIS — N183 Chronic kidney disease, stage 3 unspecified: Secondary | ICD-10-CM | POA: Diagnosis present

## 2016-11-04 DIAGNOSIS — Y92129 Unspecified place in nursing home as the place of occurrence of the external cause: Secondary | ICD-10-CM | POA: Diagnosis not present

## 2016-11-04 DIAGNOSIS — K219 Gastro-esophageal reflux disease without esophagitis: Secondary | ICD-10-CM | POA: Diagnosis present

## 2016-11-04 DIAGNOSIS — F419 Anxiety disorder, unspecified: Secondary | ICD-10-CM | POA: Diagnosis present

## 2016-11-04 DIAGNOSIS — Z7951 Long term (current) use of inhaled steroids: Secondary | ICD-10-CM

## 2016-11-04 DIAGNOSIS — R401 Stupor: Secondary | ICD-10-CM | POA: Diagnosis not present

## 2016-11-04 DIAGNOSIS — R2689 Other abnormalities of gait and mobility: Secondary | ICD-10-CM | POA: Diagnosis not present

## 2016-11-04 DIAGNOSIS — J449 Chronic obstructive pulmonary disease, unspecified: Secondary | ICD-10-CM | POA: Diagnosis not present

## 2016-11-04 DIAGNOSIS — E039 Hypothyroidism, unspecified: Secondary | ICD-10-CM | POA: Diagnosis not present

## 2016-11-04 DIAGNOSIS — Z66 Do not resuscitate: Secondary | ICD-10-CM | POA: Diagnosis not present

## 2016-11-04 DIAGNOSIS — G309 Alzheimer's disease, unspecified: Secondary | ICD-10-CM | POA: Diagnosis present

## 2016-11-04 DIAGNOSIS — R1310 Dysphagia, unspecified: Secondary | ICD-10-CM | POA: Diagnosis not present

## 2016-11-04 DIAGNOSIS — I272 Pulmonary hypertension, unspecified: Secondary | ICD-10-CM | POA: Diagnosis present

## 2016-11-04 DIAGNOSIS — J9811 Atelectasis: Secondary | ICD-10-CM | POA: Diagnosis present

## 2016-11-04 DIAGNOSIS — F329 Major depressive disorder, single episode, unspecified: Secondary | ICD-10-CM | POA: Diagnosis present

## 2016-11-04 DIAGNOSIS — W19XXXA Unspecified fall, initial encounter: Secondary | ICD-10-CM | POA: Diagnosis present

## 2016-11-04 DIAGNOSIS — Z96653 Presence of artificial knee joint, bilateral: Secondary | ICD-10-CM

## 2016-11-04 DIAGNOSIS — T17908A Unspecified foreign body in respiratory tract, part unspecified causing other injury, initial encounter: Secondary | ICD-10-CM

## 2016-11-04 DIAGNOSIS — G9341 Metabolic encephalopathy: Secondary | ICD-10-CM | POA: Diagnosis not present

## 2016-11-04 DIAGNOSIS — R2681 Unsteadiness on feet: Secondary | ICD-10-CM | POA: Diagnosis not present

## 2016-11-04 DIAGNOSIS — R0602 Shortness of breath: Secondary | ICD-10-CM | POA: Diagnosis not present

## 2016-11-04 DIAGNOSIS — R402441 Other coma, without documented Glasgow coma scale score, or with partial score reported, in the field [EMT or ambulance]: Secondary | ICD-10-CM | POA: Diagnosis not present

## 2016-11-04 DIAGNOSIS — F0280 Dementia in other diseases classified elsewhere without behavioral disturbance: Secondary | ICD-10-CM | POA: Diagnosis present

## 2016-11-04 DIAGNOSIS — R1311 Dysphagia, oral phase: Secondary | ICD-10-CM | POA: Diagnosis not present

## 2016-11-04 DIAGNOSIS — Z9181 History of falling: Secondary | ICD-10-CM

## 2016-11-04 DIAGNOSIS — R1312 Dysphagia, oropharyngeal phase: Secondary | ICD-10-CM | POA: Diagnosis not present

## 2016-11-04 DIAGNOSIS — R0902 Hypoxemia: Secondary | ICD-10-CM | POA: Diagnosis present

## 2016-11-04 DIAGNOSIS — N289 Disorder of kidney and ureter, unspecified: Secondary | ICD-10-CM | POA: Diagnosis present

## 2016-11-04 DIAGNOSIS — E860 Dehydration: Secondary | ICD-10-CM | POA: Diagnosis not present

## 2016-11-04 DIAGNOSIS — R4020 Unspecified coma: Secondary | ICD-10-CM | POA: Diagnosis not present

## 2016-11-04 DIAGNOSIS — R627 Adult failure to thrive: Secondary | ICD-10-CM | POA: Diagnosis present

## 2016-11-04 DIAGNOSIS — N390 Urinary tract infection, site not specified: Secondary | ICD-10-CM | POA: Diagnosis not present

## 2016-11-04 DIAGNOSIS — N3000 Acute cystitis without hematuria: Secondary | ICD-10-CM | POA: Diagnosis not present

## 2016-11-04 DIAGNOSIS — B9689 Other specified bacterial agents as the cause of diseases classified elsewhere: Secondary | ICD-10-CM | POA: Diagnosis present

## 2016-11-04 DIAGNOSIS — Z6823 Body mass index (BMI) 23.0-23.9, adult: Secondary | ICD-10-CM

## 2016-11-04 DIAGNOSIS — R1319 Other dysphagia: Secondary | ICD-10-CM | POA: Diagnosis not present

## 2016-11-04 DIAGNOSIS — G20 Parkinson's disease: Secondary | ICD-10-CM | POA: Diagnosis present

## 2016-11-04 DIAGNOSIS — I639 Cerebral infarction, unspecified: Secondary | ICD-10-CM | POA: Diagnosis present

## 2016-11-04 DIAGNOSIS — M1990 Unspecified osteoarthritis, unspecified site: Secondary | ICD-10-CM | POA: Diagnosis present

## 2016-11-04 DIAGNOSIS — Z7982 Long term (current) use of aspirin: Secondary | ICD-10-CM

## 2016-11-04 DIAGNOSIS — R278 Other lack of coordination: Secondary | ICD-10-CM | POA: Diagnosis not present

## 2016-11-04 DIAGNOSIS — F411 Generalized anxiety disorder: Secondary | ICD-10-CM | POA: Diagnosis present

## 2016-11-04 DIAGNOSIS — K529 Noninfective gastroenteritis and colitis, unspecified: Secondary | ICD-10-CM | POA: Diagnosis not present

## 2016-11-04 DIAGNOSIS — E43 Unspecified severe protein-calorie malnutrition: Secondary | ICD-10-CM | POA: Insufficient documentation

## 2016-11-04 DIAGNOSIS — E785 Hyperlipidemia, unspecified: Secondary | ICD-10-CM | POA: Diagnosis present

## 2016-11-04 DIAGNOSIS — K5730 Diverticulosis of large intestine without perforation or abscess without bleeding: Secondary | ICD-10-CM | POA: Diagnosis present

## 2016-11-04 DIAGNOSIS — M6281 Muscle weakness (generalized): Secondary | ICD-10-CM | POA: Diagnosis not present

## 2016-11-04 DIAGNOSIS — I350 Nonrheumatic aortic (valve) stenosis: Secondary | ICD-10-CM | POA: Diagnosis present

## 2016-11-04 DIAGNOSIS — R4182 Altered mental status, unspecified: Secondary | ICD-10-CM | POA: Diagnosis not present

## 2016-11-04 DIAGNOSIS — I10 Essential (primary) hypertension: Secondary | ICD-10-CM | POA: Diagnosis present

## 2016-11-04 DIAGNOSIS — Z888 Allergy status to other drugs, medicaments and biological substances status: Secondary | ICD-10-CM

## 2016-11-04 DIAGNOSIS — R296 Repeated falls: Secondary | ICD-10-CM | POA: Diagnosis present

## 2016-11-04 DIAGNOSIS — R41841 Cognitive communication deficit: Secondary | ICD-10-CM | POA: Diagnosis not present

## 2016-11-04 DIAGNOSIS — S0990XA Unspecified injury of head, initial encounter: Secondary | ICD-10-CM | POA: Diagnosis not present

## 2016-11-04 DIAGNOSIS — G9340 Encephalopathy, unspecified: Secondary | ICD-10-CM | POA: Diagnosis not present

## 2016-11-04 DIAGNOSIS — N179 Acute kidney failure, unspecified: Secondary | ICD-10-CM | POA: Diagnosis not present

## 2016-11-04 DIAGNOSIS — I69398 Other sequelae of cerebral infarction: Secondary | ICD-10-CM

## 2016-11-04 DIAGNOSIS — Z79899 Other long term (current) drug therapy: Secondary | ICD-10-CM

## 2016-11-04 DIAGNOSIS — E440 Moderate protein-calorie malnutrition: Secondary | ICD-10-CM | POA: Diagnosis not present

## 2016-11-04 DIAGNOSIS — S0083XA Contusion of other part of head, initial encounter: Secondary | ICD-10-CM | POA: Diagnosis present

## 2016-11-04 DIAGNOSIS — F32A Depression, unspecified: Secondary | ICD-10-CM | POA: Diagnosis present

## 2016-11-04 DIAGNOSIS — I700 Atherosclerosis of aorta: Secondary | ICD-10-CM | POA: Diagnosis not present

## 2016-11-04 DIAGNOSIS — G20A1 Parkinson's disease without dyskinesia, without mention of fluctuations: Secondary | ICD-10-CM | POA: Diagnosis present

## 2016-11-04 DIAGNOSIS — I129 Hypertensive chronic kidney disease with stage 1 through stage 4 chronic kidney disease, or unspecified chronic kidney disease: Secondary | ICD-10-CM | POA: Diagnosis present

## 2016-11-04 DIAGNOSIS — Z880 Allergy status to penicillin: Secondary | ICD-10-CM

## 2016-11-04 LAB — COMPREHENSIVE METABOLIC PANEL
ALBUMIN: 3 g/dL — AB (ref 3.5–5.0)
ALT: 6 U/L — ABNORMAL LOW (ref 14–54)
ANION GAP: 11 (ref 5–15)
AST: 14 U/L — AB (ref 15–41)
Alkaline Phosphatase: 54 U/L (ref 38–126)
BILIRUBIN TOTAL: 0.6 mg/dL (ref 0.3–1.2)
BUN: 16 mg/dL (ref 6–20)
CHLORIDE: 100 mmol/L — AB (ref 101–111)
CO2: 24 mmol/L (ref 22–32)
Calcium: 9.4 mg/dL (ref 8.9–10.3)
Creatinine, Ser: 1.36 mg/dL — ABNORMAL HIGH (ref 0.44–1.00)
GFR calc Af Amer: 42 mL/min — ABNORMAL LOW (ref >60)
GFR calc non Af Amer: 36 mL/min — ABNORMAL LOW (ref >60)
GLUCOSE: 93 mg/dL (ref 65–99)
POTASSIUM: 3.9 mmol/L (ref 3.5–5.1)
Sodium: 135 mmol/L (ref 135–145)
Total Protein: 6.9 g/dL (ref 6.5–8.1)

## 2016-11-04 LAB — URINALYSIS, ROUTINE W REFLEX MICROSCOPIC
BILIRUBIN URINE: NEGATIVE
Glucose, UA: NEGATIVE mg/dL
KETONES UR: 5 mg/dL — AB
NITRITE: POSITIVE — AB
PROTEIN: 30 mg/dL — AB
Specific Gravity, Urine: 1.013 (ref 1.005–1.030)
pH: 6 (ref 5.0–8.0)

## 2016-11-04 LAB — CBC WITH DIFFERENTIAL/PLATELET
BASOS ABS: 0 10*3/uL (ref 0.0–0.1)
Basophils Relative: 0 %
EOS ABS: 0.1 10*3/uL (ref 0.0–0.7)
Eosinophils Relative: 1 %
HEMATOCRIT: 37.2 % (ref 36.0–46.0)
Hemoglobin: 12.9 g/dL (ref 12.0–15.0)
LYMPHS ABS: 2.3 10*3/uL (ref 0.7–4.0)
LYMPHS PCT: 37 %
MCH: 33 pg (ref 26.0–34.0)
MCHC: 34.7 g/dL (ref 30.0–36.0)
MCV: 95.1 fL (ref 78.0–100.0)
MONOS PCT: 14 %
Monocytes Absolute: 0.9 10*3/uL (ref 0.1–1.0)
NEUTROS ABS: 3 10*3/uL (ref 1.7–7.7)
Neutrophils Relative %: 48 %
Platelets: 319 10*3/uL (ref 150–400)
RBC: 3.91 MIL/uL (ref 3.87–5.11)
RDW: 13.2 % (ref 11.5–15.5)
WBC: 6.3 10*3/uL (ref 4.0–10.5)

## 2016-11-04 LAB — I-STAT TROPONIN, ED: Troponin i, poc: 0 ng/mL (ref 0.00–0.08)

## 2016-11-04 LAB — BRAIN NATRIURETIC PEPTIDE: B NATRIURETIC PEPTIDE 5: 48.6 pg/mL (ref 0.0–100.0)

## 2016-11-04 MED ORDER — ASPIRIN 300 MG RE SUPP
150.0000 mg | Freq: Every day | RECTAL | Status: DC
  Administered 2016-11-05 – 2016-11-09 (×5): 150 mg via RECTAL
  Filled 2016-11-04 (×5): qty 1

## 2016-11-04 MED ORDER — ONDANSETRON HCL 4 MG/2ML IJ SOLN: 4.0000 mg | Freq: Four times a day (QID) | INTRAMUSCULAR | Status: DC | PRN

## 2016-11-04 MED ORDER — IPRATROPIUM-ALBUTEROL 0.5-2.5 (3) MG/3ML IN SOLN
3.0000 mL | Freq: Two times a day (BID) | RESPIRATORY_TRACT | Status: DC
Start: 2016-11-05 — End: 2016-11-05
  Administered 2016-11-05 (×2): 3 mL via RESPIRATORY_TRACT
  Filled 2016-11-04 (×2): qty 3

## 2016-11-04 MED ORDER — DEXTROSE 5 % IV SOLN
1.0000 g | Freq: Once | INTRAVENOUS | Status: AC
  Administered 2016-11-04: 1 g via INTRAVENOUS
  Filled 2016-11-04: qty 10

## 2016-11-04 MED ORDER — LEVOTHYROXINE SODIUM 100 MCG IV SOLR
50.0000 ug | Freq: Every day | INTRAVENOUS | Status: DC
  Administered 2016-11-05 – 2016-11-06 (×2): 50 ug via INTRAVENOUS
  Filled 2016-11-04 (×2): qty 5

## 2016-11-04 MED ORDER — ACETAMINOPHEN 650 MG RE SUPP: 650.0000 mg | Freq: Four times a day (QID) | RECTAL | Status: DC | PRN

## 2016-11-04 MED ORDER — HYDRALAZINE HCL 20 MG/ML IJ SOLN: 5.0000 mg | INTRAMUSCULAR | Status: DC | PRN

## 2016-11-04 MED ORDER — ALBUTEROL SULFATE (2.5 MG/3ML) 0.083% IN NEBU: 2.5000 mg | INHALATION_SOLUTION | RESPIRATORY_TRACT | Status: DC | PRN

## 2016-11-04 MED ORDER — FAMOTIDINE IN NACL 20-0.9 MG/50ML-% IV SOLN
20.0000 mg | Freq: Two times a day (BID) | INTRAVENOUS | Status: DC
  Administered 2016-11-05 – 2016-11-07 (×6): 20 mg via INTRAVENOUS
  Filled 2016-11-04 (×7): qty 50

## 2016-11-04 MED ORDER — IPRATROPIUM-ALBUTEROL 0.5-2.5 (3) MG/3ML IN SOLN
3.0000 mL | RESPIRATORY_TRACT | Status: DC
  Administered 2016-11-04: 3 mL via RESPIRATORY_TRACT
  Filled 2016-11-04: qty 3

## 2016-11-04 MED ORDER — ACETAMINOPHEN 325 MG PO TABS
650.0000 mg | ORAL_TABLET | Freq: Four times a day (QID) | ORAL | Status: DC | PRN
  Administered 2016-11-08 – 2016-11-09 (×3): 650 mg via ORAL
  Filled 2016-11-04 (×4): qty 2

## 2016-11-04 MED ORDER — ONDANSETRON HCL 4 MG PO TABS: 4.0000 mg | ORAL_TABLET | Freq: Four times a day (QID) | ORAL | Status: DC | PRN

## 2016-11-04 MED ORDER — DEXTROSE 5 % IV SOLN
1.0000 g | INTRAVENOUS | Status: DC
  Administered 2016-11-05 – 2016-11-08 (×4): 1 g via INTRAVENOUS
  Filled 2016-11-04 (×5): qty 10

## 2016-11-04 MED ORDER — SODIUM CHLORIDE 0.9 % IV SOLN
INTRAVENOUS | Status: DC
  Administered 2016-11-05 (×2): 1000 mL via INTRAVENOUS

## 2016-11-04 MED ORDER — SODIUM CHLORIDE 0.9% FLUSH
3.0000 mL | Freq: Two times a day (BID) | INTRAVENOUS | Status: DC
  Administered 2016-11-05 – 2016-11-09 (×6): 3 mL via INTRAVENOUS

## 2016-11-04 MED ORDER — SODIUM CHLORIDE 0.9 % IV BOLUS (SEPSIS)
500.0000 mL | Freq: Once | INTRAVENOUS | Status: AC
  Administered 2016-11-04: 500 mL via INTRAVENOUS

## 2016-11-04 MED ORDER — ENOXAPARIN SODIUM 40 MG/0.4ML ~~LOC~~ SOLN
40.0000 mg | SUBCUTANEOUS | Status: DC
  Administered 2016-11-05 – 2016-11-09 (×5): 40 mg via SUBCUTANEOUS
  Filled 2016-11-04 (×5): qty 0.4

## 2016-11-04 NOTE — ED Triage Notes (Signed)
Per EMS: Pt recently seen at [facility] for falls. Pt sustained fall at [facility]. Pt with abrasion to L forehead. Pt responds to voice only. No verbal responses given. Per EMS: Pt receiving subcutaneous IV infusion PTA. Also pt's O2 sats 85% on RA. Pt sats 100% with 2L O2.

## 2016-11-04 NOTE — ED Notes (Signed)
Patient transported to CT 

## 2016-11-04 NOTE — H&P (Signed)
History and Physical    [name] JWJ:[id number] DOB: 14-May-1935 DOA: 11/04/2016  Referring MD/NP/PA:   PCP: No PCP Per Patient   Patient coming from:  The patient is coming from SNF.  At baseline, pt is dependent for most of ADL.   Chief Complaint: Altered mental status and fall.   HPI: [name] is a 82 y.o. female with medical history significant of dementia, TIA, stroke, pulmonary hypertension, aortic stenosis, hyperlipidemia, COPD, GERD, hypothyroidism, depression, anxiety, carotid artery stenosis, CKD-3, who presents with altered mental status and fall.  Patient has AMS is nonverble, is unable to provide any medical history, therefore, most of the history is obtained by discussing the case with ED physician, per EMS report, and with the nursing staff.  Per report, patient could talk and walk at normal baseline. She was noted to be confused and stopped talking today. She was found to be hypoxic with oxygen saturations to 85% on room air. She has recently beeen seen for multiple falls. When I saw pt in ED, she is nonverbal, not following command. She moves all extremities upon painful stimuli. Her oxygen saturation 96% on room air. No active cough, nausea, vomiting or diarrhea noted. No sure if she is hurting anywhere. Not sure if she has any symptoms of UTI. She has a small skin tear in forehead.  ED Course: pt was found to have positive urinalysis with large amount of leukocytes and positive nitrate (also has 6-30 squamous cells), negative troponin, stable renal function, temperature normal, no tachycardia, oxygen saturation 96% on room air, negative CT head for acute intracranial abnormalities. Patient is admitted to telemetry bed as inpatient.  Review of Systems: Could not be reviewed due to altered mental status and nonverbal status.  Allergy:  Allergies  Allergen Reactions  . Relafen [Nabumetone] Diarrhea and Other (See Comments)    GI / Urinary Bleeding  . Lipitor  [Atorvastatin] Other (See Comments)    Causes memory loss  . Other Other (See Comments)    "orvail" unknown:  Causes Bloody stool  . Penicillins Hives, Itching, Swelling and Rash    Has patient had a PCN reaction causing immediate rash, facial/tongue/throat swelling, SOB or lightheadedness with hypotension: no Has patient had a PCN reaction causing severe rash involving mucus membranes or skin necrosis: unknown Has patient had a PCN reaction that required hospitalization no Has patient had a PCN reaction occurring within the last 10 years: no If all of the above answers are "NO", then may proceed with Cephalosporin use.     Past Medical History:  Diagnosis Date  . Acute cystitis   . Anemia   . Anxiety   . Aortic stenosis, mild 11/18/2013  . Asthma   . Back pain   . Bronchitis, acute   . Carotid artery occlusion   . Cerebrovascular disease   . COPD (chronic obstructive pulmonary disease) ([place])   . Depression   . Diverticulosis of colon   . DJD (degenerative joint disease)   . Fall 07/05/2015  . GERD (gastroesophageal reflux disease)   . History of sudden visual loss   . Hyperlipidemia   . Hypertension   . Hypothyroidism   . Pneumonia 2015   hx of x 2   . Pulmonary hypertension, moderate to severe 11/19/2014  . Shortness of breath    with exertion   . 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 Transient ischemic attack   . UTI (lower urinary tract infection)  hx of     Past Surgical History:  Procedure Laterality Date  . CARDIAC CATHETERIZATION  02/18/14  . CARDIOTHORACIC PROCEDURE  01/13/2009   Right   . COLECTOMY    . ENDARTERECTOMY     right carotid endartarectomy - 2010  . FOOT SURGERY     nerve cut between toes   . LEFT AND RIGHT HEART CATHETERIZATION WITH CORONARY ANGIOGRAM N/A 02/18/2014   Procedure: LEFT AND RIGHT HEART CATHETERIZATION WITH CORONARY ANGIOGRAM;  Surgeon: Larey Dresser, MD;  Location: [facility] CATH LAB;  Service: Cardiovascular;   Laterality: N/A;  . REPLACEMENT TOTAL KNEE     right and left knee  . TEE WITHOUT CARDIOVERSION N/A 02/08/2014   Procedure: TRANSESOPHAGEAL ECHOCARDIOGRAM (TEE);  Surgeon: Larey Dresser, MD;  Location: [facility];  Service: Cardiovascular;  Laterality: N/A;  . TOTAL KNEE ARTHROPLASTY Left 11/17/2012   Procedure: TOTAL KNEE ARTHROPLASTY;  Surgeon: Gearlean Alf, MD;  Location: [facility] ORS;  Service: Orthopedics;  Laterality: Left;  . TUBAL LIGATION    . VAGINAL HYSTERECTOMY      Social History:  reports that she has never smoked. She has never used smokeless tobacco. She reports that she does not drink alcohol or use drugs.  Family History:  Family History  Problem Relation Age of Onset  . Emphysema Father   . Heart disease Mother   . Hyperlipidemia Mother   . Hypertension Mother   . Coronary artery disease Sister   . Heart disease Sister   . Hypertension Sister   . Varicose Veins Sister   . AAA (abdominal aortic aneurysm) Sister   . Rheumatic fever Brother     x2  . Heart attack Brother      Prior to Admission medications   Medication Sig Start Date End Date Taking? Authorizing Provider  ADVAIR HFA 230-21 MCG/ACT inhaler Inhale 2 puffs into the lungs 2 (two) times daily. 11/23/14  Yes Biagio Borg, MD  amLODipine (NORVASC) 5 MG tablet Take 1 tablet (5 mg total) by mouth daily. Must keep appt for future refills Patient taking differently: Take 5 mg by mouth daily.  10/03/15  Yes Biagio Borg, MD  aspirin 81 MG chewable tablet Chew 81 mg by mouth daily.   Yes Historical Provider, MD  baclofen (LIORESAL) 20 MG tablet Take 20 mg by mouth 3 (three) times daily.   Yes Historical Provider, MD  bisacodyl (DULCOLAX) 5 MG EC tablet Take 1 tablet (5 mg total) by mouth daily as needed for moderate constipation. 10/20/15  Yes Annita Brod, MD  calcium-vitamin D (OSCAL WITH D) 500-200 MG-UNIT TABS tablet Take 1 tablet by mouth 2 (two) times daily.   Yes Historical Provider, MD    carbidopa-levodopa (SINEMET IR) 25-100 MG tablet Take 1 tablet by mouth 3 (three) times daily. for parkinson's disease   Yes Historical Provider, MD  diazepam (VALIUM) 10 MG tablet Take 10 mg by mouth every 6 (six) hours as needed for anxiety.   Yes Historical Provider, MD  divalproex (DEPAKOTE) 125 MG DR tablet Take 125 mg by mouth 3 (three) times daily.   Yes Historical Provider, MD  escitalopram (LEXAPRO) 20 MG tablet Take 20 mg by mouth daily.   Yes Historical Provider, MD  fluticasone (FLONASE) 50 MCG/ACT nasal spray Place 1 spray into both nostrils daily.    Yes Historical Provider, MD  levothyroxine (SYNTHROID) 88 MCG tablet Take 88 mcg by mouth daily at 6 (six) AM.    Yes Historical Provider,  MD  megestrol (MEGACE ES) 625 MG/5ML suspension Take 625 mg by mouth daily.   Yes Historical Provider, MD  Omega-3 Fatty Acids (FISH OIL) 1000 MG CAPS Take 2,000 mg by mouth daily.   Yes Historical Provider, MD  omeprazole (PRILOSEC) 20 MG capsule Take 1 capsule (20 mg total) by mouth daily. 08/12/14  Yes Biagio Borg, MD  polyethylene glycol [facility]) packet Take 17 g by mouth daily. Mix in 8 oz liquid and drink   Yes Historical Provider, MD  pravastatin (PRAVACHOL) 20 MG tablet Take 1 tablet (20 mg total) by mouth daily at 6 PM. Patient taking differently: Take 20 mg by mouth at bedtime.  07/09/15  Yes Kelvin Cellar, MD  senna-docusate (SENNA S) 8.6-50 MG tablet Take 1 tablet by mouth daily as needed (constipation).   Yes Historical Provider, MD  sucralfate (CARAFATE) 1 g tablet Take 1 g by mouth 4 (four) times daily.    Yes Historical Provider, MD  traMADol (ULTRAM) 50 MG tablet Take 1 tablet (50 mg total) by mouth every 6 (six) hours as needed for moderate pain. 10/05/16  Yes Bonnielee Haff, MD  Cranberry 250 MG CAPS Take 250 mg by mouth 2 (two) times daily. For UTI prevention    Historical Provider, MD  diazepam (VALIUM) 2 MG tablet Take 1 tablet (2 mg total) by mouth 2 (two) times  daily. Patient not taking: Reported on 11/04/2016 10/05/16   Bonnielee Haff, MD  ferrous sulfate 325 (65 FE) MG tablet Take 325 mg by mouth daily with breakfast. Do not crush    Historical Provider, MD  guaifenesin (ROBAFEN) 100 MG/5ML syrup Take 300 mg by mouth 3 (three) times daily as needed for cough.     Historical Provider, MD  Infant Care Products [facility]) CREA Apply topically daily as needed (for buttocks).    Historical Provider, MD  LORazepam (ATIVAN) 0.5 MG tablet Take 1 tablet (0.5 mg total) by mouth every 12 (twelve) hours as needed for anxiety. 10/05/16   Bonnielee Haff, MD  meloxicam (MOBIC) 15 MG tablet Take 15 mg by mouth daily as needed for pain.     Historical Provider, MD  Menthol, Topical Analgesic, (BIOFREEZE) 4 % GEL Apply topically every 6 (six) hours as needed (for pain).    Historical Provider, MD  Oyster Shell 500 MG TABS Take 500 mg by mouth 2 (two) times daily.     Historical Provider, MD    Physical Exam: Vitals:   11/04/16 1930 11/04/16 2000 11/04/16 2015 11/04/16 2030  BP: (!) 153/57 (!) 148/61 (!) 152/57 (!) 155/59  Pulse: 70 68 66 68  Resp: (!) 21 18 17 18   Temp:      TempSrc:      SpO2: 96% 96% 95% 97%   General: Not in acute distress HEENT:       Eyes: PERRL, EOMI, no scleral icterus.       ENT: No discharge from the ears and nose, no pharynx injection, no tonsillar enlargement.        Neck: No JVD, no bruit, no mass felt. Heme: No neck lymph node enlargement. Cardiac: S1/S2, RRR, No murmurs, No gallops or rubs. Respiratory:  No rales, wheezing, rhonchi or rubs. GI: Soft, nondistended, nontender, no organomegaly, BS present. GU: No hematuria Ext: No pitting leg edema bilaterally. 2+DP/PT pulse bilaterally. Musculoskeletal: No joint deformities, No joint redness or warmth, no limitation of ROM in spin. Skin: No rashes. Has a small skin tear in forehead.  Neuro:  AMS, nonverbal, not oriented X3, following commands, cranial nerves II-XII grossly intact,  moves all extremities upon painful stimuli. Psych: Patient is not psychotic.  Labs on Admission: I have personally reviewed following labs and imaging studies  CBC:  Recent Labs Lab 11/04/16 1847  WBC 6.3  NEUTROABS 3.0  HGB 12.9  HCT 37.2  MCV 95.1  PLT 846   Basic Metabolic Panel:  Recent Labs Lab 11/04/16 1847  NA 135  K 3.9  CL 100*  CO2 24  GLUCOSE 93  BUN 16  CREATININE 1.36*  CALCIUM 9.4   GFR: CrCl cannot be calculated (Unknown ideal weight.). Liver Function Tests:  Recent Labs Lab 11/04/16 1847  AST 14*  ALT 6*  ALKPHOS 54  BILITOT 0.6  PROT 6.9  ALBUMIN 3.0*   No results for input(s): LIPASE, AMYLASE in the last 168 hours. No results for input(s): AMMONIA in the last 168 hours. Coagulation Profile: No results for input(s): INR, PROTIME in the last 168 hours. Cardiac Enzymes: No results for input(s): CKTOTAL, CKMB, CKMBINDEX, TROPONINI in the last 168 hours. BNP (last 3 results) No results for input(s): PROBNP in the last 8760 hours. HbA1C: No results for input(s): HGBA1C in the last 72 hours. CBG: No results for input(s): GLUCAP in the last 168 hours. Lipid Profile: No results for input(s): CHOL, HDL, LDLCALC, TRIG, CHOLHDL, LDLDIRECT in the last 72 hours. Thyroid Function Tests: No results for input(s): TSH, T4TOTAL, FREET4, T3FREE, THYROIDAB in the last 72 hours. Anemia Panel: No results for input(s): VITAMINB12, FOLATE, FERRITIN, TIBC, IRON, RETICCTPCT in the last 72 hours. Urine analysis:    Component Value Date/Time   COLORURINE YELLOW 11/04/2016 1745   APPEARANCEUR CLOUDY (A) 11/04/2016 1745   LABSPEC 1.013 11/04/2016 1745   PHURINE 6.0 11/04/2016 1745   GLUCOSEU NEGATIVE 11/04/2016 1745   GLUCOSEU NEGATIVE 07/05/2015 1724   HGBUR SMALL (A) 11/04/2016 1745   BILIRUBINUR NEGATIVE 11/04/2016 1745   KETONESUR 5 (A) 11/04/2016 1745   PROTEINUR 30 (A) 11/04/2016 1745   UROBILINOGEN 0.2 07/05/2015 1724   NITRITE POSITIVE (A)  11/04/2016 1745   LEUKOCYTESUR LARGE (A) 11/04/2016 1745   Sepsis Labs: @LABRCNTIP (procalcitonin:4,lacticidven:4) )No results found for this or any previous visit (from the past 240 hour(s)).   Radiological Exams on Admission: Ct Head Wo Contrast  Result Date: 11/04/2016 CLINICAL DATA:  Altered mental status. Recent fall. Left forehead abrasion. Initial encounter. EXAM: CT HEAD WITHOUT CONTRAST TECHNIQUE: Contiguous axial images were obtained from the base of the skull through the vertex without intravenous contrast. COMPARISON:  10/14/2016 FINDINGS: Brain: No evidence of acute infarction, hemorrhage, hydrocephalus, extra-axial collection or mass lesion/mass effect. Mild diffuse cerebral atrophy and chronic small vessel disease. Vascular: No hyperdense vessel or unexpected calcification. Skull: Normal. Negative for fracture or focal lesion. Sinuses/Orbits: No acute finding. Other: None. IMPRESSION: No acute intracranial abnormality. Mild cerebral atrophy and chronic small vessel disease. Electronically Signed   By: Earle Gell M.D.   On: 11/04/2016 19:17   Dg Chest Port 1 View  Result Date: 11/04/2016 CLINICAL DATA:  Altered mental status EXAM: PORTABLE CHEST 1 VIEW COMPARISON:  10/03/2016 chest radiograph. FINDINGS: Stable cardiomediastinal silhouette with normal heart size and aortic atherosclerosis. No pneumothorax. No pleural effusion. Mild patchy left lung base opacity, not clearly changed. No pulmonary edema. IMPRESSION: Mild patchy left lung base opacity, not clearly changed since 10/03/2016, favor mild scarring or atelectasis. Aortic atherosclerosis. Electronically Signed   By: Ilona Sorrel M.D.   On: 11/04/2016 18:44  EKG: Independently reviewed. Sinus rhythm, QTC 467, low voltage, nonspecific T-wave change.    Assessment/Plan Principal Problem:   Acute encephalopathy Active Problems:   Hypothyroidism   Anxiety state   Depression   Essential hypertension   GERD    Hyperlipidemia   UTI (urinary tract infection)   Anxiety and depression   Parkinson disease (HCC)   COPD (chronic obstructive pulmonary disease) (HCC)   Stroke (cerebrum) (HCC)   CKD (chronic kidney disease), stage III   Acute encephalopathy: Etiology is not clear. Possibly due to UTI. Patient had oxygen desaturation per report, but currently her oxygen saturation 96% on room air, low suspicious for PE. Patient moves all extremities on painful stimuli, no facial droop, CT head is negative for acute intracranial abnormalities, low suspicious for stroke.  - will admit to tele bed as inpt - Treated empirically for UTI -  Ceftriaxone by IV - Follow up results of urine and blood cx and amend antibiotic regimen if needed per sensitivity results - IVF: 500 cc of NS, then 75 cc/h - hold all oral meds  - though low suspicious for stroke, since patient has frequent fall, If not improving with antibiotics, may consider MRI of the brain to rule out stroke. Patient cannot tolerate MRI currently.  Frequent fall: Etiology is not clear. Possibly multifactorial etiology including multiple comorbidities, dementia, deconditioning and acute urinary tract infection. -will get PT/OT when able to  (not ordered yet) - Treat UTI as above - consult to wound care for skin tear  Hypothyroidism: Last TSH was 0.628 on 07/08/15 -Continue home Synthroid, but switch to IV, cut dose from 88 to 50 mcg daily  Depression and anxiety:  -hold oral home medications: valium, Lexapro, ativan  HTN: -hold oral meds, amlodipine -IVF hydralazine when necessary  GERD: -Pepcid IV  HLD: -hold pravastatin  Parkinson disease : -Hold Sinemet  COPD: stable -DuoNebs -prn albuterol nebs  Hx of stroke -ASA per rectal  CKD (chronic kidney disease), stage III: Stable. Baseline creatinine 1.0-1.3. -Follow-up renal function by BMP  DVT ppx: SQ Lovenox Code Status: Full code (patient has altered mental status, is nonverbal,  cannot to discuss the CODE STATUS. Based on previous Code status in record, temporarily put on full code) Family Communication: No family at bed side.   Disposition Plan:  Anticipate discharge back to previous SNF environment Consults called:  none Admission status:   Inpatient/tele  Date of Service 11/04/2016    Ivor Costa [facility] Pager [phone]  If 7PM-7AM, please contact night-coverage [email or web address] Password [facility] 11/04/2016, 10:42 PM

## 2016-11-04 NOTE — ED Notes (Signed)
ED Provider at bedside. 

## 2016-11-04 NOTE — ED Provider Notes (Signed)
[facility] DEPT Provider Note   CSN: [id number] Arrival date & time: 11/04/16  1753     History   Chief Complaint Chief Complaint  Patient presents with  . Altered Mental Status    HPI [name] is a 82 y.o. female.  HPI Level V caveat. Patient is nonverbal. Found by nursing staff to be hypoxic with oxygen saturations to 85% on room air. Has recently beeen seen for multiple falls. Past Medical History:  Diagnosis Date  . Acute cystitis   . Anemia   . Anxiety   . Aortic stenosis, mild 11/18/2013  . Asthma   . Back pain   . Bronchitis, acute   . Carotid artery occlusion   . Cerebrovascular disease   . COPD (chronic obstructive pulmonary disease) ([place])   . Depression   . Diverticulosis of colon   . DJD (degenerative joint disease)   . Fall 07/05/2015  . GERD (gastroesophageal reflux disease)   . History of sudden visual loss   . Hyperlipidemia   . Hypertension   . Hypothyroidism   . Pneumonia 2015   hx of x 2   . Pulmonary hypertension, moderate to severe 11/19/2014  . Shortness of breath    with exertion   . 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 Transient ischemic attack   . UTI (lower urinary tract infection)    hx of     Patient Active Problem List   Diagnosis Date Noted  . COPD (chronic obstructive pulmonary disease) ([place]) 11/04/2016  . Stroke (cerebrum) ([place]) 11/04/2016  . CKD (chronic kidney disease), stage III 11/04/2016  . Acute encephalopathy 10/04/2016  . UTI (lower urinary tract infection) 10/17/2015  . Dementia in Alzheimer's disease 10/17/2015  . Encephalopathy, metabolic 41/32/4401  . Contusion, hip 07/07/2015  . Falls frequently 07/07/2015  . Cervical radiculopathy 07/07/2015  . Anxiety and depression 07/07/2015  . Gait disturbance 07/07/2015  . Hyperglycemia 07/07/2015  . FTT (failure to thrive) in adult 07/07/2015  . Parkinson disease ([place])   . UTI (urinary tract infection) 07/05/2015  . Chronic neck pain 07/05/2015   . Debility 07/05/2015  . Fatigue 01/19/2015  . Dysuria 01/19/2015  . Chronic restrictive lung disease 01/04/2015  . DOE (dyspnea on exertion) 11/19/2014  . Pulmonary hypertension, moderate to severe 11/19/2014  . Irregular heart beats 08/12/2014  . Aftercare following surgery of the circulatory system, [place] 04/19/2014  . Pain of left lower extremity-Knee 04/19/2014  . Weakness 02/11/2014  . Loss of weight 02/11/2014  . Valvular heart disease 01/19/2014  . Pulmonary hypertension 01/19/2014  . CAP (community acquired pneumonia) 01/08/2014  . Asthma with acute exacerbation 01/06/2014  . Fall 01/06/2014  . Aortic stenosis, mild 11/18/2013  . Preventative health care 11/18/2013  . Anemia, unspecified 11/18/2013  . Tremor 09/21/2013  . Dyspnea 05/18/2013  . Physical deconditioning 05/18/2013  . OA (osteoarthritis) of knee 11/17/2012  . Hyperlipidemia 11/04/2012  . Occlusion and stenosis of carotid artery without mention of cerebral infarction 03/06/2012  . ASTHMA 10/09/2010  . Sudden visual loss 01/10/2009  . TRANSIENT ISCHEMIC ATTACK 01/10/2009  . CEREBROVASCULAR DISEASE 11/19/2007  . BRONCHITIS, RECURRENT 11/19/2007  . DIVERTICULOSIS OF COLON 11/19/2007  . Hypothyroidism 08/15/2007  . Anxiety state 08/15/2007  . Depression 08/15/2007  . Essential hypertension 08/15/2007  . GERD 08/15/2007  . Osteoarthritis 08/15/2007  . BACK PAIN, LUMBAR 08/15/2007    Past Surgical History:  Procedure Laterality Date  . CARDIAC CATHETERIZATION  02/18/14  .  CARDIOTHORACIC PROCEDURE  01/13/2009   Right   . COLECTOMY    . ENDARTERECTOMY     right carotid endartarectomy - 2010  . FOOT SURGERY     nerve cut between toes   . LEFT AND RIGHT HEART CATHETERIZATION WITH CORONARY ANGIOGRAM N/A 02/18/2014   Procedure: LEFT AND RIGHT HEART CATHETERIZATION WITH CORONARY ANGIOGRAM;  Surgeon: Larey Dresser, MD;  Location: [facility] CATH LAB;  Service: Cardiovascular;  Laterality: N/A;  . REPLACEMENT TOTAL  KNEE     right and left knee  . TEE WITHOUT CARDIOVERSION N/A 02/08/2014   Procedure: TRANSESOPHAGEAL ECHOCARDIOGRAM (TEE);  Surgeon: Larey Dresser, MD;  Location: [facility];  Service: Cardiovascular;  Laterality: N/A;  . TOTAL KNEE ARTHROPLASTY Left 11/17/2012   Procedure: TOTAL KNEE ARTHROPLASTY;  Surgeon: Gearlean Alf, MD;  Location: [facility];  Service: Orthopedics;  Laterality: Left;  . TUBAL LIGATION    . VAGINAL HYSTERECTOMY      OB History    No data available       Home Medications    Prior to Admission medications   Medication Sig Start Date End Date Taking? Authorizing Provider  ADVAIR HFA 230-21 MCG/ACT inhaler Inhale 2 puffs into the lungs 2 (two) times daily. 11/23/14  Yes Biagio Borg, MD  amLODipine (NORVASC) 5 MG tablet Take 1 tablet (5 mg total) by mouth daily. Must keep appt for future refills Patient taking differently: Take 5 mg by mouth daily.  10/03/15  Yes Biagio Borg, MD  aspirin 81 MG chewable tablet Chew 81 mg by mouth daily.   Yes Historical Provider, MD  baclofen (LIORESAL) 20 MG tablet Take 20 mg by mouth 3 (three) times daily.   Yes Historical Provider, MD  bisacodyl (DULCOLAX) 5 MG EC tablet Take 1 tablet (5 mg total) by mouth daily as needed for moderate constipation. 10/20/15  Yes Annita Brod, MD  calcium-vitamin D (OSCAL WITH D) 500-200 MG-UNIT TABS tablet Take 1 tablet by mouth 2 (two) times daily.   Yes Historical Provider, MD  carbidopa-levodopa (SINEMET IR) 25-100 MG tablet Take 1 tablet by mouth 3 (three) times daily. for parkinson's disease   Yes Historical Provider, MD  diazepam (VALIUM) 10 MG tablet Take 10 mg by mouth every 6 (six) hours as needed for anxiety.   Yes Historical Provider, MD  divalproex (DEPAKOTE) 125 MG DR tablet Take 125 mg by mouth 3 (three) times daily.   Yes Historical Provider, MD  escitalopram (LEXAPRO) 20 MG tablet Take 20 mg by mouth daily.   Yes Historical Provider, MD  fluticasone (FLONASE) 50 MCG/ACT nasal  spray Place 1 spray into both nostrils daily.    Yes Historical Provider, MD  levothyroxine (SYNTHROID) 88 MCG tablet Take 88 mcg by mouth daily at 6 (six) AM.    Yes Historical Provider, MD  megestrol (MEGACE ES) 625 MG/5ML suspension Take 625 mg by mouth daily.   Yes Historical Provider, MD  Omega-3 Fatty Acids (FISH OIL) 1000 MG CAPS Take 2,000 mg by mouth daily.   Yes Historical Provider, MD  omeprazole (PRILOSEC) 20 MG capsule Take 1 capsule (20 mg total) by mouth daily. 08/12/14  Yes Biagio Borg, MD  polyethylene glycol [facility] / Floria Raveling) packet Take 17 g by mouth daily. Mix in 8 oz liquid and drink   Yes Historical Provider, MD  pravastatin (PRAVACHOL) 20 MG tablet Take 1 tablet (20 mg total) by mouth daily at 6 PM. Patient taking differently: Take 20 mg by  mouth at bedtime.  07/09/15  Yes Kelvin Cellar, MD  senna-docusate (SENNA S) 8.6-50 MG tablet Take 1 tablet by mouth daily as needed (constipation).   Yes Historical Provider, MD  sucralfate (CARAFATE) 1 g tablet Take 1 g by mouth 4 (four) times daily.    Yes Historical Provider, MD  traMADol (ULTRAM) 50 MG tablet Take 1 tablet (50 mg total) by mouth every 6 (six) hours as needed for moderate pain. 10/05/16  Yes Bonnielee Haff, MD  diazepam (VALIUM) 2 MG tablet Take 1 tablet (2 mg total) by mouth 2 (two) times daily. Patient not taking: Reported on 11/04/2016 10/05/16   Bonnielee Haff, MD  LORazepam (ATIVAN) 0.5 MG tablet Take 1 tablet (0.5 mg total) by mouth every 12 (twelve) hours as needed for anxiety. Patient not taking: Reported on 11/04/2016 10/05/16   Bonnielee Haff, MD    Family History Family History  Problem Relation Age of Onset  . Emphysema Father   . Heart disease Mother   . Hyperlipidemia Mother   . Hypertension Mother   . Coronary artery disease Sister   . Heart disease Sister   . Hypertension Sister   . Varicose Veins Sister   . AAA (abdominal aortic aneurysm) Sister   . Rheumatic fever Brother     x2  . Heart  attack Brother     Social History Social History  Substance Use Topics  . Smoking status: Never Smoker  . Smokeless tobacco: Never Used  . Alcohol use No     Allergies   Relafen [nabumetone]; Lipitor [atorvastatin]; Other; and Penicillins   Review of Systems Review of Systems  Unable to perform ROS: Mental status change     Physical Exam Updated Vital Signs BP (!) 155/59   Pulse 68   Temp 98 F (36.7 C) (Axillary)   Resp 18   SpO2 98%   Physical Exam  Constitutional: She appears well-developed and well-nourished. No distress.  Cachectic and dry.  HENT:  Head: Normocephalic.  Old left periorbital contusion. Patient also has a abrasion in the center of her forehead. Dry mucous membranes  Eyes: EOM are normal. Pupils are equal, round, and reactive to light.  Pupils are 2 mm and reactive bilaterally.  Neck: Normal range of motion. Neck supple.  No posterior midline cervical tenderness to palpation.  Cardiovascular: Normal rate and regular rhythm.  Exam reveals no gallop and no friction rub.   No murmur heard. Pulmonary/Chest: Effort normal. No respiratory distress. She has no wheezes. She has rales (few scattered rales mostly in bilateral bases).  Abdominal: Soft. Bowel sounds are normal. There is no tenderness. There is no rebound and no guarding.  Musculoskeletal: Normal range of motion. She exhibits no edema or tenderness.  No lower extremity swelling or asymmetry. Distal pulses intact.  Neurological:  Patient is nonverbal. She is following simple commands. Bilateral grip strength is normal.  Skin: Skin is warm and dry. Capillary refill takes less than 2 seconds. No rash noted. No erythema.  Nursing note and vitals reviewed.    ED Treatments / Results  Labs (all labs ordered are listed, but only abnormal results are displayed) Labs Reviewed  COMPREHENSIVE METABOLIC PANEL - Abnormal; Notable for the following:       Result Value   Chloride 100 (*)     Creatinine, Ser 1.36 (*)    Albumin 3.0 (*)    AST 14 (*)    ALT 6 (*)    GFR calc non Af Amer 36 (*)  GFR calc Af Amer 42 (*)    All other components within normal limits  URINALYSIS, ROUTINE W REFLEX MICROSCOPIC - Abnormal; Notable for the following:    APPearance CLOUDY (*)    Hgb urine dipstick SMALL (*)    Ketones, ur 5 (*)    Protein, ur 30 (*)    Nitrite POSITIVE (*)    Leukocytes, UA LARGE (*)    Bacteria, UA RARE (*)    Squamous Epithelial / LPF 6-30 (*)    All other components within normal limits  URINE CULTURE  CULTURE, BLOOD (ROUTINE X 2)  CULTURE, BLOOD (ROUTINE X 2)  CBC WITH DIFFERENTIAL/PLATELET  BRAIN NATRIURETIC PEPTIDE  BASIC METABOLIC PANEL  CBC  I-STAT TROPOININ, ED    EKG  EKG Interpretation None       Radiology Ct Head Wo Contrast  Result Date: 11/04/2016 CLINICAL DATA:  Altered mental status. Recent fall. Left forehead abrasion. Initial encounter. EXAM: CT HEAD WITHOUT CONTRAST TECHNIQUE: Contiguous axial images were obtained from the base of the skull through the vertex without intravenous contrast. COMPARISON:  10/14/2016 FINDINGS: Brain: No evidence of acute infarction, hemorrhage, hydrocephalus, extra-axial collection or mass lesion/mass effect. Mild diffuse cerebral atrophy and chronic small vessel disease. Vascular: No hyperdense vessel or unexpected calcification. Skull: Normal. Negative for fracture or focal lesion. Sinuses/Orbits: No acute finding. Other: None. IMPRESSION: No acute intracranial abnormality. Mild cerebral atrophy and chronic small vessel disease. Electronically Signed   By: Earle Gell M.D.   On: 11/04/2016 19:17   Dg Chest Port 1 View  Result Date: 11/04/2016 CLINICAL DATA:  Altered mental status EXAM: PORTABLE CHEST 1 VIEW COMPARISON:  10/03/2016 chest radiograph. FINDINGS: Stable cardiomediastinal silhouette with normal heart size and aortic atherosclerosis. No pneumothorax. No pleural effusion. Mild patchy left lung base  opacity, not clearly changed. No pulmonary edema. IMPRESSION: Mild patchy left lung base opacity, not clearly changed since 10/03/2016, favor mild scarring or atelectasis. Aortic atherosclerosis. Electronically Signed   By: Ilona Sorrel M.D.   On: 11/04/2016 18:44    Procedures Procedures (including critical care time)  Medications Ordered in ED Medications  cefTRIAXone (ROCEPHIN) 1 g in dextrose 5 % 50 mL IVPB (not administered)  albuterol (PROVENTIL) (2.5 MG/3ML) 0.083% nebulizer solution 2.5 mg (not administered)  0.9 %  sodium chloride infusion (not administered)  enoxaparin (LOVENOX) injection 40 mg (not administered)  sodium chloride flush (NS) 0.9 % injection 3 mL (not administered)  acetaminophen (TYLENOL) tablet 650 mg (not administered)    Or  acetaminophen (TYLENOL) suppository 650 mg (not administered)  ondansetron (ZOFRAN) tablet 4 mg (not administered)    Or  ondansetron (ZOFRAN) injection 4 mg (not administered)  famotidine (PEPCID) IVPB 20 mg premix (not administered)  aspirin suppository 150 mg (not administered)  levothyroxine (SYNTHROID, LEVOTHROID) injection 50 mcg (not administered)  hydrALAZINE (APRESOLINE) injection 5 mg (not administered)  ipratropium-albuterol (DUONEB) 0.5-2.5 (3) MG/3ML nebulizer solution 3 mL (not administered)  sodium chloride 0.9 % bolus 500 mL (0 mLs Intravenous Stopped 11/04/16 2041)  cefTRIAXone (ROCEPHIN) 1 g in dextrose 5 % 50 mL IVPB (0 g Intravenous Stopped 11/04/16 2149)     Initial Impression / Assessment and Plan / ED Course  I have reviewed the triage vital signs and the nursing notes.  Pertinent labs & imaging results that were available during my care of the patient were reviewed by me and considered in my medical decision making (see chart for details).   evidence of UTI on UA. CT without  acute abnormality. Daughters at bedside. Per daughter patient is complaining a ventilatory and interactive. Given IV Rocephin in the  emergency department. Discuss with hospitalist and will admit.    Final Clinical Impressions(s) / ED Diagnoses   Final diagnoses:  Stupor  Lower urinary tract infectious disease    New Prescriptions New Prescriptions   No medications on file     Julianne Rice, MD 11/04/16 2309

## 2016-11-05 DIAGNOSIS — N3000 Acute cystitis without hematuria: Secondary | ICD-10-CM

## 2016-11-05 DIAGNOSIS — E860 Dehydration: Secondary | ICD-10-CM

## 2016-11-05 DIAGNOSIS — E43 Unspecified severe protein-calorie malnutrition: Secondary | ICD-10-CM | POA: Insufficient documentation

## 2016-11-05 DIAGNOSIS — N179 Acute kidney failure, unspecified: Secondary | ICD-10-CM

## 2016-11-05 LAB — CBC
HCT: 37.1 % (ref 36.0–46.0)
HEMOGLOBIN: 12.4 g/dL (ref 12.0–15.0)
MCH: 31.9 pg (ref 26.0–34.0)
MCHC: 33.4 g/dL (ref 30.0–36.0)
MCV: 95.4 fL (ref 78.0–100.0)
Platelets: 342 10*3/uL (ref 150–400)
RBC: 3.89 MIL/uL (ref 3.87–5.11)
RDW: 12.9 % (ref 11.5–15.5)
WBC: 6.7 10*3/uL (ref 4.0–10.5)

## 2016-11-05 LAB — BASIC METABOLIC PANEL
ANION GAP: 12 (ref 5–15)
BUN: 14 mg/dL (ref 6–20)
CHLORIDE: 102 mmol/L (ref 101–111)
CO2: 25 mmol/L (ref 22–32)
Calcium: 9.4 mg/dL (ref 8.9–10.3)
Creatinine, Ser: 1.33 mg/dL — ABNORMAL HIGH (ref 0.44–1.00)
GFR calc non Af Amer: 37 mL/min — ABNORMAL LOW (ref >60)
GFR, EST AFRICAN AMERICAN: 43 mL/min — AB (ref >60)
Glucose, Bld: 94 mg/dL (ref 65–99)
Potassium: 3.8 mmol/L (ref 3.5–5.1)
Sodium: 139 mmol/L (ref 135–145)

## 2016-11-05 LAB — MRSA PCR SCREENING: MRSA by PCR: POSITIVE — AB

## 2016-11-05 LAB — GLUCOSE, CAPILLARY: Glucose-Capillary: 79 mg/dL (ref 65–99)

## 2016-11-05 MED ORDER — BLISTEX MEDICATED EX OINT
TOPICAL_OINTMENT | CUTANEOUS | Status: DC | PRN
  Administered 2016-11-06: 1 via TOPICAL
  Filled 2016-11-05: qty 6.3

## 2016-11-05 MED ORDER — SODIUM CHLORIDE 0.9 % IV SOLN
INTRAVENOUS | Status: DC
  Administered 2016-11-06: 05:00:00 via INTRAVENOUS

## 2016-11-05 MED ORDER — BACITRACIN-NEOMYCIN-POLYMYXIN OINTMENT TUBE
TOPICAL_OINTMENT | Freq: Every morning | CUTANEOUS | Status: DC
  Administered 2016-11-05: 1 via TOPICAL
  Administered 2016-11-06 – 2016-11-09 (×4): via TOPICAL
  Filled 2016-11-05 (×2): qty 14.17

## 2016-11-05 NOTE — Progress Notes (Signed)
Received patient via stretcher from ED, accompanied by care giver. Patient is being admitted with AMS. Currently, patient is nonverbal, and unable to answer medical questions. Full code, patient has had several falls from SNF, she has bilateral ecchymosis on eyes, more on the Left. She has a skin tear on forehead, bruising on R knee with some abrasion on RLE, open to air.  She has a red line on left side of neck. She is a High Fall Risk, and a Yellow band and yellow socks placed accordingly.  Arm band and Cardiac monitor verified. Skin assessments completed by two RNs. She has 2 IVs: 20 in [facility] and 22 in RFA(Antecubital). She has orders for Normal Saline at 75cc/hr. Caregiver at bedside. Bed alarm on. Safety maintained.

## 2016-11-05 NOTE — Progress Notes (Signed)
Patient disoriented, nonverbal; unable to do a full neuro check. Will continue to monitor.

## 2016-11-05 NOTE — Progress Notes (Addendum)
PROGRESS NOTE   [name]  DEY:[id number]    DOB: [birth year]    DOA: 11/04/2016  PCP: No PCP Per Patient   I have briefly reviewed patients previous medical records in [facility].  Brief Narrative:  82 year old female, PMH of cerebrovascular disease, dementia, COPD, anxiety, depression, diverticulosis, GERD, HLD, HTN, hypothyroid, CVA, Parkinson's disease, up to 2 weeks ago recited at an assisted living facility, ambulated with the help of a walker, apparently able to hold a good conversation, hospitalized at [facility] within the last 2 weeks for? Rectal bleeding but family opted nonaggressive management due to advanced age, then discharged to SNF, presented to ED with altered mental status and fall. She was briefly hypoxic at 85% on room air which resolved when seen in the ED. Recent recurrent falls. Urine microscopy suspicious for UTI. CT head without acute findings.   Assessment & Plan:   Principal Problem:   Acute encephalopathy Active Problems:   Hypothyroidism   Anxiety state   Depression   Essential hypertension   GERD   Hyperlipidemia   UTI (urinary tract infection)   Anxiety and depression   Parkinson disease (HCC)   COPD (chronic obstructive pulmonary disease) (HCC)   Stroke (cerebrum) (H[facility])   CKD (chronic kidney disease), stage III   Protein-calorie malnutrition, severe   1. Acute encephalopathy: Presumed due to UTI complicating underlying dementia. No focal deficits on exam. CT head without acute findings. No hypoxia when evaluated in ED. Treat for UTI and monitor closely. If does not respond appropriately in the next 24 hours, consider further evaluation. NPO until consistently awake, alert and safe to take by mouth. 2. UTI: Continue IV Rocephin pending urine culture results. 3. Frequent falls: Probably multifactorial related to advanced age, frail physical health, multiple comorbidities, acute illness, are getting underlying dementia &  deconditioning. PT evaluation and likely return to SNF level of care. 4. Hypothyroid: Last TSH was 0.628 on 07/08/15. Currently on IV Synthroid while nothing by mouth 5. Anxiety and depression: Holding oral medications until mental status improves. 6. Essential hypertension: Holding oral medications. Controlled. When necessary IV hydralazine. 7. GERD: IV PPI 8. Hyperlipidemia: Holding oral medications 9. Parkinson's disease: Resume Sinemet as soon as able to take by mouth. 10. COPD: Stable. 11. History of CVA: Rectal aspirin. 12. Acute on chronic Stage III chronic kidney disease: At baseline and patient probably has normal creatinine. Presented with creatinine of 1.36. IV fluids and follow BMP. 13. Left lung base opacity, seen on chest x-ray: Unchanged since 10/03/16 and per radiology favoring mild scarring or atelectasis. Follow-up chest x-ray as outpatient 14. Severe malnutrition in the context of chronic illness: Per dietitian input when able to tolerate by mouth. 15. Dehydration: IV fluids. 16. Dementia: Discussion as above. 23. Adult failure to thrive: Multifactorial.   DVT prophylaxis: Lovenox Code Status: Full code Family Communication: Discussed in detail with patient's caregiver at bedside. Disposition: DC to SNF when medically stable.   Consultants:  None   Procedures:  None  Antimicrobials:  IV ceftriaxone    Subjective: Somnolent but arousable and tracks with her eyes. Nonverbal and does not follow instructions. As per caregiver, mental status not significantly changed since last night.   ROS: Unable to obtain  Objective:  Vitals:   11/05/16 0005 11/05/16 0520 11/05/16 0745 11/05/16 1507  BP: (!) 149/50 (!) 138/46  (!) 137/49  Pulse: 79 67  68  Resp: 17 17  17   Temp: 98.2 F (36.8 C) 99.1 F (  37.3 C)  98.3 F (36.8 C)  TempSrc: Oral Oral    SpO2: 93% 97% 95% 95%  Weight: 63.4 kg (139 lb 12.4 oz)     Height: 5\' 4"  (1.626 m)       Examination:  General  exam: Elderly female, small built and frail, chronically looking, lying comfortably propped up in bed and mouth breathing. Respiratory system: Clear to auscultation/poor inspiratory effort. Respiratory effort normal. Cardiovascular system: S1 & S2 heard, RRR. No JVD, murmurs, rubs, gallops or clicks. No pedal edema. Telemetry: Sinus rhythm. Gastrointestinal system: Abdomen is nondistended, soft and nontender. No organomegaly or masses felt. Normal bowel sounds heard. Central nervous system: Mental status as above. No focal neurological deficits. Extremities: Spontaneously moves bilateral upper extremities symmetrically. Parkinsonian tremor of both upper extremities. Skin: Area of bruising without acute findings over upper mid forehead. Psychiatry: Judgement and insight impaired    Data Reviewed: I have personally reviewed following labs and imaging studies  CBC:  Recent Labs Lab 11/04/16 1847 11/05/16 0027  WBC 6.3 6.7  NEUTROABS 3.0  --   HGB 12.9 12.4  HCT 37.2 37.1  MCV 95.1 95.4  PLT 319 426   Basic Metabolic Panel:  Recent Labs Lab 11/04/16 1847 11/05/16 0027  NA 135 139  K 3.9 3.8  CL 100* 102  CO2 24 25  GLUCOSE 93 94  BUN 16 14  CREATININE 1.36* 1.33*  CALCIUM 9.4 9.4   Liver Function Tests:  Recent Labs Lab 11/04/16 1847  AST 14*  ALT 6*  ALKPHOS 54  BILITOT 0.6  PROT 6.9  ALBUMIN 3.0*   CBG:  Recent Labs Lab 11/05/16 0756  GLUCAP 79    Recent Results (from the past 240 hour(s))  MRSA PCR Screening     Status: Abnormal   Collection Time: 11/05/16  5:14 AM  Result Value Ref Range Status   MRSA by PCR POSITIVE (A) NEGATIVE Final    Comment:        The GeneXpert MRSA Assay (FDA approved for NASAL specimens only), is one component of a comprehensive MRSA colonization surveillance program. It is not intended to diagnose MRSA infection nor to guide or monitor treatment for MRSA infections. CRITICAL RESULT CALLED TO, READ BACK BY AND  VERIFIED WITH: RN Jefferson [employer] [id number] [phone] MLM          Radiology Studies: Ct Head Wo Contrast  Result Date: 11/04/2016 CLINICAL DATA:  Altered mental status. Recent fall. Left forehead abrasion. Initial encounter. EXAM: CT HEAD WITHOUT CONTRAST TECHNIQUE: Contiguous axial images were obtained from the base of the skull through the vertex without intravenous contrast. COMPARISON:  10/14/2016 FINDINGS: Brain: No evidence of acute infarction, hemorrhage, hydrocephalus, extra-axial collection or mass lesion/mass effect. Mild diffuse cerebral atrophy and chronic small vessel disease. Vascular: No hyperdense vessel or unexpected calcification. Skull: Normal. Negative for fracture or focal lesion. Sinuses/Orbits: No acute finding. Other: None. IMPRESSION: No acute intracranial abnormality. Mild cerebral atrophy and chronic small vessel disease. Electronically Signed   By: Earle Gell M.D.   On: 11/04/2016 19:17   Dg Chest Port 1 View  Result Date: 11/04/2016 CLINICAL DATA:  Altered mental status EXAM: PORTABLE CHEST 1 VIEW COMPARISON:  10/03/2016 chest radiograph. FINDINGS: Stable cardiomediastinal silhouette with normal heart size and aortic atherosclerosis. No pneumothorax. No pleural effusion. Mild patchy left lung base opacity, not clearly changed. No pulmonary edema. IMPRESSION: Mild patchy left lung base opacity, not clearly changed since 10/03/2016, favor mild scarring or atelectasis. Aortic atherosclerosis. Electronically  Signed   By: Ilona Sorrel M.D.   On: 11/04/2016 18:44        Scheduled Meds: . aspirin  150 mg Rectal Daily  . cefTRIAXone (ROCEPHIN)  IV  1 g Intravenous Q24H  . enoxaparin (LOVENOX) injection  40 mg Subcutaneous Q24H  . famotidine (PEPCID) IV  20 mg Intravenous Q12H  . ipratropium-albuterol  3 mL Nebulization BID  . levothyroxine  50 mcg Intravenous Daily  . neomycin-bacitracin-polymyxin   Topical q morning - 10a  . sodium chloride flush  3 mL Intravenous Q12H    Continuous Infusions: . sodium chloride 1,000 mL (11/05/16 1352)     LOS: 1 day     Seda Kronberg, MD, FACP, FHM. [facility] Pager [phone]  If 7PM-7AM, please contact night-coverage [email or web address] Password [facility] 11/05/2016, 3:53 PM

## 2016-11-05 NOTE — Consult Note (Signed)
[facility] Nurse wound consult note Reason for Consult: Consult requested for forehead wound. Wound type: Partial thickness abrasion to middle forehead related to a fall Measurement: Patchy areas of partial thickness skin loss, approx 1.5X1.5X.1cm Wound bed: 90% pink and moist, 10% yellow Drainage (amount, consistency, odor) Small amt tan drainage, no odor Periwound: Intact skin surrounding Dressing procedure/placement/frequency: Neosporin ointment to promote moist healing.  Foam dressing to protect from further injury. No family members at the bedside to discuss plan of care. Please re-consult if further assistance is needed.  Thank-you,  Julien Girt MSN, [place], [place], [place], [place]

## 2016-11-05 NOTE — Progress Notes (Signed)
Initial Nutrition Assessment  DOCUMENTATION CODES:   Severe malnutrition in context of chronic illness  INTERVENTION:   -RD will follow for diet advancement and supplement as appropriate -Recommend SLP evaluation to determine safest diet  NUTRITION DIAGNOSIS:   Malnutrition related to chronic illness (dementia) as evidenced by percent weight loss, moderate depletions of muscle mass, severe depletion of muscle mass, moderate depletion of body fat, severe depletion of body fat.  GOAL:   Patient will meet greater than or equal to 90% of their needs  MONITOR:   Diet advancement, PO intake, Supplement acceptance, Labs, Weight trends, Skin, I & O's  REASON FOR ASSESSMENT:   Low Braden    ASSESSMENT:   [name] is a 82 y.o. female with medical history significant of dementia, TIA, stroke, pulmonary hypertension, aortic stenosis, hyperlipidemia, COPD, GERD, hypothyroidism, depression, anxiety, carotid artery stenosis, CKD-3, who presents with altered mental status and fall.  Pt admitted with AMS and acute encephalopathy.   Due to mental status, pt did not arouse for interview or exam. Spoke with pt's private caretaker at bedside, who has been taking care of pt over the past 2-3 weeks. She describes that pt has experienced a severe general decline in health over the past 2-3 weeks, including multiple falls, dysphagia, poor oral intake, and hospitalizations. Pt was also transitioned from an ALF to SNF level of care. Pt consumes a regular diet at baseline, however, intake has been minimal related to acute illness and multiple periods of NPO due to diarrhea. Per sitter, pt was transitioned to a pureed diet approximately one week ago related to difficulty swallowing, however, "she was not feeling that". She shares that pt would hold food in her mouth and refuse to eat.   She estimates pt has lost weight, but unsure how much. Per wt hx, pt has experienced a 21# (13.1%) wt loss over the past  6 months, which is significant for time frame.   Nutrition-Focused physical exam completed. Findings are moderate to severe fat depletion, moderate to severe muscle depletion, and no edema. At baseline, pt was able to ambulate using walker with assistance, but mainly used her feet to propel her wheelchair.   Pt caregiver understanding of NPO diet order. She thinks pt will accept supplements once diet is advanced. She does not recall pt consuming supplements PTA. Reviewed records from [facility]; pertinent medications include miralax, fish oil, calcium with vitamin D, and megestrol.   Labs reviewed.   Diet Order:  Diet NPO time specified  Skin:  Wound (see comment) (partial thickness abrasion to forehead)  Last BM:  PTA  Height:   Ht Readings from Last 1 Encounters:  11/05/16 5\' 4"  (1.626 m)    Weight:   Wt Readings from Last 1 Encounters:  11/05/16 139 lb 12.4 oz (63.4 kg)    Ideal Body Weight:  54.5 kg  BMI:  Body mass index is 23.99 kg/m.  Estimated Nutritional Needs:   Kcal:  1500-1700  Protein:  70-85 grams  Fluid:  1.5-1.7 L  EDUCATION NEEDS:   Education needs no appropriate at this time  [name] A. Jimmye Norman, RD, LDN, CDE Pager: [phone] After hours Pager: [phone]

## 2016-11-06 ENCOUNTER — Encounter: Payer: Self-pay | Admitting: Family Medicine

## 2016-11-06 LAB — BASIC METABOLIC PANEL
ANION GAP: 10 (ref 5–15)
BUN: 15 mg/dL (ref 6–20)
CHLORIDE: 108 mmol/L (ref 101–111)
CO2: 22 mmol/L (ref 22–32)
CREATININE: 1.18 mg/dL — AB (ref 0.44–1.00)
Calcium: 8.7 mg/dL — ABNORMAL LOW (ref 8.9–10.3)
GFR, EST AFRICAN AMERICAN: 49 mL/min — AB (ref >60)
GFR, EST NON AFRICAN AMERICAN: 43 mL/min — AB (ref >60)
Glucose, Bld: 71 mg/dL (ref 65–99)
POTASSIUM: 3.7 mmol/L (ref 3.5–5.1)
SODIUM: 140 mmol/L (ref 135–145)

## 2016-11-06 MED ORDER — MUPIROCIN 2 % EX OINT
TOPICAL_OINTMENT | CUTANEOUS | Status: AC
  Administered 2016-11-06: 1
  Filled 2016-11-06: qty 22

## 2016-11-06 MED ORDER — BACLOFEN 20 MG PO TABS
20.0000 mg | ORAL_TABLET | Freq: Three times a day (TID) | ORAL | Status: DC
  Administered 2016-11-06 (×2): 20 mg via ORAL
  Filled 2016-11-06 (×4): qty 1

## 2016-11-06 MED ORDER — LEVOTHYROXINE SODIUM 88 MCG PO TABS
88.0000 ug | ORAL_TABLET | Freq: Every day | ORAL | Status: DC
  Administered 2016-11-08 – 2016-11-09 (×2): 88 ug via ORAL
  Filled 2016-11-06 (×3): qty 1

## 2016-11-06 MED ORDER — CARBIDOPA-LEVODOPA 25-100 MG PO TABS
1.0000 | ORAL_TABLET | Freq: Three times a day (TID) | ORAL | Status: DC
  Administered 2016-11-06 – 2016-11-09 (×8): 1 via ORAL
  Filled 2016-11-06 (×11): qty 1

## 2016-11-06 MED ORDER — CHLORHEXIDINE GLUCONATE CLOTH 2 % EX PADS
6.0000 | MEDICATED_PAD | Freq: Two times a day (BID) | CUTANEOUS | Status: DC
  Administered 2016-11-06 – 2016-11-09 (×6): 6 via TOPICAL

## 2016-11-06 MED ORDER — MUPIROCIN 2 % EX OINT
TOPICAL_OINTMENT | Freq: Two times a day (BID) | CUTANEOUS | Status: DC
  Administered 2016-11-06 – 2016-11-09 (×6): via NASAL
  Filled 2016-11-06 (×2): qty 22

## 2016-11-06 NOTE — Evaluation (Signed)
Physical Therapy Evaluation Patient Details Name: [name] MRN: [id number] DOB: July 18, 1935 Today's Date: 11/06/2016   History of Present Illness  Pt is a 82 yo female admitted through ED on 11/04/16 following AMS resulting from UTI and acute encephalopathy. Pt was recently hospitalized last month and was DC'd to SNF where she is recieving 24hr CG assistance. Pt was diagnosed with a UTI. PMH significant for Parkinson's, Alzheimers, frequent UTI, acute cystitis, Anemia, Anxiety, aortic stenosis, Asthma, COPD, Bronchitis, DJD.   Clinical Impression  Pt presents with the above diagnosis and below deficits. Prior to admission, pt had a previous hospitalization following a fall at her memory care unit of an ALF and was at [facility] for rehab. Therapy was never initiated as pt became confused and unable to participate before returning to hospital for current admission. Pt has 24hr cg assistance in SNF due to fall related to AMS and lack of awareness to deficits. Pt required Mod A for bed mobs and transfers this session with +2 for safety. Pt was able to participate in transfers and mobility prior to admission. Pt will benefit from return to SNF to assist with improving mobility in order to assist with return to PLOF. Pt continues to require acute PT services in order to address below deficits prior to DC.     Follow Up Recommendations SNF;Supervision/Assistance - 24 hour    Equipment Recommendations  None recommended by PT    Recommendations for Other Services       Precautions / Restrictions Precautions Precautions: Fall Precaution Comments: little awareness of deficits Restrictions Weight Bearing Restrictions: No      Mobility  Bed Mobility Overal bed mobility: Needs Assistance Bed Mobility: Supine to Sit     Supine to sit: Mod assist     General bed mobility comments: Mod A to bring bottom EOB with use of pads, pt does initiate movement but requires assistance to  complete  Transfers Overall transfer level: Needs assistance Equipment used: 2 person hand held assist Transfers: Sit to/from [facility] Sit to Stand: Mod assist;+2 physical assistance Stand pivot transfers: Mod assist;+2 safety/equipment       General transfer comment: Mod A +2 with CG assistance from EOB to recliner. Cues for sequencing, pt is able to stand but requires assistance for completion of movement  Ambulation/Gait             General Gait Details: Did not attempt, pt was non-ambulatory prior to admission  Stairs            Wheelchair Mobility    Modified Rankin (Stroke Patients Only)       Balance Overall balance assessment: History of Falls;Needs assistance Sitting-balance support: Single extremity supported;Feet supported Sitting balance-Leahy Scale: Fair     Standing balance support: Bilateral upper extremity supported;During functional activity Standing balance-Leahy Scale: Poor Standing balance comment: Relies heavily on assistance to perform standing and transfer activities                             Pertinent Vitals/Pain Pain Assessment: Faces Faces Pain Scale: Hurts even more Pain Location: right hip and side with sitting at EOB Pain Descriptors / Indicators: Grimacing;Guarding;Moaning Pain Intervention(s): Monitored during session;Repositioned    Home Living Family/patient expects to be discharged to:: Skilled nursing facility                 Additional Comments: in [facility]illed at [facility]     Prior  Function Level of Independence: Needs assistance   Gait / Transfers Assistance Needed: was ambulatory in a WC per CG, but requires 24 hr assistance for transfers  ADL's / Homemaking Assistance Needed: requires assistance wtih bathing, dressing  Comments: was in hospital at [facility] s/p 2 weeks, went to SNF for therapy and became ill and returned here to hospital at [facility].      Hand Dominance    Dominant Hand: Right    Extremity/Trunk Assessment   Upper Extremity Assessment Upper Extremity Assessment: Generalized weakness    Lower Extremity Assessment Lower Extremity Assessment: Generalized weakness    Cervical / Trunk Assessment Cervical / Trunk Assessment: Kyphotic  Communication   Communication: Expressive difficulties;Other (comment) (slower responses)  Cognition Arousal/Alertness: Lethargic Behavior During Therapy: Flat affect Overall Cognitive Status: Impaired/Different from baseline Area of Impairment: Following commands;Problem solving                       Following Commands: Follows one step commands consistently;Follows multi-step commands inconsistently;Follows one step commands with increased time     Problem Solving: Slow processing;Requires verbal cues General Comments: Per CG, pt usually responsive to questions and able to perform with assisrtance.       General Comments General comments (skin integrity, edema, etc.): Cg present throughout session and provides background information    Exercises     Assessment/Plan    PT Assessment Patient needs continued PT services  PT Problem List Decreased strength;Decreased activity tolerance;Decreased balance;Decreased mobility;Decreased cognition;Decreased safety awareness;Pain       PT Treatment Interventions Functional mobility training;Therapeutic activities;Therapeutic exercise;Balance training    PT Goals (Current goals can be found in the Care Plan section)  Acute Rehab PT Goals Patient Stated Goal: none stated PT Goal Formulation: Patient unable to participate in goal setting Time For Goal Achievement: 11/13/16 Potential to Achieve Goals: Fair    Frequency Min 2X/week   Barriers to discharge        Co-evaluation               End of Session Equipment Utilized During Treatment: Gait belt Activity Tolerance: Patient limited by fatigue Patient left: in chair;with call  bell/phone within reach;with family/visitor present Nurse Communication: Mobility status PT Visit Diagnosis: Muscle weakness (generalized) (M62.81);History of falling (Z91.81)    Time: 4081-4481 PT Time Calculation (min) (ACUTE ONLY): 32 min   Charges:   PT Evaluation $PT Eval Moderate Complexity: 1 Procedure PT Treatments $Therapeutic Activity: 8-22 mins   PT G Codes:   PT G-Codes **NOT FOR INPATIENT CLASS** Functional Assessment Tool Used: AM-PAC 6 Clicks Basic Mobility;Clinical judgement Functional Limitation: Mobility: Walking and moving around Mobility: Walking and Moving Around Current Status (E5631): At least 60 percent but less than 80 percent impaired, limited or restricted Mobility: Walking and Moving Around Goal Status [phone]): At least 20 percent but less than 40 percent impaired, limited or restricted    Scheryl Marten PT, DPT  [phone]   Shanon Rosser 11/06/2016, 9:06 AM

## 2016-11-06 NOTE — Progress Notes (Signed)
PROGRESS NOTE   [name]  URK:[id number]    DOB: 1934/10/24    DOA: 11/04/2016  PCP: No PCP Per Patient   I have briefly reviewed patients previous medical records in [facility].  Brief Narrative:  43 ?  cerebrovascular disease,  dementia,  COPD,  anxiety, depression,  diverticulosis,  GERD,  HLD,  HTN,  hypothyroid,   Parkinson's disease,   up to 2 weeks ago recited at an assisted living facility, ambulated with the help of a walker, apparently able to hold a good conversation, hospitalized at [facility] within the last 2 weeks for? Rectal bleeding but family opted nonaggressive management due to advanced age, then discharged to SNF  , presented to ED with altered mental status and fall on 11/04/16   She was briefly hypoxic at 85% on room air which resolved when seen in the ED. Recent recurrent falls. Urine microscopy suspicious for UTI. CT head without acute findings.   Assessment & Plan:   Principal Problem:   Acute encephalopathy Active Problems:   Hypothyroidism   Anxiety state   Depression   Essential hypertension   GERD   Hyperlipidemia   UTI (urinary tract infection)   Anxiety and depression   Parkinson disease (HCC)   COPD (chronic obstructive pulmonary disease) (HCC)   Stroke (cerebrum) (HCC)   CKD (chronic kidney disease), stage III   Protein-calorie malnutrition, severe   1. Acute encephalopathy: Presumed due to UTI complicating underlying dementia. No focal deficits on exam. CT head without acute findings. No hypoxia when evaluated in ED. Treat for UTI and monitor closely. If does not respond appropriately in the next 24 hours, consider further evaluation. Changed to CLD and see if can tolerate the same--componenet of meds being off schedule for Prakinson's could cause confusion 2. UTI Citrobacter: Continue IV Rocephin pending urine culture results. 3. Frequent falls: Probably multifactorial related to advanced age, frail  physical health, multiple comorbidities, acute illness, are getting underlying dementia & deconditioning. PT evaluation and likely return to SNF level of care.  4. Hypothyroid: Last TSH was 0.628 on 07/08/15.  IV Synthroid--converted to PO levothyroxine while nothing by mouth 5. Anxiety and depression: on depakote 125 tid, lexapro20 and ativan 0.5 bid 6. Essential hypertension: Holding oral medications amlodipine as well as . Controlled. When necessary IV hydralazine. 7. GERD: IV PPI 8. Hyperlipidemia: Holding oral medications 9. Parkinson's disease: Resume Sinemet 4/10 tid 10. COPD: Stable. 11. History of CVA: Rectal aspirin. 12. Acute on chronic Stage III chronic kidney disease: At baseline and patient probably has normal creatinine. Presented with creatinine of 1.3. Holding IV fluids and follow BMP in am 13. Left lung base opacity, seen on chest x-ray: Unchanged since 10/03/16 and per radiology favoring mild scarring or atelectasis. Follow-up chest x-ray as outpatient 14. Severe malnutrition in the context of chronic illness: Per dietitian input when able to tolerate by mouth. 15. Dehydration: IV fluids. 16. Dementia: Discussion as above. 6. Adult failure to thrive: Multifactorial.   DVT prophylaxis: Lovenox Code Status: Full code Family Communication: Discussed in detail with patient's caregiver  Disposition: DC to SNF when medically stable.    Consultants:  None   Procedures:  None  Antimicrobials:  IV ceftriaxone    Subjective:  Awake and more alert usually is very conversant and oriented per her bedsdie tech Cannot give ROS  Objective:  Vitals:   11/05/16 1507 11/05/16 2031 11/05/16 2208 11/06/16 0559  BP: (!) 137/49  (!) 134/46 (!) 132/47  Pulse: 68  75 64  Resp: 17  18 18   Temp: 98.3 F (36.8 C)  98.8 F (37.1 C) 99.5 F (37.5 C)  TempSrc:      SpO2: 95% 98% 100% 100%  Weight:      Height:        Examination:  General exam: Elderly female, small built  and frail, chronically ill looking, lying comfortably propped up in bed and mouth breathing. Respiratory system: Clear to auscultation/poor inspiratory effort. Respiratory effort normal. Cardiovascular system: S1 & S2 heard, RRR. No JVD, murmurs Gastrointestinal system: Abdomen is nondistended, soft and nontender. No organomegaly or masses felt. Normal bowel sounds heard. Central nervous system: Mental status as above. No focal neurological deficits. Extremities: Spontaneously moves bilateral upper extremities symmetrically. Parkinsonian tremor of both upper extremities. Skin: Area of bruising without acute findings over upper mid forehead. Psychiatry: Judgement and insight impaired    Data Reviewed: I have personally reviewed following labs and imaging studies  CBC:  Recent Labs Lab 11/04/16 1847 11/05/16 0027  WBC 6.3 6.7  NEUTROABS 3.0  --   HGB 12.9 12.4  HCT 37.2 37.1  MCV 95.1 95.4  PLT 319 376   Basic Metabolic Panel:  Recent Labs Lab 11/04/16 1847 11/05/16 0027  NA 135 139  K 3.9 3.8  CL 100* 102  CO2 24 25  GLUCOSE 93 94  BUN 16 14  CREATININE 1.36* 1.33*  CALCIUM 9.4 9.4   Liver Function Tests:  Recent Labs Lab 11/04/16 1847  AST 14*  ALT 6*  ALKPHOS 54  BILITOT 0.6  PROT 6.9  ALBUMIN 3.0*   CBG:  Recent Labs Lab 11/05/16 0756  GLUCAP 79    Recent Results (from the past 240 hour(s))  Urine culture     Status: Abnormal (Preliminary result)   Collection Time: 11/04/16  5:45 PM  Result Value Ref Range Status   Specimen Description URINE, RANDOM  Final   Special Requests CX ADDED AT 2229 ON 283151  Final   Culture (A)  Final    >=100,000 COLONIES/mL CITROBACTER SPECIES SUSCEPTIBILITIES TO FOLLOW    Report Status PENDING  Incomplete  Culture, blood (Routine X 2) w Reflex to ID Panel     Status: None (Preliminary result)   Collection Time: 11/05/16 12:27 AM  Result Value Ref Range Status   Specimen Description BLOOD LEFT ANTECUBITAL   Final   Special Requests   Final    BOTTLES DRAWN AEROBIC AND ANAEROBIC Blood Culture adequate volume   Culture NO GROWTH 1 DAY  Final   Report Status PENDING  Incomplete  Culture, blood (Routine X 2) w Reflex to ID Panel     Status: None (Preliminary result)   Collection Time: 11/05/16 12:32 AM  Result Value Ref Range Status   Specimen Description BLOOD RIGHT ANTECUBITAL  Final   Special Requests   Final    BOTTLES DRAWN AEROBIC AND ANAEROBIC Blood Culture adequate volume   Culture NO GROWTH 1 DAY  Final   Report Status PENDING  Incomplete  MRSA PCR Screening     Status: Abnormal   Collection Time: 11/05/16  5:14 AM  Result Value Ref Range Status   MRSA by PCR POSITIVE (A) NEGATIVE Final    Comment:        The GeneXpert MRSA Assay (FDA approved for NASAL specimens only), is one component of a comprehensive MRSA colonization surveillance program. It is not intended to diagnose MRSA infection nor to guide or monitor treatment for  MRSA infections. CRITICAL RESULT CALLED TO, READ BACK BY AND VERIFIED WITH: RN [employer] [id number] [phone] MLM          Radiology Studies: Ct Head Wo Contrast  Result Date: 11/04/2016 CLINICAL DATA:  Altered mental status. Recent fall. Left forehead abrasion. Initial encounter. EXAM: CT HEAD WITHOUT CONTRAST TECHNIQUE: Contiguous axial images were obtained from the base of the skull through the vertex without intravenous contrast. COMPARISON:  10/14/2016 FINDINGS: Brain: No evidence of acute infarction, hemorrhage, hydrocephalus, extra-axial collection or mass lesion/mass effect. Mild diffuse cerebral atrophy and chronic small vessel disease. Vascular: No hyperdense vessel or unexpected calcification. Skull: Normal. Negative for fracture or focal lesion. Sinuses/Orbits: No acute finding. Other: None. IMPRESSION: No acute intracranial abnormality. Mild cerebral atrophy and chronic small vessel disease. Electronically Signed   By: Earle Gell M.D.   On:  11/04/2016 19:17   Dg Chest Port 1 View  Result Date: 11/04/2016 CLINICAL DATA:  Altered mental status EXAM: PORTABLE CHEST 1 VIEW COMPARISON:  10/03/2016 chest radiograph. FINDINGS: Stable cardiomediastinal silhouette with normal heart size and aortic atherosclerosis. No pneumothorax. No pleural effusion. Mild patchy left lung base opacity, not clearly changed. No pulmonary edema. IMPRESSION: Mild patchy left lung base opacity, not clearly changed since 10/03/2016, favor mild scarring or atelectasis. Aortic atherosclerosis. Electronically Signed   By: Ilona Sorrel M.D.   On: 11/04/2016 18:44        Scheduled Meds: . aspirin  150 mg Rectal Daily  . cefTRIAXone (ROCEPHIN)  IV  1 g Intravenous Q24H  . enoxaparin (LOVENOX) injection  40 mg Subcutaneous Q24H  . famotidine (PEPCID) IV  20 mg Intravenous Q12H  . levothyroxine  50 mcg Intravenous Daily  . neomycin-bacitracin-polymyxin   Topical q morning - 10a  . sodium chloride flush  3 mL Intravenous Q12H   Continuous Infusions:    LOS: 2 days    Verneita Griffes, MD [facility] ([phone]   If 7PM-7AM, please contact night-coverage [email or web address] Password [email or web address] 11/06/2016, 11:15 AM

## 2016-11-06 NOTE — Consult Note (Signed)
   [facility] CM Inpatient Consult   11/06/2016  [name] 1934/10/22 [id number]  Patient reviewed for re-admission. Chart review reveals per MD notes that the patient is a 82 year old female, PMH of cerebrovascular disease, dementia, COPD, anxiety, depression, diverticulosis, GERD, HLD, HTN, hypothyroid, CVA, Parkinson's disease, up to 2 weeks ago recited at an assisted living facility, ambulated with the help of a walker, apparently able to hold a good conversation, hospitalized at [facility] within the last 2 weeks for? Rectal bleeding but family opted nonaggressive management due to advanced age, then discharged to [facility] presented to ED with altered mental status and fall. She was briefly hypoxic at 85% on room air which resolved when seen in the ED. Recent recurrent falls. Urine microscopy suspicious for UTI. CT head without acute findings.  Patient is currently recommended for return to skilled nursing facility care.  No community care management needs noted at this time.  For questions or referrals, please contact:  Natividad Brood, RN BSN [facility] Liaison  [phone] business mobile phone Toll free office [phone]

## 2016-11-07 ENCOUNTER — Inpatient Hospital Stay (HOSPITAL_COMMUNITY): Payer: Medicare Other

## 2016-11-07 LAB — BASIC METABOLIC PANEL
Anion gap: 12 (ref 5–15)
BUN: 13 mg/dL (ref 6–20)
CHLORIDE: 104 mmol/L (ref 101–111)
CO2: 23 mmol/L (ref 22–32)
Calcium: 8.7 mg/dL — ABNORMAL LOW (ref 8.9–10.3)
Creatinine, Ser: 1 mg/dL (ref 0.44–1.00)
GFR calc non Af Amer: 52 mL/min — ABNORMAL LOW (ref >60)
Glucose, Bld: 66 mg/dL (ref 65–99)
POTASSIUM: 3.7 mmol/L (ref 3.5–5.1)
SODIUM: 139 mmol/L (ref 135–145)

## 2016-11-07 LAB — URINE CULTURE: Culture: >=100000 — AB

## 2016-11-07 MED ORDER — STARCH (THICKENING) PO POWD
ORAL | Status: DC | PRN
  Filled 2016-11-07 (×2): qty 227

## 2016-11-07 MED ORDER — FAMOTIDINE 20 MG PO TABS
20.0000 mg | ORAL_TABLET | Freq: Every day | ORAL | Status: DC
  Administered 2016-11-08 – 2016-11-09 (×2): 20 mg via ORAL
  Filled 2016-11-07 (×2): qty 1

## 2016-11-07 NOTE — NC FL2 (Signed)
[place] LEVEL OF CARE SCREENING TOOL     IDENTIFICATION  Patient Name: [name] Birthdate: [birth year] Sex: female Admission Date (Current Location): 11/04/2016  [facility] and [place] Number:  Herbalist and Address:  [facility] [place] [place] [address] [place], [place] [address]      Provider Number: [id number]  Attending Physician Name and Address:  Nita Sells, MD  Relative Name and Phone Number:       Current Level of Care: Hospital Recommended Level of Care: [place] Prior Approval Number:    Date Approved/Denied:   PASRR Number:  [id number]   Discharge Plan: SNF    Current Diagnoses: Patient Active Problem List   Diagnosis Date Noted  . Protein-calorie malnutrition, severe 11/05/2016  . COPD (chronic obstructive pulmonary disease) ([place]) 11/04/2016  . Stroke (cerebrum) ([place]) 11/04/2016  . CKD (chronic kidney disease), stage III 11/04/2016  . Acute encephalopathy 10/04/2016  . UTI (lower urinary tract infection) 10/17/2015  . Dementia in Alzheimer's disease 10/17/2015  . Encephalopathy, metabolic 40/04/2724  . Contusion, hip 07/07/2015  . Falls frequently 07/07/2015  . Cervical radiculopathy 07/07/2015  . Anxiety and depression 07/07/2015  . Gait disturbance 07/07/2015  . Hyperglycemia 07/07/2015  . FTT (failure to thrive) in adult 07/07/2015  . Parkinson disease ([place])   . UTI (urinary tract infection) 07/05/2015  . Chronic neck pain 07/05/2015  . Debility 07/05/2015  . Fatigue 01/19/2015  . Dysuria 01/19/2015  . Chronic restrictive lung disease 01/04/2015  . DOE (dyspnea on exertion) 11/19/2014  . Pulmonary hypertension, moderate to severe 11/19/2014  . Irregular heart beats 08/12/2014  . Aftercare following surgery of the circulatory system, [place] 04/19/2014  . Pain of left lower extremity-Knee 04/19/2014  . Weakness 02/11/2014  . Loss of weight 02/11/2014  . Valvular heart disease  01/19/2014  . Pulmonary hypertension 01/19/2014  . CAP (community acquired pneumonia) 01/08/2014  . Asthma with acute exacerbation 01/06/2014  . Fall 01/06/2014  . Aortic stenosis, mild 11/18/2013  . Preventative health care 11/18/2013  . Anemia, unspecified 11/18/2013  . Tremor 09/21/2013  . Dyspnea 05/18/2013  . Physical deconditioning 05/18/2013  . OA (osteoarthritis) of knee 11/17/2012  . Hyperlipidemia 11/04/2012  . Occlusion and stenosis of carotid artery without mention of cerebral infarction 03/06/2012  . ASTHMA 10/09/2010  . Sudden visual loss 01/10/2009  . TRANSIENT ISCHEMIC ATTACK 01/10/2009  . CEREBROVASCULAR DISEASE 11/19/2007  . BRONCHITIS, RECURRENT 11/19/2007  . DIVERTICULOSIS OF COLON 11/19/2007  . Hypothyroidism 08/15/2007  . Anxiety state 08/15/2007  . Depression 08/15/2007  . Essential hypertension 08/15/2007  . GERD 08/15/2007  . Osteoarthritis 08/15/2007  . BACK PAIN, LUMBAR 08/15/2007    Orientation RESPIRATION BLADDER Height & Weight     Self  O2 ([place]) Incontinent Weight: 139 lb 12.4 oz (63.4 kg) Height:  5\' 4"  (162.6 cm)  BEHAVIORAL SYMPTOMS/MOOD NEUROLOGICAL BOWEL NUTRITION STATUS      Incontinent Diet (see DC summary)  AMBULATORY STATUS COMMUNICATION OF NEEDS Skin   Extensive Assist Verbally Normal                       Personal Care Assistance Level of Assistance  Bathing, Feeding, Dressing Bathing Assistance: Maximum assistance Feeding assistance: Limited assistance Dressing Assistance: Maximum assistance     Functional Limitations Info             SPECIAL CARE FACTORS FREQUENCY  PT (By licensed PT), OT (By licensed OT)  PT Frequency: 5/wk OT Frequency: 5/wk            Contractures      Additional Factors Info  Code Status, Allergies, Isolation Precautions Code Status Info: FULL Allergies Info: Relafen Nabumetone, Lipitor Atorvastatin, Other, Penicillins     Isolation Precautions Info: MRSA     Current  Medications (11/07/2016):  This is the current hospital active medication list Current Facility-Administered Medications  Medication Dose Route Frequency Provider Last Rate Last Dose  . acetaminophen (TYLENOL) tablet 650 mg  650 mg Oral Q6H PRN Ivor Costa, MD       Or  . acetaminophen (TYLENOL) suppository 650 mg  650 mg Rectal Q6H PRN Ivor Costa, MD      . albuterol (PROVENTIL) (2.5 MG/3ML) 0.083% nebulizer solution 2.5 mg  2.5 mg Nebulization Q4H PRN Ivor Costa, MD      . aspirin suppository 150 mg  150 mg Rectal Daily Ivor Costa, MD   150 mg at 11/07/16 0855  . baclofen (LIORESAL) tablet 20 mg  20 mg Oral TID Nita Sells, MD   20 mg at 11/06/16 2257  . carbidopa-levodopa (SINEMET IR) 25-100 MG per tablet immediate release 1 tablet  1 tablet Oral TID Nita Sells, MD   1 tablet at 11/06/16 2257  . cefTRIAXone (ROCEPHIN) 1 g in dextrose 5 % 50 mL IVPB  1 g Intravenous Q24H Ivor Costa, MD   1 g at 11/06/16 2019  . Chlorhexidine Gluconate Cloth 2 % PADS 6 each  6 each Topical BID Nita Sells, MD   6 each at 11/06/16 2258  . enoxaparin (LOVENOX) injection 40 mg  40 mg Subcutaneous Q24H Ivor Costa, MD   40 mg at 11/07/16 0855  . famotidine (PEPCID) IVPB 20 mg premix  20 mg Intravenous Q12H Ivor Costa, MD   20 mg at 11/06/16 2257  . hydrALAZINE (APRESOLINE) injection 5 mg  5 mg Intravenous Q2H PRN Ivor Costa, MD      . levothyroxine (SYNTHROID, LEVOTHROID) tablet 88 mcg  88 mcg Oral QAC breakfast Nita Sells, MD      . lip balm (BLISTEX) ointment   Topical PRN Modena Jansky, MD   1 application at 37/16/96 1003  . mupirocin ointment (BACTROBAN) 2 %   Nasal BID Nita Sells, MD      . neomycin-bacitracin-polymyxin (NEOSPORIN) ointment   Topical q morning - 10a Modena Jansky, MD      . ondansetron [facility]) tablet 4 mg  4 mg Oral Q6H PRN Ivor Costa, MD       Or  . ondansetron [facility]) injection 4 mg  4 mg Intravenous Q6H PRN Ivor Costa, MD      . sodium chloride flush  (NS) 0.9 % injection 3 mL  3 mL Intravenous Q12H Ivor Costa, MD   3 mL at 11/07/16 7893     Discharge Medications: Please see discharge summary for a list of discharge medications.  Relevant Imaging Results:  Relevant Lab Results:   Additional Information SSN: [id number]  Jorge Ny, LCSW

## 2016-11-07 NOTE — Progress Notes (Signed)
Modified Barium Swallow Progress Note  Patient Details  Name: [name] MRN: [id number] Date of Birth: [birth year]  Today's Date: 11/07/2016  Modified Barium Swallow completed.  Full report located under Chart Review in the Imaging Section.  Brief recommendations include the following:  Clinical Impression  Ms. Ullman presents with a moderate oral and pharyngeal (sensory) dysphagia. Observed significant delayed oral transit, holding, sublingual spillage, and piecemeal swallowing. Ms. Montanye required mod-max verbal and tactile cues to initiate a swallow and for labial closure, likely due to dementia and pt's current mentation. Noted a mild delayed swallow initiation to the valleculae and pyriform sinuses due to reduced sensation. Trials of nectar thick liquids resulted in penetration to the cords and aspiration with a weak reflexive cough that was ineffective at clearing penetrates and aspirates. Given pt's cognition, significant oral phase impairments, and observations of penetration and aspiration during MBS, recommend Dys 1 solids, honey thick liquids, and meds crushed in puree. ST will f/u for dysphagia treatment and possible diet upgrade as mentation improves.    Swallow Evaluation Recommendations       SLP Diet Recommendations: Dysphagia 1 (Puree) solids;Honey thick liquids   Liquid Administration via: Cup;No straw;Spoon   Medication Administration: Crushed with puree   Supervision: Full supervision/cueing for compensatory strategies;Full assist for feeding   Compensations: Slow rate;Small sips/bites;Lingual sweep for clearance of pocketing;Minimize environmental distractions   Postural Changes: Seated upright at 90 degrees   Oral Care Recommendations: Oral care BID   Other Recommendations: Order thickener from pharmacy    Houston Siren 11/07/2016,3:27 PM

## 2016-11-07 NOTE — Progress Notes (Signed)
Nutrition Follow-up  DOCUMENTATION CODES:   Severe malnutrition in context of chronic illness  INTERVENTION:   -Magic Cup TID with meals  NUTRITION DIAGNOSIS:   Malnutrition related to chronic illness (dementia) as evidenced by percent weight loss, moderate depletions of muscle mass, severe depletion of muscle mass, moderate depletion of body fat, severe depletion of body fat.  Ongoing  GOAL:   Patient will meet greater than or equal to 90% of their needs  Progressing  MONITOR:   Diet advancement, PO intake, Supplement acceptance, Labs, Weight trends, Skin, I & O's  REASON FOR ASSESSMENT:   Low [place]    ASSESSMENT:   Kamya Watling is a 82 y.o. female with medical history significant of dementia, TIA, stroke, pulmonary hypertension, aortic stenosis, hyperlipidemia, COPD, GERD, hypothyroidism, depression, anxiety, carotid artery stenosis, CKD-3, who presents with altered mental status and fall.  Staff report pt is more awake and alert today.   Pt underwent MBSS and BSE earlier today. MBSS revealed moderate dysphagia and delayed oral transit, requiring moderate to max cues; recommending initiation of dysphagia 1 diet with honey thick liquids.   RD will add supplements [employer]) to assess with meeting nutritional needs.   Per CSW, plan to return to SNF once medically stable.   Labs reviewed.   Diet Order:  DIET - DYS 1 Room service appropriate? Yes; Fluid consistency: Nectar Thick  Skin:  Wound (see comment) (partial thickness abrasion to forehead)  Last BM:  PTA  Height:   Ht Readings from Last 1 Encounters:  11/05/16 5\' 4"  (1.626 m)    Weight:   Wt Readings from Last 1 Encounters:  11/05/16 139 lb 12.4 oz (63.4 kg)    Ideal Body Weight:  54.5 kg  BMI:  Body mass index is 23.99 kg/m.  Estimated Nutritional Needs:   Kcal:  1500-1700  Protein:  70-85 grams  Fluid:  1.5-1.7 L  EDUCATION NEEDS:   Education needs no appropriate at this  time  Kathaleya Mcduffee A. Jimmye Norman, RD, LDN, CDE Pager: [phone] After hours Pager: [phone]

## 2016-11-07 NOTE — Progress Notes (Addendum)
PROGRESS NOTE   [name]  KXF:[id number]    DOB: [birth year]    DOA: 11/04/2016  PCP: No PCP Per Patient   I have briefly reviewed patients previous medical records in [facility].  Brief Narrative:  22 fem  cerebrovascular disease,  dementia,  COPD,  anxiety, depression,  diverticulosis,  GERD,  HLD,  HTN,  hypothyroid,   Parkinson's disease,   up to 2 weeks ago recited at an assisted living facility, ambulated with the help of a walker, apparently able to hold a good conversation, hospitalized at [facility] within the last 2 weeks for? Rectal bleeding but family opted nonaggressive management due to advanced age, then discharged to SNF  presented to ED with altered mental status and fall on 11/04/16   She was briefly hypoxic at 85% on room air which resolved when seen in the ED. Recent recurrent falls. Urine microscopy suspicious for UTI. CT head without acute findings.   Assessment & Plan:   Principal Problem:   Acute encephalopathy Active Problems:   Hypothyroidism   Anxiety state   Depression   Essential hypertension   GERD   Hyperlipidemia   UTI (urinary tract infection)   Anxiety and depression   Parkinson disease (H[facility])   COPD (chronic obstructive pulmonary disease) (H[facility])   Stroke (cerebrum) (H[facility])   CKD (chronic kidney disease), stage III   Protein-calorie malnutrition, severe   1. Acute encephalopathy: Presumed due to UTI complicating underlying dementia. No focal deficits on exam. CT head without acute findings. No hypoxia when evaluated in ED. Treat for UTI and monitor closely. If does not respond appropriately in the next 24 hours, consider further evaluation. Changed to CLD and see if can tolerate the same--component of meds being off schedule for Parkinson's could cause confusion--Keep blinds open, keep patient awake during daytime and ensure good sleep-awake cycle 2. Cough/dysphagia-await SLP eval-will try for swallow eval  4/11-start Dys 1 diet.  cXR showed no PNA 3. UTI Citrobacter: change IV Rocephin to PO when able to take PO reliably 4. Frequent falls: Probably multifactorial related to advanced age, frail physical health, multiple comorbidities, acute illness, are getting underlying dementia & deconditioning. PT evaluation and likely return to SNF level of care.  5. Hypothyroid: Last TSH was 0.628 on 07/08/15.  IV Synthroid--converted to PO levothyroxine  6. Anxiety and depression: on depakote 125 tid, lexapro20 and ativan 0.5 bid 7. Essential hypertension: Holding oral medications amlodipine. Controlled. When necessary IV hydralazine. 8. GERD: IV PPI 9. Hyperlipidemia: Holding oral medications 10. Parkinson's disease: Resume Sinemet 4/10 tid 11. COPD: Stable. 12. History of CVA: Rectal aspirin. 13. Acute on chronic Stage III chronic kidney disease: At baseline and patient probably has normal creatinine. Presented with creatinine of 1.3. Holding IV fluids and follow BMP in am--resolving 14. Left lung base opacity, seen on chest x-ray: Unchanged since 10/03/16 and per radiology favoring mild scarring or atelectasis. Follow-up chest x-ray as outpatient 15. Severe malnutrition in the context of chronic illness: Per dietitian input when able to tolerate by mouth. 16. Dehydration: IV fluids. 17. Dementia: Discussion as above. 47. Adult failure to thrive: Multifactorial.   DVT prophylaxis: Lovenox Code Status: Full code Family Communication: Discussed in detail with patient's caregiver --called to discuss with HCPOA but no answer.  Left VM. Disposition: DC to SNF when medically stable.    Consultants:  None   Procedures:  None  Antimicrobials:  IV ceftriaxone    Subjective:  Awake but still poorly responsive  More alert yesterday No distress Coughed while eating per Aide Objective:  Vitals:   11/06/16 0559 11/06/16 1331 11/06/16 2029 11/07/16 0514  BP: (!) 132/47 (!) 160/61 (!) 135/50 (!) 128/45   Pulse: 64 82 67 (!) 59  Resp: 18 20 18 16   Temp: 99.5 F (37.5 C) 98.3 F (36.8 C) 98.7 F (37.1 C) 98.4 F (36.9 C)  TempSrc:      SpO2: 100% 96% 96% 100%  Weight:      Height:        Examination:  General exam: Elderly female, small built and frail, chronically ill looking, lying comfortably propped up in bed and mouth breathing. Respiratory system: Clear to auscultation/poor inspiratory effort. Respiratory effort normal. Cardiovascular system: S1 & S2 heard, RRR. No JVD, murmurs Gastrointestinal system: Abdomen is nondistended, soft and nontender. Central nervous system: Mental status as above. No focal neurological deficits. Extremities: Spontaneously moves bilateral upper extremities symmetrically. Parkinsonian tremor noted at times Skin: Area of bruising without acute findings over upper mid forehead. Psychiatry: Judgement and insight impaired    Data Reviewed: I have personally reviewed following labs and imaging studies  CBC:  Recent Labs Lab 11/04/16 1847 11/05/16 0027  WBC 6.3 6.7  NEUTROABS 3.0  --   HGB 12.9 12.4  HCT 37.2 37.1  MCV 95.1 95.4  PLT 319 161   Basic Metabolic Panel:  Recent Labs Lab 11/04/16 1847 11/05/16 0027 11/06/16 0743 11/07/16 0622  NA 135 139 140 139  K 3.9 3.8 3.7 3.7  CL 100* 102 108 104  CO2 24 25 22 23   GLUCOSE 93 94 71 66  BUN 16 14 15 13   CREATININE 1.36* 1.33* 1.18* 1.00  CALCIUM 9.4 9.4 8.7* 8.7*   Liver Function Tests:  Recent Labs Lab 11/04/16 1847  AST 14*  ALT 6*  ALKPHOS 54  BILITOT 0.6  PROT 6.9  ALBUMIN 3.0*   CBG:  Recent Labs Lab 11/05/16 0756  GLUCAP 79    Recent Results (from the past 240 hour(s))  Urine culture     Status: Abnormal   Collection Time: 11/04/16  5:45 PM  Result Value Ref Range Status   Specimen Description URINE, RANDOM  Final   Special Requests CX ADDED AT 2229 ON 096045  Final   Culture >=100,000 COLONIES/mL CITROBACTER SPECIES (A)  Final   Report Status  11/07/2016 FINAL  Final   Organism ID, Bacteria CITROBACTER SPECIES (A)  Final      Susceptibility   Citrobacter species - MIC*    CEFAZOLIN >=64 RESISTANT Resistant     CEFTRIAXONE <=1 SENSITIVE Sensitive     CIPROFLOXACIN <=0.25 SENSITIVE Sensitive     GENTAMICIN <=1 SENSITIVE Sensitive     IMIPENEM 0.5 SENSITIVE Sensitive     NITROFURANTOIN <=16 SENSITIVE Sensitive     TRIMETH/SULFA <=20 SENSITIVE Sensitive     PIP/TAZO <=4 SENSITIVE Sensitive     * >=100,000 COLONIES/mL CITROBACTER SPECIES  Culture, blood (Routine X 2) w Reflex to ID Panel     Status: None (Preliminary result)   Collection Time: 11/05/16 12:27 AM  Result Value Ref Range Status   Specimen Description BLOOD LEFT ANTECUBITAL  Final   Special Requests   Final    BOTTLES DRAWN AEROBIC AND ANAEROBIC Blood Culture adequate volume   Culture NO GROWTH 2 DAYS  Final   Report Status PENDING  Incomplete  Culture, blood (Routine X 2) w Reflex to ID Panel     Status: None (Preliminary result)  Collection Time: 11/05/16 12:32 AM  Result Value Ref Range Status   Specimen Description BLOOD RIGHT ANTECUBITAL  Final   Special Requests   Final    BOTTLES DRAWN AEROBIC AND ANAEROBIC Blood Culture adequate volume   Culture NO GROWTH 2 DAYS  Final   Report Status PENDING  Incomplete  MRSA PCR Screening     Status: Abnormal   Collection Time: 11/05/16  5:14 AM  Result Value Ref Range Status   MRSA by PCR POSITIVE (A) NEGATIVE Final    Comment:        The GeneXpert MRSA Assay (FDA approved for NASAL specimens only), is one component of a comprehensive MRSA colonization surveillance program. It is not intended to diagnose MRSA infection nor to guide or monitor treatment for MRSA infections. CRITICAL RESULT CALLED TO, READ BACK BY AND VERIFIED WITH: RN [employer] [id number] [phone] MLM      Radiology Studies: No results found.   Scheduled Meds: . aspirin  150 mg Rectal Daily  . carbidopa-levodopa  1 tablet Oral TID  .  cefTRIAXone (ROCEPHIN)  IV  1 g Intravenous Q24H  . Chlorhexidine Gluconate Cloth  6 each Topical BID  . enoxaparin (LOVENOX) injection  40 mg Subcutaneous Q24H  . famotidine (PEPCID) IV  20 mg Intravenous Q12H  . levothyroxine  88 mcg Oral QAC breakfast  . mupirocin ointment   Nasal BID  . neomycin-bacitracin-polymyxin   Topical q morning - 10a  . sodium chloride flush  3 mL Intravenous Q12H   Continuous Infusions:    LOS: 3 days    Verneita Griffes, MD [facility] ([phone]   If 7PM-7AM, please contact night-coverage [email or web address] Password [email or web address] 11/07/2016, 11:50 AM

## 2016-11-07 NOTE — Evaluation (Cosign Needed)
Clinical/Bedside Swallow Evaluation Patient Details  Name: [name] MRN: [id number] Date of Birth: 12/25/1934  Today's Date: 11/07/2016 Time: SLP Start Time (ACUTE ONLY): 0825 SLP Stop Time (ACUTE ONLY): 0838 SLP Time Calculation (min) (ACUTE ONLY): 13 min  Past Medical History:  Past Medical History:  Diagnosis Date  . Acute cystitis   . Anemia   . Anxiety   . Aortic stenosis, mild 11/18/2013  . Asthma   . Back pain   . Bronchitis, acute   . Carotid artery occlusion   . Cerebrovascular disease   . COPD (chronic obstructive pulmonary disease) ([place])   . Depression   . Diverticulosis of colon   . DJD (degenerative joint disease)   . Fall 07/05/2015  . GERD (gastroesophageal reflux disease)   . History of sudden visual loss   . Hyperlipidemia   . Hypertension   . Hypothyroidism   . Pneumonia 2015   hx of x 2   . Pulmonary hypertension, moderate to severe 11/19/2014  . Shortness of breath    with exertion   . Stroke [facility])    2010 partial blind in left eye   . Transient ischemic attack   . UTI (lower urinary tract infection)    hx of    Past Surgical History:  Past Surgical History:  Procedure Laterality Date  . CARDIAC CATHETERIZATION  02/18/14  . CARDIOTHORACIC PROCEDURE  01/13/2009   Right   . COLECTOMY    . ENDARTERECTOMY     right carotid endartarectomy - 2010  . FOOT SURGERY     nerve cut between toes   . LEFT AND RIGHT HEART CATHETERIZATION WITH CORONARY ANGIOGRAM N/A 02/18/2014   Procedure: LEFT AND RIGHT HEART CATHETERIZATION WITH CORONARY ANGIOGRAM;  Surgeon: Larey Dresser, MD;  Location: [facility] CATH LAB;  Service: Cardiovascular;  Laterality: N/A;  . REPLACEMENT TOTAL KNEE     right and left knee  . TEE WITHOUT CARDIOVERSION N/A 02/08/2014   Procedure: TRANSESOPHAGEAL ECHOCARDIOGRAM (TEE);  Surgeon: Larey Dresser, MD;  Location: [place];  Service: Cardiovascular;  Laterality: N/A;  . TOTAL KNEE ARTHROPLASTY Left 11/17/2012   Procedure: TOTAL KNEE  ARTHROPLASTY;  Surgeon: Gearlean Alf, MD;  Location: [facility];  Service: Orthopedics;  Laterality: Left;  . TUBAL LIGATION    . VAGINAL HYSTERECTOMY     HPI:  Ptis a 82 y.o.femalewith PMH significant of dementia, Parkinson's, TIA, stroke, pulmonary hypertension, aortic stenosis, hyperlipidemia, COPD, GERD, hypothyroidism, depression, anxiety, carotid artery stenosis, CKD-3, who presents with altered mental status, UTI, and a fall. Patient has AMS and is nonverbal. Per report, patient could talk and walk at normal baseline. CT of head showed no acute intracranial abnormality. Mild cerebral atrophy and chronic small vessel disease. CXR showed mild patchy left lung base opacity, not clearly changed since 10/03/2016, favor mild scarring or atelectasis. Aortic atherosclerosis.   Assessment / Plan / Recommendation Clinical Impression  [name] was lethargic/drowsy during the bedside swallow evaluation and required verbal/tactile stimulation for arousal. Pt's caregiver/aid reported that [name] has had difficulty with swallowing and frequent and consistent "choking" with all textures and consitencies, which has occurred before her hospital admission. Trials of thin liquids via straw resulted in immediate, explosive coughing and throat clearing. Due to complaints of swallowing difficulties and clinical s/s of aspiration at bedside, an instrumental swallowing evaluation would be beneficial to assess the pt's swallowing mechanism and to determine the least restrictive diet. Educated pt and caregiver/aid re: MBS and diet. Recommend Dys 1  solids, nectar thick liquids, meds crushed in puree. Diet recommendations may change after completion of MBS (scheduled today at 1300).  SLP Visit Diagnosis: Dysphagia, unspecified (R13.10)    Aspiration Risk  Moderate aspiration risk    Diet Recommendation Dysphagia 1 (Puree);Nectar-thick liquid   Liquid Administration via: Cup;No straw Medication Administration:  Crushed with puree Supervision: Full supervision/cueing for compensatory strategies;Staff to assist with self feeding Compensations: Slow rate;Small sips/bites;Minimize environmental distractions Postural Changes: Seated upright at 90 degrees    Other  Recommendations Oral Care Recommendations: Oral care BID Other Recommendations: Order thickener from pharmacy   Follow up Recommendations Other (comment) (TBD)      Frequency and Duration            Prognosis        Swallow Study   General HPI: Ptis a 82 y.o.femalewith PMH significant of dementia, Parkinson's, TIA, stroke, pulmonary hypertension, aortic stenosis, hyperlipidemia, COPD, GERD, hypothyroidism, depression, anxiety, carotid artery stenosis, CKD-3, who presents with altered mental status, UTI, and a fall. Patient has AMS and is nonverbal. Per report, patient could talk and walk at normal baseline. CT of head showed no acute intracranial abnormality. Mild cerebral atrophy and chronic small vessel disease. CXR showed mild patchy left lung base opacity, not clearly changed since 10/03/2016, favor mild scarring or atelectasis. Aortic atherosclerosis. Type of Study: Bedside Swallow Evaluation Previous Swallow Assessment: 10/18/2015 MBS Diet Prior to this Study: Other (Comment) (clear liquids) Temperature Spikes Noted: No Respiratory Status: Nasal cannula History of Recent Intubation: No Behavior/Cognition: Lethargic/Drowsy;Cooperative;Pleasant mood Oral Cavity Assessment: Within Functional Limits Oral Care Completed by SLP: No Oral Cavity - Dentition: Edentulous Vision: Functional for self-feeding Self-Feeding Abilities: Needs assist;Needs set up Patient Positioning: Upright in bed Volitional Cough: Weak Volitional Swallow: Able to elicit    Oral/Motor/Sensory Function Overall Oral Motor/Sensory Function: Within functional limits   Ice Chips Ice chips: Not tested   Thin Liquid Thin Liquid: Impaired Presentation:  Cup;Straw Oral Phase Impairments:  (none) Oral Phase Functional Implications:  (none) Pharyngeal  Phase Impairments: Cough - Immediate;Throat Clearing - Immediate    Nectar Thick Nectar Thick Liquid: Not tested   Honey Thick Honey Thick Liquid: Not tested   Puree Puree: Not tested   Solid   GO   Solid: Not tested        Fransisca Kaufmann , Student-SLP 11/07/2016,12:29 PM

## 2016-11-07 NOTE — Clinical Social Work Note (Signed)
Clinical Social Work Assessment  Patient Details  Name: [name] MRN: [id number] Date of Birth: [birth year]  Date of referral:  11/07/16               Reason for consult:  Facility Placement, Discharge Planning                Permission sought to share information with:  Facility Sport and exercise psychologist, Family Supports Permission granted to share information::  No  Name::     [name]  Agency::  [facility]  Relationship::  Daughter  Contact Information:  [phone]  Housing/Transportation Living arrangements for the past 2 months:  [place], [place] of Information:  Adult Children Patient Interpreter Needed:  None Criminal Activity/Legal Involvement Pertinent to Current Situation/Hospitalization:  No - Comment as needed Significant Relationships:  Adult Children Lives with:  Self, Adult Children Do you feel safe going back to the place where you live?  Yes Need for family participation in patient care:  Yes (Comment)  Care giving concerns:  CSW received consult regarding discharge planning. Patient is disoriented. CSW spoke with patient's daughter and HCPOA. Patient was at [facility] for two days before she needed to come to the hospital. Patient's daughter would like her to return there at discharge. CSW to continue to follow and assist with discharge planning needs.   Social Worker assessment / plan:  CSW spoke with patient's daughter concerning possibility of returning to rehab at [facility] [place] before returning home.  Employment status:  Retired Forensic scientist:  Medicare PT Recommendations:  [place] / Referral to community resources:  [place]  Patient/Family's Response to care:  Patient's daughter recognizes need for rehab before returning home and is agreeable to patient returning to [facility]. Facility agreeable to accepting patient back barring bed  availability.  Patient/Family's Understanding of and Emotional Response to Diagnosis, Current Treatment, and Prognosis:  Patient/family is realistic regarding therapy needs and expressed being hopeful for SNF placement. Patient's daughter expressed understanding of CSW role and discharge process. No questions/concerns about plan or treatment. She is aware of her mother's condition and hopes she can return to therapy since this happened so quickly.  Emotional Assessment Appearance:  Appears stated age Attitude/Demeanor/Rapport:  Unable to Assess Affect (typically observed):  Unable to Assess Orientation:  Oriented to Self Alcohol / Substance use:  Not Applicable Psych involvement (Current and /or in the community):  No (Comment)  Discharge Needs  Concerns to be addressed:  Care Coordination Readmission within the last 30 days:  No Current discharge risk:  None Barriers to Discharge:  Continued Medical Work up   [employer], [place] 11/07/2016, 10:57 AM

## 2016-11-08 MED ORDER — AMLODIPINE BESYLATE 5 MG PO TABS
5.0000 mg | ORAL_TABLET | Freq: Every day | ORAL | Status: DC
  Administered 2016-11-08 – 2016-11-09 (×2): 5 mg via ORAL
  Filled 2016-11-08 (×2): qty 1

## 2016-11-08 MED ORDER — CIPROFLOXACIN HCL 500 MG PO TABS
500.0000 mg | ORAL_TABLET | Freq: Two times a day (BID) | ORAL | Status: DC
  Administered 2016-11-09: 500 mg via ORAL
  Filled 2016-11-08: qty 1

## 2016-11-08 MED ORDER — ESCITALOPRAM OXALATE 10 MG PO TABS
5.0000 mg | ORAL_TABLET | Freq: Every day | ORAL | Status: DC
  Administered 2016-11-08 – 2016-11-09 (×2): 5 mg via ORAL
  Filled 2016-11-08 (×2): qty 1

## 2016-11-08 NOTE — Progress Notes (Signed)
  Speech Language Pathology Treatment: Dysphagia  Patient Details Name: [name] MRN: [id number] DOB: [birth year] Today's Date: 11/08/2016 Time: 9485-4627 SLP Time Calculation (min) (ACUTE ONLY): 18 min  Assessment / Plan / Recommendation Clinical Impression  Pt observed with honey thick juice and puree with caregiver present. She demonstrated decreased cohesion with oral holding evidenced by juice spilling sublingually and pt with open mouth posture requiring max verbal cues to manipulate (close mouth, push tongue to roof of oral cavity) with several trials with timelier transit as session progressed. Pt will need full supervision and cues. Continue honey thick liquids and Dys 2 texture and continue ST at next venue of care.    HPI HPI: Ptis a 82 y.o.femalewith PMH significant of dementia, Parkinson's, TIA, stroke, pulmonary hypertension, aortic stenosis, hyperlipidemia, COPD, GERD, hypothyroidism, depression, anxiety, carotid artery stenosis, CKD-3, who presents with altered mental status, UTI, and a fall. Patient has AMS and is nonverbal. Per report, patient could talk and walk at normal baseline. CT of head showed no acute intracranial abnormality. Mild cerebral atrophy and chronic small vessel disease. CXR showed mild patchy left lung base opacity, not clearly changed since 10/03/2016, favor mild scarring or atelectasis. Aortic atherosclerosis.      SLP Plan  Continue with current plan of care       Recommendations  Diet recommendations: Dysphagia 2 (fine chop);Honey-thick liquid Liquids provided via: Cup;No straw Medication Administration: Crushed with puree Supervision: Patient able to self feed;Full supervision/cueing for compensatory strategies Compensations: Slow rate;Small sips/bites;Lingual sweep for clearance of pocketing;Minimize environmental distractions Postural Changes and/or Swallow Maneuvers: Seated upright 90 degrees                Oral Care  Recommendations: Oral care BID Follow up Recommendations: Skilled Nursing facility SLP Visit Diagnosis: Dysphagia, oral phase (R13.11);Dysphagia, pharyngeal phase (R13.13) Plan: Continue with current plan of care       GO                Houston Siren 11/08/2016, 1:21 PM  Orbie Pyo Colvin Caroli.Ed [employer] [phone]

## 2016-11-08 NOTE — Progress Notes (Signed)
Patient's swallowing and overall mentation improved since this AM. She is taking PO meds crushed in applesauce well and is able to verbalize her needs. Pt alert and sitting in chair at this time and tolerating well. Caregiver at bedside. Will cont to monitor.

## 2016-11-08 NOTE — Progress Notes (Signed)
PROGRESS NOTE   [name]  GQQ:[id number]    DOB: Jul 28, 1935    DOA: 11/04/2016  PCP: No PCP Per Patient   I have briefly reviewed patients previous medical records in [facility]  Brief Narrative:  53 fem  cerebrovascular disease,  dementia,  COPD,  anxiety, depression,  diverticulosis,  GERD,  HLD,  HTN,  hypothyroid,   Parkinson's disease,   up to 2 weeks ago recited at an assisted living facility, ambulated with the help of a walker, apparently able to hold a good conversation, hospitalized at [facility] within the last 2 weeks for? Rectal bleeding but family opted nonaggressive management due to advanced age, then discharged to SNF  presented to ED with altered mental status and fall on 11/04/16   She was briefly hypoxic at 85% on room air which resolved when seen in the ED. Recent recurrent falls. Urine microscopy suspicious for UTI. CT head without acute findings.   Assessment & Plan:   Principal Problem:   Acute encephalopathy Active Problems:   Hypothyroidism   Anxiety state   Depression   Essential hypertension   GERD   Hyperlipidemia   UTI (urinary tract infection)   Anxiety and depression   Parkinson disease (HCC)   COPD (chronic obstructive pulmonary disease) (HCC)   Stroke (cerebrum) (HCC)   CKD (chronic kidney disease), stage III   Protein-calorie malnutrition, severe   1. Acute encephalopathy: Presumed due to UTI complicating underlying dementia. No focal deficits on exam. CT head without acute findings. No hypoxia when evaluated in ED. Treat for UTI-improving  meds being off schedule for Parkinson's could cause confusion--Keep blinds open, keep patient awake during daytime and ensure good sleep-awake cycle 2. Cough/dysphagia- swallow eval 4/11-start Dys 1 diet.  cXR showed no PNA-desat screen needed 3. UTI Citrobacter: change IV Rocephin to PO when able to take PO reliably 4. Frequent falls: Probably multifactorial related to  advanced age, frail physical health, multiple comorbidities, acute illness, are getting underlying dementia & deconditioning. PT evaluation and likely return to SNF level of care.  5. Hypothyroid: Last TSH was 0.628 on 07/08/15.  IV Synthroid--converted to PO levothyroxine  6. Anxiety and depression: on depakote 125 tid, lexapro20 and ativan 0.5 bid--resumed only Lexapro at low dose of 5 mg 4/12 7. Essential hypertension: resumed 4/11 amlodipine 5. Controlled. When necessary IV hydralazine. 8. GERD: IV PPI 9. Hyperlipidemia: Holding oral medications-OP re-implement 10. Parkinson's disease: Resume Sinemet 4/10 tid-essential to take for awake-sleep as above 11. COPD: Stable. 12. History of CVA: Rectal aspirin. 13. Acute on chronic Stage III chronic kidney disease: At baseline and patient probably has normal creatinine. Presented with creatinine of 1.3. Holding IV fluids and follow BMP in am--resolving-creat no 1 14. Left lung base opacity, seen on chest x-ray: Unchanged since 10/03/16 and per radiology favoring mild scarring or atelectasis. Follow-up chest x-ray as outpatient 15. Severe malnutrition in the context of chronic illness: Per dietitian input when able to tolerate by mouth. 16. Dehydration: IV fluids. 17. Dementia: Discussion as above. 4. Adult failure to thrive: Multifactorial.   DVT prophylaxis: Lovenox Code Status: Full code Family Communication: Discussed in detail with patient's caregiver --called to discuss with HCPOA but no answer.  Left VM. Disposition: DC to SNF when medically stable.    Consultants:  None   Procedures:  None  Antimicrobials:  IV ceftriaxone    Subjective:   More awake alert interactive speaking today for the first time No cp Aide is  present No n/v 1 large stool Can recall all that she had for breakfast--was able to tell didn't have grits   Objective:  Vitals:   11/07/16 0514 11/07/16 1408 11/07/16 2148 11/08/16 0647  BP: (!) 128/45  (!) 129/46 (!) 133/46 (!) 130/44  Pulse: (!) 59 (!) 42 63 63  Resp: 16 20 17 17   Temp: 98.4 F (36.9 C) 97.4 F (36.3 C) 98.3 F (36.8 C) 98 F (36.7 C)  TempSrc:  Oral Oral Oral  SpO2: 100%  100% 100%  Weight:      Height:        Examination:  General exam: Elderly female, small built and frail, chronically ill looking, ednetulous Respiratory system: Clear to auscultation/poor inspiratory effort. Respiratory effort normal. Cardiovascular system: S1 & S2 heard, RRR. No JVD, murmurs Gastrointestinal system: Abdomen is nondistended, soft and nontender. Central nervous system: Mental status improved. Extremities: Spontaneously moves bilateral upper extremities symmetrically. Parkinsonian tremor noted at times Skin: Area of bruising without acute findings over upper mid forehead.  Sacrum no wound Psychiatry: Judgement and insight impaired    Data Reviewed: I have personally reviewed following labs and imaging studies  CBC:  Recent Labs Lab 11/04/16 1847 11/05/16 0027  WBC 6.3 6.7  NEUTROABS 3.0  --   HGB 12.9 12.4  HCT 37.2 37.1  MCV 95.1 95.4  PLT 319 474   Basic Metabolic Panel:  Recent Labs Lab 11/04/16 1847 11/05/16 0027 11/06/16 0743 11/07/16 0622  NA 135 139 140 139  K 3.9 3.8 3.7 3.7  CL 100* 102 108 104  CO2 24 25 22 23   GLUCOSE 93 94 71 66  BUN 16 14 15 13   CREATININE 1.36* 1.33* 1.18* 1.00  CALCIUM 9.4 9.4 8.7* 8.7*   Liver Function Tests:  Recent Labs Lab 11/04/16 1847  AST 14*  ALT 6*  ALKPHOS 54  BILITOT 0.6  PROT 6.9  ALBUMIN 3.0*   CBG:  Recent Labs Lab 11/05/16 0756  GLUCAP 79     Radiology Studies: Dg Chest Port 1 View  Result Date: 11/07/2016 CLINICAL DATA:  Shortness of breath EXAM: PORTABLE CHEST 1 VIEW COMPARISON:  11/04/2016 FINDINGS: Unchanged hazy density at the peripheral left chest, likely chronic since at least 2016 chest x-ray. There is subpleural reticulation at the left base based on 10/17/2015 abdominal CT.  Unchanged interstitial prominence, likely bronchial calcification. There is no edema, consolidation, effusion, or pneumothorax. Normal heart size mediastinal contours IMPRESSION: Stable compared to prior.  No evidence of acute disease. Electronically Signed   By: Monte Fantasia M.D.   On: 11/07/2016 12:34   Dg Swallowing Func-speech Pathology  Result Date: 11/07/2016 Objective Swallowing Evaluation: Type of Study: MBS-Modified Barium Swallow Study Patient Details Name: [name] MRN: [id number] Date of Birth: 30-Dec-1934 Today's Date: 11/07/2016 Time: SLP Start Time (ACUTE ONLY): 1314-SLP Stop Time (ACUTE ONLY): 1332 SLP Time Calculation (min) (ACUTE ONLY): 18 min Past Medical History: Past Medical History: Diagnosis Date . Acute cystitis  . Anemia  . Anxiety  . Aortic stenosis, mild 11/18/2013 . Asthma  . Back pain  . Bronchitis, acute  . Carotid artery occlusion  . Cerebrovascular disease  . COPD (chronic obstructive pulmonary disease) ([place])  . Depression  . Diverticulosis of colon  . DJD (degenerative joint disease)  . Fall 07/05/2015 . GERD (gastroesophageal reflux disease)  . History of sudden visual loss  . Hyperlipidemia  . Hypertension  . Hypothyroidism  . Pneumonia 2015  hx of x 2  . Pulmonary  hypertension, moderate to severe 11/19/2014 . Shortness of breath   with exertion  . 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 Transient ischemic attack  . UTI (lower urinary tract infection)   hx of  Past Surgical History: Past Surgical History: Procedure Laterality Date . CARDIAC CATHETERIZATION  02/18/14 . CARDIOTHORACIC PROCEDURE  01/13/2009  Right  . COLECTOMY   . ENDARTERECTOMY    right carotid endartarectomy - 2010 . FOOT SURGERY    nerve cut between toes  . LEFT AND RIGHT HEART CATHETERIZATION WITH CORONARY ANGIOGRAM N/A 02/18/2014  Procedure: LEFT AND RIGHT HEART CATHETERIZATION WITH CORONARY ANGIOGRAM;  Surgeon: Larey Dresser, MD;  Location[facility] CATH LAB;  Service: Cardiovascular;  Laterality: N/A; .  REPLACEMENT TOTAL KNEE    right and left knee . TEE WITHOUT CARDIOVERSION N/A 02/08/2014  Procedure: TRANSESOPHAGEAL ECHOCARDIOGRAM (TEE);  Surgeon: Larey Dresser, MD;  Location: [facility];  Service: Cardiovascular;  Laterality: N/A; . TOTAL KNEE ARTHROPLASTY Left 11/17/2012  Procedure: TOTAL KNEE ARTHROPLASTY;  Surgeon: Gearlean Alf, MD;  Location: [facility];  Service: Orthopedics;  Laterality: Left; . TUBAL LIGATION   . VAGINAL HYSTERECTOMY   HPI: Ptis a 82 y.o.femalewith PMH significant of dementia, Parkinson's, TIA, stroke, pulmonary hypertension, aortic stenosis, hyperlipidemia, COPD, GERD, hypothyroidism, depression, anxiety, carotid artery stenosis, CKD-3, who presents with altered mental status, UTI, and a fall. Patient has AMS and is nonverbal. Per report, patient could talk and walk at normal baseline. CT of head showed no acute intracranial abnormality. Mild cerebral atrophy and chronic small vessel disease. CXR showed mild patchy left lung base opacity, not clearly changed since 10/03/2016, favor mild scarring or atelectasis. Aortic atherosclerosis. No Data Recorded Assessment / Plan / Recommendation CHL IP CLINICAL IMPRESSIONS 11/07/2016 Clinical Impression Ms. Linders presents with a moderate oral and pharyngeal (sensory) dysphagia. Observed significant delayed oral transit, holding, sublingual spillage, and piecemeal swallowing. Ms. Tallman required mod-max verbal and tactile cues to initiate a swallow and for labial closure, likely due to dementia and pt's current mentation. Noted a mild delayed swallow initiation to the valleculae and pyriform sinuses due to reduced sensation. Trials of nectar thick liquids resulted in penetration to the cords and aspiration with a weak reflexive cough that was ineffective at clearing penetrates and aspirates. Given pt's cognition, significant oral phase impairments, and observations of penetration and aspiration during MBS, recommend Dys 1 solids, honey thick  liquids, and meds crushed in puree. ST will f/u for dysphagia treatment and possible diet upgrade as mentation improves.  SLP Visit Diagnosis Dysphagia, oral phase (R13.11);Dysphagia, pharyngeal phase (R13.13) Attention and concentration deficit following -- Frontal lobe and executive function deficit following -- Impact on safety and function Moderate aspiration risk;Severe aspiration risk   CHL IP TREATMENT RECOMMENDATION 11/07/2016 Treatment Recommendations Therapy as outlined in treatment plan below   Prognosis 11/07/2016 Prognosis for Safe Diet Advancement Fair Barriers to Reach Goals Severity of deficits;Cognitive deficits Barriers/Prognosis Comment -- CHL IP DIET RECOMMENDATION 11/07/2016 SLP Diet Recommendations Dysphagia 1 (Puree) solids;Honey thick liquids Liquid Administration via Cup;No straw;Spoon Medication Administration Crushed with puree Compensations Slow rate;Small sips/bites;Lingual sweep for clearance of pocketing;Minimize environmental distractions Postural Changes Seated upright at 90 degrees   CHL IP OTHER RECOMMENDATIONS 11/07/2016 Recommended Consults -- Oral Care Recommendations Oral care BID Other Recommendations Order thickener from pharmacy   CHL IP FOLLOW UP RECOMMENDATIONS 11/07/2016 Follow up Recommendations Skilled Nursing facility   [facility] IP FREQUENCY AND DURATION 11/07/2016 Speech Therapy Frequency (ACUTE ONLY) min 2x/week  Treatment Duration 2 weeks      CHL IP ORAL PHASE 11/07/2016 Oral Phase Impaired Oral - Pudding Teaspoon -- Oral - Pudding Cup -- Oral - Honey Teaspoon -- Oral - Honey Cup Delayed oral transit;Holding of bolus;Weak lingual manipulation;Lingual/palatal residue;Other (Comment) Oral - Nectar Teaspoon -- Oral - Nectar Cup Delayed oral transit;Holding of bolus;Lingual/palatal residue;Weak lingual manipulation Oral - Nectar Straw -- Oral - Thin Teaspoon -- Oral - Thin Cup -- Oral - Thin Straw -- Oral - Puree Delayed oral transit;Holding of bolus;Lingual/palatal residue;Weak  lingual manipulation;Piecemeal swallowing Oral - Mech Soft -- Oral - Regular -- Oral - Multi-Consistency -- Oral - Pill -- Oral Phase - Comment --  CHL IP PHARYNGEAL PHASE 11/07/2016 Pharyngeal Phase Impaired Pharyngeal- Pudding Teaspoon -- Pharyngeal -- Pharyngeal- Pudding Cup -- Pharyngeal -- Pharyngeal- Honey Teaspoon -- Pharyngeal -- Pharyngeal- Honey Cup Pharyngeal residue - valleculae;Delayed swallow initiation-pyriform sinuses;Delayed swallow initiation-vallecula Pharyngeal -- Pharyngeal- Nectar Teaspoon -- Pharyngeal -- Pharyngeal- Nectar Cup Penetration/Aspiration during swallow;Delayed swallow initiation-vallecula Pharyngeal Material enters airway, CONTACTS cords and not ejected out;Material enters airway, passes BELOW cords and not ejected out despite cough attempt by patient Pharyngeal- Nectar Straw -- Pharyngeal -- Pharyngeal- Thin Teaspoon -- Pharyngeal -- Pharyngeal- Thin Cup -- Pharyngeal -- Pharyngeal- Thin Straw -- Pharyngeal -- Pharyngeal- Puree Delayed swallow initiation-vallecula Pharyngeal -- Pharyngeal- Mechanical Soft -- Pharyngeal -- Pharyngeal- Regular -- Pharyngeal -- Pharyngeal- Multi-consistency -- Pharyngeal -- Pharyngeal- Pill -- Pharyngeal -- Pharyngeal Comment --  CHL IP CERVICAL ESOPHAGEAL PHASE 11/07/2016 Cervical Esophageal Phase WFL Pudding Teaspoon -- Pudding Cup -- Honey Teaspoon -- Honey Cup -- Nectar Teaspoon -- Nectar Cup -- Nectar Straw -- Thin Teaspoon -- Thin Cup -- Thin Straw -- Puree -- Mechanical Soft -- Regular -- Multi-consistency -- Pill -- Cervical Esophageal Comment -- CHL IP GO 08/05/2015 Functional Assessment Tool Used MBS, clinical judgement Functional Limitations Swallowing Swallow Current Status (Y1017) CI Swallow Goal Status (P1025) CI Swallow Discharge Status (E5277) CI Motor Speech Current Status (O2423) (None) Motor Speech Goal Status (N3614) (None) Motor Speech Goal Status (E3154) (None) Spoken Language Comprehension Current Status (M0867) (None) Spoken  Language Comprehension Goal Status (Y1950) (None) Spoken Language Comprehension Discharge Status (D3267) (None) Spoken Language Expression Current Status (T2458) (None) Spoken Language Expression Goal Status (K9983) (None) Spoken Language Expression Discharge Status (J8250) (None) Attention Current Status (N3976) (None) Attention Goal Status (B3419) (None) Attention Discharge Status (F7902) (None) Memory Current Status (I0973) (None) Memory Goal Status (Z3299) (None) Memory Discharge Status (M4268) (None) Voice Current Status (T4196) (None) Voice Goal Status (Q2297) (None) Voice Discharge Status (L8921) (None) Other Speech-Language Pathology Functional Limitation Current Status (J9417) (None) Other Speech-Language Pathology Functional Limitation Goal Status (E0814) (None) Other Speech-Language Pathology Functional Limitation Discharge Status [phone]) (None) [place] Siren 11/07/2016, 3:20 PM Orbie Pyo Colvin Caroli.Ed CCC-SLP Pager [phone]              Scheduled Meds: . amLODipine  5 mg Oral Daily  . aspirin  150 mg Rectal Daily  . carbidopa-levodopa  1 tablet Oral TID  . cefTRIAXone (ROCEPHIN)  IV  1 g Intravenous Q24H  . Chlorhexidine Gluconate Cloth  6 each Topical BID  . enoxaparin (LOVENOX) injection  40 mg Subcutaneous Q24H  . escitalopram  5 mg Oral Daily  . famotidine  20 mg Oral Daily  . levothyroxine  88 mcg Oral QAC breakfast  . mupirocin ointment   Nasal BID  . neomycin-bacitracin-polymyxin   Topical q morning - 10a  . sodium chloride flush  3 mL Intravenous Q12H   Continuous Infusions:    LOS: 4 days    Verneita Griffes, MD [facility]   If 7PM-7AM, please contact night-coverage [email or web address] Password [facility] 11/08/2016, 9:49 AM

## 2016-11-09 DIAGNOSIS — G309 Alzheimer's disease, unspecified: Secondary | ICD-10-CM | POA: Diagnosis not present

## 2016-11-09 DIAGNOSIS — F418 Other specified anxiety disorders: Secondary | ICD-10-CM | POA: Diagnosis not present

## 2016-11-09 DIAGNOSIS — R2681 Unsteadiness on feet: Secondary | ICD-10-CM | POA: Diagnosis not present

## 2016-11-09 DIAGNOSIS — R4182 Altered mental status, unspecified: Secondary | ICD-10-CM | POA: Diagnosis not present

## 2016-11-09 DIAGNOSIS — G9341 Metabolic encephalopathy: Secondary | ICD-10-CM | POA: Diagnosis not present

## 2016-11-09 DIAGNOSIS — R278 Other lack of coordination: Secondary | ICD-10-CM | POA: Diagnosis not present

## 2016-11-09 DIAGNOSIS — K529 Noninfective gastroenteritis and colitis, unspecified: Secondary | ICD-10-CM | POA: Diagnosis not present

## 2016-11-09 DIAGNOSIS — J449 Chronic obstructive pulmonary disease, unspecified: Secondary | ICD-10-CM | POA: Diagnosis not present

## 2016-11-09 DIAGNOSIS — G9340 Encephalopathy, unspecified: Secondary | ICD-10-CM | POA: Diagnosis not present

## 2016-11-09 DIAGNOSIS — I9589 Other hypotension: Secondary | ICD-10-CM | POA: Diagnosis not present

## 2016-11-09 DIAGNOSIS — R2689 Other abnormalities of gait and mobility: Secondary | ICD-10-CM | POA: Diagnosis not present

## 2016-11-09 DIAGNOSIS — G20 Parkinson's disease: Secondary | ICD-10-CM | POA: Diagnosis not present

## 2016-11-09 DIAGNOSIS — M6281 Muscle weakness (generalized): Secondary | ICD-10-CM | POA: Diagnosis not present

## 2016-11-09 DIAGNOSIS — R1312 Dysphagia, oropharyngeal phase: Secondary | ICD-10-CM | POA: Diagnosis not present

## 2016-11-09 DIAGNOSIS — E440 Moderate protein-calorie malnutrition: Secondary | ICD-10-CM | POA: Diagnosis not present

## 2016-11-09 DIAGNOSIS — N390 Urinary tract infection, site not specified: Secondary | ICD-10-CM | POA: Diagnosis not present

## 2016-11-09 DIAGNOSIS — R4586 Emotional lability: Secondary | ICD-10-CM | POA: Diagnosis not present

## 2016-11-09 DIAGNOSIS — R1319 Other dysphagia: Secondary | ICD-10-CM | POA: Diagnosis not present

## 2016-11-09 LAB — CBC
HEMATOCRIT: 31.8 % — AB (ref 36.0–46.0)
HEMOGLOBIN: 10.6 g/dL — AB (ref 12.0–15.0)
MCH: 31.8 pg (ref 26.0–34.0)
MCHC: 33.3 g/dL (ref 30.0–36.0)
MCV: 95.5 fL (ref 78.0–100.0)
Platelets: 297 10*3/uL (ref 150–400)
RBC: 3.33 MIL/uL — ABNORMAL LOW (ref 3.87–5.11)
RDW: 13 % (ref 11.5–15.5)
WBC: 9.3 10*3/uL (ref 4.0–10.5)

## 2016-11-09 MED ORDER — ESCITALOPRAM OXALATE 5 MG PO TABS: 5.0000 mg | ORAL_TABLET | Freq: Every day | ORAL | 0 refills | Status: DC

## 2016-11-09 MED ORDER — STARCH (THICKENING) PO POWD: 1.0000 | ORAL | 0 refills | Status: DC | PRN

## 2016-11-09 NOTE — Discharge Summary (Addendum)
Physician Discharge Summary  [name] VFI:[id number] DOB: 05/06/1935 DOA: 11/04/2016  PCP: No PCP Per Patient  Admit date: 11/04/2016 Discharge date: 11/09/2016  Time spent: 45 minutes  Recommendations for Outpatient Follow-up:  1. recommend goals of care at facility--long discussion with daugther-has been dwindling for ~ 6 mo with poor appetite and increasing weakness.  She is now DNR 2. Stopped nonessential meds-d/c Depakote as well as Diazepam as neurotoxic and could worsen parkinson's.  Please at Nursing facility attempt to schedule good sleep-wake cyle and schedule Carbi-dopa at the same time daily 3. Would get labs peridically 4. Keep on Dys 1 diet with thick-it for honey thick consistency  Discharge Diagnoses:  Principal Problem:   Acute encephalopathy Active Problems:   Hypothyroidism   Anxiety state   Depression   Essential hypertension   GERD   Hyperlipidemia   UTI (urinary tract infection)   Anxiety and depression   Parkinson disease (HCC)   COPD (chronic obstructive pulmonary disease) (HCC)   Stroke (cerebrum) (HCC)   CKD (chronic kidney disease), stage III   Protein-calorie malnutrition, severe   Discharge Condition: poor  Diet recommendation: dys 1  Filed Weights   11/05/16 0005  Weight: 63.4 kg (139 lb 12.4 oz)    History of present illness:   5 fem  cerebrovascular disease,  dementia,  COPD,  anxiety, depression,  diverticulosis,  GERD,  HLD,  HTN,  hypothyroid,   Parkinson's disease,   up to 2 weeks ago recited at an assisted living facility, ambulated with the help of a walker, apparently able to hold a good conversation, hospitalized at [facility] within the last 2 weeks for? Rectal bleeding but family opted nonaggressive management due to advanced age, then discharged to SNF  Has had decline-was at [facility] has had 2 falls in the past MO-now no longer able to really care for herself and not eating and less active and  mobile over a 4-6 mo period of time  presented to ED with altered mental status and fall on 11/04/16   She was briefly hypoxic at 85% on room air which resolved when seen in the ED.  Urine microscopy suspicious for UTI. CT head without acute findings.  Hospital Course:  1. Acute encephalopathy: Presumed due to UTI complicating underlying dementia. No focal deficits on exam. CT head without acute findings. No hypoxia when evaluated in ED. Treated with IV Rocephin while hosp for UTI-improving very slowly meds being off schedule for Parkinson's could cause confusion--Keep blinds open, keep patient awake during daytime and ensure good sleep-awake cycle---see 17 2. Cough/dysphagia- swallow eval 4/11-start Dys 1 diet.  cXR showed no PNA--High high risk on-going to Aspiration--see 17 3. UTI Citrobacter: change IV Rocephin to PO when able to take PO reliably 4. Frequent falls: Probably multifactorial related to advanced age, frail physical health,  And parkinson's. y return to SNF level of care.  5. Hypothyroid: Last TSH was 0.628 on 07/08/15.  IV Synthroid--converted to PO levothyroxine on 4/10 as taking in some PO 6. Anxiety and depression: on depakote 125 tid, lexapro 20 and ativan 0.5 bid--resumed only Lexapro at low dose of 5 mg 4/12 7. Essential hypertension: resumed 4/11 amlodipine 5.  8. GERD: IV PPI.  D/c PPI on d/c 9. Hyperlipidemia: Holding  Medications 10. Parkinson's disease: Resume Sinemet 4/10 tid-essential to take for awake-sleep as above 11. COPD: Stable. 12. History of CVA: cont ASA 13. Acute on chronic Stage III chronic kidney disease: At baseline and patient probably  has normal creatinine. Presented with creatinine of 1.3. Improved to 1.0 on d/c--labs periodically 14. Left lung base opacity, seen on chest x-ray: Unchanged since 10/03/16 and per radiology favoring mild scarring or atelectasis. Follow-up chest x-ray as outpatient 15. Severe malnutrition in the context of chronic illness:  Per dietitian input when able to tolerate by mouth. 16. Dehydration: IV fluids. 17. Dementia: Discussion as above. --long and detailed discussion with daughter who confirms progressive decline, poor appetite and overall Adult FTT.  Confirms would not want aggressive resuscitation.   Discharge Exam: Vitals:   11/08/16 2104 11/09/16 0612  BP: (!) 126/43 (!) 141/50  Pulse: (!) 59 69  Resp: 17 17  Temp: 98.6 F (37 C) 98.4 F (36.9 C)    General: eomi alert pallor no icterus Cardiovascular: s1 s2 no m/r/g Respiratory: clear no added sound abd soft nt nd no rebound no gaurding Le soft nt  Discharge Instructions   Discharge Instructions    Diet - low sodium heart healthy    Complete by:  As directed    Increase activity slowly    Complete by:  As directed      Current Discharge Medication List    START taking these medications   Details  food thickener (THICK IT) POWD Take 1 Container by mouth as needed (to thicken liquids.). Refills: 0      CONTINUE these medications which have CHANGED   Details  escitalopram (LEXAPRO) 5 MG tablet Take 1 tablet (5 mg total) by mouth daily. Qty: 10 tablet, Refills: 0      CONTINUE these medications which have NOT CHANGED   Details  ADVAIR HFA 230-21 MCG/ACT inhaler Inhale 2 puffs into the lungs 2 (two) times daily. Qty: 1 Inhaler, Refills: 3    amLODipine (NORVASC) 5 MG tablet Take 1 tablet (5 mg total) by mouth daily. Must keep appt for future refills Qty: 30 tablet, Refills: 0   Associated Diagnoses: Essential hypertension    aspirin 81 MG chewable tablet Chew 81 mg by mouth daily.    carbidopa-levodopa (SINEMET IR) 25-100 MG tablet Take 1 tablet by mouth 3 (three) times daily. for parkinson's disease    fluticasone (FLONASE) 50 MCG/ACT nasal spray Place 1 spray into both nostrils daily.     levothyroxine (SYNTHROID) 88 MCG tablet Take 88 mcg by mouth daily at 6 (six) AM.     polyethylene glycol (MIRALAX / GLYCOLAX) packet  Take 17 g by mouth daily. Mix in 8 oz liquid and drink    senna-docusate (SENNA S) 8.6-50 MG tablet Take 1 tablet by mouth daily as needed (constipation).    sucralfate (CARAFATE) 1 g tablet Take 1 g by mouth 4 (four) times daily.       STOP taking these medications     baclofen (LIORESAL) 20 MG tablet      calcium-vitamin D (OSCAL WITH D) 500-200 MG-UNIT TABS tablet      diazepam (VALIUM) 10 MG tablet      divalproex (DEPAKOTE) 125 MG DR tablet      megestrol (MEGACE ES) 625 MG/5ML suspension      Omega-3 Fatty Acids (FISH OIL) 1000 MG CAPS      omeprazole (PRILOSEC) 20 MG capsule      pravastatin (PRAVACHOL) 20 MG tablet      traMADol (ULTRAM) 50 MG tablet      diazepam (VALIUM) 2 MG tablet      LORazepam (ATIVAN) 0.5 MG tablet        Allergies  Allergen Reactions  . Relafen [Nabumetone] Diarrhea and Other (See Comments)    GI / Urinary Bleeding  . Lipitor [Atorvastatin] Other (See Comments)    Causes memory loss  . Other Other (See Comments)    "orvail" unknown:  Causes Bloody stool  . Penicillins Hives, Itching, Swelling and Rash    Has patient had a PCN reaction causing immediate rash, facial/tongue/throat swelling, SOB or lightheadedness with hypotension: no Has patient had a PCN reaction causing severe rash involving mucus membranes or skin necrosis: unknown Has patient had a PCN reaction that required hospitalization no Has patient had a PCN reaction occurring within the last 10 years: no If all of the above answers are "NO", then may proceed with Cephalosporin use.       The results of significant diagnostics from this hospitalization (including imaging, microbiology, ancillary and laboratory) are listed below for reference.    Significant Diagnostic Studies: Ct Head Wo Contrast  Result Date: 11/04/2016 CLINICAL DATA:  Altered mental status. Recent fall. Left forehead abrasion. Initial encounter. EXAM: CT HEAD WITHOUT CONTRAST TECHNIQUE: Contiguous  axial images were obtained from the base of the skull through the vertex without intravenous contrast. COMPARISON:  10/14/2016 FINDINGS: Brain: No evidence of acute infarction, hemorrhage, hydrocephalus, extra-axial collection or mass lesion/mass effect. Mild diffuse cerebral atrophy and chronic small vessel disease. Vascular: No hyperdense vessel or unexpected calcification. Skull: Normal. Negative for fracture or focal lesion. Sinuses/Orbits: No acute finding. Other: None. IMPRESSION: No acute intracranial abnormality. Mild cerebral atrophy and chronic small vessel disease. Electronically Signed   By: Earle Gell M.D.   On: 11/04/2016 19:17   Ct Head Wo Contrast  Result Date: 10/14/2016 CLINICAL DATA:  82 year old female with fall and head injury. EXAM: CT HEAD WITHOUT CONTRAST TECHNIQUE: Contiguous axial images were obtained from the base of the skull through the vertex without intravenous contrast. COMPARISON:  Head CT dated 10/05/2016 FINDINGS: Brain: There is moderate age-related atrophy and chronic microvascular ischemic changes. There is no acute intracranial hemorrhage. No mass effect or midline shift noted. No extra-axial fluid collection. Vascular: No hyperdense vessel or unexpected calcification. Skull: Normal. Negative for fracture or focal lesion. Sinuses/Orbits: No acute finding. Other: Small left forehead hematoma. IMPRESSION: 1. No acute intracranial hemorrhage. 2. Moderate age-related atrophy and chronic microvascular ischemic changes. Electronically Signed   By: Anner Crete M.D.   On: 10/14/2016 23:18   Dg Chest Port 1 View  Result Date: 11/07/2016 CLINICAL DATA:  Shortness of breath EXAM: PORTABLE CHEST 1 VIEW COMPARISON:  11/04/2016 FINDINGS: Unchanged hazy density at the peripheral left chest, likely chronic since at least 2016 chest x-ray. There is subpleural reticulation at the left base based on 10/17/2015 abdominal CT. Unchanged interstitial prominence, likely bronchial  calcification. There is no edema, consolidation, effusion, or pneumothorax. Normal heart size mediastinal contours IMPRESSION: Stable compared to prior.  No evidence of acute disease. Electronically Signed   By: Monte Fantasia M.D.   On: 11/07/2016 12:34   Dg Chest Port 1 View  Result Date: 11/04/2016 CLINICAL DATA:  Altered mental status EXAM: PORTABLE CHEST 1 VIEW COMPARISON:  10/03/2016 chest radiograph. FINDINGS: Stable cardiomediastinal silhouette with normal heart size and aortic atherosclerosis. No pneumothorax. No pleural effusion. Mild patchy left lung base opacity, not clearly changed. No pulmonary edema. IMPRESSION: Mild patchy left lung base opacity, not clearly changed since 10/03/2016, favor mild scarring or atelectasis. Aortic atherosclerosis. Electronically Signed   By: Ilona Sorrel M.D.   On: 11/04/2016 18:44   Dg  Swallowing Func-speech Pathology  Result Date: 11/07/2016 Objective Swallowing Evaluation: Type of Study: MBS-Modified Barium Swallow Study Patient Details Name: Venda Dice MRN: [id number] Date of Birth: 1935/01/20 Today's Date: 11/07/2016 Time: SLP Start Time (ACUTE ONLY): 1314-SLP Stop Time (ACUTE ONLY): 1332 SLP Time Calculation (min) (ACUTE ONLY): 18 min Past Medical History: Past Medical History: Diagnosis Date . Acute cystitis  . Anemia  . Anxiety  . Aortic stenosis, mild 11/18/2013 . Asthma  . Back pain  . Bronchitis, acute  . Carotid artery occlusion  . Cerebrovascular disease  . COPD (chronic obstructive pulmonary disease) ([place])  . Depression  . Diverticulosis of colon  . DJD (degenerative joint disease)  . Fall 07/05/2015 . GERD (gastroesophageal reflux disease)  . History of sudden visual loss  . Hyperlipidemia  . Hypertension  . Hypothyroidism  . Pneumonia 2015  hx of x 2  . Pulmonary hypertension, moderate to severe 11/19/2014 . Shortness of breath   with exertion  . 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 Transient ischemic attack  . UTI (lower urinary tract  infection)   hx of  Past Surgical History: Past Surgical History: Procedure Laterality Date . CARDIAC CATHETERIZATION  02/18/14 . CARDIOTHORACIC PROCEDURE  01/13/2009  Right  . COLECTOMY   . ENDARTERECTOMY    right carotid endartarectomy - 2010 . FOOT SURGERY    nerve cut between toes  . LEFT AND RIGHT HEART CATHETERIZATION WITH CORONARY ANGIOGRAM N/A 02/18/2014  Procedure: LEFT AND RIGHT HEART CATHETERIZATION WITH CORONARY ANGIOGRAM;  Surgeon: Larey Dresser, MD;  Location: [facility] CATH LAB;  Service: Cardiovascular;  Laterality: N/A; . REPLACEMENT TOTAL KNEE    right and left knee . TEE WITHOUT CARDIOVERSION N/A 02/08/2014  Procedure: TRANSESOPHAGEAL ECHOCARDIOGRAM (TEE);  Surgeon: Larey Dresser, MD;  Location: [facility];  Service: Cardiovascular;  Laterality: N/A; . TOTAL KNEE ARTHROPLASTY Left 11/17/2012  Procedure: TOTAL KNEE ARTHROPLASTY;  Surgeon: Gearlean Alf, MD;  Location: [facility];  Service: Orthopedics;  Laterality: Left; . TUBAL LIGATION   . VAGINAL HYSTERECTOMY   HPI: Ptis a 82 y.o.femalewith PMH significant of dementia, Parkinson's, TIA, stroke, pulmonary hypertension, aortic stenosis, hyperlipidemia, COPD, GERD, hypothyroidism, depression, anxiety, carotid artery stenosis, CKD-3, who presents with altered mental status, UTI, and a fall. Patient has AMS and is nonverbal. Per report, patient could talk and walk at normal baseline. CT of head showed no acute intracranial abnormality. Mild cerebral atrophy and chronic small vessel disease. CXR showed mild patchy left lung base opacity, not clearly changed since 10/03/2016, favor mild scarring or atelectasis. Aortic atherosclerosis. No Data Recorded Assessment / Plan / Recommendation CHL IP CLINICAL IMPRESSIONS 11/07/2016 Clinical Impression Ms. Callander presents with a moderate oral and pharyngeal (sensory) dysphagia. Observed significant delayed oral transit, holding, sublingual spillage, and piecemeal swallowing. Ms. Ivie required mod-max verbal and  tactile cues to initiate a swallow and for labial closure, likely due to dementia and pt's current mentation. Noted a mild delayed swallow initiation to the valleculae and pyriform sinuses due to reduced sensation. Trials of nectar thick liquids resulted in penetration to the cords and aspiration with a weak reflexive cough that was ineffective at clearing penetrates and aspirates. Given pt's cognition, significant oral phase impairments, and observations of penetration and aspiration during MBS, recommend Dys 1 solids, honey thick liquids, and meds crushed in puree. ST will f/u for dysphagia treatment and possible diet upgrade as mentation improves.  SLP Visit Diagnosis Dysphagia, oral phase (R13.11);Dysphagia, pharyngeal phase (R13.13) Attention and  concentration deficit following -- Frontal lobe and executive function deficit following -- Impact on safety and function Moderate aspiration risk;Severe aspiration risk   CHL IP TREATMENT RECOMMENDATION 11/07/2016 Treatment Recommendations Therapy as outlined in treatment plan below   Prognosis 11/07/2016 Prognosis for Safe Diet Advancement Fair Barriers to Reach Goals Severity of deficits;Cognitive deficits Barriers/Prognosis Comment -- CHL IP DIET RECOMMENDATION 11/07/2016 SLP Diet Recommendations Dysphagia 1 (Puree) solids;Honey thick liquids Liquid Administration via Cup;No straw;Spoon Medication Administration Crushed with puree Compensations Slow rate;Small sips/bites;Lingual sweep for clearance of pocketing;Minimize environmental distractions Postural Changes Seated upright at 90 degrees   CHL IP OTHER RECOMMENDATIONS 11/07/2016 Recommended Consults -- Oral Care Recommendations Oral care BID Other Recommendations Order thickener from pharmacy   CHL IP FOLLOW UP RECOMMENDATIONS 11/07/2016 Follow up Recommendations Skilled Nursing facility   [facility] IP FREQUENCY AND DURATION 11/07/2016 Speech Therapy Frequency (ACUTE ONLY) min 2x/week Treatment Duration 2 weeks      CHL IP  ORAL PHASE 11/07/2016 Oral Phase Impaired Oral - Pudding Teaspoon -- Oral - Pudding Cup -- Oral - Honey Teaspoon -- Oral - Honey Cup Delayed oral transit;Holding of bolus;Weak lingual manipulation;Lingual/palatal residue;Other (Comment) Oral - Nectar Teaspoon -- Oral - Nectar Cup Delayed oral transit;Holding of bolus;Lingual/palatal residue;Weak lingual manipulation Oral - Nectar Straw -- Oral - Thin Teaspoon -- Oral - Thin Cup -- Oral - Thin Straw -- Oral - Puree Delayed oral transit;Holding of bolus;Lingual/palatal residue;Weak lingual manipulation;Piecemeal swallowing Oral - Mech Soft -- Oral - Regular -- Oral - Multi-Consistency -- Oral - Pill -- Oral Phase - Comment --  CHL IP PHARYNGEAL PHASE 11/07/2016 Pharyngeal Phase Impaired Pharyngeal- Pudding Teaspoon -- Pharyngeal -- Pharyngeal- Pudding Cup -- Pharyngeal -- Pharyngeal- Honey Teaspoon -- Pharyngeal -- Pharyngeal- Honey Cup Pharyngeal residue - valleculae;Delayed swallow initiation-pyriform sinuses;Delayed swallow initiation-vallecula Pharyngeal -- Pharyngeal- Nectar Teaspoon -- Pharyngeal -- Pharyngeal- Nectar Cup Penetration/Aspiration during swallow;Delayed swallow initiation-vallecula Pharyngeal Material enters airway, CONTACTS cords and not ejected out;Material enters airway, passes BELOW cords and not ejected out despite cough attempt by patient Pharyngeal- Nectar Straw -- Pharyngeal -- Pharyngeal- Thin Teaspoon -- Pharyngeal -- Pharyngeal- Thin Cup -- Pharyngeal -- Pharyngeal- Thin Straw -- Pharyngeal -- Pharyngeal- Puree Delayed swallow initiation-vallecula Pharyngeal -- Pharyngeal- Mechanical Soft -- Pharyngeal -- Pharyngeal- Regular -- Pharyngeal -- Pharyngeal- Multi-consistency -- Pharyngeal -- Pharyngeal- Pill -- Pharyngeal -- Pharyngeal Comment --  CHL IP CERVICAL ESOPHAGEAL PHASE 11/07/2016 Cervical Esophageal Phase WFL Pudding Teaspoon -- Pudding Cup -- Honey Teaspoon -- Honey Cup -- Nectar Teaspoon -- Nectar Cup -- Nectar Straw -- Thin  Teaspoon -- Thin Cup -- Thin Straw -- Puree -- Mechanical Soft -- Regular -- Multi-consistency -- Pill -- Cervical Esophageal Comment -- CHL IP GO 08/05/2015 Functional Assessment Tool Used MBS, clinical judgement Functional Limitations Swallowing Swallow Current Status (Z6109) CI Swallow Goal Status (U0454) CI Swallow Discharge Status (U9811) CI Motor Speech Current Status (B1478) (None) Motor Speech Goal Status (G9562) (None) Motor Speech Goal Status (Z3086) (None) Spoken Language Comprehension Current Status (V7846) (None) Spoken Language Comprehension Goal Status (N6295) (None) Spoken Language Comprehension Discharge Status (M8413) (None) Spoken Language Expression Current Status (K4401) (None) Spoken Language Expression Goal Status (U2725) (None) Spoken Language Expression Discharge Status (D6644) (None) Attention Current Status (I3474) (None) Attention Goal Status (Q5956) (None) Attention Discharge Status (L8756) (None) Memory Current Status (E3329) (None) Memory Goal Status (J1884) (None) Memory Discharge Status (Z6606) (None) Voice Current Status (T0160) (None) Voice Goal Status (F0932) (None) Voice Discharge Status (T5573) (None) Other Speech-Language Pathology Functional Limitation Current  Status [phone]) (None) Other Speech-Language Pathology Functional Limitation Goal Status (Y6378) (None) Other Speech-Language Pathology Functional Limitation Discharge Status [phone]) (None) [place] Siren 11/07/2016, 3:20 PM Orbie Pyo Colvin Caroli.Ed CCC-SLP Pager [phone]               Labs: Basic Metabolic Panel:  Recent Labs Lab 11/04/16 1847 11/05/16 0027 11/06/16 0743 11/07/16 0622  NA 135 139 140 139  K 3.9 3.8 3.7 3.7  CL 100* 102 108 104  CO2 24 25 22 23   GLUCOSE 93 94 71 66  BUN 16 14 15 13   CREATININE 1.36* 1.33* 1.18* 1.00  CALCIUM 9.4 9.4 8.7* 8.7*   Liver Function Tests:  Recent Labs Lab 11/04/16 1847  AST 14*  ALT 6*  ALKPHOS 54  BILITOT 0.6  PROT 6.9  ALBUMIN 3.0*   No  results for input(s): LIPASE, AMYLASE in the last 168 hours. No results for input(s): AMMONIA in the last 168 hours. CBC:  Recent Labs Lab 11/04/16 1847 11/05/16 0027 11/09/16 0537  WBC 6.3 6.7 9.3  NEUTROABS 3.0  --   --   HGB 12.9 12.4 10.6*  HCT 37.2 37.1 31.8*  MCV 95.1 95.4 95.5  PLT 319 342 297   Cardiac Enzymes: No results for input(s): CKTOTAL, CKMB, CKMBINDEX, TROPONINI in the last 168 hours. BNP: BNP (last 3 results)  Recent Labs  11/04/16 1847  BNP 48.6    ProBNP (last 3 results) No results for input(s): PROBNP in the last 8760 hours.  CBG:  Recent Labs Lab 11/05/16 0756  GLUCAP 79       Signed:  Nita Sells MD   [facility] 11/09/2016, 9:18 AM

## 2016-11-09 NOTE — Progress Notes (Signed)
[name] discharged Skilled nursing facility per MD order.  Report called to receiving [name] at [facility].   Call pt. Daughter [name] and reviewed discharge instructions, all questions answered. RN also clarified with pt. Daughter if she was a DNR, [name] stated that she wanted CPR done but no ventilation or machines.  Informed her she should complete a MOST form at the facility to states what she wants and doesn't want done. Copy of discharge instruction will be sent with pt. Private caregiver to give to daughter.    Allergies as of 11/09/2016      Reactions   Relafen [nabumetone] Diarrhea, Other (See Comments)   GI / Urinary Bleeding   Lipitor [atorvastatin] Other (See Comments)   Causes memory loss   Other Other (See Comments)   "orvail" unknown:  Causes Bloody stool   Penicillins Hives, Itching, Swelling, Rash   Has patient had a PCN reaction causing immediate rash, facial/tongue/throat swelling, SOB or lightheadedness with hypotension: no Has patient had a PCN reaction causing severe rash involving mucus membranes or skin necrosis: unknown Has patient had a PCN reaction that required hospitalization no Has patient had a PCN reaction occurring within the last 10 years: no If all of the above answers are "NO", then may proceed with Cephalosporin use.      Medication List    STOP taking these medications   baclofen 20 MG tablet Commonly known as:  LIORESAL   calcium-vitamin D 500-200 MG-UNIT Tabs tablet Commonly known as:  OSCAL WITH D   diazepam 10 MG tablet Commonly known as:  VALIUM   diazepam 2 MG tablet Commonly known as:  VALIUM   divalproex 125 MG DR tablet Commonly known as:  DEPAKOTE   Fish Oil 1000 MG Caps   LORazepam 0.5 MG tablet Commonly known as:  ATIVAN   megestrol 625 MG/5ML suspension Commonly known as:  MEGACE ES   omeprazole 20 MG capsule Commonly known as:  PRILOSEC   pravastatin 20 MG tablet Commonly known as:  PRAVACHOL    traMADol 50 MG tablet Commonly known as:  ULTRAM     TAKE these medications   ADVAIR HFA 230-21 MCG/ACT inhaler Generic drug:  fluticasone-salmeterol Inhale 2 puffs into the lungs 2 (two) times daily.   amLODipine 5 MG tablet Commonly known as:  NORVASC Take 1 tablet (5 mg total) by mouth daily. Must keep appt for future refills What changed:  additional instructions   aspirin 81 MG chewable tablet Chew 81 mg by mouth daily.   carbidopa-levodopa 25-100 MG tablet Commonly known as:  SINEMET IR Take 1 tablet by mouth 3 (three) times daily. for parkinson's disease   escitalopram 5 MG tablet Commonly known as:  LEXAPRO Take 1 tablet (5 mg total) by mouth daily. What changed:  medication strength  how much to take   fluticasone 50 MCG/ACT nasal spray Commonly known as:  FLONASE Place 1 spray into both nostrils daily.   food thickener Powd Commonly known as:  THICK IT Take 1 Container by mouth as needed (to thicken liquids.).   polyethylene glycol packet Commonly known as:  MIRALAX / GLYCOLAX Take 17 g by mouth daily. Mix in 8 oz liquid and drink   SENNA S 8.6-50 MG tablet Generic drug:  senna-docusate Take 1 tablet by mouth daily as needed (constipation).   sucralfate 1 g tablet Commonly known as:  CARAFATE Take 1 g by mouth 4 (four) times daily.   SYNTHROID 88 MCG tablet Generic drug:  levothyroxine Take 88 mcg by mouth daily at 6 (six) AM.       Patients skin is clean, dry, with abrasion to forehead with pink foam dressing intact. IV site discontinued and catheter remains intact. Site without signs and symptoms of complications. Dressing and pressure applied.  Patient transported on a stretcher by non emergent EMS,  no distress noted upon discharge.  [name], [name] 11/09/2016 3:06 PM

## 2016-11-09 NOTE — Progress Notes (Signed)
Patient will DC to: [facility] Anticipated DC date: 11/09/16 Family notified: Daughter Transport by: Corey Harold   Per MD patient ready for DC to [facility]. RN, patient, patient's family, and facility notified of DC. Discharge Summary sent to facility. RN given number for report. DC packet on chart. Ambulance transport requested for patient.   CSW signing off.  Cedric Fishman, [place] Social Worker [phone]

## 2016-11-09 NOTE — Care Management Note (Addendum)
Case Management Note  Patient Details  Name: [name] MRN: [id number] Date of Birth: 12-16-1934  Subjective/Objective:            Presents with altered mental status, UTI, and a fall, hx of dementia, Parkinson's, TIA, stroke, pulmonary hypertension, aortic stenosis, hyperlipidemia, COPD, GERD, hypothyroidism, depression, anxiety, carotid artery stenosis, CKD.     Action/Plan: Plan is to d/c back to SNF with palliative care today. CSW managing disposition back to facility.  Expected Discharge Date:  11/09/16               Expected Discharge Plan:  [place] ([facility])  In-House Referral:  Clinical Social Work  Discharge planning Services  CM Consult  Status of Service:  Completed, signed off  If discussed at [employer], dates discussed:    Additional Comments:  Sharin Mons, RN 11/09/2016, 9:26 AM

## 2016-11-10 LAB — CULTURE, BLOOD (ROUTINE X 2)
CULTURE: NO GROWTH
Culture: NO GROWTH
Special Requests: ADEQUATE
Special Requests: ADEQUATE

## 2016-11-18 DIAGNOSIS — I9589 Other hypotension: Secondary | ICD-10-CM | POA: Diagnosis not present

## 2016-11-18 DIAGNOSIS — J449 Chronic obstructive pulmonary disease, unspecified: Secondary | ICD-10-CM | POA: Diagnosis not present

## 2016-11-18 DIAGNOSIS — F418 Other specified anxiety disorders: Secondary | ICD-10-CM | POA: Diagnosis not present

## 2016-11-25 DIAGNOSIS — G20 Parkinson's disease: Secondary | ICD-10-CM | POA: Diagnosis not present

## 2016-11-25 DIAGNOSIS — R1319 Other dysphagia: Secondary | ICD-10-CM | POA: Diagnosis not present

## 2016-11-25 DIAGNOSIS — R4586 Emotional lability: Secondary | ICD-10-CM | POA: Diagnosis not present

## 2016-11-25 DIAGNOSIS — R2689 Other abnormalities of gait and mobility: Secondary | ICD-10-CM | POA: Diagnosis not present

## 2016-12-03 DIAGNOSIS — N390 Urinary tract infection, site not specified: Secondary | ICD-10-CM | POA: Diagnosis not present

## 2016-12-07 DIAGNOSIS — R1312 Dysphagia, oropharyngeal phase: Secondary | ICD-10-CM | POA: Diagnosis not present

## 2016-12-07 DIAGNOSIS — G309 Alzheimer's disease, unspecified: Secondary | ICD-10-CM | POA: Diagnosis not present

## 2016-12-07 DIAGNOSIS — F0280 Dementia in other diseases classified elsewhere without behavioral disturbance: Secondary | ICD-10-CM | POA: Diagnosis not present

## 2016-12-07 DIAGNOSIS — N183 Chronic kidney disease, stage 3 (moderate): Secondary | ICD-10-CM | POA: Diagnosis not present

## 2016-12-07 DIAGNOSIS — G20 Parkinson's disease: Secondary | ICD-10-CM | POA: Diagnosis not present

## 2016-12-07 DIAGNOSIS — I129 Hypertensive chronic kidney disease with stage 1 through stage 4 chronic kidney disease, or unspecified chronic kidney disease: Secondary | ICD-10-CM | POA: Diagnosis not present

## 2016-12-10 DIAGNOSIS — I129 Hypertensive chronic kidney disease with stage 1 through stage 4 chronic kidney disease, or unspecified chronic kidney disease: Secondary | ICD-10-CM | POA: Diagnosis not present

## 2016-12-10 DIAGNOSIS — R1312 Dysphagia, oropharyngeal phase: Secondary | ICD-10-CM | POA: Diagnosis not present

## 2016-12-10 DIAGNOSIS — G20 Parkinson's disease: Secondary | ICD-10-CM | POA: Diagnosis not present

## 2016-12-10 DIAGNOSIS — N183 Chronic kidney disease, stage 3 (moderate): Secondary | ICD-10-CM | POA: Diagnosis not present

## 2016-12-10 DIAGNOSIS — F0280 Dementia in other diseases classified elsewhere without behavioral disturbance: Secondary | ICD-10-CM | POA: Diagnosis not present

## 2016-12-10 DIAGNOSIS — G309 Alzheimer's disease, unspecified: Secondary | ICD-10-CM | POA: Diagnosis not present

## 2016-12-11 DIAGNOSIS — G309 Alzheimer's disease, unspecified: Secondary | ICD-10-CM | POA: Diagnosis not present

## 2016-12-11 DIAGNOSIS — F0280 Dementia in other diseases classified elsewhere without behavioral disturbance: Secondary | ICD-10-CM | POA: Diagnosis not present

## 2016-12-11 DIAGNOSIS — R1312 Dysphagia, oropharyngeal phase: Secondary | ICD-10-CM | POA: Diagnosis not present

## 2016-12-11 DIAGNOSIS — N183 Chronic kidney disease, stage 3 (moderate): Secondary | ICD-10-CM | POA: Diagnosis not present

## 2016-12-11 DIAGNOSIS — I129 Hypertensive chronic kidney disease with stage 1 through stage 4 chronic kidney disease, or unspecified chronic kidney disease: Secondary | ICD-10-CM | POA: Diagnosis not present

## 2016-12-11 DIAGNOSIS — G20 Parkinson's disease: Secondary | ICD-10-CM | POA: Diagnosis not present

## 2016-12-12 DIAGNOSIS — N390 Urinary tract infection, site not specified: Secondary | ICD-10-CM | POA: Diagnosis not present

## 2016-12-13 DIAGNOSIS — G20 Parkinson's disease: Secondary | ICD-10-CM | POA: Diagnosis not present

## 2016-12-13 DIAGNOSIS — I129 Hypertensive chronic kidney disease with stage 1 through stage 4 chronic kidney disease, or unspecified chronic kidney disease: Secondary | ICD-10-CM | POA: Diagnosis not present

## 2016-12-13 DIAGNOSIS — R1312 Dysphagia, oropharyngeal phase: Secondary | ICD-10-CM | POA: Diagnosis not present

## 2016-12-13 DIAGNOSIS — N183 Chronic kidney disease, stage 3 (moderate): Secondary | ICD-10-CM | POA: Diagnosis not present

## 2016-12-13 DIAGNOSIS — G309 Alzheimer's disease, unspecified: Secondary | ICD-10-CM | POA: Diagnosis not present

## 2016-12-13 DIAGNOSIS — F0280 Dementia in other diseases classified elsewhere without behavioral disturbance: Secondary | ICD-10-CM | POA: Diagnosis not present

## 2016-12-17 DIAGNOSIS — G309 Alzheimer's disease, unspecified: Secondary | ICD-10-CM | POA: Diagnosis not present

## 2016-12-17 DIAGNOSIS — N390 Urinary tract infection, site not specified: Secondary | ICD-10-CM | POA: Diagnosis not present

## 2016-12-17 DIAGNOSIS — G20 Parkinson's disease: Secondary | ICD-10-CM | POA: Diagnosis not present

## 2016-12-17 DIAGNOSIS — R1312 Dysphagia, oropharyngeal phase: Secondary | ICD-10-CM | POA: Diagnosis not present

## 2016-12-17 DIAGNOSIS — I129 Hypertensive chronic kidney disease with stage 1 through stage 4 chronic kidney disease, or unspecified chronic kidney disease: Secondary | ICD-10-CM | POA: Diagnosis not present

## 2016-12-17 DIAGNOSIS — N183 Chronic kidney disease, stage 3 (moderate): Secondary | ICD-10-CM | POA: Diagnosis not present

## 2016-12-17 DIAGNOSIS — F0280 Dementia in other diseases classified elsewhere without behavioral disturbance: Secondary | ICD-10-CM | POA: Diagnosis not present

## 2016-12-18 DIAGNOSIS — G309 Alzheimer's disease, unspecified: Secondary | ICD-10-CM | POA: Diagnosis not present

## 2016-12-18 DIAGNOSIS — R1312 Dysphagia, oropharyngeal phase: Secondary | ICD-10-CM | POA: Diagnosis not present

## 2016-12-18 DIAGNOSIS — G20 Parkinson's disease: Secondary | ICD-10-CM | POA: Diagnosis not present

## 2016-12-18 DIAGNOSIS — N183 Chronic kidney disease, stage 3 (moderate): Secondary | ICD-10-CM | POA: Diagnosis not present

## 2016-12-18 DIAGNOSIS — F0280 Dementia in other diseases classified elsewhere without behavioral disturbance: Secondary | ICD-10-CM | POA: Diagnosis not present

## 2016-12-18 DIAGNOSIS — I129 Hypertensive chronic kidney disease with stage 1 through stage 4 chronic kidney disease, or unspecified chronic kidney disease: Secondary | ICD-10-CM | POA: Diagnosis not present

## 2016-12-19 DIAGNOSIS — F0280 Dementia in other diseases classified elsewhere without behavioral disturbance: Secondary | ICD-10-CM | POA: Diagnosis not present

## 2016-12-19 DIAGNOSIS — N183 Chronic kidney disease, stage 3 (moderate): Secondary | ICD-10-CM | POA: Diagnosis not present

## 2016-12-19 DIAGNOSIS — G20 Parkinson's disease: Secondary | ICD-10-CM | POA: Diagnosis not present

## 2016-12-19 DIAGNOSIS — I129 Hypertensive chronic kidney disease with stage 1 through stage 4 chronic kidney disease, or unspecified chronic kidney disease: Secondary | ICD-10-CM | POA: Diagnosis not present

## 2016-12-19 DIAGNOSIS — R1312 Dysphagia, oropharyngeal phase: Secondary | ICD-10-CM | POA: Diagnosis not present

## 2016-12-19 DIAGNOSIS — G309 Alzheimer's disease, unspecified: Secondary | ICD-10-CM | POA: Diagnosis not present

## 2016-12-20 DIAGNOSIS — G309 Alzheimer's disease, unspecified: Secondary | ICD-10-CM | POA: Diagnosis not present

## 2016-12-20 DIAGNOSIS — F0280 Dementia in other diseases classified elsewhere without behavioral disturbance: Secondary | ICD-10-CM | POA: Diagnosis not present

## 2016-12-20 DIAGNOSIS — G20 Parkinson's disease: Secondary | ICD-10-CM | POA: Diagnosis not present

## 2016-12-20 DIAGNOSIS — I129 Hypertensive chronic kidney disease with stage 1 through stage 4 chronic kidney disease, or unspecified chronic kidney disease: Secondary | ICD-10-CM | POA: Diagnosis not present

## 2016-12-20 DIAGNOSIS — N183 Chronic kidney disease, stage 3 (moderate): Secondary | ICD-10-CM | POA: Diagnosis not present

## 2016-12-20 DIAGNOSIS — R1312 Dysphagia, oropharyngeal phase: Secondary | ICD-10-CM | POA: Diagnosis not present

## 2016-12-21 DIAGNOSIS — G20 Parkinson's disease: Secondary | ICD-10-CM | POA: Diagnosis not present

## 2016-12-21 DIAGNOSIS — R1312 Dysphagia, oropharyngeal phase: Secondary | ICD-10-CM | POA: Diagnosis not present

## 2016-12-21 DIAGNOSIS — I129 Hypertensive chronic kidney disease with stage 1 through stage 4 chronic kidney disease, or unspecified chronic kidney disease: Secondary | ICD-10-CM | POA: Diagnosis not present

## 2016-12-21 DIAGNOSIS — G309 Alzheimer's disease, unspecified: Secondary | ICD-10-CM | POA: Diagnosis not present

## 2016-12-21 DIAGNOSIS — N183 Chronic kidney disease, stage 3 (moderate): Secondary | ICD-10-CM | POA: Diagnosis not present

## 2016-12-21 DIAGNOSIS — F0280 Dementia in other diseases classified elsewhere without behavioral disturbance: Secondary | ICD-10-CM | POA: Diagnosis not present

## 2016-12-25 DIAGNOSIS — G309 Alzheimer's disease, unspecified: Secondary | ICD-10-CM | POA: Diagnosis not present

## 2016-12-25 DIAGNOSIS — I129 Hypertensive chronic kidney disease with stage 1 through stage 4 chronic kidney disease, or unspecified chronic kidney disease: Secondary | ICD-10-CM | POA: Diagnosis not present

## 2016-12-25 DIAGNOSIS — R1312 Dysphagia, oropharyngeal phase: Secondary | ICD-10-CM | POA: Diagnosis not present

## 2016-12-25 DIAGNOSIS — N183 Chronic kidney disease, stage 3 (moderate): Secondary | ICD-10-CM | POA: Diagnosis not present

## 2016-12-25 DIAGNOSIS — F0280 Dementia in other diseases classified elsewhere without behavioral disturbance: Secondary | ICD-10-CM | POA: Diagnosis not present

## 2016-12-25 DIAGNOSIS — G20 Parkinson's disease: Secondary | ICD-10-CM | POA: Diagnosis not present

## 2016-12-26 DIAGNOSIS — N183 Chronic kidney disease, stage 3 (moderate): Secondary | ICD-10-CM | POA: Diagnosis not present

## 2016-12-26 DIAGNOSIS — F0280 Dementia in other diseases classified elsewhere without behavioral disturbance: Secondary | ICD-10-CM | POA: Diagnosis not present

## 2016-12-26 DIAGNOSIS — G309 Alzheimer's disease, unspecified: Secondary | ICD-10-CM | POA: Diagnosis not present

## 2016-12-26 DIAGNOSIS — I129 Hypertensive chronic kidney disease with stage 1 through stage 4 chronic kidney disease, or unspecified chronic kidney disease: Secondary | ICD-10-CM | POA: Diagnosis not present

## 2016-12-26 DIAGNOSIS — G20 Parkinson's disease: Secondary | ICD-10-CM | POA: Diagnosis not present

## 2016-12-26 DIAGNOSIS — R1312 Dysphagia, oropharyngeal phase: Secondary | ICD-10-CM | POA: Diagnosis not present

## 2016-12-27 DIAGNOSIS — I129 Hypertensive chronic kidney disease with stage 1 through stage 4 chronic kidney disease, or unspecified chronic kidney disease: Secondary | ICD-10-CM | POA: Diagnosis not present

## 2016-12-27 DIAGNOSIS — R1312 Dysphagia, oropharyngeal phase: Secondary | ICD-10-CM | POA: Diagnosis not present

## 2016-12-27 DIAGNOSIS — G20 Parkinson's disease: Secondary | ICD-10-CM | POA: Diagnosis not present

## 2016-12-27 DIAGNOSIS — N183 Chronic kidney disease, stage 3 (moderate): Secondary | ICD-10-CM | POA: Diagnosis not present

## 2016-12-27 DIAGNOSIS — F0280 Dementia in other diseases classified elsewhere without behavioral disturbance: Secondary | ICD-10-CM | POA: Diagnosis not present

## 2016-12-27 DIAGNOSIS — G309 Alzheimer's disease, unspecified: Secondary | ICD-10-CM | POA: Diagnosis not present

## 2016-12-28 DIAGNOSIS — G309 Alzheimer's disease, unspecified: Secondary | ICD-10-CM | POA: Diagnosis not present

## 2016-12-28 DIAGNOSIS — F0280 Dementia in other diseases classified elsewhere without behavioral disturbance: Secondary | ICD-10-CM | POA: Diagnosis not present

## 2016-12-28 DIAGNOSIS — G20 Parkinson's disease: Secondary | ICD-10-CM | POA: Diagnosis not present

## 2016-12-28 DIAGNOSIS — R1312 Dysphagia, oropharyngeal phase: Secondary | ICD-10-CM | POA: Diagnosis not present

## 2016-12-28 DIAGNOSIS — N183 Chronic kidney disease, stage 3 (moderate): Secondary | ICD-10-CM | POA: Diagnosis not present

## 2016-12-28 DIAGNOSIS — I129 Hypertensive chronic kidney disease with stage 1 through stage 4 chronic kidney disease, or unspecified chronic kidney disease: Secondary | ICD-10-CM | POA: Diagnosis not present

## 2017-01-01 DIAGNOSIS — G309 Alzheimer's disease, unspecified: Secondary | ICD-10-CM | POA: Diagnosis not present

## 2017-01-01 DIAGNOSIS — N183 Chronic kidney disease, stage 3 (moderate): Secondary | ICD-10-CM | POA: Diagnosis not present

## 2017-01-01 DIAGNOSIS — F0280 Dementia in other diseases classified elsewhere without behavioral disturbance: Secondary | ICD-10-CM | POA: Diagnosis not present

## 2017-01-01 DIAGNOSIS — I129 Hypertensive chronic kidney disease with stage 1 through stage 4 chronic kidney disease, or unspecified chronic kidney disease: Secondary | ICD-10-CM | POA: Diagnosis not present

## 2017-01-01 DIAGNOSIS — R1312 Dysphagia, oropharyngeal phase: Secondary | ICD-10-CM | POA: Diagnosis not present

## 2017-01-01 DIAGNOSIS — G20 Parkinson's disease: Secondary | ICD-10-CM | POA: Diagnosis not present

## 2017-01-02 DIAGNOSIS — R1312 Dysphagia, oropharyngeal phase: Secondary | ICD-10-CM | POA: Diagnosis not present

## 2017-01-02 DIAGNOSIS — F0280 Dementia in other diseases classified elsewhere without behavioral disturbance: Secondary | ICD-10-CM | POA: Diagnosis not present

## 2017-01-02 DIAGNOSIS — N183 Chronic kidney disease, stage 3 (moderate): Secondary | ICD-10-CM | POA: Diagnosis not present

## 2017-01-02 DIAGNOSIS — G309 Alzheimer's disease, unspecified: Secondary | ICD-10-CM | POA: Diagnosis not present

## 2017-01-02 DIAGNOSIS — G20 Parkinson's disease: Secondary | ICD-10-CM | POA: Diagnosis not present

## 2017-01-02 DIAGNOSIS — I129 Hypertensive chronic kidney disease with stage 1 through stage 4 chronic kidney disease, or unspecified chronic kidney disease: Secondary | ICD-10-CM | POA: Diagnosis not present

## 2017-01-03 DIAGNOSIS — G309 Alzheimer's disease, unspecified: Secondary | ICD-10-CM | POA: Diagnosis not present

## 2017-01-03 DIAGNOSIS — F0280 Dementia in other diseases classified elsewhere without behavioral disturbance: Secondary | ICD-10-CM | POA: Diagnosis not present

## 2017-01-03 DIAGNOSIS — N183 Chronic kidney disease, stage 3 (moderate): Secondary | ICD-10-CM | POA: Diagnosis not present

## 2017-01-03 DIAGNOSIS — I129 Hypertensive chronic kidney disease with stage 1 through stage 4 chronic kidney disease, or unspecified chronic kidney disease: Secondary | ICD-10-CM | POA: Diagnosis not present

## 2017-01-03 DIAGNOSIS — G20 Parkinson's disease: Secondary | ICD-10-CM | POA: Diagnosis not present

## 2017-01-03 DIAGNOSIS — R1312 Dysphagia, oropharyngeal phase: Secondary | ICD-10-CM | POA: Diagnosis not present

## 2017-01-04 DIAGNOSIS — R1312 Dysphagia, oropharyngeal phase: Secondary | ICD-10-CM | POA: Diagnosis not present

## 2017-01-04 DIAGNOSIS — F0280 Dementia in other diseases classified elsewhere without behavioral disturbance: Secondary | ICD-10-CM | POA: Diagnosis not present

## 2017-01-04 DIAGNOSIS — G20 Parkinson's disease: Secondary | ICD-10-CM | POA: Diagnosis not present

## 2017-01-04 DIAGNOSIS — N183 Chronic kidney disease, stage 3 (moderate): Secondary | ICD-10-CM | POA: Diagnosis not present

## 2017-01-04 DIAGNOSIS — I129 Hypertensive chronic kidney disease with stage 1 through stage 4 chronic kidney disease, or unspecified chronic kidney disease: Secondary | ICD-10-CM | POA: Diagnosis not present

## 2017-01-04 DIAGNOSIS — G309 Alzheimer's disease, unspecified: Secondary | ICD-10-CM | POA: Diagnosis not present

## 2017-01-07 DIAGNOSIS — G20 Parkinson's disease: Secondary | ICD-10-CM | POA: Diagnosis not present

## 2017-01-07 DIAGNOSIS — R1312 Dysphagia, oropharyngeal phase: Secondary | ICD-10-CM | POA: Diagnosis not present

## 2017-01-07 DIAGNOSIS — F0280 Dementia in other diseases classified elsewhere without behavioral disturbance: Secondary | ICD-10-CM | POA: Diagnosis not present

## 2017-01-07 DIAGNOSIS — I129 Hypertensive chronic kidney disease with stage 1 through stage 4 chronic kidney disease, or unspecified chronic kidney disease: Secondary | ICD-10-CM | POA: Diagnosis not present

## 2017-01-07 DIAGNOSIS — G309 Alzheimer's disease, unspecified: Secondary | ICD-10-CM | POA: Diagnosis not present

## 2017-01-07 DIAGNOSIS — N183 Chronic kidney disease, stage 3 (moderate): Secondary | ICD-10-CM | POA: Diagnosis not present

## 2017-01-10 DIAGNOSIS — N390 Urinary tract infection, site not specified: Secondary | ICD-10-CM | POA: Diagnosis not present

## 2017-01-10 DIAGNOSIS — G309 Alzheimer's disease, unspecified: Secondary | ICD-10-CM | POA: Diagnosis not present

## 2017-01-10 DIAGNOSIS — N183 Chronic kidney disease, stage 3 (moderate): Secondary | ICD-10-CM | POA: Diagnosis not present

## 2017-01-10 DIAGNOSIS — G20 Parkinson's disease: Secondary | ICD-10-CM | POA: Diagnosis not present

## 2017-01-10 DIAGNOSIS — I129 Hypertensive chronic kidney disease with stage 1 through stage 4 chronic kidney disease, or unspecified chronic kidney disease: Secondary | ICD-10-CM | POA: Diagnosis not present

## 2017-01-10 DIAGNOSIS — R300 Dysuria: Secondary | ICD-10-CM | POA: Diagnosis not present

## 2017-01-10 DIAGNOSIS — F0280 Dementia in other diseases classified elsewhere without behavioral disturbance: Secondary | ICD-10-CM | POA: Diagnosis not present

## 2017-01-10 DIAGNOSIS — R1312 Dysphagia, oropharyngeal phase: Secondary | ICD-10-CM | POA: Diagnosis not present

## 2017-01-14 DIAGNOSIS — R262 Difficulty in walking, not elsewhere classified: Secondary | ICD-10-CM | POA: Diagnosis not present

## 2017-01-14 DIAGNOSIS — G309 Alzheimer's disease, unspecified: Secondary | ICD-10-CM | POA: Diagnosis not present

## 2017-01-14 DIAGNOSIS — R1312 Dysphagia, oropharyngeal phase: Secondary | ICD-10-CM | POA: Diagnosis not present

## 2017-01-14 DIAGNOSIS — M79673 Pain in unspecified foot: Secondary | ICD-10-CM | POA: Diagnosis not present

## 2017-01-14 DIAGNOSIS — N183 Chronic kidney disease, stage 3 (moderate): Secondary | ICD-10-CM | POA: Diagnosis not present

## 2017-01-14 DIAGNOSIS — G20 Parkinson's disease: Secondary | ICD-10-CM | POA: Diagnosis not present

## 2017-01-14 DIAGNOSIS — B351 Tinea unguium: Secondary | ICD-10-CM | POA: Diagnosis not present

## 2017-01-14 DIAGNOSIS — F0280 Dementia in other diseases classified elsewhere without behavioral disturbance: Secondary | ICD-10-CM | POA: Diagnosis not present

## 2017-01-14 DIAGNOSIS — I129 Hypertensive chronic kidney disease with stage 1 through stage 4 chronic kidney disease, or unspecified chronic kidney disease: Secondary | ICD-10-CM | POA: Diagnosis not present

## 2017-01-14 DIAGNOSIS — L603 Nail dystrophy: Secondary | ICD-10-CM | POA: Diagnosis not present

## 2017-01-15 NOTE — Progress Notes (Signed)
[name] was seen today in the movement disorders clinic for neurologic consultation at the request of Patient, No Pcp Per.   The patient is accompanied by her daughter and granddaughter who supplements the history.  The patient is seen today in neurologic consultation for possible Parkinson's disease.  I have reviewed an extensive number of records made available to me.  The patient was hospitalized on December 8 after a fall 2 days prior to admission.  The fall occurred after the patient got up at night while attempting to use the bathroom.  She did apparently taken a Valium earlier that night.  This was just one of many falls that she had sustained.  Her family had questioned whether or not she had had Parkinson's disease and a neurologic consultation was obtained while she was in the hospital.  The patient saw Dr. Leonel Ramsay and he felt that her symptoms were consistent with possible Parkinson's disease.  She was started on levodopa, 1 po tid and told to follow-up here.  Pt and family indicate that it has helped walking and tremor.  She is currently in skilled rehab for 30 days but is only in for short term and has only been there for 1 day.    The patient has previously seen [facility] neurology and last saw [facility] neurology on 03/15/2015.  She has seen Dr. [name] as well as several of his nurse practitioners.  I reviewed those records in detail.  She had a TIA on 01/03/2013 but there was no tremor when seen by [facility] neurology in July, 2014.  On her follow-up in January, 2015 notes indicate that "action tremor" had developed in the right hand and a resting tremor had developed in the right foot.  In February, 2016 notes indicate that the patient now had decreased arm swing and a mild resting tremor of the right hand and Mysoline was offered to the patient but she did not want that.  On 03/15/2015 the patient reported more falls and it was recommended that they just monitor the tremor.  No new  medications or therapy were given.  She reports fam hx of PD in maternal aunt.   09/19/15 update:  The patient presents today for follow-up, accompanied by her daughter who supplements the history.  I have reviewed prior records made available to me.  Last visit, her levodopa was increased to 1-1/2 tablets 3 times per day and it was recommended that she take these at 7 AM/11 AM/4 PM.  She did not do this and is still taking it tid and spreading out the last to take at bedtime.  States that she remembers when she was in the SNF she knew she was taking an extra 1/2 tablet of something but didn't know what.  She denies any falls since our last visit.  No hallucinations.  No lightheadedness or near syncope.  She did have a modified barium swallow on 08/05/2015.  This was normal.  It was noted that her respiratory status may allow for episodic aspiration due to discoordination of swallow/apneic period.  I did note from SNF records that the patient was on valium 2 mg prn for tremor.   Is still taking it at home but isn't sure how often but thinks one time per day. 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  She is accompanied by her daughter and  granddaughter who supplement the history.  Last visit, her levodopa was increased to 1-1/2 tablets 3 times per day and it was recommended that she take these at 7 AM/11 AM/4 PM. She actually takes these at 7:30/noon/and 6pm (1 hour after supper).   I talked extensively to her last visit about the fact that I did not like her living situation, which was living with her husband who had Alzheimer's disease, given the fact that the patient's own memory was impaired.  I have reviewed  records made available to me since last visit.  She was hospitalized from March 20 to March 23 with a urinary tract infection and discharged to a subacute nursing facility.  Reports that she  is in still in assisted living and "that is my home now."  They help her at night with getting up and they cook the food.  She is able to get herself dressed.  She gets assistance with bathing.  She gets some PT a few times a week and otherwise is in the [facility] of the time.  She had a bedside swallow evaluation on March 21 recommended mechanical soft diet with thin liquids.  No falls.  No hallucinations.    05/24/16 update:  The patient presents today for follow-up, accompanied by her daughter who supplements the history.  She is on carbidopa/levodopa 25/100,  1-1/2 tablets 3 times a day.  She states that her biggest issue is tremor of the right foot.  It is better if she is given her medication on time, which she states is a challenge because she is given that that shift change in the morning.  She asks me to write her a prescription for marijuana.  She denies falls.  She denies lightheadedness or near syncope.  She continues to live at the assisted living facility.  She did follow up with vascular surgery and I reviewed those records.  She had a carotid ultrasound that did not demonstrate any significant stenosis.  This was done on 04/24/2016.  09/24/16 update:  Patient presents today for follow-up.  She is accompanied by her daughter who supplements the history.  Last visit, I increased her levodopa to 2 tablets 3 times per day.  She states that she doesn't know what she is taking and neither does her daughter.   She moved from [facility] to [facility] and really doesn't like it.  She moved because her husband needed memory care and she wanted to move with her husband.  Pt is in assisted living.  No med list accompanies her.   Pt has had 5 falls and all falls have occurred since she moved.  Pt denies lightheadedness, near syncope.  No hallucinations.  Mood has not been good as patient has been so anxious and upset re: living situation.  Had anxiety attack last night.  01/22/17 update:  Patient in today in  follow-up, accompanied by her daughter who supplements the history.  I have reviewed hospital records since last visit.  Admitted to the hospital in early March with UTI with associated encephalopathy.  They discussed with her, as had I in the past, the issue of her being on both Valium and Ativan.  Returned to the hospital the day she was released due to a fall in which she sustained a laceration above the eyebrow that ultimately had to be repaired.  CT of the brain was negative.  She came back on 10/14/2016 with another fall off of the toilet.  She again struck her head.  CT of the head was again performed and again was nonacute.  Unknown to the hospital on 11/04/2016 with acute encephalopathy.  This was felt secondary to urinary tract infection.  Depakote was discontinued as was Valium. She returns today on both Depakote and valium and while valium listed as prn it states "Per NP, may give at 8am, 2pm, 8pm."  Modified barium swallow was done on 11/07/2016 which demonstrated moderate oral and pharyngeal phase dysphagia..  Pured solids with honey thick liquids was recommended.  She lives at [facility] assisted living now but has additional caregivers that rotate shifts with her.  She is noting more tremor.  Only on one tablet carbidopa/levodopa 25/100 tid.  Used to be on 2 po tid.  Not sure when that changed.     ALLERGIES:   Allergies  Allergen Reactions  . Relafen [Nabumetone] Diarrhea and Other (See Comments)    GI / Urinary Bleeding  . Lipitor [Atorvastatin] Other (See Comments)    Causes memory loss  . Other Other (See Comments)    "orvail" unknown:  Causes Bloody stool  . Penicillins Hives, Itching, Swelling and Rash    Has patient had a PCN reaction causing immediate rash, facial/tongue/throat swelling, SOB or lightheadedness with hypotension: no Has patient had a PCN reaction causing severe rash involving mucus membranes or skin necrosis: unknown Has patient had a PCN reaction that required  hospitalization no Has patient had a PCN reaction occurring within the last 10 years: no If all of the above answers are "NO", then may proceed with Cephalosporin use.     CURRENT MEDICATIONS:  Outpatient Encounter Prescriptions as of 01/22/2017  Medication Sig  . ADVAIR HFA 230-21 MCG/ACT inhaler Inhale 2 puffs into the lungs 2 (two) times daily.  Marland Kitchen amLODipine (NORVASC) 5 MG tablet Take 1 tablet (5 mg total) by mouth daily. Must keep appt for future refills (Patient taking differently: Take 5 mg by mouth daily. )  . aspirin 81 MG chewable tablet Chew 81 mg by mouth daily.  . carbidopa-levodopa (SINEMET IR) 25-100 MG tablet Take 1 tablet by mouth 3 (three) times daily. for parkinson's disease  . escitalopram (LEXAPRO) 5 MG tablet Take 1 tablet (5 mg total) by mouth daily.  . fluticasone (FLONASE) 50 MCG/ACT nasal spray Place 1 spray into both nostrils daily.   . food thickener (THICK IT) POWD Take 1 Container by mouth as needed (to thicken liquids.).  Marland Kitchen levothyroxine (SYNTHROID) 88 MCG tablet Take 88 mcg by mouth daily at 6 (six) AM.   . polyethylene glycol (MIRALAX / GLYCOLAX) packet Take 17 g by mouth daily. Mix in 8 oz liquid and drink  . senna-docusate (SENNA S) 8.6-50 MG tablet Take 1 tablet by mouth daily as needed (constipation).  . sucralfate (CARAFATE) 1 g tablet Take 1 g by mouth 4 (four) times daily.    No facility-administered encounter medications on file as of 01/22/2017.     PAST MEDICAL HISTORY:   Past Medical History:  Diagnosis Date  . Acute cystitis   . Anemia   . Anxiety   . Aortic stenosis, mild 11/18/2013  . Asthma   . Back pain   . Bronchitis, acute   . Carotid artery occlusion   . Cerebrovascular disease   . COPD (chronic obstructive pulmonary disease) ([place])   . Depression   . Diverticulosis of colon   . DJD (degenerative joint disease)   . Fall 07/05/2015  . GERD (gastroesophageal reflux disease)   . History of sudden  visual loss   . Hyperlipidemia     . Hypertension   . Hypothyroidism   . Pneumonia 2015   hx of x 2   . Pulmonary hypertension, moderate to severe ([place]) 11/19/2014  . Shortness of breath    with exertion   . 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 Transient ischemic attack   . UTI (lower urinary tract infection)    hx of     PAST SURGICAL HISTORY:   Past Surgical History:  Procedure Laterality Date  . CARDIAC CATHETERIZATION  02/18/14  . CARDIOTHORACIC PROCEDURE  01/13/2009   Right   . COLECTOMY    . ENDARTERECTOMY     right carotid endartarectomy - 2010  . FOOT SURGERY     nerve cut between toes   . LEFT AND RIGHT HEART CATHETERIZATION WITH CORONARY ANGIOGRAM N/A 02/18/2014   Procedure: LEFT AND RIGHT HEART CATHETERIZATION WITH CORONARY ANGIOGRAM;  Surgeon: Larey Dresser, MD;  Location: [facility] CATH LAB;  Service: Cardiovascular;  Laterality: N/A;  . REPLACEMENT TOTAL KNEE     right and left knee  . TEE WITHOUT CARDIOVERSION N/A 02/08/2014   Procedure: TRANSESOPHAGEAL ECHOCARDIOGRAM (TEE);  Surgeon: Larey Dresser, MD;  Location: [facility];  Service: Cardiovascular;  Laterality: N/A;  . TOTAL KNEE ARTHROPLASTY Left 11/17/2012   Procedure: TOTAL KNEE ARTHROPLASTY;  Surgeon: Gearlean Alf, MD;  Location: [facility];  Service: Orthopedics;  Laterality: Left;  . TUBAL LIGATION    . VAGINAL HYSTERECTOMY      SOCIAL HISTORY:   Social History   Social History  . Marital status: Married    Spouse name: Journalist, newspaper  . Number of children: 3  . Years of education: N/A   Occupational History  . Not on file.   Social History Main Topics  . Smoking status: Never Smoker  . Smokeless tobacco: Never Used  . Alcohol use No  . Drug use: No  . Sexual activity: Not on file   Other Topics Concern  . Not on file   Social History Narrative  . No narrative on file    FAMILY HISTORY:   Family Status  Relation Status  . Father Deceased       Emphysema  . Mother Deceased       CHF  . Sister Alive        heart disease  . Brother Alive       COPD  . Sister Alive       COPD  . Sister (Not Specified)  . Brother (Not Specified)    ROS:  A complete 10 system review of systems was obtained and was unremarkable apart from what is mentioned above.  PHYSICAL EXAMINATION:    VITALS:   Vitals:   01/22/17 1415  BP: 112/64  Pulse: 80  SpO2: 96%  Weight: 127 lb (57.6 kg)  Height: 5\' 5"  (1.651 m)    GEN:  The patient appears stated age and is in NAD.  There is an unpleasant odor in the room. HEENT:  Normocephalic, atraumatic.  The mucous membranes are moist. The superficial temporal arteries are without ropiness or tenderness. CV:  RRR Lungs:  CTAB.  She is visibly short of breath (more anxiety than true SOB).   Neck/HEME:  There are no carotid bruits bilaterally.  Neurological examination:  Orientation:  Montreal Cognitive Assessment  02/16/2016  Visuospatial/ Executive (0/5) 3  Naming (0/3) 3  Attention: Read list of digits (0/2)  2  Attention: Read list of letters (0/1) 1  Attention: Serial 7 subtraction starting at 100 (0/3) 1  Language: Repeat phrase (0/2) 2  Language : Fluency (0/1) 1  Abstraction (0/2) 2  Delayed Recall (0/5) 2  Orientation (0/6) 6  Total 23  Adjusted Score (based on education) 24    Cranial nerves: There is good facial symmetry.  There is significant facial hypomimia.  The speech is fluent and clear.  She is hypophonic.  Soft palate rises symmetrically and there is no tongue deviation. Hearing is intact to conversational tone. Sensation: Sensation is intact to light touch throughout. Motor: Strength is 5/5 in the bilateral upper and lower extremities.   Shoulder shrug is equal and symmetric.  There is no pronator drift.   Movement examination: Tone: There is no rigidity today.   Abnormal movements: There is a mild right upper extremity resting tremor and RLE resting tremor Coordination:  There is no significant decremation today. Gait and Station: no  longer ambulates so opted not to try today  ASSESSMENT/PLAN:  1.  Idiopathic Parkinson's disease.  While this is just diagnosed in 06/2015, she has had documented sx's since at least 07/2013.    -pt used to be on carbidopa/levodopa 25/100, 2 po tid but was decreased at some point.  Will re-increase slowly to 2/1/2.  Having more tremor and slightly more rigidity.  Watch for hallucinations and confusion.   2.  Dysphagia  -Modified barium swallow was done on 11/07/2016 which demonstrated moderate oral and pharyngeal phase dysphagia..  Pured solids with honey thick liquids was recommended.  She is following this diet at [facility].   3  Anxiety  -no longer on both ativan and valium but on valium tid prn.  Not sure that this is best idea and last visit I wrote for psychiatry consult but not sure that this was accomplished.  Discussed again that tid valium is going to increase risk for falls/encephalopathy which she has had in the past. 4.  Follow up is anticipated in the next 4 months, sooner should new neurologic issues arise.   Much greater than 50% of this visit was spent in counseling with the patient and the family.  Total face to face time:  30 min

## 2017-01-16 DIAGNOSIS — G20 Parkinson's disease: Secondary | ICD-10-CM | POA: Diagnosis not present

## 2017-01-16 DIAGNOSIS — R1312 Dysphagia, oropharyngeal phase: Secondary | ICD-10-CM | POA: Diagnosis not present

## 2017-01-16 DIAGNOSIS — F0280 Dementia in other diseases classified elsewhere without behavioral disturbance: Secondary | ICD-10-CM | POA: Diagnosis not present

## 2017-01-16 DIAGNOSIS — G309 Alzheimer's disease, unspecified: Secondary | ICD-10-CM | POA: Diagnosis not present

## 2017-01-16 DIAGNOSIS — N183 Chronic kidney disease, stage 3 (moderate): Secondary | ICD-10-CM | POA: Diagnosis not present

## 2017-01-16 DIAGNOSIS — I129 Hypertensive chronic kidney disease with stage 1 through stage 4 chronic kidney disease, or unspecified chronic kidney disease: Secondary | ICD-10-CM | POA: Diagnosis not present

## 2017-01-22 ENCOUNTER — Encounter: Payer: Self-pay | Admitting: Neurology

## 2017-01-22 ENCOUNTER — Ambulatory Visit (INDEPENDENT_AMBULATORY_CARE_PROVIDER_SITE_OTHER): Payer: Medicare Other | Admitting: Neurology

## 2017-01-22 VITALS — BP 112/64 | HR 80 | Ht 65.0 in | Wt 127.0 lb

## 2017-01-22 DIAGNOSIS — G20 Parkinson's disease: Secondary | ICD-10-CM | POA: Diagnosis not present

## 2017-01-22 DIAGNOSIS — F0280 Dementia in other diseases classified elsewhere without behavioral disturbance: Secondary | ICD-10-CM | POA: Diagnosis not present

## 2017-01-22 DIAGNOSIS — F411 Generalized anxiety disorder: Secondary | ICD-10-CM | POA: Diagnosis not present

## 2017-01-22 DIAGNOSIS — G309 Alzheimer's disease, unspecified: Secondary | ICD-10-CM | POA: Diagnosis not present

## 2017-01-22 DIAGNOSIS — R1312 Dysphagia, oropharyngeal phase: Secondary | ICD-10-CM | POA: Diagnosis not present

## 2017-01-22 DIAGNOSIS — N183 Chronic kidney disease, stage 3 (moderate): Secondary | ICD-10-CM | POA: Diagnosis not present

## 2017-01-22 DIAGNOSIS — R1319 Other dysphagia: Secondary | ICD-10-CM

## 2017-01-22 DIAGNOSIS — I129 Hypertensive chronic kidney disease with stage 1 through stage 4 chronic kidney disease, or unspecified chronic kidney disease: Secondary | ICD-10-CM | POA: Diagnosis not present

## 2017-01-23 DIAGNOSIS — F0280 Dementia in other diseases classified elsewhere without behavioral disturbance: Secondary | ICD-10-CM | POA: Diagnosis not present

## 2017-01-23 DIAGNOSIS — N183 Chronic kidney disease, stage 3 (moderate): Secondary | ICD-10-CM | POA: Diagnosis not present

## 2017-01-23 DIAGNOSIS — R1312 Dysphagia, oropharyngeal phase: Secondary | ICD-10-CM | POA: Diagnosis not present

## 2017-01-23 DIAGNOSIS — G20 Parkinson's disease: Secondary | ICD-10-CM | POA: Diagnosis not present

## 2017-01-23 DIAGNOSIS — G309 Alzheimer's disease, unspecified: Secondary | ICD-10-CM | POA: Diagnosis not present

## 2017-01-23 DIAGNOSIS — I129 Hypertensive chronic kidney disease with stage 1 through stage 4 chronic kidney disease, or unspecified chronic kidney disease: Secondary | ICD-10-CM | POA: Diagnosis not present

## 2017-01-24 ENCOUNTER — Other Ambulatory Visit (HOSPITAL_COMMUNITY): Payer: Self-pay | Admitting: Family Medicine

## 2017-01-24 DIAGNOSIS — F0280 Dementia in other diseases classified elsewhere without behavioral disturbance: Secondary | ICD-10-CM | POA: Diagnosis not present

## 2017-01-24 DIAGNOSIS — R1319 Other dysphagia: Secondary | ICD-10-CM

## 2017-01-24 DIAGNOSIS — G309 Alzheimer's disease, unspecified: Secondary | ICD-10-CM | POA: Diagnosis not present

## 2017-01-24 DIAGNOSIS — R1312 Dysphagia, oropharyngeal phase: Secondary | ICD-10-CM | POA: Diagnosis not present

## 2017-01-24 DIAGNOSIS — I129 Hypertensive chronic kidney disease with stage 1 through stage 4 chronic kidney disease, or unspecified chronic kidney disease: Secondary | ICD-10-CM | POA: Diagnosis not present

## 2017-01-24 DIAGNOSIS — G20 Parkinson's disease: Secondary | ICD-10-CM | POA: Diagnosis not present

## 2017-01-24 DIAGNOSIS — N183 Chronic kidney disease, stage 3 (moderate): Secondary | ICD-10-CM | POA: Diagnosis not present

## 2017-01-25 DIAGNOSIS — I129 Hypertensive chronic kidney disease with stage 1 through stage 4 chronic kidney disease, or unspecified chronic kidney disease: Secondary | ICD-10-CM | POA: Diagnosis not present

## 2017-01-25 DIAGNOSIS — N183 Chronic kidney disease, stage 3 (moderate): Secondary | ICD-10-CM | POA: Diagnosis not present

## 2017-01-25 DIAGNOSIS — F0280 Dementia in other diseases classified elsewhere without behavioral disturbance: Secondary | ICD-10-CM | POA: Diagnosis not present

## 2017-01-25 DIAGNOSIS — G20 Parkinson's disease: Secondary | ICD-10-CM | POA: Diagnosis not present

## 2017-01-25 DIAGNOSIS — R1312 Dysphagia, oropharyngeal phase: Secondary | ICD-10-CM | POA: Diagnosis not present

## 2017-01-25 DIAGNOSIS — G309 Alzheimer's disease, unspecified: Secondary | ICD-10-CM | POA: Diagnosis not present

## 2017-01-28 DIAGNOSIS — F0280 Dementia in other diseases classified elsewhere without behavioral disturbance: Secondary | ICD-10-CM | POA: Diagnosis not present

## 2017-01-28 DIAGNOSIS — I129 Hypertensive chronic kidney disease with stage 1 through stage 4 chronic kidney disease, or unspecified chronic kidney disease: Secondary | ICD-10-CM | POA: Diagnosis not present

## 2017-01-28 DIAGNOSIS — G309 Alzheimer's disease, unspecified: Secondary | ICD-10-CM | POA: Diagnosis not present

## 2017-01-28 DIAGNOSIS — G20 Parkinson's disease: Secondary | ICD-10-CM | POA: Diagnosis not present

## 2017-01-28 DIAGNOSIS — N183 Chronic kidney disease, stage 3 (moderate): Secondary | ICD-10-CM | POA: Diagnosis not present

## 2017-01-28 DIAGNOSIS — R1312 Dysphagia, oropharyngeal phase: Secondary | ICD-10-CM | POA: Diagnosis not present

## 2017-01-31 DIAGNOSIS — G20 Parkinson's disease: Secondary | ICD-10-CM | POA: Diagnosis not present

## 2017-01-31 DIAGNOSIS — R1312 Dysphagia, oropharyngeal phase: Secondary | ICD-10-CM | POA: Diagnosis not present

## 2017-01-31 DIAGNOSIS — I129 Hypertensive chronic kidney disease with stage 1 through stage 4 chronic kidney disease, or unspecified chronic kidney disease: Secondary | ICD-10-CM | POA: Diagnosis not present

## 2017-01-31 DIAGNOSIS — N183 Chronic kidney disease, stage 3 (moderate): Secondary | ICD-10-CM | POA: Diagnosis not present

## 2017-01-31 DIAGNOSIS — F0280 Dementia in other diseases classified elsewhere without behavioral disturbance: Secondary | ICD-10-CM | POA: Diagnosis not present

## 2017-01-31 DIAGNOSIS — G309 Alzheimer's disease, unspecified: Secondary | ICD-10-CM | POA: Diagnosis not present

## 2017-02-01 ENCOUNTER — Encounter (HOSPITAL_COMMUNITY): Payer: Self-pay | Admitting: Emergency Medicine

## 2017-02-01 ENCOUNTER — Emergency Department (HOSPITAL_COMMUNITY)
Admission: EM | Admit: 2017-02-01 | Discharge: 2017-02-01 | Disposition: A | Discharge status: Skilled Nursing Facility | Payer: Medicare Other | Attending: Emergency Medicine | Admitting: Emergency Medicine

## 2017-02-01 DIAGNOSIS — F0280 Dementia in other diseases classified elsewhere without behavioral disturbance: Secondary | ICD-10-CM | POA: Diagnosis not present

## 2017-02-01 DIAGNOSIS — D649 Anemia, unspecified: Secondary | ICD-10-CM | POA: Diagnosis not present

## 2017-02-01 DIAGNOSIS — J45909 Unspecified asthma, uncomplicated: Secondary | ICD-10-CM | POA: Insufficient documentation

## 2017-02-01 DIAGNOSIS — N390 Urinary tract infection, site not specified: Secondary | ICD-10-CM | POA: Diagnosis not present

## 2017-02-01 DIAGNOSIS — H9201 Otalgia, right ear: Secondary | ICD-10-CM | POA: Diagnosis present

## 2017-02-01 DIAGNOSIS — Z79899 Other long term (current) drug therapy: Secondary | ICD-10-CM | POA: Insufficient documentation

## 2017-02-01 DIAGNOSIS — Z8673 Personal history of transient ischemic attack (TIA), and cerebral infarction without residual deficits: Secondary | ICD-10-CM | POA: Diagnosis not present

## 2017-02-01 DIAGNOSIS — N3000 Acute cystitis without hematuria: Secondary | ICD-10-CM | POA: Diagnosis not present

## 2017-02-01 DIAGNOSIS — I129 Hypertensive chronic kidney disease with stage 1 through stage 4 chronic kidney disease, or unspecified chronic kidney disease: Secondary | ICD-10-CM | POA: Insufficient documentation

## 2017-02-01 DIAGNOSIS — J449 Chronic obstructive pulmonary disease, unspecified: Secondary | ICD-10-CM | POA: Diagnosis not present

## 2017-02-01 DIAGNOSIS — G20 Parkinson's disease: Secondary | ICD-10-CM | POA: Insufficient documentation

## 2017-02-01 DIAGNOSIS — F419 Anxiety disorder, unspecified: Secondary | ICD-10-CM | POA: Diagnosis not present

## 2017-02-01 DIAGNOSIS — Z7982 Long term (current) use of aspirin: Secondary | ICD-10-CM | POA: Diagnosis not present

## 2017-02-01 DIAGNOSIS — E039 Hypothyroidism, unspecified: Secondary | ICD-10-CM | POA: Insufficient documentation

## 2017-02-01 DIAGNOSIS — H9209 Otalgia, unspecified ear: Secondary | ICD-10-CM | POA: Diagnosis not present

## 2017-02-01 DIAGNOSIS — E46 Unspecified protein-calorie malnutrition: Secondary | ICD-10-CM | POA: Diagnosis not present

## 2017-02-01 DIAGNOSIS — I10 Essential (primary) hypertension: Secondary | ICD-10-CM | POA: Diagnosis not present

## 2017-02-01 DIAGNOSIS — Z96651 Presence of right artificial knee joint: Secondary | ICD-10-CM | POA: Diagnosis not present

## 2017-02-01 DIAGNOSIS — N183 Chronic kidney disease, stage 3 (moderate): Secondary | ICD-10-CM | POA: Insufficient documentation

## 2017-02-01 DIAGNOSIS — G309 Alzheimer's disease, unspecified: Secondary | ICD-10-CM | POA: Diagnosis not present

## 2017-02-01 DIAGNOSIS — R102 Pelvic and perineal pain: Secondary | ICD-10-CM | POA: Diagnosis not present

## 2017-02-01 DIAGNOSIS — H60391 Other infective otitis externa, right ear: Secondary | ICD-10-CM | POA: Diagnosis not present

## 2017-02-01 DIAGNOSIS — G9341 Metabolic encephalopathy: Secondary | ICD-10-CM | POA: Diagnosis not present

## 2017-02-01 DIAGNOSIS — R031 Nonspecific low blood-pressure reading: Secondary | ICD-10-CM | POA: Diagnosis not present

## 2017-02-01 HISTORY — DX: Parkinson's disease: G20

## 2017-02-01 HISTORY — DX: Unspecified dementia, unspecified severity, without behavioral disturbance, psychotic disturbance, mood disturbance, and anxiety: F03.90

## 2017-02-01 HISTORY — DX: Parkinson's disease without dyskinesia, without mention of fluctuations: G20.A1

## 2017-02-01 LAB — URINALYSIS, ROUTINE W REFLEX MICROSCOPIC
Bilirubin Urine: NEGATIVE
GLUCOSE, UA: NEGATIVE mg/dL
Hgb urine dipstick: NEGATIVE
Ketones, ur: 5 mg/dL — AB
Nitrite: POSITIVE — AB
PH: 7 (ref 5.0–8.0)
Protein, ur: NEGATIVE mg/dL
SPECIFIC GRAVITY, URINE: 1.011 (ref 1.005–1.030)

## 2017-02-01 MED ORDER — CIPROFLOXACIN HCL 500 MG PO TABS
500.0000 mg | ORAL_TABLET | Freq: Once | ORAL | Status: AC
  Administered 2017-02-01: 500 mg via ORAL
  Filled 2017-02-01: qty 1

## 2017-02-01 MED ORDER — HYDROCODONE-ACETAMINOPHEN 5-325 MG PO TABS
1.0000 | ORAL_TABLET | Freq: Once | ORAL | Status: AC
  Administered 2017-02-01: 1 via ORAL
  Filled 2017-02-01: qty 1

## 2017-02-01 MED ORDER — CIPROFLOXACIN HCL 500 MG PO TABS: 500.0000 mg | ORAL_TABLET | Freq: Two times a day (BID) | ORAL | 0 refills | Status: DC

## 2017-02-01 MED ORDER — HYDROCODONE-ACETAMINOPHEN 5-325 MG PO TABS: 1.0000 | ORAL_TABLET | Freq: Four times a day (QID) | ORAL | 0 refills | Status: DC | PRN

## 2017-02-01 MED ORDER — NEOMYCIN-COLIST-HC-THONZONIUM 3.3-3-10-0.5 MG/ML OT SUSP
3.0000 [drp] | Freq: Once | OTIC | Status: AC
  Administered 2017-02-01: 3 [drp] via OTIC
  Filled 2017-02-01: qty 10

## 2017-02-01 MED ORDER — NEOMYCIN-POLYMYXIN-HC 3.5-10000-1 OT SUSP: 4.0000 [drp] | Freq: Four times a day (QID) | OTIC | 0 refills | Status: DC

## 2017-02-01 NOTE — ED Notes (Signed)
Report called to [facility]

## 2017-02-01 NOTE — Discharge Instructions (Signed)
Cortisporin drops as prescribed.  Cipro as prescribed.  Return to the emergency department for increasing pain, high fevers, or other new and concerning symptoms.

## 2017-02-01 NOTE — ED Triage Notes (Signed)
Pt brought in by EMS with c/o right ear pain that started yesterday and has progressively gotten worse  Pt states she has taken 4 tylenol without relief  Pt denies nausea or fever  Pt has some old bruising on her forehead  Pt is also c/o right shoulder pain

## 2017-02-01 NOTE — ED Provider Notes (Signed)
[facility] DEPT Provider Note   CSN: [id number] Arrival date & time: 02/01/17  0145 By signing my name below, I, [name], attest that this documentation has been prepared under the direction and in the presence of Angel Speak, MD . Electronically Signed: Dyke [name], Scribe. 02/01/2017. 2:13 AM.   History   Chief Complaint Chief Complaint  Patient presents with  . Otalgia   HPI [name] is a 82 y.o. female who presents to the Emergency Department complaining of sudden onset, progressively worsening right ear pain onset yesterday. Per pt, her left ear has also become painful tonight, but her right ear pain is significantly more severe. She reports a "crawling" sensation to her right ear. She has taken 4 Tylenol with no relief of pain. No alleviating or modifying factors noted. She has not been swimming recently. Pt denies any drainage, fevers, nausea, or vomiting.   Pt  also reports moderate dysuria onset two days ago. She denies abdominal pain or any other urinary symptoms.   The history is provided by the patient. No language interpreter was used.    Past Medical History:  Diagnosis Date  . Acute cystitis   . Anemia   . Anxiety   . Aortic stenosis, mild 11/18/2013  . Asthma   . Back pain   . Bronchitis, acute   . Carotid artery occlusion   . Cerebrovascular disease   . COPD (chronic obstructive pulmonary disease) ([place])   . Dementia   . Depression   . Diverticulosis of colon   . DJD (degenerative joint disease)   . Fall 07/05/2015  . GERD (gastroesophageal reflux disease)   . History of sudden visual loss   . Hyperlipidemia   . Hypertension   . Hypothyroidism   . Parkinson's disease ([place])   . Pneumonia 2015   hx of x 2   . Pulmonary hypertension, moderate to severe ([place]) 11/19/2014  . Shortness of breath    with exertion   . 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 Transient ischemic attack   . UTI (lower urinary tract infection)    hx of      Patient Active Problem List   Diagnosis Date Noted  . Protein-calorie malnutrition, severe 11/05/2016  . COPD (chronic obstructive pulmonary disease) ([place]) 11/04/2016  . Stroke (cerebrum) ([place]) 11/04/2016  . CKD (chronic kidney disease), stage III 11/04/2016  . Acute encephalopathy 10/04/2016  . UTI (lower urinary tract infection) 10/17/2015  . Dementia in Alzheimer's disease 10/17/2015  . Encephalopathy, metabolic 03/47/4259  . Contusion, hip 07/07/2015  . Falls frequently 07/07/2015  . Cervical radiculopathy 07/07/2015  . Anxiety and depression 07/07/2015  . Gait disturbance 07/07/2015  . Hyperglycemia 07/07/2015  . FTT (failure to thrive) in adult 07/07/2015  . Parkinson disease ([place])   . UTI (urinary tract infection) 07/05/2015  . Chronic neck pain 07/05/2015  . Debility 07/05/2015  . Fatigue 01/19/2015  . Dysuria 01/19/2015  . Chronic restrictive lung disease 01/04/2015  . DOE (dyspnea on exertion) 11/19/2014  . Pulmonary hypertension, moderate to severe ([place]) 11/19/2014  . Irregular heart beats 08/12/2014  . Aftercare following surgery of the circulatory system, [place] 04/19/2014  . Pain of left lower extremity-Knee 04/19/2014  . Weakness 02/11/2014  . Loss of weight 02/11/2014  . Valvular heart disease 01/19/2014  . Pulmonary hypertension ([place]) 01/19/2014  . CAP (community acquired pneumonia) 01/08/2014  . Asthma with acute exacerbation 01/06/2014  . Fall 01/06/2014  . Aortic stenosis,  mild 11/18/2013  . Preventative health care 11/18/2013  . Anemia, unspecified 11/18/2013  . Tremor 09/21/2013  . Dyspnea 05/18/2013  . Physical deconditioning 05/18/2013  . OA (osteoarthritis) of knee 11/17/2012  . Hyperlipidemia 11/04/2012  . Occlusion and stenosis of carotid artery without mention of cerebral infarction 03/06/2012  . ASTHMA 10/09/2010  . Sudden visual loss 01/10/2009  . TRANSIENT ISCHEMIC ATTACK 01/10/2009  . CEREBROVASCULAR DISEASE 11/19/2007  . BRONCHITIS,  RECURRENT 11/19/2007  . DIVERTICULOSIS OF COLON 11/19/2007  . Hypothyroidism 08/15/2007  . Anxiety state 08/15/2007  . Depression 08/15/2007  . Essential hypertension 08/15/2007  . GERD 08/15/2007  . Osteoarthritis 08/15/2007  . BACK PAIN, LUMBAR 08/15/2007    Past Surgical History:  Procedure Laterality Date  . CARDIAC CATHETERIZATION  02/18/14  . CARDIOTHORACIC PROCEDURE  01/13/2009   Right   . COLECTOMY    . ENDARTERECTOMY     right carotid endartarectomy - 2010  . FOOT SURGERY     nerve cut between toes   . LEFT AND RIGHT HEART CATHETERIZATION WITH CORONARY ANGIOGRAM N/A 02/18/2014   Procedure: LEFT AND RIGHT HEART CATHETERIZATION WITH CORONARY ANGIOGRAM;  Surgeon: Larey Dresser, MD;  Location: [facility] CATH LAB;  Service: Cardiovascular;  Laterality: N/A;  . REPLACEMENT TOTAL KNEE     right and left knee  . TEE WITHOUT CARDIOVERSION N/A 02/08/2014   Procedure: TRANSESOPHAGEAL ECHOCARDIOGRAM (TEE);  Surgeon: Larey Dresser, MD;  Location: [facility];  Service: Cardiovascular;  Laterality: N/A;  . TOTAL KNEE ARTHROPLASTY Left 11/17/2012   Procedure: TOTAL KNEE ARTHROPLASTY;  Surgeon: Gearlean Alf, MD;  Location: [facility] ORS;  Service: Orthopedics;  Laterality: Left;  . TUBAL LIGATION    . VAGINAL HYSTERECTOMY      OB History    No data available       Home Medications    Prior to Admission medications   Medication Sig Start Date End Date Taking? Authorizing Provider  ADVAIR HFA 230-21 MCG/ACT inhaler Inhale 2 puffs into the lungs 2 (two) times daily. 11/23/14   Biagio Borg, MD  aspirin 81 MG chewable tablet Chew 81 mg by mouth daily.    [provider]  carbidopa-levodopa (SINEMET IR) 25-100 MG tablet Take 1 tablet by mouth 3 (three) times daily. for parkinson's disease    [provider]  DIAZEPAM PO Take 2 mg by mouth 3 (three) times daily.    [provider]  divalproex (DEPAKOTE) 125 MG DR tablet Take 250 mg by mouth 3 (three) times daily.     [provider]  escitalopram (LEXAPRO) 10 MG tablet Take 10 mg by mouth daily.    [provider]  levothyroxine (SYNTHROID) 88 MCG tablet Take 88 mcg by mouth daily at 6 (six) AM.     [provider]  omeprazole (PRILOSEC) 20 MG capsule Take 20 mg by mouth daily.    [provider]  polyethylene glycol (MIRALAX / GLYCOLAX) packet Take 17 g by mouth daily. Mix in 8 oz liquid and drink    [provider]  senna-docusate (SENNA S) 8.6-50 MG tablet Take 1 tablet by mouth daily as needed (constipation).    [provider]    Family History Family History  Problem Relation Age of Onset  . Emphysema Father   . Heart disease Mother   . Hyperlipidemia Mother   . Hypertension Mother   . Coronary artery disease Sister   . Heart disease Sister   . Hypertension Sister   .  Varicose Veins Sister   . AAA (abdominal aortic aneurysm) Sister   . Rheumatic fever Brother        x2  . Heart attack Brother     Social History Social History  Substance Use Topics  . Smoking status: Never Smoker  . Smokeless tobacco: Never Used  . Alcohol use No    Allergies   Relafen [nabumetone]; Lipitor [atorvastatin]; Other; and Penicillins  Review of Systems Review of Systems All systems reviewed and are negative for acute change except as noted in the HPI.  Physical Exam Updated Vital Signs BP (!) 116/45 (BP Location: Left Arm)   Pulse 65   Temp 98 F (36.7 C) (Oral)   Resp 18   SpO2 94%   Physical Exam  Constitutional: She is oriented to person, place, and time. She appears well-developed and well-nourished. No distress.  HENT:  Head: Normocephalic and atraumatic.  The right ear canal appears erythematous and inflamed. Pain with speculum insertion and manipulation of the pinna. TM appears normal. No foreign body noted.   Eyes: EOM are normal.  Neck: Normal range of motion.  Cardiovascular: Normal rate, regular rhythm and normal heart sounds.    Pulmonary/Chest: Effort normal and breath sounds normal.  Abdominal: Soft. She exhibits no distension. There is no tenderness.  Musculoskeletal: Normal range of motion.  Neurological: She is alert and oriented to person, place, and time.  Skin: Skin is warm and dry.  Psychiatric: She has a normal mood and affect. Judgment normal.  Nursing note and vitals reviewed.  ED Treatments / Results  DIAGNOSTIC STUDIES:  Oxygen Saturation is 94% on RA, low by my interpretation.    COORDINATION OF CARE:  2:13 AM Discussed treatment plan with pt at bedside and pt agreed to plan.   Labs (all labs ordered are listed, but only abnormal results are displayed) Labs Reviewed - No data to display  EKG  EKG Interpretation None       Radiology No results found.  Procedures Procedures (including critical care time)  Medications Ordered in ED Medications - No data to display   Initial Impression / Assessment and Plan / ED Course  I have reviewed the triage vital signs and the nursing notes.  Pertinent labs & imaging results that were available during my care of the patient were reviewed by me and considered in my medical decision making (see chart for details).  Patient presents with ear pain that appears related to an otitis externa. This will be treated with Cortisporin drops. She is also complaining of burning with urination. Her urinalysis is consistent with a urinary tract infection. This will be treated with Cipro. She will be discharged, to return as needed for any problems.  Final Clinical Impressions(s) / ED Diagnoses   Final diagnoses:  None    New Prescriptions New Prescriptions   No medications on file   I personally performed the services described in this documentation, which was scribed in my presence. The recorded information has been reviewed and is accurate.        Angel Speak, MD 02/01/17 [phone]

## 2017-02-04 DIAGNOSIS — F0280 Dementia in other diseases classified elsewhere without behavioral disturbance: Secondary | ICD-10-CM | POA: Diagnosis not present

## 2017-02-04 DIAGNOSIS — I129 Hypertensive chronic kidney disease with stage 1 through stage 4 chronic kidney disease, or unspecified chronic kidney disease: Secondary | ICD-10-CM | POA: Diagnosis not present

## 2017-02-04 DIAGNOSIS — G20 Parkinson's disease: Secondary | ICD-10-CM | POA: Diagnosis not present

## 2017-02-04 DIAGNOSIS — G309 Alzheimer's disease, unspecified: Secondary | ICD-10-CM | POA: Diagnosis not present

## 2017-02-04 DIAGNOSIS — R1312 Dysphagia, oropharyngeal phase: Secondary | ICD-10-CM | POA: Diagnosis not present

## 2017-02-04 DIAGNOSIS — N183 Chronic kidney disease, stage 3 (moderate): Secondary | ICD-10-CM | POA: Diagnosis not present

## 2017-02-05 DIAGNOSIS — R1312 Dysphagia, oropharyngeal phase: Secondary | ICD-10-CM | POA: Diagnosis not present

## 2017-02-05 DIAGNOSIS — I129 Hypertensive chronic kidney disease with stage 1 through stage 4 chronic kidney disease, or unspecified chronic kidney disease: Secondary | ICD-10-CM | POA: Diagnosis not present

## 2017-02-05 DIAGNOSIS — N183 Chronic kidney disease, stage 3 (moderate): Secondary | ICD-10-CM | POA: Diagnosis not present

## 2017-02-05 DIAGNOSIS — F0280 Dementia in other diseases classified elsewhere without behavioral disturbance: Secondary | ICD-10-CM | POA: Diagnosis not present

## 2017-02-05 DIAGNOSIS — G20 Parkinson's disease: Secondary | ICD-10-CM | POA: Diagnosis not present

## 2017-02-05 DIAGNOSIS — G309 Alzheimer's disease, unspecified: Secondary | ICD-10-CM | POA: Diagnosis not present

## 2017-02-06 ENCOUNTER — Ambulatory Visit (HOSPITAL_COMMUNITY)
Admission: RE | Admit: 2017-02-06 | Discharge: 2017-02-06 | Disposition: A | Discharge status: Home / Self Care | Payer: Medicare Other | Source: Ambulatory Visit | Attending: Family Medicine | Admitting: Family Medicine

## 2017-02-06 DIAGNOSIS — N183 Chronic kidney disease, stage 3 (moderate): Secondary | ICD-10-CM | POA: Diagnosis not present

## 2017-02-06 DIAGNOSIS — R1319 Other dysphagia: Secondary | ICD-10-CM | POA: Insufficient documentation

## 2017-02-06 DIAGNOSIS — R1312 Dysphagia, oropharyngeal phase: Secondary | ICD-10-CM | POA: Diagnosis not present

## 2017-02-06 DIAGNOSIS — R4702 Dysphasia: Secondary | ICD-10-CM | POA: Diagnosis not present

## 2017-02-06 DIAGNOSIS — F0280 Dementia in other diseases classified elsewhere without behavioral disturbance: Secondary | ICD-10-CM | POA: Diagnosis not present

## 2017-02-06 DIAGNOSIS — G20 Parkinson's disease: Secondary | ICD-10-CM | POA: Diagnosis not present

## 2017-02-06 DIAGNOSIS — G309 Alzheimer's disease, unspecified: Secondary | ICD-10-CM | POA: Diagnosis not present

## 2017-02-06 DIAGNOSIS — I129 Hypertensive chronic kidney disease with stage 1 through stage 4 chronic kidney disease, or unspecified chronic kidney disease: Secondary | ICD-10-CM | POA: Diagnosis not present

## 2017-02-06 DIAGNOSIS — F39 Unspecified mood [affective] disorder: Secondary | ICD-10-CM | POA: Diagnosis not present

## 2017-02-06 NOTE — Progress Notes (Signed)
Modified Barium Swallow Progress Note  Patient Details  Name: [name] MRN: [id number] Date of Birth: 02/11/1935  Today's Date: 02/06/2017  Modified Barium Swallow completed.  Full report located under Chart Review in the Imaging Section.  Brief recommendations include the following:  Clinical Impression  Pt's lower denture plate not present during study resulting in inefficient and reduced mastication wtih solids. Mild-moderate motor and sensory pharyngeal deficits marked by decreased swallow initiation, decreased anterior hyoid excursion, laryngeal elevation and reduced closure leading to silent laryngeal penetration before (nectar) and aspiration during the swallow which did elicit cough x 1 due to larger amount aspirated with thin. Repeated chin tuck trials were ineffective to prevent larygneal intrusion. Laryngeal penetrates with nectar were mostly cleared with verbal cues for hard throat clear. Pharyngeal residue was not concerning in general although pt had mild-moderate nectar residue at end of study likely due to fatigue. Recommend continue nectar liqiuds and SLP observe pt with upper and lower dentures donned with more solid textures for potential upgrade. She will continue to be an aspiration risk therefore strict adherence to additional precautions is recommended:  volitional throat clear after every 2-3 bites/sips, pills whole in puree, cup sips preferred.      Swallow Evaluation Recommendations       SLP Diet Recommendations: Dysphagia 1 (Puree) solids;Thin liquid   Liquid Administration via: Cup;No straw   Medication Administration: Whole meds with puree   Supervision: Patient able to self feed;Full supervision/cueing for compensatory strategies   Compensations: Slow rate;Small sips/bites;Minimize environmental distractions;Clear throat intermittently   Postural Changes: Seated upright at 90 degrees   Oral Care Recommendations: Oral care BID        Houston Siren 02/06/2017,2:42 PM   Orbie Pyo Colvin Caroli.Ed [employer] [phone]

## 2017-02-07 DIAGNOSIS — R1312 Dysphagia, oropharyngeal phase: Secondary | ICD-10-CM | POA: Diagnosis not present

## 2017-02-07 DIAGNOSIS — I129 Hypertensive chronic kidney disease with stage 1 through stage 4 chronic kidney disease, or unspecified chronic kidney disease: Secondary | ICD-10-CM | POA: Diagnosis not present

## 2017-02-07 DIAGNOSIS — G20 Parkinson's disease: Secondary | ICD-10-CM | POA: Diagnosis not present

## 2017-02-07 DIAGNOSIS — G309 Alzheimer's disease, unspecified: Secondary | ICD-10-CM | POA: Diagnosis not present

## 2017-02-07 DIAGNOSIS — N183 Chronic kidney disease, stage 3 (moderate): Secondary | ICD-10-CM | POA: Diagnosis not present

## 2017-02-07 DIAGNOSIS — F0280 Dementia in other diseases classified elsewhere without behavioral disturbance: Secondary | ICD-10-CM | POA: Diagnosis not present

## 2017-02-11 DIAGNOSIS — N183 Chronic kidney disease, stage 3 (moderate): Secondary | ICD-10-CM | POA: Diagnosis not present

## 2017-02-11 DIAGNOSIS — G309 Alzheimer's disease, unspecified: Secondary | ICD-10-CM | POA: Diagnosis not present

## 2017-02-11 DIAGNOSIS — I129 Hypertensive chronic kidney disease with stage 1 through stage 4 chronic kidney disease, or unspecified chronic kidney disease: Secondary | ICD-10-CM | POA: Diagnosis not present

## 2017-02-11 DIAGNOSIS — R1312 Dysphagia, oropharyngeal phase: Secondary | ICD-10-CM | POA: Diagnosis not present

## 2017-02-11 DIAGNOSIS — F0280 Dementia in other diseases classified elsewhere without behavioral disturbance: Secondary | ICD-10-CM | POA: Diagnosis not present

## 2017-02-11 DIAGNOSIS — G20 Parkinson's disease: Secondary | ICD-10-CM | POA: Diagnosis not present

## 2017-02-13 DIAGNOSIS — I129 Hypertensive chronic kidney disease with stage 1 through stage 4 chronic kidney disease, or unspecified chronic kidney disease: Secondary | ICD-10-CM | POA: Diagnosis not present

## 2017-02-13 DIAGNOSIS — N183 Chronic kidney disease, stage 3 (moderate): Secondary | ICD-10-CM | POA: Diagnosis not present

## 2017-02-13 DIAGNOSIS — G20 Parkinson's disease: Secondary | ICD-10-CM | POA: Diagnosis not present

## 2017-02-13 DIAGNOSIS — R1312 Dysphagia, oropharyngeal phase: Secondary | ICD-10-CM | POA: Diagnosis not present

## 2017-02-13 DIAGNOSIS — F0280 Dementia in other diseases classified elsewhere without behavioral disturbance: Secondary | ICD-10-CM | POA: Diagnosis not present

## 2017-02-13 DIAGNOSIS — G309 Alzheimer's disease, unspecified: Secondary | ICD-10-CM | POA: Diagnosis not present

## 2017-02-14 DIAGNOSIS — I129 Hypertensive chronic kidney disease with stage 1 through stage 4 chronic kidney disease, or unspecified chronic kidney disease: Secondary | ICD-10-CM | POA: Diagnosis not present

## 2017-02-14 DIAGNOSIS — F0280 Dementia in other diseases classified elsewhere without behavioral disturbance: Secondary | ICD-10-CM | POA: Diagnosis not present

## 2017-02-14 DIAGNOSIS — G20 Parkinson's disease: Secondary | ICD-10-CM | POA: Diagnosis not present

## 2017-02-14 DIAGNOSIS — R1312 Dysphagia, oropharyngeal phase: Secondary | ICD-10-CM | POA: Diagnosis not present

## 2017-02-14 DIAGNOSIS — G309 Alzheimer's disease, unspecified: Secondary | ICD-10-CM | POA: Diagnosis not present

## 2017-02-14 DIAGNOSIS — N183 Chronic kidney disease, stage 3 (moderate): Secondary | ICD-10-CM | POA: Diagnosis not present

## 2017-02-18 DIAGNOSIS — N183 Chronic kidney disease, stage 3 (moderate): Secondary | ICD-10-CM | POA: Diagnosis not present

## 2017-02-18 DIAGNOSIS — F0280 Dementia in other diseases classified elsewhere without behavioral disturbance: Secondary | ICD-10-CM | POA: Diagnosis not present

## 2017-02-18 DIAGNOSIS — R1312 Dysphagia, oropharyngeal phase: Secondary | ICD-10-CM | POA: Diagnosis not present

## 2017-02-18 DIAGNOSIS — G20 Parkinson's disease: Secondary | ICD-10-CM | POA: Diagnosis not present

## 2017-02-18 DIAGNOSIS — I129 Hypertensive chronic kidney disease with stage 1 through stage 4 chronic kidney disease, or unspecified chronic kidney disease: Secondary | ICD-10-CM | POA: Diagnosis not present

## 2017-02-18 DIAGNOSIS — G309 Alzheimer's disease, unspecified: Secondary | ICD-10-CM | POA: Diagnosis not present

## 2017-02-19 DIAGNOSIS — I10 Essential (primary) hypertension: Secondary | ICD-10-CM | POA: Diagnosis not present

## 2017-02-19 DIAGNOSIS — R1311 Dysphagia, oral phase: Secondary | ICD-10-CM | POA: Diagnosis not present

## 2017-02-19 DIAGNOSIS — D649 Anemia, unspecified: Secondary | ICD-10-CM | POA: Diagnosis not present

## 2017-02-19 DIAGNOSIS — M6281 Muscle weakness (generalized): Secondary | ICD-10-CM | POA: Diagnosis not present

## 2017-02-19 DIAGNOSIS — F0280 Dementia in other diseases classified elsewhere without behavioral disturbance: Secondary | ICD-10-CM | POA: Diagnosis not present

## 2017-02-19 DIAGNOSIS — G20 Parkinson's disease: Secondary | ICD-10-CM | POA: Diagnosis not present

## 2017-02-19 DIAGNOSIS — R1312 Dysphagia, oropharyngeal phase: Secondary | ICD-10-CM | POA: Diagnosis not present

## 2017-02-19 DIAGNOSIS — G309 Alzheimer's disease, unspecified: Secondary | ICD-10-CM | POA: Diagnosis not present

## 2017-02-19 DIAGNOSIS — I129 Hypertensive chronic kidney disease with stage 1 through stage 4 chronic kidney disease, or unspecified chronic kidney disease: Secondary | ICD-10-CM | POA: Diagnosis not present

## 2017-02-19 DIAGNOSIS — N183 Chronic kidney disease, stage 3 (moderate): Secondary | ICD-10-CM | POA: Diagnosis not present

## 2017-02-20 DIAGNOSIS — R634 Abnormal weight loss: Secondary | ICD-10-CM | POA: Diagnosis not present

## 2017-02-20 DIAGNOSIS — I679 Cerebrovascular disease, unspecified: Secondary | ICD-10-CM | POA: Diagnosis not present

## 2017-02-20 DIAGNOSIS — N390 Urinary tract infection, site not specified: Secondary | ICD-10-CM | POA: Diagnosis not present

## 2017-02-20 DIAGNOSIS — I10 Essential (primary) hypertension: Secondary | ICD-10-CM | POA: Diagnosis not present

## 2017-02-20 DIAGNOSIS — R1310 Dysphagia, unspecified: Secondary | ICD-10-CM | POA: Diagnosis not present

## 2017-02-20 DIAGNOSIS — E039 Hypothyroidism, unspecified: Secondary | ICD-10-CM | POA: Diagnosis not present

## 2017-02-20 DIAGNOSIS — K219 Gastro-esophageal reflux disease without esophagitis: Secondary | ICD-10-CM | POA: Diagnosis not present

## 2017-02-20 DIAGNOSIS — F411 Generalized anxiety disorder: Secondary | ICD-10-CM | POA: Diagnosis not present

## 2017-02-20 DIAGNOSIS — F339 Major depressive disorder, recurrent, unspecified: Secondary | ICD-10-CM | POA: Diagnosis not present

## 2017-02-20 DIAGNOSIS — N183 Chronic kidney disease, stage 3 (moderate): Secondary | ICD-10-CM | POA: Diagnosis not present

## 2017-02-20 DIAGNOSIS — E785 Hyperlipidemia, unspecified: Secondary | ICD-10-CM | POA: Diagnosis not present

## 2017-02-20 DIAGNOSIS — J45909 Unspecified asthma, uncomplicated: Secondary | ICD-10-CM | POA: Diagnosis not present

## 2017-02-20 DIAGNOSIS — J309 Allergic rhinitis, unspecified: Secondary | ICD-10-CM | POA: Diagnosis not present

## 2017-02-20 DIAGNOSIS — F0281 Dementia in other diseases classified elsewhere with behavioral disturbance: Secondary | ICD-10-CM | POA: Diagnosis not present

## 2017-02-20 DIAGNOSIS — G20 Parkinson's disease: Secondary | ICD-10-CM | POA: Diagnosis not present

## 2017-02-21 DIAGNOSIS — I679 Cerebrovascular disease, unspecified: Secondary | ICD-10-CM | POA: Diagnosis not present

## 2017-02-21 DIAGNOSIS — F0281 Dementia in other diseases classified elsewhere with behavioral disturbance: Secondary | ICD-10-CM | POA: Diagnosis not present

## 2017-02-21 DIAGNOSIS — I10 Essential (primary) hypertension: Secondary | ICD-10-CM | POA: Diagnosis not present

## 2017-02-21 DIAGNOSIS — E785 Hyperlipidemia, unspecified: Secondary | ICD-10-CM | POA: Diagnosis not present

## 2017-02-21 DIAGNOSIS — N183 Chronic kidney disease, stage 3 (moderate): Secondary | ICD-10-CM | POA: Diagnosis not present

## 2017-02-21 DIAGNOSIS — G20 Parkinson's disease: Secondary | ICD-10-CM | POA: Diagnosis not present

## 2017-02-22 DIAGNOSIS — E785 Hyperlipidemia, unspecified: Secondary | ICD-10-CM | POA: Diagnosis not present

## 2017-02-22 DIAGNOSIS — I679 Cerebrovascular disease, unspecified: Secondary | ICD-10-CM | POA: Diagnosis not present

## 2017-02-22 DIAGNOSIS — I10 Essential (primary) hypertension: Secondary | ICD-10-CM | POA: Diagnosis not present

## 2017-02-22 DIAGNOSIS — F0281 Dementia in other diseases classified elsewhere with behavioral disturbance: Secondary | ICD-10-CM | POA: Diagnosis not present

## 2017-02-22 DIAGNOSIS — N183 Chronic kidney disease, stage 3 (moderate): Secondary | ICD-10-CM | POA: Diagnosis not present

## 2017-02-22 DIAGNOSIS — G20 Parkinson's disease: Secondary | ICD-10-CM | POA: Diagnosis not present

## 2017-02-25 DIAGNOSIS — N183 Chronic kidney disease, stage 3 (moderate): Secondary | ICD-10-CM | POA: Diagnosis not present

## 2017-02-25 DIAGNOSIS — F0281 Dementia in other diseases classified elsewhere with behavioral disturbance: Secondary | ICD-10-CM | POA: Diagnosis not present

## 2017-02-25 DIAGNOSIS — G20 Parkinson's disease: Secondary | ICD-10-CM | POA: Diagnosis not present

## 2017-02-25 DIAGNOSIS — E785 Hyperlipidemia, unspecified: Secondary | ICD-10-CM | POA: Diagnosis not present

## 2017-02-25 DIAGNOSIS — I10 Essential (primary) hypertension: Secondary | ICD-10-CM | POA: Diagnosis not present

## 2017-02-25 DIAGNOSIS — I679 Cerebrovascular disease, unspecified: Secondary | ICD-10-CM | POA: Diagnosis not present

## 2017-02-27 DIAGNOSIS — F411 Generalized anxiety disorder: Secondary | ICD-10-CM | POA: Diagnosis not present

## 2017-02-27 DIAGNOSIS — G20 Parkinson's disease: Secondary | ICD-10-CM | POA: Diagnosis not present

## 2017-02-27 DIAGNOSIS — E039 Hypothyroidism, unspecified: Secondary | ICD-10-CM | POA: Diagnosis not present

## 2017-02-27 DIAGNOSIS — N390 Urinary tract infection, site not specified: Secondary | ICD-10-CM | POA: Diagnosis not present

## 2017-02-27 DIAGNOSIS — R634 Abnormal weight loss: Secondary | ICD-10-CM | POA: Diagnosis not present

## 2017-02-27 DIAGNOSIS — F339 Major depressive disorder, recurrent, unspecified: Secondary | ICD-10-CM | POA: Diagnosis not present

## 2017-02-27 DIAGNOSIS — K219 Gastro-esophageal reflux disease without esophagitis: Secondary | ICD-10-CM | POA: Diagnosis not present

## 2017-02-27 DIAGNOSIS — J45909 Unspecified asthma, uncomplicated: Secondary | ICD-10-CM | POA: Diagnosis not present

## 2017-02-27 DIAGNOSIS — E785 Hyperlipidemia, unspecified: Secondary | ICD-10-CM | POA: Diagnosis not present

## 2017-02-27 DIAGNOSIS — F0281 Dementia in other diseases classified elsewhere with behavioral disturbance: Secondary | ICD-10-CM | POA: Diagnosis not present

## 2017-02-27 DIAGNOSIS — I10 Essential (primary) hypertension: Secondary | ICD-10-CM | POA: Diagnosis not present

## 2017-02-27 DIAGNOSIS — I679 Cerebrovascular disease, unspecified: Secondary | ICD-10-CM | POA: Diagnosis not present

## 2017-02-27 DIAGNOSIS — N183 Chronic kidney disease, stage 3 (moderate): Secondary | ICD-10-CM | POA: Diagnosis not present

## 2017-02-27 DIAGNOSIS — J309 Allergic rhinitis, unspecified: Secondary | ICD-10-CM | POA: Diagnosis not present

## 2017-02-27 DIAGNOSIS — R1310 Dysphagia, unspecified: Secondary | ICD-10-CM | POA: Diagnosis not present

## 2017-02-28 DIAGNOSIS — E785 Hyperlipidemia, unspecified: Secondary | ICD-10-CM | POA: Diagnosis not present

## 2017-02-28 DIAGNOSIS — F0281 Dementia in other diseases classified elsewhere with behavioral disturbance: Secondary | ICD-10-CM | POA: Diagnosis not present

## 2017-02-28 DIAGNOSIS — I679 Cerebrovascular disease, unspecified: Secondary | ICD-10-CM | POA: Diagnosis not present

## 2017-02-28 DIAGNOSIS — N183 Chronic kidney disease, stage 3 (moderate): Secondary | ICD-10-CM | POA: Diagnosis not present

## 2017-02-28 DIAGNOSIS — G20 Parkinson's disease: Secondary | ICD-10-CM | POA: Diagnosis not present

## 2017-02-28 DIAGNOSIS — I10 Essential (primary) hypertension: Secondary | ICD-10-CM | POA: Diagnosis not present

## 2017-03-04 DIAGNOSIS — I10 Essential (primary) hypertension: Secondary | ICD-10-CM | POA: Diagnosis not present

## 2017-03-04 DIAGNOSIS — F0281 Dementia in other diseases classified elsewhere with behavioral disturbance: Secondary | ICD-10-CM | POA: Diagnosis not present

## 2017-03-04 DIAGNOSIS — E785 Hyperlipidemia, unspecified: Secondary | ICD-10-CM | POA: Diagnosis not present

## 2017-03-04 DIAGNOSIS — I679 Cerebrovascular disease, unspecified: Secondary | ICD-10-CM | POA: Diagnosis not present

## 2017-03-04 DIAGNOSIS — N183 Chronic kidney disease, stage 3 (moderate): Secondary | ICD-10-CM | POA: Diagnosis not present

## 2017-03-04 DIAGNOSIS — G20 Parkinson's disease: Secondary | ICD-10-CM | POA: Diagnosis not present

## 2017-03-05 DIAGNOSIS — N183 Chronic kidney disease, stage 3 (moderate): Secondary | ICD-10-CM | POA: Diagnosis not present

## 2017-03-05 DIAGNOSIS — I10 Essential (primary) hypertension: Secondary | ICD-10-CM | POA: Diagnosis not present

## 2017-03-05 DIAGNOSIS — I679 Cerebrovascular disease, unspecified: Secondary | ICD-10-CM | POA: Diagnosis not present

## 2017-03-05 DIAGNOSIS — G20 Parkinson's disease: Secondary | ICD-10-CM | POA: Diagnosis not present

## 2017-03-05 DIAGNOSIS — E785 Hyperlipidemia, unspecified: Secondary | ICD-10-CM | POA: Diagnosis not present

## 2017-03-05 DIAGNOSIS — F0281 Dementia in other diseases classified elsewhere with behavioral disturbance: Secondary | ICD-10-CM | POA: Diagnosis not present

## 2017-03-06 DIAGNOSIS — N183 Chronic kidney disease, stage 3 (moderate): Secondary | ICD-10-CM | POA: Diagnosis not present

## 2017-03-06 DIAGNOSIS — E785 Hyperlipidemia, unspecified: Secondary | ICD-10-CM | POA: Diagnosis not present

## 2017-03-06 DIAGNOSIS — G20 Parkinson's disease: Secondary | ICD-10-CM | POA: Diagnosis not present

## 2017-03-06 DIAGNOSIS — I679 Cerebrovascular disease, unspecified: Secondary | ICD-10-CM | POA: Diagnosis not present

## 2017-03-06 DIAGNOSIS — I10 Essential (primary) hypertension: Secondary | ICD-10-CM | POA: Diagnosis not present

## 2017-03-06 DIAGNOSIS — F0281 Dementia in other diseases classified elsewhere with behavioral disturbance: Secondary | ICD-10-CM | POA: Diagnosis not present

## 2017-03-07 DIAGNOSIS — F0281 Dementia in other diseases classified elsewhere with behavioral disturbance: Secondary | ICD-10-CM | POA: Diagnosis not present

## 2017-03-07 DIAGNOSIS — I10 Essential (primary) hypertension: Secondary | ICD-10-CM | POA: Diagnosis not present

## 2017-03-07 DIAGNOSIS — I679 Cerebrovascular disease, unspecified: Secondary | ICD-10-CM | POA: Diagnosis not present

## 2017-03-07 DIAGNOSIS — G20 Parkinson's disease: Secondary | ICD-10-CM | POA: Diagnosis not present

## 2017-03-07 DIAGNOSIS — N183 Chronic kidney disease, stage 3 (moderate): Secondary | ICD-10-CM | POA: Diagnosis not present

## 2017-03-07 DIAGNOSIS — E785 Hyperlipidemia, unspecified: Secondary | ICD-10-CM | POA: Diagnosis not present

## 2017-03-11 DIAGNOSIS — I10 Essential (primary) hypertension: Secondary | ICD-10-CM | POA: Diagnosis not present

## 2017-03-11 DIAGNOSIS — N183 Chronic kidney disease, stage 3 (moderate): Secondary | ICD-10-CM | POA: Diagnosis not present

## 2017-03-11 DIAGNOSIS — I679 Cerebrovascular disease, unspecified: Secondary | ICD-10-CM | POA: Diagnosis not present

## 2017-03-11 DIAGNOSIS — F0281 Dementia in other diseases classified elsewhere with behavioral disturbance: Secondary | ICD-10-CM | POA: Diagnosis not present

## 2017-03-11 DIAGNOSIS — E785 Hyperlipidemia, unspecified: Secondary | ICD-10-CM | POA: Diagnosis not present

## 2017-03-11 DIAGNOSIS — G20 Parkinson's disease: Secondary | ICD-10-CM | POA: Diagnosis not present

## 2017-03-12 ENCOUNTER — Emergency Department (HOSPITAL_COMMUNITY)
Admission: EM | Admit: 2017-03-12 | Discharge: 2017-03-12 | Disposition: A | Discharge status: Home / Self Care | Attending: Emergency Medicine | Admitting: Emergency Medicine

## 2017-03-12 ENCOUNTER — Emergency Department (HOSPITAL_COMMUNITY)

## 2017-03-12 ENCOUNTER — Encounter (HOSPITAL_COMMUNITY): Payer: Self-pay | Admitting: Emergency Medicine

## 2017-03-12 DIAGNOSIS — J45909 Unspecified asthma, uncomplicated: Secondary | ICD-10-CM | POA: Insufficient documentation

## 2017-03-12 DIAGNOSIS — G8911 Acute pain due to trauma: Secondary | ICD-10-CM | POA: Diagnosis not present

## 2017-03-12 DIAGNOSIS — I679 Cerebrovascular disease, unspecified: Secondary | ICD-10-CM | POA: Diagnosis not present

## 2017-03-12 DIAGNOSIS — G20 Parkinson's disease: Secondary | ICD-10-CM | POA: Insufficient documentation

## 2017-03-12 DIAGNOSIS — J449 Chronic obstructive pulmonary disease, unspecified: Secondary | ICD-10-CM | POA: Insufficient documentation

## 2017-03-12 DIAGNOSIS — N183 Chronic kidney disease, stage 3 (moderate): Secondary | ICD-10-CM | POA: Insufficient documentation

## 2017-03-12 DIAGNOSIS — Z7982 Long term (current) use of aspirin: Secondary | ICD-10-CM | POA: Diagnosis not present

## 2017-03-12 DIAGNOSIS — Y999 Unspecified external cause status: Secondary | ICD-10-CM | POA: Diagnosis not present

## 2017-03-12 DIAGNOSIS — S199XXA Unspecified injury of neck, initial encounter: Secondary | ICD-10-CM | POA: Diagnosis not present

## 2017-03-12 DIAGNOSIS — E039 Hypothyroidism, unspecified: Secondary | ICD-10-CM | POA: Insufficient documentation

## 2017-03-12 DIAGNOSIS — Y92122 Bedroom in nursing home as the place of occurrence of the external cause: Secondary | ICD-10-CM | POA: Diagnosis not present

## 2017-03-12 DIAGNOSIS — R51 Headache: Secondary | ICD-10-CM | POA: Diagnosis not present

## 2017-03-12 DIAGNOSIS — S0003XA Contusion of scalp, initial encounter: Secondary | ICD-10-CM

## 2017-03-12 DIAGNOSIS — I129 Hypertensive chronic kidney disease with stage 1 through stage 4 chronic kidney disease, or unspecified chronic kidney disease: Secondary | ICD-10-CM | POA: Diagnosis not present

## 2017-03-12 DIAGNOSIS — W19XXXA Unspecified fall, initial encounter: Secondary | ICD-10-CM

## 2017-03-12 DIAGNOSIS — M542 Cervicalgia: Secondary | ICD-10-CM | POA: Diagnosis not present

## 2017-03-12 DIAGNOSIS — Y939 Activity, unspecified: Secondary | ICD-10-CM | POA: Insufficient documentation

## 2017-03-12 DIAGNOSIS — Y92129 Unspecified place in nursing home as the place of occurrence of the external cause: Secondary | ICD-10-CM

## 2017-03-12 DIAGNOSIS — E785 Hyperlipidemia, unspecified: Secondary | ICD-10-CM | POA: Diagnosis not present

## 2017-03-12 DIAGNOSIS — F0281 Dementia in other diseases classified elsewhere with behavioral disturbance: Secondary | ICD-10-CM | POA: Diagnosis not present

## 2017-03-12 DIAGNOSIS — S0990XA Unspecified injury of head, initial encounter: Secondary | ICD-10-CM | POA: Diagnosis not present

## 2017-03-12 DIAGNOSIS — W0110XA Fall on same level from slipping, tripping and stumbling with subsequent striking against unspecified object, initial encounter: Secondary | ICD-10-CM | POA: Diagnosis not present

## 2017-03-12 DIAGNOSIS — G309 Alzheimer's disease, unspecified: Secondary | ICD-10-CM | POA: Insufficient documentation

## 2017-03-12 DIAGNOSIS — Z96653 Presence of artificial knee joint, bilateral: Secondary | ICD-10-CM | POA: Insufficient documentation

## 2017-03-12 DIAGNOSIS — R2689 Other abnormalities of gait and mobility: Secondary | ICD-10-CM | POA: Diagnosis not present

## 2017-03-12 DIAGNOSIS — S0091XA Abrasion of unspecified part of head, initial encounter: Secondary | ICD-10-CM | POA: Diagnosis not present

## 2017-03-12 DIAGNOSIS — I10 Essential (primary) hypertension: Secondary | ICD-10-CM | POA: Diagnosis not present

## 2017-03-12 NOTE — ED Triage Notes (Addendum)
Per EMS pt coming from [facility]. Pt was being transferred from bed to chair, pt fell and hit her head. Hematoma on back of head. Pt also complaining of right ear pain that has been present for a month. Pt has hx of dementia but is A&O x4 at this time. Pt denies LOC and is not on blood thinners.   110/72 HR 62 Resp 16 CBG 124

## 2017-03-12 NOTE — ED Provider Notes (Signed)
[facility] DEPT Provider Note   CSN: [id number] Arrival date & time: 03/12/17  0110     History   Chief Complaint Chief Complaint  Patient presents with  . Fall    HPI [name] is a 82 y.o. female.  The history is provided by the patient.  She was sent here from a nursing home where she fell while being transferred from bed to chair. She hit her head. She denies other injury, and there was no loss of consciousness. However, she has been complaining of pain in her right ear for the last 3 days. She denies back, chest, abdomen, extremity injury. She does not take any anticoagulants.  Past Medical History:  Diagnosis Date  . Acute cystitis   . Anemia   . Anxiety   . Aortic stenosis, mild 11/18/2013  . Asthma   . Back pain   . Bronchitis, acute   . Carotid artery occlusion   . Cerebrovascular disease   . COPD (chronic obstructive pulmonary disease) ([place])   . Dementia   . Depression   . Diverticulosis of colon   . DJD (degenerative joint disease)   . Fall 07/05/2015  . GERD (gastroesophageal reflux disease)   . History of sudden visual loss   . Hyperlipidemia   . Hypertension   . Hypothyroidism   . Parkinson's disease ([place])   . Pneumonia 2015   hx of x 2   . Pulmonary hypertension, moderate to severe ([place]) 11/19/2014  . Shortness of breath    with exertion   . 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 Transient ischemic attack   . UTI (lower urinary tract infection)    hx of     Patient Active Problem List   Diagnosis Date Noted  . Protein-calorie malnutrition, severe 11/05/2016  . COPD (chronic obstructive pulmonary disease) ([place]) 11/04/2016  . Stroke (cerebrum) ([place]) 11/04/2016  . CKD (chronic kidney disease), stage III 11/04/2016  . Acute encephalopathy 10/04/2016  . UTI (lower urinary tract infection) 10/17/2015  . Dementia in Alzheimer's disease 10/17/2015  . Encephalopathy, metabolic 26/94/8546  . Contusion, hip 07/07/2015  . Falls  frequently 07/07/2015  . Cervical radiculopathy 07/07/2015  . Anxiety and depression 07/07/2015  . Gait disturbance 07/07/2015  . Hyperglycemia 07/07/2015  . FTT (failure to thrive) in adult 07/07/2015  . Parkinson disease ([place])   . UTI (urinary tract infection) 07/05/2015  . Chronic neck pain 07/05/2015  . Debility 07/05/2015  . Fatigue 01/19/2015  . Dysuria 01/19/2015  . Chronic restrictive lung disease 01/04/2015  . DOE (dyspnea on exertion) 11/19/2014  . Pulmonary hypertension, moderate to severe ([place]) 11/19/2014  . Irregular heart beats 08/12/2014  . Aftercare following surgery of the circulatory system, [place] 04/19/2014  . Pain of left lower extremity-Knee 04/19/2014  . Weakness 02/11/2014  . Loss of weight 02/11/2014  . Valvular heart disease 01/19/2014  . Pulmonary hypertension ([place]) 01/19/2014  . CAP (community acquired pneumonia) 01/08/2014  . Asthma with acute exacerbation 01/06/2014  . Fall 01/06/2014  . Aortic stenosis, mild 11/18/2013  . Preventative health care 11/18/2013  . Anemia, unspecified 11/18/2013  . Tremor 09/21/2013  . Dyspnea 05/18/2013  . Physical deconditioning 05/18/2013  . OA (osteoarthritis) of knee 11/17/2012  . Hyperlipidemia 11/04/2012  . Occlusion and stenosis of carotid artery without mention of cerebral infarction 03/06/2012  . ASTHMA 10/09/2010  . Sudden visual loss 01/10/2009  . TRANSIENT ISCHEMIC ATTACK 01/10/2009  . CEREBROVASCULAR DISEASE 11/19/2007  .  BRONCHITIS, RECURRENT 11/19/2007  . DIVERTICULOSIS OF COLON 11/19/2007  . Hypothyroidism 08/15/2007  . Anxiety state 08/15/2007  . Depression 08/15/2007  . Essential hypertension 08/15/2007  . GERD 08/15/2007  . Osteoarthritis 08/15/2007  . BACK PAIN, LUMBAR 08/15/2007    Past Surgical History:  Procedure Laterality Date  . CARDIAC CATHETERIZATION  02/18/14  . CARDIOTHORACIC PROCEDURE  01/13/2009   Right   . COLECTOMY    . ENDARTERECTOMY     right carotid endartarectomy -  2010  . FOOT SURGERY     nerve cut between toes   . LEFT AND RIGHT HEART CATHETERIZATION WITH CORONARY ANGIOGRAM N/A 02/18/2014   Procedure: LEFT AND RIGHT HEART CATHETERIZATION WITH CORONARY ANGIOGRAM;  Surgeon: Larey Dresser, MD;  Location: [facility] CATH LAB;  Service: Cardiovascular;  Laterality: N/A;  . REPLACEMENT TOTAL KNEE     right and left knee  . TEE WITHOUT CARDIOVERSION N/A 02/08/2014   Procedure: TRANSESOPHAGEAL ECHOCARDIOGRAM (TEE);  Surgeon: Larey Dresser, MD;  Location: [facility];  Service: Cardiovascular;  Laterality: N/A;  . TOTAL KNEE ARTHROPLASTY Left 11/17/2012   Procedure: TOTAL KNEE ARTHROPLASTY;  Surgeon: Gearlean Alf, MD;  Location: [facility]S;  Service: Orthopedics;  Laterality: Left;  . TUBAL LIGATION    . VAGINAL HYSTERECTOMY      OB History    No data available       Home Medications    Prior to Admission medications   Medication Sig Start Date End Date Taking? Authorizing Provider  ADVAIR HFA 230-21 MCG/ACT inhaler Inhale 2 puffs into the lungs 2 (two) times daily. 11/23/14   Biagio Borg, MD  aspirin 81 MG chewable tablet Chew 81 mg by mouth daily.    [provider]  carbidopa-levodopa (SINEMET IR) 25-100 MG tablet Take 1 tablet by mouth 3 (three) times daily. for parkinson's disease    [provider]  ciprofloxacin (CIPRO) 500 MG tablet Take 1 tablet (500 mg total) by mouth 2 (two) times daily. One po bid x 7 days 02/01/17   Veryl Speak, MD  ciprofloxacin (CIPRO) 500 MG tablet Take 1 tablet (500 mg total) by mouth 2 (two) times daily. One po bid x 7 days 02/01/17   Veryl Speak, MD  DIAZEPAM PO Take 2 mg by mouth 3 (three) times daily.    [provider]  divalproex (DEPAKOTE) 125 MG DR tablet Take 250 mg by mouth 3 (three) times daily.    [provider]  escitalopram (LEXAPRO) 10 MG tablet Take 10 mg by mouth daily.    [provider]  HYDROcodone-acetaminophen (NORCO) 5-325 MG tablet Take 1-2 tablets by  mouth every 6 (six) hours as needed. 02/01/17   Veryl Speak, MD  levothyroxine (SYNTHROID) 88 MCG tablet Take 88 mcg by mouth daily at 6 (six) AM.     [provider]  neomycin-polymyxin-hydrocortisone (CORTISPORIN) 3.5-10000-1 OTIC suspension Place 4 drops into the right ear 4 (four) times daily. X 7 days 02/01/17   Veryl Speak, MD  omeprazole (PRILOSEC) 20 MG capsule Take 20 mg by mouth daily.    [provider]  polyethylene glycol (MIRALAX / GLYCOLAX) packet Take 17 g by mouth daily. Mix in 8 oz liquid and drink    [provider]  senna-docusate (SENNA S) 8.6-50 MG tablet Take 1 tablet by mouth daily as needed (constipation).    [provider]    Family History Family History  Problem Relation Age of Onset  . Emphysema Father   .  Heart disease Mother   . Hyperlipidemia Mother   . Hypertension Mother   . Coronary artery disease Sister   . Heart disease Sister   . Hypertension Sister   . Varicose Veins Sister   . AAA (abdominal aortic aneurysm) Sister   . Rheumatic fever Brother        x2  . Heart attack Brother     Social History Social History  Substance Use Topics  . Smoking status: Never Smoker  . Smokeless tobacco: Never Used  . Alcohol use No     Allergies   Relafen [nabumetone]; Lipitor [atorvastatin]; Other; and Penicillins   Review of Systems Review of Systems  All other systems reviewed and are negative.    Physical Exam Updated Vital Signs BP (!) 116/51   Pulse 62   Temp 97.9 F (36.6 C) (Oral)   Resp 18   Ht 5\' 5"  (1.651 m)   Wt 59 kg (130 lb)   SpO2 96%   BMI 21.63 kg/m   Physical Exam  Nursing note and vitals reviewed.  82 year old female, resting comfortably and in no acute distress. Vital signs are normal. Oxygen saturation is 96%, which is normal. Head is normocephalic. Hematoma present on the left side of occiput. PERRLA, EOMI. Oropharynx is clear. Tympanic membranes are clear. Neck is nontender  without adenopathy or JVD. Back is nontender and there is no CVA tenderness. Lungs are clear without rales, wheezes, or rhonchi. Chest is nontender. Heart has regular rate and rhythm without murmur. Abdomen is soft, flat, nontender without masses or hepatosplenomegaly and peristalsis is normoactive. Extremities have no cyanosis or edema, full range of motion is present. Skin is warm and dry without rash. Neurologic: Mental status is normal, cranial nerves are intact, there are no motor or sensory deficits.  ED Treatments / Results   Radiology Ct Head Wo Contrast  Result Date: 03/12/2017 CLINICAL DATA:  Status post fall. Hit head on floor, with right ear pain and left-sided head knot. Concern for cervical spine injury. Initial encounter. EXAM: CT HEAD WITHOUT CONTRAST CT CERVICAL SPINE WITHOUT CONTRAST TECHNIQUE: Multidetector CT imaging of the head and cervical spine was performed following the standard protocol without intravenous contrast. Multiplanar CT image reconstructions of the cervical spine were also generated. COMPARISON:  CT of the head performed 11/04/2016, and CT of the cervical spine performed 10/05/2016 FINDINGS: CT HEAD FINDINGS Brain: No evidence of acute infarction, hemorrhage, hydrocephalus, extra-axial collection or mass lesion/mass effect. Prominence of the ventricles and sulci reflects moderate cortical volume loss. Cerebellar atrophy is noted. Scattered periventricular subcortical white matter change likely reflects small vessel ischemic microangiopathy. The brainstem and fourth ventricle are within normal limits. The basal ganglia are unremarkable in appearance. The cerebral hemispheres demonstrate grossly normal gray-white differentiation. No mass effect or midline shift is seen. Vascular: No hyperdense vessel or unexpected calcification. Skull: There is no evidence of fracture; visualized osseous structures are unremarkable in appearance. Sinuses/Orbits: The orbits are within  normal limits. The paranasal sinuses and mastoid air cells are well-aerated. Other: Soft tissue swelling is noted overlying the high left parietal calvarium. CT CERVICAL SPINE FINDINGS Alignment: Normal. Skull base and vertebrae: No acute fracture. No primary bone lesion or focal pathologic process. Soft tissues and spinal canal: No prevertebral fluid or swelling. No visible canal hematoma. Disc levels: Multilevel disc space narrowing is noted along the cervical spine, with scattered anterior and posterior disc osteophyte complexes. Upper chest: Scarring is noted at the lung apices. The thyroid  gland is diminutive and grossly unremarkable. Calcification is noted at the carotid bifurcations bilaterally. There is mild-to-moderate left-sided luminal narrowing. Other: No additional soft tissue abnormalities are seen. IMPRESSION: 1. No evidence of traumatic intracranial injury or fracture. 2. No evidence of fracture or subluxation along the cervical spine. 3. Soft tissue swelling noted overlying the high left parietal calvarium. 4. Moderate cortical volume loss and scattered small vessel ischemic microangiopathy. 5. Degenerative change noted along the cervical spine. 6. Scarring at the lung apices. 7. Calcification at the carotid bifurcations bilaterally. Mild-to-moderate left-sided luminal narrowing noted. Carotid ultrasound would be helpful for further evaluation, when and as deemed clinically appropriate. Electronically Signed   By: Garald Balding M.D.   On: 03/12/2017 03:03   Ct Cervical Spine Wo Contrast  Result Date: 03/12/2017 CLINICAL DATA:  Status post fall. Hit head on floor, with right ear pain and left-sided head knot. Concern for cervical spine injury. Initial encounter. EXAM: CT HEAD WITHOUT CONTRAST CT CERVICAL SPINE WITHOUT CONTRAST TECHNIQUE: Multidetector CT imaging of the head and cervical spine was performed following the standard protocol without intravenous contrast. Multiplanar CT image  reconstructions of the cervical spine were also generated. COMPARISON:  CT of the head performed 11/04/2016, and CT of the cervical spine performed 10/05/2016 FINDINGS: CT HEAD FINDINGS Brain: No evidence of acute infarction, hemorrhage, hydrocephalus, extra-axial collection or mass lesion/mass effect. Prominence of the ventricles and sulci reflects moderate cortical volume loss. Cerebellar atrophy is noted. Scattered periventricular subcortical white matter change likely reflects small vessel ischemic microangiopathy. The brainstem and fourth ventricle are within normal limits. The basal ganglia are unremarkable in appearance. The cerebral hemispheres demonstrate grossly normal gray-white differentiation. No mass effect or midline shift is seen. Vascular: No hyperdense vessel or unexpected calcification. Skull: There is no evidence of fracture; visualized osseous structures are unremarkable in appearance. Sinuses/Orbits: The orbits are within normal limits. The paranasal sinuses and mastoid air cells are well-aerated. Other: Soft tissue swelling is noted overlying the high left parietal calvarium. CT CERVICAL SPINE FINDINGS Alignment: Normal. Skull base and vertebrae: No acute fracture. No primary bone lesion or focal pathologic process. Soft tissues and spinal canal: No prevertebral fluid or swelling. No visible canal hematoma. Disc levels: Multilevel disc space narrowing is noted along the cervical spine, with scattered anterior and posterior disc osteophyte complexes. Upper chest: Scarring is noted at the lung apices. The thyroid gland is diminutive and grossly unremarkable. Calcification is noted at the carotid bifurcations bilaterally. There is mild-to-moderate left-sided luminal narrowing. Other: No additional soft tissue abnormalities are seen. IMPRESSION: 1. No evidence of traumatic intracranial injury or fracture. 2. No evidence of fracture or subluxation along the cervical spine. 3. Soft tissue swelling  noted overlying the high left parietal calvarium. 4. Moderate cortical volume loss and scattered small vessel ischemic microangiopathy. 5. Degenerative change noted along the cervical spine. 6. Scarring at the lung apices. 7. Calcification at the carotid bifurcations bilaterally. Mild-to-moderate left-sided luminal narrowing noted. Carotid ultrasound would be helpful for further evaluation, when and as deemed clinically appropriate. Electronically Signed   By: Garald Balding M.D.   On: 03/12/2017 03:03    Procedures Procedures (including critical care time)  Medications Ordered in ED Medications - No data to display   Initial Impression / Assessment and Plan / ED Course  I have reviewed the triage vital signs and the nursing notes.  Pertinent imaging results that were available during my care of the patient were reviewed by me and considered in my  medical decision making (see chart for details).  Fall with scalp hematoma. Right ear pain of uncertain cause-normal exam. Old records are reviewed, and she does have prior ED visits for falls. She'll be sent for CT of head and cervical spine.  CT scans show no acute injury. She is return to the nursing home to continue her current care plan.  Final Clinical Impressions(s) / ED Diagnoses   Final diagnoses:  Fall at nursing home, initial encounter  Contusion of scalp, initial encounter    New Prescriptions New Prescriptions   No medications on file     Delora Fuel, MD 70/14/10 [phone]

## 2017-03-12 NOTE — ED Notes (Signed)
Bed: WA09 Expected date:  Expected time:  Means of arrival:  Comments: 82 yo F  Fall, head injury

## 2017-03-12 NOTE — ED Notes (Signed)
[facility] contacted for discharge transport back to [facility]

## 2017-03-13 DIAGNOSIS — G20 Parkinson's disease: Secondary | ICD-10-CM | POA: Diagnosis not present

## 2017-03-13 DIAGNOSIS — F0281 Dementia in other diseases classified elsewhere with behavioral disturbance: Secondary | ICD-10-CM | POA: Diagnosis not present

## 2017-03-13 DIAGNOSIS — N183 Chronic kidney disease, stage 3 (moderate): Secondary | ICD-10-CM | POA: Diagnosis not present

## 2017-03-13 DIAGNOSIS — I10 Essential (primary) hypertension: Secondary | ICD-10-CM | POA: Diagnosis not present

## 2017-03-13 DIAGNOSIS — I679 Cerebrovascular disease, unspecified: Secondary | ICD-10-CM | POA: Diagnosis not present

## 2017-03-13 DIAGNOSIS — E785 Hyperlipidemia, unspecified: Secondary | ICD-10-CM | POA: Diagnosis not present

## 2017-03-18 DIAGNOSIS — G20 Parkinson's disease: Secondary | ICD-10-CM | POA: Diagnosis not present

## 2017-03-18 DIAGNOSIS — E785 Hyperlipidemia, unspecified: Secondary | ICD-10-CM | POA: Diagnosis not present

## 2017-03-18 DIAGNOSIS — I10 Essential (primary) hypertension: Secondary | ICD-10-CM | POA: Diagnosis not present

## 2017-03-18 DIAGNOSIS — I679 Cerebrovascular disease, unspecified: Secondary | ICD-10-CM | POA: Diagnosis not present

## 2017-03-18 DIAGNOSIS — F0281 Dementia in other diseases classified elsewhere with behavioral disturbance: Secondary | ICD-10-CM | POA: Diagnosis not present

## 2017-03-18 DIAGNOSIS — N183 Chronic kidney disease, stage 3 (moderate): Secondary | ICD-10-CM | POA: Diagnosis not present

## 2017-03-19 DIAGNOSIS — H903 Sensorineural hearing loss, bilateral: Secondary | ICD-10-CM | POA: Diagnosis not present

## 2017-03-19 DIAGNOSIS — H9201 Otalgia, right ear: Secondary | ICD-10-CM | POA: Diagnosis not present

## 2017-03-19 DIAGNOSIS — K08109 Complete loss of teeth, unspecified cause, unspecified class: Secondary | ICD-10-CM | POA: Diagnosis not present

## 2017-03-19 DIAGNOSIS — M26623 Arthralgia of bilateral temporomandibular joint: Secondary | ICD-10-CM | POA: Diagnosis not present

## 2017-03-19 DIAGNOSIS — F419 Anxiety disorder, unspecified: Secondary | ICD-10-CM | POA: Diagnosis not present

## 2017-03-19 DIAGNOSIS — J342 Deviated nasal septum: Secondary | ICD-10-CM | POA: Diagnosis not present

## 2017-03-19 DIAGNOSIS — G20 Parkinson's disease: Secondary | ICD-10-CM | POA: Diagnosis not present

## 2017-03-20 DIAGNOSIS — E785 Hyperlipidemia, unspecified: Secondary | ICD-10-CM | POA: Diagnosis not present

## 2017-03-20 DIAGNOSIS — F0281 Dementia in other diseases classified elsewhere with behavioral disturbance: Secondary | ICD-10-CM | POA: Diagnosis not present

## 2017-03-20 DIAGNOSIS — I679 Cerebrovascular disease, unspecified: Secondary | ICD-10-CM | POA: Diagnosis not present

## 2017-03-20 DIAGNOSIS — G20 Parkinson's disease: Secondary | ICD-10-CM | POA: Diagnosis not present

## 2017-03-20 DIAGNOSIS — N183 Chronic kidney disease, stage 3 (moderate): Secondary | ICD-10-CM | POA: Diagnosis not present

## 2017-03-20 DIAGNOSIS — I10 Essential (primary) hypertension: Secondary | ICD-10-CM | POA: Diagnosis not present

## 2017-03-25 DIAGNOSIS — F0281 Dementia in other diseases classified elsewhere with behavioral disturbance: Secondary | ICD-10-CM | POA: Diagnosis not present

## 2017-03-25 DIAGNOSIS — N183 Chronic kidney disease, stage 3 (moderate): Secondary | ICD-10-CM | POA: Diagnosis not present

## 2017-03-25 DIAGNOSIS — G20 Parkinson's disease: Secondary | ICD-10-CM | POA: Diagnosis not present

## 2017-03-25 DIAGNOSIS — I679 Cerebrovascular disease, unspecified: Secondary | ICD-10-CM | POA: Diagnosis not present

## 2017-03-25 DIAGNOSIS — I10 Essential (primary) hypertension: Secondary | ICD-10-CM | POA: Diagnosis not present

## 2017-03-25 DIAGNOSIS — E785 Hyperlipidemia, unspecified: Secondary | ICD-10-CM | POA: Diagnosis not present

## 2017-03-26 DIAGNOSIS — I679 Cerebrovascular disease, unspecified: Secondary | ICD-10-CM | POA: Diagnosis not present

## 2017-03-26 DIAGNOSIS — G20 Parkinson's disease: Secondary | ICD-10-CM | POA: Diagnosis not present

## 2017-03-26 DIAGNOSIS — N183 Chronic kidney disease, stage 3 (moderate): Secondary | ICD-10-CM | POA: Diagnosis not present

## 2017-03-26 DIAGNOSIS — I10 Essential (primary) hypertension: Secondary | ICD-10-CM | POA: Diagnosis not present

## 2017-03-26 DIAGNOSIS — F0281 Dementia in other diseases classified elsewhere with behavioral disturbance: Secondary | ICD-10-CM | POA: Diagnosis not present

## 2017-03-26 DIAGNOSIS — E785 Hyperlipidemia, unspecified: Secondary | ICD-10-CM | POA: Diagnosis not present

## 2017-03-27 DIAGNOSIS — E785 Hyperlipidemia, unspecified: Secondary | ICD-10-CM | POA: Diagnosis not present

## 2017-03-27 DIAGNOSIS — I679 Cerebrovascular disease, unspecified: Secondary | ICD-10-CM | POA: Diagnosis not present

## 2017-03-27 DIAGNOSIS — I10 Essential (primary) hypertension: Secondary | ICD-10-CM | POA: Diagnosis not present

## 2017-03-27 DIAGNOSIS — N183 Chronic kidney disease, stage 3 (moderate): Secondary | ICD-10-CM | POA: Diagnosis not present

## 2017-03-27 DIAGNOSIS — F0281 Dementia in other diseases classified elsewhere with behavioral disturbance: Secondary | ICD-10-CM | POA: Diagnosis not present

## 2017-03-27 DIAGNOSIS — G20 Parkinson's disease: Secondary | ICD-10-CM | POA: Diagnosis not present

## 2017-03-28 DIAGNOSIS — G20 Parkinson's disease: Secondary | ICD-10-CM | POA: Diagnosis not present

## 2017-03-28 DIAGNOSIS — E785 Hyperlipidemia, unspecified: Secondary | ICD-10-CM | POA: Diagnosis not present

## 2017-03-28 DIAGNOSIS — I10 Essential (primary) hypertension: Secondary | ICD-10-CM | POA: Diagnosis not present

## 2017-03-28 DIAGNOSIS — I679 Cerebrovascular disease, unspecified: Secondary | ICD-10-CM | POA: Diagnosis not present

## 2017-03-28 DIAGNOSIS — F0281 Dementia in other diseases classified elsewhere with behavioral disturbance: Secondary | ICD-10-CM | POA: Diagnosis not present

## 2017-03-28 DIAGNOSIS — N183 Chronic kidney disease, stage 3 (moderate): Secondary | ICD-10-CM | POA: Diagnosis not present

## 2017-03-30 DIAGNOSIS — E039 Hypothyroidism, unspecified: Secondary | ICD-10-CM | POA: Diagnosis not present

## 2017-03-30 DIAGNOSIS — N183 Chronic kidney disease, stage 3 (moderate): Secondary | ICD-10-CM | POA: Diagnosis not present

## 2017-03-30 DIAGNOSIS — R634 Abnormal weight loss: Secondary | ICD-10-CM | POA: Diagnosis not present

## 2017-03-30 DIAGNOSIS — J309 Allergic rhinitis, unspecified: Secondary | ICD-10-CM | POA: Diagnosis not present

## 2017-03-30 DIAGNOSIS — K219 Gastro-esophageal reflux disease without esophagitis: Secondary | ICD-10-CM | POA: Diagnosis not present

## 2017-03-30 DIAGNOSIS — F411 Generalized anxiety disorder: Secondary | ICD-10-CM | POA: Diagnosis not present

## 2017-03-30 DIAGNOSIS — E785 Hyperlipidemia, unspecified: Secondary | ICD-10-CM | POA: Diagnosis not present

## 2017-03-30 DIAGNOSIS — I679 Cerebrovascular disease, unspecified: Secondary | ICD-10-CM | POA: Diagnosis not present

## 2017-03-30 DIAGNOSIS — N390 Urinary tract infection, site not specified: Secondary | ICD-10-CM | POA: Diagnosis not present

## 2017-03-30 DIAGNOSIS — J45909 Unspecified asthma, uncomplicated: Secondary | ICD-10-CM | POA: Diagnosis not present

## 2017-03-30 DIAGNOSIS — G20 Parkinson's disease: Secondary | ICD-10-CM | POA: Diagnosis not present

## 2017-03-30 DIAGNOSIS — F339 Major depressive disorder, recurrent, unspecified: Secondary | ICD-10-CM | POA: Diagnosis not present

## 2017-03-30 DIAGNOSIS — F0281 Dementia in other diseases classified elsewhere with behavioral disturbance: Secondary | ICD-10-CM | POA: Diagnosis not present

## 2017-03-30 DIAGNOSIS — R1310 Dysphagia, unspecified: Secondary | ICD-10-CM | POA: Diagnosis not present

## 2017-03-30 DIAGNOSIS — I10 Essential (primary) hypertension: Secondary | ICD-10-CM | POA: Diagnosis not present

## 2017-04-18 DIAGNOSIS — N390 Urinary tract infection, site not specified: Secondary | ICD-10-CM | POA: Diagnosis not present

## 2017-04-22 ENCOUNTER — Encounter (HOSPITAL_COMMUNITY): Payer: Self-pay

## 2017-04-22 ENCOUNTER — Emergency Department (HOSPITAL_COMMUNITY)
Admission: EM | Admit: 2017-04-22 | Discharge: 2017-04-22 | Disposition: A | Discharge status: Skilled Nursing Facility | Attending: Emergency Medicine | Admitting: Emergency Medicine

## 2017-04-22 ENCOUNTER — Emergency Department (HOSPITAL_COMMUNITY)

## 2017-04-22 DIAGNOSIS — Y999 Unspecified external cause status: Secondary | ICD-10-CM | POA: Insufficient documentation

## 2017-04-22 DIAGNOSIS — N184 Chronic kidney disease, stage 4 (severe): Secondary | ICD-10-CM | POA: Diagnosis not present

## 2017-04-22 DIAGNOSIS — E039 Hypothyroidism, unspecified: Secondary | ICD-10-CM | POA: Diagnosis not present

## 2017-04-22 DIAGNOSIS — G20 Parkinson's disease: Secondary | ICD-10-CM | POA: Insufficient documentation

## 2017-04-22 DIAGNOSIS — Y9389 Activity, other specified: Secondary | ICD-10-CM | POA: Insufficient documentation

## 2017-04-22 DIAGNOSIS — Z8673 Personal history of transient ischemic attack (TIA), and cerebral infarction without residual deficits: Secondary | ICD-10-CM | POA: Insufficient documentation

## 2017-04-22 DIAGNOSIS — S0181XA Laceration without foreign body of other part of head, initial encounter: Secondary | ICD-10-CM | POA: Diagnosis not present

## 2017-04-22 DIAGNOSIS — I129 Hypertensive chronic kidney disease with stage 1 through stage 4 chronic kidney disease, or unspecified chronic kidney disease: Secondary | ICD-10-CM | POA: Insufficient documentation

## 2017-04-22 DIAGNOSIS — D649 Anemia, unspecified: Secondary | ICD-10-CM | POA: Diagnosis not present

## 2017-04-22 DIAGNOSIS — Y929 Unspecified place or not applicable: Secondary | ICD-10-CM | POA: Insufficient documentation

## 2017-04-22 DIAGNOSIS — Z79899 Other long term (current) drug therapy: Secondary | ICD-10-CM | POA: Insufficient documentation

## 2017-04-22 DIAGNOSIS — W228XXA Striking against or struck by other objects, initial encounter: Secondary | ICD-10-CM | POA: Diagnosis not present

## 2017-04-22 DIAGNOSIS — S0990XA Unspecified injury of head, initial encounter: Secondary | ICD-10-CM | POA: Diagnosis present

## 2017-04-22 DIAGNOSIS — W19XXXA Unspecified fall, initial encounter: Secondary | ICD-10-CM

## 2017-04-22 DIAGNOSIS — S098XXA Other specified injuries of head, initial encounter: Secondary | ICD-10-CM | POA: Diagnosis not present

## 2017-04-22 DIAGNOSIS — J449 Chronic obstructive pulmonary disease, unspecified: Secondary | ICD-10-CM | POA: Insufficient documentation

## 2017-04-22 DIAGNOSIS — J45909 Unspecified asthma, uncomplicated: Secondary | ICD-10-CM | POA: Insufficient documentation

## 2017-04-22 NOTE — ED Triage Notes (Signed)
Pt. Brought via EMS from [facility]. Pt. Fail in shower, witnessed by staff. Pt. Has laceration to left side of head. Nursing home put dressing on it. Pt. Is not on blood thinners per ems.

## 2017-04-22 NOTE — ED Provider Notes (Signed)
[facility] DEPT Provider Note   CSN: [id number] Arrival date & time: 04/22/17  1451     History   Chief Complaint Chief Complaint  Patient presents with  . Fall    HPI [name] is a 82 y.o. female w PMHx of COP, CVA, parkinson dz, pulmonary HTN, alzheimer's dementia, presenting to the ED via EMS from her nursing s/p mechanical fall. She is accompanied by a nursing home employee who provides her help regularly w ADLs; she reports pt had just finished bathing and was standing to dry off when she slipped and fell hitting her left forehead on the sink. It's reported that she did not lose consciousness. She states pt is at her mental baseline and acting normally. Pt localizes pain to head, where she has a laceration above left eye brow. She states it is "not a lot of pain." Pt denies neck pain, back pain, or any other pain/injuries. She is not on anticoagulation. It is reported that she requires a lot of assistance with standing and ambulation at baseline. Per chart review, Tdap up to date as of March 2018.  The history is provided by the patient and the nursing home.    Past Medical History:  Diagnosis Date  . Acute cystitis   . Anemia   . Anxiety   . Aortic stenosis, mild 11/18/2013  . Asthma   . Back pain   . Bronchitis, acute   . Carotid artery occlusion   . Cerebrovascular disease   . COPD (chronic obstructive pulmonary disease) ([place])   . Dementia   . Depression   . Diverticulosis of colon   . DJD (degenerative joint disease)   . Fall 07/05/2015  . GERD (gastroesophageal reflux disease)   . History of sudden visual loss   . Hyperlipidemia   . Hypertension   . Hypothyroidism   . Parkinson's disease ([place])   . Pneumonia 2015   hx of x 2   . Pulmonary hypertension, moderate to severe ([place]) 11/19/2014  . Shortness of breath    with exertion   . 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 Transient ischemic attack   . UTI (lower urinary tract infection)    hx of     Patient Active Problem List   Diagnosis Date Noted  . Protein-calorie malnutrition, severe 11/05/2016  . COPD (chronic obstructive pulmonary disease) ([place]) 11/04/2016  . Stroke (cerebrum) ([place]) 11/04/2016  . CKD (chronic kidney disease), stage III 11/04/2016  . Acute encephalopathy 10/04/2016  . UTI (lower urinary tract infection) 10/17/2015  . Dementia in Alzheimer's disease 10/17/2015  . Encephalopathy, metabolic 73/22/0254  . Contusion, hip 07/07/2015  . Falls frequently 07/07/2015  . Cervical radiculopathy 07/07/2015  . Anxiety and depression 07/07/2015  . Gait disturbance 07/07/2015  . Hyperglycemia 07/07/2015  . FTT (failure to thrive) in adult 07/07/2015  . Parkinson disease ([place])   . UTI (urinary tract infection) 07/05/2015  . Chronic neck pain 07/05/2015  . Debility 07/05/2015  . Fatigue 01/19/2015  . Dysuria 01/19/2015  . Chronic restrictive lung disease 01/04/2015  . DOE (dyspnea on exertion) 11/19/2014  . Pulmonary hypertension, moderate to severe ([place]) 11/19/2014  . Irregular heart beats 08/12/2014  . Aftercare following surgery of the circulatory system, [place] 04/19/2014  . Pain of left lower extremity-Knee 04/19/2014  . Weakness 02/11/2014  . Loss of weight 02/11/2014  . Valvular heart disease 01/19/2014  . Pulmonary hypertension ([place]) 01/19/2014  . CAP (community acquired pneumonia)  01/08/2014  . Asthma with acute exacerbation 01/06/2014  . Fall 01/06/2014  . Aortic stenosis, mild 11/18/2013  . Preventative health care 11/18/2013  . Anemia, unspecified 11/18/2013  . Tremor 09/21/2013  . Dyspnea 05/18/2013  . Physical deconditioning 05/18/2013  . OA (osteoarthritis) of knee 11/17/2012  . Hyperlipidemia 11/04/2012  . Occlusion and stenosis of carotid artery without mention of cerebral infarction 03/06/2012  . ASTHMA 10/09/2010  . Sudden visual loss 01/10/2009  . TRANSIENT ISCHEMIC ATTACK 01/10/2009  . CEREBROVASCULAR DISEASE 11/19/2007  .  BRONCHITIS, RECURRENT 11/19/2007  . DIVERTICULOSIS OF COLON 11/19/2007  . Hypothyroidism 08/15/2007  . Anxiety state 08/15/2007  . Depression 08/15/2007  . Essential hypertension 08/15/2007  . GERD 08/15/2007  . Osteoarthritis 08/15/2007  . BACK PAIN, LUMBAR 08/15/2007    Past Surgical History:  Procedure Laterality Date  . CARDIAC CATHETERIZATION  02/18/14  . CARDIOTHORACIC PROCEDURE  01/13/2009   Right   . COLECTOMY    . ENDARTERECTOMY     right carotid endartarectomy - 2010  . FOOT SURGERY     nerve cut between toes   . LEFT AND RIGHT HEART CATHETERIZATION WITH CORONARY ANGIOGRAM N/A 02/18/2014   Procedure: LEFT AND RIGHT HEART CATHETERIZATION WITH CORONARY ANGIOGRAM;  Surgeon: Larey Dresser, MD;  Location[facility] C[facility];  Service: Cardiovascular;  Laterality: N/A;  . REPLACEMENT TOTAL KNEE     right and left knee  . TEE WITHOUT CARDIOVERSION N/A 02/08/2014   Procedure: TRANSESOPHAGEAL ECHOCARDIOGRAM (TEE);  Surgeon: Larey Dresser, MD;  Location: [facility];  Service: Cardiovascular;  Laterality: N/A;  . TOTAL KNEE ARTHROPLASTY Left 11/17/2012   Procedure: TOTAL KNEE ARTHROPLASTY;  Surgeon: Gearlean Alf, MD;  Location: [facility];  Service: Orthopedics;  Laterality: Left;  . TUBAL LIGATION    . VAGINAL HYSTERECTOMY      OB History    No data available       Home Medications    Prior to Admission medications   Medication Sig Start Date End Date Taking? Authorizing Provider  acetaminophen (TYLENOL) 325 MG tablet Take 650 mg by mouth every 8 (eight) hours.   Yes [provider]  ADVAIR HFA 230-21 MCG/ACT inhaler Inhale 2 puffs into the lungs 2 (two) times daily. 11/23/14  Yes Biagio Borg, MD  aspirin 81 MG chewable tablet Chew 81 mg by mouth daily.   Yes [provider]  bisacodyl (DULCOLAX) 10 MG suppository Place 10 mg rectally daily as needed for moderate constipation.   Yes [provider]  carbidopa-levodopa (SINEMET IR) 25-100 MG tablet  Take 2 tablets by mouth 3 (three) times daily. for parkinson's disease   Yes [provider]  divalproex (DEPAKOTE) 500 MG DR tablet Take 500 mg by mouth 2 (two) times daily.    Yes [provider]  escitalopram (LEXAPRO) 20 MG tablet Take 20 mg by mouth daily.    Yes [provider]  gabapentin (NEURONTIN) 100 MG capsule Take 100 mg by mouth 2 (two) times daily.   Yes [provider]  levothyroxine (SYNTHROID) 88 MCG tablet Take 88 mcg by mouth daily at 6 (six) AM.    Yes [provider]  LORazepam (ATIVAN) 0.5 MG tablet Take 0.5 mg by mouth 2 (two) times daily.   Yes [provider]  omeprazole (PRILOSEC) 20 MG capsule Take 20 mg by mouth daily.   Yes [provider]  senna (SENOKOT) 8.6 MG tablet Take 1 tablet by mouth daily.   Yes [provider]    Family History Family History  Problem Relation Age of Onset  . Emphysema Father   . Heart disease Mother   . Hyperlipidemia Mother   . Hypertension Mother   . Coronary artery disease Sister   . Heart disease Sister   . Hypertension Sister   . Varicose Veins Sister   . AAA (abdominal aortic aneurysm) Sister   . Rheumatic fever Brother        x2  . Heart attack Brother     Social History Social History  Substance Use Topics  . Smoking status: Never Smoker  . Smokeless tobacco: Never Used  . Alcohol use No     Allergies   Relafen [nabumetone]; Lipitor [atorvastatin]; Other; and Penicillins   Review of Systems Review of Systems  Constitutional: Negative for activity change.  HENT: Negative for facial swelling.   Eyes: Negative for visual disturbance.  Cardiovascular: Negative for chest pain.  Gastrointestinal: Negative for nausea.  Musculoskeletal: Negative for back pain and neck pain.  Skin: Positive for wound.  Neurological: Positive for headaches. Negative for syncope.  Hematological: Does not bruise/bleed easily.  Psychiatric/Behavioral:  Negative for confusion.  All other systems reviewed and are negative.    Physical Exam Updated Vital Signs BP 129/66 (BP Location: Right Arm)   Pulse 72   Temp 98.5 F (36.9 C) (Oral)   Resp 18   SpO2 98%   Physical Exam  Constitutional: She appears well-developed and well-nourished. No distress.  Alert, answering questions appropriately, however not oriented to place. She is oriented to person. Not in distress  HENT:  Head: Normocephalic and atraumatic.  Mouth/Throat: Oropharynx is clear and moist.  0.5cm laceration to left forehead superior to eyebrow. Hemostasis achieved on evaluation. Nontender to palpation. No hematoma or crepitus.  Eyes: Pupils are equal, round, and reactive to light. Conjunctivae are normal.  EOM grossly normal  Neck: Normal range of motion. Neck supple.  Cardiovascular: Normal rate, regular rhythm, normal heart sounds and intact distal pulses.   Pulmonary/Chest: Effort normal and breath sounds normal. No respiratory distress. She has no wheezes. She has no rales.  Abdominal: Soft. Bowel sounds are normal. She exhibits no distension. There is no tenderness. There is no guarding.  Musculoskeletal:  No spinal or paraspinal tenderness. No bony step-offs, no gross deformities. Moving all extremities, no obvious injuries, no tenderness. Pelvis is stable. Hips w normal ROM. No ecchymosis, edema, or deformities.  Neurological: She is alert.  CNJ grossly intact. Equal grip strength BUE, with equal biceps flexion and triceps extension bilaterally.   Skin: Skin is warm.  Psychiatric: She has a normal mood and affect. Her behavior is normal.  Nursing note and vitals reviewed.    ED Treatments / Results  Labs (all labs ordered are listed, but only abnormal results are displayed) Labs Reviewed - No data to display  EKG  EKG Interpretation None       Radiology Ct Head Wo Contrast  Result Date: 04/22/2017 CLINICAL DATA:  Witnessed fall in nursing home  shower. LEFT head laceration. History of dementia, Parkinson's disease. EXAM: CT HEAD WITHOUT CONTRAST CT CERVICAL SPINE WITHOUT CONTRAST TECHNIQUE: Multidetector CT imaging of the head and cervical spine was performed following the standard protocol without intravenous contrast. Multiplanar CT image reconstructions of the cervical spine were also generated. COMPARISON:  CT HEAD and cervical spine March 12, 2017 FINDINGS: CT HEAD FINDINGS- moderately motion degraded examination. BRAIN: No intraparenchymal hemorrhage, mass effect nor midline shift. The ventricles and  sulci are normal for age. Patchy supratentorial white matter hypodensities within normal range for patient's age, though non-specific are most compatible with chronic small vessel ischemic disease. Old small LEFT cerebellar infarct. No acute large vascular territory infarcts. No abnormal extra-axial fluid collections. Basal cisterns are patent. VASCULAR: Mild calcific atherosclerosis of the carotid siphons. SKULL: No skull fracture. No significant scalp soft tissue swelling. SINUSES/ORBITS: The mastoid air-cells and included paranasal sinuses are well-aerated.The included ocular globes and orbital contents are non-suspicious. Enophthalmos. OTHER: Patient is edentulous. CT CERVICAL SPINE FINDINGS- moderately motion degraded examination. ALIGNMENT: Straightened lordosis. Minimal grade 1 C2-3 anterolisthesis. SKULL BASE AND VERTEBRAE: Cervical vertebral bodies and posterior elements are intact. Severe C3-4 through C6-7 degenerative discs with height loss, vacuum disc, endplate sclerosis and marginal spurring. C1-2 articulation maintained with moderate arthropathy. No destructive bony lesions. SOFT TISSUES AND SPINAL CANAL: Nonacute. Severe calcific atherosclerosis LEFT carotid bifurcation. Parathyroidectomy. DISC LEVELS: Moderate canal stenosis C5-6, mild at C3-4 and C4-5. Severe RIGHT C4-5 and LEFT C5-6 neural foraminal narrowing better demonstrated on  prior CT. UPPER CHEST:  Lung apices are clear. OTHER: None. IMPRESSION: CT HEAD: 1. No acute intracranial process on this moderately motion degraded examination. 2. Stable examination including mild-to-moderate chronic small vessel ischemic disease and old small LEFT cerebellar infarct. CT CERVICAL SPINE: 1. No acute fracture on this moderately motion degraded examination. 2. Moderate canal stenosis C5-6. Severe C4-5 and C5-6 neural foraminal narrowing. Electronically Signed   By: Elon Alas M.D.   On: 04/22/2017 16:14   Ct Cervical Spine Wo Contrast  Result Date: 04/22/2017 CLINICAL DATA:  Witnessed fall in nursing home shower. LEFT head laceration. History of dementia, Parkinson's disease. EXAM: CT HEAD WITHOUT CONTRAST CT CERVICAL SPINE WITHOUT CONTRAST TECHNIQUE: Multidetector CT imaging of the head and cervical spine was performed following the standard protocol without intravenous contrast. Multiplanar CT image reconstructions of the cervical spine were also generated. COMPARISON:  CT HEAD and cervical spine March 12, 2017 FINDINGS: CT HEAD FINDINGS- moderately motion degraded examination. BRAIN: No intraparenchymal hemorrhage, mass effect nor midline shift. The ventricles and sulci are normal for age. Patchy supratentorial white matter hypodensities within normal range for patient's age, though non-specific are most compatible with chronic small vessel ischemic disease. Old small LEFT cerebellar infarct. No acute large vascular territory infarcts. No abnormal extra-axial fluid collections. Basal cisterns are patent. VASCULAR: Mild calcific atherosclerosis of the carotid siphons. SKULL: No skull fracture. No significant scalp soft tissue swelling. SINUSES/ORBITS: The mastoid air-cells and included paranasal sinuses are well-aerated.The included ocular globes and orbital contents are non-suspicious. Enophthalmos. OTHER: Patient is edentulous. CT CERVICAL SPINE FINDINGS- moderately motion degraded  examination. ALIGNMENT: Straightened lordosis. Minimal grade 1 C2-3 anterolisthesis. SKULL BASE AND VERTEBRAE: Cervical vertebral bodies and posterior elements are intact. Severe C3-4 through C6-7 degenerative discs with height loss, vacuum disc, endplate sclerosis and marginal spurring. C1-2 articulation maintained with moderate arthropathy. No destructive bony lesions. SOFT TISSUES AND SPINAL CANAL: Nonacute. Severe calcific atherosclerosis LEFT carotid bifurcation. Parathyroidectomy. DISC LEVELS: Moderate canal stenosis C5-6, mild at C3-4 and C4-5. Severe RIGHT C4-5 and LEFT C5-6 neural foraminal narrowing better demonstrated on prior CT. UPPER CHEST:  Lung apices are clear. OTHER: None. IMPRESSION: CT HEAD: 1. No acute intracranial process on this moderately motion degraded examination. 2. Stable examination including mild-to-moderate chronic small vessel ischemic disease and old small LEFT cerebellar infarct. CT CERVICAL SPINE: 1. No acute fracture on this moderately motion degraded examination. 2. Moderate canal stenosis C5-6. Severe C4-5 and C5-6 neural  foraminal narrowing. Electronically Signed   By: Elon Alas M.D.   On: 04/22/2017 16:14    Procedures .Marland KitchenLaceration Repair Date/Time: 04/22/2017 5:39 PM Performed by: RUSSO, [place] N Authorized by: RUSSO, [place] N   Consent:    Consent obtained:  Verbal   Consent given by:  Healthcare agent and patient   Risks discussed:  Infection, poor cosmetic result and pain   Alternatives discussed:  No treatment Anesthesia (see MAR for exact dosages):    Anesthesia method:  None Laceration details:    Location:  Face   Face location:  Forehead   Length (cm):  0.5 Repair type:    Repair type:  Simple Exploration:    Hemostasis achieved with:  Direct pressure   Wound exploration: entire depth of wound probed and visualized     Wound extent: no foreign bodies/material noted     Contaminated: no   Treatment:    Area cleansed with:  Saline    Amount of cleaning:  Standard   Visualized foreign bodies/material removed: no   Skin repair:    Repair method:  Tissue adhesive Approximation:    Approximation:  Close   Vermilion border: well-aligned   Post-procedure details:    Dressing:  Adhesive bandage   Patient tolerance of procedure:  Tolerated well, no immediate complications   (including critical care time)  Medications Ordered in ED Medications - No data to display   Initial Impression / Assessment and Plan / ED Course  I have reviewed the triage vital signs and the nursing notes.  Pertinent labs & imaging results that were available during my care of the patient were reviewed by me and considered in my medical decision making (see chart for details).     Pt with laceration to left forehead s/p slip and fall in bath today. Not on anticoagulation. Pt at mental baseline per nursing home staff. Alert and answering questions appropriately. Reports minimal pain assoc w laceration. CT head and c-spine without acute intracranial pathology or fractures. No other injuries. Laceration irrigated and closed with dermabond. Tdap up to date. Pt without focal neuro deficits. Not in distress. Denies nausea, bad headache, vision changes. Pt is safe for discharge with PCP follow up and strict return precautions.   Patient discussed with and seen by Dr. Ellender Hose, who agrees with plan for discharge.  Discussed results, findings, treatment and follow up. Patient advised of return precautions. Patient verbalized understanding and agreed with plan.  Final Clinical Impressions(s) / ED Diagnoses   Final diagnoses:  Fall, initial encounter  Facial laceration, initial encounter    New Prescriptions New Prescriptions   No medications on file     Russo, [place] N, PA-C 04/22/17 1742    Duffy Bruce, MD 04/23/17 1015

## 2017-04-22 NOTE — Discharge Instructions (Signed)
Please read instructions below. Keep your wound clean. You can allow water to run over it in the shower. Do not scrub. Pat it dry. Do not apply ointment as this will break down the glue. The glue will fall off on it's own. Follow up with her primary care about her visit today. Return to the ER if the wound becomes more red and painful, if she develops a fever, begins complaining of severe headache, begins vomiting, acts confused, or for new or concerning symptoms.

## 2017-04-22 NOTE — ED Notes (Signed)
Pt returned from CT °

## 2017-04-22 NOTE — Progress Notes (Signed)
[facility] ED-[facility]osp[facility] of -[facility]-ED RN Visit @ 4PM  This patient is active with [facility] hospice services at [place].  Patient has an OOF DNR that is at bedside.  Visited patient with patient's sitter at side.  Patient alert and only oriented to person.  Laceration noted to left side of head and cervical collar in place.  Patient appears comfortable.  Patient returned from CT of head/cervical spine, where no acute fracture noted.  Spoke to RN, Cisco who states patient will likely discharge back to the facility after laceration repair.  Please do not hesitate to continue to contact [facility] with hospice related questions at [phone].  [facility] uses [facility] to transport their patients to home and facilities.   Thank you, Freddi Starr RN, [facility] Liaison [phone]

## 2017-04-24 DIAGNOSIS — N390 Urinary tract infection, site not specified: Secondary | ICD-10-CM | POA: Diagnosis not present

## 2017-04-29 DIAGNOSIS — F339 Major depressive disorder, recurrent, unspecified: Secondary | ICD-10-CM | POA: Diagnosis not present

## 2017-04-29 DIAGNOSIS — G20 Parkinson's disease: Secondary | ICD-10-CM | POA: Diagnosis not present

## 2017-04-29 DIAGNOSIS — F0281 Dementia in other diseases classified elsewhere with behavioral disturbance: Secondary | ICD-10-CM | POA: Diagnosis not present

## 2017-04-29 DIAGNOSIS — I10 Essential (primary) hypertension: Secondary | ICD-10-CM | POA: Diagnosis not present

## 2017-04-29 DIAGNOSIS — R634 Abnormal weight loss: Secondary | ICD-10-CM | POA: Diagnosis not present

## 2017-04-29 DIAGNOSIS — J309 Allergic rhinitis, unspecified: Secondary | ICD-10-CM | POA: Diagnosis not present

## 2017-04-29 DIAGNOSIS — E039 Hypothyroidism, unspecified: Secondary | ICD-10-CM | POA: Diagnosis not present

## 2017-04-29 DIAGNOSIS — N183 Chronic kidney disease, stage 3 (moderate): Secondary | ICD-10-CM | POA: Diagnosis not present

## 2017-04-29 DIAGNOSIS — R1310 Dysphagia, unspecified: Secondary | ICD-10-CM | POA: Diagnosis not present

## 2017-04-29 DIAGNOSIS — I679 Cerebrovascular disease, unspecified: Secondary | ICD-10-CM | POA: Diagnosis not present

## 2017-04-29 DIAGNOSIS — K219 Gastro-esophageal reflux disease without esophagitis: Secondary | ICD-10-CM | POA: Diagnosis not present

## 2017-04-29 DIAGNOSIS — J45909 Unspecified asthma, uncomplicated: Secondary | ICD-10-CM | POA: Diagnosis not present

## 2017-04-29 DIAGNOSIS — E785 Hyperlipidemia, unspecified: Secondary | ICD-10-CM | POA: Diagnosis not present

## 2017-04-29 DIAGNOSIS — F411 Generalized anxiety disorder: Secondary | ICD-10-CM | POA: Diagnosis not present

## 2017-04-29 DIAGNOSIS — N390 Urinary tract infection, site not specified: Secondary | ICD-10-CM | POA: Diagnosis not present

## 2017-04-30 DIAGNOSIS — E785 Hyperlipidemia, unspecified: Secondary | ICD-10-CM | POA: Diagnosis not present

## 2017-04-30 DIAGNOSIS — N183 Chronic kidney disease, stage 3 (moderate): Secondary | ICD-10-CM | POA: Diagnosis not present

## 2017-04-30 DIAGNOSIS — G20 Parkinson's disease: Secondary | ICD-10-CM | POA: Diagnosis not present

## 2017-04-30 DIAGNOSIS — I10 Essential (primary) hypertension: Secondary | ICD-10-CM | POA: Diagnosis not present

## 2017-04-30 DIAGNOSIS — F0281 Dementia in other diseases classified elsewhere with behavioral disturbance: Secondary | ICD-10-CM | POA: Diagnosis not present

## 2017-04-30 DIAGNOSIS — I679 Cerebrovascular disease, unspecified: Secondary | ICD-10-CM | POA: Diagnosis not present

## 2017-05-01 ENCOUNTER — Encounter: Payer: Self-pay | Admitting: Family

## 2017-05-01 ENCOUNTER — Ambulatory Visit (HOSPITAL_COMMUNITY)
Admission: RE | Admit: 2017-05-01 | Discharge: 2017-05-01 | Disposition: A | Discharge status: Home / Self Care | Source: Ambulatory Visit | Attending: Family | Admitting: Family

## 2017-05-01 ENCOUNTER — Ambulatory Visit (INDEPENDENT_AMBULATORY_CARE_PROVIDER_SITE_OTHER): Admitting: Family

## 2017-05-01 VITALS — BP 103/66 | HR 82 | Temp 98.0°F | Resp 20 | Ht 65.0 in | Wt 120.0 lb

## 2017-05-01 DIAGNOSIS — I6522 Occlusion and stenosis of left carotid artery: Secondary | ICD-10-CM | POA: Diagnosis not present

## 2017-05-01 DIAGNOSIS — G20 Parkinson's disease: Secondary | ICD-10-CM

## 2017-05-01 DIAGNOSIS — E785 Hyperlipidemia, unspecified: Secondary | ICD-10-CM | POA: Diagnosis not present

## 2017-05-01 DIAGNOSIS — N183 Chronic kidney disease, stage 3 (moderate): Secondary | ICD-10-CM | POA: Diagnosis not present

## 2017-05-01 DIAGNOSIS — Z9889 Other specified postprocedural states: Secondary | ICD-10-CM | POA: Insufficient documentation

## 2017-05-01 DIAGNOSIS — I6523 Occlusion and stenosis of bilateral carotid arteries: Secondary | ICD-10-CM | POA: Diagnosis present

## 2017-05-01 DIAGNOSIS — I10 Essential (primary) hypertension: Secondary | ICD-10-CM | POA: Diagnosis not present

## 2017-05-01 DIAGNOSIS — Z9181 History of falling: Secondary | ICD-10-CM | POA: Diagnosis not present

## 2017-05-01 DIAGNOSIS — F0281 Dementia in other diseases classified elsewhere with behavioral disturbance: Secondary | ICD-10-CM | POA: Diagnosis not present

## 2017-05-01 DIAGNOSIS — I679 Cerebrovascular disease, unspecified: Secondary | ICD-10-CM | POA: Diagnosis not present

## 2017-05-01 LAB — VAS US CAROTID
LCCAPSYS: 119 cm/s
LEFT ECA DIAS: -23 cm/s
LICAPDIAS: -18 cm/s
LICAPSYS: -82 cm/s
Left CCA dist dias: 14 cm/s
Left CCA dist sys: 58 cm/s
Left CCA prox dias: 18 cm/s
Left ICA dist dias: -16 cm/s
Left ICA dist sys: -65 cm/s
RCCAPSYS: -71 cm/s
RIGHT CCA MID DIAS: 13 cm/s
Right CCA prox dias: -16 cm/s
Right cca dist sys: -91 cm/s

## 2017-05-01 NOTE — Progress Notes (Signed)
Chief Complaint: Follow up Extracranial Carotid Artery Stenosis   History of Present Illness  [name] is a 82 y.o. female patient of Dr. Kellie Simmering who is status post right carotid endarterectomy in 2010. She returns today for follow up. Right before her right CEA, she had a right retinal embolus with monocular loss of vision, right arm weakness and trembling, both of the above symptoms remain. She denies right leg weakness, denies expressive aphasia at that time.  She had a TIA about 2012 or 2013 as manifested by confusion and expressive aphasia, denies any further hemiparesis, denies any further loss of vision, denies any further TIA or stroke activity. She denies claudication symptoms with walking. She has had asthma since childhood.  She was receiving cortisone injections in her neck for c-spine issues and neck pain; son states her surgical risk is too great for surgery on her c-spine. She was in pulmonary therapy. Her balance is off, has low energy. She states that she declined an offer by Dr. Maureen Ralphs to get balance therapy. Son states that she has had evaluation for 2 years to try to determine etiology of her dyspnea, states she has had a cardiac and pulmonary evaluation.   She is a resident of [facility] facility. She was evaluated in [name] ED in August and September 2018 after falling.  Pt and her son with her state pt will continue to reside at the nursing facility.  Pt states she is losing weight as she needs new dentures, not able to eat well. She has Parkinson's Disease which seems to have progressed since she was here a year ago, states she falls when she tries to walk. Pt denies any wounds or sores on her feet.    Pt Diabetic: No Pt smoker: non-smoker, but she had secondhand smoke exposure for 60 years from her husband. She worked at [employer], she ran a Management consultant.   Pt meds include: Statin : yes ASA: Yes Other anticoagulants/antiplatelets: no     Past Medical History:  Diagnosis Date  . Acute cystitis   . Anemia   . Anxiety   . Aortic stenosis, mild 11/18/2013  . Asthma   . Back pain   . Bronchitis, acute   . Carotid artery occlusion   . Cerebrovascular disease   . COPD (chronic obstructive pulmonary disease) ([place])   . Dementia   . Depression   . Diverticulosis of colon   . DJD (degenerative joint disease)   . Fall 07/05/2015  . GERD (gastroesophageal reflux disease)   . History of sudden visual loss   . Hyperlipidemia   . Hypertension   . Hypothyroidism   . Parkinson's disease ([place])   . Pneumonia 2015   hx of x 2   . Pulmonary hypertension, moderate to severe ([place]) 11/19/2014  . Shortness of breath    with exertion   . 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 Transient ischemic attack   . UTI (lower urinary tract infection)    hx of     Social History Social History  Substance Use Topics  . Smoking status: Never Smoker  . Smokeless tobacco: Never Used  . Alcohol use No    Family History Family History  Problem Relation Age of Onset  . Emphysema Father   . Heart disease Mother   . Hyperlipidemia Mother   . Hypertension Mother   . Coronary artery disease Sister   . Heart disease Sister   .  Hypertension Sister   . Varicose Veins Sister   . AAA (abdominal aortic aneurysm) Sister   . Rheumatic fever Brother        x2  . Heart attack Brother     Surgical History Past Surgical History:  Procedure Laterality Date  . CARDIAC CATHETERIZATION  02/18/14  . CARDIOTHORACIC PROCEDURE  01/13/2009   Right   . COLECTOMY    . ENDARTERECTOMY     right carotid endartarectomy - 2010  . FOOT SURGERY     nerve cut between toes   . LEFT AND RIGHT HEART CATHETERIZATION WITH CORONARY ANGIOGRAM N/A 02/18/2014   Procedure: LEFT AND RIGHT HEART CATHETERIZATION WITH CORONARY ANGIOGRAM;  Surgeon: Larey Dresser, MD;  Location: [facility] CATH LAB;  Service: Cardiovascular;  Laterality: N/A;  . REPLACEMENT TOTAL  KNEE     right and left knee  . TEE WITHOUT CARDIOVERSION N/A 02/08/2014   Procedure: TRANSESOPHAGEAL ECHOCARDIOGRAM (TEE);  Surgeon: Larey Dresser, MD;  Location: [facility];  Service: Cardiovascular;  Laterality: N/A;  . TOTAL KNEE ARTHROPLASTY Left 11/17/2012   Procedure: TOTAL KNEE ARTHROPLASTY;  Surgeon: Gearlean Alf, MD;  Location: [facility] ORS;  Service: Orthopedics;  Laterality: Left;  . TUBAL LIGATION    . VAGINAL HYSTERECTOMY      Allergies  Allergen Reactions  . Relafen [Nabumetone] Diarrhea and Other (See Comments)    GI / Urinary Bleeding  . Lipitor [Atorvastatin] Other (See Comments)    Causes memory loss  . Other Other (See Comments)    "orvail" unknown:  Causes Bloody stool  . Penicillins Hives, Itching, Swelling and Rash    Has patient had a PCN reaction causing immediate rash, facial/tongue/throat swelling, SOB or lightheadedness with hypotension: no Has patient had a PCN reaction causing severe rash involving mucus membranes or skin necrosis: unknown Has patient had a PCN reaction that required hospitalization no Has patient had a PCN reaction occurring within the last 10 years: no If all of the above answers are "NO", then may proceed with Cephalosporin use.     Current Outpatient Prescriptions  Medication Sig Dispense Refill  . acetaminophen (TYLENOL) 325 MG tablet Take 650 mg by mouth every 8 (eight) hours.    Marland Kitchen ADVAIR HFA 230-21 MCG/ACT inhaler Inhale 2 puffs into the lungs 2 (two) times daily. 1 Inhaler 3  . aspirin 81 MG chewable tablet Chew 81 mg by mouth daily.    . bisacodyl (DULCOLAX) 10 MG suppository Place 10 mg rectally daily as needed for moderate constipation.    . carbidopa-levodopa (SINEMET IR) 25-100 MG tablet Take 2 tablets by mouth 3 (three) times daily. for parkinson's disease    . divalproex (DEPAKOTE) 500 MG DR tablet Take 500 mg by mouth 2 (two) times daily.     Marland Kitchen escitalopram (LEXAPRO) 20 MG tablet Take 20 mg by mouth daily.     Marland Kitchen  gabapentin (NEURONTIN) 100 MG capsule Take 100 mg by mouth 2 (two) times daily.    Marland Kitchen levofloxacin (LEVAQUIN) 500 MG tablet Take 500 mg by mouth daily.    Marland Kitchen levothyroxine (SYNTHROID) 88 MCG tablet Take 88 mcg by mouth daily at 6 (six) AM.     . LORazepam (ATIVAN) 0.5 MG tablet Take 0.5 mg by mouth 2 (two) times daily.    Marland Kitchen omeprazole (PRILOSEC) 20 MG capsule Take 20 mg by mouth daily.    Marland Kitchen senna (SENOKOT) 8.6 MG tablet Take 1 tablet by mouth daily.     No current facility-administered medications  for this visit.     Review of Systems : See HPI for pertinent positives and negatives.  Physical Examination  Vitals:   05/01/17 1215 05/01/17 1218  BP: 109/70 103/66  Pulse: 82   Resp: 20   Temp: 98 F (36.7 C)   TempSrc: Oral   SpO2: 96%   Weight: 120 lb (54.4 kg)   Height: 5\' 5"  (1.651 m)    Body mass index is 19.97 kg/m.  General: WDWN thin elderly female in NAD GAIT:seated in wheelchair Eyes: PERRLA Pulmonary: Respirations are nonlabored, fair air movement in all fields, no rales, rhonchi, or wheezing.  Cardiac: regular rhythm and rate, no detected murmur.  VASCULAR EXAM Carotid Bruits Right Left   Negative Negative    Aorta is not palpable. Radial pulses are 2+ palpable and equal.      LE Pulses Right Left   POPLITEAL not palpable  not palpable   POSTERIOR TIBIAL  2+ palpable  2+ palpable    DORSALIS PEDIS  ANTERIOR TIBIAL not palpable  not palpable     Gastrointestinal: soft, nontender, BS WNL, no r/g,no masses palpated.  Musculoskeletal: Generalized mild muscle atrophy/wasting/deconditioning. M/S 4/5 throughout Extremities without ischemic changes. Hands and face are pale.  Neurologic: A&O X 3; restless affect, speech is edentulous, CN 2-12 intact, Pain and light touch intact in  extremities, Motor exam as listed above. Pt is in constant motion seated in w/c.     Assessment: [name] is a 82 y.o. female who isstatus post right carotid endarterectomy in 2010. Preceding her right CEA, she had a right retinal embolus with monocular loss of vision, right arm weakness and trembling, both of the above symptoms remain. She denies right leg weakness, denies expressive aphasia at that time.  She had a TIA about 2012 or 2013 as manifested by confusion and expressive aphasia, denies any further hemiparesis, denies any further loss of vision, denies any further TIA or stroke activity. She now resides in [place], has fallen several times, is losing weight, has Parkinson's Disease which seems to have progressed compared to a year ago when I saw her.   DATA Carotid Duplex (05/01/17): Technically difficult exam due to pt movement. Patent right carotid endarterectomy site with no restenosis. 1-39% left ICA stenosis. Right vertebral artery flow is antegrade, left is not visualized. Bilateral subclavian artery waveforms are triphasic.  No significant change in comparison to the exams on 04/19/2014, 04/19/15, and 04/24/16.    Plan:  Follow-up in 18 months with Carotid Duplex scan.   I discussed in depth with the patient the nature of atherosclerosis, and emphasized the importance of maximal medical management including strict control of blood pressure, blood glucose, and lipid levels, obtaining regular exercise, and continued cessation of smoking.  The patient is aware that without maximal medical management the underlying atherosclerotic disease process will progress, limiting the benefit of any interventions. The patient was given information about stroke prevention and what symptoms should prompt the patient to seek immediate medical care. Thank you for allowing [place] to participate in this patient's care.  Clemon Chambers, RN, MSN, FNP-C [facility] Office: [phone]  Clinic Physician: Scot Dock  05/01/17 12:36 PM

## 2017-05-01 NOTE — Patient Instructions (Signed)
Stroke Prevention Some medical conditions and behaviors are associated with an increased chance of having a stroke. You may prevent a stroke by making healthy choices and managing medical conditions. How can I reduce my risk of having a stroke?  Stay physically active. Get at least 30 minutes of activity on most or all days.  Do not smoke. It may also be helpful to avoid exposure to secondhand smoke.  Limit alcohol use. Moderate alcohol use is considered to be: ? No more than 2 drinks per day for men. ? No more than 1 drink per day for nonpregnant women.  Eat healthy foods. This involves: ? Eating 5 or more servings of fruits and vegetables a day. ? Making dietary changes that address high blood pressure (hypertension), high cholesterol, diabetes, or obesity.  Manage your cholesterol levels. ? Making food choices that are high in fiber and low in saturated fat, trans fat, and cholesterol may control cholesterol levels. ? Take any prescribed medicines to control cholesterol as directed by your health care provider.  Manage your diabetes. ? Controlling your carbohydrate and sugar intake is recommended to manage diabetes. ? Take any prescribed medicines to control diabetes as directed by your health care provider.  Control your hypertension. ? Making food choices that are low in salt (sodium), saturated fat, trans fat, and cholesterol is recommended to manage hypertension. ? Ask your health care provider if you need treatment to lower your blood pressure. Take any prescribed medicines to control hypertension as directed by your health care provider. ? If you are 18-39 years of age, have your blood pressure checked every 3-5 years. If you are 40 years of age or older, have your blood pressure checked every year.  Maintain a healthy weight. ? Reducing calorie intake and making food choices that are low in sodium, saturated fat, trans fat, and cholesterol are recommended to manage  weight.  Stop drug abuse.  Avoid taking birth control pills. ? Talk to your health care provider about the risks of taking birth control pills if you are over 35 years old, smoke, get migraines, or have ever had a blood clot.  Get evaluated for sleep disorders (sleep apnea). ? Talk to your health care provider about getting a sleep evaluation if you snore a lot or have excessive sleepiness.  Take medicines only as directed by your health care provider. ? For some people, aspirin or blood thinners (anticoagulants) are helpful in reducing the risk of forming abnormal blood clots that can lead to stroke. If you have the irregular heart rhythm of atrial fibrillation, you should be on a blood thinner unless there is a good reason you cannot take them. ? Understand all your medicine instructions.  Make sure that other conditions (such as anemia or atherosclerosis) are addressed. Get help right away if:  You have sudden weakness or numbness of the face, arm, or leg, especially on one side of the body.  Your face or eyelid droops to one side.  You have sudden confusion.  You have trouble speaking (aphasia) or understanding.  You have sudden trouble seeing in one or both eyes.  You have sudden trouble walking.  You have dizziness.  You have a loss of balance or coordination.  You have a sudden, severe headache with no known cause.  You have new chest pain or an irregular heartbeat. Any of these symptoms may represent a serious problem that is an emergency. Do not wait to see if the symptoms will go away.   Get medical help at once. Call your local emergency services (911 in [place] Do not drive yourself to the hospital. This information is not intended to replace advice given to you by your health care provider. Make sure you discuss any questions you have with your health care provider. Document Released: 08/23/2004 Document Revised: 12/22/2015 Document Reviewed: 01/16/2013 Elsevier  Interactive Patient Education  2017 Elsevier Inc.     Preventing Cerebrovascular Disease Arteries are blood vessels that carry blood that contains oxygen from the heart to all parts of the body. Cerebrovascular disease affects arteries that supply the brain. Any condition that blocks or disrupts blood flow to the brain can cause cerebrovascular disease. Brain cells that lose blood supply start to die within minutes (stroke). Stroke is the main danger of cerebrovascular disease. Atherosclerosis and high blood pressure are common causes of cerebrovascular disease. Atherosclerosis is narrowing and hardening of an artery that results when fat, cholesterol, calcium, or other substances (plaque) build up inside an artery. Plaque reduces blood flow through the artery. High blood pressure increases the risk of bleeding inside the brain. Making diet and lifestyle changes to prevent atherosclerosis and high blood pressure lowers your risk of cerebrovascular disease. What nutrition changes can be made?  Eat more fruits, vegetables, and whole grains.  Reduce how much saturated fat you eat. To do this, eat less red meat and fewer full-fat dairy products.  Eat healthy proteins instead of red meat. Healthy proteins include: ? Fish. Eat fish that contains heart-healthy omega-3 fatty acids, twice a week. Examples include salmon, albacore tuna, mackerel, and herring. ? Chicken. ? Nuts. ? Low-fat or nonfat yogurt.  Avoid processed meats, like bacon and lunchmeat.  Avoid foods that contain: ? A lot of sugar, such as sweets and drinks with added sugar. ? A lot of salt (sodium). Avoid adding extra salt to your food, as told by your health care provider. ? Trans fats, such as margarine and baked goods. Trans fats may be listed as "partially hydrogenated oils" on food labels.  Check food labels to see how much sodium, sugar, and trans fats are in foods.  Use vegetable oils that contain low amounts of  saturated fat, such as olive oil or canola oil. What lifestyle changes can be made?  Drink alcohol in moderation. This means no more than 1 drink a day for nonpregnant women and 2 drinks a day for men. One drink equals 12 oz of beer, 5 oz of wine, or 1 oz of hard liquor.  If you are overweight, ask your health care provider to recommend a weight-loss plan for you. Losing 5-10 lb (2.2-4.5 kg) can reduce your risk of diabetes, atherosclerosis, and high blood pressure.  Exercise for 30?60 minutes on most days, or as much as told by your health care provider. ? Do moderate-intensity exercise, such as brisk walking, bicycling, and water aerobics. Ask your health care provider which activities are safe for you.  Do not use any products that contain nicotine or tobacco, such as cigarettes and e-cigarettes. If you need help quitting, ask your health care provider. Why are these changes important? Making these changes lowers your risk of many diseases that can cause cerebrovascular disease and stroke. Stroke is a leading cause of death and disability. Making these changes also improves your overall health and quality of life. What can I do to lower my risk? The following factors make you more likely to develop cerebrovascular disease:  Being overweight.  Smoking.  Being physically inactive.    Eating a high-fat diet.  Having certain health conditions, such as: ? Diabetes. ? High blood pressure. ? Heart disease. ? Atherosclerosis. ? High cholesterol. ? Sickle cell disease.  Talk with your health care provider about your risk for cerebrovascular disease. Work with your health care provider to control diseases that you have that may contribute to cerebrovascular disease. Your health care provider may prescribe medicines to help prevent major causes of cerebrovascular disease. Where to find more information: Learn more about preventing cerebrovascular disease from:  National Heart, Lung, and  Blood Institute: www.nhlbi.nih.gov/health/health-topics/topics/stroke  Centers for Disease Control and Prevention: cdc.gov/stroke/about.htm  Summary  Cerebrovascular disease can lead to a stroke.  Atherosclerosis and high blood pressure are major causes of cerebrovascular disease.  Making diet and lifestyle changes can reduce your risk of cerebrovascular disease.  Work with your health care provider to get your risk factors under control to reduce your risk of cerebrovascular disease. This information is not intended to replace advice given to you by your health care provider. Make sure you discuss any questions you have with your health care provider. Document Released: 07/31/2015 Document Revised: 02/03/2016 Document Reviewed: 07/31/2015 Elsevier Interactive Patient Education  2018 Elsevier Inc.  

## 2017-05-02 DIAGNOSIS — N183 Chronic kidney disease, stage 3 (moderate): Secondary | ICD-10-CM | POA: Diagnosis not present

## 2017-05-02 DIAGNOSIS — F0281 Dementia in other diseases classified elsewhere with behavioral disturbance: Secondary | ICD-10-CM | POA: Diagnosis not present

## 2017-05-02 DIAGNOSIS — G20 Parkinson's disease: Secondary | ICD-10-CM | POA: Diagnosis not present

## 2017-05-02 DIAGNOSIS — E785 Hyperlipidemia, unspecified: Secondary | ICD-10-CM | POA: Diagnosis not present

## 2017-05-02 DIAGNOSIS — I679 Cerebrovascular disease, unspecified: Secondary | ICD-10-CM | POA: Diagnosis not present

## 2017-05-02 DIAGNOSIS — I10 Essential (primary) hypertension: Secondary | ICD-10-CM | POA: Diagnosis not present

## 2017-05-03 NOTE — Addendum Note (Signed)
Addended by: Lianne Cure A on: 05/03/2017 04:18 PM   Modules accepted: Orders

## 2017-05-06 DIAGNOSIS — I679 Cerebrovascular disease, unspecified: Secondary | ICD-10-CM | POA: Diagnosis not present

## 2017-05-06 DIAGNOSIS — I10 Essential (primary) hypertension: Secondary | ICD-10-CM | POA: Diagnosis not present

## 2017-05-06 DIAGNOSIS — N183 Chronic kidney disease, stage 3 (moderate): Secondary | ICD-10-CM | POA: Diagnosis not present

## 2017-05-06 DIAGNOSIS — F0281 Dementia in other diseases classified elsewhere with behavioral disturbance: Secondary | ICD-10-CM | POA: Diagnosis not present

## 2017-05-06 DIAGNOSIS — G20 Parkinson's disease: Secondary | ICD-10-CM | POA: Diagnosis not present

## 2017-05-06 DIAGNOSIS — E785 Hyperlipidemia, unspecified: Secondary | ICD-10-CM | POA: Diagnosis not present

## 2017-05-08 DIAGNOSIS — I10 Essential (primary) hypertension: Secondary | ICD-10-CM | POA: Diagnosis not present

## 2017-05-08 DIAGNOSIS — I679 Cerebrovascular disease, unspecified: Secondary | ICD-10-CM | POA: Diagnosis not present

## 2017-05-08 DIAGNOSIS — F0281 Dementia in other diseases classified elsewhere with behavioral disturbance: Secondary | ICD-10-CM | POA: Diagnosis not present

## 2017-05-08 DIAGNOSIS — G20 Parkinson's disease: Secondary | ICD-10-CM | POA: Diagnosis not present

## 2017-05-08 DIAGNOSIS — N183 Chronic kidney disease, stage 3 (moderate): Secondary | ICD-10-CM | POA: Diagnosis not present

## 2017-05-08 DIAGNOSIS — E785 Hyperlipidemia, unspecified: Secondary | ICD-10-CM | POA: Diagnosis not present

## 2017-05-10 DIAGNOSIS — I10 Essential (primary) hypertension: Secondary | ICD-10-CM | POA: Diagnosis not present

## 2017-05-10 DIAGNOSIS — N183 Chronic kidney disease, stage 3 (moderate): Secondary | ICD-10-CM | POA: Diagnosis not present

## 2017-05-10 DIAGNOSIS — G20 Parkinson's disease: Secondary | ICD-10-CM | POA: Diagnosis not present

## 2017-05-10 DIAGNOSIS — F0281 Dementia in other diseases classified elsewhere with behavioral disturbance: Secondary | ICD-10-CM | POA: Diagnosis not present

## 2017-05-10 DIAGNOSIS — I679 Cerebrovascular disease, unspecified: Secondary | ICD-10-CM | POA: Diagnosis not present

## 2017-05-10 DIAGNOSIS — E785 Hyperlipidemia, unspecified: Secondary | ICD-10-CM | POA: Diagnosis not present

## 2017-05-13 DIAGNOSIS — N183 Chronic kidney disease, stage 3 (moderate): Secondary | ICD-10-CM | POA: Diagnosis not present

## 2017-05-13 DIAGNOSIS — E785 Hyperlipidemia, unspecified: Secondary | ICD-10-CM | POA: Diagnosis not present

## 2017-05-13 DIAGNOSIS — G20 Parkinson's disease: Secondary | ICD-10-CM | POA: Diagnosis not present

## 2017-05-13 DIAGNOSIS — I679 Cerebrovascular disease, unspecified: Secondary | ICD-10-CM | POA: Diagnosis not present

## 2017-05-13 DIAGNOSIS — F0281 Dementia in other diseases classified elsewhere with behavioral disturbance: Secondary | ICD-10-CM | POA: Diagnosis not present

## 2017-05-13 DIAGNOSIS — I10 Essential (primary) hypertension: Secondary | ICD-10-CM | POA: Diagnosis not present

## 2017-05-14 DIAGNOSIS — I10 Essential (primary) hypertension: Secondary | ICD-10-CM | POA: Diagnosis not present

## 2017-05-14 DIAGNOSIS — I679 Cerebrovascular disease, unspecified: Secondary | ICD-10-CM | POA: Diagnosis not present

## 2017-05-14 DIAGNOSIS — G20 Parkinson's disease: Secondary | ICD-10-CM | POA: Diagnosis not present

## 2017-05-14 DIAGNOSIS — E785 Hyperlipidemia, unspecified: Secondary | ICD-10-CM | POA: Diagnosis not present

## 2017-05-14 DIAGNOSIS — N183 Chronic kidney disease, stage 3 (moderate): Secondary | ICD-10-CM | POA: Diagnosis not present

## 2017-05-14 DIAGNOSIS — F0281 Dementia in other diseases classified elsewhere with behavioral disturbance: Secondary | ICD-10-CM | POA: Diagnosis not present

## 2017-05-15 DIAGNOSIS — N183 Chronic kidney disease, stage 3 (moderate): Secondary | ICD-10-CM | POA: Diagnosis not present

## 2017-05-15 DIAGNOSIS — I10 Essential (primary) hypertension: Secondary | ICD-10-CM | POA: Diagnosis not present

## 2017-05-15 DIAGNOSIS — G20 Parkinson's disease: Secondary | ICD-10-CM | POA: Diagnosis not present

## 2017-05-15 DIAGNOSIS — F0281 Dementia in other diseases classified elsewhere with behavioral disturbance: Secondary | ICD-10-CM | POA: Diagnosis not present

## 2017-05-15 DIAGNOSIS — I679 Cerebrovascular disease, unspecified: Secondary | ICD-10-CM | POA: Diagnosis not present

## 2017-05-15 DIAGNOSIS — E785 Hyperlipidemia, unspecified: Secondary | ICD-10-CM | POA: Diagnosis not present

## 2017-05-16 DIAGNOSIS — I10 Essential (primary) hypertension: Secondary | ICD-10-CM | POA: Diagnosis not present

## 2017-05-16 DIAGNOSIS — F0281 Dementia in other diseases classified elsewhere with behavioral disturbance: Secondary | ICD-10-CM | POA: Diagnosis not present

## 2017-05-16 DIAGNOSIS — G20 Parkinson's disease: Secondary | ICD-10-CM | POA: Diagnosis not present

## 2017-05-16 DIAGNOSIS — E785 Hyperlipidemia, unspecified: Secondary | ICD-10-CM | POA: Diagnosis not present

## 2017-05-16 DIAGNOSIS — N183 Chronic kidney disease, stage 3 (moderate): Secondary | ICD-10-CM | POA: Diagnosis not present

## 2017-05-16 DIAGNOSIS — I679 Cerebrovascular disease, unspecified: Secondary | ICD-10-CM | POA: Diagnosis not present

## 2017-05-20 ENCOUNTER — Telehealth: Payer: Self-pay | Admitting: Neurology

## 2017-05-20 DIAGNOSIS — I679 Cerebrovascular disease, unspecified: Secondary | ICD-10-CM | POA: Diagnosis not present

## 2017-05-20 DIAGNOSIS — G20 Parkinson's disease: Secondary | ICD-10-CM | POA: Diagnosis not present

## 2017-05-20 DIAGNOSIS — F0281 Dementia in other diseases classified elsewhere with behavioral disturbance: Secondary | ICD-10-CM | POA: Diagnosis not present

## 2017-05-20 DIAGNOSIS — N183 Chronic kidney disease, stage 3 (moderate): Secondary | ICD-10-CM | POA: Diagnosis not present

## 2017-05-20 DIAGNOSIS — I10 Essential (primary) hypertension: Secondary | ICD-10-CM | POA: Diagnosis not present

## 2017-05-20 DIAGNOSIS — E785 Hyperlipidemia, unspecified: Secondary | ICD-10-CM | POA: Diagnosis not present

## 2017-05-20 NOTE — Telephone Encounter (Signed)
Called again and no answer. I called cell number and received phone number [phone] ext [phone].  Spoke with patient's daughter. She states that patient has been very combative the last 2-3 weeks. She states it is like her personality has completely changed. She is throwing stuff at her nurses and spitting at them.  She is being treated by hospice and they have given her medication that isn't helping. The daughter does not know what medicine she takes.   They did check for UTI and it was negative. She is currently at [facility] and also has 24/7 nursing care. Her daughter is going to speak with hospice to get [place] a copy of recent medicine list. She thinks that a lot of her dosages of medicines have changed. She is concerned that Levodopa might need to be decreased.   She will get the current medicine list and call back. She has not been seen since June in our office and doesn't have a follow up til January.

## 2017-05-20 NOTE — Telephone Encounter (Signed)
Tried to call daughter at number provider provided with no answer.

## 2017-05-20 NOTE — Telephone Encounter (Signed)
Pt's daughter called and said she has some questions regarding pt and would like a call back CB# [phone] [id number]

## 2017-05-22 DIAGNOSIS — I679 Cerebrovascular disease, unspecified: Secondary | ICD-10-CM | POA: Diagnosis not present

## 2017-05-22 DIAGNOSIS — N183 Chronic kidney disease, stage 3 (moderate): Secondary | ICD-10-CM | POA: Diagnosis not present

## 2017-05-22 DIAGNOSIS — E785 Hyperlipidemia, unspecified: Secondary | ICD-10-CM | POA: Diagnosis not present

## 2017-05-22 DIAGNOSIS — G20 Parkinson's disease: Secondary | ICD-10-CM | POA: Diagnosis not present

## 2017-05-22 DIAGNOSIS — F0281 Dementia in other diseases classified elsewhere with behavioral disturbance: Secondary | ICD-10-CM | POA: Diagnosis not present

## 2017-05-22 DIAGNOSIS — I10 Essential (primary) hypertension: Secondary | ICD-10-CM | POA: Diagnosis not present

## 2017-05-23 DIAGNOSIS — E785 Hyperlipidemia, unspecified: Secondary | ICD-10-CM | POA: Diagnosis not present

## 2017-05-23 DIAGNOSIS — F0281 Dementia in other diseases classified elsewhere with behavioral disturbance: Secondary | ICD-10-CM | POA: Diagnosis not present

## 2017-05-23 DIAGNOSIS — I679 Cerebrovascular disease, unspecified: Secondary | ICD-10-CM | POA: Diagnosis not present

## 2017-05-23 DIAGNOSIS — G20 Parkinson's disease: Secondary | ICD-10-CM | POA: Diagnosis not present

## 2017-05-23 DIAGNOSIS — I10 Essential (primary) hypertension: Secondary | ICD-10-CM | POA: Diagnosis not present

## 2017-05-23 DIAGNOSIS — N183 Chronic kidney disease, stage 3 (moderate): Secondary | ICD-10-CM | POA: Diagnosis not present

## 2017-05-27 DIAGNOSIS — I10 Essential (primary) hypertension: Secondary | ICD-10-CM | POA: Diagnosis not present

## 2017-05-27 DIAGNOSIS — F0281 Dementia in other diseases classified elsewhere with behavioral disturbance: Secondary | ICD-10-CM | POA: Diagnosis not present

## 2017-05-27 DIAGNOSIS — E785 Hyperlipidemia, unspecified: Secondary | ICD-10-CM | POA: Diagnosis not present

## 2017-05-27 DIAGNOSIS — I679 Cerebrovascular disease, unspecified: Secondary | ICD-10-CM | POA: Diagnosis not present

## 2017-05-27 DIAGNOSIS — N183 Chronic kidney disease, stage 3 (moderate): Secondary | ICD-10-CM | POA: Diagnosis not present

## 2017-05-27 DIAGNOSIS — G20 Parkinson's disease: Secondary | ICD-10-CM | POA: Diagnosis not present

## 2017-05-28 DIAGNOSIS — F0281 Dementia in other diseases classified elsewhere with behavioral disturbance: Secondary | ICD-10-CM | POA: Diagnosis not present

## 2017-05-28 DIAGNOSIS — I679 Cerebrovascular disease, unspecified: Secondary | ICD-10-CM | POA: Diagnosis not present

## 2017-05-28 DIAGNOSIS — E785 Hyperlipidemia, unspecified: Secondary | ICD-10-CM | POA: Diagnosis not present

## 2017-05-28 DIAGNOSIS — I10 Essential (primary) hypertension: Secondary | ICD-10-CM | POA: Diagnosis not present

## 2017-05-28 DIAGNOSIS — N183 Chronic kidney disease, stage 3 (moderate): Secondary | ICD-10-CM | POA: Diagnosis not present

## 2017-05-28 DIAGNOSIS — G20 Parkinson's disease: Secondary | ICD-10-CM | POA: Diagnosis not present

## 2017-05-29 DIAGNOSIS — N183 Chronic kidney disease, stage 3 (moderate): Secondary | ICD-10-CM | POA: Diagnosis not present

## 2017-05-29 DIAGNOSIS — E785 Hyperlipidemia, unspecified: Secondary | ICD-10-CM | POA: Diagnosis not present

## 2017-05-29 DIAGNOSIS — F0281 Dementia in other diseases classified elsewhere with behavioral disturbance: Secondary | ICD-10-CM | POA: Diagnosis not present

## 2017-05-29 DIAGNOSIS — G20 Parkinson's disease: Secondary | ICD-10-CM | POA: Diagnosis not present

## 2017-05-29 DIAGNOSIS — I679 Cerebrovascular disease, unspecified: Secondary | ICD-10-CM | POA: Diagnosis not present

## 2017-05-29 DIAGNOSIS — I10 Essential (primary) hypertension: Secondary | ICD-10-CM | POA: Diagnosis not present

## 2017-05-30 DIAGNOSIS — N390 Urinary tract infection, site not specified: Secondary | ICD-10-CM | POA: Diagnosis not present

## 2017-05-30 DIAGNOSIS — J45909 Unspecified asthma, uncomplicated: Secondary | ICD-10-CM | POA: Diagnosis not present

## 2017-05-30 DIAGNOSIS — I10 Essential (primary) hypertension: Secondary | ICD-10-CM | POA: Diagnosis not present

## 2017-05-30 DIAGNOSIS — I679 Cerebrovascular disease, unspecified: Secondary | ICD-10-CM | POA: Diagnosis not present

## 2017-05-30 DIAGNOSIS — G20 Parkinson's disease: Secondary | ICD-10-CM | POA: Diagnosis not present

## 2017-05-30 DIAGNOSIS — F411 Generalized anxiety disorder: Secondary | ICD-10-CM | POA: Diagnosis not present

## 2017-05-30 DIAGNOSIS — J309 Allergic rhinitis, unspecified: Secondary | ICD-10-CM | POA: Diagnosis not present

## 2017-05-30 DIAGNOSIS — E039 Hypothyroidism, unspecified: Secondary | ICD-10-CM | POA: Diagnosis not present

## 2017-05-30 DIAGNOSIS — R1310 Dysphagia, unspecified: Secondary | ICD-10-CM | POA: Diagnosis not present

## 2017-05-30 DIAGNOSIS — F339 Major depressive disorder, recurrent, unspecified: Secondary | ICD-10-CM | POA: Diagnosis not present

## 2017-05-30 DIAGNOSIS — E785 Hyperlipidemia, unspecified: Secondary | ICD-10-CM | POA: Diagnosis not present

## 2017-05-30 DIAGNOSIS — N183 Chronic kidney disease, stage 3 (moderate): Secondary | ICD-10-CM | POA: Diagnosis not present

## 2017-05-30 DIAGNOSIS — F0281 Dementia in other diseases classified elsewhere with behavioral disturbance: Secondary | ICD-10-CM | POA: Diagnosis not present

## 2017-05-30 DIAGNOSIS — K219 Gastro-esophageal reflux disease without esophagitis: Secondary | ICD-10-CM | POA: Diagnosis not present

## 2017-05-30 DIAGNOSIS — R634 Abnormal weight loss: Secondary | ICD-10-CM | POA: Diagnosis not present

## 2017-06-03 DIAGNOSIS — F0281 Dementia in other diseases classified elsewhere with behavioral disturbance: Secondary | ICD-10-CM | POA: Diagnosis not present

## 2017-06-03 DIAGNOSIS — G20 Parkinson's disease: Secondary | ICD-10-CM | POA: Diagnosis not present

## 2017-06-03 DIAGNOSIS — I679 Cerebrovascular disease, unspecified: Secondary | ICD-10-CM | POA: Diagnosis not present

## 2017-06-03 DIAGNOSIS — E785 Hyperlipidemia, unspecified: Secondary | ICD-10-CM | POA: Diagnosis not present

## 2017-06-03 DIAGNOSIS — N183 Chronic kidney disease, stage 3 (moderate): Secondary | ICD-10-CM | POA: Diagnosis not present

## 2017-06-03 DIAGNOSIS — I10 Essential (primary) hypertension: Secondary | ICD-10-CM | POA: Diagnosis not present

## 2017-06-04 DIAGNOSIS — I679 Cerebrovascular disease, unspecified: Secondary | ICD-10-CM | POA: Diagnosis not present

## 2017-06-04 DIAGNOSIS — E785 Hyperlipidemia, unspecified: Secondary | ICD-10-CM | POA: Diagnosis not present

## 2017-06-04 DIAGNOSIS — I10 Essential (primary) hypertension: Secondary | ICD-10-CM | POA: Diagnosis not present

## 2017-06-04 DIAGNOSIS — N183 Chronic kidney disease, stage 3 (moderate): Secondary | ICD-10-CM | POA: Diagnosis not present

## 2017-06-04 DIAGNOSIS — F0281 Dementia in other diseases classified elsewhere with behavioral disturbance: Secondary | ICD-10-CM | POA: Diagnosis not present

## 2017-06-04 DIAGNOSIS — G20 Parkinson's disease: Secondary | ICD-10-CM | POA: Diagnosis not present

## 2017-06-05 DIAGNOSIS — F0281 Dementia in other diseases classified elsewhere with behavioral disturbance: Secondary | ICD-10-CM | POA: Diagnosis not present

## 2017-06-05 DIAGNOSIS — G20 Parkinson's disease: Secondary | ICD-10-CM | POA: Diagnosis not present

## 2017-06-05 DIAGNOSIS — I10 Essential (primary) hypertension: Secondary | ICD-10-CM | POA: Diagnosis not present

## 2017-06-05 DIAGNOSIS — E785 Hyperlipidemia, unspecified: Secondary | ICD-10-CM | POA: Diagnosis not present

## 2017-06-05 DIAGNOSIS — I679 Cerebrovascular disease, unspecified: Secondary | ICD-10-CM | POA: Diagnosis not present

## 2017-06-05 DIAGNOSIS — N183 Chronic kidney disease, stage 3 (moderate): Secondary | ICD-10-CM | POA: Diagnosis not present

## 2017-06-07 DIAGNOSIS — N183 Chronic kidney disease, stage 3 (moderate): Secondary | ICD-10-CM | POA: Diagnosis not present

## 2017-06-07 DIAGNOSIS — I679 Cerebrovascular disease, unspecified: Secondary | ICD-10-CM | POA: Diagnosis not present

## 2017-06-07 DIAGNOSIS — G20 Parkinson's disease: Secondary | ICD-10-CM | POA: Diagnosis not present

## 2017-06-07 DIAGNOSIS — N390 Urinary tract infection, site not specified: Secondary | ICD-10-CM | POA: Diagnosis not present

## 2017-06-07 DIAGNOSIS — E785 Hyperlipidemia, unspecified: Secondary | ICD-10-CM | POA: Diagnosis not present

## 2017-06-07 DIAGNOSIS — I10 Essential (primary) hypertension: Secondary | ICD-10-CM | POA: Diagnosis not present

## 2017-06-07 DIAGNOSIS — F0281 Dementia in other diseases classified elsewhere with behavioral disturbance: Secondary | ICD-10-CM | POA: Diagnosis not present

## 2017-06-10 DIAGNOSIS — I10 Essential (primary) hypertension: Secondary | ICD-10-CM | POA: Diagnosis not present

## 2017-06-10 DIAGNOSIS — E785 Hyperlipidemia, unspecified: Secondary | ICD-10-CM | POA: Diagnosis not present

## 2017-06-10 DIAGNOSIS — N183 Chronic kidney disease, stage 3 (moderate): Secondary | ICD-10-CM | POA: Diagnosis not present

## 2017-06-10 DIAGNOSIS — G20 Parkinson's disease: Secondary | ICD-10-CM | POA: Diagnosis not present

## 2017-06-10 DIAGNOSIS — F0281 Dementia in other diseases classified elsewhere with behavioral disturbance: Secondary | ICD-10-CM | POA: Diagnosis not present

## 2017-06-10 DIAGNOSIS — I679 Cerebrovascular disease, unspecified: Secondary | ICD-10-CM | POA: Diagnosis not present

## 2017-06-11 DIAGNOSIS — I679 Cerebrovascular disease, unspecified: Secondary | ICD-10-CM | POA: Diagnosis not present

## 2017-06-11 DIAGNOSIS — I10 Essential (primary) hypertension: Secondary | ICD-10-CM | POA: Diagnosis not present

## 2017-06-11 DIAGNOSIS — G20 Parkinson's disease: Secondary | ICD-10-CM | POA: Diagnosis not present

## 2017-06-11 DIAGNOSIS — N183 Chronic kidney disease, stage 3 (moderate): Secondary | ICD-10-CM | POA: Diagnosis not present

## 2017-06-11 DIAGNOSIS — F0281 Dementia in other diseases classified elsewhere with behavioral disturbance: Secondary | ICD-10-CM | POA: Diagnosis not present

## 2017-06-11 DIAGNOSIS — E785 Hyperlipidemia, unspecified: Secondary | ICD-10-CM | POA: Diagnosis not present

## 2017-06-12 DIAGNOSIS — I10 Essential (primary) hypertension: Secondary | ICD-10-CM | POA: Diagnosis not present

## 2017-06-12 DIAGNOSIS — G20 Parkinson's disease: Secondary | ICD-10-CM | POA: Diagnosis not present

## 2017-06-12 DIAGNOSIS — F0281 Dementia in other diseases classified elsewhere with behavioral disturbance: Secondary | ICD-10-CM | POA: Diagnosis not present

## 2017-06-12 DIAGNOSIS — E785 Hyperlipidemia, unspecified: Secondary | ICD-10-CM | POA: Diagnosis not present

## 2017-06-12 DIAGNOSIS — I679 Cerebrovascular disease, unspecified: Secondary | ICD-10-CM | POA: Diagnosis not present

## 2017-06-12 DIAGNOSIS — N183 Chronic kidney disease, stage 3 (moderate): Secondary | ICD-10-CM | POA: Diagnosis not present

## 2017-06-17 DIAGNOSIS — G20 Parkinson's disease: Secondary | ICD-10-CM | POA: Diagnosis not present

## 2017-06-17 DIAGNOSIS — I679 Cerebrovascular disease, unspecified: Secondary | ICD-10-CM | POA: Diagnosis not present

## 2017-06-17 DIAGNOSIS — I10 Essential (primary) hypertension: Secondary | ICD-10-CM | POA: Diagnosis not present

## 2017-06-17 DIAGNOSIS — E785 Hyperlipidemia, unspecified: Secondary | ICD-10-CM | POA: Diagnosis not present

## 2017-06-17 DIAGNOSIS — F0281 Dementia in other diseases classified elsewhere with behavioral disturbance: Secondary | ICD-10-CM | POA: Diagnosis not present

## 2017-06-17 DIAGNOSIS — N183 Chronic kidney disease, stage 3 (moderate): Secondary | ICD-10-CM | POA: Diagnosis not present

## 2017-06-18 DIAGNOSIS — I679 Cerebrovascular disease, unspecified: Secondary | ICD-10-CM | POA: Diagnosis not present

## 2017-06-18 DIAGNOSIS — G20 Parkinson's disease: Secondary | ICD-10-CM | POA: Diagnosis not present

## 2017-06-18 DIAGNOSIS — F0281 Dementia in other diseases classified elsewhere with behavioral disturbance: Secondary | ICD-10-CM | POA: Diagnosis not present

## 2017-06-18 DIAGNOSIS — E785 Hyperlipidemia, unspecified: Secondary | ICD-10-CM | POA: Diagnosis not present

## 2017-06-18 DIAGNOSIS — I10 Essential (primary) hypertension: Secondary | ICD-10-CM | POA: Diagnosis not present

## 2017-06-18 DIAGNOSIS — N183 Chronic kidney disease, stage 3 (moderate): Secondary | ICD-10-CM | POA: Diagnosis not present

## 2017-06-19 DIAGNOSIS — I10 Essential (primary) hypertension: Secondary | ICD-10-CM | POA: Diagnosis not present

## 2017-06-19 DIAGNOSIS — G20 Parkinson's disease: Secondary | ICD-10-CM | POA: Diagnosis not present

## 2017-06-19 DIAGNOSIS — E785 Hyperlipidemia, unspecified: Secondary | ICD-10-CM | POA: Diagnosis not present

## 2017-06-19 DIAGNOSIS — I679 Cerebrovascular disease, unspecified: Secondary | ICD-10-CM | POA: Diagnosis not present

## 2017-06-19 DIAGNOSIS — N183 Chronic kidney disease, stage 3 (moderate): Secondary | ICD-10-CM | POA: Diagnosis not present

## 2017-06-19 DIAGNOSIS — F0281 Dementia in other diseases classified elsewhere with behavioral disturbance: Secondary | ICD-10-CM | POA: Diagnosis not present

## 2017-06-25 DIAGNOSIS — I679 Cerebrovascular disease, unspecified: Secondary | ICD-10-CM | POA: Diagnosis not present

## 2017-06-25 DIAGNOSIS — I10 Essential (primary) hypertension: Secondary | ICD-10-CM | POA: Diagnosis not present

## 2017-06-25 DIAGNOSIS — G20 Parkinson's disease: Secondary | ICD-10-CM | POA: Diagnosis not present

## 2017-06-25 DIAGNOSIS — E785 Hyperlipidemia, unspecified: Secondary | ICD-10-CM | POA: Diagnosis not present

## 2017-06-25 DIAGNOSIS — N183 Chronic kidney disease, stage 3 (moderate): Secondary | ICD-10-CM | POA: Diagnosis not present

## 2017-06-25 DIAGNOSIS — F0281 Dementia in other diseases classified elsewhere with behavioral disturbance: Secondary | ICD-10-CM | POA: Diagnosis not present

## 2017-06-26 DIAGNOSIS — G20 Parkinson's disease: Secondary | ICD-10-CM | POA: Diagnosis not present

## 2017-06-26 DIAGNOSIS — F0281 Dementia in other diseases classified elsewhere with behavioral disturbance: Secondary | ICD-10-CM | POA: Diagnosis not present

## 2017-06-26 DIAGNOSIS — N183 Chronic kidney disease, stage 3 (moderate): Secondary | ICD-10-CM | POA: Diagnosis not present

## 2017-06-26 DIAGNOSIS — E785 Hyperlipidemia, unspecified: Secondary | ICD-10-CM | POA: Diagnosis not present

## 2017-06-26 DIAGNOSIS — I679 Cerebrovascular disease, unspecified: Secondary | ICD-10-CM | POA: Diagnosis not present

## 2017-06-26 DIAGNOSIS — I10 Essential (primary) hypertension: Secondary | ICD-10-CM | POA: Diagnosis not present

## 2017-06-27 DIAGNOSIS — I679 Cerebrovascular disease, unspecified: Secondary | ICD-10-CM | POA: Diagnosis not present

## 2017-06-27 DIAGNOSIS — I10 Essential (primary) hypertension: Secondary | ICD-10-CM | POA: Diagnosis not present

## 2017-06-27 DIAGNOSIS — F0281 Dementia in other diseases classified elsewhere with behavioral disturbance: Secondary | ICD-10-CM | POA: Diagnosis not present

## 2017-06-27 DIAGNOSIS — N183 Chronic kidney disease, stage 3 (moderate): Secondary | ICD-10-CM | POA: Diagnosis not present

## 2017-06-27 DIAGNOSIS — E785 Hyperlipidemia, unspecified: Secondary | ICD-10-CM | POA: Diagnosis not present

## 2017-06-27 DIAGNOSIS — G20 Parkinson's disease: Secondary | ICD-10-CM | POA: Diagnosis not present

## 2017-06-28 DIAGNOSIS — E785 Hyperlipidemia, unspecified: Secondary | ICD-10-CM | POA: Diagnosis not present

## 2017-06-28 DIAGNOSIS — N183 Chronic kidney disease, stage 3 (moderate): Secondary | ICD-10-CM | POA: Diagnosis not present

## 2017-06-28 DIAGNOSIS — G20 Parkinson's disease: Secondary | ICD-10-CM | POA: Diagnosis not present

## 2017-06-28 DIAGNOSIS — F0281 Dementia in other diseases classified elsewhere with behavioral disturbance: Secondary | ICD-10-CM | POA: Diagnosis not present

## 2017-06-28 DIAGNOSIS — I679 Cerebrovascular disease, unspecified: Secondary | ICD-10-CM | POA: Diagnosis not present

## 2017-06-28 DIAGNOSIS — I10 Essential (primary) hypertension: Secondary | ICD-10-CM | POA: Diagnosis not present

## 2017-06-29 DIAGNOSIS — R1310 Dysphagia, unspecified: Secondary | ICD-10-CM | POA: Diagnosis not present

## 2017-06-29 DIAGNOSIS — N183 Chronic kidney disease, stage 3 (moderate): Secondary | ICD-10-CM | POA: Diagnosis not present

## 2017-06-29 DIAGNOSIS — R634 Abnormal weight loss: Secondary | ICD-10-CM | POA: Diagnosis not present

## 2017-06-29 DIAGNOSIS — G20 Parkinson's disease: Secondary | ICD-10-CM | POA: Diagnosis not present

## 2017-06-29 DIAGNOSIS — F0281 Dementia in other diseases classified elsewhere with behavioral disturbance: Secondary | ICD-10-CM | POA: Diagnosis not present

## 2017-06-29 DIAGNOSIS — E039 Hypothyroidism, unspecified: Secondary | ICD-10-CM | POA: Diagnosis not present

## 2017-06-29 DIAGNOSIS — E785 Hyperlipidemia, unspecified: Secondary | ICD-10-CM | POA: Diagnosis not present

## 2017-06-29 DIAGNOSIS — I679 Cerebrovascular disease, unspecified: Secondary | ICD-10-CM | POA: Diagnosis not present

## 2017-06-29 DIAGNOSIS — F339 Major depressive disorder, recurrent, unspecified: Secondary | ICD-10-CM | POA: Diagnosis not present

## 2017-06-29 DIAGNOSIS — F411 Generalized anxiety disorder: Secondary | ICD-10-CM | POA: Diagnosis not present

## 2017-06-29 DIAGNOSIS — J45909 Unspecified asthma, uncomplicated: Secondary | ICD-10-CM | POA: Diagnosis not present

## 2017-06-29 DIAGNOSIS — J309 Allergic rhinitis, unspecified: Secondary | ICD-10-CM | POA: Diagnosis not present

## 2017-06-29 DIAGNOSIS — N390 Urinary tract infection, site not specified: Secondary | ICD-10-CM | POA: Diagnosis not present

## 2017-06-29 DIAGNOSIS — I10 Essential (primary) hypertension: Secondary | ICD-10-CM | POA: Diagnosis not present

## 2017-06-29 DIAGNOSIS — K219 Gastro-esophageal reflux disease without esophagitis: Secondary | ICD-10-CM | POA: Diagnosis not present

## 2017-07-02 DIAGNOSIS — N183 Chronic kidney disease, stage 3 (moderate): Secondary | ICD-10-CM | POA: Diagnosis not present

## 2017-07-02 DIAGNOSIS — E785 Hyperlipidemia, unspecified: Secondary | ICD-10-CM | POA: Diagnosis not present

## 2017-07-02 DIAGNOSIS — G20 Parkinson's disease: Secondary | ICD-10-CM | POA: Diagnosis not present

## 2017-07-02 DIAGNOSIS — I679 Cerebrovascular disease, unspecified: Secondary | ICD-10-CM | POA: Diagnosis not present

## 2017-07-02 DIAGNOSIS — I10 Essential (primary) hypertension: Secondary | ICD-10-CM | POA: Diagnosis not present

## 2017-07-02 DIAGNOSIS — F0281 Dementia in other diseases classified elsewhere with behavioral disturbance: Secondary | ICD-10-CM | POA: Diagnosis not present

## 2017-07-04 DIAGNOSIS — F0281 Dementia in other diseases classified elsewhere with behavioral disturbance: Secondary | ICD-10-CM | POA: Diagnosis not present

## 2017-07-04 DIAGNOSIS — G20 Parkinson's disease: Secondary | ICD-10-CM | POA: Diagnosis not present

## 2017-07-04 DIAGNOSIS — N183 Chronic kidney disease, stage 3 (moderate): Secondary | ICD-10-CM | POA: Diagnosis not present

## 2017-07-04 DIAGNOSIS — I10 Essential (primary) hypertension: Secondary | ICD-10-CM | POA: Diagnosis not present

## 2017-07-04 DIAGNOSIS — I679 Cerebrovascular disease, unspecified: Secondary | ICD-10-CM | POA: Diagnosis not present

## 2017-07-04 DIAGNOSIS — E785 Hyperlipidemia, unspecified: Secondary | ICD-10-CM | POA: Diagnosis not present

## 2017-07-05 DIAGNOSIS — N183 Chronic kidney disease, stage 3 (moderate): Secondary | ICD-10-CM | POA: Diagnosis not present

## 2017-07-05 DIAGNOSIS — G20 Parkinson's disease: Secondary | ICD-10-CM | POA: Diagnosis not present

## 2017-07-05 DIAGNOSIS — E785 Hyperlipidemia, unspecified: Secondary | ICD-10-CM | POA: Diagnosis not present

## 2017-07-05 DIAGNOSIS — I679 Cerebrovascular disease, unspecified: Secondary | ICD-10-CM | POA: Diagnosis not present

## 2017-07-05 DIAGNOSIS — F0281 Dementia in other diseases classified elsewhere with behavioral disturbance: Secondary | ICD-10-CM | POA: Diagnosis not present

## 2017-07-05 DIAGNOSIS — I10 Essential (primary) hypertension: Secondary | ICD-10-CM | POA: Diagnosis not present

## 2017-07-10 DIAGNOSIS — I679 Cerebrovascular disease, unspecified: Secondary | ICD-10-CM | POA: Diagnosis not present

## 2017-07-10 DIAGNOSIS — E785 Hyperlipidemia, unspecified: Secondary | ICD-10-CM | POA: Diagnosis not present

## 2017-07-10 DIAGNOSIS — G20 Parkinson's disease: Secondary | ICD-10-CM | POA: Diagnosis not present

## 2017-07-10 DIAGNOSIS — N183 Chronic kidney disease, stage 3 (moderate): Secondary | ICD-10-CM | POA: Diagnosis not present

## 2017-07-10 DIAGNOSIS — F0281 Dementia in other diseases classified elsewhere with behavioral disturbance: Secondary | ICD-10-CM | POA: Diagnosis not present

## 2017-07-10 DIAGNOSIS — I10 Essential (primary) hypertension: Secondary | ICD-10-CM | POA: Diagnosis not present

## 2017-07-11 DIAGNOSIS — I10 Essential (primary) hypertension: Secondary | ICD-10-CM | POA: Diagnosis not present

## 2017-07-11 DIAGNOSIS — F0281 Dementia in other diseases classified elsewhere with behavioral disturbance: Secondary | ICD-10-CM | POA: Diagnosis not present

## 2017-07-11 DIAGNOSIS — I679 Cerebrovascular disease, unspecified: Secondary | ICD-10-CM | POA: Diagnosis not present

## 2017-07-11 DIAGNOSIS — E785 Hyperlipidemia, unspecified: Secondary | ICD-10-CM | POA: Diagnosis not present

## 2017-07-11 DIAGNOSIS — N183 Chronic kidney disease, stage 3 (moderate): Secondary | ICD-10-CM | POA: Diagnosis not present

## 2017-07-11 DIAGNOSIS — G20 Parkinson's disease: Secondary | ICD-10-CM | POA: Diagnosis not present

## 2017-07-12 DIAGNOSIS — G20 Parkinson's disease: Secondary | ICD-10-CM | POA: Diagnosis not present

## 2017-07-12 DIAGNOSIS — I10 Essential (primary) hypertension: Secondary | ICD-10-CM | POA: Diagnosis not present

## 2017-07-12 DIAGNOSIS — E785 Hyperlipidemia, unspecified: Secondary | ICD-10-CM | POA: Diagnosis not present

## 2017-07-12 DIAGNOSIS — F0281 Dementia in other diseases classified elsewhere with behavioral disturbance: Secondary | ICD-10-CM | POA: Diagnosis not present

## 2017-07-12 DIAGNOSIS — N183 Chronic kidney disease, stage 3 (moderate): Secondary | ICD-10-CM | POA: Diagnosis not present

## 2017-07-12 DIAGNOSIS — I679 Cerebrovascular disease, unspecified: Secondary | ICD-10-CM | POA: Diagnosis not present

## 2017-07-16 DIAGNOSIS — G20 Parkinson's disease: Secondary | ICD-10-CM | POA: Diagnosis not present

## 2017-07-16 DIAGNOSIS — F0281 Dementia in other diseases classified elsewhere with behavioral disturbance: Secondary | ICD-10-CM | POA: Diagnosis not present

## 2017-07-16 DIAGNOSIS — E785 Hyperlipidemia, unspecified: Secondary | ICD-10-CM | POA: Diagnosis not present

## 2017-07-16 DIAGNOSIS — I10 Essential (primary) hypertension: Secondary | ICD-10-CM | POA: Diagnosis not present

## 2017-07-16 DIAGNOSIS — N183 Chronic kidney disease, stage 3 (moderate): Secondary | ICD-10-CM | POA: Diagnosis not present

## 2017-07-16 DIAGNOSIS — I679 Cerebrovascular disease, unspecified: Secondary | ICD-10-CM | POA: Diagnosis not present

## 2017-07-17 DIAGNOSIS — I10 Essential (primary) hypertension: Secondary | ICD-10-CM | POA: Diagnosis not present

## 2017-07-17 DIAGNOSIS — G20 Parkinson's disease: Secondary | ICD-10-CM | POA: Diagnosis not present

## 2017-07-17 DIAGNOSIS — F0281 Dementia in other diseases classified elsewhere with behavioral disturbance: Secondary | ICD-10-CM | POA: Diagnosis not present

## 2017-07-17 DIAGNOSIS — N183 Chronic kidney disease, stage 3 (moderate): Secondary | ICD-10-CM | POA: Diagnosis not present

## 2017-07-17 DIAGNOSIS — E785 Hyperlipidemia, unspecified: Secondary | ICD-10-CM | POA: Diagnosis not present

## 2017-07-17 DIAGNOSIS — I679 Cerebrovascular disease, unspecified: Secondary | ICD-10-CM | POA: Diagnosis not present

## 2017-07-18 DIAGNOSIS — F0281 Dementia in other diseases classified elsewhere with behavioral disturbance: Secondary | ICD-10-CM | POA: Diagnosis not present

## 2017-07-18 DIAGNOSIS — I10 Essential (primary) hypertension: Secondary | ICD-10-CM | POA: Diagnosis not present

## 2017-07-18 DIAGNOSIS — I679 Cerebrovascular disease, unspecified: Secondary | ICD-10-CM | POA: Diagnosis not present

## 2017-07-18 DIAGNOSIS — G20 Parkinson's disease: Secondary | ICD-10-CM | POA: Diagnosis not present

## 2017-07-18 DIAGNOSIS — N183 Chronic kidney disease, stage 3 (moderate): Secondary | ICD-10-CM | POA: Diagnosis not present

## 2017-07-18 DIAGNOSIS — E785 Hyperlipidemia, unspecified: Secondary | ICD-10-CM | POA: Diagnosis not present

## 2017-07-19 DIAGNOSIS — N183 Chronic kidney disease, stage 3 (moderate): Secondary | ICD-10-CM | POA: Diagnosis not present

## 2017-07-19 DIAGNOSIS — E785 Hyperlipidemia, unspecified: Secondary | ICD-10-CM | POA: Diagnosis not present

## 2017-07-19 DIAGNOSIS — G20 Parkinson's disease: Secondary | ICD-10-CM | POA: Diagnosis not present

## 2017-07-19 DIAGNOSIS — I679 Cerebrovascular disease, unspecified: Secondary | ICD-10-CM | POA: Diagnosis not present

## 2017-07-19 DIAGNOSIS — I10 Essential (primary) hypertension: Secondary | ICD-10-CM | POA: Diagnosis not present

## 2017-07-19 DIAGNOSIS — F0281 Dementia in other diseases classified elsewhere with behavioral disturbance: Secondary | ICD-10-CM | POA: Diagnosis not present

## 2017-07-25 ENCOUNTER — Ambulatory Visit: Payer: Medicare Other | Admitting: Neurology

## 2017-07-25 DIAGNOSIS — F0281 Dementia in other diseases classified elsewhere with behavioral disturbance: Secondary | ICD-10-CM | POA: Diagnosis not present

## 2017-07-25 DIAGNOSIS — G20 Parkinson's disease: Secondary | ICD-10-CM | POA: Diagnosis not present

## 2017-07-25 DIAGNOSIS — E785 Hyperlipidemia, unspecified: Secondary | ICD-10-CM | POA: Diagnosis not present

## 2017-07-25 DIAGNOSIS — I679 Cerebrovascular disease, unspecified: Secondary | ICD-10-CM | POA: Diagnosis not present

## 2017-07-25 DIAGNOSIS — I10 Essential (primary) hypertension: Secondary | ICD-10-CM | POA: Diagnosis not present

## 2017-07-25 DIAGNOSIS — N183 Chronic kidney disease, stage 3 (moderate): Secondary | ICD-10-CM | POA: Diagnosis not present

## 2017-07-26 DIAGNOSIS — I679 Cerebrovascular disease, unspecified: Secondary | ICD-10-CM | POA: Diagnosis not present

## 2017-07-26 DIAGNOSIS — F0281 Dementia in other diseases classified elsewhere with behavioral disturbance: Secondary | ICD-10-CM | POA: Diagnosis not present

## 2017-07-26 DIAGNOSIS — N183 Chronic kidney disease, stage 3 (moderate): Secondary | ICD-10-CM | POA: Diagnosis not present

## 2017-07-26 DIAGNOSIS — E785 Hyperlipidemia, unspecified: Secondary | ICD-10-CM | POA: Diagnosis not present

## 2017-07-26 DIAGNOSIS — G20 Parkinson's disease: Secondary | ICD-10-CM | POA: Diagnosis not present

## 2017-07-26 DIAGNOSIS — I10 Essential (primary) hypertension: Secondary | ICD-10-CM | POA: Diagnosis not present

## 2017-08-08 NOTE — Progress Notes (Signed)
Britini Garcilazo was seen today in the movement disorders clinic for neurologic consultation at the request of Patient, No Pcp Per.   The patient is accompanied by her daughter and granddaughter who supplements the history.  The patient is seen today in neurologic consultation for possible Parkinson's disease.  I have reviewed an extensive number of records made available to me.  The patient was hospitalized on December 8 after a fall 2 days prior to admission.  The fall occurred after the patient got up at night while attempting to use the bathroom.  She did apparently taken a Valium earlier that night.  This was just one of many falls that she had sustained.  Her family had questioned whether or not she had had Parkinson's disease and a neurologic consultation was obtained while she was in the hospital.  The patient saw Dr. Leonel Ramsay and he felt that her symptoms were consistent with possible Parkinson's disease.  She was started on levodopa, 1 po tid and told to follow-up here.  Pt and family indicate that it has helped walking and tremor.  She is currently in skilled rehab for 30 days but is only in for short term and has only been there for 1 day.    The patient has previously seen [place] neurolog[facility] and last saw [facility] neurology on 03/15/2015.  She has seen Dr. [name] as well as several of his nurse practitioners.  I reviewed those records in detail.  She had a TIA on 01/03/2013 but there was no tremor when seen by [facility] neurology in July, 2014.  On her follow-up in January, 2015 notes indicate that "action tremor" had developed in the right hand and a resting tremor had developed in the right foot.  In February, 2016 notes indicate that the patient now had decreased arm swing and a mild resting tremor of the right hand and Mysoline was offered to the patient but she did not want that.  On 03/15/2015 the patient reported more falls and it was recommended that they just monitor the tremor.  No new  medications or therapy were given.  She reports fam hx of PD in maternal aunt.   09/19/15 update:  The patient presents today for follow-up, accompanied by her daughter who supplements the history.  I have reviewed prior records made available to me.  Last visit, her levodopa was increased to 1-1/2 tablets 3 times per day and it was recommended that she take these at 7 AM/11 AM/4 PM.  She did not do this and is still taking it tid and spreading out the last to take at bedtime.  States that she remembers when she was in the SNF she knew she was taking an extra 1/2 tablet of something but didn't know what.  She denies any falls since our last visit.  No hallucinations.  No lightheadedness or near syncope.  She did have a modified barium swallow on 08/05/2015.  This was normal.  It was noted that her respiratory status may allow for episodic aspiration due to discoordination of swallow/apneic period.  I did note from SNF records that the patient was on valium 2 mg prn for tremor.   Is still taking it at home but isn't sure how often but thinks one time per day. 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  She is accompanied by her daughter and  granddaughter who supplement the history.  Last visit, her levodopa was increased to 1-1/2 tablets 3 times per day and it was recommended that she take these at 7 AM/11 AM/4 PM. She actually takes these at 7:30/noon/and 6pm (1 hour after supper).   I talked extensively to her last visit about the fact that I did not like her living situation, which was living with her husband who had Alzheimer's disease, given the fact that the patient's own memory was impaired.  I have reviewed  records made available to me since last visit.  She was hospitalized from March 20 to March 23 with a urinary tract infection and discharged to a subacute nursing facility.  Reports that she  is in still in assisted living and "that is my home now."  They help her at night with getting up and they cook the food.  She is able to get herself dressed.  She gets assistance with bathing.  She gets some PT a few times a week and otherwise is in the [facility] of the time.  She had a bedside swallow evaluation on March 21 recommended mechanical soft diet with thin liquids.  No falls.  No hallucinations.    05/24/16 update:  The patient presents today for follow-up, accompanied by her daughter who supplements the history.  She is on carbidopa/levodopa 25/100,  1-1/2 tablets 3 times a day.  She states that her biggest issue is tremor of the right foot.  It is better if she is given her medication on time, which she states is a challenge because she is given that that shift change in the morning.  She asks me to write her a prescription for marijuana.  She denies falls.  She denies lightheadedness or near syncope.  She continues to live at the assisted living facility.  She did follow up with vascular surgery and I reviewed those records.  She had a carotid ultrasound that did not demonstrate any significant stenosis.  This was done on 04/24/2016.  09/24/16 update:  Patient presents today for follow-up.  She is accompanied by her daughter who supplements the history.  Last visit, I increased her levodopa to 2 tablets 3 times per day.  She states that she doesn't know what she is taking and neither does her daughter.   She moved from [facility] to [facility] and really doesn't like it.  She moved because her husband needed memory care and she wanted to move with her husband.  Pt is in assisted living.  No med list accompanies her.   Pt has had 5 falls and all falls have occurred since she moved.  Pt denies lightheadedness, near syncope.  No hallucinations.  Mood has not been good as patient has been so anxious and upset re: living situation.  Had anxiety attack last night.  01/22/17 update:  Patient in today in  follow-up, accompanied by her daughter who supplements the history.  I have reviewed hospital records since last visit.  Admitted to the hospital in early March with UTI with associated encephalopathy.  They discussed with her, as had I in the past, the issue of her being on both Valium and Ativan.  Returned to the hospital the day she was released due to a fall in which she sustained a laceration above the eyebrow that ultimately had to be repaired.  CT of the brain was negative.  She came back on 10/14/2016 with another fall off of the toilet.  She again struck her head.  CT of the head was again performed and again was nonacute.  Unknown to the hospital on 11/04/2016 with acute encephalopathy.  This was felt secondary to urinary tract infection.  Depakote was discontinued as was Valium. She returns today on both Depakote and valium and while valium listed as prn it states "Per NP, may give at 8am, 2pm, 8pm."  Modified barium swallow was done on 11/07/2016 which demonstrated moderate oral and pharyngeal phase dysphagia..  Pured solids with honey thick liquids was recommended.  She lives at [facility] assisted living now but has additional caregivers that rotate shifts with her.  She is noting more tremor.  Only on one tablet carbidopa/levodopa 25/100 tid.  Used to be on 2 po tid.  Not sure when that changed.    08/09/17 update: Patient is seen today in follow-up.  She is accompanied by her daughter who supplements the history.  Records have been reviewed since last visit.  The patient is on carbidopa/levodopa 25/100, 2 tablets in the morning, 1 in the afternoon and 2 in the evening.  She returns on carbidopa/levodopa 25/100, 2 po tid.  She has been in the emergency room several times since her last visit.  She was in the emergency room in July for right ear pain.  She was back in the emergency room in August after a fall when transferring from the bed.  She did have a scalp hematoma but CT of the brain was negative.   She continued to complain of right ear pain on that ER visit.  She was in the emergency room in September after a fall in the bathtub.  She had a laceration on the forehead.  Pt denies lightheadedness, near syncope.  Her daughter did call me in October and stated that the patient was very combative at the nursing home.  She was on hospice, and the patient's daughter did not have a list of medications.  She was supposed to call me back once she had a list of current medications.  She did not do that.  She has had no hallucinations.  She does not walk.     ALLERGIES:   Allergies  Allergen Reactions  . Relafen [Nabumetone] Diarrhea and Other (See Comments)    GI / Urinary Bleeding  . Lipitor [Atorvastatin] Other (See Comments)    Causes memory loss  . Other Other (See Comments)    "orvail" unknown:  Causes Bloody stool  . Penicillins Hives, Itching, Swelling and Rash    Has patient had a PCN reaction causing immediate rash, facial/tongue/throat swelling, SOB or lightheadedness with hypotension: no Has patient had a PCN reaction causing severe rash involving mucus membranes or skin necrosis: unknown Has patient had a PCN reaction that required hospitalization no Has patient had a PCN reaction occurring within the last 10 years: no If all of the above answers are "NO", then may proceed with Cephalosporin use.     CURRENT MEDICATIONS:  Outpatient Encounter Medications as of 08/09/2017  Medication Sig  . acetaminophen (TYLENOL) 325 MG tablet Take 650 mg by mouth every 8 (eight) hours.  Marland Kitchen ADVAIR HFA 230-21 MCG/ACT inhaler Inhale 2 puffs into the lungs 2 (two) times daily.  Marland Kitchen aspirin 81 MG chewable tablet Chew 81 mg by mouth daily.  . bisacodyl (DULCOLAX) 10 MG suppository Place 10 mg rectally once a week.   . carbidopa-levodopa (SINEMET IR) 25-100 MG tablet Take 2 tablets by mouth 3 (three) times daily. for parkinson's disease  . Cranberry 450 MG  CAPS Take 450 mg by mouth daily.  Marland Kitchen escitalopram  (LEXAPRO) 20 MG tablet Take 20 mg by mouth daily.   Marland Kitchen gabapentin (NEURONTIN) 100 MG capsule Take 100 mg by mouth 2 (two) times daily.  Marland Kitchen levothyroxine (SYNTHROID) 88 MCG tablet Take 88 mcg by mouth daily at 6 (six) AM.   . LORazepam (ATIVAN) 1 MG tablet Take 1 mg by mouth 2 (two) times daily.  Marland Kitchen omeprazole (PRILOSEC) 20 MG capsule Take 20 mg by mouth daily.  . QUEtiapine (SEROQUEL) 25 MG tablet Take 25 mg by mouth daily.  . QUEtiapine (SEROQUEL) 50 MG tablet Take 75 mg by mouth 2 (two) times daily.  Marland Kitchen senna (SENOKOT) 8.6 MG tablet Take 1 tablet by mouth daily.  . [DISCONTINUED] divalproex (DEPAKOTE) 500 MG DR tablet Take 500 mg by mouth 2 (two) times daily.   . [DISCONTINUED] LORazepam (ATIVAN) 0.5 MG tablet Take 0.5 mg by mouth 2 (two) times daily.   No facility-administered encounter medications on file as of 08/09/2017.     PAST MEDICAL HISTORY:   Past Medical History:  Diagnosis Date  . Acute cystitis   . Anemia   . Anxiety   . Aortic stenosis, mild 11/18/2013  . Asthma   . Back pain   . Bronchitis, acute   . Carotid artery occlusion   . Cerebrovascular disease   . COPD (chronic obstructive pulmonary disease) ([place])   . Dementia   . Depression   . Diverticulosis of colon   . DJD (degenerative joint disease)   . Fall 07/05/2015  . GERD (gastroesophageal reflux disease)   . History of sudden visual loss   . Hyperlipidemia   . Hypertension   . Hypothyroidism   . Parkinson's disease ([place])   . Pneumonia 2015   hx of x 2   . Pulmonary hypertension, moderate to severe ([place]) 11/19/2014  . Shortness of breath    with exertion   . 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 Transient ischemic attack   . UTI (lower urinary tract infection)    hx of     PAST SURGICAL HISTORY:   Past Surgical History:  Procedure Laterality Date  . CARDIAC CATHETERIZATION  02/18/14  . CARDIOTHORACIC PROCEDURE  01/13/2009   Right   . COLECTOMY    . ENDARTERECTOMY     right carotid  endartarectomy - 2010  . FOOT SURGERY     nerve cut between toes   . LEFT AND RIGHT HEART CATHETERIZATION WITH CORONARY ANGIOGRAM N/A 02/18/2014   Procedure: LEFT AND RIGHT HEART CATHETERIZATION WITH CORONARY ANGIOGRAM;  Surgeon: Larey Dresser, MD;  Location: [facility] CATH LAB;  Service: Cardiovascular;  Laterality: N/A;  . REPLACEMENT TOTAL KNEE     right and left knee  . TEE WITHOUT CARDIOVERSION N/A 02/08/2014   Procedure: TRANSESOPHAGEAL ECHOCARDIOGRAM (TEE);  Surgeon: Larey Dresser, MD;  Location: [place];  Service: Cardiovascular;  Laterality: N/A;  . TOTAL KNEE ARTHROPLASTY Left 11/17/2012   Procedure: TOTAL KNEE ARTHROPLASTY;  Surgeon: Gearlean Alf, MD;  Location: [facility];  Service: Orthopedics;  Laterality: Left;  . TUBAL LIGATION    . VAGINAL HYSTERECTOMY      SOCIAL HISTORY:   Social History   Socioeconomic History  . Marital status: Married    Spouse name: Journalist, newspaper  . Number of children: 3  . Years of education: Not on file  . Highest education level: Not on file  Social Needs  .  Financial resource strain: Not on file  . Food insecurity - worry: Not on file  . Food insecurity - inability: Not on file  . Transportation needs - medical: Not on file  . Transportation needs - non-medical: Not on file  Occupational History  . Not on file  Tobacco Use  . Smoking status: Never Smoker  . Smokeless tobacco: Never Used  Substance and Sexual Activity  . Alcohol use: No  . Drug use: No  . Sexual activity: Not on file  Other Topics Concern  . Not on file  Social History Narrative  . Not on file    FAMILY HISTORY:   Family Status  Relation Name Status  . Father  Deceased       Emphysema  . Mother  Deceased       CHF  . Sister  Alive       heart disease  . Brother  Alive       COPD  . Sister  Alive       COPD  . Sister  (Not Specified)  . Brother  (Not Specified)    ROS:  A complete 10 system review of systems was obtained and was unremarkable apart from  what is mentioned above.  PHYSICAL EXAMINATION:    VITALS:   Vitals:   08/09/17 1416  BP: 106/64  Pulse: 80  SpO2: 97%    GEN:  The patient appears stated age and is in NAD.   HEENT:  Normocephalic, atraumatic.  The mucous membranes are moist. The superficial temporal arteries are without ropiness or tenderness. CV:  RRR Lungs:  CTAB.  She is not SOB.   Neck/HEME:  There are no carotid bruits bilaterally.  Neurological examination:  Orientation:  Montreal Cognitive Assessment  08/09/2017 02/16/2016  Visuospatial/ Executive (0/5) 0 3  Naming (0/3) 3 3  Attention: Read list of digits (0/2) 2 2  Attention: Read list of letters (0/1) 0 1  Attention: Serial 7 subtraction starting at 100 (0/3) 1 1  Language: Repeat phrase (0/2) 2 2  Language : Fluency (0/1) 0 1  Abstraction (0/2) 1 2  Delayed Recall (0/5) 0 2  Orientation (0/6) 5 6  Total 14 23  Adjusted Score (based on education) 15 24    Cranial nerves: There is good facial symmetry.  There is significant facial hypomimia.  The speech is fluent and clear.  She is hypophonic.  Soft palate rises symmetrically and there is no tongue deviation. Hearing is intact to conversational tone. Sensation: Sensation is intact to light touch throughout. Motor: Strength is 5/5 in the bilateral upper and lower extremities.   Shoulder shrug is equal and symmetric.  There is no pronator drift.   Movement examination: Tone: There is no rigidity today.   Abnormal movements: There is a mild tremor in the bilateral UE Coordination:  There is no significant decremation today. Gait and Station: no longer ambulates  ASSESSMENT/PLAN:  1.  Idiopathic Parkinson's disease.  While this is just diagnosed in 06/2015, she has had documented sx's since at least 07/2013.    -pt returns on carbidopa/levodopa 25/100, 2 po tid.  She has come in a few different dosages the last few times.  She looks about the same the last few visits.   -encouraged engaging in  activities at [place]. 2.  Dysphagia  -Modified barium swallow was done on 11/07/2016 which demonstrated moderate oral and pharyngeal phase dysphagia..  Pured solids with honey thick liquids was recommended.  She  is following this diet at [facility].   3  Anxiety  -has switched back and forth between ativan and valium.  Back on ativan.  4.  F/u prn.

## 2017-08-09 ENCOUNTER — Encounter: Payer: Self-pay | Admitting: Neurology

## 2017-08-09 ENCOUNTER — Ambulatory Visit (INDEPENDENT_AMBULATORY_CARE_PROVIDER_SITE_OTHER): Admitting: Neurology

## 2017-08-09 VITALS — BP 106/64 | HR 80

## 2017-08-09 DIAGNOSIS — G20 Parkinson's disease: Secondary | ICD-10-CM | POA: Diagnosis not present

## 2017-08-09 NOTE — Patient Instructions (Signed)
1.  No changes in medication 2.  Follow up on an as needed basis

## 2017-08-21 IMAGING — CR DG CHEST 1V PORT
1 series · 1 of 1 positions shown · non-contrast
Comparison: 10/03/2016 chest radiograph.

CLINICAL DATA: Altered mental status

EXAM:
PORTABLE CHEST 1 VIEW

[AP]
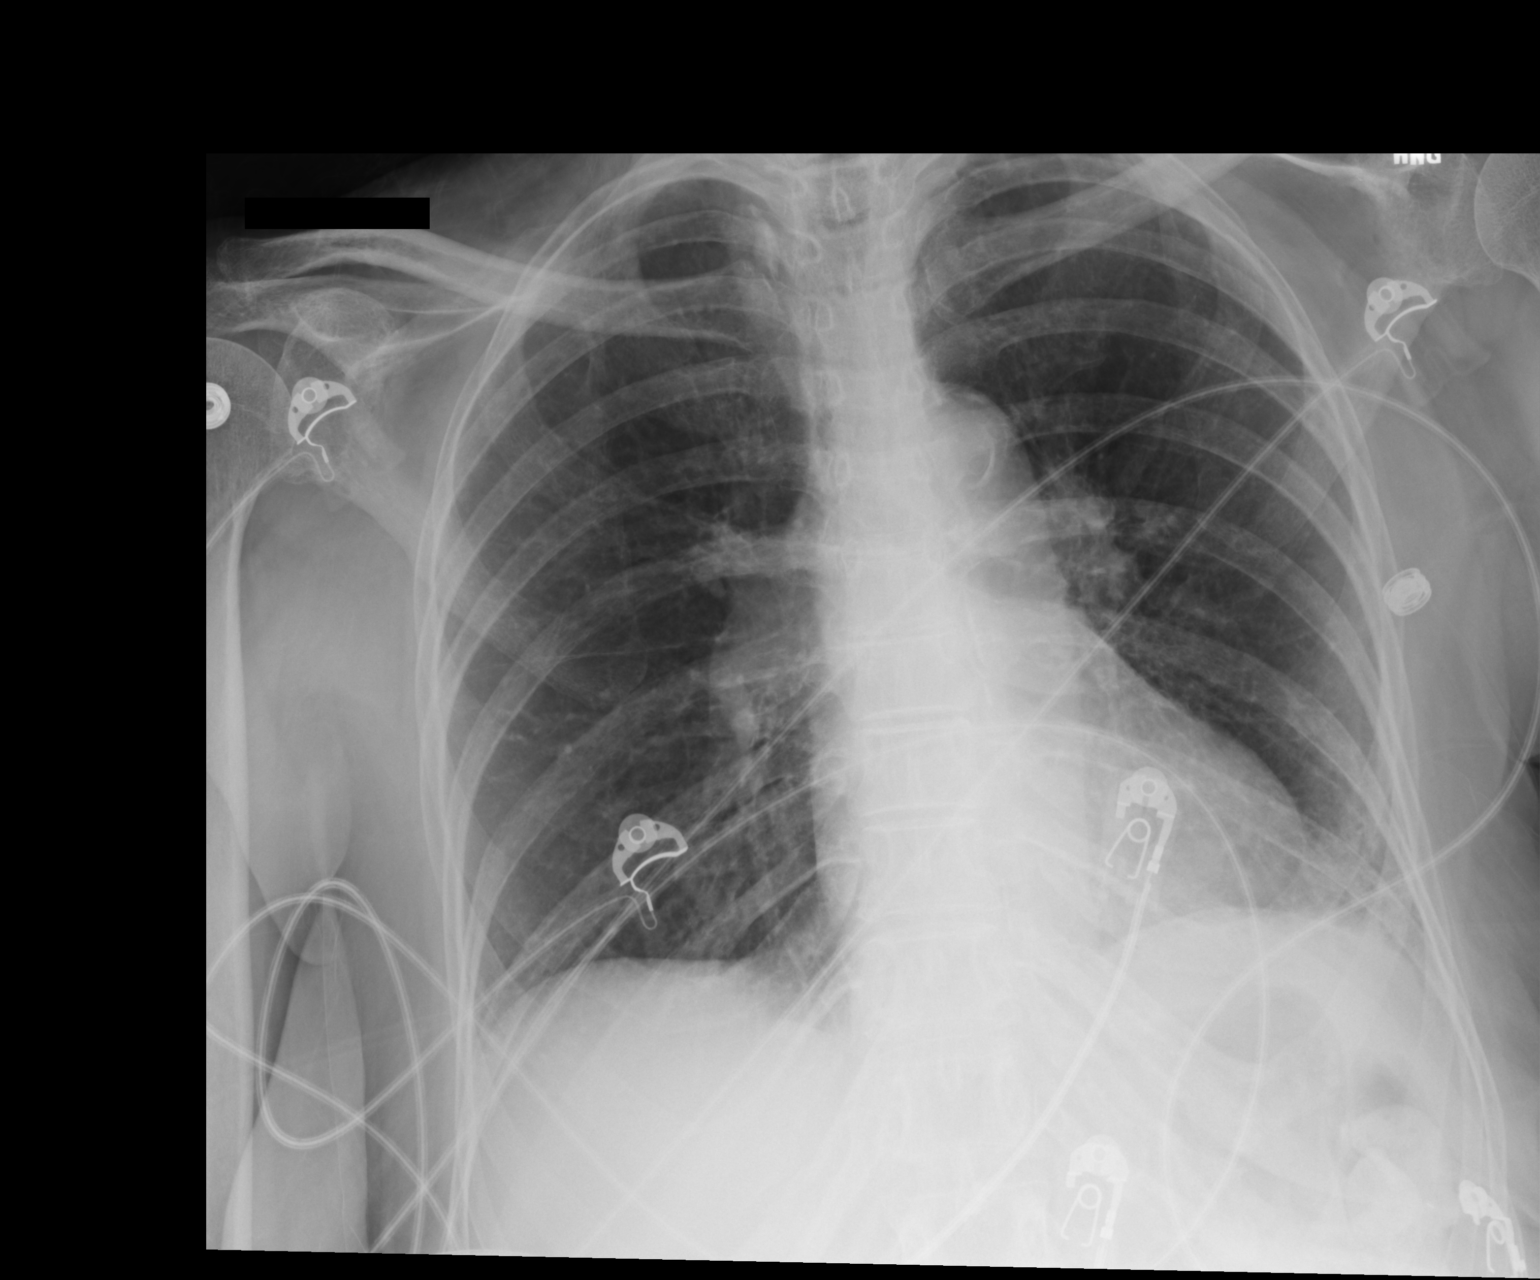

[1 of 1 positions shown; findings below may reference images not displayed]

FINDINGS: Stable cardiomediastinal silhouette with normal heart size and
aortic atherosclerosis. No pneumothorax. No pleural effusion. Mild
patchy left lung base opacity, not clearly changed. No pulmonary
edema.
IMPRESSION: Mild patchy left lung base opacity, not clearly changed since
10/03/2016, favor mild scarring or atelectasis.

Aortic atherosclerosis.

## 2017-08-24 IMAGING — CR DG CHEST 1V PORT
1 series · 1 of 1 positions shown · non-contrast
Comparison: 11/04/2016

CLINICAL DATA: Shortness of breath

EXAM:
PORTABLE CHEST 1 VIEW

[portable]
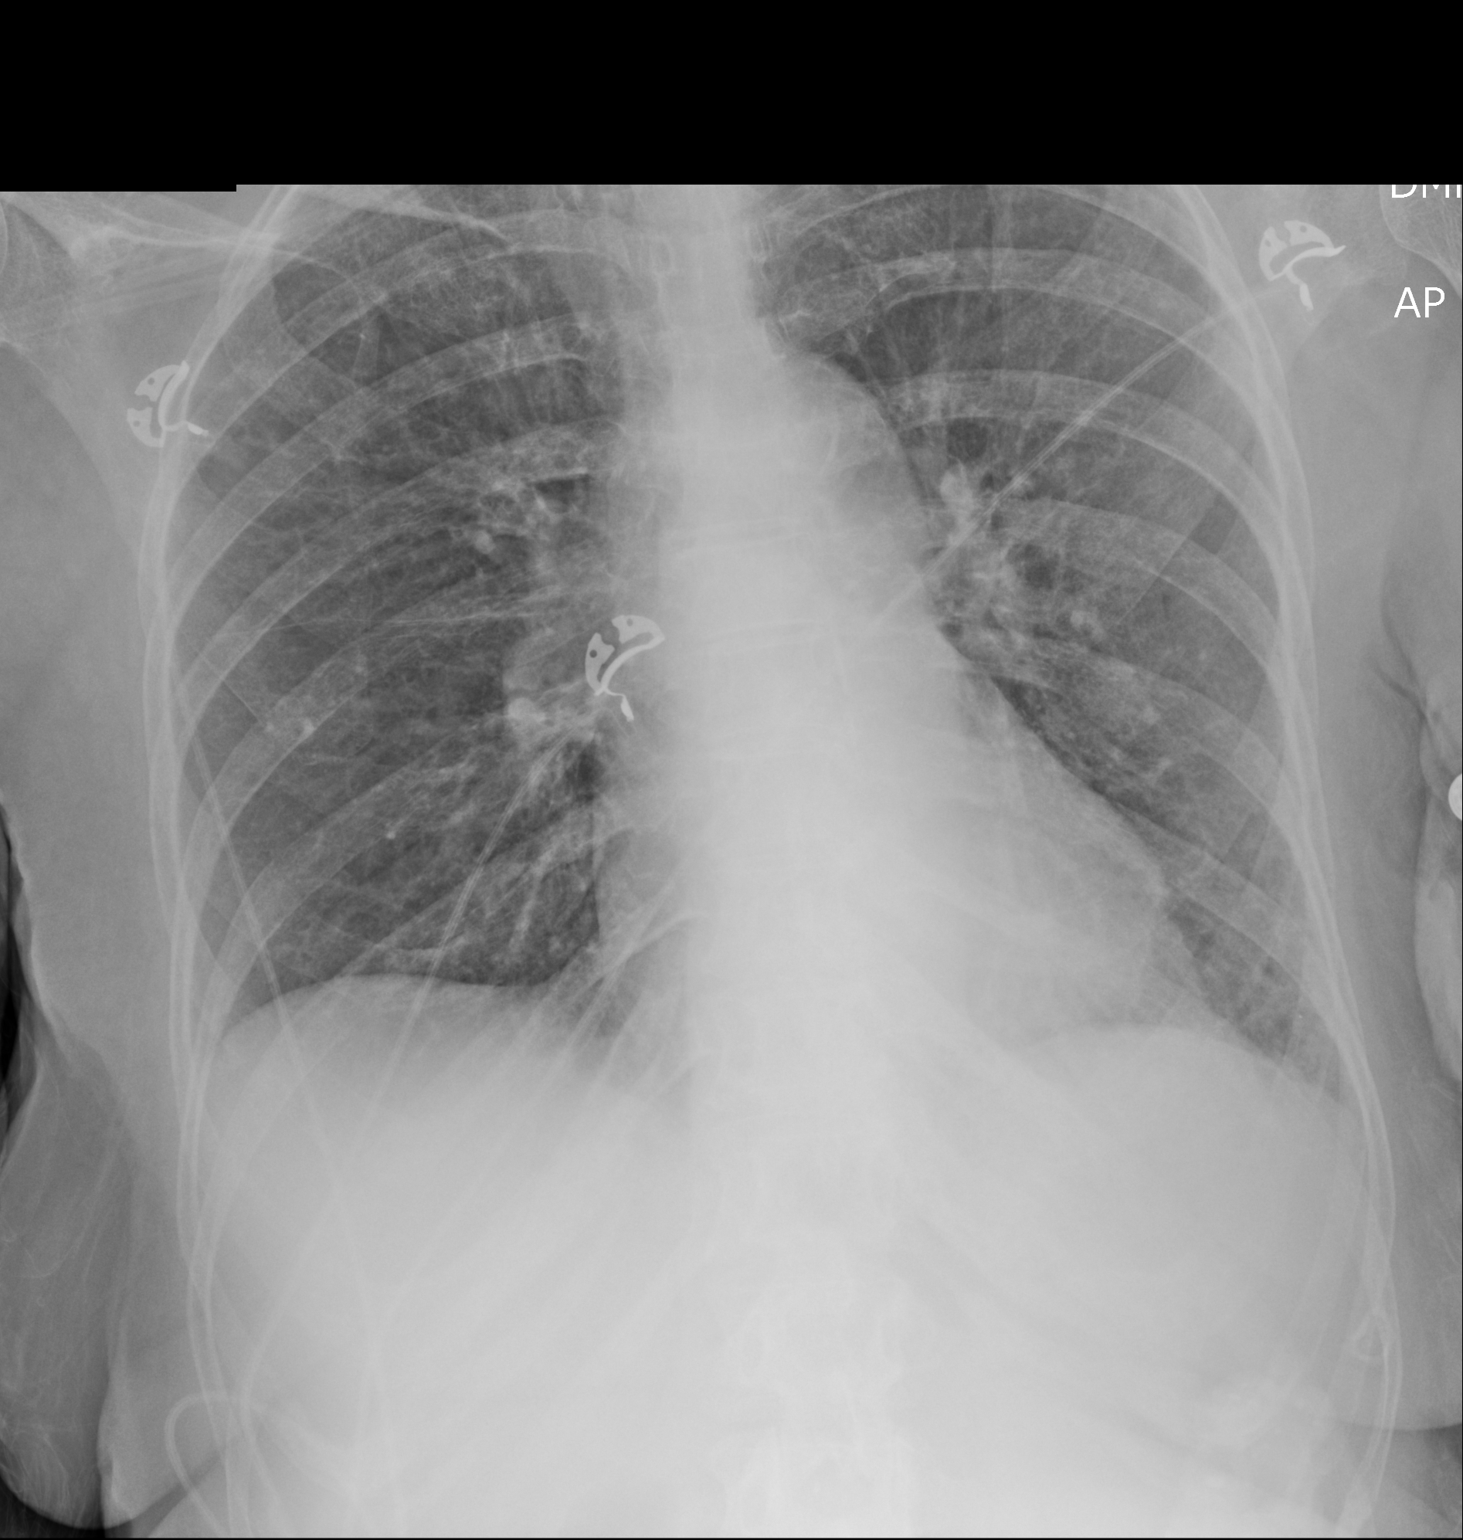

[1 of 1 positions shown; findings below may reference images not displayed]

FINDINGS: Unchanged hazy density at the peripheral left chest, likely chronic
since at least 8876 chest x-ray. There is subpleural reticulation at
the left base based on 10/17/2015 abdominal CT. Unchanged
interstitial prominence, likely bronchial calcification. There is no
edema, consolidation, effusion, or pneumothorax. Normal heart size
mediastinal contours
IMPRESSION: Stable compared to prior.  No evidence of acute disease.

## 2017-12-27 IMAGING — CT CT HEAD W/O CM
3 of 8 series · 12 of 47 positions shown, 14 images · non-contrast
Comparison: CT of the head performed 11/04/2016, and CT of the
cervical spine performed 10/05/2016

CLINICAL DATA: Status post fall. Hit head on floor, with right ear
pain and left-sided head knot. Concern for cervical spine injury.
Initial encounter.

EXAM:
CT HEAD WITHOUT CONTRAST
CT CERVICAL SPINE WITHOUT CONTRAST
TECHNIQUE: Multidetector CT imaging of the head and cervical spine was
performed following the standard protocol without intravenous
contrast. Multiplanar CT image reconstructions of the cervical spine
were also generated.

[Series 6: coronal · coronal · 0.28mm/px · 3 of 63 slices shown]
[im 18/63  brain]
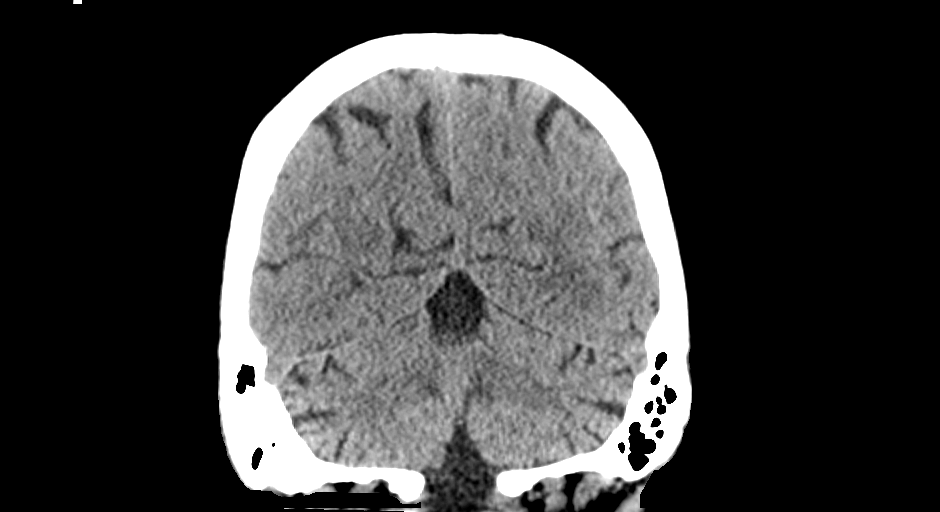
[im 27/63  brain]
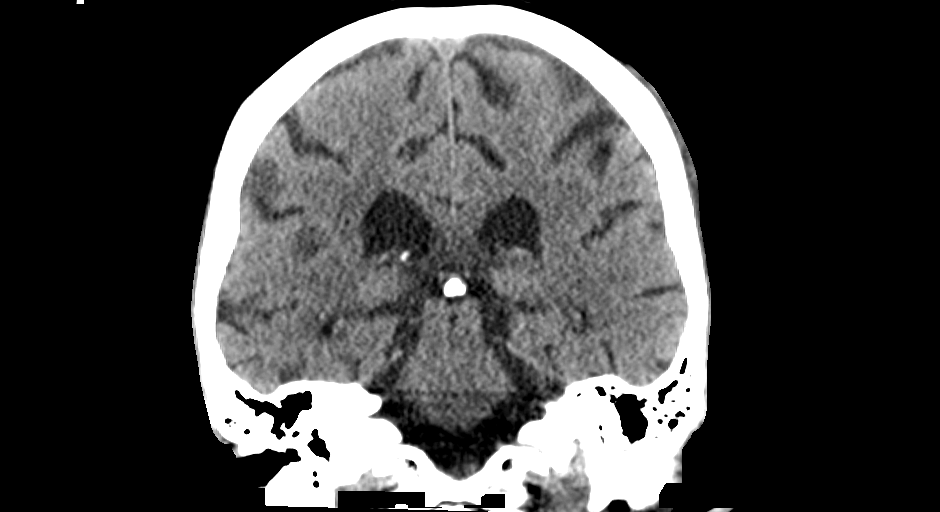
[im 36/63  brain]
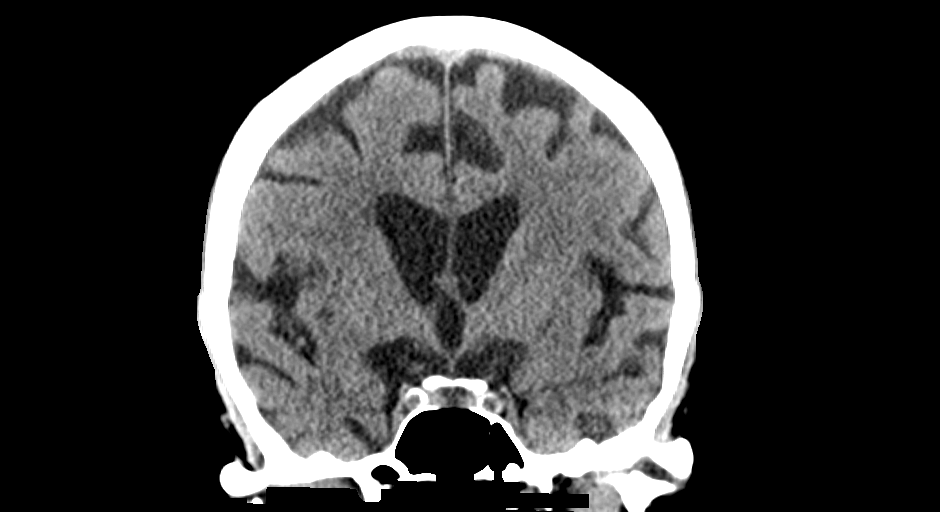

[Series 7: sagittal · sagittal · 0.28mm/px · 2 of 51 slices shown]
[im 17/51  brain]
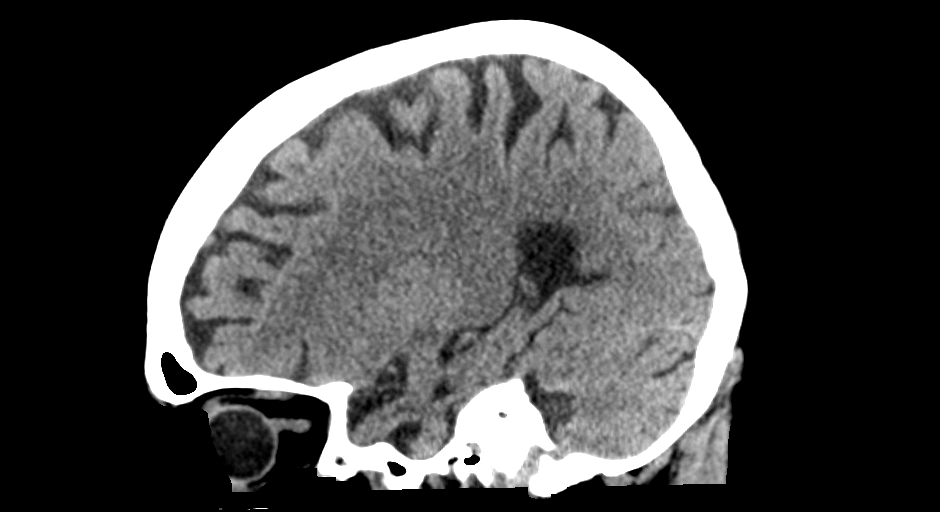
[im 34/51  brain]
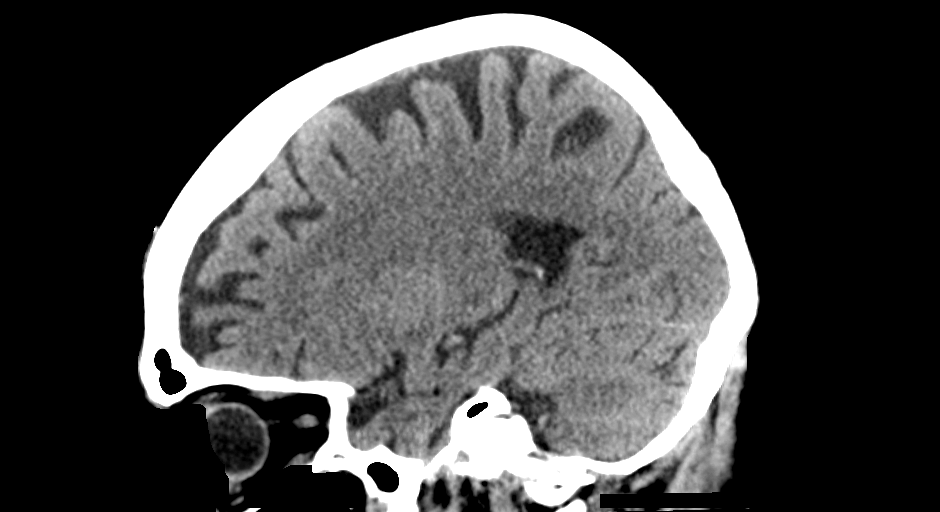

[Series 602: axial reformats · axial · 0.35mm/px · z∈[-236,-87]mm · 7 of 106 slices shown, 9 images]
[im 10/106  brain]
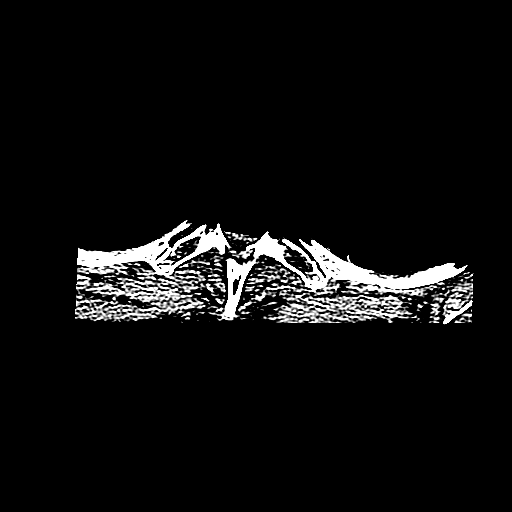
[im 10/106  bone]
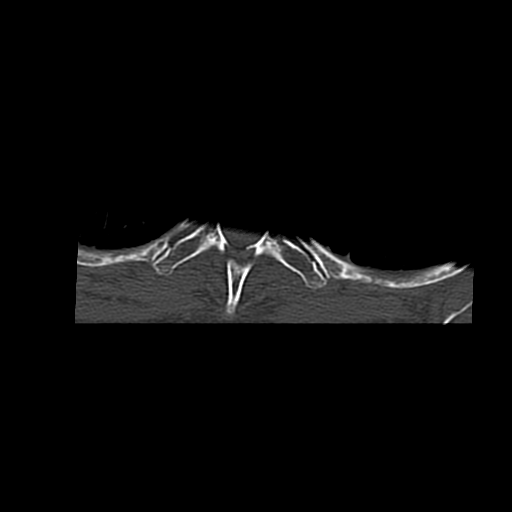
[im 29/106  brain]
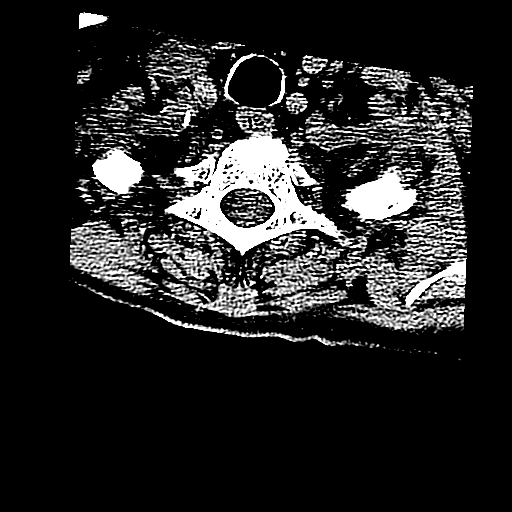
[im 39/106  brain]
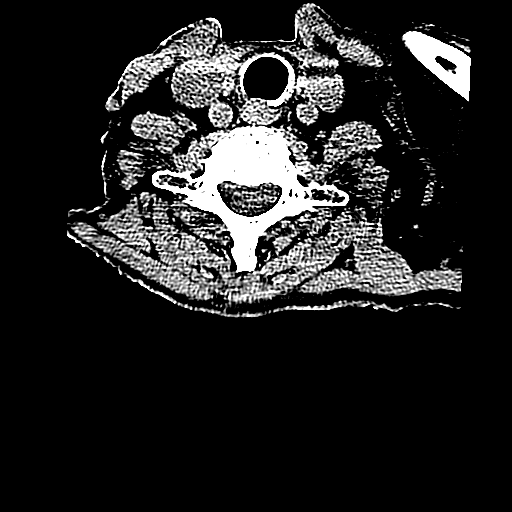
[im 58/106  brain]
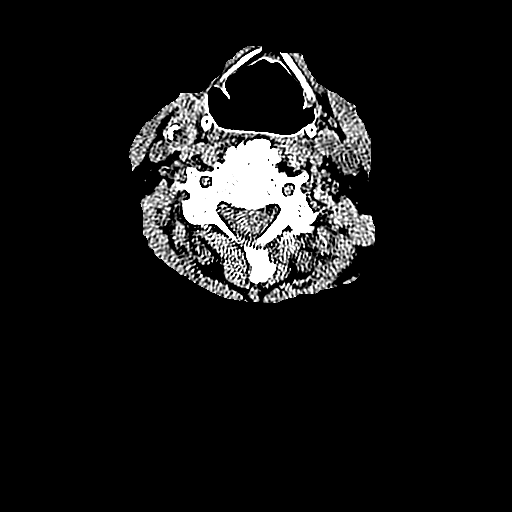
[im 67/106  brain]
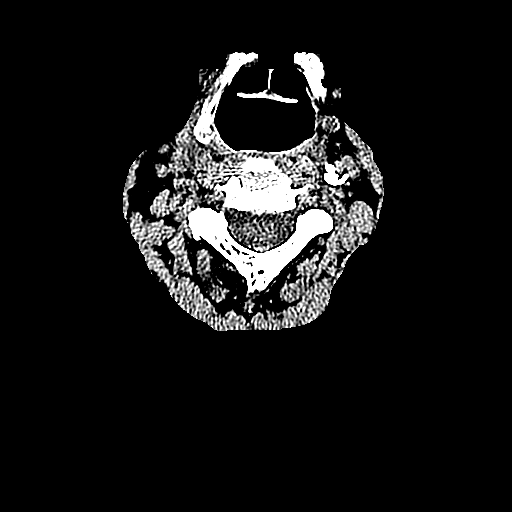
[im 67/106  bone]
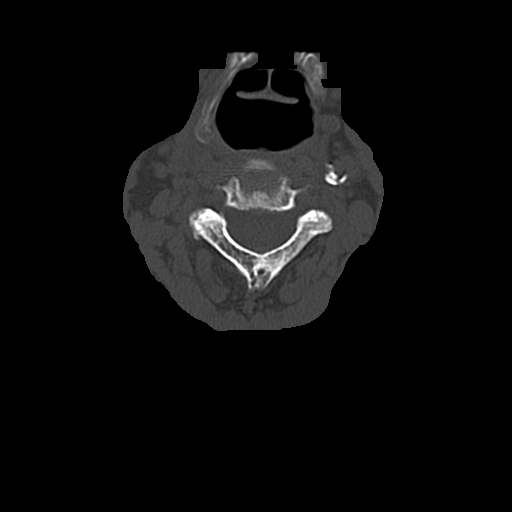
[im 77/106  brain]
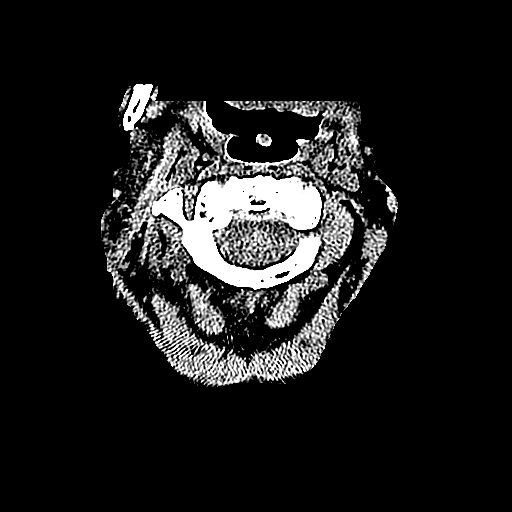
[im 96/106  brain]
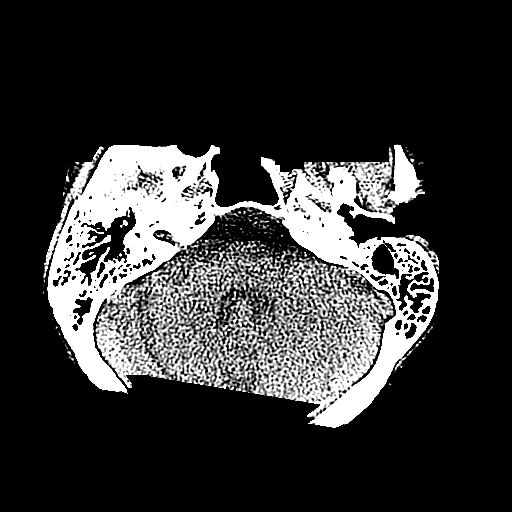

[12 of 47 positions shown; findings below may reference images not displayed]

FINDINGS: CT HEAD FINDINGS

Brain: No evidence of acute infarction, hemorrhage, hydrocephalus,
extra-axial collection or mass lesion/mass effect.

Prominence of the ventricles and sulci reflects moderate cortical
volume loss. Cerebellar atrophy is noted. Scattered periventricular
subcortical white matter change likely reflects small vessel
ischemic microangiopathy.

The brainstem and fourth ventricle are within normal limits. The
basal ganglia are unremarkable in appearance. The cerebral
hemispheres demonstrate grossly normal gray-white differentiation.
No mass effect or midline shift is seen.

Vascular: No hyperdense vessel or unexpected calcification.

Skull: There is no evidence of fracture; visualized osseous
structures are unremarkable in appearance.

Sinuses/Orbits: The orbits are within normal limits. The paranasal
sinuses and mastoid air cells are well-aerated.

Other: Soft tissue swelling is noted overlying the high left
parietal calvarium.

CT CERVICAL SPINE FINDINGS

Alignment: Normal.

Skull base and vertebrae: No acute fracture. No primary bone lesion
or focal pathologic process.

Soft tissues and spinal canal: No prevertebral fluid or swelling. No
visible canal hematoma.

Disc levels: Multilevel disc space narrowing is noted along the
cervical spine, with scattered anterior and posterior disc
osteophyte complexes.

Upper chest: Scarring is noted at the lung apices. The thyroid gland
is diminutive and grossly unremarkable. Calcification is noted at
the carotid bifurcations bilaterally. There is mild-to-moderate
left-sided luminal narrowing.

Other: No additional soft tissue abnormalities are seen.
IMPRESSION: 1. No evidence of traumatic intracranial injury or fracture.
2. No evidence of fracture or subluxation along the cervical spine.
3. Soft tissue swelling noted overlying the high left parietal
calvarium.
4. Moderate cortical volume loss and scattered small vessel ischemic
microangiopathy.
5. Degenerative change noted along the cervical spine.
6. Scarring at the lung apices.
7. Calcification at the carotid bifurcations bilaterally.
Mild-to-moderate left-sided luminal narrowing noted. Carotid
ultrasound would be helpful for further evaluation, when and as
deemed clinically appropriate.

## 2018-02-14 ENCOUNTER — Telehealth: Payer: Self-pay

## 2018-02-14 NOTE — Telephone Encounter (Signed)
Phone call placed to patient. Spoke with son in law who directed that patient's daughter be contacted at work.

## 2018-02-14 NOTE — Telephone Encounter (Signed)
Spoke with daughter, Leda Gauze, who requested that visit scheduled with caregiver, Ellison Hughs @ [phone]

## 2018-02-14 NOTE — Telephone Encounter (Signed)
Phone call placed to caregiver, Ellison Hughs, at the direction of patient's daughter to schedule visit with Palliative Care. Visit scheduled for Wednesday 02/19/18

## 2018-02-19 ENCOUNTER — Other Ambulatory Visit: Payer: Medicare Other | Admitting: Internal Medicine

## 2018-02-26 ENCOUNTER — Emergency Department (HOSPITAL_COMMUNITY): Payer: Medicare Other

## 2018-02-26 ENCOUNTER — Other Ambulatory Visit: Payer: Self-pay

## 2018-02-26 ENCOUNTER — Observation Stay (HOSPITAL_COMMUNITY)
Admission: EM | Admit: 2018-02-26 | Discharge: 2018-02-27 | Disposition: A | Discharge status: Home / Self Care | Payer: Medicare Other | Attending: Internal Medicine | Admitting: Internal Medicine

## 2018-02-26 DIAGNOSIS — B9620 Unspecified Escherichia coli [E. coli] as the cause of diseases classified elsewhere: Secondary | ICD-10-CM | POA: Diagnosis not present

## 2018-02-26 DIAGNOSIS — G309 Alzheimer's disease, unspecified: Secondary | ICD-10-CM | POA: Insufficient documentation

## 2018-02-26 DIAGNOSIS — J9611 Chronic respiratory failure with hypoxia: Secondary | ICD-10-CM | POA: Insufficient documentation

## 2018-02-26 DIAGNOSIS — N183 Chronic kidney disease, stage 3 unspecified: Secondary | ICD-10-CM | POA: Diagnosis present

## 2018-02-26 DIAGNOSIS — F418 Other specified anxiety disorders: Secondary | ICD-10-CM | POA: Diagnosis not present

## 2018-02-26 DIAGNOSIS — G9340 Encephalopathy, unspecified: Secondary | ICD-10-CM | POA: Diagnosis present

## 2018-02-26 DIAGNOSIS — F329 Major depressive disorder, single episode, unspecified: Secondary | ICD-10-CM | POA: Diagnosis present

## 2018-02-26 DIAGNOSIS — E785 Hyperlipidemia, unspecified: Secondary | ICD-10-CM | POA: Diagnosis not present

## 2018-02-26 DIAGNOSIS — Z880 Allergy status to penicillin: Secondary | ICD-10-CM | POA: Insufficient documentation

## 2018-02-26 DIAGNOSIS — J449 Chronic obstructive pulmonary disease, unspecified: Secondary | ICD-10-CM | POA: Diagnosis present

## 2018-02-26 DIAGNOSIS — R2681 Unsteadiness on feet: Secondary | ICD-10-CM | POA: Diagnosis not present

## 2018-02-26 DIAGNOSIS — R4702 Dysphasia: Secondary | ICD-10-CM | POA: Insufficient documentation

## 2018-02-26 DIAGNOSIS — N390 Urinary tract infection, site not specified: Principal | ICD-10-CM | POA: Insufficient documentation

## 2018-02-26 DIAGNOSIS — I700 Atherosclerosis of aorta: Secondary | ICD-10-CM | POA: Diagnosis not present

## 2018-02-26 DIAGNOSIS — F0280 Dementia in other diseases classified elsewhere without behavioral disturbance: Secondary | ICD-10-CM | POA: Insufficient documentation

## 2018-02-26 DIAGNOSIS — Z8673 Personal history of transient ischemic attack (TIA), and cerebral infarction without residual deficits: Secondary | ICD-10-CM | POA: Diagnosis not present

## 2018-02-26 DIAGNOSIS — K219 Gastro-esophageal reflux disease without esophagitis: Secondary | ICD-10-CM | POA: Insufficient documentation

## 2018-02-26 DIAGNOSIS — G20 Parkinson's disease: Secondary | ICD-10-CM | POA: Diagnosis present

## 2018-02-26 DIAGNOSIS — Z79899 Other long term (current) drug therapy: Secondary | ICD-10-CM | POA: Diagnosis not present

## 2018-02-26 DIAGNOSIS — D539 Nutritional anemia, unspecified: Secondary | ICD-10-CM | POA: Insufficient documentation

## 2018-02-26 DIAGNOSIS — I129 Hypertensive chronic kidney disease with stage 1 through stage 4 chronic kidney disease, or unspecified chronic kidney disease: Secondary | ICD-10-CM | POA: Insufficient documentation

## 2018-02-26 DIAGNOSIS — F419 Anxiety disorder, unspecified: Secondary | ICD-10-CM

## 2018-02-26 DIAGNOSIS — Z66 Do not resuscitate: Secondary | ICD-10-CM | POA: Diagnosis not present

## 2018-02-26 DIAGNOSIS — I959 Hypotension, unspecified: Secondary | ICD-10-CM | POA: Diagnosis present

## 2018-02-26 DIAGNOSIS — R1310 Dysphagia, unspecified: Secondary | ICD-10-CM | POA: Diagnosis not present

## 2018-02-26 DIAGNOSIS — E039 Hypothyroidism, unspecified: Secondary | ICD-10-CM | POA: Insufficient documentation

## 2018-02-26 LAB — LACTIC ACID, PLASMA: LACTIC ACID, VENOUS: 1.4 mmol/L (ref 0.5–1.9)

## 2018-02-26 LAB — URINALYSIS, ROUTINE W REFLEX MICROSCOPIC
Bacteria, UA: NONE SEEN
Bilirubin Urine: NEGATIVE
GLUCOSE, UA: NEGATIVE mg/dL
KETONES UR: NEGATIVE mg/dL
Nitrite: NEGATIVE
PH: 5 (ref 5.0–8.0)
PROTEIN: NEGATIVE mg/dL
Specific Gravity, Urine: 1.004 — ABNORMAL LOW (ref 1.005–1.030)

## 2018-02-26 LAB — CBG MONITORING, ED: GLUCOSE-CAPILLARY: 107 mg/dL — AB (ref 70–99)

## 2018-02-26 LAB — COMPREHENSIVE METABOLIC PANEL
ALK PHOS: 74 U/L (ref 38–126)
ALT: <5 U/L (ref 0–44)
AST: 15 U/L (ref 15–41)
Albumin: 3.6 g/dL (ref 3.5–5.0)
Anion gap: 9 (ref 5–15)
BUN: 30 mg/dL — AB (ref 8–23)
CALCIUM: 9 mg/dL (ref 8.9–10.3)
CHLORIDE: 110 mmol/L (ref 98–111)
CO2: 26 mmol/L (ref 22–32)
CREATININE: 1.3 mg/dL — AB (ref 0.44–1.00)
GFR, EST AFRICAN AMERICAN: 44 mL/min — AB (ref >60)
GFR, EST NON AFRICAN AMERICAN: 38 mL/min — AB (ref >60)
Glucose, Bld: 105 mg/dL — ABNORMAL HIGH (ref 70–99)
Potassium: 4 mmol/L (ref 3.5–5.1)
Sodium: 145 mmol/L (ref 135–145)
Total Bilirubin: 0.5 mg/dL (ref 0.3–1.2)
Total Protein: 6.8 g/dL (ref 6.5–8.1)

## 2018-02-26 LAB — CBC
HCT: 30.6 % — ABNORMAL LOW (ref 36.0–46.0)
HEMOGLOBIN: 9.8 g/dL — AB (ref 12.0–15.0)
MCH: 33.2 pg (ref 26.0–34.0)
MCHC: 32 g/dL (ref 30.0–36.0)
MCV: 103.7 fL — ABNORMAL HIGH (ref 78.0–100.0)
Platelets: 203 10*3/uL (ref 150–400)
RBC: 2.95 MIL/uL — AB (ref 3.87–5.11)
RDW: 12.4 % (ref 11.5–15.5)
WBC: 5.4 10*3/uL (ref 4.0–10.5)

## 2018-02-26 MED ORDER — LACTATED RINGERS IV BOLUS
1500.0000 mL | Freq: Once | INTRAVENOUS | Status: AC
  Administered 2018-02-26: 1500 mL via INTRAVENOUS

## 2018-02-26 MED ORDER — SODIUM CHLORIDE 0.9 % IV BOLUS
500.0000 mL | Freq: Once | INTRAVENOUS | Status: AC
  Administered 2018-02-26: 500 mL via INTRAVENOUS

## 2018-02-26 NOTE — ED Provider Notes (Signed)
[facility] EMERGENCY DEPARTMENT Provider Note   CSN: [id number] Arrival date & time: 02/26/18  2009     History   Chief Complaint Chief Complaint  Patient presents with  . Altered Mental Status    HPI [name] is a 83 y.o. female.   Altered Mental Status   This is a new problem. The current episode started 12 to 24 hours ago. The problem has not changed since onset.Associated symptoms include confusion and somnolence. Pertinent negatives include no weakness and no delusions.    Past Medical History:  Diagnosis Date  . Acute cystitis   . Anemia   . Anxiety   . Aortic stenosis, mild 11/18/2013  . Asthma   . Back pain   . Bronchitis, acute   . Carotid artery occlusion   . Cerebrovascular disease   . COPD (chronic obstructive pulmonary disease) ([place])   . Dementia   . Depression   . Diverticulosis of colon   . DJD (degenerative joint disease)   . Fall 07/05/2015  . GERD (gastroesophageal reflux disease)   . History of sudden visual loss   . Hyperlipidemia   . Hypertension   . Hypothyroidism   . Parkinson's disease ([place])   . Pneumonia 2015   hx of x 2   . Pulmonary hypertension, moderate to severe ([place]) 11/19/2014  . Shortness of breath    with exertion   . 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 Transient ischemic attack   . UTI (lower urinary tract infection)    hx of     Patient Active Problem List   Diagnosis Date Noted  . Protein-calorie malnutrition, severe 11/05/2016  . COPD (chronic obstructive pulmonary disease) ([place]) 11/04/2016  . Stroke (cerebrum) ([place]) 11/04/2016  . CKD (chronic kidney disease), stage III ([place]) 11/04/2016  . Acute encephalopathy 10/04/2016  . UTI (lower urinary tract infection) 10/17/2015  . Dementia in Alzheimer's disease 10/17/2015  . Encephalopathy, metabolic 25/11/3974  . Contusion, hip 07/07/2015  . Falls frequently 07/07/2015  . Cervical radiculopathy 07/07/2015  . Anxiety and  depression 07/07/2015  . Gait disturbance 07/07/2015  . Hyperglycemia 07/07/2015  . FTT (failure to thrive) in adult 07/07/2015  . Parkinson disease ([place])   . UTI (urinary tract infection) 07/05/2015  . Chronic neck pain 07/05/2015  . Debility 07/05/2015  . Fatigue 01/19/2015  . Dysuria 01/19/2015  . Chronic restrictive lung disease 01/04/2015  . DOE (dyspnea on exertion) 11/19/2014  . Pulmonary hypertension, moderate to severe ([place]) 11/19/2014  . Irregular heart beats 08/12/2014  . Aftercare following surgery of the circulatory system, [place] 04/19/2014  . Pain of left lower extremity-Knee 04/19/2014  . Weakness 02/11/2014  . Loss of weight 02/11/2014  . Valvular heart disease 01/19/2014  . Pulmonary hypertension ([place]) 01/19/2014  . CAP (community acquired pneumonia) 01/08/2014  . Asthma with acute exacerbation 01/06/2014  . Fall 01/06/2014  . Aortic stenosis, mild 11/18/2013  . Preventative health care 11/18/2013  . Anemia, unspecified 11/18/2013  . Tremor 09/21/2013  . Dyspnea 05/18/2013  . Physical deconditioning 05/18/2013  . OA (osteoarthritis) of knee 11/17/2012  . Hyperlipidemia 11/04/2012  . Occlusion and stenosis of carotid artery without mention of cerebral infarction 03/06/2012  . ASTHMA 10/09/2010  . Sudden visual loss 01/10/2009  . TRANSIENT ISCHEMIC ATTACK 01/10/2009  . CEREBROVASCULAR DISEASE 11/19/2007  . BRONCHITIS, RECURRENT 11/19/2007  . DIVERTICULOSIS OF COLON 11/19/2007  . Hypothyroidism 08/15/2007  . Anxiety state 08/15/2007  .  Depression 08/15/2007  . Essential hypertension 08/15/2007  . GERD 08/15/2007  . Osteoarthritis 08/15/2007  . BACK PAIN, LUMBAR 08/15/2007    Past Surgical History:  Procedure Laterality Date  . CARDIAC CATHETERIZATION  02/18/14  . CARDIOTHORACIC PROCEDURE  01/13/2009   Right   . COLECTOMY    . ENDARTERECTOMY     right carotid endartarectomy - 2010  . FOOT SURGERY     nerve cut between toes   . LEFT AND RIGHT HEART  CATHETERIZATION WITH CORONARY ANGIOGRAM N/A 02/18/2014   Procedure: LEFT AND RIGHT HEART CATHETERIZATION WITH CORONARY ANGIOGRAM;  Surgeon: Larey Dresser, MD;  Location: [facility] CATH LAB;  Service: Cardiovascular;  Laterality: N/A;  . REPLACEMENT TOTAL KNEE     right and left knee  . TEE WITHOUT CARDIOVERSION N/A 02/08/2014   Procedure: TRANSESOPHAGEAL ECHOCARDIOGRAM (TEE);  Surgeon: Larey Dresser, MD;  Location: [facility];  Service: Cardiovascular;  Laterality: N/A;  . TOTAL KNEE ARTHROPLASTY Left 11/17/2012   Procedure: TOTAL KNEE ARTHROPLASTY;  Surgeon: Gearlean Alf, MD;  Location: [facility] ORS;  Service: Orthopedics;  Laterality: Left;  . TUBAL LIGATION    . VAGINAL HYSTERECTOMY       OB History   None      Home Medications    Prior to Admission medications   Medication Sig Start Date End Date Taking? Authorizing Provider  carbidopa-levodopa (SINEMET IR) 25-100 MG tablet Take 2 tablets by mouth 2 (two) times daily. for parkinson's disease   Yes [provider]  CRANBERRY PO Take 4,200 mg by mouth daily.   Yes [provider]  escitalopram (LEXAPRO) 20 MG tablet Take 20 mg by mouth daily.    Yes [provider]  folic acid (FOLVITE) 283 MCG tablet Take 800 mcg by mouth daily.   Yes [provider]  gabapentin (NEURONTIN) 100 MG capsule Take 100 mg by mouth 2 (two) times daily.   Yes [provider]  glycerin adult 2 g suppository Place 1 suppository rectally once a week.   Yes [provider]  levothyroxine (SYNTHROID) 88 MCG tablet Take 88 mcg by mouth daily at 6 (six) AM.    Yes [provider]  LORazepam (ATIVAN) 1 MG tablet Take 1 mg by mouth every 4 (four) hours as needed for anxiety.    Yes [provider]  omeprazole (PRILOSEC) 20 MG capsule Take 20 mg by mouth daily.   Yes [provider]  polyethylene glycol (MIRALAX / GLYCOLAX) packet Take 17 g by mouth daily.   Yes [provider]    QUEtiapine (SEROQUEL) 25 MG tablet Take 25-100 mg by mouth See admin instructions. Take 100 mg every 6 hours. Take 25 mg every day at 1600.   Yes [provider]  QUEtiapine (SEROQUEL) 50 MG tablet Take 75 mg by mouth 2 (two) times daily.   Yes [provider]  Riboflavin (VITAMIN B-2 PO) Take 500 mcg by mouth daily.   Yes [provider]  ADVAIR HFA 230-21 MCG/ACT inhaler Inhale 2 puffs into the lungs 2 (two) times daily. Patient not taking: Reported on 02/26/2018 11/23/14   Biagio Borg, MD    Family History Family History  Problem Relation Age of Onset  . Emphysema Father   . Heart disease Mother   . Hyperlipidemia Mother   . Hypertension Mother   . Coronary artery disease Sister   . Heart disease Sister   . Hypertension Sister   . Varicose Veins Sister   .  AAA (abdominal aortic aneurysm) Sister   . Rheumatic fever Brother        x2  . Heart attack Brother     Social History Social History   Tobacco Use  . Smoking status: Never Smoker  . Smokeless tobacco: Never Used  Substance Use Topics  . Alcohol use: No  . Drug use: No     Allergies   Relafen [nabumetone]; Lipitor [atorvastatin]; Other; and Penicillins   Review of Systems Review of Systems  Unable to perform ROS: Mental status change  Neurological: Negative for weakness.  Psychiatric/Behavioral: Positive for confusion.     Physical Exam Updated Vital Signs BP 119/69   Pulse 76   Temp 97.7 F (36.5 C) (Oral)   Resp (!) 23   Ht 5' (1.524 m)   Wt 73.9 kg (163 lb)   SpO2 100%   BMI 31.83 kg/m   Physical Exam  Constitutional: She appears well-developed and well-nourished.  HENT:  Head: Normocephalic and atraumatic.  Eyes: Right eye exhibits no discharge. Left eye exhibits no discharge.  sunken  Neck: Normal range of motion.  Cardiovascular: Normal rate and regular rhythm.  Pulmonary/Chest: No stridor. No respiratory distress.  Abdominal: Soft. She exhibits no  distension.  Musculoskeletal: Normal range of motion. She exhibits no edema.  Neurological: No cranial nerve deficit or sensory deficit. She exhibits normal muscle tone.  Sleepy but arousable to voice  Skin: Skin is warm and dry.  Nursing note and vitals reviewed.    ED Treatments / Results  Labs (all labs ordered are listed, but only abnormal results are displayed) Labs Reviewed  COMPREHENSIVE METABOLIC PANEL - Abnormal; Notable for the following components:      Result Value   Glucose, Bld 105 (*)    BUN 30 (*)    Creatinine, Ser 1.30 (*)    GFR calc non Af Amer 38 (*)    GFR calc Af Amer 44 (*)    All other components within normal limits  CBC - Abnormal; Notable for the following components:   RBC 2.95 (*)    Hemoglobin 9.8 (*)    HCT 30.6 (*)    MCV 103.7 (*)    All other components within normal limits  URINALYSIS, ROUTINE W REFLEX MICROSCOPIC - Abnormal; Notable for the following components:   Color, Urine STRAW (*)    Specific Gravity, Urine 1.004 (*)    Hgb urine dipstick SMALL (*)    Leukocytes, UA SMALL (*)    All other components within normal limits  CBG MONITORING, ED - Abnormal; Notable for the following components:   Glucose-Capillary 107 (*)    All other components within normal limits  LACTIC ACID, PLASMA  LACTIC ACID, PLASMA  OCCULT BLOOD X 1 CARD TO LAB, STOOL  AMMONIA  TROPONIN I  BLOOD GAS, VENOUS    EKG EKG Interpretation  Date/Time:  Wednesday February 26 2018 20:12:52 EDT Ventricular Rate:  80 PR Interval:    QRS Duration: 95 QT Interval:  394 QTC Calculation: 455 R Axis:   26 Text Interpretation:  Sinus rhythm Low voltage, extremity leads improved TWI since april 2018 Confirmed by Merrily Pew [phone]) on 02/26/2018 10:12:03 PM   Radiology Dg Chest 1 View  Result Date: 02/26/2018 CLINICAL DATA:  Low blood pressure. EXAM: CHEST  1 VIEW COMPARISON:  Chest radiograph 11/07/2016. FINDINGS: Unchanged heart size and mediastinal contours.  Atherosclerosis of the aortic arch. Chronic interstitial prominence, unchanged from prior exam. No pulmonary edema, focal airspace disease,  pleural effusion or pneumothorax. Unchanged osseous structures. IMPRESSION: Chronic interstitial prominence without acute abnormality. Electronically Signed   By: Jeb Levering M.D.   On: 02/26/2018 23:40   Ct Head Wo Contrast  Result Date: 02/26/2018 CLINICAL DATA:  Altered mental status EXAM: CT HEAD WITHOUT CONTRAST TECHNIQUE: Contiguous axial images were obtained from the base of the skull through the vertex without intravenous contrast. COMPARISON:  CT 04/22/2017 FINDINGS: Brain: No acute territorial infarction, hemorrhage, or intracranial mass. Small chronic infarcts in the left cerebellum. Mild small vessel ischemic changes of the white matter. Atrophy. Vascular: No hyperdense vessels.  Carotid vascular calcification Skull: Normal. Negative for fracture or focal lesion. Sinuses/Orbits: No acute finding. Other: None IMPRESSION: 1. No CT evidence for acute intracranial abnormality. 2. Atrophy with mild small vessel ischemic changes of the white matter. Chronic lacunar infarcts in the left cerebellum Electronically Signed   By: Donavan Foil M.D.   On: 02/26/2018 23:17    Procedures Procedures (including critical care time)  Medications Ordered in ED Medications  sodium chloride 0.9 % bolus 500 mL (500 mLs Intravenous New Bag/Given 02/26/18 2152)  lactated ringers bolus 1,500 mL (1,500 mLs Intravenous New Bag/Given 02/26/18 2237)     Initial Impression / Assessment and Plan / ED Course  I have reviewed the triage vital signs and the nursing notes.  Pertinent labs & imaging results that were available during my care of the patient were reviewed by me and considered in my medical decision making (see chart for details).     Altered for unclear period of time. Appears dehydrated. Hypotension initially.  Will start fluids.  eval for causes of altered  LOC. Likely need admitted   Final Clinical Impressions(s) / ED Diagnoses   Final diagnoses:  Low BP    ED Discharge Orders    None       Quanell Loughney, Corene Cornea, MD 02/26/18 2352

## 2018-02-26 NOTE — ED Notes (Signed)
Pt CBG was 107, notified Emmy(RN)

## 2018-02-26 NOTE — ED Triage Notes (Signed)
Pt arrives from home, called out by [facility] for AMS, LSW questionable for yesterday. 1400 slurred speech reported but reported to worsen an hour ago. EMS reports pt is negative for other neuro symptoms, hx of recent UTI and Parkinsons. Unknown if Ox4 at baseline. Reported to be lethargic yesterday.  Breathing treatment given by EMS for bilateral wheezing, 89-90 on RA.  92 on RA on arrival.

## 2018-02-27 ENCOUNTER — Encounter (HOSPITAL_COMMUNITY): Payer: Self-pay | Admitting: Family Medicine

## 2018-02-27 DIAGNOSIS — J449 Chronic obstructive pulmonary disease, unspecified: Secondary | ICD-10-CM

## 2018-02-27 DIAGNOSIS — G9340 Encephalopathy, unspecified: Secondary | ICD-10-CM

## 2018-02-27 DIAGNOSIS — F419 Anxiety disorder, unspecified: Secondary | ICD-10-CM | POA: Diagnosis not present

## 2018-02-27 DIAGNOSIS — G20 Parkinson's disease: Secondary | ICD-10-CM

## 2018-02-27 DIAGNOSIS — D539 Nutritional anemia, unspecified: Secondary | ICD-10-CM

## 2018-02-27 DIAGNOSIS — N183 Chronic kidney disease, stage 3 (moderate): Secondary | ICD-10-CM | POA: Diagnosis not present

## 2018-02-27 DIAGNOSIS — F329 Major depressive disorder, single episode, unspecified: Secondary | ICD-10-CM

## 2018-02-27 DIAGNOSIS — E039 Hypothyroidism, unspecified: Secondary | ICD-10-CM

## 2018-02-27 DIAGNOSIS — N390 Urinary tract infection, site not specified: Secondary | ICD-10-CM | POA: Diagnosis not present

## 2018-02-27 LAB — AMMONIA: Ammonia: 12 umol/L (ref 9–35)

## 2018-02-27 LAB — BASIC METABOLIC PANEL
Anion gap: 8 (ref 5–15)
BUN: 24 mg/dL — ABNORMAL HIGH (ref 8–23)
CALCIUM: 8.9 mg/dL (ref 8.9–10.3)
CO2: 26 mmol/L (ref 22–32)
Chloride: 111 mmol/L (ref 98–111)
Creatinine, Ser: 1.17 mg/dL — ABNORMAL HIGH (ref 0.44–1.00)
GFR calc Af Amer: 50 mL/min — ABNORMAL LOW (ref >60)
GFR calc non Af Amer: 43 mL/min — ABNORMAL LOW (ref >60)
Glucose, Bld: 100 mg/dL — ABNORMAL HIGH (ref 70–99)
Potassium: 4.1 mmol/L (ref 3.5–5.1)
Sodium: 145 mmol/L (ref 135–145)

## 2018-02-27 LAB — GLUCOSE, CAPILLARY: Glucose-Capillary: 76 mg/dL (ref 70–99)

## 2018-02-27 LAB — I-STAT VENOUS BLOOD GAS, ED
Bicarbonate: 27.2 mmol/L (ref 20.0–28.0)
O2 Saturation: 24 %
PH VEN: 7.288 (ref 7.250–7.430)
TCO2: 29 mmol/L (ref 22–32)
pCO2, Ven: 56.8 mmHg (ref 44.0–60.0)
pO2, Ven: 19 mmHg — CL (ref 32.0–45.0)

## 2018-02-27 LAB — TROPONIN I: Troponin I: <0.03 ng/mL (ref <0.03)

## 2018-02-27 LAB — OCCULT BLOOD X 1 CARD TO LAB, STOOL: FECAL OCCULT BLD: NEGATIVE

## 2018-02-27 MED ORDER — SODIUM CHLORIDE 0.9 % IV SOLN
1.0000 g | Freq: Once | INTRAVENOUS | Status: AC
  Administered 2018-02-27: 1 g via INTRAVENOUS
  Filled 2018-02-27: qty 10

## 2018-02-27 MED ORDER — ESCITALOPRAM OXALATE 10 MG PO TABS: 20.0000 mg | ORAL_TABLET | Freq: Every day | ORAL | Status: DC

## 2018-02-27 MED ORDER — PANTOPRAZOLE SODIUM 40 MG PO TBEC
40.0000 mg | DELAYED_RELEASE_TABLET | Freq: Every day | ORAL | Status: DC
  Administered 2018-02-27: 40 mg via ORAL
  Filled 2018-02-27: qty 1

## 2018-02-27 MED ORDER — ACETAMINOPHEN 325 MG PO TABS: 650.0000 mg | ORAL_TABLET | Freq: Four times a day (QID) | ORAL | Status: DC | PRN

## 2018-02-27 MED ORDER — QUETIAPINE FUMARATE 25 MG PO TABS: 25.0000 mg | ORAL_TABLET | ORAL | Status: DC

## 2018-02-27 MED ORDER — LORAZEPAM 1 MG PO TABS: 1.0000 mg | ORAL_TABLET | ORAL | Status: DC | PRN

## 2018-02-27 MED ORDER — ALBUTEROL SULFATE (2.5 MG/3ML) 0.083% IN NEBU: 2.5000 mg | INHALATION_SOLUTION | Freq: Four times a day (QID) | RESPIRATORY_TRACT | Status: DC | PRN

## 2018-02-27 MED ORDER — GABAPENTIN 100 MG PO CAPS
100.0000 mg | ORAL_CAPSULE | Freq: Two times a day (BID) | ORAL | Status: DC
  Administered 2018-02-27: 100 mg via ORAL

## 2018-02-27 MED ORDER — ACETAMINOPHEN 650 MG RE SUPP: 650.0000 mg | Freq: Four times a day (QID) | RECTAL | Status: DC | PRN

## 2018-02-27 MED ORDER — ONDANSETRON HCL 4 MG PO TABS: 4.0000 mg | ORAL_TABLET | Freq: Four times a day (QID) | ORAL | Status: DC | PRN

## 2018-02-27 MED ORDER — LEVOTHYROXINE SODIUM 88 MCG PO TABS
88.0000 ug | ORAL_TABLET | Freq: Every day | ORAL | Status: DC
  Administered 2018-02-27: 88 ug via ORAL
  Filled 2018-02-27: qty 1

## 2018-02-27 MED ORDER — RESOURCE THICKENUP CLEAR PO POWD
ORAL | Status: DC | PRN
  Filled 2018-02-27: qty 125

## 2018-02-27 MED ORDER — MAGNESIUM HYDROXIDE 400 MG/5ML PO SUSP: 30.0000 mL | Freq: Every day | ORAL | Status: DC | PRN

## 2018-02-27 MED ORDER — SODIUM CHLORIDE 0.9% FLUSH: 3.0000 mL | INTRAVENOUS | Status: DC | PRN

## 2018-02-27 MED ORDER — POLYETHYLENE GLYCOL 3350 17 G PO PACK
17.0000 g | PACK | Freq: Every day | ORAL | Status: DC
  Administered 2018-02-27: 17 g via ORAL
  Filled 2018-02-27: qty 1

## 2018-02-27 MED ORDER — HEPARIN SODIUM (PORCINE) 5000 UNIT/ML IJ SOLN
5000.0000 [IU] | Freq: Three times a day (TID) | INTRAMUSCULAR | Status: DC
  Administered 2018-02-27: 5000 [IU] via SUBCUTANEOUS
  Filled 2018-02-27: qty 1

## 2018-02-27 MED ORDER — SODIUM CHLORIDE 0.9 % IV SOLN: 250.0000 mL | INTRAVENOUS | Status: DC | PRN

## 2018-02-27 MED ORDER — SORBITOL 70 % SOLN
30.0000 mL | Freq: Every day | Status: DC | PRN
  Filled 2018-02-27: qty 30

## 2018-02-27 MED ORDER — CARBIDOPA-LEVODOPA 25-100 MG PO TABS
2.0000 | ORAL_TABLET | Freq: Two times a day (BID) | ORAL | Status: DC
  Administered 2018-02-27: 2 via ORAL
  Filled 2018-02-27 (×2): qty 2

## 2018-02-27 MED ORDER — ONDANSETRON HCL 4 MG/2ML IJ SOLN: 4.0000 mg | Freq: Four times a day (QID) | INTRAMUSCULAR | Status: DC | PRN

## 2018-02-27 MED ORDER — QUETIAPINE FUMARATE 100 MG PO TABS
100.0000 mg | ORAL_TABLET | Freq: Four times a day (QID) | ORAL | Status: DC
  Filled 2018-02-27: qty 1

## 2018-02-27 MED ORDER — FOLIC ACID 1 MG PO TABS
1000.0000 ug | ORAL_TABLET | Freq: Every day | ORAL | Status: DC
  Administered 2018-02-27: 1 mg via ORAL
  Filled 2018-02-27: qty 1

## 2018-02-27 MED ORDER — SODIUM CHLORIDE 0.9% FLUSH
3.0000 mL | Freq: Two times a day (BID) | INTRAVENOUS | Status: DC
  Administered 2018-02-27 (×2): 3 mL via INTRAVENOUS

## 2018-02-27 NOTE — Evaluation (Signed)
Physical Therapy Evaluation Patient Details Name: [name] MRN: [id number] DOB: 08-09-1934 Today's Date: 02/27/2018   History of Present Illness  [name] is a 83 y.o. female with medical history significant for Parkinson disease, depression with anxiety, COPD with chronic hypoxic respiratory failure, hypothyroidism, and dysphasia, now presenting to the emergency department for evaluation of somnolence and confusion.   Clinical Impression  Pt admitted with above diagnosis. Pt currently with functional limitations due to the deficits listed below (see PT Problem List). Pt has 24hr assist at  Baseline, she is normally able to amb short distances (bed to nearby w/c); pt is able to transfer with min assist, will benefit from continued PT ina cute setting as well as HHPT;   Pt will benefit from skilled PT to increase their independence and safety with mobility to allow discharge to the venue listed below.       Follow Up Recommendations Home health PT;Supervision for mobility/OOB    Equipment Recommendations  None recommended by PT    Recommendations for Other Services       Precautions / Restrictions Precautions Precautions: Fall      Mobility  Bed Mobility Overal bed mobility: Needs Assistance Bed Mobility: Supine to Sit     Supine to sit: Min assist     General bed mobility comments: assist with trunk  Transfers Overall transfer level: Needs assistance Equipment used: 1 person hand held assist Transfers: Sit to/from Stand;Stand Pivot Transfers Sit to Stand: Min assist Stand pivot transfers: Min assist       General transfer comment: stand pivot x2; assist to balance during stand pivot, pt does not want to attempt with RW, cues for safety  Ambulation/Gait             General Gait Details: NT/unable  Stairs            Wheelchair Mobility    Modified Rankin (Stroke Patients Only)       Balance Overall balance assessment: Needs  assistance Sitting-balance support: Feet supported;No upper extremity supported Sitting balance-Leahy Scale: Fair     Standing balance support: During functional activity                                 Pertinent Vitals/Pain Pain Assessment: Faces Faces Pain Scale: Hurts little more Pain Location: right shoulder pain, incr iwth movement Pain Descriptors / Indicators: Grimacing;Guarding Pain Intervention(s): Limited activity within patient's tolerance;Monitored during session    [place] expects to be discharged to:: Private residence Living Arrangements: Other (Comment) Available Help at Discharge: Available 24 hours/day;Personal care attendant Type of Home: House Home Access: Stairs to enter Entrance Stairs-Rails: None   Home Layout: One level Home Equipment: Wheelchair - manual      Prior Function Level of Independence: Needs assistance   Gait / Transfers Assistance Needed: short distance amb with assist/close supervision; supervision for transfers, occasional assist wtih bed mobility  ADL's / Homemaking Assistance Needed: assist with all ADLs        Hand Dominance        Extremity/Trunk Assessment   Upper Extremity Assessment Upper Extremity Assessment: RUE deficits/detail RUE Deficits / Details: shoulder flexion to 80*, incr pain with motion beyond 80; strength 3+/5 grossly shoulder and elbow    Lower Extremity Assessment Lower Extremity Assessment: Generalized weakness       Communication   Communication: Expressive difficulties  Cognition Arousal/Alertness: Awake/alert Behavior During Therapy: [facility]  for tasks assessed/performed   Area of Impairment: Orientation;Memory;Following commands                 Orientation Level: Disoriented to;Place;Time;Situation   Memory: Decreased short-term memory Following Commands: Follows one step commands consistently;Follows multi-step commands with increased time               General Comments      Exercises     Assessment/Plan    PT Assessment Patient needs continued PT services  PT Problem List Decreased strength;Decreased activity tolerance;Decreased knowledge of use of DME;Decreased mobility;Decreased knowledge of precautions       PT Treatment Interventions DME instruction;Gait training;Functional mobility training;Therapeutic activities;Therapeutic exercise;Patient/family education;Balance training    PT Goals (Current goals can be found in the Care Plan section)  Acute Rehab PT Goals Patient Stated Goal: none stated PT Goal Formulation: With patient Time For Goal Achievement: 03/13/18 Potential to Achieve Goals: Good    Frequency Min 3X/week   Barriers to discharge        Co-evaluation               AM-PAC PT "6 Clicks" Daily Activity  Outcome Measure Difficulty turning over in bed (including adjusting bedclothes, sheets and blankets)?: Unable Difficulty moving from lying on back to sitting on the side of the bed? : Unable Difficulty sitting down on and standing up from a chair with arms (e.g., wheelchair, bedside commode, etc,.)?: Unable Help needed moving to and from a bed to chair (including a wheelchair)?: A Little Help needed walking in hospital room?: A Lot Help needed climbing 3-5 steps with a railing? : A Lot 6 Click Score: 10    End of Session Equipment Utilized During Treatment: Gait belt Activity Tolerance: Patient tolerated treatment well Patient left: in chair;with call bell/phone within reach;with nursing/sitter in room(pt Charity fundraiser)   PT Visit Diagnosis: Unsteadiness on feet (R26.81);Muscle weakness (generalized) (M62.81)    Time: 1610-9604 PT Time Calculation (min) (ACUTE ONLY): 20 min   Charges:   PT Evaluation $PT Eval Low Complexity: 1 Low          Kenyon Ana, PT  02/27/2018   [facility] 02/27/2018, 1:32 PM

## 2018-02-27 NOTE — H&P (Signed)
History and Physical    [name] OQH:[id number] DOB: [birth year] DOA: 02/26/2018  PCP: Patient, No Pcp Per   Patient coming from: Home with caretaker  Chief Complaint: Somnolent, confused   HPI: [name] is a 83 y.o. female with medical history significant for Parkinson disease, depression with anxiety, COPD with chronic hypoxic respiratory failure, hypothyroidism, and dysphasia, now presenting to the emergency department for evaluation of somnolence and confusion.  Patient is accompanied by her caretaker who assist with the history.  Patient's condition has been slowly declining for 2 or 3 years, but she is usually alert during the day and able to carry on a regular conversation, oriented to person place and situation.  At baseline, she requires a wheelchair, but spends most of the day seated and alert.  She apparently had difficulty sleeping and poor appetite for a couple days beginning several days ago, but with no specific complaint and no vomiting or diarrhea.  She continued to remain alert throughout the day and conversant until 2 days ago when she began to have difficulty staying awake during the day.  Now, for the past 2 days, patient has been falling asleep in her chair, confused when she is awake, saying things that do not make sense.  She has had similar symptoms previously that were attributed to UTI.  She was recently referred to outpatient palliative care.  ED Course: Upon arrival to the ED, patient is found to be afebrile, saturating 90%, slightly tachypneic, and with blood pressure 100/80.  EKG features a sinus rhythm with low voltage QRS.  Chest x-ray features chronic interstitial prominence without acute finding.  Noncontrast head CT is negative for acute intracranial abnormality.  Chemistry panel is notable for creatinine 1.30, similar to priors.  CBC features a hemoglobin of 9.8, down from 10.6 in April 2018.  MCV was previously normal and now 103.7.  Lactic acid is  reassuringly normal and urinalysis features small leukocytes, negative nitrites, no bacteria on microscopy, and 11-20 white cells/hpf.  Ammonia level is normal and fecal occult blood testing is negative.  Patient was given 2 L of IV fluids, urine was sent for culture, and she was given a dose of IV Rocephin.  She began to wake up some and is more oriented after the IV fluids, but continues to have difficulty staying awake long enough to complete a sentence.  She will be observed for ongoing evaluation and management of acute encephalopathy.  Review of Systems:  All other systems reviewed and apart from HPI, are negative.  Past Medical History:  Diagnosis Date  . Acute cystitis   . Anemia   . Anxiety   . Aortic stenosis, mild 11/18/2013  . Asthma   . Back pain   . Bronchitis, acute   . Carotid artery occlusion   . Cerebrovascular disease   . COPD (chronic obstructive pulmonary disease) ([place])   . Dementia   . Depression   . Diverticulosis of colon   . DJD (degenerative joint disease)   . Fall 07/05/2015  . GERD (gastroesophageal reflux disease)   . History of sudden visual loss   . Hyperlipidemia   . Hypertension   . Hypothyroidism   . Parkinson's disease ([place])   . Pneumonia 2015   hx of x 2   . Pulmonary hypertension, moderate to severe ([place]) 11/19/2014  . Shortness of breath    with exertion   . 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 Transient  ischemic attack   . UTI (lower urinary tract infection)    hx of     Past Surgical History:  Procedure Laterality Date  . CARDIAC CATHETERIZATION  02/18/14  . CARDIOTHORACIC PROCEDURE  01/13/2009   Right   . COLECTOMY    . ENDARTERECTOMY     right carotid endartarectomy - 2010  . FOOT SURGERY     nerve cut between toes   . LEFT AND RIGHT HEART CATHETERIZATION WITH CORONARY ANGIOGRAM N/A 02/18/2014   Procedure: LEFT AND RIGHT HEART CATHETERIZATION WITH CORONARY ANGIOGRAM;  Surgeon: Larey Dresser, MD;  Location: [facility] CATH  LAB;  Service: Cardiovascular;  Laterality: N/A;  . REPLACEMENT TOTAL KNEE     right and left knee  . TEE WITHOUT CARDIOVERSION N/A 02/08/2014   Procedure: TRANSESOPHAGEAL ECHOCARDIOGRAM (TEE);  Surgeon: Larey Dresser, MD;  Location: [place];  Service: Cardiovascular;  Laterality: N/A;  . TOTAL KNEE ARTHROPLASTY Left 11/17/2012   Procedure: TOTAL KNEE ARTHROPLASTY;  Surgeon: Gearlean Alf, MD;  Location: [facility] ORS;  Service: Orthopedics;  Laterality: Left;  . TUBAL LIGATION    . VAGINAL HYSTERECTOMY       reports that she has never smoked. She has never used smokeless tobacco. She reports that she does not drink alcohol or use drugs.  Allergies  Allergen Reactions  . Relafen [Nabumetone] Diarrhea and Other (See Comments)    GI / Urinary Bleeding  . Lipitor [Atorvastatin] Other (See Comments)    Causes memory loss  . Other Other (See Comments)    "orvail" unknown:  Causes Bloody stool  . Penicillins Hives, Itching, Swelling and Rash    Has patient had a PCN reaction causing immediate rash, facial/tongue/throat swelling, SOB or lightheadedness with hypotension: no Has patient had a PCN reaction causing severe rash involving mucus membranes or skin necrosis: unknown Has patient had a PCN reaction that required hospitalization no Has patient had a PCN reaction occurring within the last 10 years: no If all of the above answers are "NO", then may proceed with Cephalosporin use.     Family History  Problem Relation Age of Onset  . Emphysema Father   . Heart disease Mother   . Hyperlipidemia Mother   . Hypertension Mother   . Coronary artery disease Sister   . Heart disease Sister   . Hypertension Sister   . Varicose Veins Sister   . AAA (abdominal aortic aneurysm) Sister   . Rheumatic fever Brother        x2  . Heart attack Brother      Prior to Admission medications   Medication Sig Start Date End Date Taking? Authorizing Provider  carbidopa-levodopa (SINEMET IR) 25-100  MG tablet Take 2 tablets by mouth 2 (two) times daily. for parkinson's disease   Yes [provider]  CRANBERRY PO Take 4,200 mg by mouth daily.   Yes [provider]  escitalopram (LEXAPRO) 20 MG tablet Take 20 mg by mouth daily.    Yes [provider]  folic acid (FOLVITE) 588 MCG tablet Take 800 mcg by mouth daily.   Yes [provider]  gabapentin (NEURONTIN) 100 MG capsule Take 100 mg by mouth 2 (two) times daily.   Yes [provider]  glycerin adult 2 g suppository Place 1 suppository rectally once a week.   Yes [provider]  levothyroxine (SYNTHROID) 88 MCG tablet Take 88 mcg by mouth daily at 6 (six) AM.    Yes [provider]  LORazepam (  ATIVAN) 1 MG tablet Take 1 mg by mouth every 4 (four) hours as needed for anxiety.    Yes [provider]  omeprazole (PRILOSEC) 20 MG capsule Take 20 mg by mouth daily.   Yes [provider]  polyethylene glycol (MIRALAX / GLYCOLAX) packet Take 17 g by mouth daily.   Yes [provider]  QUEtiapine (SEROQUEL) 25 MG tablet Take 25-100 mg by mouth See admin instructions. Take 100 mg every 6 hours. Take 25 mg every day at 1600.   Yes [provider]  QUEtiapine (SEROQUEL) 50 MG tablet Take 75 mg by mouth 2 (two) times daily.   Yes [provider]  Riboflavin (VITAMIN B-2 PO) Take 500 mcg by mouth daily.   Yes [provider]  ADVAIR HFA 230-21 MCG/ACT inhaler Inhale 2 puffs into the lungs 2 (two) times daily. Patient not taking: Reported on 02/26/2018 11/23/14   Biagio Borg, MD    Physical Exam: Vitals:   02/27/18 0000 02/27/18 0030 02/27/18 0050 02/27/18 0100  BP: (!) 121/56 (!) 118/51 (!) 116/59 (!) 118/51  Pulse: 71 69 73 72  Resp: 20 (!) 22 (!) 21 19  Temp:      TempSrc:      SpO2: 100% 100% 99% 98%  Weight:      Height:          Constitutional: NAD, calm  Eyes: PERTLA, lids and conjunctivae normal ENMT: Mucous  membranes are moist. Posterior pharynx clear of any exudate or lesions.   Neck: normal, supple, no masses, no thyromegaly Respiratory: Breath sounds diminished bilaterally, diffuse wheezes, no crackles. No accessory muscle use.  Cardiovascular: S1 & S2 heard, regular rate and rhythm. No extremity edema.   Abdomen: No distension, no tenderness, soft. Bowel sounds normal.  Musculoskeletal: no clubbing / cyanosis. No joint deformity upper and lower extremities.   Skin: no significant rashes, lesions, ulcers. Poor turgor. Neurologic: No facial asymmetry. Sensation to light touch intact. Moving all extremities equally.  Psychiatric: Somnolent. Easily roused and oriented to person, place, and situation.     Labs on Admission: I have personally reviewed following labs and imaging studies  CBC: Recent Labs  Lab 02/26/18 2144  WBC 5.4  HGB 9.8*  HCT 30.6*  MCV 103.7*  PLT 989   Basic Metabolic Panel: Recent Labs  Lab 02/26/18 2102  NA 145  K 4.0  CL 110  CO2 26  GLUCOSE 105*  BUN 30*  CREATININE 1.30*  CALCIUM 9.0   GFR: Estimated Creatinine Clearance: 31 mL/min (A) (by C-G formula based on SCr of 1.3 mg/dL (H)). Liver Function Tests: Recent Labs  Lab 02/26/18 2102  AST 15  ALT <5  ALKPHOS 74  BILITOT 0.5  PROT 6.8  ALBUMIN 3.6   No results for input(s): LIPASE, AMYLASE in the last 168 hours. Recent Labs  Lab 02/27/18 0045  AMMONIA 12   Coagulation Profile: No results for input(s): INR, PROTIME in the last 168 hours. Cardiac Enzymes: Recent Labs  Lab 02/27/18 0045  TROPONINI <0.03   BNP (last 3 results) No results for input(s): PROBNP in the last 8760 hours. HbA1C: No results for input(s): HGBA1C in the last 72 hours. CBG: Recent Labs  Lab 02/26/18 2138  GLUCAP 107*   Lipid Profile: No results for input(s): CHOL, HDL, LDLCALC, TRIG, CHOLHDL, LDLDIRECT in the last 72 hours. Thyroid Function Tests: No results for input(s): TSH, T4TOTAL, FREET4,  T3FREE, THYROIDAB in the last 72 hours. Anemia Panel: No results  for input(s): VITAMINB12, FOLATE, FERRITIN, TIBC, IRON, RETICCTPCT in the last 72 hours. Urine analysis:    Component Value Date/Time   COLORURINE STRAW (A) 02/26/2018 2250   APPEARANCEUR CLEAR 02/26/2018 2250   LABSPEC 1.004 (L) 02/26/2018 2250   PHURINE 5.0 02/26/2018 2250   GLUCOSEU NEGATIVE 02/26/2018 2250   GLUCOSEU NEGATIVE 07/05/2015 1724   HGBUR SMALL (A) 02/26/2018 2250   BILIRUBINUR NEGATIVE 02/26/2018 2250   KETONESUR NEGATIVE 02/26/2018 2250   PROTEINUR NEGATIVE 02/26/2018 2250   UROBILINOGEN 0.2 07/05/2015 1724   NITRITE NEGATIVE 02/26/2018 2250   LEUKOCYTESUR SMALL (A) 02/26/2018 2250   Sepsis Labs: @LABRCNTIP (procalcitonin:4,lacticidven:4) )No results found for this or any previous visit (from the past 240 hour(s)).   Radiological Exams on Admission: Dg Chest 1 View  Result Date: 02/26/2018 CLINICAL DATA:  Low blood pressure. EXAM: CHEST  1 VIEW COMPARISON:  Chest radiograph 11/07/2016. FINDINGS: Unchanged heart size and mediastinal contours. Atherosclerosis of the aortic arch. Chronic interstitial prominence, unchanged from prior exam. No pulmonary edema, focal airspace disease, pleural effusion or pneumothorax. Unchanged osseous structures. IMPRESSION: Chronic interstitial prominence without acute abnormality. Electronically Signed   By: Jeb Levering M.D.   On: 02/26/2018 23:40   Ct Head Wo Contrast  Result Date: 02/26/2018 CLINICAL DATA:  Altered mental status EXAM: CT HEAD WITHOUT CONTRAST TECHNIQUE: Contiguous axial images were obtained from the base of the skull through the vertex without intravenous contrast. COMPARISON:  CT 04/22/2017 FINDINGS: Brain: No acute territorial infarction, hemorrhage, or intracranial mass. Small chronic infarcts in the left cerebellum. Mild small vessel ischemic changes of the white matter. Atrophy. Vascular: No hyperdense vessels.  Carotid vascular calcification  Skull: Normal. Negative for fracture or focal lesion. Sinuses/Orbits: No acute finding. Other: None IMPRESSION: 1. No CT evidence for acute intracranial abnormality. 2. Atrophy with mild small vessel ischemic changes of the white matter. Chronic lacunar infarcts in the left cerebellum Electronically Signed   By: Donavan Foil M.D.   On: 02/26/2018 23:17    EKG: Independently reviewed. Sinus rhythm, low-voltage QRS.   Assessment/Plan  1. Acute encephalopathy  - Presents with 2 days of lethargy and confusion with incomprehensible speech  - No focal findings identified, CT negative for acute intracranial abnormality, ammonia wnl, UA not suggestive of UTI (sample sent for culture and dose of Rocephin given in ED)  - Suspect this is most likely secondary to dehydration poor sleep recently; she has improved in ED with IVF hydration but still has difficulty staying awake long enough to complete a sentence  - Check TSH, RPR, B12, and folate levels, continue supportive care    2. Macrocytic anemia  - Hgb is 9.8 on admission, down from 10.6 a year ago  - MCV is 103.7, previously normal  - No gross bleeding, FOBT negative  - Check B12 and folate in light of AMS   3. Parkinson disease - Follows with neurology and managed with Sinemet  - Continue Sinemet   4. CKD stage III  - SCr is 1.30 on admission, similar to priors  - Renally-dose medications, avoid nephrotoxins   5. Hypothyroidism  - Check TSH given the presentation  - Continue Synthroid    6. Depression with anxiety  - Managed with Lexapro, Seroquel, prn Ativan   - Hold sedating medications initially   7. COPD with chronic hypoxic respiratory failure  - No recent cough or dyspnea reported, but significant wheezing on admission  - Continue supplemental O2, continue prn nebs    8. Dysphagia  -  Continue dysphagia 1 diet with thickened liquids    DVT prophylaxis: sq heparin  Code Status: DNR  Family Communication: Caretaker updated  at bedside Consults called: None Admission status: Observation    Vianne Bulls, MD [facility] Pager [phone]  If 7PM-7AM, please contact night-coverage [email or web address] Password [email or web address]  02/27/2018, 2:51 AM

## 2018-02-27 NOTE — Discharge Summary (Signed)
Physician Discharge Summary  [name]:[id number] DOB: February 27, 1935 DOA: 02/26/2018  PCP: [name], No Pcp Per  Admit date: 02/26/2018 Discharge date: 02/27/2018  Admitted From: Home Disposition:  Home  Recommendations for Outpatient Follow-up:  1. Follow up with PCP in 1-2 weeks 2. Follow up with Palliative Care  Discharge Condition:Improved CODE STATUS:DNR Diet recommendation: Dysphagia 1 with honey thick liquids   Brief/Interim Summary: 83 y.o. female with medical history significant for Parkinson disease, depression with anxiety, COPD with chronic hypoxic respiratory failure, hypothyroidism, and dysphasia, now presenting to the emergency department for evaluation of somnolence and confusion.  [name] is accompanied by her caretaker who assist with the history.  [name]'s condition has been slowly declining for 2 or 3 years, but she is usually alert during the day and able to carry on a regular conversation, oriented to person place and situation.  At baseline, she requires a wheelchair, but spends most of the day seated and alert.  She apparently had difficulty sleeping and poor appetite for a couple days beginning several days ago, but with no specific complaint and no vomiting or diarrhea.  She continued to remain alert throughout the day and conversant until 2 days ago when she began to have difficulty staying awake during the day.  Now, for the past 2 days, [name] has been falling asleep in her chair, confused when she is awake, saying things that do not make sense.  She has had similar symptoms previously that were attributed to UTI.  She was recently referred to outpatient palliative care.  ED Course: Upon arrival to the ED, [name] is found to be afebrile, saturating 90%, slightly tachypneic, and with blood pressure 100/80.  EKG features a sinus rhythm with low voltage QRS.  Chest x-ray features chronic interstitial prominence without acute finding.  Noncontrast head CT is negative  for acute intracranial abnormality.  Chemistry panel is notable for creatinine 1.30, similar to priors.  CBC features a hemoglobin of 9.8, down from 10.6 in April 2018.  MCV was previously normal and now 103.7.  Lactic acid is reassuringly normal and urinalysis features small leukocytes, negative nitrites, no bacteria on microscopy, and 11-20 white cells/hpf.  Ammonia level is normal and fecal occult blood testing is negative.  [name] was given 2 L of IV fluids, urine was sent for culture, and she was given a dose of IV Rocephin.  She began to wake up some and is more oriented after the IV fluids, but continues to have difficulty staying awake long enough to complete a sentence.  She will be observed for ongoing evaluation and management of acute encephalopathy.  1. Acute encephalopathy  - Presents with 2 days of lethargy and confusion with incomprehensible speech  - No focal findings identified, CT negative for acute intracranial abnormality, ammonia wnl, UA not suggestive of UTI - Suspect this is most likely secondary to dehydration vs possible polypharmacy as pt has been on scheduled seroquel prior to admit - Much improved with IVF overnight and seroquel being held  2. Macrocytic anemia  - Hgb is 9.8 on admission, down from 10.6 a year ago  - MCV is 103.7, previously normal  - No gross bleeding, FOBT negative   3. Parkinson disease - Follows with neurology and managed with Sinemet  - Continue Sinemet   4. CKD stage III  - SCr is 1.30 on admission, similar to priors  - Renally-dose medications, avoid nephrotoxins   5. Hypothyroidism  - Check TSH given the presentation  - Continue  Synthroid    6. Depression with anxiety  - Managed with Lexapro, Seroquel, prn Ativan   - Hold sedating medications initially per above  7. COPD with chronic hypoxic respiratory failure  - No recent cough or dyspnea reported, but significant wheezing on admission  - stable and on room air at  present  8. Dysphagia  - Continue dysphagia 1 diet with thickened liquids    Discharge Diagnoses:  Principal Problem:   Acute encephalopathy Active Problems:   Hypothyroidism   Macrocytic anemia   Anxiety and depression   Parkinson disease (HCC)   COPD (chronic obstructive pulmonary disease) (HCC)   CKD (chronic kidney disease), stage III (HCC)    Discharge Instructions   Allergies as of 02/27/2018      Reactions   Relafen [nabumetone] Diarrhea, Other (See Comments)   GI / Urinary Bleeding   Lipitor [atorvastatin] Other (See Comments)   Causes memory loss   Other Other (See Comments)   "orvail" unknown:  Causes Bloody stool   Penicillins Hives, Itching, Swelling, Rash   Has [name] had a PCN reaction causing immediate rash, facial/tongue/throat swelling, SOB or lightheadedness with hypotension: no Has [name] had a PCN reaction causing severe rash involving mucus membranes or skin necrosis: unknown Has [name] had a PCN reaction that required hospitalization no Has [name] had a PCN reaction occurring within the last 10 years: no If all of the above answers are "NO", then may proceed with Cephalosporin use.      Medication List    STOP taking these medications   QUEtiapine 25 MG tablet Commonly known as:  SEROQUEL   QUEtiapine 50 MG tablet Commonly known as:  SEROQUEL     TAKE these medications   ADVAIR HFA 230-21 MCG/ACT inhaler Generic drug:  fluticasone-salmeterol Inhale 2 puffs into the lungs 2 (two) times daily.   carbidopa-levodopa 25-100 MG tablet Commonly known as:  SINEMET IR Take 2 tablets by mouth 2 (two) times daily. for parkinson's disease   CRANBERRY PO Take 4,200 mg by mouth daily.   escitalopram 20 MG tablet Commonly known as:  LEXAPRO Take 20 mg by mouth daily.   folic acid 678 MCG tablet Commonly known as:  FOLVITE Take 800 mcg by mouth daily.   gabapentin 100 MG capsule Commonly known as:  NEURONTIN Take 100 mg by mouth 2 (two)  times daily.   glycerin adult 2 g suppository Place 1 suppository rectally once a week.   LORazepam 1 MG tablet Commonly known as:  ATIVAN Take 1 mg by mouth every 4 (four) hours as needed for anxiety.   omeprazole 20 MG capsule Commonly known as:  PRILOSEC Take 20 mg by mouth daily.   polyethylene glycol packet Commonly known as:  MIRALAX / GLYCOLAX Take 17 g by mouth daily.   SYNTHROID 88 MCG tablet Generic drug:  levothyroxine Take 88 mcg by mouth daily at 6 (six) AM.   VITAMIN B-2 PO Take 500 mcg by mouth daily.      Follow-up Information    Follow up with PCP in 1-2 weeks. Schedule an appointment as soon as possible for a visit.          Allergies  Allergen Reactions  . Relafen [Nabumetone] Diarrhea and Other (See Comments)    GI / Urinary Bleeding  . Lipitor [Atorvastatin] Other (See Comments)    Causes memory loss  . Other Other (See Comments)    "orvail" unknown:  Causes Bloody stool  . Penicillins Hives, Itching, Swelling  and Rash    Has [name] had a PCN reaction causing immediate rash, facial/tongue/throat swelling, SOB or lightheadedness with hypotension: no Has [name] had a PCN reaction causing severe rash involving mucus membranes or skin necrosis: unknown Has [name] had a PCN reaction that required hospitalization no Has [name] had a PCN reaction occurring within the last 10 years: no If all of the above answers are "NO", then may proceed with Cephalosporin use.     Procedures/Studies: Dg Chest 1 View  Result Date: 02/26/2018 CLINICAL DATA:  Low blood pressure. EXAM: CHEST  1 VIEW COMPARISON:  Chest radiograph 11/07/2016. FINDINGS: Unchanged heart size and mediastinal contours. Atherosclerosis of the aortic arch. Chronic interstitial prominence, unchanged from prior exam. No pulmonary edema, focal airspace disease, pleural effusion or pneumothorax. Unchanged osseous structures. IMPRESSION: Chronic interstitial prominence without acute abnormality.  Electronically Signed   By: Jeb Levering M.D.   On: 02/26/2018 23:40   Ct Head Wo Contrast  Result Date: 02/26/2018 CLINICAL DATA:  Altered mental status EXAM: CT HEAD WITHOUT CONTRAST TECHNIQUE: Contiguous axial images were obtained from the base of the skull through the vertex without intravenous contrast. COMPARISON:  CT 04/22/2017 FINDINGS: Brain: No acute territorial infarction, hemorrhage, or intracranial mass. Small chronic infarcts in the left cerebellum. Mild small vessel ischemic changes of the white matter. Atrophy. Vascular: No hyperdense vessels.  Carotid vascular calcification Skull: Normal. Negative for fracture or focal lesion. Sinuses/Orbits: No acute finding. Other: None IMPRESSION: 1. No CT evidence for acute intracranial abnormality. 2. Atrophy with mild small vessel ischemic changes of the white matter. Chronic lacunar infarcts in the left cerebellum Electronically Signed   By: Donavan Foil M.D.   On: 02/26/2018 23:17    Subjective: Eager to be discharged today  Discharge Exam: Vitals:   02/27/18 0315 02/27/18 0436  BP: (!) 117/48 (!) 117/58  Pulse: 68 67  Resp: 20 18  Temp:  97.6 F (36.4 C)  SpO2: 99% 100%   Vitals:   02/27/18 0145 02/27/18 0230 02/27/18 0315 02/27/18 0436  BP: (!) 136/58 (!) 126/57 (!) 117/48 (!) 117/58  Pulse: 72 67 68 67  Resp: 19 19 20 18   Temp:    97.6 F (36.4 C)  TempSrc:    Oral  SpO2: 100% 100% 99% 100%  Weight:      Height:        General: Pt is alert, awake, not in acute distress Cardiovascular: RRR, S1/S2 +, no rubs, no gallops Respiratory: CTA bilaterally, no wheezing, no rhonchi Abdominal: Soft, NT, ND, bowel sounds + Extremities: no edema, no cyanosis   The results of significant diagnostics from this hospitalization (including imaging, microbiology, ancillary and laboratory) are listed below for reference.     Microbiology: No results found for this or any previous visit (from the past 240 hour(s)).    Labs: BNP (last 3 results) No results for input(s): BNP in the last 8760 hours. Basic Metabolic Panel: Recent Labs  Lab 02/26/18 2102 02/27/18 0420  NA 145 145  K 4.0 4.1  CL 110 111  CO2 26 26  GLUCOSE 105* 100*  BUN 30* 24*  CREATININE 1.30* 1.17*  CALCIUM 9.0 8.9   Liver Function Tests: Recent Labs  Lab 02/26/18 2102  AST 15  ALT <5  ALKPHOS 74  BILITOT 0.5  PROT 6.8  ALBUMIN 3.6   No results for input(s): LIPASE, AMYLASE in the last 168 hours. Recent Labs  Lab 02/27/18 0045  AMMONIA 12   CBC: Recent Labs  Lab 02/26/18 2144  WBC 5.4  HGB 9.8*  HCT 30.6*  MCV 103.7*  PLT 203   Cardiac Enzymes: Recent Labs  Lab 02/27/18 0045  TROPONINI <0.03   BNP: Invalid input(s): POCBNP CBG: Recent Labs  Lab 02/26/18 2138 02/27/18 0804  GLUCAP 107* 76   D-Dimer No results for input(s): DDIMER in the last 72 hours. Hgb A1c No results for input(s): HGBA1C in the last 72 hours. Lipid Profile No results for input(s): CHOL, HDL, LDLCALC, TRIG, CHOLHDL, LDLDIRECT in the last 72 hours. Thyroid function studies No results for input(s): TSH, T4TOTAL, T3FREE, THYROIDAB in the last 72 hours.  Invalid input(s): FREET3 Anemia work up No results for input(s): VITAMINB12, FOLATE, FERRITIN, TIBC, IRON, RETICCTPCT in the last 72 hours. Urinalysis    Component Value Date/Time   COLORURINE STRAW (A) 02/26/2018 2250   APPEARANCEUR CLEAR 02/26/2018 2250   LABSPEC 1.004 (L) 02/26/2018 2250   PHURINE 5.0 02/26/2018 2250   GLUCOSEU NEGATIVE 02/26/2018 2250   GLUCOSEU NEGATIVE 07/05/2015 1724   HGBUR SMALL (A) 02/26/2018 2250   BILIRUBINUR NEGATIVE 02/26/2018 2250   KETONESUR NEGATIVE 02/26/2018 2250   PROTEINUR NEGATIVE 02/26/2018 2250   UROBILINOGEN 0.2 07/05/2015 1724   NITRITE NEGATIVE 02/26/2018 2250   LEUKOCYTESUR SMALL (A) 02/26/2018 2250   Sepsis Labs Invalid input(s): PROCALCITONIN,  WBC,  LACTICIDVEN Microbiology No results found for this or any  previous visit (from the past 240 hour(s)).  Time spent: 48min  SIGNED:   Marylu Lund, MD  [facility] 02/27/2018, 1:08 PM  If 7PM-7AM, please contact night-coverage [email or web address] Password [email or web address]

## 2018-02-27 NOTE — ED Provider Notes (Signed)
Care assumed from previous provider Dr. Dayna Barker. Please see their note for further details to include full history and physical. To summarize in short pt is a 83 year old female with altered mental status. Case discussed, plan agreed upon.   At time of care handoff was awaiting additional blood work and imaging.  Prior provider felt that patient was dehydrated and gave IV fluids.  Patient blood work reassuring.  Ammonia was normal.  Patient is not acidotic.  Troponin was negative.  Occult blood was negative.  UA has leukocytes but no bacteria and is nitrite negative however family states that she has a history of UTIs that causes altered mental status.  Patient's mental status has improved with fluid.  Will start patient on Rocephin to cover for UTI and admit for further work-up.  Talk with Dr. Myna Hidalgo with hospital medicine who agrees to admission will see patient in the ED and place admission orders.  Patient updated on plan of care.  Remains hemodynamic stable this time.       Doristine Devoid, PA-C 02/27/18 0300    Merrily Pew, MD 02/27/18 [phone]

## 2018-02-27 NOTE — Progress Notes (Signed)
Nsg Discharge Note  Admit Date:  02/26/2018 Discharge date: 02/27/2018   [name] to be D/C'd Home per MD order.  AVS completed.  Copy for chart, and copy for patient signed, and dated. Patient/caregiver able to verbalize understanding.  Discharge Medication: Allergies as of 02/27/2018      Reactions   Relafen [nabumetone] Diarrhea, Other (See Comments)   GI / Urinary Bleeding   Lipitor [atorvastatin] Other (See Comments)   Causes memory loss   Other Other (See Comments)   "orvail" unknown:  Causes Bloody stool   Penicillins Hives, Itching, Swelling, Rash   Has patient had a PCN reaction causing immediate rash, facial/tongue/throat swelling, SOB or lightheadedness with hypotension: no Has patient had a PCN reaction causing severe rash involving mucus membranes or skin necrosis: unknown Has patient had a PCN reaction that required hospitalization no Has patient had a PCN reaction occurring within the last 10 years: no If all of the above answers are "NO", then may proceed with Cephalosporin use.      Medication List    STOP taking these medications   QUEtiapine 25 MG tablet Commonly known as:  SEROQUEL   QUEtiapine 50 MG tablet Commonly known as:  SEROQUEL     TAKE these medications   ADVAIR HFA 230-21 MCG/ACT inhaler Generic drug:  fluticasone-salmeterol Inhale 2 puffs into the lungs 2 (two) times daily.   carbidopa-levodopa 25-100 MG tablet Commonly known as:  SINEMET IR Take 2 tablets by mouth 2 (two) times daily. for parkinson's disease   CRANBERRY PO Take 4,200 mg by mouth daily.   escitalopram 20 MG tablet Commonly known as:  LEXAPRO Take 20 mg by mouth daily.   folic acid 947 MCG tablet Commonly known as:  FOLVITE Take 800 mcg by mouth daily.   gabapentin 100 MG capsule Commonly known as:  NEURONTIN Take 100 mg by mouth 2 (two) times daily.   glycerin adult 2 g suppository Place 1 suppository rectally once a week.   LORazepam 1 MG tablet Commonly  known as:  ATIVAN Take 1 mg by mouth every 4 (four) hours as needed for anxiety.   omeprazole 20 MG capsule Commonly known as:  PRILOSEC Take 20 mg by mouth daily.   polyethylene glycol packet Commonly known as:  MIRALAX / GLYCOLAX Take 17 g by mouth daily.   SYNTHROID 88 MCG tablet Generic drug:  levothyroxine Take 88 mcg by mouth daily at 6 (six) AM.   VITAMIN B-2 PO Take 500 mcg by mouth daily.       Discharge Assessment: Vitals:   02/27/18 0436 02/27/18 1348  BP: (!) 117/58 (!) 133/56  Pulse: 67 79  Resp: 18 18  Temp: 97.6 F (36.4 C) 99.1 F (37.3 C)  SpO2: 100% 100%  Skin clean, dry and intact without evidence of skin break down, no evidence of skin tears noted. IV catheter discontinued intact. Site without signs and symptoms of complications - no redness or edema noted at insertion site, patient denies c/o pain - only slight tenderness at site.  Dressing with slight pressure applied.  D/c Instructions-Education: Discharge instructions given to patient/ Caregiver with verbalized understanding. D/c education completed with patient/family including follow up instructions, medication list, d/c activities limitations if indicated, with other d/c instructions as indicated by MD - patient able to verbalize understanding, all questions fully answered. Patient instructed to return to ED, call 911, or call MD for any changes in condition.    Angel N Mairany Bruno, RN 02/27/2018 1:51 PM

## 2018-02-27 NOTE — Progress Notes (Signed)
Assumed care of pt. VSS and no distress noted on arrival at the unit. Skin intact. Skin assessed with Jeralyn Bennett RN.

## 2018-02-27 NOTE — ED Notes (Signed)
Pt's mentation improved, oriented x4

## 2018-03-01 LAB — URINE CULTURE

## 2018-03-19 ENCOUNTER — Other Ambulatory Visit: Payer: Medicare Other | Admitting: Internal Medicine

## 2018-04-16 ENCOUNTER — Encounter: Payer: Medicare Other | Admitting: Internal Medicine

## 2018-04-16 DIAGNOSIS — Z515 Encounter for palliative care: Secondary | ICD-10-CM

## 2018-05-03 NOTE — Progress Notes (Signed)
Disregard note.  Gonzella Lex, NP

## 2018-05-21 ENCOUNTER — Other Ambulatory Visit: Payer: Medicare Other | Admitting: Internal Medicine

## 2018-06-01 NOTE — Progress Notes (Unsigned)
PALLIATIVE CARE CONSULT VISIT   PATIENT NAMEReet [name] DOB: [birth year] MRN: [id number]  PRIMARY CARE PROVIDER:   Patient, No Pcp Per  REFERRING PROVIDER:  No referring provider defined for this encounter.  RESPONSIBLE PARTY:     ASSESSMENT:        RECOMMENDATIONS and PLAN:  1.  I spent *** minutes providing this consultation,  from *** to ***. More than 50% of the time in this consultation was spent coordinating communication.   HISTORY OF PRESENT ILLNESS:  [name] is a 83 y.o. year old female with multiple medical problems including ***. Palliative Care was asked to help address goals of care.   CODE STATUS:   PPS: 0% HOSPICE ELIGIBILITY/DIAGNOSIS: TBD  PAST MEDICAL HISTORY:  Past Medical History:  Diagnosis Date  . Acute cystitis   . Anemia   . Anxiety   . Aortic stenosis, mild 11/18/2013  . Asthma   . Back pain   . Bronchitis, acute   . Carotid artery occlusion   . Cerebrovascular disease   . COPD (chronic obstructive pulmonary disease) ([place])   . Dementia   . Depression   . Diverticulosis of colon   . DJD (degenerative joint disease)   . Fall 07/05/2015  . GERD (gastroesophageal reflux disease)   . History of sudden visual loss   . Hyperlipidemia   . Hypertension   . Hypothyroidism   . Parkinson's disease ([place])   . Pneumonia 2015   hx of x 2   . Pulmonary hypertension, moderate to severe ([place]) 11/19/2014  . Shortness of breath    with exertion   . 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 Transient ischemic attack   . UTI (lower urinary tract infection)    hx of     SOCIAL HX:  Social History   Tobacco Use  . Smoking status: Never Smoker  . Smokeless tobacco: Never Used  Substance Use Topics  . Alcohol use: No    ALLERGIES:  Allergies  Allergen Reactions  . Relafen [Nabumetone] Diarrhea and Other (See Comments)    GI / Urinary Bleeding  . Lipitor [Atorvastatin] Other (See Comments)    Causes memory loss  . Other  Other (See Comments)    "orvail" unknown:  Causes Bloody stool  . Penicillins Hives, Itching, Swelling and Rash    Has patient had a PCN reaction causing immediate rash, facial/tongue/throat swelling, SOB or lightheadedness with hypotension: no Has patient had a PCN reaction causing severe rash involving mucus membranes or skin necrosis: unknown Has patient had a PCN reaction that required hospitalization no Has patient had a PCN reaction occurring within the last 10 years: no If all of the above answers are "NO", then may proceed with Cephalosporin use.      PERTINENT MEDICATIONS:  Outpatient Encounter Medications as of 05/21/2018  Medication Sig  . ADVAIR HFA 230-21 MCG/ACT inhaler Inhale 2 puffs into the lungs 2 (two) times daily. (Patient not taking: Reported on 02/26/2018)  . carbidopa-levodopa (SINEMET IR) 25-100 MG tablet Take 2 tablets by mouth 2 (two) times daily. for parkinson's disease  . CRANBERRY PO Take 4,200 mg by mouth daily.  Marland Kitchen escitalopram (LEXAPRO) 20 MG tablet Take 20 mg by mouth daily.   . folic acid (FOLVITE) 518 MCG tablet Take 800 mcg by mouth daily.  Marland Kitchen gabapentin (NEURONTIN) 100 MG capsule Take 100 mg by mouth 2 (two) times daily.  Marland Kitchen glycerin adult 2 g  suppository Place 1 suppository rectally once a week.  . levothyroxine (SYNTHROID) 88 MCG tablet Take 88 mcg by mouth daily at 6 (six) AM.   . LORazepam (ATIVAN) 1 MG tablet Take 1 mg by mouth every 4 (four) hours as needed for anxiety.   Marland Kitchen omeprazole (PRILOSEC) 20 MG capsule Take 20 mg by mouth daily.  . polyethylene glycol (MIRALAX / GLYCOLAX) packet Take 17 g by mouth daily.  . Riboflavin (VITAMIN B-2 PO) Take 500 mcg by mouth daily.   No facility-administered encounter medications on file as of 05/21/2018.     PHYSICAL EXAM:   General: NAD, frail appearing, thin Cardiovascular: regular rate and rhythm Pulmonary: clear ant fields Abdomen: soft, nontender, + bowel sounds GU: no suprapubic  tenderness Extremities: no edema, no joint deformities Skin: no rashes Neurological: Weakness but otherwise nonfocal  Gonzella Lex, NP

## 2018-06-18 ENCOUNTER — Other Ambulatory Visit: Payer: Medicare Other | Admitting: Internal Medicine

## 2018-07-31 ENCOUNTER — Ambulatory Visit
Admission: RE | Admit: 2018-07-31 | Discharge: 2018-07-31 | Disposition: A | Discharge status: Home / Self Care | Payer: Medicare Other | Source: Ambulatory Visit | Attending: Nurse Practitioner | Admitting: Nurse Practitioner

## 2018-07-31 ENCOUNTER — Other Ambulatory Visit: Payer: Self-pay | Admitting: Nurse Practitioner

## 2018-07-31 DIAGNOSIS — J690 Pneumonitis due to inhalation of food and vomit: Secondary | ICD-10-CM

## 2018-07-31 DIAGNOSIS — R05 Cough: Secondary | ICD-10-CM

## 2018-07-31 DIAGNOSIS — R059 Cough, unspecified: Secondary | ICD-10-CM

## 2019-03-25 ENCOUNTER — Ambulatory Visit
Admission: RE | Admit: 2019-03-25 | Discharge: 2019-03-25 | Disposition: A | Discharge status: Home / Self Care | Payer: Medicare Other | Source: Ambulatory Visit | Attending: Family Medicine | Admitting: Family Medicine

## 2019-03-25 ENCOUNTER — Other Ambulatory Visit: Payer: Self-pay | Admitting: Family Medicine

## 2019-03-25 DIAGNOSIS — R059 Cough, unspecified: Secondary | ICD-10-CM

## 2019-03-25 DIAGNOSIS — R05 Cough: Secondary | ICD-10-CM

## 2019-10-20 ENCOUNTER — Other Ambulatory Visit: Payer: Self-pay

## 2019-10-20 ENCOUNTER — Emergency Department (HOSPITAL_COMMUNITY): Payer: Medicare Other

## 2019-10-20 ENCOUNTER — Encounter (HOSPITAL_COMMUNITY): Payer: Self-pay | Admitting: *Deleted

## 2019-10-20 ENCOUNTER — Inpatient Hospital Stay (HOSPITAL_COMMUNITY)
Admission: EM | Admit: 2019-10-20 | Discharge: 2019-10-23 | DRG: 389 | Disposition: A | Discharge status: Home / Self Care | Payer: Medicare Other | Attending: Internal Medicine | Admitting: Internal Medicine

## 2019-10-20 DIAGNOSIS — R627 Adult failure to thrive: Secondary | ICD-10-CM

## 2019-10-20 DIAGNOSIS — R531 Weakness: Secondary | ICD-10-CM

## 2019-10-20 DIAGNOSIS — N179 Acute kidney failure, unspecified: Secondary | ICD-10-CM | POA: Diagnosis not present

## 2019-10-20 DIAGNOSIS — Z20822 Contact with and (suspected) exposure to covid-19: Secondary | ICD-10-CM | POA: Diagnosis present

## 2019-10-20 DIAGNOSIS — Z79890 Hormone replacement therapy (postmenopausal): Secondary | ICD-10-CM

## 2019-10-20 DIAGNOSIS — G309 Alzheimer's disease, unspecified: Secondary | ICD-10-CM

## 2019-10-20 DIAGNOSIS — Z96653 Presence of artificial knee joint, bilateral: Secondary | ICD-10-CM | POA: Diagnosis present

## 2019-10-20 DIAGNOSIS — R1110 Vomiting, unspecified: Secondary | ICD-10-CM | POA: Diagnosis present

## 2019-10-20 DIAGNOSIS — K5641 Fecal impaction: Principal | ICD-10-CM | POA: Diagnosis present

## 2019-10-20 DIAGNOSIS — E785 Hyperlipidemia, unspecified: Secondary | ICD-10-CM | POA: Diagnosis present

## 2019-10-20 DIAGNOSIS — F0280 Dementia in other diseases classified elsewhere without behavioral disturbance: Secondary | ICD-10-CM | POA: Diagnosis present

## 2019-10-20 DIAGNOSIS — F0390 Unspecified dementia without behavioral disturbance: Secondary | ICD-10-CM | POA: Diagnosis present

## 2019-10-20 DIAGNOSIS — G20A1 Parkinson's disease without dyskinesia, without mention of fluctuations: Secondary | ICD-10-CM | POA: Diagnosis present

## 2019-10-20 DIAGNOSIS — Z79891 Long term (current) use of opiate analgesic: Secondary | ICD-10-CM

## 2019-10-20 DIAGNOSIS — Z9049 Acquired absence of other specified parts of digestive tract: Secondary | ICD-10-CM

## 2019-10-20 DIAGNOSIS — F329 Major depressive disorder, single episode, unspecified: Secondary | ICD-10-CM | POA: Diagnosis present

## 2019-10-20 DIAGNOSIS — I272 Pulmonary hypertension, unspecified: Secondary | ICD-10-CM | POA: Diagnosis present

## 2019-10-20 DIAGNOSIS — N390 Urinary tract infection, site not specified: Secondary | ICD-10-CM | POA: Diagnosis not present

## 2019-10-20 DIAGNOSIS — E860 Dehydration: Secondary | ICD-10-CM

## 2019-10-20 DIAGNOSIS — G219 Secondary parkinsonism, unspecified: Secondary | ICD-10-CM | POA: Diagnosis present

## 2019-10-20 DIAGNOSIS — H5462 Unqualified visual loss, left eye, normal vision right eye: Secondary | ICD-10-CM | POA: Diagnosis present

## 2019-10-20 DIAGNOSIS — I69398 Other sequelae of cerebral infarction: Secondary | ICD-10-CM

## 2019-10-20 DIAGNOSIS — Z66 Do not resuscitate: Secondary | ICD-10-CM | POA: Diagnosis present

## 2019-10-20 DIAGNOSIS — Z880 Allergy status to penicillin: Secondary | ICD-10-CM

## 2019-10-20 DIAGNOSIS — K5900 Constipation, unspecified: Secondary | ICD-10-CM

## 2019-10-20 DIAGNOSIS — Z888 Allergy status to other drugs, medicaments and biological substances status: Secondary | ICD-10-CM

## 2019-10-20 DIAGNOSIS — J449 Chronic obstructive pulmonary disease, unspecified: Secondary | ICD-10-CM | POA: Diagnosis present

## 2019-10-20 DIAGNOSIS — I083 Combined rheumatic disorders of mitral, aortic and tricuspid valves: Secondary | ICD-10-CM | POA: Diagnosis present

## 2019-10-20 DIAGNOSIS — Z8249 Family history of ischemic heart disease and other diseases of the circulatory system: Secondary | ICD-10-CM

## 2019-10-20 DIAGNOSIS — K219 Gastro-esophageal reflux disease without esophagitis: Secondary | ICD-10-CM | POA: Diagnosis present

## 2019-10-20 DIAGNOSIS — G20 Parkinson's disease: Secondary | ICD-10-CM | POA: Diagnosis present

## 2019-10-20 DIAGNOSIS — E039 Hypothyroidism, unspecified: Secondary | ICD-10-CM | POA: Diagnosis present

## 2019-10-20 DIAGNOSIS — F32A Depression, unspecified: Secondary | ICD-10-CM | POA: Diagnosis present

## 2019-10-20 DIAGNOSIS — E870 Hyperosmolality and hypernatremia: Secondary | ICD-10-CM

## 2019-10-20 DIAGNOSIS — Z8349 Family history of other endocrine, nutritional and metabolic diseases: Secondary | ICD-10-CM

## 2019-10-20 DIAGNOSIS — Z7982 Long term (current) use of aspirin: Secondary | ICD-10-CM

## 2019-10-20 DIAGNOSIS — Z7401 Bed confinement status: Secondary | ICD-10-CM

## 2019-10-20 DIAGNOSIS — F419 Anxiety disorder, unspecified: Secondary | ICD-10-CM | POA: Diagnosis present

## 2019-10-20 DIAGNOSIS — Z90710 Acquired absence of both cervix and uterus: Secondary | ICD-10-CM

## 2019-10-20 DIAGNOSIS — Z79899 Other long term (current) drug therapy: Secondary | ICD-10-CM

## 2019-10-20 DIAGNOSIS — R5381 Other malaise: Secondary | ICD-10-CM

## 2019-10-20 DIAGNOSIS — I10 Essential (primary) hypertension: Secondary | ICD-10-CM | POA: Diagnosis present

## 2019-10-20 LAB — URINALYSIS, ROUTINE W REFLEX MICROSCOPIC
Bilirubin Urine: NEGATIVE
Glucose, UA: NEGATIVE mg/dL
Ketones, ur: 5 mg/dL — AB
Nitrite: NEGATIVE
Protein, ur: 100 mg/dL — AB
Specific Gravity, Urine: 1.025 (ref 1.005–1.030)
WBC, UA: >50 WBC/hpf — ABNORMAL HIGH (ref 0–5)
pH: 5 (ref 5.0–8.0)

## 2019-10-20 LAB — CBC WITH DIFFERENTIAL/PLATELET
Abs Immature Granulocytes: 0.03 10*3/uL (ref 0.00–0.07)
Basophils Absolute: 0.1 10*3/uL (ref 0.0–0.1)
Basophils Relative: 1 %
Eosinophils Absolute: 0 10*3/uL (ref 0.0–0.5)
Eosinophils Relative: 0 %
HCT: 39.9 % (ref 36.0–46.0)
Hemoglobin: 13.2 g/dL (ref 12.0–15.0)
Immature Granulocytes: 0 %
Lymphocytes Relative: 17 %
Lymphs Abs: 1.6 10*3/uL (ref 0.7–4.0)
MCH: 32.6 pg (ref 26.0–34.0)
MCHC: 33.1 g/dL (ref 30.0–36.0)
MCV: 98.5 fL (ref 80.0–100.0)
Monocytes Absolute: 0.7 10*3/uL (ref 0.1–1.0)
Monocytes Relative: 7 %
Neutro Abs: 7.1 10*3/uL (ref 1.7–7.7)
Neutrophils Relative %: 75 %
Platelets: 254 10*3/uL (ref 150–400)
RBC: 4.05 MIL/uL (ref 3.87–5.11)
RDW: 12.8 % (ref 11.5–15.5)
WBC: 9.5 10*3/uL (ref 4.0–10.5)
nRBC: 0 % (ref 0.0–0.2)

## 2019-10-20 LAB — COMPREHENSIVE METABOLIC PANEL
ALT: 6 U/L (ref 0–44)
AST: 15 U/L (ref 15–41)
Albumin: 4.2 g/dL (ref 3.5–5.0)
Alkaline Phosphatase: 83 U/L (ref 38–126)
Anion gap: 14 (ref 5–15)
BUN: 63 mg/dL — ABNORMAL HIGH (ref 8–23)
CO2: 24 mmol/L (ref 22–32)
Calcium: 9.8 mg/dL (ref 8.9–10.3)
Chloride: 109 mmol/L (ref 98–111)
Creatinine, Ser: 1.46 mg/dL — ABNORMAL HIGH (ref 0.44–1.00)
GFR calc Af Amer: 38 mL/min — ABNORMAL LOW (ref >60)
GFR calc non Af Amer: 33 mL/min — ABNORMAL LOW (ref >60)
Glucose, Bld: 107 mg/dL — ABNORMAL HIGH (ref 70–99)
Potassium: 4.2 mmol/L (ref 3.5–5.1)
Sodium: 147 mmol/L — ABNORMAL HIGH (ref 135–145)
Total Bilirubin: 0.8 mg/dL (ref 0.3–1.2)
Total Protein: 8.1 g/dL (ref 6.5–8.1)

## 2019-10-20 LAB — SARS CORONAVIRUS 2 (TAT 6-24 HRS): SARS Coronavirus 2: NEGATIVE

## 2019-10-20 MED ORDER — SODIUM CHLORIDE 0.9 % IV BOLUS
500.0000 mL | Freq: Once | INTRAVENOUS | Status: AC
  Administered 2019-10-20: 500 mL via INTRAVENOUS

## 2019-10-20 MED ORDER — CARBIDOPA-LEVODOPA 25-100 MG PO TABS
2.0000 | ORAL_TABLET | Freq: Three times a day (TID) | ORAL | Status: DC
  Administered 2019-10-20 – 2019-10-23 (×8): 2 via ORAL
  Filled 2019-10-20 (×8): qty 2

## 2019-10-20 MED ORDER — SODIUM CHLORIDE 0.9 % IV SOLN
1.0000 g | Freq: Once | INTRAVENOUS | Status: AC
  Administered 2019-10-20: 1 g via INTRAVENOUS
  Filled 2019-10-20: qty 10

## 2019-10-20 MED ORDER — ESCITALOPRAM OXALATE 10 MG PO TABS
20.0000 mg | ORAL_TABLET | Freq: Every day | ORAL | Status: DC
  Administered 2019-10-21 – 2019-10-23 (×3): 20 mg via ORAL
  Filled 2019-10-20 (×3): qty 2

## 2019-10-20 MED ORDER — SODIUM CHLORIDE 0.9 % IV SOLN
1.0000 g | INTRAVENOUS | Status: DC
  Administered 2019-10-21 – 2019-10-22 (×2): 1 g via INTRAVENOUS
  Filled 2019-10-20 (×3): qty 10

## 2019-10-20 MED ORDER — SODIUM CHLORIDE (PF) 0.9 % IJ SOLN
INTRAMUSCULAR | Status: AC
  Filled 2019-10-20: qty 50

## 2019-10-20 MED ORDER — ASPIRIN EC 81 MG PO TBEC
81.0000 mg | DELAYED_RELEASE_TABLET | Freq: Every day | ORAL | Status: DC
  Administered 2019-10-21 – 2019-10-23 (×3): 81 mg via ORAL
  Filled 2019-10-20 (×3): qty 1

## 2019-10-20 MED ORDER — ONDANSETRON HCL 4 MG/2ML IJ SOLN: 4.0000 mg | Freq: Four times a day (QID) | INTRAMUSCULAR | Status: DC | PRN

## 2019-10-20 MED ORDER — ENOXAPARIN SODIUM 30 MG/0.3ML ~~LOC~~ SOLN
30.0000 mg | SUBCUTANEOUS | Status: DC
  Administered 2019-10-20 – 2019-10-22 (×3): 30 mg via SUBCUTANEOUS
  Filled 2019-10-20 (×3): qty 0.3

## 2019-10-20 MED ORDER — BISACODYL 10 MG RE SUPP
10.0000 mg | Freq: Every day | RECTAL | Status: DC | PRN
  Administered 2019-10-20: 10 mg via RECTAL
  Filled 2019-10-20: qty 1

## 2019-10-20 MED ORDER — QUETIAPINE FUMARATE 25 MG PO TABS: 25.0000 mg | ORAL_TABLET | Freq: Four times a day (QID) | ORAL | Status: DC | PRN

## 2019-10-20 MED ORDER — POLYETHYLENE GLYCOL 3350 17 G PO PACK
17.0000 g | PACK | Freq: Every day | ORAL | Status: DC
  Administered 2019-10-20: 17 g via ORAL
  Filled 2019-10-20: qty 1

## 2019-10-20 MED ORDER — ONDANSETRON HCL 4 MG PO TABS: 4.0000 mg | ORAL_TABLET | Freq: Four times a day (QID) | ORAL | Status: DC | PRN

## 2019-10-20 MED ORDER — ACETAMINOPHEN 325 MG PO TABS
650.0000 mg | ORAL_TABLET | Freq: Four times a day (QID) | ORAL | Status: DC | PRN
  Administered 2019-10-20: 650 mg via ORAL
  Filled 2019-10-20: qty 2

## 2019-10-20 MED ORDER — ACETAMINOPHEN 650 MG RE SUPP
650.0000 mg | Freq: Four times a day (QID) | RECTAL | Status: DC | PRN
  Filled 2019-10-20: qty 1

## 2019-10-20 MED ORDER — MOMETASONE FURO-FORMOTEROL FUM 200-5 MCG/ACT IN AERO
2.0000 | INHALATION_SPRAY | Freq: Two times a day (BID) | RESPIRATORY_TRACT | Status: DC
  Administered 2019-10-22 – 2019-10-23 (×3): 2 via RESPIRATORY_TRACT
  Filled 2019-10-20: qty 8.8

## 2019-10-20 MED ORDER — IOHEXOL 300 MG/ML  SOLN
75.0000 mL | Freq: Once | INTRAMUSCULAR | Status: AC | PRN
Start: 2019-10-20 — End: 2019-10-20
  Administered 2019-10-20: 75 mL via INTRAVENOUS

## 2019-10-20 MED ORDER — SODIUM CHLORIDE 0.9 % IV SOLN: INTRAVENOUS | Status: DC

## 2019-10-20 MED ORDER — SODIUM CHLORIDE 0.9 % IV BOLUS
1000.0000 mL | Freq: Once | INTRAVENOUS | Status: AC
  Administered 2019-10-20: 1000 mL via INTRAVENOUS

## 2019-10-20 MED ORDER — SODIUM CHLORIDE 0.45 % IV SOLN: INTRAVENOUS | Status: DC

## 2019-10-20 MED ORDER — GABAPENTIN 100 MG PO CAPS
100.0000 mg | ORAL_CAPSULE | Freq: Two times a day (BID) | ORAL | Status: DC
  Administered 2019-10-20 – 2019-10-23 (×6): 100 mg via ORAL
  Filled 2019-10-20 (×6): qty 1

## 2019-10-20 MED ORDER — LORAZEPAM 1 MG PO TABS
1.0000 mg | ORAL_TABLET | ORAL | Status: DC | PRN
  Administered 2019-10-20: 1 mg via ORAL
  Filled 2019-10-20: qty 1

## 2019-10-20 MED ORDER — SORBITOL 70 % SOLN
960.0000 mL | TOPICAL_OIL | Freq: Once | ORAL | Status: AC
  Administered 2019-10-20: 960 mL via RECTAL

## 2019-10-20 MED ORDER — LEVOTHYROXINE SODIUM 88 MCG PO TABS
88.0000 ug | ORAL_TABLET | Freq: Every day | ORAL | Status: DC
  Administered 2019-10-21 – 2019-10-23 (×3): 88 ug via ORAL
  Filled 2019-10-20 (×3): qty 1

## 2019-10-20 NOTE — Progress Notes (Signed)
Patient was disimpacted with medium stool output. Dulcolax was given. Patient had immediate relief. Ativan and tylenol was given before disimpaction. Patient calm and resting well.

## 2019-10-20 NOTE — ED Notes (Signed)
Pt placed on purewic 

## 2019-10-20 NOTE — H&P (Signed)
History and Physical    [name]  [id number]  DOB: Sep 23, 1934  DOA: 10/20/2019 PCP: Ferd Hibbs, NP   Patient coming from: home  Chief Complaint: vomiting  HPI: [name] is a 85 y.o. female with medical history of dementia, COPD who lives at home with her husband and is sent for vomiting and poor oral intake. No family at bedside. History obtained from EDP. Patient is nonverbal with me. In the ED she is found to have a large stool ball in rectum, AKI and hypernatremia.  ED Course: sodium 147, BUN 63, Cr 1.46  Review of Systems:  Non-verbal  Past Medical History:  Diagnosis Date  . Acute cystitis   . Anemia   . Anxiety   . Aortic stenosis, mild 11/18/2013  . Asthma   . Back pain   . Bronchitis, acute   . Carotid artery occlusion   . Cerebrovascular disease   . COPD (chronic obstructive pulmonary disease) ([place])   . Dementia ([place])   . Depression   . Diverticulosis of colon   . DJD (degenerative joint disease)   . Fall 07/05/2015  . GERD (gastroesophageal reflux disease)   . History of sudden visual loss   . Hyperlipidemia   . Hypertension   . Hypothyroidism   . Parkinson's disease ([place])   . Pneumonia 2015   hx of x 2   . Pulmonary hypertension, moderate to severe ([place]) 11/19/2014  . Shortness of breath    with exertion   . Stroke [name])    2010 partial blind in left eye   . Transient ischemic attack   . UTI (lower urinary tract infection)    hx of     Past Surgical History:  Procedure Laterality Date  . CARDIAC CATHETERIZATION  02/18/14  . CARDIOTHORACIC PROCEDURE  01/13/2009   Right   . COLECTOMY    . ENDARTERECTOMY     right carotid endartarectomy - 2010  . FOOT SURGERY     nerve cut between toes   . LEFT AND RIGHT HEART CATHETERIZATION WITH CORONARY ANGIOGRAM N/A 02/18/2014   Procedure: LEFT AND RIGHT HEART CATHETERIZATION WITH CORONARY ANGIOGRAM;  Surgeon: Larey Dresser, MD;  Location: [facility] CATH LAB;  Service: Cardiovascular;  Laterality:  N/A;  . REPLACEMENT TOTAL KNEE     right and left knee  . TEE WITHOUT CARDIOVERSION N/A 02/08/2014   Procedure: TRANSESOPHAGEAL ECHOCARDIOGRAM (TEE);  Surgeon: Larey Dresser, MD;  Location: [place];  Service: Cardiovascular;  Laterality: N/A;  . TOTAL KNEE ARTHROPLASTY Left 11/17/2012   Procedure: TOTAL KNEE ARTHROPLASTY;  Surgeon: Gearlean Alf, MD;  Location: [facility] ORS;  Service: Orthopedics;  Laterality: Left;  . TUBAL LIGATION    . VAGINAL HYSTERECTOMY      Social History:   reports that she has never smoked. She has never used smokeless tobacco. She reports that she does not drink alcohol or use drugs.  Allergies  Allergen Reactions  . Relafen [Nabumetone] Diarrhea and Other (See Comments)    GI / Urinary Bleeding  . Lipitor [Atorvastatin] Other (See Comments)    Causes memory loss  . Other Other (See Comments)    "orvail" unknown:  Causes Bloody stool  . Penicillins Hives, Itching, Swelling and Rash    Has patient had a PCN reaction causing immediate rash, facial/tongue/throat swelling, SOB or lightheadedness with hypotension: no Has patient had a PCN reaction causing severe rash involving mucus membranes or skin necrosis: unknown Has patient had a PCN reaction that required  hospitalization no Has patient had a PCN reaction occurring within the last 10 years: no If all of the above answers are "NO", then may proceed with Cephalosporin use.     Family History  Problem Relation Age of Onset  . Emphysema Father   . Heart disease Mother   . Hyperlipidemia Mother   . Hypertension Mother   . Coronary artery disease Sister   . Heart disease Sister   . Hypertension Sister   . Varicose Veins Sister   . AAA (abdominal aortic aneurysm) Sister   . Rheumatic fever Brother        x2  . Heart attack Brother      Prior to Admission medications   Medication Sig Start Date End Date Taking? Authorizing Provider  ADVAIR HFA 230-21 MCG/ACT inhaler Inhale 2 puffs into the lungs  2 (two) times daily. 11/23/14  Yes Biagio Borg, MD  aspirin EC 81 MG tablet Take 81 mg by mouth daily.   Yes [provider]  carbidopa-levodopa (SINEMET IR) 25-100 MG tablet Take 2 tablets by mouth 3 (three) times daily. for parkinson's disease   Yes [provider]  escitalopram (LEXAPRO) 20 MG tablet Take 20 mg by mouth daily.    Yes [provider]  gabapentin (NEURONTIN) 100 MG capsule Take 100 mg by mouth 2 (two) times daily.   Yes [provider]  levothyroxine (SYNTHROID) 88 MCG tablet Take 88 mcg by mouth daily at 6 (six) AM.    Yes [provider]  LORazepam (ATIVAN) 1 MG tablet Take 1 mg by mouth every 4 (four) hours as needed for anxiety.    Yes [provider]  polyethylene glycol (MIRALAX / GLYCOLAX) packet Take 17 g by mouth daily.   Yes [provider]  QUEtiapine (SEROQUEL) 25 MG tablet Take 25 mg by mouth every 6 (six) hours as needed (agitation).   Yes [provider]  Riboflavin (VITAMIN B-2 PO) Take 1,000 mcg by mouth daily.    Yes [provider]  traMADol (ULTRAM) 50 MG tablet Take 50 mg by mouth every 6 (six) hours as needed for moderate pain.   Yes [provider]    Physical Exam: Wt Readings from Last 3 Encounters:  02/26/18 73.9 kg  05/01/17 54.4 kg  03/12/17 59 kg   Vitals:   10/20/19 1519 10/20/19 1530 10/20/19 1545 10/20/19 1600  BP:  (!) 123/97  124/84  Pulse:  84 84 85  Resp:  18  18  Temp:      TempSrc:      SpO2: 98% 98% 97% 96%      Constitutional:  Calm & comfortable Eyes: PERRLA, lids and conjunctivae normal ENT:  Mucous membranes are moist.  Pharynx clear of exudate   Normal dentition.  Neck: Supple, no masses  Respiratory:  Clear to auscultation bilaterally  Normal respiratory effort.  Cardiovascular:  S1 & S2 heard, regular rate and rhythm No Murmurs Abdomen:  Non distended No tenderness, No masses Bowel sounds normal Extremities:  No  clubbing / cyanosis No pedal edema No joint deformity    Skin:  No rashes, lesions or ulcers Neurologic:  Alert, nonverbal, does not follow commands, moving arms and legs. Psychiatric:  Cannot assess    Labs on Admission: I have personally reviewed following labs and imaging studies  CBC: Recent Labs  Lab 10/20/19 1246  WBC 9.5  NEUTROABS 7.1  HGB 13.2  HCT 39.9  MCV 98.5  PLT 254  Basic Metabolic Panel: Recent Labs  Lab 10/20/19 1246  NA 147*  K 4.2  CL 109  CO2 24  GLUCOSE 107*  BUN 63*  CREATININE 1.46*  CALCIUM 9.8   GFR: CrCl cannot be calculated (Unknown ideal weight.). Liver Function Tests: Recent Labs  Lab 10/20/19 1246  AST 15  ALT 6  ALKPHOS 83  BILITOT 0.8  PROT 8.1  ALBUMIN 4.2   No results for input(s): LIPASE, AMYLASE in the last 168 hours. No results for input(s): AMMONIA in the last 168 hours. Coagulation Profile: No results for input(s): INR, PROTIME in the last 168 hours. Cardiac Enzymes: No results for input(s): CKTOTAL, CKMB, CKMBINDEX, TROPONINI in the last 168 hours. BNP (last 3 results) No results for input(s): PROBNP in the last 8760 hours. HbA1C: No results for input(s): HGBA1C in the last 72 hours. CBG: No results for input(s): GLUCAP in the last 168 hours. Lipid Profile: No results for input(s): CHOL, HDL, LDLCALC, TRIG, CHOLHDL, LDLDIRECT in the last 72 hours. Thyroid Function Tests: No results for input(s): TSH, T4TOTAL, FREET4, T3FREE, THYROIDAB in the last 72 hours. Anemia Panel: No results for input(s): VITAMINB12, FOLATE, FERRITIN, TIBC, IRON, RETICCTPCT in the last 72 hours. Urine analysis:    Component Value Date/Time   COLORURINE YELLOW 10/20/2019 1246   APPEARANCEUR CLOUDY (A) 10/20/2019 1246   LABSPEC 1.025 10/20/2019 1246   PHURINE 5.0 10/20/2019 1246   GLUCOSEU NEGATIVE 10/20/2019 1246   GLUCOSEU NEGATIVE 07/05/2015 1724   HGBUR MODERATE (A) 10/20/2019 1246   BILIRUBINUR NEGATIVE 10/20/2019 1246    KETONESUR 5 (A) 10/20/2019 1246   PROTEINUR 100 (A) 10/20/2019 1246   UROBILINOGEN 0.2 07/05/2015 1724   NITRITE NEGATIVE 10/20/2019 1246   LEUKOCYTESUR LARGE (A) 10/20/2019 1246   Sepsis Labs: @LABRCNTIP (procalcitonin:4,lacticidven:4) )No results found for this or any previous visit (from the past 240 hour(s)).   Radiological Exams on Admission: CT Head Wo Contrast  Result Date: 10/20/2019 CLINICAL DATA:  Anorexia for 4 days, altered level of consciousness EXAM: CT HEAD WITHOUT CONTRAST TECHNIQUE: Contiguous axial images were obtained from the base of the skull through the vertex without intravenous contrast. COMPARISON:  02/26/2018 FINDINGS: Brain: No acute infarct or hemorrhage. Chronic small vessel ischemic changes are seen within the periventricular white matter. Lateral ventricles and midline structures appear unremarkable. No acute extra-axial fluid collections. No mass effect. Vascular: No hyperdense vessel or unexpected calcification. Skull: Normal. Negative for fracture or focal lesion. Sinuses/Orbits: No acute finding. Other: None IMPRESSION: 1. Stable head CT, no acute process. Electronically Signed   By: Randa Ngo M.D.   On: 10/20/2019 14:34   CT Abdomen Pelvis W Contrast  Result Date: 10/20/2019 CLINICAL DATA:  Altered mental status, vomiting, decreased appetite EXAM: CT ABDOMEN AND PELVIS WITH CONTRAST TECHNIQUE: Multidetector CT imaging of the abdomen and pelvis was performed using the standard protocol following bolus administration of intravenous contrast. CONTRAST:  72mL OMNIPAQUE IOHEXOL 300 MG/ML  SOLN COMPARISON:  10/17/2015 FINDINGS: Lower chest: Small to moderate-sized hiatal hernia. There is asymmetric soft tissue thickening along the right lateral aspect the level of the GE junction, poorly characterized on the cranial most axial image (series 2, image 1). Mild bibasilar scarring. Heart size normal. Hepatobiliary: Liver has an unremarkable appearance without focal  hepatic lesion. There is a very small amount of layering hyperdense material within the gallbladder lumen. No biliary dilatation. Pancreas: Unremarkable. No pancreatic ductal dilatation or surrounding inflammatory changes. Spleen: Normal in size without focal abnormality. Adrenals/Urinary Tract: Unremarkable adrenal glands.  Mild bilateral renal cortical atrophy. Tiny cyst within the inferior pole the right kidney. Kidneys are otherwise unremarkable. No stone or hydronephrosis. Urinary bladder is anteriorly displaced by large rectal stool ball, but appears otherwise unremarkable. Stomach/Bowel: Rectum is distended with a prominent stool ball measuring 7.8 x 6.2 cm transaxially. Moderate volume of stool throughout the remainder of the colon. No focal bowel wall thickening or inflammatory changes are seen. Appendix was not definitively visualized. No bowel distension to suggest obstruction. Vascular/Lymphatic: Aortic atherosclerosis without aneurysm. No abdominopelvic lymphadenopathy. Reproductive: Status post hysterectomy. No adnexal masses. Other: No abdominal wall hernia or abnormality. No abdominopelvic ascites. Musculoskeletal: 8 mm anterolisthesis L4 on L5. Degenerative disc disease of the lower lumbar spine. No acute osseous findings. IMPRESSION: 1. Rectum is distended with a large stool ball measuring 7.8 x 6.2 cm transaxially. Moderate volume of stool throughout the remainder of the colon suggesting constipation. 2. Small-to-moderate hiatal hernia with asymmetric soft tissue thickening at the GE junction, poorly characterized on the cranial-most axial image. Consider direct visualization. 3. Cholelithiasis Aortic Atherosclerosis (ICD10-I70.0). Electronically Signed   By: Davina Poke D.O.   On: 10/20/2019 14:45   DG Chest Port 1 View  Result Date: 10/20/2019 CLINICAL DATA:  Anorexia for 3-4 days, vomiting, Alzheimer's EXAM: PORTABLE CHEST 1 VIEW COMPARISON:  03/24/2019 FINDINGS: Single frontal view of  the chest demonstrates an unremarkable cardiac silhouette. No airspace disease, effusion, or pneumothorax. Chronic interstitial prominence. No acute bony abnormalities. IMPRESSION: 1. Chronic interstitial lung disease.  No acute process. Electronically Signed   By: Randa Ngo M.D.   On: 10/20/2019 13:04    EKG: Independently reviewed. Normal sinus rhythm  Assessment/Plan Principal Problem:   Intractable vomiting- stool impaction - large stool ball in rectum- EDP trying manual disimpaction and enemas - NPO for now- will give IVF  Active Problems:   AKI (acute kidney injury) ([place])   Hypernatremia - related to above vomiting and poor oral intake - she received 2 L NS in ED- start 1/2 NS    Acute lower UTI - cont Ceftriaxone    Hypothyroidism - resume Synthroid    Depression - resume Lexapro      Parkinson disease  -cont Sinemet as tolerated    Dementia - possibly related to Parkinson's- she received Ativan 1mg  every 4 hrs andSeroquel 25 mg every 6 hrs PRN anxiety at home- will use if behavioral disturbances are noted in the hospital  Note: per prior ECHO 12/10/14 Severe mitral  regurgitation. Moderate aortic regurgitation. Moderate tricuspid  regurgitation.   DVT prophylaxis: Lovenox  Code Status: DNR per prior records in chart  Family Communication: I called the 3 mobile number on the face sheet for her children and received no response- prior CODE status in chart stated as DNR Disposition Plan: from home  Consults called: none Admission status: observation   Debbe Odea MD [facility] Pager: [email or web address] Password [email or web address] 7PM-7AM, please contact night-coverage   10/20/2019, 5:00 PM

## 2019-10-20 NOTE — ED Triage Notes (Signed)
Per EMS, pt from home has not been eating for the past 3-4 days. Pt has home health. One of her home health nurses said she was vomiting. Pt has hx of Alzheimer's and is at baseline. Pt is on 2L [place] at home. Pt recently treated for UTI.   CBG 195  BP 110/66 HR 102 O2 100% on 2L [place]

## 2019-10-20 NOTE — ED Provider Notes (Signed)
[facility] DEPT Provider Note   CSN: [id number] Arrival date & time: 10/20/19  1121     History Chief Complaint  Patient presents with  . Failure To Thrive    Arantza Kellermann is a 85 y.o. female.  The history is provided by the patient and medical records. No language interpreter was used.   Nicolet Nega is a 85 y.o. female who presents to the Emergency Department complaining of vomiting. Level V caveat due to dementia. History is provided by the patient's caregiver. She presents the emergency department from home for evaluation of vomiting for the last three days. She has been refusing to take anything by mouth for three days and has experienced occasional emesis. She is also been unable to take her pills. No reports of fevers. She does have associated constipation. She lives at home with her husband and has a 24 hour caregiver and sitter. No recent falls. Symptoms are severe, constant, worsening.    Past Medical History:  Diagnosis Date  . Acute cystitis   . Anemia   . Anxiety   . Aortic stenosis, mild 11/18/2013  . Asthma   . Back pain   . Bronchitis, acute   . Carotid artery occlusion   . Cerebrovascular disease   . COPD (chronic obstructive pulmonary disease) ([place])   . Dementia ([place])   . Depression   . Diverticulosis of colon   . DJD (degenerative joint disease)   . Fall 07/05/2015  . GERD (gastroesophageal reflux disease)   . History of sudden visual loss   . Hyperlipidemia   . Hypertension   . Hypothyroidism   . Parkinson's disease ([place])   . Pneumonia 2015   hx of x 2   . Pulmonary hypertension, moderate to severe ([place]) 11/19/2014  . Shortness of breath    with exertion   . 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 Transient ischemic attack   . UTI (lower urinary tract infection)    hx of     Patient Active Problem List   Diagnosis Date Noted  . Protein-calorie malnutrition, severe 11/05/2016  . COPD (chronic  obstructive pulmonary disease) ([place]) 11/04/2016  . Stroke (cerebrum) ([place]) 11/04/2016  . CKD (chronic kidney disease), stage III ([place]) 11/04/2016  . Acute encephalopathy 10/04/2016  . Dementia in Alzheimer's disease ([place]) 10/17/2015  . Encephalopathy, metabolic [id number]  . Contusion, hip 07/07/2015  . Falls frequently 07/07/2015  . Cervical radiculopathy 07/07/2015  . Anxiety and depression 07/07/2015  . Gait disturbance 07/07/2015  . Hyperglycemia 07/07/2015  . FTT (failure to thrive) in adult 07/07/2015  . Parkinson disease ([place])   . Chronic neck pain 07/05/2015  . Debility 07/05/2015  . Fatigue 01/19/2015  . Dysuria 01/19/2015  . Chronic restrictive lung disease 01/04/2015  . DOE (dyspnea on exertion) 11/19/2014  . Pulmonary hypertension, moderate to severe ([place]) 11/19/2014  . Irregular heart beats 08/12/2014  . Aftercare following surgery of the circulatory system, [place] 04/19/2014  . Pain of left lower extremity-Knee 04/19/2014  . Weakness 02/11/2014  . Loss of weight 02/11/2014  . Valvular heart disease 01/19/2014  . Pulmonary hypertension ([place]) 01/19/2014  . Fall 01/06/2014  . Aortic stenosis, mild 11/18/2013  . Preventative health care 11/18/2013  . Macrocytic anemia 11/18/2013  . Tremor 09/21/2013  . Dyspnea 05/18/2013  . Physical deconditioning 05/18/2013  . OA (osteoarthritis) of knee 11/17/2012  . Hyperlipidemia 11/04/2012  . Occlusion and stenosis of carotid artery without mention  of cerebral infarction 03/06/2012  . ASTHMA 10/09/2010  . Sudden visual loss 01/10/2009  . TRANSIENT ISCHEMIC ATTACK 01/10/2009  . CEREBROVASCULAR DISEASE 11/19/2007  . BRONCHITIS, RECURRENT 11/19/2007  . DIVERTICULOSIS OF COLON 11/19/2007  . Hypothyroidism 08/15/2007  . Anxiety state 08/15/2007  . Depression 08/15/2007  . Essential hypertension 08/15/2007  . GERD 08/15/2007  . Osteoarthritis 08/15/2007  . BACK PAIN, LUMBAR 08/15/2007    Past Surgical History:  Procedure  Laterality Date  . CARDIAC CATHETERIZATION  02/18/14  . CARDIOTHORACIC PROCEDURE  01/13/2009   Right   . COLECTOMY    . ENDARTERECTOMY     right carotid endartarectomy - 2010  . FOOT SURGERY     nerve cut between toes   . LEFT AND RIGHT HEART CATHETERIZATION WITH CORONARY ANGIOGRAM N/A 02/18/2014   Procedure: LEFT AND RIGHT HEART CATHETERIZATION WITH CORONARY ANGIOGRAM;  Surgeon: Larey Dresser, MD;  Location: [facility] CATH LAB;  Service: Cardiovascular;  Laterality: N/A;  . REPLACEMENT TOTAL KNEE     right and left knee  . TEE WITHOUT CARDIOVERSION N/A 02/08/2014   Procedure: TRANSESOPHAGEAL ECHOCARDIOGRAM (TEE);  Surgeon: Larey Dresser, MD;  Location: [place];  Service: Cardiovascular;  Laterality: N/A;  . TOTAL KNEE ARTHROPLASTY Left 11/17/2012   Procedure: TOTAL KNEE ARTHROPLASTY;  Surgeon: Gearlean Alf, MD;  Location: [facility];  Service: Orthopedics;  Laterality: Left;  . TUBAL LIGATION    . VAGINAL HYSTERECTOMY       OB History   No obstetric history on file.     Family History  Problem Relation Age of Onset  . Emphysema Father   . Heart disease Mother   . Hyperlipidemia Mother   . Hypertension Mother   . Coronary artery disease Sister   . Heart disease Sister   . Hypertension Sister   . Varicose Veins Sister   . AAA (abdominal aortic aneurysm) Sister   . Rheumatic fever Brother        x2  . Heart attack Brother     Social History   Tobacco Use  . Smoking status: Never Smoker  . Smokeless tobacco: Never Used  Substance Use Topics  . Alcohol use: No  . Drug use: No    Home Medications Prior to Admission medications   Medication Sig Start Date End Date Taking? Authorizing Provider  ADVAIR HFA 230-21 MCG/ACT inhaler Inhale 2 puffs into the lungs 2 (two) times daily. 11/23/14  Yes Biagio Borg, MD  aspirin EC 81 MG tablet Take 81 mg by mouth daily.   Yes [provider]  carbidopa-levodopa (SINEMET IR) 25-100 MG tablet Take 2 tablets by mouth 3  (three) times daily. for parkinson's disease   Yes [provider]  escitalopram (LEXAPRO) 20 MG tablet Take 20 mg by mouth daily.    Yes [provider]  gabapentin (NEURONTIN) 100 MG capsule Take 100 mg by mouth 2 (two) times daily.   Yes [provider]  levothyroxine (SYNTHROID) 88 MCG tablet Take 88 mcg by mouth daily at 6 (six) AM.    Yes [provider]  LORazepam (ATIVAN) 1 MG tablet Take 1 mg by mouth every 4 (four) hours as needed for anxiety.    Yes [provider]  polyethylene glycol (MIRALAX / GLYCOLAX) packet Take 17 g by mouth daily.   Yes [provider]  QUEtiapine (SEROQUEL) 25 MG tablet Take 25 mg by mouth every 6 (six) hours as needed (agitation).   Yes [provider]  Riboflavin (VITAMIN B-2 PO) Take 1,000 mcg by mouth daily.    Yes [provider]  traMADol (ULTRAM) 50 MG tablet Take 50 mg by mouth every 6 (six) hours as needed for moderate pain.   Yes [provider]    Allergies    Relafen [nabumetone], Lipitor [atorvastatin], Other, and Penicillins  Review of Systems   Review of Systems  All other systems reviewed and are negative.   Physical Exam Updated Vital Signs BP (!) 142/67   Pulse 96   Temp 98.3 F (36.8 C) (Oral)   Resp 18   SpO2 98%   Physical Exam Vitals and nursing note reviewed.  Constitutional:      Appearance: She is well-developed.  HENT:     Head: Normocephalic and atraumatic.     Comments: Dry mucous membranes Cardiovascular:     Rate and Rhythm: Normal rate and regular rhythm.  Pulmonary:     Effort: Pulmonary effort is normal. No respiratory distress.  Abdominal:     Palpations: Abdomen is soft.     Tenderness: There is no abdominal tenderness. There is no guarding or rebound.  Musculoskeletal:        General: No swelling or tenderness.  Skin:    General: Skin is warm and dry.  Neurological:     Mental Status: She is alert.     Comments:  Nonverbal. Awake. Moves all extremities symmetrically.  Psychiatric:     Comments: Unable to assess     ED Results / Procedures / Treatments   Labs (all labs ordered are listed, but only abnormal results are displayed) Labs Reviewed  COMPREHENSIVE METABOLIC PANEL - Abnormal; Notable for the following components:      Result Value   Sodium 147 (*)    Glucose, Bld 107 (*)    BUN 63 (*)    Creatinine, Ser 1.46 (*)    GFR calc non Af Amer 33 (*)    GFR calc Af Amer 38 (*)    All other components within normal limits  URINALYSIS, ROUTINE W REFLEX MICROSCOPIC - Abnormal; Notable for the following components:   APPearance CLOUDY (*)    Hgb urine dipstick MODERATE (*)    Ketones, ur 5 (*)    Protein, ur 100 (*)    Leukocytes,Ua LARGE (*)    WBC, UA >50 (*)    Bacteria, UA MANY (*)    All other components within normal limits  URINE CULTURE  SARS CORONAVIRUS 2 (TAT 6-24 HRS)  CBC WITH DIFFERENTIAL/PLATELET    EKG EKG Interpretation  Date/Time:  Tuesday October 20 2019 11:38:02 EDT Ventricular Rate:  95 PR Interval:    QRS Duration: 84 QT Interval:  347 QTC Calculation: 437 R Axis:   53 Text Interpretation: Sinus rhythm Low voltage, extremity leads Confirmed by Quintella Reichert [phone]) on 10/20/2019 12:46:13 PM   Radiology CT Head Wo Contrast  Result Date: 10/20/2019 CLINICAL DATA:  Anorexia for 4 days, altered level of consciousness EXAM: CT HEAD WITHOUT CONTRAST TECHNIQUE: Contiguous axial images were obtained from the base of the skull through the vertex without intravenous contrast. COMPARISON:  02/26/2018 FINDINGS: Brain: No acute infarct or hemorrhage. Chronic small vessel ischemic changes are seen within the periventricular white matter. Lateral ventricles and midline structures appear unremarkable. No acute extra-axial fluid collections. No mass effect. Vascular: No hyperdense vessel or unexpected calcification. Skull: Normal. Negative for fracture or focal lesion.  Sinuses/Orbits: No acute finding. Other: None IMPRESSION: 1. Stable head CT, no acute  process. Electronically Signed   By: Randa Ngo M.D.   On: 10/20/2019 14:34   CT Abdomen Pelvis W Contrast  Result Date: 10/20/2019 CLINICAL DATA:  Altered mental status, vomiting, decreased appetite EXAM: CT ABDOMEN AND PELVIS WITH CONTRAST TECHNIQUE: Multidetector CT imaging of the abdomen and pelvis was performed using the standard protocol following bolus administration of intravenous contrast. CONTRAST:  34mL OMNIPAQUE IOHEXOL 300 MG/ML  SOLN COMPARISON:  10/17/2015 FINDINGS: Lower chest: Small to moderate-sized hiatal hernia. There is asymmetric soft tissue thickening along the right lateral aspect the level of the GE junction, poorly characterized on the cranial most axial image (series 2, image 1). Mild bibasilar scarring. Heart size normal. Hepatobiliary: Liver has an unremarkable appearance without focal hepatic lesion. There is a very small amount of layering hyperdense material within the gallbladder lumen. No biliary dilatation. Pancreas: Unremarkable. No pancreatic ductal dilatation or surrounding inflammatory changes. Spleen: Normal in size without focal abnormality. Adrenals/Urinary Tract: Unremarkable adrenal glands. Mild bilateral renal cortical atrophy. Tiny cyst within the inferior pole the right kidney. Kidneys are otherwise unremarkable. No stone or hydronephrosis. Urinary bladder is anteriorly displaced by large rectal stool ball, but appears otherwise unremarkable. Stomach/Bowel: Rectum is distended with a prominent stool ball measuring 7.8 x 6.2 cm transaxially. Moderate volume of stool throughout the remainder of the colon. No focal bowel wall thickening or inflammatory changes are seen. Appendix was not definitively visualized. No bowel distension to suggest obstruction. Vascular/Lymphatic: Aortic atherosclerosis without aneurysm. No abdominopelvic lymphadenopathy. Reproductive: Status post  hysterectomy. No adnexal masses. Other: No abdominal wall hernia or abnormality. No abdominopelvic ascites. Musculoskeletal: 8 mm anterolisthesis L4 on L5. Degenerative disc disease of the lower lumbar spine. No acute osseous findings. IMPRESSION: 1. Rectum is distended with a large stool ball measuring 7.8 x 6.2 cm transaxially. Moderate volume of stool throughout the remainder of the colon suggesting constipation. 2. Small-to-moderate hiatal hernia with asymmetric soft tissue thickening at the GE junction, poorly characterized on the cranial-most axial image. Consider direct visualization. 3. Cholelithiasis Aortic Atherosclerosis (ICD10-I70.0). Electronically Signed   By: Davina Poke D.O.   On: 10/20/2019 14:45   DG Chest Port 1 View  Result Date: 10/20/2019 CLINICAL DATA:  Anorexia for 3-4 days, vomiting, Alzheimer's EXAM: PORTABLE CHEST 1 VIEW COMPARISON:  03/24/2019 FINDINGS: Single frontal view of the chest demonstrates an unremarkable cardiac silhouette. No airspace disease, effusion, or pneumothorax. Chronic interstitial prominence. No acute bony abnormalities. IMPRESSION: 1. Chronic interstitial lung disease.  No acute process. Electronically Signed   By: Randa Ngo M.D.   On: 10/20/2019 13:04    Procedures Procedures (including critical care time)  Medications Ordered in ED Medications  sorbitol, milk of mag, mineral oil, glycerin (SMOG) enema (has no administration in time range)  sodium chloride 0.9 % bolus 500 mL (0 mLs Intravenous Stopped 10/20/19 1336)  cefTRIAXone (ROCEPHIN) 1 g in sodium chloride 0.9 % 100 mL IVPB (1 g Intravenous New Bag/Given 10/20/19 1425)  sodium chloride 0.9 % bolus 1,000 mL (1,000 mLs Intravenous New Bag/Given 10/20/19 1428)  iohexol (OMNIPAQUE) 300 MG/ML solution 75 mL (75 mLs Intravenous Contrast Given 10/20/19 1408)  sodium chloride (PF) 0.9 % injection (  Given by Other 10/20/19 1455)    ED Course  I have reviewed the triage vital signs and the  nursing notes.  Pertinent labs & imaging results that were available during my care of the patient were reviewed by me and considered in my medical decision making (see chart for details).  MDM Rules/Calculators/A&P                     Patient presents the emergency department from home for evaluation of vomiting and poor oral intake for the last several days. She is dehydrated appearing on evaluation. Urinalysis is consistent with UTI. She is treated with IV fluids and IV antibiotics. CT abdomen pelvis obtained, which demonstrates large stool, will treat with anima and impaction. Hospitalist consulted for observation for UTI with dehydration.  Final Clinical Impression(s) / ED Diagnoses Final diagnoses:  None    Rx / DC Orders ED Discharge Orders    None       Quintella Reichert, MD 10/20/19 1544

## 2019-10-21 DIAGNOSIS — F0390 Unspecified dementia without behavioral disturbance: Secondary | ICD-10-CM | POA: Diagnosis present

## 2019-10-21 DIAGNOSIS — Z20822 Contact with and (suspected) exposure to covid-19: Secondary | ICD-10-CM | POA: Diagnosis present

## 2019-10-21 DIAGNOSIS — E870 Hyperosmolality and hypernatremia: Secondary | ICD-10-CM | POA: Diagnosis present

## 2019-10-21 DIAGNOSIS — F0280 Dementia in other diseases classified elsewhere without behavioral disturbance: Secondary | ICD-10-CM | POA: Diagnosis present

## 2019-10-21 DIAGNOSIS — Z66 Do not resuscitate: Secondary | ICD-10-CM | POA: Diagnosis present

## 2019-10-21 DIAGNOSIS — Z96653 Presence of artificial knee joint, bilateral: Secondary | ICD-10-CM | POA: Diagnosis present

## 2019-10-21 DIAGNOSIS — N390 Urinary tract infection, site not specified: Secondary | ICD-10-CM | POA: Diagnosis not present

## 2019-10-21 DIAGNOSIS — J449 Chronic obstructive pulmonary disease, unspecified: Secondary | ICD-10-CM | POA: Diagnosis present

## 2019-10-21 DIAGNOSIS — K5641 Fecal impaction: Secondary | ICD-10-CM | POA: Diagnosis present

## 2019-10-21 DIAGNOSIS — I10 Essential (primary) hypertension: Secondary | ICD-10-CM | POA: Diagnosis not present

## 2019-10-21 DIAGNOSIS — F419 Anxiety disorder, unspecified: Secondary | ICD-10-CM | POA: Diagnosis present

## 2019-10-21 DIAGNOSIS — I272 Pulmonary hypertension, unspecified: Secondary | ICD-10-CM | POA: Diagnosis present

## 2019-10-21 DIAGNOSIS — G219 Secondary parkinsonism, unspecified: Secondary | ICD-10-CM | POA: Diagnosis present

## 2019-10-21 DIAGNOSIS — K219 Gastro-esophageal reflux disease without esophagitis: Secondary | ICD-10-CM | POA: Diagnosis present

## 2019-10-21 DIAGNOSIS — E039 Hypothyroidism, unspecified: Secondary | ICD-10-CM | POA: Diagnosis present

## 2019-10-21 DIAGNOSIS — H5462 Unqualified visual loss, left eye, normal vision right eye: Secondary | ICD-10-CM | POA: Diagnosis present

## 2019-10-21 DIAGNOSIS — F329 Major depressive disorder, single episode, unspecified: Secondary | ICD-10-CM | POA: Diagnosis present

## 2019-10-21 DIAGNOSIS — I69398 Other sequelae of cerebral infarction: Secondary | ICD-10-CM | POA: Diagnosis not present

## 2019-10-21 DIAGNOSIS — R1110 Vomiting, unspecified: Secondary | ICD-10-CM | POA: Diagnosis not present

## 2019-10-21 DIAGNOSIS — N179 Acute kidney failure, unspecified: Secondary | ICD-10-CM | POA: Diagnosis not present

## 2019-10-21 DIAGNOSIS — E785 Hyperlipidemia, unspecified: Secondary | ICD-10-CM | POA: Diagnosis present

## 2019-10-21 DIAGNOSIS — Z9049 Acquired absence of other specified parts of digestive tract: Secondary | ICD-10-CM | POA: Diagnosis not present

## 2019-10-21 DIAGNOSIS — E860 Dehydration: Secondary | ICD-10-CM | POA: Diagnosis present

## 2019-10-21 DIAGNOSIS — Z90710 Acquired absence of both cervix and uterus: Secondary | ICD-10-CM | POA: Diagnosis not present

## 2019-10-21 DIAGNOSIS — I083 Combined rheumatic disorders of mitral, aortic and tricuspid valves: Secondary | ICD-10-CM | POA: Diagnosis present

## 2019-10-21 LAB — BASIC METABOLIC PANEL
Anion gap: 8 (ref 5–15)
BUN: 42 mg/dL — ABNORMAL HIGH (ref 8–23)
CO2: 24 mmol/L (ref 22–32)
Calcium: 9.2 mg/dL (ref 8.9–10.3)
Chloride: 114 mmol/L — ABNORMAL HIGH (ref 98–111)
Creatinine, Ser: 1.11 mg/dL — ABNORMAL HIGH (ref 0.44–1.00)
GFR calc Af Amer: 54 mL/min — ABNORMAL LOW (ref >60)
GFR calc non Af Amer: 46 mL/min — ABNORMAL LOW (ref >60)
Glucose, Bld: 91 mg/dL (ref 70–99)
Potassium: 5.1 mmol/L (ref 3.5–5.1)
Sodium: 146 mmol/L — ABNORMAL HIGH (ref 135–145)

## 2019-10-21 LAB — CBC
HCT: 34.2 % — ABNORMAL LOW (ref 36.0–46.0)
Hemoglobin: 11 g/dL — ABNORMAL LOW (ref 12.0–15.0)
MCH: 32 pg (ref 26.0–34.0)
MCHC: 32.2 g/dL (ref 30.0–36.0)
MCV: 99.4 fL (ref 80.0–100.0)
Platelets: 202 10*3/uL (ref 150–400)
RBC: 3.44 MIL/uL — ABNORMAL LOW (ref 3.87–5.11)
RDW: 12.8 % (ref 11.5–15.5)
WBC: 7.7 10*3/uL (ref 4.0–10.5)
nRBC: 0 % (ref 0.0–0.2)

## 2019-10-21 LAB — URINE CULTURE

## 2019-10-21 MED ORDER — ADULT MULTIVITAMIN W/MINERALS CH
1.0000 | ORAL_TABLET | Freq: Every day | ORAL | Status: DC
  Administered 2019-10-21 – 2019-10-23 (×3): 1 via ORAL
  Filled 2019-10-21 (×2): qty 1

## 2019-10-21 MED ORDER — RESOURCE THICKENUP CLEAR PO POWD
ORAL | Status: DC | PRN
  Filled 2019-10-21: qty 125

## 2019-10-21 MED ORDER — DEXTROSE 5 % IV SOLN: INTRAVENOUS | Status: DC

## 2019-10-21 MED ORDER — POLYETHYLENE GLYCOL 3350 17 G PO PACK
17.0000 g | PACK | Freq: Two times a day (BID) | ORAL | Status: DC
  Administered 2019-10-21 – 2019-10-22 (×3): 17 g via ORAL
  Filled 2019-10-21 (×3): qty 1

## 2019-10-21 MED ORDER — BISACODYL 10 MG RE SUPP: 10.0000 mg | Freq: Every day | RECTAL | Status: DC | PRN

## 2019-10-21 MED ORDER — SENNOSIDES-DOCUSATE SODIUM 8.6-50 MG PO TABS
1.0000 | ORAL_TABLET | Freq: Two times a day (BID) | ORAL | Status: DC
  Administered 2019-10-21 – 2019-10-23 (×5): 1 via ORAL
  Filled 2019-10-21 (×5): qty 1

## 2019-10-21 NOTE — Evaluation (Addendum)
Clinical/Bedside Swallow Evaluation Patient Details  Name: [name] MRN: [id number] Date of Birth: [birth year]  Today's Date: 10/21/2019 Time: SLP Start Time (ACUTE ONLY): [id number] SLP Stop Time (ACUTE ONLY): 1615 SLP Time Calculation (min) (ACUTE ONLY): 10 min  Past Medical History:  Past Medical History:  Diagnosis Date  . Acute cystitis   . Anemia   . Anxiety   . Aortic stenosis, mild 11/18/2013  . Asthma   . Back pain   . Bronchitis, acute   . Carotid artery occlusion   . Cerebrovascular disease   . COPD (chronic obstructive pulmonary disease) ([place])   . Dementia ([place])   . Depression   . Diverticulosis of colon   . DJD (degenerative joint disease)   . Fall 07/05/2015  . GERD (gastroesophageal reflux disease)   . History of sudden visual loss   . Hyperlipidemia   . Hypertension   . Hypothyroidism   . Parkinson's disease ( [place])   . Pneumonia 2015   hx of x 2   . Pulmonary hypertension, moderate to severe ([place]) 11/19/2014  . Shortness of breath    with exertion   . 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 Transient ischemic attack   . UTI (lower urinary tract infection)    hx of    Past Surgical History:  Past Surgical History:  Procedure Laterality Date  . CARDIAC CATHETERIZATION  02/18/14  . CARDIOTHORACIC PROCEDURE  01/13/2009   Right   . COLECTOMY    . ENDARTERECTOMY     right carotid endartarectomy - 2010  . FOOT SURGERY     nerve cut between toes   . LEFT AND RIGHT HEART CATHETERIZATION WITH CORONARY ANGIOGRAM N/A 02/18/2014   Procedure: LEFT AND RIGHT HEART CATHETERIZATION WITH CORONARY ANGIOGRAM;  Surgeon: Larey Dresser, MD;  Location[facility] CATH LAB;  Service: Cardiovascular;  Laterality: N/A;  . REPLACEMENT TOTAL KNEE     right and left knee  . TEE WITHOUT CARDIOVERSION N/A 02/08/2014   Procedure: TRANSESOPHAGEAL ECHOCARDIOGRAM (TEE);  Surgeon: Larey Dresser, MD;  Location: [place];  Service: Cardiovascular;  Laterality: N/A;  . TOTAL KNEE  ARTHROPLASTY Left 11/17/2012   Procedure: TOTAL KNEE ARTHROPLASTY;  Surgeon: Gearlean Alf, MD;  Location: [facility];  Service: Orthopedics;  Laterality: Left;  . TUBAL LIGATION    . VAGINAL HYSTERECTOMY     HPI:  85 yo female adm to [facility] with dehydration, intractable N/V, found to have large stool burden. She was manually disimpacted.  Pt also with h/o dementia, bedbound, COPD, prior stroke,.  CT abdomen showed moderate HH, soft tissue thickening at GE, cholelithiasis.  Pt CT head negative, CXR ILD.  Per RN pt toleraing po medications but is not eating much. Pt has 24/7 caregivers.   Assessment / Plan / Recommendation Clinical Impression  Pt presents with clinical indications of cognitive based dysphagia - c/b significant delay in swallow and decreased labial closure with spoon when given medicine by RN.  Caregivers Caryl Pina in the room with pt and advised pt with oral holding at home with decreased intake and weight loss,dehydration.  Caryl Pina states pt's premorbid diet is Adult nurse and states pt will accept nectar thick liquids.    RN reported concerns with pt not swallowing medicine with applesauce. SLP advised to try with icecream- as cold will likely activate sensory system more efficiently and improve pt's transfer of pills.  SLP also provided pt with nectar thick juice *cold juice* prior to  administration of pills. Pt is fidgety and suspect this along with poor intake is contributing to her weight loss.    No overt indication of pharyngeal deficits with intake observed.   Intake observation was minimal due pt needing be repositioned in bed by nurse/caregiver.     Recommend continue dys1/nectar diet.  Educated caregivers x2 to recommendations/compensations.    Provided pt with Magic cup - and requested if she will consume requesting TID order may help with nutrition.    SLP will follow up x1 to attempt to observe pt with meal and for education re: dementia and swallowing.  Thankful for  palliative cosult given pt's progressive dysphagia, dementia and FTT.  Thanks for this consult. SLP Visit Diagnosis: Dysphagia, oral phase (R13.11)    Aspiration Risk  Mild aspiration risk(with precautions)    Diet Recommendation Nectar-thick liquid;Dysphagia 1 (Puree)   Liquid Administration via: Straw Medication Administration: Whole meds with puree(icecream - pt likes vanilla) Compensations: Slow rate;Small sips/bites(check for oral clearance, assure pt swallows before giving more) Postural Changes: Seated upright at 90 degrees    Other  Recommendations Oral Care Recommendations: Oral care BID   Follow up Recommendations    Tbd, doubtful will be indicated    Frequency and Duration min 1 x/week  1 week       Prognosis Prognosis for Safe Diet Advancement: Fair      Swallow Study   General Date of Onset: 10/21/19 HPI: 85 yo female adm to [facility] with dehydration, intractable N/V, found to have large stool burden. She was manually disimpacted.  Pt also with h/o dementia, bedbound, COPD, prior stroke,.  CT abdomen showed moderate HH, soft tissue thickening at GE, cholelithiasis.  Pt CT head negative, CXR ILD.  Per RN pt toleraing po medications but is not eating much. Pt has 24/7 caregivers. Previous Swallow Assessment: MBS 02/06/2017 rec dys1/thin - intermittent cough after few bites/sips, pills with puree, cups preferred Diet Prior to this Study: Thin liquids;Dysphagia 1 (puree) Temperature Spikes Noted: No Respiratory Status: Room air History of Recent Intubation: No Behavior/Cognition: Alert;Cooperative;Pleasant mood Oral Care Completed by SLP: No Oral Cavity - Dentition: Missing dentition Vision: Impaired for self-feeding Self-Feeding Abilities: Total assist Patient Positioning: Upright in bed Baseline Vocal Quality: Other (comment)(pt did not verbalize) Volitional Cough: Cognitively unable to elicit Volitional Swallow: Unable to elicit    Oral/Motor/Sensory Function  Overall Oral Motor/Sensory Function: Generalized oral weakness(pt did not follow directions for oral motor exam, did not seal lips on spoon consistently, able to seal lips on straw however)   Ice Chips Ice chips: Not tested   Thin Liquid Thin Liquid: Not tested Other Comments: caregivers report pt on thickened liquids prior to admission and she coughs with thin    Nectar Thick Nectar Thick Liquid: Impaired Presentation: Straw Oral Phase Impairments: Reduced lingual movement/coordination Oral phase functional implications: Prolonged oral transit Pharyngeal Phase Impairments: Suspected delayed Swallow   Honey Thick Honey Thick Liquid: Not tested   Puree Puree: Impaired Oral Phase Impairments: Reduced lingual movement/coordination Oral Phase Functional Implications: Prolonged oral transit Pharyngeal Phase Impairments: Suspected delayed Swallow   Solid     Solid: Not tested      Macario Golds 10/21/2019,4:57 PM Kathleen Lime, MS [place] Office [phone]

## 2019-10-21 NOTE — Progress Notes (Signed)
Initial Nutrition Assessment  DOCUMENTATION CODES:   Not applicable  INTERVENTION:  Vital Cuisine Shake po daily, each supplement provides 520 kcal and 22 grams of protein  Magic cup TID with meals, each supplement provides 290 kcal and 9 grams of protein  MVI with minerals daily  Order current weight to fully assess needs  Recommend monitoring magnesium, potassium, and phosphorus daily for at least 3 days, MD to replete as needed, as pt is at risk for refeeding syndrome given intractable vomiting and reported decreased po intake and dehydration at home.  NUTRITION DIAGNOSIS:   Inadequate oral intake related to dysphagia, vomiting as evidenced by (per report of caregiver oral holding at home with decreased intake and intractable vomiting secondary to severe constipation/stool impaction).   GOAL:   Patient will meet greater than or equal to 90% of their needs   MONITOR:   Labs, I & O's, Supplement acceptance, Weight trends, PO intake  REASON FOR ASSESSMENT:   Malnutrition Screening Tool    ASSESSMENT:   RD working remotely.  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  Per chart review, overnight vomiting reported. Patient with sitter in room, noted sleepy waking slightly on calling her name but hardly answers any questions. Patient noted nonverbal in ED. Caregiver of pt reports oral holding at home, decreased intake, wt loss, and dehydration. RN reported concerns with pt not swallowing medicine with applesauce. Evaluated by SLP, recommended D1:NT for progressive dysphagia and advised to try medications with ice cream to activate sensory system to improve pt transfer of pills and cold NT juice  prior to administration of pills.   No documented meals at this time for review, noted RN reported patient is not eating much. Will continue to monitor po intakes and provide Magic Cup and Vital Cuisine supplements to aid with estimated needs. Given caregiver report of poor po at home and intractable vomiting, patient is at risk for refeeding. Recommend monitoring K/Mg/P during admission.   Per notes: -mostly bedbound at home -severe constipation/stool impaction causing N/V -increase Miralax BID, add Senokot -hypernatremia d/t poor oral intake - acute lower UTI -PCT for [place]  Admission weight 73.9 kg (162.58 lbs) is from 02/26/2018. Unable to fully assess needs given weight is approaching 85 years old. Will order weight and adjust estimated needs accordingly. Will use IBW to calculate needs at this time.   Medications reviewed and include: Sinemet IR, Lexapro, Gabapentin, Miralax, Senokot IVPB: Rocephin IVF: D5 @ 75 ml/hr provides 306 kcal  Labs: BG 91,107, Na 146 (H), BUN 42 (H), Cr 1.11 (H)  NUTRITION - FOCUSED PHYSICAL EXAM: Unable to complete at this time, RD working remotely.  Diet Order:   Diet Order            DIET - DYS 1 Room service appropriate? Yes; Fluid consistency: Nectar Thick  Diet effective now              EDUCATION NEEDS:   Not appropriate for education at this time  Skin:  Skin Assessment: Reviewed RN Assessment  Last BM:  3/24  Height:   Ht Readings from Last 1 Encounters:  10/21/19 5\' 3"  (1.6 m)    Weight:   Wt Readings from Last 1 Encounters:  02/26/18  73.9 kg    Ideal Body Weight:  52.3 kg  BMI:  Body mass index is 28.87 kg/m.  Estimated Nutritional Needs:   Kcal:  [id number]  Protein:  80-93  Fluid:  >/= 1.6 L/day   Lajuan Lines, RD, LDN Clinical Nutrition After Hours/Weekend Pager # in [place]

## 2019-10-21 NOTE — Evaluation (Signed)
Occupational Therapy Evaluation Patient Details Name: [name] MRN: [id number] DOB: [birth year] Today's Date: 10/21/2019    History of Present Illness 85 year old female with history [name] is a 85 y.o. female with medical history significant for Parkinson disease, depression with anxiety, COPD with chronic hypoxic respiratory failure, hypothyroidism, and dysphasia presented with vomiting and poor oral intake.     Clinical Impression   Pt's caregiver provided prior level of functioning and home environment information. PTA, pt was feeding herself, assisting with all ADL and required minimal assistance for stand pivot transfer to her wheelchair. Pt was not ambulating. Pt has a caregiver 24/7 and lives with her husband. Pt is communicative at baseline using mostly single words but sometimes using up to three word sentences. Pt's caregiver reports pt was more communicative this morning. Pt donned socks while supine, she required minA to progress to EOB and minA for sit<>stand with a face to face transfer. Pt took 3 bites of her food before pushing it away. Pt will continue to benefit from skilled OT services to maximize safety and independence with ADL/IADL and functional mobility. Will continue to follow acutely and progress as tolerated. Anticipate pt will not need additional OT services upon d/c home with continued 24/7 physical assistance.      Follow Up Recommendations  No OT follow up;Supervision/Assistance - 24 hour    Equipment Recommendations  None recommended by OT    Recommendations for Other Services       Precautions / Restrictions Precautions Precautions: Fall Restrictions Weight Bearing Restrictions: No      Mobility Bed Mobility Overal bed mobility: Needs Assistance Bed Mobility: Supine to Sit;Sit to Supine     Supine to sit: Min assist Sit to supine: Min guard   General bed mobility comments: minA to progress trunk to upright  position  Transfers Overall transfer level: Needs assistance Equipment used: 1 person hand held assist Transfers: Sit to/from Stand Sit to Stand: Min assist         General transfer comment: minA with face to face transfer to progress into standing    Balance Overall balance assessment: Needs assistance Sitting-balance support: Single extremity supported;Feet supported Sitting balance-Leahy Scale: Poor Sitting balance - Comments: limited sitting tolerance, sat EOB about 49min total   Standing balance support: Bilateral upper extremity supported Standing balance-Leahy Scale: Poor Standing balance comment: pt stood for about 3 seconds before initiating return to sitting despite max encouragement to remain standing                           ADL either performed or assessed with clinical judgement   ADL Overall ADL's : Needs assistance/impaired Eating/Feeding: Set up;Sitting Eating/Feeding Details (indicate cue type and reason): pt approved for puree food by SLP, pt on thickened liquids at baseline;ate and drank after setupA Grooming: Set up;Sitting   Upper Body Bathing: Minimal assistance;Sitting Upper Body Bathing Details (indicate cue type and reason): minA for thorough cleaning Lower Body Bathing: Minimal assistance;Sit to/from stand   Upper Body Dressing : Min guard;Sitting   Lower Body Dressing: Minimal assistance;Sit to/from stand Lower Body Dressing Details (indicate cue type and reason): pt donned socks while in bed Toilet Transfer: Minimal assistance;Stand-pivot Toilet Transfer Details (indicate cue type and reason): simulated Toileting- Clothing Manipulation and Hygiene: Minimal assistance;Sit to/from stand       Functional mobility during ADLs: Minimal assistance;Wheelchair General ADL Comments: pt with limited activity tolerance, stood from EOB x3, stood for  about 3 seconds before returning to sitting;after third attempt pt returned to supine;pt is  incontinent     Vision   Additional Comments: difficult to assess as pt keeps her eyes shut majority of the time;pca reports this is baseline     Perception     Praxis      Pertinent Vitals/Pain Pain Assessment: Faces Faces Pain Scale: Hurts a little bit Pain Location: IV site;pt stated "sore" Pain Descriptors / Indicators: Sore Pain Intervention(s): Limited activity within patient's tolerance;Monitored during session     Hand Dominance Right   Extremity/Trunk Assessment Upper Extremity Assessment Upper Extremity Assessment: Generalized weakness(within functional limits)   Lower Extremity Assessment Lower Extremity Assessment: Generalized weakness   Cervical / Trunk Assessment Cervical / Trunk Assessment: Kyphotic   Communication Communication Communication: Expressive difficulties   Cognition Arousal/Alertness: Awake/alert Behavior During Therapy: Flat affect Overall Cognitive Status: History of cognitive impairments - at baseline                                 General Comments: pt able to follow commands with increased time;difficult to assess due to expressive limitations;hx of dementia    General Comments  vss SpO2 98% RA    Exercises Exercises: Other exercises Other Exercises Other Exercises: educated pca to keep pt in fully upright posture while eating and for 88min after meal Other Exercises: educated pca on changing pt's depends frequently to ensure the area is clean and dry   Shoulder Instructions      Home Living Family/patient expects to be discharged to:: Private residence Living Arrangements: Spouse/significant other Available Help at Discharge: Personal care attendant;Available 24 hours/day Type of Home: House       Home Layout: One level     Bathroom Shower/Tub: Teacher, early years/pre: Standard     Home Equipment: Wheelchair - manual          Prior Functioning/Environment Level of Independence: Needs  assistance  Gait / Transfers Assistance Needed: w/c for mobility with assistance from PCA for transfers (stand pivot transfer) ADL's / Homemaking Assistance Needed: pt requires supervision to minguard with seated ADL, she requires totalA for all IADL Communication / Swallowing Assistance Needed: pt says up to three word phrases Comments: information provided by pt's pca, Caryl Pina, who was present throughout the session        OT Problem List: Decreased strength;Decreased activity tolerance;Impaired balance (sitting and/or standing);Decreased cognition;Decreased knowledge of use of DME or AE;Decreased safety awareness;Decreased knowledge of precautions      OT Treatment/Interventions: Self-care/ADL training;Therapeutic exercise;Therapeutic activities;Patient/family education;Balance training;Cognitive remediation/compensation;Visual/perceptual remediation/compensation    OT Goals(Current goals can be found in the care plan section) Acute Rehab OT Goals Patient Stated Goal: pt did not state;PCA's goal is for pt to verbally communicate more OT Goal Formulation: With patient Time For Goal Achievement: 11/04/19 Potential to Achieve Goals: Fair ADL Goals Pt Will Perform Grooming: with supervision;sitting Pt Will Perform Upper Body Dressing: with supervision;sitting Pt Will Transfer to Toilet: with min assist;stand pivot transfer Additional ADL Goal #1: Pt will complete ADL for 5 to 42minutes while sitting EOB/in wheelchair with minguard assistance.  OT Frequency: Min 2X/week   Barriers to D/C:            Co-evaluation              AM-PAC OT "6 Clicks" Daily Activity     Outcome Measure Help from another person eating  meals?: A Little Help from another person taking care of personal grooming?: A Little Help from another person toileting, which includes using toliet, bedpan, or urinal?: A Little Help from another person bathing (including washing, rinsing, drying)?: A Little Help  from another person to put on and taking off regular upper body clothing?: A Little Help from another person to put on and taking off regular lower body clothing?: A Little 6 Click Score: 18   End of Session Nurse Communication: Mobility status  Activity Tolerance: Patient tolerated treatment well;Patient limited by fatigue Patient left: in bed;with call bell/phone within reach;with nursing/sitter in room  OT Visit Diagnosis: Unsteadiness on feet (R26.81);Other abnormalities of gait and mobility (R26.89);Muscle weakness (generalized) (M62.81);Low vision, both eyes (H54.2);Other symptoms and signs involving cognitive function;Feeding difficulties (R63.3)                Time: [id number] OT Time Calculation (min): 29 min Charges:  OT General Charges $OT Visit: 1 Visit OT Evaluation $OT Eval Moderate Complexity: 1 Mod OT Treatments $Self Care/Home Management : 8-22 mins  Helene Kelp OTR/L Acute Rehabilitation Services Office: [phone]   Wyn Forster 10/21/2019, 3:59 PM

## 2019-10-21 NOTE — Progress Notes (Signed)
Patient ID: [name], female   DOB: [birth year], 85 y.o.   MRN: [id number]  PROGRESS NOTE    Wakeelah Bryner  [id number] DOB: 10-01-1934 DOA: 10/20/2019 PCP: Ferd Hibbs, NP   Brief Narrative:  85 year old female with history of dementia, mostly bedbound at home, COPD presented with vomiting and poor oral intake.  In the ED she was found to have large stool ball in rectum, AKI and hyponatremia.  Her sodium was 147 with creatinine of 1.46.  She was started on IV fluids.  Assessment & Plan:   Intractable nausea/vomiting Severe constipation/stool impaction causing above -Large stool ball in rectum: Patient had some bowel movement after ED provider tried manual disimpaction and enemas -Increase MiraLAX to twice a day.  Will add Senokot. -Continue antiemetics as needed.  Advance diet as tolerated.  SLP evaluation  Acute kidney injury Hypernatremia -From poor oral intake.  Presented with creatinine of 1.46.  Improving to 1.11 today.  Change IV fluids to D5 at 75 cc an hour.  Monitor sodium in a.m.  Acute lower UTI -Follow cultures.  Continue Rocephin  Hypothyroidism -Resume Synthroid once able to take orally  Depression -Resume Lexapro once able to take orally  Parkinson's disease Dementia: Most likely secondary Parkinson's disease -Continue Sinemet.  Continue as needed Ativan and Seroquel  Generalized deconditioning -Patient is apparently bedbound.  Will get OT eval.  Palliative care consult for goals of care discussion.   DVT prophylaxis: Lovenox Code Status: DNR Family Communication: None at bedside Disposition Plan: Probable discharge home in 1 to 2 days if able to tolerate diet and stop vomiting.  Patient not ready for discharge today as she is still on IV antibiotics, IV fluids with hypernatremia and very poor oral intake.  Consultants: None  Procedures: None  Antimicrobials: Rocephin from 10/20/2019 onwards   Subjective: Patient seen and examined at bedside.   Sitter present at bedside.  No overnight fever, vomiting reported.  Apparently patient had bowel movement yesterday.  Sleepy, wakes up very slightly on calling her name, hardly answers any questions.  Objective: Vitals:   10/20/19 1900 10/20/19 1903 10/20/19 2011 10/21/19 0520  BP: (!) 129/47 (!) 129/47 (!) 128/94 101/72  Pulse:  90 89 65  Resp:  20 20 20   Temp:   98.1 F (36.7 C) 97.8 F (36.6 C)  TempSrc:   Oral Oral  SpO2:  99% 97% 100%   No intake or output data in the 24 hours ending 10/21/19 1055 There were no vitals filed for this visit.  Examination:  General exam: Appears calm and comfortable.  No distress. Respiratory system: Bilateral decreased breath sounds at bases Cardiovascular system: S1 & S2 heard, Rate controlled Gastrointestinal system: Abdomen is nondistended, soft and nontender. Normal bowel sounds heard. Extremities: No cyanosis, clubbing, edema  Central nervous system:Sleepy, wakes up very slightly on calling her name, hardly answers any questions. No focal neurological deficits. Moving extremities Skin: No rashes, lesions or ulcers Psychiatry: Could not be assessed because of mental status    Data Reviewed: I have personally reviewed following labs and imaging studies  CBC: Recent Labs  Lab 10/20/19 1246 10/21/19 0551  WBC 9.5 7.7  NEUTROABS 7.1  --   HGB 13.2 11.0*  HCT 39.9 34.2*  MCV 98.5 99.4  PLT 254 [id number]   Basic Metabolic Panel: Recent Labs  Lab 10/20/19 1246 10/21/19 0551  NA 147* 146*  K 4.2 5.1  CL 109 114*  CO2 24 24  GLUCOSE 107* 91  BUN 63*  42*  CREATININE 1.46* 1.11*  CALCIUM 9.8 9.2   GFR: CrCl cannot be calculated (Unknown ideal weight.). Liver Function Tests: Recent Labs  Lab 10/20/19 1246  AST 15  ALT 6  ALKPHOS 83  BILITOT 0.8  PROT 8.1  ALBUMIN 4.2   No results for input(s): LIPASE, AMYLASE in the last 168 hours. No results for input(s): AMMONIA in the last 168 hours. Coagulation Profile: No results  for input(s): INR, PROTIME in the last 168 hours. Cardiac Enzymes: No results for input(s): CKTOTAL, CKMB, CKMBINDEX, TROPONINI in the last 168 hours. BNP (last 3 results) No results for input(s): PROBNP in the last 8760 hours. HbA1C: No results for input(s): HGBA1C in the last 72 hours. CBG: No results for input(s): GLUCAP in the last 168 hours. Lipid Profile: No results for input(s): CHOL, HDL, LDLCALC, TRIG, CHOLHDL, LDLDIRECT in the last 72 hours. Thyroid Function Tests: No results for input(s): TSH, T4TOTAL, FREET4, T3FREE, THYROIDAB in the last 72 hours. Anemia Panel: No results for input(s): VITAMINB12, FOLATE, FERRITIN, TIBC, IRON, RETICCTPCT in the last 72 hours. Sepsis Labs: No results for input(s): PROCALCITON, LATICACIDVEN in the last 168 hours.  Recent Results (from the past 240 hour(s))  SARS CORONAVIRUS 2 (TAT 6-24 HRS) Nasopharyngeal Nasopharyngeal Swab     Status: None   Collection Time: 10/20/19  2:28 PM   Specimen: Nasopharyngeal Swab  Result Value Ref Range Status   SARS Coronavirus 2 NEGATIVE NEGATIVE Final    Comment: (NOTE) SARS-CoV-2 target nucleic acids are NOT DETECTED. The SARS-CoV-2 RNA is generally detectable in upper and lower respiratory specimens during the acute phase of infection. Negative results do not preclude SARS-CoV-2 infection, do not rule out co-infections with other pathogens, and should not be used as the sole basis for treatment or other patient management decisions. Negative results must be combined with clinical observations, patient history, and epidemiological information. The expected result is Negative. Fact Sheet for Patients: SugarRoll.be Fact Sheet for Healthcare Providers: https://www.woods-mathews.com/ This test is not yet approved or cleared by the [place] FDA and  has been authorized for detection and/or diagnosis of SARS-CoV-2 by FDA under an Emergency Use Authorization  (EUA). This EUA will remain  in effect (meaning this test can be used) for the duration of the COVID-19 declaration under Section 56 4(b)(1) of the Act, 21 U.S.C. section 360bbb-3(b)(1), unless the authorization is terminated or revoked sooner. Performed at [facility], [place] [address], [place], [place] [address]          Radiology Studies: CT Head Wo Contrast  Result Date: 10/20/2019 CLINICAL DATA:  Anorexia for 4 days, altered level of consciousness EXAM: CT HEAD WITHOUT CONTRAST TECHNIQUE: Contiguous axial images were obtained from the base of the skull through the vertex without intravenous contrast. COMPARISON:  02/26/2018 FINDINGS: Brain: No acute infarct or hemorrhage. Chronic small vessel ischemic changes are seen within the periventricular white matter. Lateral ventricles and midline structures appear unremarkable. No acute extra-axial fluid collections. No mass effect. Vascular: No hyperdense vessel or unexpected calcification. Skull: Normal. Negative for fracture or focal lesion. Sinuses/Orbits: No acute finding. Other: None IMPRESSION: 1. Stable head CT, no acute process. Electronically Signed   By: Randa Ngo M.D.   On: 10/20/2019 14:34   CT Abdomen Pelvis W Contrast  Result Date: 10/20/2019 CLINICAL DATA:  Altered mental status, vomiting, decreased appetite EXAM: CT ABDOMEN AND PELVIS WITH CONTRAST TECHNIQUE: Multidetector CT imaging of the abdomen and pelvis was performed using the standard protocol following  bolus administration of intravenous contrast. CONTRAST:  39mL OMNIPAQUE IOHEXOL 300 MG/ML  SOLN COMPARISON:  10/17/2015 FINDINGS: Lower chest: Small to moderate-sized hiatal hernia. There is asymmetric soft tissue thickening along the right lateral aspect the level of the GE junction, poorly characterized on the cranial most axial image (series 2, image 1). Mild bibasilar scarring. Heart size normal. Hepatobiliary: Liver has an unremarkable appearance without focal  hepatic lesion. There is a very small amount of layering hyperdense material within the gallbladder lumen. No biliary dilatation. Pancreas: Unremarkable. No pancreatic ductal dilatation or surrounding inflammatory changes. Spleen: Normal in size without focal abnormality. Adrenals/Urinary Tract: Unremarkable adrenal glands. Mild bilateral renal cortical atrophy. Tiny cyst within the inferior pole the right kidney. Kidneys are otherwise unremarkable. No stone or hydronephrosis. Urinary bladder is anteriorly displaced by large rectal stool ball, but appears otherwise unremarkable. Stomach/Bowel: Rectum is distended with a prominent stool ball measuring 7.8 x 6.2 cm transaxially. Moderate volume of stool throughout the remainder of the colon. No focal bowel wall thickening or inflammatory changes are seen. Appendix was not definitively visualized. No bowel distension to suggest obstruction. Vascular/Lymphatic: Aortic atherosclerosis without aneurysm. No abdominopelvic lymphadenopathy. Reproductive: Status post hysterectomy. No adnexal masses. Other: No abdominal wall hernia or abnormality. No abdominopelvic ascites. Musculoskeletal: 8 mm anterolisthesis L4 on L5. Degenerative disc disease of the lower lumbar spine. No acute osseous findings. IMPRESSION: 1. Rectum is distended with a large stool ball measuring 7.8 x 6.2 cm transaxially. Moderate volume of stool throughout the remainder of the colon suggesting constipation. 2. Small-to-moderate hiatal hernia with asymmetric soft tissue thickening at the GE junction, poorly characterized on the cranial-most axial image. Consider direct visualization. 3. Cholelithiasis Aortic Atherosclerosis (ICD10-I70.0). Electronically Signed   By: Davina Poke D.O.   On: 10/20/2019 14:45   DG Chest Port 1 View  Result Date: 10/20/2019 CLINICAL DATA:  Anorexia for 3-4 days, vomiting, Alzheimer's EXAM: PORTABLE CHEST 1 VIEW COMPARISON:  03/24/2019 FINDINGS: Single frontal view of  the chest demonstrates an unremarkable cardiac silhouette. No airspace disease, effusion, or pneumothorax. Chronic interstitial prominence. No acute bony abnormalities. IMPRESSION: 1. Chronic interstitial lung disease.  No acute process. Electronically Signed   By: Randa Ngo M.D.   On: 10/20/2019 13:04        Scheduled Meds: . aspirin EC  81 mg Oral Daily  . carbidopa-levodopa  2 tablet Oral TID  . enoxaparin (LOVENOX) injection  30 mg Subcutaneous Q24H  . escitalopram  20 mg Oral Daily  . gabapentin  100 mg Oral BID  . levothyroxine  88 mcg Oral Q0600  . mometasone-formoterol  2 puff Inhalation BID  . polyethylene glycol  17 g Oral Daily   Continuous Infusions: . cefTRIAXone (ROCEPHIN)  IV    . dextrose 75 mL/hr at 10/21/19 0954          Aline August, MD [facility] 10/21/2019, 10:55 AM

## 2019-10-22 LAB — CBC WITH DIFFERENTIAL/PLATELET
Abs Immature Granulocytes: 0.02 10*3/uL (ref 0.00–0.07)
Basophils Absolute: 0 10*3/uL (ref 0.0–0.1)
Basophils Relative: 1 %
Eosinophils Absolute: 0.3 10*3/uL (ref 0.0–0.5)
Eosinophils Relative: 6 %
HCT: 30.4 % — ABNORMAL LOW (ref 36.0–46.0)
Hemoglobin: 9.9 g/dL — ABNORMAL LOW (ref 12.0–15.0)
Immature Granulocytes: 0 %
Lymphocytes Relative: 29 %
Lymphs Abs: 1.4 10*3/uL (ref 0.7–4.0)
MCH: 32 pg (ref 26.0–34.0)
MCHC: 32.6 g/dL (ref 30.0–36.0)
MCV: 98.4 fL (ref 80.0–100.0)
Monocytes Absolute: 0.3 10*3/uL (ref 0.1–1.0)
Monocytes Relative: 7 %
Neutro Abs: 2.6 10*3/uL (ref 1.7–7.7)
Neutrophils Relative %: 57 %
Platelets: 176 10*3/uL (ref 150–400)
RBC: 3.09 MIL/uL — ABNORMAL LOW (ref 3.87–5.11)
RDW: 12.8 % (ref 11.5–15.5)
WBC: 4.6 10*3/uL (ref 4.0–10.5)
nRBC: 0 % (ref 0.0–0.2)

## 2019-10-22 LAB — COMPREHENSIVE METABOLIC PANEL
ALT: 5 U/L (ref 0–44)
AST: 12 U/L — ABNORMAL LOW (ref 15–41)
Albumin: 2.9 g/dL — ABNORMAL LOW (ref 3.5–5.0)
Alkaline Phosphatase: 53 U/L (ref 38–126)
Anion gap: 7 (ref 5–15)
BUN: 25 mg/dL — ABNORMAL HIGH (ref 8–23)
CO2: 24 mmol/L (ref 22–32)
Calcium: 8.3 mg/dL — ABNORMAL LOW (ref 8.9–10.3)
Chloride: 107 mmol/L (ref 98–111)
Creatinine, Ser: 0.96 mg/dL (ref 0.44–1.00)
GFR calc Af Amer: >60 mL/min (ref >60)
GFR calc non Af Amer: 55 mL/min — ABNORMAL LOW (ref >60)
Glucose, Bld: 106 mg/dL — ABNORMAL HIGH (ref 70–99)
Potassium: 3.4 mmol/L — ABNORMAL LOW (ref 3.5–5.1)
Sodium: 138 mmol/L (ref 135–145)
Total Bilirubin: 0.6 mg/dL (ref 0.3–1.2)
Total Protein: 5.6 g/dL — ABNORMAL LOW (ref 6.5–8.1)

## 2019-10-22 LAB — MAGNESIUM: Magnesium: 1.9 mg/dL (ref 1.7–2.4)

## 2019-10-22 MED ORDER — POTASSIUM CHLORIDE CRYS ER 20 MEQ PO TBCR
40.0000 meq | EXTENDED_RELEASE_TABLET | Freq: Once | ORAL | Status: AC
  Administered 2019-10-22: 40 meq via ORAL
  Filled 2019-10-22: qty 2

## 2019-10-22 NOTE — Progress Notes (Signed)
Patient ID: [name], female   DOB: 02/03/1935, 85 y.o.   MRN: [id number]  PROGRESS NOTE    Dessirae Raina  [id number] DOB: 1935/01/27 DOA: 10/20/2019 PCP: Ferd Hibbs, NP   Brief Narrative:  85 year old female with history of dementia, mostly bedbound at home, COPD presented with vomiting and poor oral intake.  In the ED she was found to have large stool ball in rectum, AKI and hyponatremia.  Her sodium was 147 with creatinine of 1.46.  She was started on IV fluids.  Assessment & Plan:   Intractable nausea/vomiting Severe constipation/stool impaction causing above -Large stool ball in rectum: Patient had some bowel movement after ED provider tried manual disimpaction and enemas -Continue twice daily MiraLAX and Senokot. -Continue antiemetics as needed.  SLP following. -Oral intake is improving but still not back to her baseline yet.  Continue IV fluids as below.  Acute kidney injury Hypernatremia -From poor oral intake.  Presented with creatinine of 1.46.  Improving to 0.96.  Today.  Change IV fluids to D5 at 50 cc an hour.  Sodium level has improved.  Acute lower UTI -Urine cultures growing multiple species.  Continue Rocephin for 3 days.  Hypothyroidism -Continue Synthroid   Depression -Continue Lexapro  Parkinson's disease Dementia: Most likely secondary Parkinson's disease -Continue Sinemet.  Continue as needed Ativan and Seroquel  Generalized deconditioning -Patient is apparently bedbound.  Palliative care consult for goals of care discussion.   DVT prophylaxis: Lovenox Code Status: DNR Family Communication: Spoke to sitter at bedside. Disposition Plan: Probable discharge home in 1 to 2 days if able to tolerate diet better.  Patient not ready for discharge today as she is still on IV antibiotics, IV fluids and oral intake is not back to her baseline yet.  Consultants: Palliative care evaluation pending.  Procedures: None  Antimicrobials: Rocephin from  10/20/2019 onwards   Subjective: Patient seen and examined at bedside.  Sitter present at bedside.  She states that her mental status is slightly improving but her oral intake is still not that great yet.  Patient is a poor historian.  No overnight fever or vomiting noted.  Objective: Vitals:   10/21/19 2300 10/22/19 0443 10/22/19 0832 10/22/19 0834  BP: (!) 94/44 (!) 103/50    Pulse: 72 (!) 58    Resp: 18 17    Temp: (!) 97.5 F (36.4 C) 97.8 F (36.6 C)    TempSrc: Oral Oral    SpO2: 98% 96% 95% 95%  Height:        Intake/Output Summary (Last 24 hours) at 10/22/2019 1107 Last data filed at 10/22/2019 0300 Gross per 24 hour  Intake 1338.29 ml  Output --  Net 1338.29 ml   There were no vitals filed for this visit.  Examination:  General exam: Appears calm and comfortable.  No acute distress.  Elderly female lying in bed. Respiratory system: Bilateral decreased breath sounds at bases with some scattered crackles Cardiovascular system: S1 & S2 heard, intermittently bradycardic Gastrointestinal system: Abdomen is nondistended, soft and nontender.  Bowel sounds are heard  extremities: No clubbing or edema Central nervous system:Sleepy, wakes up very slightly on calling her name, follows some commands but hardly answers any questions.  No obvious focal neurologic deficit.   Skin: No ulcers, rashes Psychiatry: Cannot assess because of mental status    Data Reviewed: I have personally reviewed following labs and imaging studies  CBC: Recent Labs  Lab 10/20/19 1246 10/21/19 0551 10/22/19 0514  WBC 9.5 7.7 4.6  NEUTROABS 7.1  --  2.6  HGB 13.2 11.0* 9.9*  HCT 39.9 34.2* 30.4*  MCV 98.5 99.4 98.4  PLT 254 202 0000000   Basic Metabolic Panel: Recent Labs  Lab 10/20/19 1246 10/21/19 0551 10/22/19 0514  NA 147* 146* 138  K 4.2 5.1 3.4*  CL 109 114* 107  CO2 24 24 24   GLUCOSE 107* 91 106*  BUN 63* 42* 25*  CREATININE 1.46* 1.11* 0.96  CALCIUM 9.8 9.2 8.3*  MG  --    --  1.9   GFR: CrCl cannot be calculated (Unknown ideal weight.). Liver Function Tests: Recent Labs  Lab 10/20/19 1246 10/22/19 0514  AST 15 12*  ALT 6 5  ALKPHOS 83 53  BILITOT 0.8 0.6  PROT 8.1 5.6*  ALBUMIN 4.2 2.9*   No results for input(s): LIPASE, AMYLASE in the last 168 hours. No results for input(s): AMMONIA in the last 168 hours. Coagulation Profile: No results for input(s): INR, PROTIME in the last 168 hours. Cardiac Enzymes: No results for input(s): CKTOTAL, CKMB, CKMBINDEX, TROPONINI in the last 168 hours. BNP (last 3 results) No results for input(s): PROBNP in the last 8760 hours. HbA1C: No results for input(s): HGBA1C in the last 72 hours. CBG: No results for input(s): GLUCAP in the last 168 hours. Lipid Profile: No results for input(s): CHOL, HDL, LDLCALC, TRIG, CHOLHDL, LDLDIRECT in the last 72 hours. Thyroid Function Tests: No results for input(s): TSH, T4TOTAL, FREET4, T3FREE, THYROIDAB in the last 72 hours. Anemia Panel: No results for input(s): VITAMINB12, FOLATE, FERRITIN, TIBC, IRON, RETICCTPCT in the last 72 hours. Sepsis Labs: No results for input(s): PROCALCITON, LATICACIDVEN in the last 168 hours.  Recent Results (from the past 240 hour(s))  Urine culture     Status: Abnormal   Collection Time: 10/20/19 12:46 PM   Specimen: Urine, Random  Result Value Ref Range Status   Specimen Description   Final    URINE, RANDOM Performed at [facility] [address], [place], [place] [address]    Special Requests   Final    NONE Performed at [facility], [place] [address], [place], [place] [address]    Culture MULTIPLE SPECIES PRESENT, SUGGEST RECOLLECTION (A)  Final   Report Status 10/21/2019 FINAL  Final  SARS CORONAVIRUS 2 (TAT 6-24 HRS) Nasopharyngeal Nasopharyngeal Swab     Status: None   Collection Time: 10/20/19  2:28 PM   Specimen: Nasopharyngeal Swab  Result Value Ref Range Status   SARS Coronavirus 2  NEGATIVE NEGATIVE Final    Comment: (NOTE) SARS-CoV-2 target nucleic acids are NOT DETECTED. The SARS-CoV-2 RNA is generally detectable in upper and lower respiratory specimens during the acute phase of infection. Negative results do not preclude SARS-CoV-2 infection, do not rule out co-infections with other pathogens, and should not be used as the sole basis for treatment or other patient management decisions. Negative results must be combined with clinical observations, patient history, and epidemiological information. The expected result is Negative. Fact Sheet for Patients: SugarRoll.be Fact Sheet for Healthcare Providers: https://www.woods-mathews.com/ This test is not yet approved or cleared by the [place] FDA and  has been authorized for detection and/or diagnosis of SARS-CoV-2 by FDA under an Emergency Use Authorization (EUA). This EUA will remain  in effect (meaning this test can be used) for the duration of the COVID-19 declaration under Section 56 4(b)(1) of the Act, 21 U.S.C. section 360bbb-3(b)(1), unless the authorization is terminated or revoked sooner. Performed at [facility]  [facility], [place] [address] [place], [place] [address]          Radiology Studies: CT Head Wo Contrast  Result Date: 10/20/2019 CLINICAL DATA:  Anorexia for 4 days, altered level of consciousness EXAM: CT HEAD WITHOUT CONTRAST TECHNIQUE: Contiguous axial images were obtained from the base of the skull through the vertex without intravenous contrast. COMPARISON:  02/26/2018 FINDINGS: Brain: No acute infarct or hemorrhage. Chronic small vessel ischemic changes are seen within the periventricular white matter. Lateral ventricles and midline structures appear unremarkable. No acute extra-axial fluid collections. No mass effect. Vascular: No hyperdense vessel or unexpected calcification. Skull: Normal. Negative for fracture or focal lesion. Sinuses/Orbits:  No acute finding. Other: None IMPRESSION: 1. Stable head CT, no acute process. Electronically Signed   By: Randa Ngo M.D.   On: 10/20/2019 14:34   CT Abdomen Pelvis W Contrast  Result Date: 10/20/2019 CLINICAL DATA:  Altered mental status, vomiting, decreased appetite EXAM: CT ABDOMEN AND PELVIS WITH CONTRAST TECHNIQUE: Multidetector CT imaging of the abdomen and pelvis was performed using the standard protocol following bolus administration of intravenous contrast. CONTRAST:  95mL OMNIPAQUE IOHEXOL 300 MG/ML  SOLN COMPARISON:  10/17/2015 FINDINGS: Lower chest: Small to moderate-sized hiatal hernia. There is asymmetric soft tissue thickening along the right lateral aspect the level of the GE junction, poorly characterized on the cranial most axial image (series 2, image 1). Mild bibasilar scarring. Heart size normal. Hepatobiliary: Liver has an unremarkable appearance without focal hepatic lesion. There is a very small amount of layering hyperdense material within the gallbladder lumen. No biliary dilatation. Pancreas: Unremarkable. No pancreatic ductal dilatation or surrounding inflammatory changes. Spleen: Normal in size without focal abnormality. Adrenals/Urinary Tract: Unremarkable adrenal glands. Mild bilateral renal cortical atrophy. Tiny cyst within the inferior pole the right kidney. Kidneys are otherwise unremarkable. No stone or hydronephrosis. Urinary bladder is anteriorly displaced by large rectal stool ball, but appears otherwise unremarkable. Stomach/Bowel: Rectum is distended with a prominent stool ball measuring 7.8 x 6.2 cm transaxially. Moderate volume of stool throughout the remainder of the colon. No focal bowel wall thickening or inflammatory changes are seen. Appendix was not definitively visualized. No bowel distension to suggest obstruction. Vascular/Lymphatic: Aortic atherosclerosis without aneurysm. No abdominopelvic lymphadenopathy. Reproductive: Status post hysterectomy. No  adnexal masses. Other: No abdominal wall hernia or abnormality. No abdominopelvic ascites. Musculoskeletal: 8 mm anterolisthesis L4 on L5. Degenerative disc disease of the lower lumbar spine. No acute osseous findings. IMPRESSION: 1. Rectum is distended with a large stool ball measuring 7.8 x 6.2 cm transaxially. Moderate volume of stool throughout the remainder of the colon suggesting constipation. 2. Small-to-moderate hiatal hernia with asymmetric soft tissue thickening at the GE junction, poorly characterized on the cranial-most axial image. Consider direct visualization. 3. Cholelithiasis Aortic Atherosclerosis (ICD10-I70.0). Electronically Signed   By: Davina Poke D.O.   On: 10/20/2019 14:45   DG Chest Port 1 View  Result Date: 10/20/2019 CLINICAL DATA:  Anorexia for 3-4 days, vomiting, Alzheimer's EXAM: PORTABLE CHEST 1 VIEW COMPARISON:  03/24/2019 FINDINGS: Single frontal view of the chest demonstrates an unremarkable cardiac silhouette. No airspace disease, effusion, or pneumothorax. Chronic interstitial prominence. No acute bony abnormalities. IMPRESSION: 1. Chronic interstitial lung disease.  No acute process. Electronically Signed   By: Randa Ngo M.D.   On: 10/20/2019 13:04        Scheduled Meds: . aspirin EC  81 mg Oral Daily  . carbidopa-levodopa  2 tablet Oral TID  . enoxaparin (LOVENOX) injection  30 mg Subcutaneous Q24H  . escitalopram  20 mg Oral Daily  . gabapentin  100 mg Oral BID  . levothyroxine  88 mcg Oral Q0600  . mometasone-formoterol  2 puff Inhalation BID  . multivitamin with minerals  1 tablet Oral Daily  . polyethylene glycol  17 g Oral BID  . senna-docusate  1 tablet Oral BID   Continuous Infusions: . cefTRIAXone (ROCEPHIN)  IV Stopped (10/21/19 1522)  . dextrose 75 mL/hr at 10/22/19 0300          Aline August, MD [facility] 10/22/2019, 11:07 AM

## 2019-10-23 ENCOUNTER — Telehealth: Payer: Self-pay | Admitting: Internal Medicine

## 2019-10-23 DIAGNOSIS — K5900 Constipation, unspecified: Secondary | ICD-10-CM

## 2019-10-23 LAB — BASIC METABOLIC PANEL
Anion gap: 6 (ref 5–15)
BUN: 15 mg/dL (ref 8–23)
CO2: 25 mmol/L (ref 22–32)
Calcium: 8.5 mg/dL — ABNORMAL LOW (ref 8.9–10.3)
Chloride: 106 mmol/L (ref 98–111)
Creatinine, Ser: 0.96 mg/dL (ref 0.44–1.00)
GFR calc Af Amer: >60 mL/min (ref >60)
GFR calc non Af Amer: 55 mL/min — ABNORMAL LOW (ref >60)
Glucose, Bld: 98 mg/dL (ref 70–99)
Potassium: 3.3 mmol/L — ABNORMAL LOW (ref 3.5–5.1)
Sodium: 137 mmol/L (ref 135–145)

## 2019-10-23 LAB — MAGNESIUM: Magnesium: 2.1 mg/dL (ref 1.7–2.4)

## 2019-10-23 MED ORDER — SENNOSIDES-DOCUSATE SODIUM 8.6-50 MG PO TABS: 1.0000 | ORAL_TABLET | Freq: Two times a day (BID) | ORAL | 0 refills | Status: DC

## 2019-10-23 MED ORDER — POTASSIUM CHLORIDE CRYS ER 20 MEQ PO TBCR
40.0000 meq | EXTENDED_RELEASE_TABLET | Freq: Once | ORAL | Status: AC
  Administered 2019-10-23: 40 meq via ORAL
  Filled 2019-10-23: qty 2

## 2019-10-23 MED ORDER — ONDANSETRON HCL 4 MG PO TABS: 4.0000 mg | ORAL_TABLET | Freq: Four times a day (QID) | ORAL | 0 refills | Status: DC | PRN

## 2019-10-23 MED ORDER — POLYETHYLENE GLYCOL 3350 17 G PO PACK: 17.0000 g | PACK | Freq: Two times a day (BID) | ORAL | 0 refills | Status: AC

## 2019-10-23 NOTE — Progress Notes (Signed)
OT Cancellation Note  Patient Details Name: [name] MRN: [id number] DOB: [birth year]   Cancelled Treatment:     Attempt to see patient this AM, when asked if patient would like to get out of bed she covers her face with her arm. Caregiver present and states "I don't think she wants to get out of bed." Patient is due to discharge today, spoke with caregiver if she had any concerns, she politely declines and states patient is close to her baseline with transfers, self care. Will follow up tomorrow if time allows/pt still admitted.  [place] OT office: [place] 10/23/2019, 11:29 AM

## 2019-10-23 NOTE — Discharge Summary (Signed)
Physician Discharge Summary  [name] [id number] DOB: February 23, 1935 DOA: 10/20/2019  PCP: Ferd Hibbs, NP  Admit date: 10/20/2019 Discharge date: 10/23/2019  Admitted From: Home Disposition: Home  Recommendations for Outpatient Follow-up:  1. Follow up with PCP in 1 week with repeat CBC/BMP 2. Recommend outpatient evaluation and follow-up with palliative care 3. Follow up in ED if symptoms worsen or new appear   Home Health: No Equipment/Devices: None  Discharge Condition: Guarded to poor CODE STATUS: DNR  diet recommendation: Heart healthy As per SLP recommendations: Nectar-thick liquid;Dysphagia 1 (Puree)   Liquid Administration via: Straw Medication Administration: Whole meds with puree(icecream - pt likes vanilla) Compensations: Slow rate;Small sips/bites(check for oral clearance, assure pt swallows before giving more) Postural Changes: Seated upright at 90 degrees   Brief/Interim Summary: 85 year old female with history of dementia, mostly bedbound at home, COPD presented with vomiting and poor oral intake.  In the ED she was found to have large stool ball in rectum, AKI and hyponatremia.  Her sodium was 147 with creatinine of 1.46.  She was started on IV fluids.  During hospitalization, her symptoms improved.  No further vomiting.  She is having bowel movements.  She was treated with IV Rocephin.  Her mental status is probably back to her baseline.  Her oral intake is still not back to her baseline but improving.  She will be discharged home.  Palliative care evaluation is pending but this can happen as an outpatient.  Discharge Diagnoses:   Intractable nausea/vomiting Severe constipation/stool impaction causing above -Large stool ball in rectum: Patient had some bowel movement after ED provider tried manual disimpaction and enemas -Treated with IV fluids during the hospitalization.  Vomiting has much improved during the hospitalization.  Her oral intake is still  not back to her baseline but improving. -Continue twice daily MiraLAX and Senokot on discharge. -Discharge patient home today.  Acute kidney injury Hypernatremia -From poor oral intake.  Presented with creatinine of 1.46.    Creatinine and sodium levels have much improved.  Treated with IV fluids.  Outpatient follow-up.  Encourage oral intake.  Acute lower UTI -Urine cultures growing multiple species.    Treated with Rocephin for 3 days.  No need for further antibiotics on discharge   Hypothyroidism -Continue Synthroid   Depression -Continue Lexapro  Parkinson's disease Dementia: Most likely secondary Parkinson's disease -Continue Sinemet.  Continue outpatient regimen.  Outpatient follow-up.  Generalized deconditioning -Patient is apparently bedbound.  Palliative care consult for goals of care discussion is pending. -Palliative care evaluation can happen as an outpatient.  Recommend home hospice if condition were to get home.  Overall prognosis is very poor.   Discharge Instructions  Discharge Instructions    Amb Referral to Palliative Care   Complete by: As directed    Goals of care discussion. ? Qualifies for home hospice   Diet - low sodium heart healthy   Complete by: As directed      Allergies as of 10/23/2019      Reactions   Relafen [nabumetone] Diarrhea, Other (See Comments)   GI / Urinary Bleeding   Lipitor [atorvastatin] Other (See Comments)   Causes memory loss   Other Other (See Comments)   "orvail" unknown:  Causes Bloody stool   Penicillins Hives, Itching, Swelling, Rash   Has patient had a PCN reaction causing immediate rash, facial/tongue/throat swelling, SOB or lightheadedness with hypotension: no Has patient had a PCN reaction causing severe rash involving mucus membranes or skin necrosis: unknown Has  patient had a PCN reaction that required hospitalization no Has patient had a PCN reaction occurring within the last 10 years: no If all of the  above answers are "NO", then may proceed with Cephalosporin use.      Medication List    TAKE these medications   Advair HFA 230-21 MCG/ACT inhaler Generic drug: fluticasone-salmeterol Inhale 2 puffs into the lungs 2 (two) times daily.   aspirin EC 81 MG tablet Take 81 mg by mouth daily.   carbidopa-levodopa 25-100 MG tablet Commonly known as: SINEMET IR Take 2 tablets by mouth 3 (three) times daily. for parkinson's disease   escitalopram 20 MG tablet Commonly known as: LEXAPRO Take 20 mg by mouth daily.   gabapentin 100 MG capsule Commonly known as: NEURONTIN Take 100 mg by mouth 2 (two) times daily.   LORazepam 1 MG tablet Commonly known as: ATIVAN Take 1 mg by mouth every 4 (four) hours as needed for anxiety.   ondansetron 4 MG tablet Commonly known as: ZOFRAN Take 1 tablet (4 mg total) by mouth every 6 (six) hours as needed for nausea.   polyethylene glycol 17 g packet Commonly known as: MIRALAX / GLYCOLAX Take 17 g by mouth 2 (two) times daily. What changed: when to take this   QUEtiapine 25 MG tablet Commonly known as: SEROQUEL Take 25 mg by mouth every 6 (six) hours as needed (agitation).   senna-docusate 8.6-50 MG tablet Commonly known as: Senokot-S Take 1 tablet by mouth 2 (two) times daily.   Synthroid 88 MCG tablet Generic drug: levothyroxine Take 88 mcg by mouth daily at 6 (six) AM.   traMADol 50 MG tablet Commonly known as: ULTRAM Take 50 mg by mouth every 6 (six) hours as needed for moderate pain.   VITAMIN B-2 PO Take 1,000 mcg by mouth daily.      Follow-up Information    Ferd Hibbs, NP. Schedule an appointment as soon as possible for a visit in 1 week(s).   Specialty: Nurse Practitioner Contact information: [place] [address] [phone]        [facility] Follow up.   Specialty: PALLIATIVE CARE Why: At earliest convenience Contact information: [place]  [address] [phone]         Allergies  Allergen Reactions  . Relafen [Nabumetone] Diarrhea and Other (See Comments)    GI / Urinary Bleeding  . Lipitor [Atorvastatin] Other (See Comments)    Causes memory loss  . Other Other (See Comments)    "orvail" unknown:  Causes Bloody stool  . Penicillins Hives, Itching, Swelling and Rash    Has patient had a PCN reaction causing immediate rash, facial/tongue/throat swelling, SOB or lightheadedness with hypotension: no Has patient had a PCN reaction causing severe rash involving mucus membranes or skin necrosis: unknown Has patient had a PCN reaction that required hospitalization no Has patient had a PCN reaction occurring within the last 10 years: no If all of the above answers are "NO", then may proceed with Cephalosporin use.     Consultations:  Palliative care evaluation is pending.   Procedures/Studies: CT Head Wo Contrast  Result Date: 10/20/2019 CLINICAL DATA:  Anorexia for 4 days, altered level of consciousness EXAM: CT HEAD WITHOUT CONTRAST TECHNIQUE: Contiguous axial images were obtained from the base of the skull through the vertex without intravenous contrast. COMPARISON:  02/26/2018 FINDINGS: Brain: No acute infarct or hemorrhage. Chronic small vessel ischemic changes are seen within the periventricular white matter.  Lateral ventricles and midline structures appear unremarkable. No acute extra-axial fluid collections. No mass effect. Vascular: No hyperdense vessel or unexpected calcification. Skull: Normal. Negative for fracture or focal lesion. Sinuses/Orbits: No acute finding. Other: None IMPRESSION: 1. Stable head CT, no acute process. Electronically Signed   By: Randa Ngo M.D.   On: 10/20/2019 14:34   CT Abdomen Pelvis W Contrast  Result Date: 10/20/2019 CLINICAL DATA:  Altered mental status, vomiting, decreased appetite EXAM: CT ABDOMEN AND PELVIS WITH CONTRAST TECHNIQUE: Multidetector CT imaging of the abdomen and  pelvis was performed using the standard protocol following bolus administration of intravenous contrast. CONTRAST:  32mL OMNIPAQUE IOHEXOL 300 MG/ML  SOLN COMPARISON:  10/17/2015 FINDINGS: Lower chest: Small to moderate-sized hiatal hernia. There is asymmetric soft tissue thickening along the right lateral aspect the level of the GE junction, poorly characterized on the cranial most axial image (series 2, image 1). Mild bibasilar scarring. Heart size normal. Hepatobiliary: Liver has an unremarkable appearance without focal hepatic lesion. There is a very small amount of layering hyperdense material within the gallbladder lumen. No biliary dilatation. Pancreas: Unremarkable. No pancreatic ductal dilatation or surrounding inflammatory changes. Spleen: Normal in size without focal abnormality. Adrenals/Urinary Tract: Unremarkable adrenal glands. Mild bilateral renal cortical atrophy. Tiny cyst within the inferior pole the right kidney. Kidneys are otherwise unremarkable. No stone or hydronephrosis. Urinary bladder is anteriorly displaced by large rectal stool ball, but appears otherwise unremarkable. Stomach/Bowel: Rectum is distended with a prominent stool ball measuring 7.8 x 6.2 cm transaxially. Moderate volume of stool throughout the remainder of the colon. No focal bowel wall thickening or inflammatory changes are seen. Appendix was not definitively visualized. No bowel distension to suggest obstruction. Vascular/Lymphatic: Aortic atherosclerosis without aneurysm. No abdominopelvic lymphadenopathy. Reproductive: Status post hysterectomy. No adnexal masses. Other: No abdominal wall hernia or abnormality. No abdominopelvic ascites. Musculoskeletal: 8 mm anterolisthesis L4 on L5. Degenerative disc disease of the lower lumbar spine. No acute osseous findings. IMPRESSION: 1. Rectum is distended with a large stool ball measuring 7.8 x 6.2 cm transaxially. Moderate volume of stool throughout the remainder of the colon  suggesting constipation. 2. Small-to-moderate hiatal hernia with asymmetric soft tissue thickening at the GE junction, poorly characterized on the cranial-most axial image. Consider direct visualization. 3. Cholelithiasis Aortic Atherosclerosis (ICD10-I70.0). Electronically Signed   By: Davina Poke D.O.   On: 10/20/2019 14:45   DG Chest Port 1 View  Result Date: 10/20/2019 CLINICAL DATA:  Anorexia for 3-4 days, vomiting, Alzheimer's EXAM: PORTABLE CHEST 1 VIEW COMPARISON:  03/24/2019 FINDINGS: Single frontal view of the chest demonstrates an unremarkable cardiac silhouette. No airspace disease, effusion, or pneumothorax. Chronic interstitial prominence. No acute bony abnormalities. IMPRESSION: 1. Chronic interstitial lung disease.  No acute process. Electronically Signed   By: Randa Ngo M.D.   On: 10/20/2019 13:04       Subjective: Patient seen and examined at bedside.  She is sleepy, wakes up only very slightly.  Answers any questions.  Sitter at bedside states that her oral and her mental status is much improved.  No overnight fever or vomiting reported.  She had bowel movements yesterday as per the bedside sitter.  Discharge Exam: Vitals:   10/22/19 2106 10/23/19 0543  BP: (!) 109/50 (!) 118/57  Pulse: 64 62  Resp: 15 16  Temp: (!) 97.3 F (36.3 C) (!) 97.5 F (36.4 C)  SpO2: 97% 97%    General: Elderly female lying in bed.  No acute distress.  Sleepy, wakes  up only very slightly, hardly answers any questions.  Looks chronically ill.   Cardiovascular: rate controlled, S1/S2 + Respiratory: bilateral decreased breath sounds at bases Abdominal: Soft, NT, ND, bowel sounds + Extremities: no edema, no cyanosis    The results of significant diagnostics from this hospitalization (including imaging, microbiology, ancillary and laboratory) are listed below for reference.     Microbiology: Recent Results (from the past 240 hour(s))  Urine culture     Status: Abnormal    Collection Time: 10/20/19 12:46 PM   Specimen: Urine, Random  Result Value Ref Range Status   Specimen Description   Final    URINE, RANDOM Performed at [facility] [address] [place], [place] [address]    Special Requests   Final    NONE Performed at [facility], [place] [address], [place], [place] [address]    Culture MULTIPLE SPECIES PRESENT, SUGGEST RECOLLECTION (A)  Final   Report Status 10/21/2019 FINAL  Final  SARS CORONAVIRUS 2 (TAT 6-24 HRS) Nasopharyngeal Nasopharyngeal Swab     Status: None   Collection Time: 10/20/19  2:28 PM   Specimen: Nasopharyngeal Swab  Result Value Ref Range Status   SARS Coronavirus 2 NEGATIVE NEGATIVE Final    Comment: (NOTE) SARS-CoV-2 target nucleic acids are NOT DETECTED. The SARS-CoV-2 RNA is generally detectable in upper and lower respiratory specimens during the acute phase of infection. Negative results do not preclude SARS-CoV-2 infection, do not rule out co-infections with other pathogens, and should not be used as the sole basis for treatment or other patient management decisions. Negative results must be combined with clinical observations, patient history, and epidemiological information. The expected result is Negative. Fact Sheet for Patients: SugarRoll.be Fact Sheet for Healthcare Providers: https://www.woods-mathews.com/ This test is not yet approved or cleared by the [place] FDA and  has been authorized for detection and/or diagnosis of SARS-CoV-2 by FDA under an Emergency Use Authorization (EUA). This EUA will remain  in effect (meaning this test can be used) for the duration of the COVID-19 declaration under Section 56 4(b)(1) of the Act, 21 U.S.C. section 360bbb-3(b)(1), unless the authorization is terminated or revoked sooner. Performed at [facility], [place] [address], [place], [place] [address]      Labs: BNP (last 3  results) No results for input(s): BNP in the last 8760 hours. Basic Metabolic Panel: Recent Labs  Lab 10/20/19 1246 10/21/19 0551 10/22/19 0514 10/23/19 0555  NA 147* 146* 138 137  K 4.2 5.1 3.4* 3.3*  CL 109 114* 107 106  CO2 24 24 24 25   GLUCOSE 107* 91 106* 98  BUN 63* 42* 25* 15  CREATININE 1.46* 1.11* 0.96 0.96  CALCIUM 9.8 9.2 8.3* 8.5*  MG  --   --  1.9 2.1   Liver Function Tests: Recent Labs  Lab 10/20/19 1246 10/22/19 0514  AST 15 12*  ALT 6 5  ALKPHOS 83 53  BILITOT 0.8 0.6  PROT 8.1 5.6*  ALBUMIN 4.2 2.9*   No results for input(s): LIPASE, AMYLASE in the last 168 hours. No results for input(s): AMMONIA in the last 168 hours. CBC: Recent Labs  Lab 10/20/19 1246 10/21/19 0551 10/22/19 0514  WBC 9.5 7.7 4.6  NEUTROABS 7.1  --  2.6  HGB 13.2 11.0* 9.9*  HCT 39.9 34.2* 30.4*  MCV 98.5 99.4 98.4  PLT 254 202 176   Cardiac Enzymes: No results for input(s): CKTOTAL, CKMB, CKMBINDEX, TROPONINI in the last 168  hours. BNP: Invalid input(s): POCBNP CBG: No results for input(s): GLUCAP in the last 168 hours. D-Dimer No results for input(s): DDIMER in the last 72 hours. Hgb A1c No results for input(s): HGBA1C in the last 72 hours. Lipid Profile No results for input(s): CHOL, HDL, LDLCALC, TRIG, CHOLHDL, LDLDIRECT in the last 72 hours. Thyroid function studies No results for input(s): TSH, T4TOTAL, T3FREE, THYROIDAB in the last 72 hours.  Invalid input(s): FREET3 Anemia work up No results for input(s): VITAMINB12, FOLATE, FERRITIN, TIBC, IRON, RETICCTPCT in the last 72 hours. Urinalysis    Component Value Date/Time   COLORURINE YELLOW 10/20/2019 1246   APPEARANCEUR CLOUDY (A) 10/20/2019 1246   LABSPEC 1.025 10/20/2019 1246   PHURINE 5.0 10/20/2019 1246   GLUCOSEU NEGATIVE 10/20/2019 1246   GLUCOSEU NEGATIVE 07/05/2015 1724   HGBUR MODERATE (A) 10/20/2019 1246   BILIRUBINUR NEGATIVE 10/20/2019 1246   KETONESUR 5 (A) 10/20/2019 1246   PROTEINUR  100 (A) 10/20/2019 1246   UROBILINOGEN 0.2 07/05/2015 1724   NITRITE NEGATIVE 10/20/2019 1246   LEUKOCYTESUR LARGE (A) 10/20/2019 1246   Sepsis Labs Invalid input(s): PROCALCITONIN,  WBC,  LACTICIDVEN Microbiology Recent Results (from the past 240 hour(s))  Urine culture     Status: Abnormal   Collection Time: 10/20/19 12:46 PM   Specimen: Urine, Random  Result Value Ref Range Status   Specimen Description   Final    URINE, RANDOM Performed at [facility] [address] [place], [place] [address]    Special Requests   Final    NONE Performed at [facility], [place] [address], [place], [place] [address]    Culture MULTIPLE SPECIES PRESENT, SUGGEST RECOLLECTION (A)  Final   Report Status 10/21/2019 FINAL  Final  SARS CORONAVIRUS 2 (TAT 6-24 HRS) Nasopharyngeal Nasopharyngeal Swab     Status: None   Collection Time: 10/20/19  2:28 PM   Specimen: Nasopharyngeal Swab  Result Value Ref Range Status   SARS Coronavirus 2 NEGATIVE NEGATIVE Final    Comment: (NOTE) SARS-CoV-2 target nucleic acids are NOT DETECTED. The SARS-CoV-2 RNA is generally detectable in upper and lower respiratory specimens during the acute phase of infection. Negative results do not preclude SARS-CoV-2 infection, do not rule out co-infections with other pathogens, and should not be used as the sole basis for treatment or other patient management decisions. Negative results must be combined with clinical observations, patient history, and epidemiological information. The expected result is Negative. Fact Sheet for Patients: SugarRoll.be Fact Sheet for Healthcare Providers: https://www.woods-mathews.com/ This test is not yet approved or cleared by the [place] FDA and  has been authorized for detection and/or diagnosis of SARS-CoV-2 by FDA under an Emergency Use Authorization (EUA). This EUA will remain  in effect (meaning this  test can be used) for the duration of the COVID-19 declaration under Section 56 4(b)(1) of the Act, 21 U.S.C. section 360bbb-3(b)(1), unless the authorization is terminated or revoked sooner. Performed at [facility], [place] [address] [place], [place] [address]      Time coordinating discharge: 35 minutes  SIGNED:   Aline August, MD  [facility] 10/23/2019, 9:18 AM

## 2019-10-23 NOTE — Telephone Encounter (Signed)
[facility] Palliative visit scheduled for 10-27-19 at 3:30.

## 2019-10-27 ENCOUNTER — Encounter: Payer: Self-pay | Admitting: Internal Medicine

## 2019-10-27 ENCOUNTER — Other Ambulatory Visit: Payer: Medicare Other | Admitting: Internal Medicine

## 2019-10-27 ENCOUNTER — Other Ambulatory Visit: Payer: Self-pay

## 2019-10-27 DIAGNOSIS — Z515 Encounter for palliative care: Secondary | ICD-10-CM

## 2019-10-27 DIAGNOSIS — Z7189 Other specified counseling: Secondary | ICD-10-CM

## 2019-10-27 NOTE — Progress Notes (Signed)
March 30th, 2021 [facility] Palliative Care Consult Note Telephone: [phone]  Fax: [phone]  PATIENT NAME: [name] DOB: 1935/03/01 MRN: [id number] [address] A-1C ([phone]  PRIMARY CARE PROVIDER:   Ferd Hibbs, NP/Dr. Melodie Bouillon PROVIDER:  Ferd Hibbs, NP [place],  [place] [address]  Doctors at hospital recommended eval   RESPONSIBLE PARTY: *(dtr) [name] [phone]. (son)[name] [phone], (son) [name] [phone]   ASSESSMENT / RECOMMENDATIONS:  1. Advance Care Planning: A. Directives: discussed with daughter; verified wish for DNR; form available in [place]. Sections of MOST form reviewed; form signed, uploaded to [place], and original left in the home: DNR/DNI. Limited scope of medical interventions. Yes to Antibiotics, and IVFs. Tube feeding to be determined at the time of need, would consider if patient cognitively intact. B. Goals of Care:  that patient is comfortable. Wish to keep patient engaged, so that she doesn't "give up".   2. Cognitive / Functional status: Per daughter and hired care givers, patient appears cognitively interactive, but progressively over the last 6 months her speech has gone from very soft voice that was difficult to hear, to garbled speech. She is now only able to utter a single word here and there. Caregivers relate she waxes and wanes in attentiveness, but for me she is somnolent and lethargic, with difficulty maintaining eye contact. She did not respond to my requests or questions.  She is dependent for transfers (1 person). Progressive weakness so that her legs go out from under her; can only stand momentarily. She is wheelchair bound.  Last fall about 1 month ago when attempting to get up on her own. Patient surprised family when she was seen ambulating with walker when in hospital. Needs assist to dress, hygiene, bathing. She is  incontinent of bowel and bladder.  Care givers report patient can initially eat with a spoon, but then they have to help her. When she was recently in the hospital, speech therapy recommended a pureed diet, but patient refused. Family cuts up food finely and moisten. Some problems with coughing and choking on food. They use "Nectar thick" for liquids and she does well with this if she takes her time. She consumes 2 protein shakes a day, but otherwise just bites of food (form 50% about 2 weeks ago).Her weight is unknown but daughter guesses she weighs about 100lbs. At 5'7" her BMI is is 15.7 kg/m2.  She has O2 @ 2LPM [place]. Sats run 95%.   3. Family Supports: Daughter [name] rented apt for her mom and dad (patient's former home not safe; only a fireplace for heat; not wheelchair accessible) where patient /spouse have lived for 2 years. Spouse with early onset dementia/combative. Both patient and her spouse has 24/7 caregivers.  [facility] (private agency run by [name]). Daughter is acting guardian for her dad; pays for caregiving expenses out of her dad's holdings. Patient has 2 sons; younger son [name] is more available to assist, but Son [name] will assist if called.   4. Follow up Palliative Care Visit: Daughter [name] has palliative care NP [name]'s contact info and will call her if they note patient with changes or signs of decline. We discussed hospice, and that patient is eligible for services. [name] defers for now; she feels she has adequate support currently in place.   I spent 60 minutes providing this consultation from 3:30pm to 4:40pm. More than 50%  of the time in this consultation was spent coordinating communication.   HISTORY OF PRESENT ILLNESS:  [name] is an 85 y.o. female with Parkinson's disease, dementia (mostly bedbound at home), stroke (partial blindness L eye), emphysema,   3/23-3/26/2021 Hospitalized with vomiting/poor oral intake. Lg stool ball in rectum, AKI,  hyponatremia, treated for UTI. Palliative Care was asked to help address goals of care.   CODE STATUS: DNR  PPS: 20%-30%  HOSPICE ELIGIBILITY/DIAGNOSIS: yes/failure to thrive, protein calorie malnutrition  PAST MEDICAL HISTORY:  Past Medical History:  Diagnosis Date  . Acute cystitis   . Anemia   . Anxiety   . Aortic stenosis, mild 11/18/2013  . Asthma   . Back pain   . Bronchitis, acute   . Carotid artery occlusion   . Cerebrovascular disease   . COPD (chronic obstructive pulmonary disease) ([place])   . Dementia ([place])   . Depression   . Diverticulosis of colon   . DJD (degenerative joint disease)   . Fall 07/05/2015  . GERD (gastroesophageal reflux disease)   . History of sudden visual loss   . Hyperlipidemia   . Hypertension   . Hypothyroidism   . Parkinson's disease ([place])   . Pneumonia 2015   hx of x 2   . Pulmonary hypertension, moderate to severe ([place]) 11/19/2014  . Shortness of breath    with exertion   . Stroke [facility])    2010 partial blind in left eye   . Transient ischemic attack   . UTI (lower urinary tract infection)    hx of     SOCIAL HX:  Social History   Tobacco Use  . Smoking status: Never Smoker  . Smokeless tobacco: Never Used  Substance Use Topics  . Alcohol use: No    ALLERGIES:  Allergies  Allergen Reactions  . Relafen [Nabumetone] Diarrhea and Other (See Comments)    GI / Urinary Bleeding  . Lipitor [Atorvastatin] Other (See Comments)    Causes memory loss  . Other Other (See Comments)    "orvail" unknown:  Causes Bloody stool  . Penicillins Hives, Itching, Swelling and Rash    Has patient had a PCN reaction causing immediate rash, facial/tongue/throat swelling, SOB or lightheadedness with hypotension: no Has patient had a PCN reaction causing severe rash involving mucus membranes or skin necrosis: unknown Has patient had a PCN reaction that required hospitalization no Has patient had a PCN reaction occurring within the last 10 years:  no If all of the above answers are "NO", then may proceed with Cephalosporin use.      PERTINENT MEDICATIONS:  Outpatient Encounter Medications as of 10/27/2019  Medication Sig  . ADVAIR HFA 230-21 MCG/ACT inhaler Inhale 2 puffs into the lungs 2 (two) times daily.  Marland Kitchen aspirin EC 81 MG tablet Take 81 mg by mouth daily.  . carbidopa-levodopa (SINEMET IR) 25-100 MG tablet Take 2 tablets by mouth 3 (three) times daily. for parkinson's disease  . escitalopram (LEXAPRO) 20 MG tablet Take 20 mg by mouth daily.   Marland Kitchen gabapentin (NEURONTIN) 100 MG capsule Take 100 mg by mouth 2 (two) times daily.  Marland Kitchen levothyroxine (SYNTHROID) 88 MCG tablet Take 88 mcg by mouth daily at 6 (six) AM.   . LORazepam (ATIVAN) 1 MG tablet Take 1 mg by mouth every 4 (four) hours as needed for anxiety.   . ondansetron (ZOFRAN) 4 MG tablet Take 1 tablet (4 mg total) by mouth every 6 (six) hours as needed for nausea.  Marland Kitchen  polyethylene glycol (MIRALAX / GLYCOLAX) 17 g packet Take 17 g by mouth 2 (two) times daily.  . QUEtiapine (SEROQUEL) 25 MG tablet Take 25 mg by mouth every 6 (six) hours as needed (agitation).  . Riboflavin (VITAMIN B-2 PO) Take 1,000 mcg by mouth daily.   Marland Kitchen senna-docusate (SENOKOT-S) 8.6-50 MG tablet Take 1 tablet by mouth 2 (two) times daily.  . traMADol (ULTRAM) 50 MG tablet Take 50 mg by mouth every 6 (six) hours as needed for moderate pain.   No facility-administered encounter medications on file as of 10/27/2019.    PHYSICAL EXAM:   General: Frail appearing, cachectic, elderly women lying supine and asleep in hospital bed. She slowly awoke, would maintain only transient eye contact. She appeared lethargic. PE abbreviated to minimize COVID exposure.  Extremities: no edema, no joint deformities. Muscular/adipose wasting. Skin: no rashes Neurological: Weakness but otherwise non-focal  Julianne Handler, NP

## 2019-11-24 ENCOUNTER — Encounter (HOSPITAL_COMMUNITY): Payer: Self-pay | Admitting: *Deleted

## 2019-11-24 ENCOUNTER — Inpatient Hospital Stay (HOSPITAL_COMMUNITY)
Admission: EM | Admit: 2019-11-24 | Discharge: 2019-11-28 | DRG: 377 | Disposition: E | Payer: Medicare Other | Attending: Pulmonary Disease | Admitting: Pulmonary Disease

## 2019-11-24 ENCOUNTER — Other Ambulatory Visit: Payer: Self-pay

## 2019-11-24 DIAGNOSIS — F329 Major depressive disorder, single episode, unspecified: Secondary | ICD-10-CM | POA: Diagnosis present

## 2019-11-24 DIAGNOSIS — Z6824 Body mass index (BMI) 24.0-24.9, adult: Secondary | ICD-10-CM

## 2019-11-24 DIAGNOSIS — Z8249 Family history of ischemic heart disease and other diseases of the circulatory system: Secondary | ICD-10-CM | POA: Diagnosis not present

## 2019-11-24 DIAGNOSIS — Z7951 Long term (current) use of inhaled steroids: Secondary | ICD-10-CM

## 2019-11-24 DIAGNOSIS — R64 Cachexia: Secondary | ICD-10-CM

## 2019-11-24 DIAGNOSIS — Z880 Allergy status to penicillin: Secondary | ICD-10-CM

## 2019-11-24 DIAGNOSIS — Z83438 Family history of other disorder of lipoprotein metabolism and other lipidemia: Secondary | ICD-10-CM | POA: Diagnosis not present

## 2019-11-24 DIAGNOSIS — I10 Essential (primary) hypertension: Secondary | ICD-10-CM | POA: Diagnosis present

## 2019-11-24 DIAGNOSIS — E039 Hypothyroidism, unspecified: Secondary | ICD-10-CM | POA: Diagnosis present

## 2019-11-24 DIAGNOSIS — Z8673 Personal history of transient ischemic attack (TIA), and cerebral infarction without residual deficits: Secondary | ICD-10-CM | POA: Diagnosis not present

## 2019-11-24 DIAGNOSIS — K5731 Diverticulosis of large intestine without perforation or abscess with bleeding: Secondary | ICD-10-CM | POA: Diagnosis present

## 2019-11-24 DIAGNOSIS — R54 Age-related physical debility: Secondary | ICD-10-CM | POA: Diagnosis present

## 2019-11-24 DIAGNOSIS — Z96652 Presence of left artificial knee joint: Secondary | ICD-10-CM | POA: Diagnosis present

## 2019-11-24 DIAGNOSIS — K625 Hemorrhage of anus and rectum: Secondary | ICD-10-CM | POA: Diagnosis present

## 2019-11-24 DIAGNOSIS — Z515 Encounter for palliative care: Secondary | ICD-10-CM | POA: Diagnosis present

## 2019-11-24 DIAGNOSIS — Z993 Dependence on wheelchair: Secondary | ICD-10-CM

## 2019-11-24 DIAGNOSIS — D62 Acute posthemorrhagic anemia: Secondary | ICD-10-CM | POA: Diagnosis present

## 2019-11-24 DIAGNOSIS — N179 Acute kidney failure, unspecified: Secondary | ICD-10-CM

## 2019-11-24 DIAGNOSIS — F0280 Dementia in other diseases classified elsewhere without behavioral disturbance: Secondary | ICD-10-CM | POA: Diagnosis present

## 2019-11-24 DIAGNOSIS — E872 Acidosis: Secondary | ICD-10-CM | POA: Diagnosis not present

## 2019-11-24 DIAGNOSIS — H547 Unspecified visual loss: Secondary | ICD-10-CM | POA: Diagnosis present

## 2019-11-24 DIAGNOSIS — K5730 Diverticulosis of large intestine without perforation or abscess without bleeding: Principal | ICD-10-CM | POA: Diagnosis present

## 2019-11-24 DIAGNOSIS — E860 Dehydration: Secondary | ICD-10-CM | POA: Diagnosis present

## 2019-11-24 DIAGNOSIS — K922 Gastrointestinal hemorrhage, unspecified: Secondary | ICD-10-CM | POA: Diagnosis present

## 2019-11-24 DIAGNOSIS — F419 Anxiety disorder, unspecified: Secondary | ICD-10-CM | POA: Diagnosis present

## 2019-11-24 DIAGNOSIS — N170 Acute kidney failure with tubular necrosis: Secondary | ICD-10-CM | POA: Diagnosis present

## 2019-11-24 DIAGNOSIS — E878 Other disorders of electrolyte and fluid balance, not elsewhere classified: Secondary | ICD-10-CM | POA: Diagnosis not present

## 2019-11-24 DIAGNOSIS — Z825 Family history of asthma and other chronic lower respiratory diseases: Secondary | ICD-10-CM | POA: Diagnosis not present

## 2019-11-24 DIAGNOSIS — K219 Gastro-esophageal reflux disease without esophagitis: Secondary | ICD-10-CM | POA: Diagnosis present

## 2019-11-24 DIAGNOSIS — Z9049 Acquired absence of other specified parts of digestive tract: Secondary | ICD-10-CM | POA: Diagnosis not present

## 2019-11-24 DIAGNOSIS — Z888 Allergy status to other drugs, medicaments and biological substances status: Secondary | ICD-10-CM

## 2019-11-24 DIAGNOSIS — I129 Hypertensive chronic kidney disease with stage 1 through stage 4 chronic kidney disease, or unspecified chronic kidney disease: Secondary | ICD-10-CM | POA: Diagnosis present

## 2019-11-24 DIAGNOSIS — F0281 Dementia in other diseases classified elsewhere with behavioral disturbance: Secondary | ICD-10-CM | POA: Diagnosis not present

## 2019-11-24 DIAGNOSIS — E785 Hyperlipidemia, unspecified: Secondary | ICD-10-CM | POA: Diagnosis present

## 2019-11-24 DIAGNOSIS — Z66 Do not resuscitate: Secondary | ICD-10-CM

## 2019-11-24 DIAGNOSIS — R000 Tachycardia, unspecified: Secondary | ICD-10-CM | POA: Diagnosis present

## 2019-11-24 DIAGNOSIS — E870 Hyperosmolality and hypernatremia: Secondary | ICD-10-CM | POA: Diagnosis present

## 2019-11-24 DIAGNOSIS — G20 Parkinson's disease: Secondary | ICD-10-CM | POA: Diagnosis present

## 2019-11-24 DIAGNOSIS — J449 Chronic obstructive pulmonary disease, unspecified: Secondary | ICD-10-CM | POA: Diagnosis present

## 2019-11-24 DIAGNOSIS — I9589 Other hypotension: Secondary | ICD-10-CM | POA: Diagnosis present

## 2019-11-24 DIAGNOSIS — R578 Other shock: Secondary | ICD-10-CM

## 2019-11-24 DIAGNOSIS — Z79899 Other long term (current) drug therapy: Secondary | ICD-10-CM

## 2019-11-24 DIAGNOSIS — R1310 Dysphagia, unspecified: Secondary | ICD-10-CM | POA: Diagnosis present

## 2019-11-24 DIAGNOSIS — I350 Nonrheumatic aortic (valve) stenosis: Secondary | ICD-10-CM | POA: Diagnosis present

## 2019-11-24 DIAGNOSIS — R296 Repeated falls: Secondary | ICD-10-CM | POA: Diagnosis present

## 2019-11-24 DIAGNOSIS — Z20822 Contact with and (suspected) exposure to covid-19: Secondary | ICD-10-CM | POA: Diagnosis present

## 2019-11-24 DIAGNOSIS — E861 Hypovolemia: Secondary | ICD-10-CM | POA: Diagnosis present

## 2019-11-24 DIAGNOSIS — Z79890 Hormone replacement therapy (postmenopausal): Secondary | ICD-10-CM

## 2019-11-24 DIAGNOSIS — N1831 Chronic kidney disease, stage 3a: Secondary | ICD-10-CM | POA: Diagnosis present

## 2019-11-24 DIAGNOSIS — R627 Adult failure to thrive: Secondary | ICD-10-CM | POA: Diagnosis present

## 2019-11-24 DIAGNOSIS — Z7982 Long term (current) use of aspirin: Secondary | ICD-10-CM

## 2019-11-24 DIAGNOSIS — I272 Pulmonary hypertension, unspecified: Secondary | ICD-10-CM | POA: Diagnosis present

## 2019-11-24 DIAGNOSIS — K5900 Constipation, unspecified: Secondary | ICD-10-CM | POA: Diagnosis present

## 2019-11-24 LAB — COMPREHENSIVE METABOLIC PANEL
ALT: <5 U/L (ref 0–44)
AST: 16 U/L (ref 15–41)
Albumin: 2.5 g/dL — ABNORMAL LOW (ref 3.5–5.0)
Alkaline Phosphatase: 70 U/L (ref 38–126)
Anion gap: 12 (ref 5–15)
BUN: 104 mg/dL — ABNORMAL HIGH (ref 8–23)
CO2: 21 mmol/L — ABNORMAL LOW (ref 22–32)
Calcium: 8.6 mg/dL — ABNORMAL LOW (ref 8.9–10.3)
Chloride: 120 mmol/L — ABNORMAL HIGH (ref 98–111)
Creatinine, Ser: 2.54 mg/dL — ABNORMAL HIGH (ref 0.44–1.00)
GFR calc Af Amer: 20 mL/min — ABNORMAL LOW (ref >60)
GFR calc non Af Amer: 17 mL/min — ABNORMAL LOW (ref >60)
Glucose, Bld: 138 mg/dL — ABNORMAL HIGH (ref 70–99)
Potassium: 4.8 mmol/L (ref 3.5–5.1)
Sodium: 153 mmol/L — ABNORMAL HIGH (ref 135–145)
Total Bilirubin: 0.7 mg/dL (ref 0.3–1.2)
Total Protein: 7 g/dL (ref 6.5–8.1)

## 2019-11-24 LAB — CBC WITH DIFFERENTIAL/PLATELET
Abs Immature Granulocytes: 0.08 10*3/uL — ABNORMAL HIGH (ref 0.00–0.07)
Basophils Absolute: 0 10*3/uL (ref 0.0–0.1)
Basophils Relative: 0 %
Eosinophils Absolute: 0 10*3/uL (ref 0.0–0.5)
Eosinophils Relative: 0 %
HCT: 29.2 % — ABNORMAL LOW (ref 36.0–46.0)
Hemoglobin: 9 g/dL — ABNORMAL LOW (ref 12.0–15.0)
Immature Granulocytes: 1 %
Lymphocytes Relative: 12 %
Lymphs Abs: 1.5 10*3/uL (ref 0.7–4.0)
MCH: 32.7 pg (ref 26.0–34.0)
MCHC: 30.8 g/dL (ref 30.0–36.0)
MCV: 106.2 fL — ABNORMAL HIGH (ref 80.0–100.0)
Monocytes Absolute: 0.5 10*3/uL (ref 0.1–1.0)
Monocytes Relative: 4 %
Neutro Abs: 10.2 10*3/uL — ABNORMAL HIGH (ref 1.7–7.7)
Neutrophils Relative %: 83 %
Platelets: 384 10*3/uL (ref 150–400)
RBC: 2.75 MIL/uL — ABNORMAL LOW (ref 3.87–5.11)
RDW: 14.9 % (ref 11.5–15.5)
WBC: 12.4 10*3/uL — ABNORMAL HIGH (ref 4.0–10.5)
nRBC: 0 % (ref 0.0–0.2)

## 2019-11-24 LAB — URINALYSIS, ROUTINE W REFLEX MICROSCOPIC
Bilirubin Urine: NEGATIVE
Glucose, UA: NEGATIVE mg/dL
Ketones, ur: 5 mg/dL — AB
Nitrite: NEGATIVE
Protein, ur: 100 mg/dL — AB
Specific Gravity, Urine: 1.023 (ref 1.005–1.030)
WBC, UA: >50 WBC/hpf — ABNORMAL HIGH (ref 0–5)
pH: 5 (ref 5.0–8.0)

## 2019-11-24 LAB — PROTIME-INR
INR: 1.2 (ref 0.8–1.2)
Prothrombin Time: 14.7 seconds (ref 11.4–15.2)

## 2019-11-24 LAB — PREPARE RBC (CROSSMATCH)

## 2019-11-24 LAB — LACTIC ACID, PLASMA: Lactic Acid, Venous: 3 mmol/L (ref 0.5–1.9)

## 2019-11-24 LAB — RESPIRATORY PANEL BY RT PCR (FLU A&B, COVID)
Influenza A by PCR: NEGATIVE
Influenza B by PCR: NEGATIVE
SARS Coronavirus 2 by RT PCR: NEGATIVE

## 2019-11-24 MED ORDER — SODIUM CHLORIDE 0.9 % IV SOLN: INTRAVENOUS | Status: DC

## 2019-11-24 MED ORDER — SODIUM CHLORIDE 0.9 % IV SOLN
1.0000 g | Freq: Once | INTRAVENOUS | Status: AC
  Administered 2019-11-24: 22:00:00 1 g via INTRAVENOUS
  Filled 2019-11-24: qty 10

## 2019-11-24 MED ORDER — SODIUM CHLORIDE 0.9 % IV BOLUS
1000.0000 mL | Freq: Once | INTRAVENOUS | Status: AC
  Administered 2019-11-24: 20:00:00 1000 mL via INTRAVENOUS

## 2019-11-24 MED ORDER — ORAL CARE MOUTH RINSE: 15.0000 mL | Freq: Two times a day (BID) | OROMUCOSAL | Status: DC

## 2019-11-24 MED ORDER — LEVOTHYROXINE SODIUM 100 MCG/5ML IV SOLN
44.0000 ug | Freq: Every day | INTRAVENOUS | Status: DC
  Filled 2019-11-24: qty 5

## 2019-11-24 MED ORDER — SODIUM CHLORIDE 0.9 % IV BOLUS
1000.0000 mL | Freq: Once | INTRAVENOUS | Status: AC
  Administered 2019-11-24: 1000 mL via INTRAVENOUS

## 2019-11-24 MED ORDER — SODIUM CHLORIDE 0.9 % IV SOLN: 10.0000 mL/h | Freq: Once | INTRAVENOUS | Status: DC

## 2019-11-24 MED ORDER — CHLORHEXIDINE GLUCONATE CLOTH 2 % EX PADS
6.0000 | MEDICATED_PAD | Freq: Every day | CUTANEOUS | Status: DC
  Administered 2019-11-25: 10:00:00 6 via TOPICAL

## 2019-11-24 MED ORDER — PANTOPRAZOLE SODIUM 40 MG IV SOLR
40.0000 mg | INTRAVENOUS | Status: DC
  Administered 2019-11-25 (×2): 40 mg via INTRAVENOUS
  Filled 2019-11-24 (×2): qty 40

## 2019-11-24 NOTE — ED Notes (Signed)
Pt continues to have active bleeding from her rectum. Dr.Butler aware. First unit of blood infusing

## 2019-11-24 NOTE — ED Notes (Signed)
Unable to obtain SPO2 will try other sites.

## 2019-11-24 NOTE — ED Notes (Signed)
Admitting provider at bedside.

## 2019-11-24 NOTE — ED Triage Notes (Signed)
Pt from home with 24 hour care, called out for sob. EMS reported pt was hypotensive in the 80s, received 425ml NS with improvement of BP. Hx of UTI with recent dx. On arrival, pt alert, blood pressure in the 60s. Pt noted to have her hand under her butt and had bright red blood. Active bleeding noted from rectal with stool and blood clots. Aide reports pt had been placing her hand in her brief. Also has had frequent falls at home, hematoma to L side of her head. Dr.Butler at bedside on arrival. DNR/MOST at bedsdie

## 2019-11-24 NOTE — ED Provider Notes (Signed)
[facility] EMERGENCY DEPARTMENT Provider Note   CSN: [id number] Arrival date & time: 11/07/2019  1913     History Chief Complaint  Patient presents with  . Rectal Bleeding    [name] is a 85 y.o. female.  Level 5 caveat secondary to mental status.  She lives at home with a 24/7 caregiver.  The caregiver noticed that she had been scratching or rubbing her lower abdomen yesterday.  Today she said she was less responsive.  Baseline is she usually does not speak but cannot and can move herself around the bed.  She appeared very weak.  There was concerned she might have another urinary tract infection that she gets frequently.  The history is provided by the EMS personnel, a caregiver and a relative.  Weakness Severity:  Unable to specify Onset quality:  Gradual Progression:  Worsening Chronicity:  Recurrent Context: urinary tract infection   Relieved by:  Nothing Worsened by:  Nothing Ineffective treatments:  None tried      Past Medical History:  Diagnosis Date  . Acute cystitis   . Anemia   . Anxiety   . Aortic stenosis, mild 11/18/2013  . Asthma   . Back pain   . Bronchitis, acute   . Carotid artery occlusion   . Cerebrovascular disease   . COPD (chronic obstructive pulmonary disease) ([place])   . Dementia ([place])   . Depression   . Diverticulosis of colon   . DJD (degenerative joint disease)   . Fall 07/05/2015  . GERD (gastroesophageal reflux disease)   . History of sudden visual loss   . Hyperlipidemia   . Hypertension   . Hypothyroidism   . Parkinson's disease ([place])   . Pneumonia 2015   hx of x 2   . Pulmonary hypertension, moderate to severe ([place]) 11/19/2014  . Shortness of breath    with exertion   . 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 Transient ischemic attack   . UTI (lower urinary tract infection)    hx of     Patient Active Problem List   Diagnosis Date Noted  . Intractable vomiting 10/20/2019  . Obstipation  10/20/2019  . Acute lower UTI 10/20/2019  . AKI (acute kidney injury) ([place]) 10/20/2019  . Hypernatremia 10/20/2019  . Protein-calorie malnutrition, severe 11/05/2016  . COPD (chronic obstructive pulmonary disease) ([place]) 11/04/2016  . Stroke (cerebrum) ([place]) 11/04/2016  . CKD (chronic kidney disease), stage III ([place]) 11/04/2016  . Acute encephalopathy 10/04/2016  . Dementia in Alzheimer's disease ([place]) 10/17/2015  . Encephalopathy, metabolic [id number]  . Contusion, hip 07/07/2015  . Falls frequently 07/07/2015  . Cervical radiculopathy 07/07/2015  . Anxiety and depression 07/07/2015  . Gait disturbance 07/07/2015  . Hyperglycemia 07/07/2015  . FTT (failure to thrive) in adult 07/07/2015  . Parkinson disease ([place])   . Chronic neck pain 07/05/2015  . Debility 07/05/2015  . Fatigue 01/19/2015  . Dysuria 01/19/2015  . Chronic restrictive lung disease 01/04/2015  . DOE (dyspnea on exertion) 11/19/2014  . Pulmonary hypertension, moderate to severe ([place]) 11/19/2014  . Irregular heart beats 08/12/2014  . Aftercare following surgery of the circulatory system, [place] 04/19/2014  . Pain of left lower extremity-Knee 04/19/2014  . Weakness 02/11/2014  . Loss of weight 02/11/2014  . Valvular heart disease 01/19/2014  . Pulmonary hypertension ([place]) 01/19/2014  . Fall 01/06/2014  . Aortic stenosis, mild 11/18/2013  . Preventative health care 11/18/2013  . Macrocytic anemia  11/18/2013  . Tremor 09/21/2013  . Dyspnea 05/18/2013  . Physical deconditioning 05/18/2013  . OA (osteoarthritis) of knee 11/17/2012  . Hyperlipidemia 11/04/2012  . Occlusion and stenosis of carotid artery without mention of cerebral infarction 03/06/2012  . ASTHMA 10/09/2010  . Sudden visual loss 01/10/2009  . TRANSIENT ISCHEMIC ATTACK 01/10/2009  . CEREBROVASCULAR DISEASE 11/19/2007  . BRONCHITIS, RECURRENT 11/19/2007  . DIVERTICULOSIS OF COLON 11/19/2007  . Hypothyroidism 08/15/2007  . Anxiety state 08/15/2007    . Depression 08/15/2007  . Essential hypertension 08/15/2007  . GERD 08/15/2007  . Osteoarthritis 08/15/2007  . BACK PAIN, LUMBAR 08/15/2007    Past Surgical History:  Procedure Laterality Date  . CARDIAC CATHETERIZATION  02/18/14  . CARDIOTHORACIC PROCEDURE  01/13/2009   Right   . COLECTOMY    . ENDARTERECTOMY     right carotid endartarectomy - 2010  . FOOT SURGERY     nerve cut between toes   . LEFT AND RIGHT HEART CATHETERIZATION WITH CORONARY ANGIOGRAM N/A 02/18/2014   Procedure: LEFT AND RIGHT HEART CATHETERIZATION WITH CORONARY ANGIOGRAM;  Surgeon: Larey Dresser, MD;  Location: [facility] CATH LAB;  Service: Cardiovascular;  Laterality: N/A;  . REPLACEMENT TOTAL KNEE     right and left knee  . TEE WITHOUT CARDIOVERSION N/A 02/08/2014   Procedure: TRANSESOPHAGEAL ECHOCARDIOGRAM (TEE);  Surgeon: Larey Dresser, MD;  Location: [facility];  Service: Cardiovascular;  Laterality: N/A;  . TOTAL KNEE ARTHROPLASTY Left 11/17/2012   Procedure: TOTAL KNEE ARTHROPLASTY;  Surgeon: Gearlean Alf, MD;  Location: [facility];  Service: Orthopedics;  Laterality: Left;  . TUBAL LIGATION    . VAGINAL HYSTERECTOMY       OB History   No obstetric history on file.     Family History  Problem Relation Age of Onset  . Emphysema Father   . Heart disease Mother   . Hyperlipidemia Mother   . Hypertension Mother   . Coronary artery disease Sister   . Heart disease Sister   . Hypertension Sister   . Varicose Veins Sister   . AAA (abdominal aortic aneurysm) Sister   . Rheumatic fever Brother        x2  . Heart attack Brother     Social History   Tobacco Use  . Smoking status: Never Smoker  . Smokeless tobacco: Never Used  Substance Use Topics  . Alcohol use: No  . Drug use: No    Home Medications Prior to Admission medications   Medication Sig Start Date End Date Taking? Authorizing Provider  ADVAIR HFA 230-21 MCG/ACT inhaler Inhale 2 puffs into the lungs 2 (two) times daily. 11/23/14    Biagio Borg, MD  aspirin EC 81 MG tablet Take 81 mg by mouth daily.    [provider]  carbidopa-levodopa (SINEMET IR) 25-100 MG tablet Take 2 tablets by mouth 3 (three) times daily. for parkinson's disease    [provider]  escitalopram (LEXAPRO) 20 MG tablet Take 20 mg by mouth daily.     [provider]  gabapentin (NEURONTIN) 100 MG capsule Take 100 mg by mouth 2 (two) times daily.    [provider]  levothyroxine (SYNTHROID) 88 MCG tablet Take 88 mcg by mouth daily at 6 (six) AM.     [provider]  LORazepam (ATIVAN) 1 MG tablet Take 1 mg by mouth every 4 (four) hours as needed for anxiety.     [provider]  ondansetron (ZOFRAN) 4 MG tablet Take 1  tablet (4 mg total) by mouth every 6 (six) hours as needed for nausea. 10/23/19   Aline August, MD  polyethylene glycol (MIRALAX / GLYCOLAX) 17 g packet Take 17 g by mouth 2 (two) times daily. 10/23/19   Aline August, MD  QUEtiapine (SEROQUEL) 25 MG tablet Take 25 mg by mouth every 6 (six) hours as needed (agitation).    [provider]  Riboflavin (VITAMIN B-2 PO) Take 1,000 mcg by mouth daily.     [provider]  senna-docusate (SENOKOT-S) 8.6-50 MG tablet Take 1 tablet by mouth 2 (two) times daily. 10/23/19   Aline August, MD  traMADol (ULTRAM) 50 MG tablet Take 50 mg by mouth every 6 (six) hours as needed for moderate pain.    [provider]    Allergies    Relafen [nabumetone], Lipitor [atorvastatin], Other, and Penicillins  Review of Systems   Review of Systems  Unable to perform ROS: Mental status change  Neurological: Positive for weakness.    Physical Exam Updated Vital Signs There were no vitals taken for this visit.  Physical Exam Vitals and nursing note reviewed.  Constitutional:      General: She is not in acute distress.    Appearance: She is well-developed. She is cachectic.  HENT:     Head: Normocephalic and atraumatic.    Eyes:     Conjunctiva/sclera: Conjunctivae normal.  Cardiovascular:     Rate and Rhythm: Normal rate and regular rhythm.     Heart sounds: No murmur.  Pulmonary:     Effort: Tachypnea present. No respiratory distress.     Breath sounds: Normal breath sounds.  Abdominal:     Palpations: Abdomen is soft.     Tenderness: There is no abdominal tenderness.  Genitourinary:    Comments: Patient has a lot of clotted blood in her perineal area.  We cleaned her up and it does not appear to be coming from the vagina.  There is copious active bleeding coming from her rectum.  Digital rectal exam showed some hard stool balls some of which were removed but she continues to ooze blood.  No rectal masses were appreciated Musculoskeletal:        General: No deformity or signs of injury.     Cervical back: Neck supple.  Skin:    General: Skin is warm and dry.  Neurological:     Comments: Patient is arousable to voice.  Will follow some simple commands.  Nonverbal.     ED Results / Procedures / Treatments   Labs (all labs ordered are listed, but only abnormal results are displayed) Labs Reviewed  COMPREHENSIVE METABOLIC PANEL - Abnormal; Notable for the following components:      Result Value   Sodium 153 (*)    Chloride 120 (*)    CO2 21 (*)    Glucose, Bld 138 (*)    BUN 104 (*)    Creatinine, Ser 2.54 (*)    Calcium 8.6 (*)    Albumin 2.5 (*)    GFR calc non Af Amer 17 (*)    GFR calc Af Amer 20 (*)    All other components within normal limits  CBC WITH DIFFERENTIAL/PLATELET - Abnormal; Notable for the following components:   WBC 12.4 (*)    RBC 2.75 (*)    Hemoglobin 9.0 (*)    HCT 29.2 (*)    MCV 106.2 (*)    Neutro Abs 10.2 (*)    Abs Immature Granulocytes 0.08 (*)  All other components within normal limits  URINALYSIS, ROUTINE W REFLEX MICROSCOPIC - Abnormal; Notable for the following components:   APPearance CLOUDY (*)    Hgb urine dipstick SMALL (*)    Ketones, ur 5 (*)     Protein, ur 100 (*)    Leukocytes,Ua LARGE (*)    WBC, UA >50 (*)    Bacteria, UA RARE (*)    All other components within normal limits  LACTIC ACID, PLASMA - Abnormal; Notable for the following components:   Lactic Acid, Venous 3.0 (*)    All other components within normal limits  RESPIRATORY PANEL BY RT PCR (FLU A&B, COVID)  URINE CULTURE  MRSA PCR SCREENING  PROTIME-INR  LACTIC ACID, PLASMA  HEMOGLOBIN AND HEMATOCRIT, BLOOD  HEMOGLOBIN AND HEMATOCRIT, BLOOD  BASIC METABOLIC PANEL  TYPE AND SCREEN  PREPARE RBC (CROSSMATCH)  PREPARE RBC (CROSSMATCH)    EKG EKG Interpretation  Date/Time:  Tuesday November 24 2019 19:27:37 EDT Ventricular Rate:  106 PR Interval:    QRS Duration: 77 QT Interval:  332 QTC Calculation: 441 R Axis:   85 Text Interpretation: Sinus tachycardia Borderline right axis deviation Borderline low voltage, extremity leads Abnormal R-wave progression, late transition Baseline wander in lead(s) V1 No significant change since prior 3/21 Confirmed by Aletta Edouard [phone]) on 11/05/2019 7:41:43 PM   Radiology No results found.  Procedures .Critical Care Performed by: [name], MD Authorized by: [name], MD   Critical care provider statement:    Critical care time (minutes):  80   Critical care time was exclusive of:  Separately billable procedures and treating other patients   Critical care was necessary to treat or prevent imminent or life-threatening deterioration of the following conditions:  Shock   Critical care was time spent personally by me on the following activities:  Discussions with consultants, evaluation of patient's response to treatment, examination of patient, ordering and performing treatments and interventions, ordering and review of laboratory studies, ordering and review of radiographic studies, pulse oximetry, re-evaluation of patient's condition, obtaining history from patient or surrogate, review of old charts and  development of treatment plan with patient or surrogate   I assumed direction of critical care for this patient from another provider in my specialty: no     (including critical care time)  Medications Ordered in ED Medications  0.9 %  sodium chloride infusion (has no administration in time range)  pantoprazole (PROTONIX) injection 40 mg (has no administration in time range)  Chlorhexidine Gluconate Cloth 2 % PADS 6 each (has no administration in time range)  MEDLINE mouth rinse (has no administration in time range)  sodium chloride 0.9 % bolus 1,000 mL (0 mLs Intravenous Stopped 11/25/2019 2236)  sodium chloride 0.9 % bolus 1,000 mL (0 mLs Intravenous Stopped 10/29/2019 2236)  cefTRIAXone (ROCEPHIN) 1 g in sodium chloride 0.9 % 100 mL IVPB (0 g Intravenous Stopped 10/29/2019 2239)    ED Course  I have reviewed the triage vital signs and the nursing notes.  Pertinent labs & imaging results that were available during my care of the patient were reviewed by me and considered in my medical decision making (see chart for details).  Clinical Course as of Nov 23 2329  Tue Nov 24, 2019  1930 Gust with patient's daughter Leda Gauze who is in agreement for blood transfusions and a procedure if she needs it.  She did say the patient has a DNR form and would not want CPR.   [MB]  1931 Lactic acid elevated at 3.0.  Low this could reflect infection because her urine looks dirty I think this probably related to level of shock from her GI bleeding.  IV fluids infusing, antibiotics ordered, blood transfusion ordered   [MB]  2013 Patient's med list, she does not appear to be on any anticoagulation.  Initial hemoglobin coming back at 9 which is lower than her baseline 1 month ago which was close to 10.  Prior to that it looks like it was closer to 11   [MB]  2044 Epic flagging possible cross reaction with penicillin allergy.  I reviewed the prior epic medication list and she received ceftriaxone multiple times in the  past.   [MB]  2050 Discussed with Dr. Henrene Pastor from lower GI.  He has reviewed my chart and her prior work-up.  He thinks this is most likely due to [place] to choreal ulcer from her chronic constipation although could also be a diverticular bleed.  His recommendation is resuscitation with blood and fluids and when her creatinine will allow it she may need a CTA.  Their team will evaluate the patient tomorrow.   [MB]  2117 Discussed with Dr. Flossie Buffy from [facility] hospitalist to evaluate the patient for admission.   [MB]  2203 Discussed with Dr. Lucile Shutters critical care who will let somebody from the team come down and evaluate   [MB]  2246 Critical care down to evaluate patient now.   [MB]    Clinical Course User Index [MB] [name], MD   MDM Rules/Calculators/A&P                     This patient complains of altered mental status; this involves an extensive number of treatment Options and is a complaint that carries with it a high risk of complications and Morbidity. The differential includes shock, sepsis, metabolic derangement, stroke  I ordered, reviewed and interpreted labs, which included elevated white count of 12.4, hemoglobin lower, new AKI with elevated creatinine, hyponatremic with an elevated sodium I ordered medication IV fluids and blood transfusion, antibiotic's Additional history obtained from patient's caregiver Previous records obtained and reviewed in epic I consulted gastroenterology Dr. Henrene Pastor, [facility] hospitalist Dr. Flossie Buffy, critical care attending Dr. Halford Chessman and discussed lab and imaging findings  Critical Interventions: Identification of massive GI bleeding and fluid and blood resuscitation  After the interventions stated above, I reevaluated the patient and found the patient to remain actively bleeding.  Remains hypotensive.  Mental status actually improved from when she arrived.  Patient is DNR/DNI.  Critical care is admitting the patient to their service although we all agree that  she likely will not survive this hospitalization.   Final Clinical Impression(s) / ED Diagnoses Final diagnoses:  Rectal bleeding  Hypotension due to hypovolemia  AKI (acute kidney injury) [facility])    Rx / DC Orders ED Discharge Orders    None       [name], MD 11/25/19 1027

## 2019-11-24 NOTE — ED Notes (Signed)
Pt having multiple large bloody bowel movements

## 2019-11-24 NOTE — ED Notes (Signed)
Notified EDP of decreased rectal temp. Applied additional warm blankets.

## 2019-11-24 NOTE — H&P (Signed)
[name], MRN:  [id number], DOB:  1935-03-16, LOS: 0 ADMISSION DATE:  11/10/2019, CONSULTATION DATE:  11/08/2019 REFERRING MD:  Dr. Melina Copa, ER, CHIEF COMPLAINT:  Rectal bleeding   Brief History   85 yo female presented to ER with rectal bleeding with evidence for hemorrhagic shock.  She non verbal, cachectic and bed/wheelchair bound with hx of Parkinson's dementia, and CVA with frequent falls.  She is DNR/DNI and being assessed for hospice care.  History of present illness   85 yo female you requires 24 hours care in setting of dementia from Parkinson's disease and CVA was found by her care giver to have low blood pressure and rectal bleeding.  Had problems recently from constipation.  She has not been able to eat or drink consistently and has been losing weight.  She is followed by [facility] palliative care services and was recently deemed eligible for hospice care.  In the ER she was noted to continue to have rectal bleeding.  She was found to have AKI and hypotension.  She was started on IV fluids and PRBC transfusions.  ER physician d/w GI.  She would require bleeding scan to localize source of bleeding, but not able to have test done due to renal function.  In ER she was tachycardic with SBP in the 50's with shallow breathing pattern.  Past Medical History  CVA, Pulmonary hypertension, PNA, Parkinson's disease with dementia, Hypothyroidism, HTN, HLD, GERD, Diverticulosis, Depression, COPD, Anxiety, Asthma  Significant Hospital Events   4/27 Admit  Consults:  Gastroenterology rectal bleeding 4/27  Procedures:    Significant Diagnostic Tests:    Micro Data:  SARS CoV2 PCR 4/27 >> negative Influenza PCR 4/27 >> negative Urine 4/27 >>   Antimicrobials:  Rocephin 4/27  Interim history/subjective:    Objective   BP (!) 92/48   Pulse 100   Temp (!) 96.8 F (36 C) (Rectal)   Resp (!) 22   SpO2 100%         Intake/Output Summary (Last 24 hours) at 11/02/2019  2318 Last data filed at 10/29/2019 2236 Gross per 24 hour  Intake 2000 ml  Output --  Net 2000 ml   There were no vitals filed for this visit.  Examination:  General - cachectic, pale Eyes - pupils reactive ENT - edentulous, dry mucosa Cardiac - regular, tachycardic Chest - shallow respiratory pattern with periods of apnea Abdomen - thin, soft, non tender, decreased bowel sounds Extremities - decreased muscle bulk, extremities cool to touch Skin - no rashes Neuro - opens eyes with stimulation, moves extremities, moans intermittently   Resolved Hospital Problem list     Assessment & Plan:   Hemorrhagic shock from acute blood loss anemia in setting of rectal bleeding. - mostly likely lower GI source with hx of diverticulosis - not much to offer at this point - continue IV fluids - f/u Hb after PRBC transfusion  AKI from ATN in setting of hemorrhagic shock. Hypernatremia. - continue resuscitation - f/u BMET - would not be candidate for renal replacement  Dementia from Parkinson's disease and hx of CVA. Hx of depression, anxiety. - hold outpt sinemet, aspirin, celexa, lexapro, neurontin, ativan, seroquel, tramadol  Hx of hypothyroidism. - synthroid IV at half home dose  Dysphagia. Cachexia. - NPO  Goals of care. - DNR/DNI - had detailed d/w pt's daughter, Julian Hy.  Family understands that Ms. Dillehay has continued decline in health status and that she most likely will not survive this hospitalization.  Decision made to f/u lab tests after she completes current order for blood transfusions.  If medical status continues to deteriorate and she continues to have rectal bleeding, then nothing further can be offered.  At that point we will not escalate care and instead focus on comfort measures.  Best practice:  Diet: NPO DVT prophylaxis: SCDs GI prophylaxis: Protonix Mobility: Bed rest, fall risk Code Status: DNR/DNI Family Communication: updated pt's daughter  by phone Disposition: Progressive care  Labs   CBC: Recent Labs  Lab 11/18/2019 1944  WBC 12.4*  NEUTROABS 10.2*  HGB 9.0*  HCT 29.2*  MCV 106.2*  PLT 0000000    Basic Metabolic Panel: Recent Labs  Lab 11/01/2019 1944  NA 153*  K 4.8  CL 120*  CO2 21*  GLUCOSE 138*  BUN 104*  CREATININE 2.54*  CALCIUM 8.6*   GFR: CrCl cannot be calculated (Unknown ideal weight.). Recent Labs  Lab 11/03/2019 1944 11/13/2019 1948  WBC 12.4*  --   LATICACIDVEN  --  3.0*    Liver Function Tests: Recent Labs  Lab 11/11/2019 1944  AST 16  ALT <5  ALKPHOS 70  BILITOT 0.7  PROT 7.0  ALBUMIN 2.5*   No results for input(s): LIPASE, AMYLASE in the last 168 hours. No results for input(s): AMMONIA in the last 168 hours.  ABG    Component Value Date/Time   PHART 7.385 02/18/2014 0904   PCO2ART 42.2 02/18/2014 0904   PO2ART 61.0 (L) 02/18/2014 0904   HCO3 27.2 02/27/2018 0056   TCO2 29 02/27/2018 0056   ACIDBASEDEF 1.0 02/18/2014 0908   O2SAT 24.0 02/27/2018 0056     Coagulation Profile: Recent Labs  Lab 11/23/2019 1944  INR 1.2    Cardiac Enzymes: No results for input(s): CKTOTAL, CKMB, CKMBINDEX, TROPONINI in the last 168 hours.  HbA1C: No results found for: HGBA1C  CBG: No results for input(s): GLUCAP in the last 168 hours.  Review of Systems:   Unable to obtain  Past Medical History  She,  has a past medical history of Acute cystitis, Anemia, Anxiety, Aortic stenosis, mild (11/18/2013), Asthma, Back pain, Bronchitis, acute, Carotid artery occlusion, Cerebrovascular disease, COPD (chronic obstructive pulmonary disease) ([place]), Dementia ([place]), Depression, Diverticulosis of colon, DJD (degenerative joint disease), Fall (07/05/2015), GERD (gastroesophageal reflux disease), History of sudden visual loss, Hyperlipidemia, Hypertension, Hypothyroidism, Parkinson's disease ([place]), Pneumonia (2015), Pulmonary hypertension, moderate to severe ([place]) (11/19/2014), Shortness of breath, Stroke  ([place]), Transient ischemic attack, and UTI (lower urinary tract infection).   Surgical History    Past Surgical History:  Procedure Laterality Date  . CARDIAC CATHETERIZATION  02/18/14  . CARDIOTHORACIC PROCEDURE  01/13/2009   Right   . COLECTOMY    . ENDARTERECTOMY     right carotid endartarectomy - 2010  . FOOT SURGERY     nerve cut between toes   . LEFT AND RIGHT HEART CATHETERIZATION WITH CORONARY ANGIOGRAM N/A 02/18/2014   Procedure: LEFT AND RIGHT HEART CATHETERIZATION WITH CORONARY ANGIOGRAM;  Surgeon: Larey Dresser, MD;  [facility];  Service: Cardiovascular;  Laterality: N/A;  . REPLACEMENT TOTAL KNEE     right and left knee  . TEE WITHOUT CARDIOVERSION N/A 02/08/2014   Procedure: TRANSESOPHAGEAL ECHOCARDIOGRAM (TEE);  Surgeon: Larey Dresser, MD;  Location: [facility];  Service: Cardiovascular;  Laterality: N/A;  . TOTAL KNEE ARTHROPLASTY Left 11/17/2012   Procedure: TOTAL KNEE ARTHROPLASTY;  Surgeon: Gearlean Alf, MD;  Location: [facility];  Service: Orthopedics;  Laterality: Left;  .  TUBAL LIGATION    . VAGINAL HYSTERECTOMY       Social History   reports that she has never smoked. She has never used smokeless tobacco. She reports that she does not drink alcohol or use drugs.   Family History   Her family history includes AAA (abdominal aortic aneurysm) in her sister; Coronary artery disease in her sister; Emphysema in her father; Heart attack in her brother; Heart disease in her mother and sister; Hyperlipidemia in her mother; Hypertension in her mother and sister; Rheumatic fever in her brother; Varicose Veins in her sister.   Allergies Allergies  Allergen Reactions  . Relafen [Nabumetone] Diarrhea and Other (See Comments)    GI / Urinary Bleeding  . Lipitor [Atorvastatin] Other (See Comments)    Causes memory loss  . Other Other (See Comments)    "orvail" unknown:  Causes Bloody stool  . Amoxicillin Rash  . Penicillins Hives, Itching, Swelling and Rash      Has patient had a PCN reaction causing immediate rash, facial/tongue/throat swelling, SOB or lightheadedness with hypotension: no Has patient had a PCN reaction causing severe rash involving mucus membranes or skin necrosis: unknown Has patient had a PCN reaction that required hospitalization no Has patient had a PCN reaction occurring within the last 10 years: no If all of the above answers are "NO", then may proceed with Cephalosporin use.      Home Medications  Prior to Admission medications   Medication Sig Start Date End Date Taking? Authorizing Provider  acetaminophen (TYLENOL) 500 MG tablet Take 500 mg by mouth every 6 (six) hours as needed for moderate pain.   Yes [provider]  ADVAIR HFA 230-21 MCG/ACT inhaler Inhale 2 puffs into the lungs 2 (two) times daily. 11/23/14  Yes Biagio Borg, MD  aspirin EC 81 MG tablet Take 81 mg by mouth daily.   Yes [provider]  carbidopa-levodopa (SINEMET IR) 25-100 MG tablet Take 2 tablets by mouth 3 (three) times daily. for parkinson's disease   Yes [provider]  citalopram (CELEXA) 20 MG tablet Take 20 mg by mouth daily. 11/18/19  Yes [provider]  Cyanocobalamin (VITAMIN B 12 PO) Take 1 tablet by mouth daily.   Yes [provider]  escitalopram (LEXAPRO) 20 MG tablet Take 20 mg by mouth daily.    Yes [provider]  gabapentin (NEURONTIN) 100 MG capsule Take 100 mg by mouth 2 (two) times daily.   Yes [provider]  levothyroxine (SYNTHROID) 88 MCG tablet Take 88 mcg by mouth daily at 6 (six) AM.    Yes [provider]  LORazepam (ATIVAN) 1 MG tablet Take 1 mg by mouth every 4 (four) hours as needed for anxiety.    Yes [provider]  polyethylene glycol (MIRALAX / GLYCOLAX) 17 g packet Take 17 g by mouth 2 (two) times daily. Patient taking differently: Take 17 g by mouth daily.  10/23/19  Yes Aline August, MD  QUEtiapine (SEROQUEL) 25 MG tablet  Take 25 mg by mouth in the morning and at bedtime.    Yes [provider]  traMADol (ULTRAM) 50 MG tablet Take 50 mg by mouth every 6 (six) hours as needed for moderate pain.    [provider]     Critical care time: 42 minutes    Chesley Mires, MD [place] Pager - [phone] 11/19/2019, 11:38 PM

## 2019-11-25 ENCOUNTER — Other Ambulatory Visit: Payer: Self-pay

## 2019-11-25 DIAGNOSIS — D62 Acute posthemorrhagic anemia: Secondary | ICD-10-CM

## 2019-11-25 DIAGNOSIS — E872 Acidosis: Secondary | ICD-10-CM

## 2019-11-25 DIAGNOSIS — N179 Acute kidney failure, unspecified: Secondary | ICD-10-CM | POA: Diagnosis not present

## 2019-11-25 DIAGNOSIS — E860 Dehydration: Secondary | ICD-10-CM

## 2019-11-25 DIAGNOSIS — R578 Other shock: Secondary | ICD-10-CM | POA: Diagnosis not present

## 2019-11-25 LAB — CBC
HCT: 40.4 % (ref 36.0–46.0)
Hemoglobin: 13.8 g/dL (ref 12.0–15.0)
MCH: 30.8 pg (ref 26.0–34.0)
MCHC: 34.2 g/dL (ref 30.0–36.0)
MCV: 90.2 fL (ref 80.0–100.0)
Platelets: 175 10*3/uL (ref 150–400)
RBC: 4.48 MIL/uL (ref 3.87–5.11)
RDW: 17 % — ABNORMAL HIGH (ref 11.5–15.5)
WBC: 13.5 10*3/uL — ABNORMAL HIGH (ref 4.0–10.5)
nRBC: 0 % (ref 0.0–0.2)

## 2019-11-25 LAB — BASIC METABOLIC PANEL
Anion gap: 8 (ref 5–15)
BUN: 96 mg/dL — ABNORMAL HIGH (ref 8–23)
CO2: 17 mmol/L — ABNORMAL LOW (ref 22–32)
Calcium: 7.5 mg/dL — ABNORMAL LOW (ref 8.9–10.3)
Chloride: 129 mmol/L — ABNORMAL HIGH (ref 98–111)
Creatinine, Ser: 2.18 mg/dL — ABNORMAL HIGH (ref 0.44–1.00)
GFR calc Af Amer: 24 mL/min — ABNORMAL LOW (ref >60)
GFR calc non Af Amer: 20 mL/min — ABNORMAL LOW (ref >60)
Glucose, Bld: 153 mg/dL — ABNORMAL HIGH (ref 70–99)
Potassium: 5.2 mmol/L — ABNORMAL HIGH (ref 3.5–5.1)
Sodium: 154 mmol/L — ABNORMAL HIGH (ref 135–145)

## 2019-11-25 LAB — URINE CULTURE

## 2019-11-25 LAB — LACTIC ACID, PLASMA: Lactic Acid, Venous: 1.5 mmol/L (ref 0.5–1.9)

## 2019-11-25 MED ORDER — GLYCOPYRROLATE 0.2 MG/ML IJ SOLN: 0.2000 mg | INTRAMUSCULAR | Status: DC | PRN

## 2019-11-25 MED ORDER — ACETAMINOPHEN 325 MG PO TABS: 650.0000 mg | ORAL_TABLET | Freq: Four times a day (QID) | ORAL | Status: DC | PRN

## 2019-11-25 MED ORDER — DIPHENHYDRAMINE HCL 50 MG/ML IJ SOLN: 25.0000 mg | INTRAMUSCULAR | Status: DC | PRN

## 2019-11-25 MED ORDER — POLYVINYL ALCOHOL 1.4 % OP SOLN
1.0000 [drp] | Freq: Four times a day (QID) | OPHTHALMIC | Status: DC | PRN
  Filled 2019-11-25: qty 15

## 2019-11-25 MED ORDER — MORPHINE BOLUS VIA INFUSION
5.0000 mg | INTRAVENOUS | Status: DC | PRN
  Filled 2019-11-25: qty 5

## 2019-11-25 MED ORDER — ONDANSETRON HCL 4 MG/2ML IJ SOLN: 4.0000 mg | Freq: Four times a day (QID) | INTRAMUSCULAR | Status: DC | PRN

## 2019-11-25 MED ORDER — MORPHINE 100MG IN NS 100ML (1MG/ML) PREMIX INFUSION
0.0000 mg/h | INTRAVENOUS | Status: DC
  Administered 2019-11-25: 0.1 mg/h via INTRAVENOUS
  Administered 2019-11-25: 1 mg/h via INTRAVENOUS
  Filled 2019-11-25: qty 100

## 2019-11-25 MED ORDER — LORAZEPAM 2 MG/ML IJ SOLN: 2.0000 mg | INTRAMUSCULAR | Status: DC | PRN

## 2019-11-25 MED ORDER — GLYCOPYRROLATE 1 MG PO TABS: 1.0000 mg | ORAL_TABLET | ORAL | Status: DC | PRN

## 2019-11-25 MED ORDER — ONDANSETRON 4 MG PO TBDP: 4.0000 mg | ORAL_TABLET | Freq: Four times a day (QID) | ORAL | Status: DC | PRN

## 2019-11-25 MED ORDER — ACETAMINOPHEN 650 MG RE SUPP: 650.0000 mg | Freq: Four times a day (QID) | RECTAL | Status: DC | PRN

## 2019-11-25 MED ORDER — MORPHINE SULFATE (PF) 2 MG/ML IV SOLN: 2.0000 mg | INTRAVENOUS | Status: DC | PRN

## 2019-11-25 NOTE — Progress Notes (Addendum)
PCCM Brief Interval Note-- Full progress note to follow  Patient admitted overnight to PCCM service with hemorrhagic shock 2/2 GIB. Baseline very chronically ill and debilitated, and was being considered for hospice care. PCCM overnight discussed with family if failure to improve, transition to comfort care.   I assessed pt this morning with SBP 60s, ongoing GIB, and appearing to be actively dying.   Attempted to reach daughter, notified that she is actively en route to hospital. I was able to reach on moblie-- We discussed pt's ongoing decline and agree to transition to comfort care.   P -comfort care  -Family to arrive at bedside this morning     Eliseo Gum MSN, AGACNP-BC [place] [phone] If no answer, [phone] 11/25/2019, 10:20 AM

## 2019-11-25 NOTE — Progress Notes (Signed)
[name] [id number] Admission Data: 11/25/2019 12:25 PM Attending Provider: Chesley Mires, MD  [id number], Seth Bake, NP Consults/ Treatment Team: Treatment Team:  Lavena Bullion, DO  [name] is a 85 y.o. female patient admitted from ED awake, alert  & orientated  X 3,  DNR, VSS - Blood pressure (!) 74/38, pulse (!) 124, temperature 97.6 F (36.4 C), temperature source Axillary, resp. rate (!) 29, SpO2 100 %., O2   2L Venti mask.   Pt orientation to unit, room and routine. Information packet given to patient/family and safety video watched.  Admission INP armband ID verified with patient/family, and in place. SR up x 2, fall risk assessment complete with Patient and family verbalizing understanding of risks associated with falls. Pt verbalizes an understanding of how to use the call bell and to call for help before getting out of bed.  Skin, clean-dry- intact . Patient in comfort care at this time and family is at bed side.        Hosie Spangle, RN 11/25/2019 12:25 PM

## 2019-11-25 NOTE — Progress Notes (Signed)
   11/25/19 1100  Clinical Encounter Type  Visited With Patient and family together  Visit Type Patient actively dying  Referral From Nurse  Consult/Referral To Chaplain  Spiritual Encounters  Spiritual Needs Other (Comment) (None at this time)   Chaplain responded to consult for end of life, family support. Family stated that they were fine. Chaplain made family aware that Chaplains can be reached by pager per RN if needs arise. Chaplains remain available for support as needs arise.   Chaplain Resident, Evelene Croon, M Div [phone] on-call pager

## 2019-11-25 NOTE — Progress Notes (Signed)
NAMEAmilya [name], MRN:  [id number], DOB:  09-15-1934, LOS: 1 ADMISSION DATE:  11/19/2019, CONSULTATION DATE:  11/01/2019 REFERRING MD:  Dr. Melina Copa, ER, CHIEF COMPLAINT:  Rectal bleeding   Brief History   85 yo female presented to ER with rectal bleeding with evidence for hemorrhagic shock.  She non verbal, cachectic and bed/wheelchair bound with hx of Parkinson's dementia, and CVA with frequent falls.  She is DNR/DNI and being assessed for hospice care.  History of present illness   85 yo female you requires 24 hours care in setting of dementia from Parkinson's disease and CVA was found by her care giver to have low blood pressure and rectal bleeding.  Had problems recently from constipation.  She has not been able to eat or drink consistently and has been losing weight.  She is followed by [facility] palliative care services and was recently deemed eligible for hospice care.  In the ER she was noted to continue to have rectal bleeding.  She was found to have AKI and hypotension.  She was started on IV fluids and PRBC transfusions.  ER physician d/w GI.  She would require bleeding scan to localize source of bleeding, but not able to have test done due to renal function.  In ER she was tachycardic with SBP in the 50's with shallow breathing pattern.  Past Medical History  CVA, Pulmonary hypertension, PNA, Parkinson's disease with dementia, Hypothyroidism, HTN, HLD, GERD, Diverticulosis, Depression, COPD, Anxiety, Asthma  Significant Hospital Events   4/27 Admit  Consults:  Gastroenterology rectal bleeding 4/27  Procedures:    Significant Diagnostic Tests:    Micro Data:  SARS CoV2 PCR 4/27 >> negative Influenza PCR 4/27 >> negative Urine 4/27 >>   Antimicrobials:  Rocephin 4/27  Interim history/subjective:    Objective   BP (!) 66/48 (BP Location: Left Arm)   Pulse (!) 108   Temp 97.8 F (36.6 C) (Oral)   Resp (!) 36   SpO2 100%         Intake/Output Summary (Last  24 hours) at 11/25/2019 1024 Last data filed at 11/25/2019 0535 Gross per 24 hour  Intake 3575 ml  Output --  Net 3575 ml   There were no vitals filed for this visit.  Examination:  General - Cachectic, frail, elderly  HEENT: temporal muscle wasting. Anicteric sclera. Trachea midline  Cardiac - tachycardic rate. 1+ pulses Chest - Shallow, unlabored respirations. NRB  Abdomen - Thin, soft.  Extremities - Symmetrical muscle wasting. No obvious joint deformity  Skin - pale, c/d/cool  Neuro - Does not follow commands   Resolved Hospital Problem list     Assessment & Plan:   Goals of care. -Resuscitative efforts overnight attempted without gross improvement. Pt continues to decline and now appears to be actively dying 4/28 - DNR/DNI - Transitioning to comfort care 4/28 due to ongoing GIB, worsening shock  -morphine, PRN ativan, no further labs/tests   Hemorrhagic shock from acute blood loss anemia in setting of rectal bleeding. - s/p transfusion with ongoing bleeding - congruent with PCCM / family discussions at time of admission, transitioning to comfort care   AKI from ATN in setting of hemorrhagic shock. Hypernatremia. - would not be candidate for renal replacement - No further labs, comfort care   Dementia from Parkinson's disease and hx of CVA. Hx of depression, anxiety. - holding outpt meds   Hx of hypothyroidism. - holding home synthroid   Dysphagia. Cachexia. - NPO    Best  practice:  Diet: NPO DVT prophylaxis: ok to dc SCD for comfort care  GI prophylaxis: Protonix Mobility: Bed rest, fall risk Code Status: DNR/DNI Family Communication: Family updated on phone 4/28,  If able Will also try to see at bedside for emotional support.  Disposition: Progressive care  Labs   CBC: Recent Labs  Lab 11/25/2019 1944 11/25/19 0811  WBC 12.4* 13.5*  NEUTROABS 10.2*  --   HGB 9.0* 13.8  HCT 29.2* 40.4  MCV 106.2* 90.2  PLT 384 0000000    Basic Metabolic  Panel: Recent Labs  Lab 11/10/2019 1944 11/25/19 0811  NA 153* 154*  K 4.8 5.2*  CL 120* 129*  CO2 21* 17*  GLUCOSE 138* 153*  BUN 104* 96*  CREATININE 2.54* 2.18*  CALCIUM 8.6* 7.5*   GFR: CrCl cannot be calculated (Unknown ideal weight.). Recent Labs  Lab 11/07/2019 1944 11/03/2019 1948 11/25/19 0811  WBC 12.4*  --  13.5*  LATICACIDVEN  --  3.0* 1.5    Liver Function Tests: Recent Labs  Lab 11/27/2019 1944  AST 16  ALT <5  ALKPHOS 70  BILITOT 0.7  PROT 7.0  ALBUMIN 2.5*   No results for input(s): LIPASE, AMYLASE in the last 168 hours. No results for input(s): AMMONIA in the last 168 hours.  ABG    Component Value Date/Time   PHART 7.385 02/18/2014 0904   PCO2ART 42.2 02/18/2014 0904   PO2ART 61.0 (L) 02/18/2014 0904   HCO3 27.2 02/27/2018 0056   TCO2 29 02/27/2018 0056   ACIDBASEDEF 1.0 02/18/2014 0908   O2SAT 24.0 02/27/2018 0056     Coagulation Profile: Recent Labs  Lab 11/22/2019 1944  INR 1.2    Cardiac Enzymes: No results for input(s): CKTOTAL, CKMB, CKMBINDEX, TROPONINI in the last 168 hours.  HbA1C: No results found for: HGBA1C  CBG: No results for input(s): GLUCAP in the last 168 hours.    Eliseo Gum MSN, AGACNP-BC [place] [id number] If no answer, [id number] 11/25/2019, 10:30 AM

## 2019-11-25 NOTE — ED Notes (Signed)
Help get patient pulled up in the bed patient is resting with call bell in reach and family at bedside

## 2019-11-25 NOTE — ED Notes (Signed)
Attempted report 

## 2019-11-26 DIAGNOSIS — Z515 Encounter for palliative care: Secondary | ICD-10-CM

## 2019-11-26 LAB — TYPE AND SCREEN
ABO/RH(D): AB POS
Antibody Screen: NEGATIVE
Unit division: 0
Unit division: 0
Unit division: 0
Unit division: 0

## 2019-11-26 LAB — BPAM RBC
Blood Product Expiration Date: 202105082359
Blood Product Expiration Date: 202105222359
Blood Product Expiration Date: 202105262359
Blood Product Expiration Date: 202105262359
ISSUE DATE / TIME: 202104272055
ISSUE DATE / TIME: 202104272320
ISSUE DATE / TIME: 202104280115
ISSUE DATE / TIME: 202104280311
Unit Type and Rh: 6200
Unit Type and Rh: 6200
Unit Type and Rh: 8400
Unit Type and Rh: 8400

## 2019-11-28 NOTE — Death Summary Note (Addendum)
Patient passed at 1500. Patient pronounced by myself and Lorella Nimrod RN. Both RNs wasted 35 cc morphine down the sink. Multiple family members at bedside. Dr Tamala Julian CCM notified. [place] [facility] Notified and ruled out for donation.

## 2019-11-28 NOTE — Death Summary Note (Addendum)
DEATH SUMMARY   Patient Details  Name: [name] MRN: [id number] DOB: 1935-05-30  Admission/Discharge Information   Admit Date:  12/02/2019  Date of Death:   12-04-19  Time of Death:  1500  Length of Stay: 2  Referring Physician: Ferd Hibbs, NP   Reason(s) for Hospitalization  Hemorrhagic shock in setting of GI Bleed   Diagnoses  Preliminary cause of death: Hemorrhagic shock Secondary Diagnoses (including complications and co-morbidities): GI Bleed, Failure to thrive Active Problems:   Rectal bleeding   Brief Hospital Course (including significant findings, care, treatment, and services provided and events leading to death)  Jinnie Chaffer is a 85 y.o. year old female (prior to presentation was being evaluated for hospice care) who presented to [facility] ED 2019/12/02 with persistent rectal bleeding, found to be in hemorrhagic shock with SBP in 50s due to GI bleed. The patient was transfused 4 PRBC. PCCM discussed goals of care and a decision was reached to admit to SDU, with plan to transition to comfort care if further decompensated.  11/25/19 the patient had ongoing GI bleeding and remained hypotensive with SBP in 60s. Discussed with family and transitioned to comfort care. Analgesia and anxiolysis ordered, with aim to titrate based on symptoms. No further labs, no further transfusions. Family at bedside to visit.  2019/12/04 Family at bedside to visit. Comfort care measures continued. Patient died.     Pertinent Labs and Studies  Significant Diagnostic Studies No results found.  Microbiology Recent Results (from the past 240 hour(s))  Urine culture     Status: Abnormal   Collection Time: Dec 02, 2019  7:32 PM   Specimen: Urine, Random  Result Value Ref Range Status   Specimen Description URINE, RANDOM  Final   Special Requests   Final    NONE Performed at [facility], [address], [place], [place] [address]    Culture MULTIPLE SPECIES PRESENT, SUGGEST  RECOLLECTION (A)  Final   Report Status 11/25/2019 FINAL  Final  Respiratory Panel by RT PCR (Flu A&B, Covid) - Nasopharyngeal Swab     Status: None   Collection Time: 2019-12-02  7:58 PM   Specimen: Nasopharyngeal Swab  Result Value Ref Range Status   SARS Coronavirus 2 by RT PCR NEGATIVE NEGATIVE Final    Comment: (NOTE) SARS-CoV-2 target nucleic acids are NOT DETECTED. The SARS-CoV-2 RNA is generally detectable in upper respiratoy specimens during the acute phase of infection. The lowest concentration of SARS-CoV-2 viral copies this assay can detect is 131 copies/mL. A negative result does not preclude SARS-Cov-2 infection and should not be used as the sole basis for treatment or other patient management decisions. A negative result may occur with  improper specimen collection/handling, submission of specimen other than nasopharyngeal swab, presence of viral mutation(s) within the areas targeted by this assay, and inadequate number of viral copies (<131 copies/mL). A negative result must be combined with clinical observations, patient history, and epidemiological information. The expected result is Negative. Fact Sheet for Patients:  PinkCheek.be Fact Sheet for Healthcare Providers:  GravelBags.it This test is not yet ap proved or cleared by the [place] FDA and  has been authorized for detection and/or diagnosis of SARS-CoV-2 by FDA under an Emergency Use Authorization (EUA). This EUA will remain  in effect (meaning this test can be used) for the duration of the COVID-19 declaration under Section 564(b)(1) of the Act, 21 U.S.C. section 360bbb-3(b)(1), unless the authorization is terminated or revoked sooner.    Influenza A  by PCR NEGATIVE NEGATIVE Final   Influenza B by PCR NEGATIVE NEGATIVE Final    Comment: (NOTE) The Xpert Xpress SARS-CoV-2/FLU/RSV assay is intended as an aid in  the diagnosis of influenza from  Nasopharyngeal swab specimens and  should not be used as a sole basis for treatment. Nasal washings and  aspirates are unacceptable for Xpert Xpress SARS-CoV-2/FLU/RSV  testing. Fact Sheet for Patients: PinkCheek.be Fact Sheet for Healthcare Providers: GravelBags.it This test is not yet approved or cleared by the [place] FDA and  has been authorized for detection and/or diagnosis of SARS-CoV-2 by  FDA under an Emergency Use Authorization (EUA). This EUA will remain  in effect (meaning this test can be used) for the duration of the  Covid-19 declaration under Section 564(b)(1) of the Act, 21  U.S.C. section 360bbb-3(b)(1), unless the authorization is  terminated or revoked. Performed at [facility], [place] [address] [place], [place] [address]     Lab Basic Metabolic Panel: Recent Labs  Lab 11/23/2019 1944 11/25/19 0811  NA 153* 154*  K 4.8 5.2*  CL 120* 129*  CO2 21* 17*  GLUCOSE 138* 153*  BUN 104* 96*  CREATININE 2.54* 2.18*  CALCIUM 8.6* 7.5*   Liver Function Tests: Recent Labs  Lab 11/05/2019 1944  AST 16  ALT <5  ALKPHOS 70  BILITOT 0.7  PROT 7.0  ALBUMIN 2.5*   No results for input(s): LIPASE, AMYLASE in the last 168 hours. No results for input(s): AMMONIA in the last 168 hours. CBC: Recent Labs  Lab 11/12/2019 1944 11/25/19 0811  WBC 12.4* 13.5*  NEUTROABS 10.2*  --   HGB 9.0* 13.8  HCT 29.2* 40.4  MCV 106.2* 90.2  PLT 384 175   Cardiac Enzymes: No results for input(s): CKTOTAL, CKMB, CKMBINDEX, TROPONINI in the last 168 hours. Sepsis Labs: Recent Labs  Lab 11/04/2019 1944 10/29/2019 1948 11/25/19 0811  WBC 12.4*  --  13.5*  LATICACIDVEN  --  3.0* 1.5    Procedures/Operations  none    Eliseo Gum MSN, AGACNP-BC [place] [id number] If no answer, [id number] 12-26-19, 3:16 PM

## 2019-11-28 NOTE — Progress Notes (Signed)
NAMEDemisha [name], MRN:  [id number], DOB:  1935-01-29, LOS: 2 ADMISSION DATE:  11/12/2019, CONSULTATION DATE:  11/12/2019 REFERRING MD:  Dr. Melina Copa, ER, CHIEF COMPLAINT:  Rectal bleeding   Brief History   85 yo female presented to ER with rectal bleeding with evidence for hemorrhagic shock.  She non verbal, cachectic and bed/wheelchair bound with hx of Parkinson's dementia, and CVA with frequent falls.  She is DNR/DNI and being assessed for hospice care.  History of present illness   85 yo female you requires 24 hours care in setting of dementia from Parkinson's disease and CVA was found by her care giver to have low blood pressure and rectal bleeding.  Had problems recently from constipation.  She has not been able to eat or drink consistently and has been losing weight.  She is followed by [facility] palliative care services and was recently deemed eligible for hospice care.  In the ER she was noted to continue to have rectal bleeding.  She was found to have AKI and hypotension.  She was started on IV fluids and PRBC transfusions.  ER physician d/w GI.  She would require bleeding scan to localize source of bleeding, but not able to have test done due to renal function.  In ER she was tachycardic with SBP in the 50's with shallow breathing pattern.  Past Medical History  CVA, Pulmonary hypertension, PNA, Parkinson's disease with dementia, Hypothyroidism, HTN, HLD, GERD, Diverticulosis, Depression, COPD, Anxiety, Asthma  Significant Hospital Events   4/27 Admit  Consults:  Gastroenterology rectal bleeding 4/27  Procedures:    Significant Diagnostic Tests:    Micro Data:  SARS CoV2 PCR 4/27 >> negative Influenza PCR 4/27 >> negative Urine 4/27 >>   Antimicrobials:  Rocephin 4/27  Interim history/subjective:   Minimal morphine overnight despite increased RR, increased HR and notable accessory muscle use.  Discussed with oncoming nurse who plans to titrate morphine gtt for  comfort.   Objective   BP (!) 106/54 (BP Location: Left Arm)   Pulse (!) 109   Temp 97.7 F (36.5 C) (Oral)   Resp (!) 28   SpO2 100%         Intake/Output Summary (Last 24 hours) at 12/14/19 0747 Last data filed at 11/25/2019 1900 Gross per 24 hour  Intake 0.81 ml  Output --  Net 0.81 ml   There were no vitals filed for this visit.  Examination:  General - Cachectic frail elderly female with mild respiratory distress  HEENT: Temporal muscle wasting, pink tacky mm, trachea midline  Cardiac - Tachycardic rate, 1+ radial pulses  Chest - Shallow rapid respirations with scalene and trapezius muscle use  Abdomen - Thin and soft, blood in stool  Extremities - Symmetrical muscle wasting. No obvious joint deformity  Skin - Pale, cool, no rash  Neuro - Somnolent, is not following commands   Resolved Hospital Problem list     Assessment & Plan:   Goals of care. -Resuscitative efforts attempted 4/27-4/28 without gross improvement. Pt continues to decline and appears to be actively dying  P - Comfort care commenced 4/28 -Continue titratable morphine based on RR, HR, signs of pain. Ativan available for anxiolysis, respiratory effort as well.  -if ongoing GI bleed is distressing to family, can place flexiseal   Hemorrhagic shock from acute blood loss anemia in setting of rectal bleeding. - congruent with PCCM / family discussions at time of admission, transitioning to comfort care   AKI from ATN in setting  of hemorrhagic shock. Hypernatremia. - would not be candidate for renal replacement  Dementia from Parkinson's disease and hx of CVA. Hx of depression, anxiety. - holding outpt meds at end of life   Hx of hypothyroidism. - holding home synthroid at end of life   Dysphagia. Cachexia. - End of life, NPO   Best practice:  Diet: NPO DVT prophylaxis: end of life, holding  GI prophylaxis: Protonix Mobility: Bed rest at end of life  Code Status: DNR/DNI Family  Communication: Discussed with family at bedside 4/29.  Disposition: Palliative care   Labs   CBC: Recent Labs  Lab 11/14/2019 1944 11/25/19 0811  WBC 12.4* 13.5*  NEUTROABS 10.2*  --   HGB 9.0* 13.8  HCT 29.2* 40.4  MCV 106.2* 90.2  PLT 384 0000000    Basic Metabolic Panel: Recent Labs  Lab 11/06/2019 1944 11/25/19 0811  NA 153* 154*  K 4.8 5.2*  CL 120* 129*  CO2 21* 17*  GLUCOSE 138* 153*  BUN 104* 96*  CREATININE 2.54* 2.18*  CALCIUM 8.6* 7.5*   GFR: CrCl cannot be calculated (Unknown ideal weight.). Recent Labs  Lab 11/10/2019 1944 11/22/2019 1948 11/25/19 0811  WBC 12.4*  --  13.5*  LATICACIDVEN  --  3.0* 1.5    Liver Function Tests: Recent Labs  Lab 11/27/2019 1944  AST 16  ALT <5  ALKPHOS 70  BILITOT 0.7  PROT 7.0  ALBUMIN 2.5*   No results for input(s): LIPASE, AMYLASE in the last 168 hours. No results for input(s): AMMONIA in the last 168 hours.  ABG    Component Value Date/Time   PHART 7.385 02/18/2014 0904   PCO2ART 42.2 02/18/2014 0904   PO2ART 61.0 (L) 02/18/2014 0904   HCO3 27.2 02/27/2018 0056   TCO2 29 02/27/2018 0056   ACIDBASEDEF 1.0 02/18/2014 0908   O2SAT 24.0 02/27/2018 0056     Coagulation Profile: Recent Labs  Lab 11/01/2019 1944  INR 1.2    Cardiac Enzymes: No results for input(s): CKTOTAL, CKMB, CKMBINDEX, TROPONINI in the last 168 hours.  HbA1C: No results found for: HGBA1C  CBG: No results for input(s): GLUCAP in the last 168 hours.    Eliseo Gum MSN, AGACNP-BC [place] [id number] If no answer, [id number] 2019/12/09, 7:57 AM

## 2019-11-28 DEATH — deceased
